# Patient Record
Sex: Male | Born: 1935 | Race: White | Hispanic: No | Marital: Married | Smoking: Former smoker
Health system: Southern US, Community
[De-identification: ages and names within clinical notes are randomized; demographics above are authoritative.]

## PROBLEM LIST (undated history)

## (undated) DIAGNOSIS — Z5320 Procedure and treatment not carried out because of patient's decision for unspecified reasons: Secondary | ICD-10-CM

## (undated) DIAGNOSIS — E119 Type 2 diabetes mellitus without complications: Secondary | ICD-10-CM

## (undated) DIAGNOSIS — E1169 Type 2 diabetes mellitus with other specified complication: Secondary | ICD-10-CM

## (undated) DIAGNOSIS — K5903 Drug induced constipation: Secondary | ICD-10-CM

## (undated) DIAGNOSIS — K219 Gastro-esophageal reflux disease without esophagitis: Secondary | ICD-10-CM

## (undated) DIAGNOSIS — R0683 Snoring: Secondary | ICD-10-CM

## (undated) DIAGNOSIS — N400 Enlarged prostate without lower urinary tract symptoms: Secondary | ICD-10-CM

## (undated) DIAGNOSIS — A809 Acute poliomyelitis, unspecified: Secondary | ICD-10-CM

## (undated) DIAGNOSIS — Z9119 Patient's noncompliance with other medical treatment and regimen: Secondary | ICD-10-CM

## (undated) DIAGNOSIS — M1990 Unspecified osteoarthritis, unspecified site: Secondary | ICD-10-CM

## (undated) DIAGNOSIS — S83209A Unspecified tear of unspecified meniscus, current injury, unspecified knee, initial encounter: Secondary | ICD-10-CM

## (undated) DIAGNOSIS — Z9289 Personal history of other medical treatment: Secondary | ICD-10-CM

## (undated) DIAGNOSIS — H269 Unspecified cataract: Secondary | ICD-10-CM

## (undated) DIAGNOSIS — E039 Hypothyroidism, unspecified: Secondary | ICD-10-CM

## (undated) DIAGNOSIS — I10 Essential (primary) hypertension: Secondary | ICD-10-CM

## (undated) DIAGNOSIS — R351 Nocturia: Secondary | ICD-10-CM

## (undated) DIAGNOSIS — E785 Hyperlipidemia, unspecified: Secondary | ICD-10-CM

## (undated) HISTORY — PX: TONSILLECTOMY: SUR1361

## (undated) HISTORY — PX: MENISCECTOMY: SHX123

## (undated) HISTORY — DX: Hypothyroidism, unspecified: E03.9

## (undated) HISTORY — DX: Unspecified tear of unspecified meniscus, current injury, unspecified knee, initial encounter: S83.209A

## (undated) HISTORY — DX: Personal history of other medical treatment: Z92.89

## (undated) HISTORY — DX: Acute poliomyelitis, unspecified: A80.9

## (undated) HISTORY — DX: Unspecified cataract: H26.9

## (undated) HISTORY — DX: Hyperlipidemia, unspecified: E78.5

## (undated) HISTORY — DX: Snoring: R06.83

## (undated) HISTORY — PX: VASECTOMY: SHX75

## (undated) HISTORY — PX: LIPOMA EXCISION: SHX5283

## (undated) HISTORY — PX: COLONOSCOPY: SHX174

---
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
## 1957-03-01 HISTORY — PX: WRIST SURGERY: SHX841

## 1993-03-01 HISTORY — PX: OTHER SURGICAL HISTORY: SHX169

## 1994-03-01 HISTORY — PX: UVULECTOMY: SHX2631

## 1998-06-10 ENCOUNTER — Ambulatory Visit (HOSPITAL_BASED_OUTPATIENT_CLINIC_OR_DEPARTMENT_OTHER): Admission: RE | Admit: 1998-06-10 | Discharge: 1998-06-10 | Payer: Self-pay | Admitting: Orthopedic Surgery

## 2000-03-01 HISTORY — PX: INGUINAL HERNIA REPAIR: SUR1180

## 2001-03-01 HISTORY — PX: EYE SURGERY: SHX253

## 2001-09-29 HISTORY — PX: CARDIAC CATHETERIZATION: SHX172

## 2001-11-10 ENCOUNTER — Ambulatory Visit (HOSPITAL_COMMUNITY): Admission: RE | Admit: 2001-11-10 | Discharge: 2001-11-10 | Payer: Self-pay | Admitting: Cardiovascular Disease

## 2002-04-03 LAB — HM COLONOSCOPY: HM Colonoscopy: NORMAL

## 2002-05-28 ENCOUNTER — Ambulatory Visit (HOSPITAL_COMMUNITY): Admission: RE | Admit: 2002-05-28 | Discharge: 2002-05-28 | Payer: Self-pay | Admitting: *Deleted

## 2002-05-28 ENCOUNTER — Encounter (INDEPENDENT_AMBULATORY_CARE_PROVIDER_SITE_OTHER): Payer: Self-pay | Admitting: Specialist

## 2003-11-11 ENCOUNTER — Ambulatory Visit (HOSPITAL_BASED_OUTPATIENT_CLINIC_OR_DEPARTMENT_OTHER): Admission: RE | Admit: 2003-11-11 | Discharge: 2003-11-11 | Payer: Self-pay | Admitting: *Deleted

## 2004-01-07 ENCOUNTER — Ambulatory Visit: Payer: Self-pay | Admitting: Internal Medicine

## 2004-01-20 ENCOUNTER — Ambulatory Visit: Payer: Self-pay | Admitting: Internal Medicine

## 2004-03-12 ENCOUNTER — Ambulatory Visit: Payer: Self-pay | Admitting: Internal Medicine

## 2004-03-27 ENCOUNTER — Ambulatory Visit (HOSPITAL_BASED_OUTPATIENT_CLINIC_OR_DEPARTMENT_OTHER): Admission: RE | Admit: 2004-03-27 | Discharge: 2004-03-27 | Payer: Self-pay | Admitting: *Deleted

## 2004-04-23 ENCOUNTER — Ambulatory Visit (HOSPITAL_COMMUNITY): Admission: RE | Admit: 2004-04-23 | Discharge: 2004-04-23 | Payer: Self-pay | Admitting: Orthopedic Surgery

## 2004-07-13 ENCOUNTER — Ambulatory Visit: Payer: Self-pay | Admitting: Internal Medicine

## 2004-07-16 ENCOUNTER — Ambulatory Visit: Payer: Self-pay | Admitting: Internal Medicine

## 2004-10-21 ENCOUNTER — Ambulatory Visit: Payer: Self-pay | Admitting: Internal Medicine

## 2004-11-23 ENCOUNTER — Encounter: Admission: RE | Admit: 2004-11-23 | Discharge: 2004-11-23 | Payer: Self-pay | Admitting: Rheumatology

## 2004-12-07 ENCOUNTER — Ambulatory Visit: Payer: Self-pay | Admitting: Internal Medicine

## 2005-02-19 ENCOUNTER — Ambulatory Visit: Payer: Self-pay | Admitting: Internal Medicine

## 2005-04-13 ENCOUNTER — Ambulatory Visit: Payer: Self-pay | Admitting: Internal Medicine

## 2005-04-29 ENCOUNTER — Ambulatory Visit: Payer: Self-pay | Admitting: Internal Medicine

## 2005-05-10 ENCOUNTER — Ambulatory Visit: Payer: Self-pay | Admitting: Internal Medicine

## 2005-05-25 ENCOUNTER — Ambulatory Visit: Payer: Self-pay | Admitting: Internal Medicine

## 2005-07-30 ENCOUNTER — Ambulatory Visit: Payer: Self-pay | Admitting: Internal Medicine

## 2005-09-08 ENCOUNTER — Ambulatory Visit: Payer: Self-pay | Admitting: Internal Medicine

## 2006-03-17 ENCOUNTER — Ambulatory Visit: Payer: Self-pay | Admitting: Internal Medicine

## 2006-03-17 LAB — CONVERTED CEMR LAB
ALT: 31 units/L (ref 0–40)
AST: 26 units/L (ref 0–37)
Cholesterol: 128 mg/dL (ref 0–200)
HDL: 40.1 mg/dL (ref >39.0)
Hgb A1c MFr Bld: 6.4 % — ABNORMAL HIGH (ref 4.6–6.0)
LDL Cholesterol: 67 mg/dL (ref 0–99)
Total CHOL/HDL Ratio: 3.2
Triglycerides: 103 mg/dL (ref 0–149)
VLDL: 21 mg/dL (ref 0–40)

## 2006-03-21 ENCOUNTER — Ambulatory Visit: Payer: Self-pay | Admitting: Internal Medicine

## 2006-03-29 ENCOUNTER — Ambulatory Visit: Payer: Self-pay | Admitting: Internal Medicine

## 2006-04-20 DIAGNOSIS — E039 Hypothyroidism, unspecified: Secondary | ICD-10-CM | POA: Insufficient documentation

## 2006-07-22 ENCOUNTER — Ambulatory Visit: Payer: Self-pay | Admitting: Internal Medicine

## 2006-07-22 LAB — CONVERTED CEMR LAB
BUN: 8 mg/dL (ref 6–23)
Cholesterol: 148 mg/dL (ref 0–200)
Creatinine, Ser: 1 mg/dL (ref 0.4–1.5)
Creatinine,U: 44.3 mg/dL
HDL: 40.5 mg/dL (ref >39.0)
Hgb A1c MFr Bld: 6.6 % — ABNORMAL HIGH (ref 4.6–6.0)
LDL Cholesterol: 82 mg/dL (ref 0–99)
Microalb Creat Ratio: 4.5 mg/g (ref 0.0–30.0)
Microalb, Ur: 0.2 mg/dL (ref 0.0–1.9)
TSH: 1.65 microintl units/mL (ref 0.35–5.50)
Total CHOL/HDL Ratio: 3.7
Triglycerides: 128 mg/dL (ref 0–149)
VLDL: 26 mg/dL (ref 0–40)

## 2006-07-27 ENCOUNTER — Ambulatory Visit (HOSPITAL_BASED_OUTPATIENT_CLINIC_OR_DEPARTMENT_OTHER): Admission: RE | Admit: 2006-07-27 | Discharge: 2006-07-27 | Payer: Self-pay | Admitting: Urology

## 2006-07-31 HISTORY — PX: PROSTATE SURGERY: SHX751

## 2006-08-23 ENCOUNTER — Ambulatory Visit: Payer: Self-pay | Admitting: Internal Medicine

## 2006-09-27 ENCOUNTER — Encounter: Payer: Self-pay | Admitting: Internal Medicine

## 2006-10-18 ENCOUNTER — Ambulatory Visit: Payer: Self-pay | Admitting: Internal Medicine

## 2006-10-18 LAB — CONVERTED CEMR LAB: Hgb A1c MFr Bld: 6.2 % — ABNORMAL HIGH (ref 4.6–6.0)

## 2006-10-19 ENCOUNTER — Encounter (INDEPENDENT_AMBULATORY_CARE_PROVIDER_SITE_OTHER): Payer: Self-pay | Admitting: *Deleted

## 2006-10-20 ENCOUNTER — Encounter: Payer: Self-pay | Admitting: Internal Medicine

## 2006-10-25 ENCOUNTER — Ambulatory Visit: Payer: Self-pay | Admitting: Internal Medicine

## 2006-10-25 DIAGNOSIS — T887XXA Unspecified adverse effect of drug or medicament, initial encounter: Secondary | ICD-10-CM | POA: Insufficient documentation

## 2006-10-31 LAB — CONVERTED CEMR LAB
ALT: 22 units/L (ref 0–53)
AST: 22 units/L (ref 0–37)
Albumin: 4.5 g/dL (ref 3.5–5.2)
Alkaline Phosphatase: 90 units/L (ref 39–117)
Bilirubin, Direct: 0.1 mg/dL (ref 0.0–0.3)
Total Bilirubin: 0.8 mg/dL (ref 0.3–1.2)
Total Protein: 8.2 g/dL (ref 6.0–8.3)

## 2006-11-01 ENCOUNTER — Encounter (INDEPENDENT_AMBULATORY_CARE_PROVIDER_SITE_OTHER): Payer: Self-pay | Admitting: *Deleted

## 2006-11-29 ENCOUNTER — Encounter: Payer: Self-pay | Admitting: Internal Medicine

## 2006-11-30 ENCOUNTER — Encounter: Payer: Self-pay | Admitting: Internal Medicine

## 2006-12-14 ENCOUNTER — Telehealth (INDEPENDENT_AMBULATORY_CARE_PROVIDER_SITE_OTHER): Payer: Self-pay | Admitting: *Deleted

## 2007-03-23 ENCOUNTER — Telehealth (INDEPENDENT_AMBULATORY_CARE_PROVIDER_SITE_OTHER): Payer: Self-pay | Admitting: *Deleted

## 2007-04-05 ENCOUNTER — Ambulatory Visit: Payer: Self-pay | Admitting: Internal Medicine

## 2007-04-06 ENCOUNTER — Ambulatory Visit: Payer: Self-pay | Admitting: Internal Medicine

## 2007-04-06 DIAGNOSIS — T7841XA Arthus phenomenon, initial encounter: Secondary | ICD-10-CM | POA: Insufficient documentation

## 2007-04-14 ENCOUNTER — Encounter (INDEPENDENT_AMBULATORY_CARE_PROVIDER_SITE_OTHER): Payer: Self-pay | Admitting: *Deleted

## 2007-04-14 LAB — CONVERTED CEMR LAB
ALT: 21 units/L (ref 0–53)
AST: 23 units/L (ref 0–37)
Albumin: 4 g/dL (ref 3.5–5.2)
Alkaline Phosphatase: 75 units/L (ref 39–117)
BUN: 10 mg/dL (ref 6–23)
Bilirubin, Direct: 0.1 mg/dL (ref 0.0–0.3)
Cholesterol: 127 mg/dL (ref 0–200)
Creatinine, Ser: 1.1 mg/dL (ref 0.4–1.5)
Creatinine,U: 97.6 mg/dL
HDL: 35.8 mg/dL — ABNORMAL LOW (ref >39.0)
Hgb A1c MFr Bld: 6.3 % — ABNORMAL HIGH (ref 4.6–6.0)
LDL Cholesterol: 65 mg/dL (ref 0–99)
Microalb Creat Ratio: 12.3 mg/g (ref 0.0–30.0)
Microalb, Ur: 1.2 mg/dL (ref 0.0–1.9)
Potassium: 5.6 meq/L — ABNORMAL HIGH (ref 3.5–5.1)
TSH: 1.74 microintl units/mL (ref 0.35–5.50)
Total Bilirubin: 1.1 mg/dL (ref 0.3–1.2)
Total CHOL/HDL Ratio: 3.5
Total Protein: 7.2 g/dL (ref 6.0–8.3)
Triglycerides: 129 mg/dL (ref 0–149)
VLDL: 26 mg/dL (ref 0–40)

## 2007-04-18 DIAGNOSIS — N400 Enlarged prostate without lower urinary tract symptoms: Secondary | ICD-10-CM | POA: Insufficient documentation

## 2007-04-19 ENCOUNTER — Ambulatory Visit: Payer: Self-pay | Admitting: Internal Medicine

## 2007-04-19 DIAGNOSIS — Z9189 Other specified personal risk factors, not elsewhere classified: Secondary | ICD-10-CM | POA: Insufficient documentation

## 2007-04-19 DIAGNOSIS — E1169 Type 2 diabetes mellitus with other specified complication: Secondary | ICD-10-CM

## 2007-04-19 DIAGNOSIS — E119 Type 2 diabetes mellitus without complications: Secondary | ICD-10-CM | POA: Insufficient documentation

## 2007-04-19 DIAGNOSIS — E785 Hyperlipidemia, unspecified: Secondary | ICD-10-CM

## 2007-04-19 HISTORY — DX: Hyperlipidemia, unspecified: E11.69

## 2007-07-11 ENCOUNTER — Telehealth (INDEPENDENT_AMBULATORY_CARE_PROVIDER_SITE_OTHER): Payer: Self-pay | Admitting: *Deleted

## 2007-07-18 ENCOUNTER — Ambulatory Visit: Payer: Self-pay | Admitting: Internal Medicine

## 2007-07-19 ENCOUNTER — Ambulatory Visit: Payer: Self-pay | Admitting: Internal Medicine

## 2007-07-19 LAB — CONVERTED CEMR LAB
ALT: 20 units/L (ref 0–53)
AST: 24 units/L (ref 0–37)
Albumin: 3.9 g/dL (ref 3.5–5.2)
Alkaline Phosphatase: 90 units/L (ref 39–117)
BUN: 10 mg/dL (ref 6–23)
Bilirubin, Direct: 0.1 mg/dL (ref 0.0–0.3)
Creatinine, Ser: 1 mg/dL (ref 0.4–1.5)
Hgb A1c MFr Bld: 6.2 % — ABNORMAL HIGH (ref 4.6–6.0)
Potassium: 5.1 meq/L (ref 3.5–5.1)
Total Bilirubin: 0.6 mg/dL (ref 0.3–1.2)
Total Protein: 7.1 g/dL (ref 6.0–8.3)

## 2007-07-20 ENCOUNTER — Encounter: Payer: Self-pay | Admitting: Internal Medicine

## 2007-08-18 ENCOUNTER — Encounter: Payer: Self-pay | Admitting: Internal Medicine

## 2007-08-22 ENCOUNTER — Telehealth (INDEPENDENT_AMBULATORY_CARE_PROVIDER_SITE_OTHER): Payer: Self-pay | Admitting: *Deleted

## 2007-08-31 ENCOUNTER — Telehealth: Payer: Self-pay | Admitting: Internal Medicine

## 2007-09-08 ENCOUNTER — Encounter: Payer: Self-pay | Admitting: Internal Medicine

## 2007-10-09 ENCOUNTER — Ambulatory Visit: Payer: Self-pay | Admitting: Internal Medicine

## 2007-10-14 LAB — CONVERTED CEMR LAB: Hgb A1c MFr Bld: 6.6 % — ABNORMAL HIGH (ref 4.6–6.0)

## 2007-10-16 ENCOUNTER — Encounter (INDEPENDENT_AMBULATORY_CARE_PROVIDER_SITE_OTHER): Payer: Self-pay | Admitting: *Deleted

## 2007-11-02 ENCOUNTER — Ambulatory Visit: Payer: Self-pay | Admitting: Internal Medicine

## 2007-11-23 ENCOUNTER — Encounter: Payer: Self-pay | Admitting: Internal Medicine

## 2008-01-18 ENCOUNTER — Encounter: Payer: Self-pay | Admitting: Internal Medicine

## 2008-02-08 ENCOUNTER — Ambulatory Visit: Payer: Self-pay | Admitting: Internal Medicine

## 2008-02-11 LAB — CONVERTED CEMR LAB
Creatinine,U: 85.4 mg/dL
Hgb A1c MFr Bld: 6.4 % — ABNORMAL HIGH (ref 4.6–6.0)
Microalb Creat Ratio: 2.3 mg/g (ref 0.0–30.0)
Microalb, Ur: 0.2 mg/dL (ref 0.0–1.9)

## 2008-02-12 ENCOUNTER — Encounter (INDEPENDENT_AMBULATORY_CARE_PROVIDER_SITE_OTHER): Payer: Self-pay | Admitting: *Deleted

## 2008-02-19 ENCOUNTER — Telehealth: Payer: Self-pay | Admitting: Internal Medicine

## 2008-05-16 ENCOUNTER — Encounter: Payer: Self-pay | Admitting: Internal Medicine

## 2008-06-06 ENCOUNTER — Encounter: Payer: Self-pay | Admitting: Internal Medicine

## 2008-06-12 ENCOUNTER — Ambulatory Visit: Payer: Self-pay | Admitting: Internal Medicine

## 2008-06-12 DIAGNOSIS — L570 Actinic keratosis: Secondary | ICD-10-CM | POA: Insufficient documentation

## 2008-06-18 ENCOUNTER — Telehealth (INDEPENDENT_AMBULATORY_CARE_PROVIDER_SITE_OTHER): Payer: Self-pay | Admitting: *Deleted

## 2008-06-18 LAB — CONVERTED CEMR LAB
BUN: 13 mg/dL (ref 6–23)
Creatinine, Ser: 1 mg/dL (ref 0.4–1.5)
Hgb A1c MFr Bld: 6.4 % (ref 4.6–6.5)
Potassium: 5.3 meq/L — ABNORMAL HIGH (ref 3.5–5.1)
TSH: 2.15 microintl units/mL (ref 0.35–5.50)

## 2008-07-12 ENCOUNTER — Encounter: Payer: Self-pay | Admitting: Internal Medicine

## 2008-10-31 ENCOUNTER — Encounter: Payer: Self-pay | Admitting: Internal Medicine

## 2008-11-05 ENCOUNTER — Telehealth: Payer: Self-pay | Admitting: Internal Medicine

## 2008-11-08 ENCOUNTER — Ambulatory Visit: Payer: Self-pay | Admitting: Internal Medicine

## 2008-11-10 LAB — CONVERTED CEMR LAB
BUN: 13 mg/dL (ref 6–23)
Creatinine, Ser: 1.1 mg/dL (ref 0.4–1.5)
Creatinine,U: 260.2 mg/dL
Hgb A1c MFr Bld: 6.3 % (ref 4.6–6.5)
Microalb Creat Ratio: 2.7 mg/g (ref 0.0–30.0)
Microalb, Ur: 0.7 mg/dL (ref 0.0–1.9)
Potassium: 4.6 meq/L (ref 3.5–5.1)
TSH: 2.18 microintl units/mL (ref 0.35–5.50)

## 2008-11-11 ENCOUNTER — Encounter (INDEPENDENT_AMBULATORY_CARE_PROVIDER_SITE_OTHER): Payer: Self-pay | Admitting: *Deleted

## 2008-11-19 ENCOUNTER — Ambulatory Visit: Payer: Self-pay | Admitting: Internal Medicine

## 2008-12-06 ENCOUNTER — Encounter: Payer: Self-pay | Admitting: Internal Medicine

## 2009-01-03 ENCOUNTER — Encounter: Payer: Self-pay | Admitting: Internal Medicine

## 2009-02-07 ENCOUNTER — Telehealth (INDEPENDENT_AMBULATORY_CARE_PROVIDER_SITE_OTHER): Payer: Self-pay | Admitting: *Deleted

## 2009-02-11 ENCOUNTER — Ambulatory Visit: Payer: Self-pay | Admitting: Internal Medicine

## 2009-02-16 LAB — CONVERTED CEMR LAB: Hgb A1c MFr Bld: 6.3 % (ref 4.6–6.5)

## 2009-02-17 ENCOUNTER — Encounter (INDEPENDENT_AMBULATORY_CARE_PROVIDER_SITE_OTHER): Payer: Self-pay | Admitting: *Deleted

## 2009-03-01 HISTORY — PX: BACK SURGERY: SHX140

## 2009-04-03 ENCOUNTER — Encounter: Payer: Self-pay | Admitting: Internal Medicine

## 2009-04-07 ENCOUNTER — Telehealth (INDEPENDENT_AMBULATORY_CARE_PROVIDER_SITE_OTHER): Payer: Self-pay | Admitting: *Deleted

## 2009-04-14 ENCOUNTER — Encounter: Payer: Self-pay | Admitting: Internal Medicine

## 2009-04-15 ENCOUNTER — Telehealth (INDEPENDENT_AMBULATORY_CARE_PROVIDER_SITE_OTHER): Payer: Self-pay | Admitting: *Deleted

## 2009-04-16 ENCOUNTER — Ambulatory Visit: Payer: Self-pay | Admitting: Internal Medicine

## 2009-04-17 ENCOUNTER — Telehealth (INDEPENDENT_AMBULATORY_CARE_PROVIDER_SITE_OTHER): Payer: Self-pay | Admitting: *Deleted

## 2009-04-21 ENCOUNTER — Ambulatory Visit: Payer: Self-pay | Admitting: Internal Medicine

## 2009-04-22 ENCOUNTER — Ambulatory Visit: Payer: Self-pay | Admitting: Internal Medicine

## 2009-04-22 LAB — CONVERTED CEMR LAB
ALT: 19 units/L (ref 0–53)
AST: 21 units/L (ref 0–37)
Albumin: 4 g/dL (ref 3.5–5.2)
Alkaline Phosphatase: 56 units/L (ref 39–117)
Basophils Absolute: 0 10*3/uL (ref 0.0–0.1)
Basophils Relative: 0.5 % (ref 0.0–3.0)
Bilirubin Urine: NEGATIVE
Bilirubin, Direct: 0.2 mg/dL (ref 0.0–0.3)
Blood in Urine, dipstick: NEGATIVE
Cholesterol: 138 mg/dL (ref 0–200)
Creatinine, Ser: 0.9 mg/dL (ref 0.4–1.5)
Eosinophils Absolute: 0.2 10*3/uL (ref 0.0–0.7)
Eosinophils Relative: 3.3 % (ref 0.0–5.0)
Glucose, Bld: 112 mg/dL — ABNORMAL HIGH (ref 70–99)
Glucose, Urine, Semiquant: NEGATIVE
HCT: 39.2 % (ref 39.0–52.0)
HDL: 48.7 mg/dL (ref >39.00)
Hemoglobin: 13.1 g/dL (ref 13.0–17.0)
Ketones, urine, test strip: NEGATIVE
LDL Cholesterol: 67 mg/dL (ref 0–99)
Lymphocytes Relative: 24.7 % (ref 12.0–46.0)
Lymphs Abs: 1.2 10*3/uL (ref 0.7–4.0)
MCHC: 33.3 g/dL (ref 30.0–36.0)
MCV: 91 fL (ref 78.0–100.0)
Monocytes Absolute: 0.4 10*3/uL (ref 0.1–1.0)
Monocytes Relative: 8 % (ref 3.0–12.0)
Neutro Abs: 3.2 10*3/uL (ref 1.4–7.7)
Neutrophils Relative %: 63.5 % (ref 43.0–77.0)
Nitrite: NEGATIVE
Platelets: 223 10*3/uL (ref 150.0–400.0)
Protein, U semiquant: NEGATIVE
RBC: 4.3 M/uL (ref 4.22–5.81)
RDW: 11.9 % (ref 11.5–14.6)
Specific Gravity, Urine: 1.01
Total Bilirubin: 0.5 mg/dL (ref 0.3–1.2)
Total CHOL/HDL Ratio: 3
Total Protein: 7.2 g/dL (ref 6.0–8.3)
Triglycerides: 111 mg/dL (ref 0.0–149.0)
Urobilinogen, UA: 0.2
VLDL: 22.2 mg/dL (ref 0.0–40.0)
WBC Urine, dipstick: NEGATIVE
WBC: 5 10*3/uL (ref 4.5–10.5)
pH: 6

## 2009-04-29 ENCOUNTER — Ambulatory Visit (HOSPITAL_COMMUNITY): Admission: RE | Admit: 2009-04-29 | Discharge: 2009-04-30 | Payer: Self-pay | Admitting: Neurosurgery

## 2009-04-29 ENCOUNTER — Encounter: Payer: Self-pay | Admitting: Internal Medicine

## 2009-05-14 ENCOUNTER — Encounter: Payer: Self-pay | Admitting: Internal Medicine

## 2009-05-29 ENCOUNTER — Telehealth (INDEPENDENT_AMBULATORY_CARE_PROVIDER_SITE_OTHER): Payer: Self-pay | Admitting: *Deleted

## 2009-06-02 ENCOUNTER — Encounter: Payer: Self-pay | Admitting: Internal Medicine

## 2009-06-25 ENCOUNTER — Encounter: Payer: Self-pay | Admitting: Internal Medicine

## 2009-08-28 ENCOUNTER — Ambulatory Visit: Payer: Self-pay | Admitting: Internal Medicine

## 2009-08-29 LAB — CONVERTED CEMR LAB
Creatinine,U: 80.5 mg/dL
Hgb A1c MFr Bld: 6.4 % (ref 4.6–6.5)
Microalb Creat Ratio: 1.5 mg/g (ref 0.0–30.0)
Microalb, Ur: 1.2 mg/dL (ref 0.0–1.9)

## 2009-10-21 ENCOUNTER — Telehealth (INDEPENDENT_AMBULATORY_CARE_PROVIDER_SITE_OTHER): Payer: Self-pay | Admitting: *Deleted

## 2009-10-27 ENCOUNTER — Encounter: Payer: Self-pay | Admitting: Internal Medicine

## 2009-12-12 ENCOUNTER — Encounter: Payer: Self-pay | Admitting: Internal Medicine

## 2009-12-14 ENCOUNTER — Emergency Department (HOSPITAL_COMMUNITY): Admission: EM | Admit: 2009-12-14 | Discharge: 2009-12-14 | Payer: Self-pay | Admitting: Family Medicine

## 2009-12-15 ENCOUNTER — Encounter: Payer: Self-pay | Admitting: Internal Medicine

## 2010-01-12 ENCOUNTER — Telehealth (INDEPENDENT_AMBULATORY_CARE_PROVIDER_SITE_OTHER): Payer: Self-pay | Admitting: *Deleted

## 2010-01-29 ENCOUNTER — Telehealth (INDEPENDENT_AMBULATORY_CARE_PROVIDER_SITE_OTHER): Payer: Self-pay | Admitting: *Deleted

## 2010-02-02 ENCOUNTER — Ambulatory Visit: Payer: Self-pay | Admitting: Internal Medicine

## 2010-02-03 LAB — CONVERTED CEMR LAB
Hgb A1c MFr Bld: 6.6 % — ABNORMAL HIGH (ref 4.6–6.5)
Potassium: 5.5 meq/L — ABNORMAL HIGH (ref 3.5–5.1)
Sodium: 136 meq/L (ref 135–145)
TSH: 2.09 microintl units/mL (ref 0.35–5.50)

## 2010-02-12 ENCOUNTER — Ambulatory Visit: Payer: Self-pay | Admitting: Internal Medicine

## 2010-02-13 DIAGNOSIS — E876 Hypokalemia: Secondary | ICD-10-CM | POA: Insufficient documentation

## 2010-02-24 ENCOUNTER — Encounter: Payer: Self-pay | Admitting: Internal Medicine

## 2010-03-01 DIAGNOSIS — Z9289 Personal history of other medical treatment: Secondary | ICD-10-CM

## 2010-03-01 HISTORY — DX: Personal history of other medical treatment: Z92.89

## 2010-03-01 HISTORY — PX: JOINT REPLACEMENT: SHX530

## 2010-03-11 ENCOUNTER — Telehealth (INDEPENDENT_AMBULATORY_CARE_PROVIDER_SITE_OTHER): Payer: Self-pay | Admitting: *Deleted

## 2010-03-26 ENCOUNTER — Encounter: Payer: Self-pay | Admitting: Internal Medicine

## 2010-04-02 NOTE — Letter (Signed)
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

## 2010-04-02 NOTE — Letter (Signed)
Summary: [facility]  [facility]   Imported By: Lanelle Bal 02/02/2008 09:28:30  _____________________________________________________________________  External Attachment:    Type:   Image     Comment:   External Document

## 2010-04-02 NOTE — Letter (Signed)
Summary: [facility]  [facility]   Imported By: Lanelle Bal 05/31/2009 10:42:31  _____________________________________________________________________  External Attachment:    Type:   Image     Comment:   External Document

## 2010-04-02 NOTE — Consult Note (Signed)
Summary: [facility]  [facility]   Imported By: Lanelle Bal 09/15/2007 11:33:26  _____________________________________________________________________  External Attachment:    Type:   Image     Comment:   External Document  Appended Document: [facility] Dr Corliss Skains : Hyalgan injections; osteoporosis

## 2010-04-02 NOTE — Assessment & Plan Note (Signed)
Summary: DISCUSS LABS/CDJ   Vital Signs:  Patient Profile:   77 Years Old Male Height:     71 inches Weight:      198.2 pounds Pulse rate:   57 / minute Resp:     12 per minute BP sitting:   118 / 64  (left arm) Cuff size:   large  Vitals Entered By: Shonna Chock (November 02, 2007 2:45 PM)                 Chief Complaint:  1.) FOLLOW-UP ON LABS 2.) LOOK @ SPOT ON RIGHT ARM X 5 DAYS 3.) REFILL SYNTHROID AND METFORMIN 4.) ? WHEN SHOULD LIPIDS BE RECHECKED and Type 2 diabetes mellitus follow-up.  History of Present Illness: The A1c was 6.6% on 10/09/07; FBS average 110 & 2 hr pc < 140.No steroid injections ,but he received Synvas.He has name of Podiatrist, but has not been. Now exercising 5X /week w/o symptoms  Type 2 Diabetes Mellitus Follow-Up      This is a 76 year old man who presents for Type 2 diabetes mellitus follow-up.  The patient denies polyuria, polydipsia, blurred vision, self managed hypoglycemia, hypoglycemia requiring help, weight loss, weight gain, and numbness of extremities.  The patient denies the following symptoms: neuropathic pain, chest pain, vomiting, orthostatic symptoms, poor wound healing, intermittent claudication, vision loss, and foot ulcer.  Since the last visit the patient reports good dietary compliance, compliance with medications, exercising regularly, and monitoring blood glucose.  The patient has been measuring capillary blood glucose before breakfast, before lunch, and after dinner.  Since the last visit, the patient reports having had eye care by an ophthalmologist and no foot care.  Off [facility] as of several months ago.    Current Allergies (reviewed today): ! * ANTIHISTAMINES ! ZOCOR      Physical Exam  General:     well-nourished,in no acute distress; alert,appropriate and cooperative throughout examination Heart:     Normal rate and regular rhythm. S1 and S2 normal without gallop, murmur, click, rub .S4 with slurring Pulses:    R and L carotid,radial,dorsalis pedis and posterior tibial pulses are full and equal bilaterally Skin:     Bruise Rforearm; pigmented flat lesions bilat on arms ("unchanged for yrs, but wife concerned")    Impression & Recommendations:  Problem # 1:  DIABETES MELLITUS, TYPE II (ICD-250.00)  His updated medication list for this problem includes:    Adult Aspirin Low Strength 81 Mg Tbdp (Aspirin) .Marland Kitchen... 1 by mouth qd    Metformin Hcl 500 Mg Tb24 (Metformin hcl) .Marland Kitchen... 1 by mouth with the largest meal   Problem # 2:  HYPOTHYROIDISM (ICD-244.9)  His updated medication list for this problem includes:    Synthroid 150 Mcg Tabs (Levothyroxine sodium) .Marland Kitchen... 1 by mouth qd   Complete Medication List: 1)  Synthroid 150 Mcg Tabs (Levothyroxine sodium) .Marland Kitchen.. 1 by mouth qd 2)  Flomax 0.4 Mg Cp24 (Tamsulosin hcl) .Marland Kitchen.. 1 by mouth qd 3)  Adult Aspirin Low Strength 81 Mg Tbdp (Aspirin) .Marland Kitchen.. 1 by mouth qd 4)  Crestor 5 Mg Tabs (Rosuvastatin calcium) .Marland Kitchen.. 1 by mouth qd 5)  Celebrex 200 Mg Caps (Celecoxib) .Marland Kitchen.. 1 by mouth once daily prn 6)  Metformin Hcl 500 Mg Tb24 (Metformin hcl) .Marland Kitchen.. 1 by mouth with the largest meal 7)  Precision Xtra Blood Glucose Strp (Glucose blood) .... Check one time daily as directed 8)  Centrum Silver Tabs (Multiple vitamins-minerals) .... Take 1 tablet  by mouth once a day 9)  Cvs Vitamin E 200 Unit Caps (Vitamin e) .... Take 1 capsule by mouth once a day 10)  Vitamin B-12 1000 Mcg Tabs (Cyanocobalamin) .... Take 1 tablet by mouth once a day 11)  Calcium-magnesium-zinc 1000-400-15 Mg Tabs (Calcium-magnesium-zinc) .... Take 1 tablet by mouth once a day 12)  Glucosamine Sulfate 1000 Mg Tabs (Glucosamine sulfate) .... Take 1 tablet by mouth once a day 13)  Eql Fish Oil 1000 Mg Caps (Omega-3 fatty acids) .... Take 1 tablet two times a day by mouth 14)  Potassium 90 Mg  .Marland KitchenMarland Kitchen. 1 by mouth once daily   Patient Instructions: 1)  Please schedule a follow-up appointment in 3  months. 2)  HbgA1C prior to visit, ICD-9:250.00 3)  Urine Microalbumin prior to visit, ICD-9:250.00   Prescriptions: CRESTOR 5 MG  TABS (ROSUVASTATIN CALCIUM) 1 by mouth qd  #90 x 1   Entered and Authorized by:   Marga Melnick MD   Signed by:   Marga Melnick MD on 11/02/2007   Method used:   Print then Give to Patient   RxID:   [id number] METFORMIN HCL 500 MG  TB24 (METFORMIN HCL) 1 by mouth with the largest meal  #90 x 1   Entered and Authorized by:   Marga Melnick MD   Signed by:   Marga Melnick MD on 11/02/2007   Method used:   Print then Give to Patient   RxID:   [id number] SYNTHROID 150 MCG  TABS (LEVOTHYROXINE SODIUM) 1 by mouth qd  #90 x 1   Entered and Authorized by:   Marga Melnick MD   Signed by:   Marga Melnick MD on 11/02/2007   Method used:   Print then Give to Patient   RxID:   [id number]  ]

## 2010-04-02 NOTE — Letter (Signed)
Summary: [facility]  [facility]   Imported By: Lanelle Bal 05/08/2009 13:57:03  _____________________________________________________________________  External Attachment:    Type:   Image     Comment:   External Document

## 2010-04-02 NOTE — Assessment & Plan Note (Signed)
Summary: 8 WEEK F/U//CA   Vital Signs:  Patient Profile:   77 Years Old Male Weight:      191.25 pounds Pulse rate:   52 / minute Pulse rhythm:   regular BP sitting:   120 / 88  (left arm) Cuff size:   large  Pt. in pain?   no  Vitals Entered By: Wendall Stade (October 25, 2006 10:36 AM)                Chief Complaint:  follow up labs and Type 2 diabetes mellitus follow-up.  History of Present Illness:  Type 2 Diabetes Mellitus Follow-Up      This is a 77 year old man who presents for Type 2 diabetes mellitus follow-up.  FBS 99- 140;pc 160-180; 13 # wt loss since 01/08. Knee issues preclude walking.  The patient reports weight gain, but denies polyuria, polydipsia, blurred vision, self managed hypoglycemia, hypoglycemia requiring help, weight loss, and numbness of extremities.  The patient denies the following symptoms: neuropathic pain, chest pain, vomiting, orthostatic symptoms, poor wound healing, intermittent claudication, vision loss, and foot ulcer.  Since the last visit the patient reports good dietary compliance, compliance with medications, not exercising regularly, and monitoring blood glucose.  The patient has been measuring capillary blood glucose before breakfast and before dinner.  Since the last visit, the patient reports having had eye care by an ophthalmologist and no foot care.   Monitor malfunctioning @ times.  Current Allergies (reviewed today): ! * ANTIHISTAMINES ! ZOCOR Updated/Current Medications (including changes made in today's visit):  SYNTHROID 150 MCG  TABS (LEVOTHYROXINE SODIUM) 1 by mouth qd FLOMAX 0.4 MG  CP24 (TAMSULOSIN HCL) 1 by mouth qd ADULT ASPIRIN LOW STRENGTH 81 MG  TBDP (ASPIRIN) 1 by mouth qd CRESTOR 5 MG  TABS (ROSUVASTATIN CALCIUM)  CELEBREX 200 MG  CAPS (CELECOXIB) 1 by mouth once daily prn METFORMIN HCL 500 MG  TABS (METFORMIN HCL) 1 once daily with largest meal       Physical Exam  General:  Well-developed,well-nourished,in no acute distress; alert,appropriate and cooperative throughout examination Heart:     Normal rate and regular rhythm. S1 and S2 normal without gallop, murmur, click, rub. S4 with slurring Pulses:     dorsalis pedis and posterior tibial pulses are full and equal bilaterally Extremities:     deformed toenails Neurologic:     decreased sensation soles; sensation intact to light touch and sensation intact to pinprick over dorsum feet.   Skin:     rubror of feet    Impression & Recommendations:  Problem # 1:  DIABETES-TYPE 2 (ICD-250.00)  His updated medication list for this problem includes:    Adult Aspirin Low Strength 81 Mg Tbdp (Aspirin) .Marland Kitchen... 1 by mouth qd    Metformin Hcl 500 Mg Tabs (Metformin hcl) .Marland Kitchen... 1 once daily with largest meal   Complete Medication List: 1)  Synthroid 150 Mcg Tabs (Levothyroxine sodium) .Marland Kitchen.. 1 by mouth qd 2)  Flomax 0.4 Mg Cp24 (Tamsulosin hcl) .Marland Kitchen.. 1 by mouth qd 3)  Adult Aspirin Low Strength 81 Mg Tbdp (Aspirin) .Marland Kitchen.. 1 by mouth qd 4)  Crestor 5 Mg Tabs (Rosuvastatin calcium) 5)  Celebrex 200 Mg Caps (Celecoxib) .Marland Kitchen.. 1 by mouth once daily prn 6)  Metformin Hcl 500 Mg Tabs (Metformin hcl) .Marland Kitchen.. 1 once daily with largest meal   Patient Instructions: 1)  Report any new symptoms in feet ; Podiatry consult would be scheduled.    Prescriptions: METFORMIN HCL 500  MG  TABS (METFORMIN HCL) 1 once daily with largest meal  #90 x 1   Entered and Authorized by:   Marga Melnick MD   Signed by:   Marga Melnick MD on 10/25/2006   Method used:   Print then Give to Patient   RxID:   [id number] CRESTOR 5 MG  TABS (ROSUVASTATIN CALCIUM)   #90 x 1   Entered and Authorized by:   Marga Melnick MD   Signed by:   Marga Melnick MD on 10/25/2006   Method used:   Print then Give to Patient   RxID:   [id number] SYNTHROID 150 MCG  TABS (LEVOTHYROXINE SODIUM) 1 by mouth qd  #90 x 1   Entered and Authorized by:   Marga Melnick MD   Signed by:   Marga Melnick MD on 10/25/2006   Method used:   Print then Give to Patient   RxID:   [id number]   Appended Document: Orders Update    Clinical Lists Changes  Orders: Added new Test order of TLB-Hepatic/Liver Function Pnl ([id number]) - Signed

## 2010-04-02 NOTE — Assessment & Plan Note (Signed)
Summary: FLIGHT CPX   Vital Signs:  Patient Profile:   77 Years Old Male Height:     71 inches Weight:      194.38 pounds Pulse rate:   60 / minute Pulse rhythm:   regular Resp:     17 per minute BP sitting:   120 / 80  (left arm) Cuff size:   large  Pt. in pain?   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  History of Present Illness: A1c was 6.3% on Metformin 500 mg once daily; A1c was 6.6% in 5/08 not on Metformin. FBS averaging 100. Two hour pc glucoses average 125. NO  hypoglycemia. Goal  discussed ( A1c < 6.5% w/o aany hypoglycemia)    Current Allergies: ! * ANTIHISTAMINES ! ZOCOR  Past Medical History:    Hypothyroidism    Diabetes mellitus, type II    Hyperlipidemia  Past Surgical History:    Inguinal herniorrhaphy; prostate incision for urinary urgency  by Dr Isabel Caprice 6/08    Tonsillectomy    Vasectomy    Polio as a child (age 54)    Sleep apnea surgery (1996)    Fat nodule removed from back    Cath. - neg. (09/2001)    Right arm & wrist repair- accident    Arthroscopy (05/1998)    Colonoscopy- neg. (04/2002)    Meniscusectomy (12/2005) L knee     Review of Systems  General      Denies chills, fatigue, fever, loss of appetite, malaise, sleep disorder, sweats, weakness, and weight loss.  Eyes      Denies blurring, discharge, double vision, eye irritation, eye pain, halos, itching, light sensitivity, red eye, vision loss-1 eye, and vision loss-both eyes.      Small cataract OD  ENT      Complains of ringing in ears.      Denies decreased hearing, difficulty swallowing, ear discharge, earache, hoarseness, nasal congestion, nosebleeds, postnasal drainage, sinus pressure, and sore throat.  CV      Denies bluish discoloration of lips or nails, chest pain or discomfort, difficulty breathing at night, difficulty breathing while lying down, leg cramps with exertion, palpitations,  shortness of breath with exertion, swelling of feet, and swelling of hands.  Resp      Denies cough, excessive snoring, hypersomnolence, morning headaches, shortness of breath, and sputum productive.  GI      Denies abdominal pain, bloody stools, change in bowel habits, constipation, dark tarry stools, diarrhea, indigestion, nausea, and vomiting.  GU      Denies decreased libido, dysuria, erectile dysfunction, hematuria, and urinary frequency.      Nocturia X 1. He saw Dr Isabel Caprice 9/08; PSA 2.1  MS      Complains of joint pain and stiffness.      Denies joint redness, joint swelling, loss of strength, low back pain, mid back pain, muscle aches, muscle , cramps, muscle weakness, and thoracic pain.      L knee stiff since surgery  Derm      Denies changes in color of skin, changes in nail beds, dryness, excessive perspiration, flushing, hair loss, insect bite(s), lesion(s), poor wound healing, and rash.  Neuro      Denies difficulty with concentration, disturbances in coordination, headaches, memory loss, numbness, poor balance, sensation of room spinning, and tingling.  Psych  Denies anxiety, depression, easily angered, easily tearful, and irritability.  Endo      Denies cold intolerance, excessive hunger, excessive thirst, excessive urination, heat intolerance, polyuria, and weight change.  Heme      Denies abnormal bruising and bleeding.  Allergy      Denies hives or rash, itching eyes, seasonal allergies, and sneezing.   Physical Exam  General:     Well-developed,well-nourished,in no acute distress; alert,appropriate and cooperative throughout examination. Appears younger than age Head:     Normocephalic and atraumatic without obvious abnormalities.Pattern alopecia or balding. Eyes:     No corneal or conjunctival inflammation noted. EOMI. Perrla. Funduscopic exam benign, without hemorrhages, exudates or papilledema. Vision grossly normal. Art narrowing Ears:      External ear exam shows no significant lesions or deformities.  Otoscopic examination reveals clear canals, tympanic membranes are intact bilaterally without bulging, retraction, inflammation or discharge. Hearing is grossly normal bilaterally. Nose:     External nasal examination shows no deformity or inflammation. Nasal mucosa are pink and moist without lesions or exudates. Mouth:     Oral mucosa and oropharynx without lesions or exudates.  Teeth in good repair. Radiation tatoo post palate Neck:     No deformities, masses, or tenderness noted. Lungs:     Normal respiratory effort, chest expands symmetrically. Lungs are clear to auscultation, no crackles or wheezes. Heart:     normal rate, regular rhythm, no gallop, no rub, no JVD, no HJR, and grade 1 /6 systolic murmur.   Abdomen:     Bowel sounds positive,abdomen soft and non-tender without masses, organomegaly or hernias noted. Rectal:     deferred to Dr Isabel Caprice Prostate:     deferred to Dr Isabel Caprice Msk:     No deformity or scoliosis noted of thoracic or lumbar spine.   Pulses:     R and L carotid,radial,dorsalis pedis and posterior tibial pulses are full and equal bilaterally Extremities:     Crepitus knees . Minor DJD hands Neurologic:     alert & oriented X3, strength normal in all extremities, gait normal, and DTRs symmetrical and normal.   Skin:     Intact without suspicious lesions or rashes. Op scars in inguinal area not visable Cervical Nodes:     No lymphadenopathy noted Axillary Nodes:     No palpable lymphadenopathy Psych:     Oriented X3, memory intact for recent and remote, normally interactive, good eye contact, not anxious appearing, and not depressed appearing.      Impression & Recommendations:  Problem # 1:  ROUTINE GENERAL MEDICAL EXAM@HEALTH  CARE FACL (ICD-V70.0) FAA exam Orders: UA Dipstick w/o Micro (manual) (16109)   Problem # 2:  DIABETES MELLITUS, TYPE II (ICD-250.00) W/o complications His  updated medication list for this problem includes:    Adult Aspirin Low Strength 81 Mg Tbdp (Aspirin) .Marland Kitchen... 1 by mouth qd    Metformin Hcl 500 Mg Tb24 (Metformin hcl) .Marland Kitchen... 1 by mouth with the largest meal   Problem # 3:  HYPERLIPIDEMIA (ICD-272.4)  His updated medication list for this problem includes:    Crestor 5 Mg Tabs (Rosuvastatin calcium) .Marland Kitchen... 1 by mouth qd  Orders: UA Dipstick w/o Micro (manual) (60454)   Problem # 4:  HYPOTHYROIDISM (ICD-244.9)  His updated medication list for this problem includes:    Synthroid 150 Mcg Tabs (Levothyroxine sodium) .Marland Kitchen... 1 by mouth qd  Orders: UA Dipstick w/o Micro (manual) (09811)   Problem # 5:  SNORING, HX OF (ICD-V15.89)  resolved  post op  Complete Medication List: 1)  Synthroid 150 Mcg Tabs (Levothyroxine sodium) .Marland Kitchen.. 1 by mouth qd 2)  Flomax 0.4 Mg Cp24 (Tamsulosin hcl) .Marland Kitchen.. 1 by mouth qd 3)  Adult Aspirin Low Strength 81 Mg Tbdp (Aspirin) .Marland Kitchen.. 1 by mouth qd 4)  Crestor 5 Mg Tabs (Rosuvastatin calcium) .Marland Kitchen.. 1 by mouth qd 5)  Celebrex 200 Mg Caps (Celecoxib) .Marland Kitchen.. 1 by mouth once daily prn 6)  Metformin Hcl 500 Mg Tb24 (Metformin hcl) .Marland Kitchen.. 1 by mouth with the largest meal 7)  Precision Xtra Blood Glucose Strp (Glucose blood) .... Check one time daily as directed 8)  Amoxicillin-pot Clavulanate 875-125 Mg Tabs (Amoxicillin-pot clavulanate) .Marland Kitchen.. 1 q 12 with meals 9)  Centrum Silver Tabs (Multiple vitamins-minerals) .... Take 1 tablet by mouth once a day 10)  Cvs Vitamin E 200 Unit Caps (Vitamin e) .... Take 1 capsule by mouth once a day 11)  Vitamin B-12 1000 Mcg Tabs (Cyanocobalamin) .... Take 1 tablet by mouth once a day 12)  Calcium-magnesium-zinc 1000-400-15 Mg Tabs (Calcium-magnesium-zinc) .... Take 1 tablet by mouth once a day 13)  Glucosamine Sulfate 1000 Mg Tabs (Glucosamine sulfate) .... Take 1 tablet by mouth once a day 14)  Eql Fish Oil 1000 Mg Caps (Omega-3 fatty acids) .... Take 1 tablet two times a day by mouth 15)   Potassium 90 Mg  .... Take 1 tablet by mouth two times a day   Patient Instructions: 1)  Please schedule a follow-up appointment in 6 months. 2)  BUN,creat, K+ prior to visit, ICD-9: 995.20 3)  HbgA1C prior to visit, ICD-9:250.00 4)  TSH prior to visit, ICD-9: 244.9. Complete stool cards    Prescriptions: METFORMIN HCL 500 MG  TB24 (METFORMIN HCL) 1 by mouth with the largest meal  #90 x 1   Entered and Authorized by:   Marga Melnick MD   Signed by:   Marga Melnick MD on 04/19/2007   Method used:   Print then Give to Patient   RxID:   [id number]  ]

## 2010-04-02 NOTE — Consult Note (Signed)
Summary: [facility]   Imported By: Lanelle Bal 04/23/2009 08:45:57  _____________________________________________________________________  External Attachment:    Type:   Image     Comment:   External Document

## 2010-04-02 NOTE — Assessment & Plan Note (Signed)
Summary: follow-up on labs per Dr. Sheffield Slider   Vital Signs:  Patient profile:   77 year old male Height:      70 inches Weight:      203.6 pounds BMI:     29.32 Temp:     98.3 degrees F oral Pulse rate:   54 / minute Resp:     16 per minute BP sitting:   164 / 88  (right arm) Cuff size:   large  Vitals Entered By: Shonna Chock (April 22, 2009 4:33 PM) CC: 3rd class flight CPX, Full eye exam completed by Eye Dr. Cooper Render REVIEWED MED LIST, PATIENT AGREED DOSE AND INSTRUCTION CORRECT    CC:  3rd class flight CPX and Full eye exam completed by Eye Dr..  History of Present Illness: Labs reviewed & risks discussed. All are @ goal . Old chart reviewed & FAA requirements defined.   Preventive Screening-Counseling & Management  Caffeine-Diet-Exercise     Does Patient Exercise: yes  Allergies: 1)  ! * Antihistamines 2)  ! 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 - negative 09/2001 Fractured wrist  1959 Arthroscopy  2000 Colonoscopy- negative (04/2002) Meniscusectomy   2004, Dr Thurston Hole  Family History: Father: deceased in  MVA Mother: deceased,CAD, emphysema,diverticulosis Siblings: brother- asthma, bone CA PGM:  DM son:  Sarcoid  Social History: Former Smoker:quit 1968 Alcohol use-yes: socially Retired (23 years in [employer]) Married Regular exercise-yes:walks 18 holes 3X/week  Review of Systems  The patient denies anorexia, fever, weight loss, weight gain, vision loss, decreased hearing, hoarseness, chest pain,  syncope, dyspnea on exertion, peripheral edema, prolonged cough, headaches, hemoptysis, abdominal pain, melena, hematochezia, severe indigestion/heartburn, hematuria, incontinence, suspicious skin lesions, depression, unusual weight change, abnormal bleeding, enlarged lymph nodes, and angioedema.    Physical Exam  General:  Appears younger than age,well-nourished; alert,appropriate and cooperative throughout examination Head:  Normocephalic and atraumatic without obvious abnormalities.  Eyes:  No corneal or conjunctival inflammation noted. EOMI. Perrla. Funduscopic exam  limited by small pupils.  Field of Vision grossly normal. Ears:  External ear exam shows no significant lesions or deformities.  Otoscopic examination reveals clear canals, tympanic membranes are intact bilaterally without bulging, retraction, inflammation or discharge. Hearing is grossly normal bilaterally. Nose:  External nasal examination shows no deformity or inflammation. Nasal mucosa are pink and moist without lesions or exudates. Mouth:  Oral mucosa and oropharynx without lesions or exudates.  Teeth in good repair. Neck:  No deformities, masses, or tenderness noted. Lungs:  Normal respiratory effort, chest expands symmetrically. Lungs are clear to auscultation, no crackles or wheezes. Heart:  Normal rate and regular rhythm. S1 and S2 normal without gallop, murmur, click, rub . S4 Abdomen:  Bowel sounds positive,abdomen soft and non-tender without masses, organomegaly or hernias noted. Genitalia:  Dr Isabel Caprice  seen annually Msk:  No deformity or scoliosis noted of thoracic or lumbar spine.   Pulses:  R and L carotid,radial,dorsalis pedis and posterior tibial pulses are full and equal bilaterally Extremities:  No clubbing, cyanosis, edema, or deformity noted with normal full range of  motion of all joints.  Crepitus of knees  Neurologic:  alert & oriented X3, DTRs symmetrical and normal, finger-to-nose normal, and Romberg  negative.   Skin:  Intact without suspicious lesions or rashes. Benign nevus L chest Cervical Nodes:  No lymphadenopathy noted Axillary Nodes:  No palpable lymphadenopathy Psych:  memory intact for recent and remote, normally interactive, and good eye contact.     Impression & Recommendations:  Problem # 1:  HYPERLIPIDEMIA (ICD-272.4) Lipids @ goal His updated medication list for this problem includes:    Crestor 5 Mg Tabs (Rosuvastatin calcium) .Marland Kitchen... 1 by mouth qd  Problem # 2:  DIABETES MELLITUS, TYPE II (ICD-250.00) Controlled w/o hypoglycemia His updated medication list for this problem includes:    Adult Aspirin Low Strength 81 Mg Tbdp (Aspirin) .Marland Kitchen... 1 by mouth qd    Metformin Hcl 500 Mg Tb24 (Metformin hcl) .Marland Kitchen... 1 by mouth with the largest meal  Problem # 3:  BENIGN PROSTATIC HYPERTROPHY, HX OF (ICD-V13.8) as per Dr Isabel Caprice  Problem # 4:  HYPOTHYROIDISM (ICD-244.9) TSH therapeutic His updated medication list for this problem includes:    Synthroid 150 Mcg Tabs (Levothyroxine sodium) .Marland Kitchen... 1 by mouth qd  Complete Medication List: 1)  Synthroid 150 Mcg Tabs (Levothyroxine sodium) .Marland Kitchen.. 1 by mouth qd 2)  Flomax 0.4 Mg Cp24 (Tamsulosin hcl) .Marland Kitchen.. 1 by mouth qd 3)  Adult Aspirin Low Strength 81 Mg Tbdp (Aspirin) .Marland Kitchen.. 1 by mouth qd 4)  Crestor 5 Mg Tabs (Rosuvastatin calcium) .Marland Kitchen.. 1 by mouth qd 5)  Celebrex 200 Mg Caps (Celecoxib) .Marland Kitchen.. 1 by mouth once daily prn 6)  Metformin Hcl 500 Mg Tb24 (Metformin hcl) .Marland Kitchen.. 1 by mouth with the largest meal 7)  Precision Thin Lancets Misc (Lancets) .... One time daily as directed 8)  Centrum Silver Tabs (Multiple vitamins-minerals) .... Take 1 tablet by mouth once a day 9)  Cvs Vitamin E 200 Unit Caps (Vitamin e) .... Take 1 capsule by mouth once a day 10)  Vitamin B-12 1000 Mcg Tabs (Cyanocobalamin) .... Take 1 tablet by mouth once a day 11)  Glucosamine Sulfate 1000 Mg Tabs (Glucosamine sulfate) .... Take 1 tablet by mouth once a day 12)   Eql Fish Oil 1000 Mg Caps (Omega-3 fatty acids) .... Take 1 tablet two times a day by mouth 13)  Potassium 90 Mg  .Marland KitchenMarland Kitchen. 1 by mouth once daily 14)  Actonel 30 Mg Tabs (Risedronate sodium) .Marland Kitchen.. 1 by mouth monthly 15)  Precision Xtra Blood Glucose Strp (Glucose blood) .... Check one time daily as directed  Other Orders: UA Dipstick w/o Micro (manual) (41660)  Patient Instructions: 1)  HbgA1C & Urine Microalbumin in June 2011. In Dec 2011: BUN,creat, K+;Hepatic Panel ;Lipid Panel ;TSH ;HbgA1C ; 2)  Urine Microalbumin . Prescriptions: CRESTOR 5 MG  TABS (ROSUVASTATIN CALCIUM) 1 by mouth qd  #90 x 3   Entered and Authorized by:   Marga Melnick MD   Signed by:   Marga Melnick MD on 04/22/2009   Method used:   Print then Give to Patient   RxID:   [id number]   Laboratory Results   Urine Tests    Routine Urinalysis   Color: yellow Appearance: Clear Glucose: negative   (Normal Range: Negative) Bilirubin: negative   (Normal Range: Negative) Ketone: negative   (Normal Range: Negative) Spec. Gravity: 1.010   (Normal Range: 1.003-1.035) Blood: negative   (Normal Range: Negative) pH: 6.0   (Normal Range: 5.0-8.0) Protein: negative   (Normal Range:  Negative) Urobilinogen: 0.2   (Normal Range: 0-1) Nitrite: negative   (Normal Range: Negative) Leukocyte Esterace: negative   (Normal Range: Negative)

## 2010-04-02 NOTE — Letter (Signed)
Summary: [facility]  [facility]   Imported By: Freddy Jaksch 07/21/2007 09:59:39  _____________________________________________________________________  External Attachment:    Type:   Image     Comment:   External Document  Appended Document: [facility] Dr Emmit Pomfret: no DM retinopathy

## 2010-04-02 NOTE — Letter (Signed)
Summary: Letter Regarding Approval for Airman Medical Certificate/FAA  Letter Regarding Approval for Airman Medical Certificate/FAA   Imported By: Lanelle Bal 06/11/2009 13:40:45  _____________________________________________________________________  External Attachment:    Type:   Image     Comment:   External Document

## 2010-04-02 NOTE — Letter (Signed)
Summary: External [facility]ence-[facility]  External Correspondence-[facility]   Imported By: Vanessa Swaziland 12/21/2006 09:50:09  _____________________________________________________________________  External Attachment:    Type:   Image     Comment:   External Document

## 2010-04-02 NOTE — Progress Notes (Signed)
Summary: needs office visit to complete faa request  Phone Note Call from Patient Call back at [phone]   Caller: Patient Summary of Call: Pt is calling about a letter that he receive from having his flight physical. He would like to talk to Hardy about this. Initial call taken by: Freddy Jaksch,  Jul 11, 2007 8:40 AM  Follow-up for Phone Call        called patient and he will fax me a copy of letter from faa.Wendall Stade  Jul 11, 2007 10:05 AM   Additional Follow-up for Phone Call Additional follow up Details #1::        THREE PAGE FAX HAS BEEN RECEIVED, WILL TAKE TO [facility] [place] Additional Follow-up by: Jerolyn Shin,  Jul 11, 2007 2:09 PM    Additional Follow-up for Phone Call Additional follow up Details #2::    fax given to Dr. Alwyn Ren.Wendall Stade  Jul 11, 2007 3:19 PM   Additional Follow-up for Phone Call Additional follow up Details #3:: Details for Additional Follow-up Action Taken: The FAA is being extremely cautious. Please make appt & bring in letter & all meds . We'll need to address each of their multiple concerns in detail & send written report Additional Follow-up by: Marga Melnick MD,  Jul 12, 2007 6:33 PM

## 2010-04-02 NOTE — Progress Notes (Signed)
Summary: [facility] PAPERWORK TO REVIEW  Phone Note Call from Patient   Caller: Patient Summary of Call: PATIENT BROUGHT IN PAPERWORK FROM [facility] FOR DR HOPPER TO REVIEW---  WILL TAKE TO [facility] [place] Initial call taken by: Jerolyn Shin,  April 17, 2009 3:02 PM  Follow-up for Phone Call        Placed on ledge for review Follow-up by: Shonna Chock,  April 17, 2009 4:22 PM  Additional Follow-up for Phone Call Additional follow up Details #1::        I reviewed [facility] report as well as old chart. What is needed: last labs from Podiatrist & Dr Corliss Skains, new fasting lipids & TSH if not done by them. Diabetes monitor every Dec & June with A1c, urine microalbumin Lipids &  TSH (thyroid ) every December . The FAA has authorized a "rider " for your Diabetes until 2015 . This allows me to do an exam & issue a certificate every 2 years (02/09; 04/2009; 04/2011; & finally 04/2013). At that time you'll have to get a certificate through [facility] for an additional rider . He'll schedule fasting labs 02/21; he'll bring list .Add lipids (272.4) Additional Follow-up by: Marga Melnick MD,  April 17, 2009 6:34 PM

## 2010-04-02 NOTE — Progress Notes (Signed)
Summary: Lab order request  Phone Note Call from Patient Call back at Home Phone [phone]   Caller: Patient Summary of Call: Patient received letter from the [facility] requesting that he have a bloodsugar test done in December, also needs test DM supplies sent in./[facility]  February 07, 2009 4:15 PM    Spoke with patient, scheuled appointment to check labs on Tuesday @ [facility] and filled meds./[facility]  February 07, 2009 4:25 PM     New/Updated Medications: PRECISION THIN LANCETS  MISC (LANCETS) one time daily as directed PRECISION XTRA BLOOD GLUCOSE  STRP (GLUCOSE BLOOD) check one time daily as directed Prescriptions: PRECISION THIN LANCETS  MISC (LANCETS) one time daily as directed  #100 x 3   Entered by:   Shonna Chock   Authorized by:   Marga Melnick MD   Signed by:   Shonna Chock on 02/07/2009   Method used:   Faxed to ...       [facility] Environmental education officer)       [address] [address]       [place], [place]  [address]       Ph: [phone]       Fax: [phone]   RxID:   [id number] PRECISION XTRA BLOOD GLUCOSE  STRP (GLUCOSE BLOOD) check one time daily as directed  #100 x 3   Entered by:   Shonna Chock   Authorized by:   Marga Melnick MD   Signed by:   Shonna Chock on 02/07/2009   Method used:   Faxed to ...       [facility] education [facility])       [address] [address]       [place], [place]  [address]       Ph: [phone]       Fax: [phone]   RxID:   [id number]

## 2010-04-02 NOTE — Letter (Signed)
Summary: Results Follow up Letter  [facility] [place]  [address] [place], [place] [address]   Phone: [phone]  Fax: [phone]    11/01/2006 MRN: [id number]  Kyle Simon [address] [place], [place]  [address]  Dear Kyle Simon,  The following are the results of your recent test(s):  Test         Result    Pap Smear:        Normal _____  Not Normal _____ Comments: ______________________________________________________ Cholesterol: LDL(Bad cholesterol):         Your goal is less than:         HDL (Good cholesterol):       Your goal is more than: Comments:  ______________________________________________________ Mammogram:        Normal _____  Not Normal _____ Comments:  ___________________________________________________________________ Hemoccult:        Normal _____  Not normal _______ Comments:    _____________________________________________________________________ Other Tests:  Please see attached results and comments   We routinely do not discuss normal results over the telephone.  If you desire a copy of the results, or you have any questions about this information we can discuss them at your next office visit.   Sincerely,

## 2010-04-02 NOTE — Letter (Signed)
Summary: Letter from [employer] from [employer]   Imported By: Lanelle Bal 09/12/2007 12:57:00  _____________________________________________________________________  External Attachment:    Type:   Image     Comment:   External Document

## 2010-04-02 NOTE — Progress Notes (Signed)
Summary: ***REQUEST FOR LABS RESULTS***  Phone Note Call from Patient Call back at Home Phone [phone] Call back at [phone]   Caller: Patient Summary of Call: PATIENT NEEDS LAB RESULTS TODAY TO GET OUT TO THE FAA-PATIENT IS UNDER A DEADLINE. PATIENT WOULD LIKE A CALL WHEN RESULTS READY FOR PICK-UP Initial call taken by: Shonna Chock,  June 18, 2008 8:52 AM  Follow-up for Phone Call        pick data ; A1c is stable Follow-up by: Marga Melnick MD,  June 18, 2008 1:34 PM  Additional Follow-up for Phone Call Additional follow up Details #1::        LEFT MESSAGE ON MACHINE INFORMING PATIENT COPY OF REPORT READY FOR PICK-UP Additional Follow-up by: Shonna Chock,  June 18, 2008 2:35 PM

## 2010-04-02 NOTE — Miscellaneous (Signed)
Summary: Orders Update  Clinical Lists Changes  Problems: Added new problem of ADVEF, DRUG/MEDICINAL/BIOLOGICAL SUBST NOS (ICD-995.20) Added new problem of HYPERLIPIDEMIA NEC/NOS (ICD-272.4) Orders: Added new Test order of TLB-Hepatic/Liver Function Pnl (80076-HEPATIC) - Signed   Appended Document: Orders Update copy of LFTs to Dr Link Snuffer

## 2010-04-02 NOTE — Letter (Signed)
Summary: [facility]  [facility]   Imported By: Lanelle Bal 04/14/2009 13:26:50  _____________________________________________________________________  External Attachment:    Type:   Image     Comment:   External Document

## 2010-04-02 NOTE — Letter (Signed)
Summary: [facility]  [facility]   Imported By: Lanelle Bal 01/08/2009 13:56:19  _____________________________________________________________________  External Attachment:    Type:   Image     Comment:   External Document

## 2010-04-02 NOTE — Letter (Signed)
Summary: External Correspondence-[facility]  [facility]   Imported By: Vanessa Swaziland 11/16/2006 10:47:12  _____________________________________________________________________  External Attachment:    Type:   Image     Comment:   External Document

## 2010-04-02 NOTE — Letter (Signed)
Summary: Results Follow up Letter  [facility] [place]  [address] [place], [place] [address]   Phone: [phone]  Fax: [phone]    02/12/2008 MRN: [id number]  Kyle Simon [address] [place], [place]  [address]  Dear [name],  The following are the results of your recent test(s):  Test         Result    Pap Smear:        Normal _____  Not Normal _____ Comments: ______________________________________________________ Cholesterol: LDL(Bad cholesterol):         Your goal is less than:         HDL (Good cholesterol):       Your goal is more than: Comments:  ______________________________________________________ Mammogram:        Normal _____  Not Normal _____ Comments:  ___________________________________________________________________ Hemoccult:        Normal _____  Not normal _______ Comments:    _____________________________________________________________________ Other Tests: PLEASE SEE ATTACHED LABS DONE ON 02/08/2008 AND APPOINTMENT CARD TO RECHECK 07/2008    We routinely do not discuss normal results over the telephone.  If you desire a copy of the results, or you have any questions about this information we can discuss them at your next office visit.   Sincerely,

## 2010-04-02 NOTE — Letter (Signed)
Summary: Medical Certificate & Student Pilot Certificate/FAA  Medical Certificate & Student Pilot Certificate/FAA   Imported By: Lanelle Bal 04/28/2009 09:36:32  _____________________________________________________________________  External Attachment:    Type:   Image     Comment:   External Document

## 2010-04-02 NOTE — Letter (Signed)
Summary: Results Follow up Letter  [facility] [place]  [address] [place], [place] [address]   Phone: [phone]  Fax: [phone]    10/19/2006 MRN: [id number]  Kyle Simon [address] [place], [place]  [address]  Dear [name],  The following are the results of your recent test(s):  Test         Result    Pap Smear:        Normal _____  Not Normal _____ Comments: ______________________________________________________ Cholesterol: LDL(Bad cholesterol):         Your goal is less than:         HDL (Good cholesterol):       Your goal is more than: Comments:  ______________________________________________________ Mammogram:        Normal _____  Not Normal _____ Comments:  ___________________________________________________________________ Hemoccult:        Normal _____  Not normal _______ Comments:    _____________________________________________________________________ Other Tests:  Please see attached results and comments   We routinely do not discuss normal results over the telephone.  If you desire a copy of the results, or you have any questions about this information we can discuss them at your next office visit.   Sincerely,

## 2010-04-02 NOTE — Letter (Signed)
Summary: Results Follow-up Letter  [place] [facility]  [address] [place], [place] [address]   Phone: [phone]  Fax: [phone]    10/16/2007       Kyle Simon [address] [place], [place]  [address]  Dear [name],   The following are the results of your recent test(s):  Test     Result     Pap Smear    Normal_______  Not Normal_____       Comments: _________________________________________________________ Cholesterol LDL(Bad cholesterol):          Your goal is less than:         HDL (Good cholesterol):        Your goal is more than: _________________________________________________________ Other Tests:   _________________________________________________________  Please call for an appointment Or ___Please see attached lab report.______________________________________________________ _________________________________________________________ _________________________________________________________  Sincerely,  Ardyth Man [place] [facility]

## 2010-04-02 NOTE — Assessment & Plan Note (Signed)
Summary: DISCUSS LABS/[facility]   Vital Signs:  Patient profile:   77 year old male Weight:      200.4 pounds Pulse rate:   56 / minute Resp:     16 per minute BP sitting:   140 / 80  (left arm) Cuff size:   large  Vitals Entered By: Shonna Chock (November 19, 2008 9:30 AM) CC: F/U ON LABS AND DISCUSS LETTER FROM FAA Comments REVIEWED MED LIST, PATIENT AGREED DOSE AND INSTRUCTION CORRECT    CC:  F/U ON LABS AND DISCUSS LETTER FROM FAA.  History of Present Illness: FAA letter reviewed; monitor of TFTs & DM required. FBS average 104; 2 hr post meal 118-164. Weight stable essentially; no hypoglycemia (low 97). Walking 18 holes 3X/week; low carb diet. He needs med refills.  Allergies: 1)  ! * Antihistamines 2)  ! Zocor  Review of Systems General:  Denies fatigue and weight loss. Eyes:  Denies blurring, double vision, and vision loss-both eyes; last exam 10/2007 , no retinopathy. ENT:  Denies difficulty swallowing and hoarseness. CV:  Denies chest pain or discomfort, leg cramps with exertion, lightheadness, near fainting, and shortness of breath with exertion. GI:  Denies constipation and diarrhea. Derm:  Denies changes in nail beds, dryness, hair loss, and poor wound healing. Neuro:  Denies numbness and tingling; No burning in extremities. Endo:  Denies cold intolerance, excessive hunger, excessive thirst, excessive urination, and heat intolerance.  Physical Exam  General:  Appears younger than age,well-nourished,in no acute distress; alert,appropriate and cooperative throughout examination Eyes:  No lid lag Neck:  No deformities, masses, or tenderness noted. Lungs:  Normal respiratory effort, chest expands symmetrically. Lungs are clear to auscultation, no crackles or wheezes. Heart:  normal rate, regular rhythm, no gallop, no rub, no JVD, and grade 1/2 /6 systolic murmur.   Pulses:  R and L carotid,radial,dorsalis pedis and posterior tibial pulses are full and equal  bilaterally Extremities:  No clubbing, cyanosis, edema. Chronic toenail deformities  Neurologic:  alert & oriented X3 and DTRs symmetrical and normal.  No tremor   Impression & Recommendations:  Problem # 1:  HYPOTHYROIDISM (ICD-244.9) TSH therapeutic His updated medication list for this problem includes:    Synthroid 150 Mcg Tabs (Levothyroxine sodium) .Marland Kitchen... 1 by mouth qd  Problem # 2:  DIABETES MELLITUS, TYPE II (ICD-250.00) A1c 6.3%; no hypoglycemia His updated medication list for this problem includes:    Adult Aspirin Low Strength 81 Mg Tbdp (Aspirin) .Marland Kitchen... 1 by mouth qd    Metformin Hcl 500 Mg Tb24 (Metformin hcl) .Marland Kitchen... 1 by mouth with the largest meal  Complete Medication List: 1)  Synthroid 150 Mcg Tabs (Levothyroxine sodium) .Marland Kitchen.. 1 by mouth qd 2)  Flomax 0.4 Mg Cp24 (Tamsulosin hcl) .Marland Kitchen.. 1 by mouth qd 3)  Adult Aspirin Low Strength 81 Mg Tbdp (Aspirin) .Marland Kitchen.. 1 by mouth qd 4)  Crestor 5 Mg Tabs (Rosuvastatin calcium) .Marland Kitchen.. 1 by mouth qd 5)  Celebrex 200 Mg Caps (Celecoxib) .Marland Kitchen.. 1 by mouth once daily prn 6)  Metformin Hcl 500 Mg Tb24 (Metformin hcl) .Marland Kitchen.. 1 by mouth with the largest meal 7)  Precision Xtra Blood Glucose Strp (Glucose blood) .... Check one time daily as directed 8)  Centrum Silver Tabs (Multiple vitamins-minerals) .... Take 1 tablet by mouth once a day 9)  Cvs Vitamin E 200 Unit Caps (Vitamin e) .... Take 1 capsule by mouth once a day 10)  Vitamin B-12 1000 Mcg Tabs (Cyanocobalamin) .... Take 1 tablet by  mouth once a day 11)  Glucosamine Sulfate 1000 Mg Tabs (Glucosamine sulfate) .... Take 1 tablet by mouth once a day 12)  Eql Fish Oil 1000 Mg Caps (Omega-3 fatty acids) .... Take 1 tablet two times a day by mouth 13)  Potassium 90 Mg  .Marland KitchenMarland Kitchen. 1 by mouth once daily 14)  Actonel 30 Mg Tabs (Risedronate sodium) .Marland Kitchen.. 1 by mouth monthly  Patient Instructions: 1)  Check your blood sugars regularly. If your readings are usually above :130 or below 70 you should contact  our office. 2)  Please schedule a follow-up appointment in 6 months. 3)  Lipid Panel prior to visit, ICD-9:272.4 4)  TSH prior to visit, ICD-9:244.9 5)  HbgA1C prior to visit, ICD-9:250.00 6)  Urine Microalbumin prior to visit, ICD-9:250.00 7)  Hepatic Panel prior to visit, ICD-9:995.20 Prescriptions: METFORMIN HCL 500 MG  TB24 (METFORMIN HCL) 1 by mouth with the largest meal  #90 x 1   Entered and Authorized by:   Marga Melnick MD   Signed by:   Marga Melnick MD on 11/19/2008   Method used:   Print then Give to Patient   RxID:   [id number] CRESTOR 5 MG  TABS (ROSUVASTATIN CALCIUM) 1 by mouth qd  #90 x 1   Entered and Authorized by:   Marga Melnick MD   Signed by:   Marga Melnick MD on 11/19/2008   Method used:   Print then Give to Patient   RxID:   [id number] SYNTHROID 150 MCG  TABS (LEVOTHYROXINE SODIUM) 1 by mouth qd  #90 x 1   Entered and Authorized by:   Marga Melnick MD   Signed by:   Marga Melnick MD on 11/19/2008   Method used:   Print then Give to Patient   RxID:   [id number]

## 2010-04-02 NOTE — Progress Notes (Signed)
Summary: Lab Order Request (Non-[address]  Phone Note Call from Patient Call back at [facility] Phone [phone]   Caller: Patient Summary of Call: PT CALLS STATING THAT DR. Lyn Hollingshead IS REQUESTING THAT HIS POTASSIEUM BE CHECKED AT HIS LAB VISIT ON 02/02/10 SINCE IT WAS LOW LAST TIME. PLEASE ADVISE. Initial call taken by: Lavell Islam,  January 29, 2010 1:48 PM  Follow-up for Phone Call        Dr.Hopper on 12/12/09 Potassium was 46(with in range), range 35-53. Please advise if ok to recheck Follow-up by: Shonna Chock CMA,  January 29, 2010 2:14 PM  Additional Follow-up for Phone Call Additional follow up Details #1::        OK to check Additional Follow-up by: Marga Melnick MD,  January 30, 2010 1:01 PM    Additional Follow-up for Phone Call Additional follow up Details #2::    per [facility], potassium was added to lab work today Follow-up by: Jerolyn Shin,  February 02, 2010 10:49 AM

## 2010-04-02 NOTE — Assessment & Plan Note (Signed)
Summary: discuss faa forms/alr   Vital Signs:  Patient profile:   77 year old male Weight:      202.4 pounds Pulse rate:   60 / minute Resp:     17 per minute BP sitting:   140 / 80  (left arm) Cuff size:   large  Vitals Entered By: Shonna Chock (April 16, 2009 3:13 PM) CC: Discuss FAA paperwork Comments REVIEWED MED LIST, PATIENT AGREED DOSE AND INSTRUCTION CORRECT    CC:  Discuss FAA paperwork.  History of Present Illness: His A1c was 6.3% in 01/2009. FBS 80-105; 2 hr post meal < 135. No hypoglycemia. Weight up 5#. Decreased exercise ; on modified [place] low carb diet.  Allergies: 1)  ! * Antihistamines 2)  ! Zocor  Review of Systems General:  Denies fatigue. Eyes:  Denies blurring, double vision, and vision loss-both eyes; Last Ophth exam in 10/2008 ; vision 20:20 w/o glasses . No retinopathy.. CV:  Denies chest pain or discomfort, leg cramps with exertion, lightheadness, and near fainting. Derm:  Denies poor wound healing; He has been on Lamisil X 2.5 months from Dr Theodosia Paling , Podiatrist . LFTs are monitored.. Neuro:  Denies numbness and tingling; No burning infeet. L5 radiculopathy from "cyst"  as per Dr Venetia Maxon; NS planned 04/29/2009. Endo:  Denies excessive hunger, excessive thirst, and excessive urination.  Physical Exam  General:  Appears younger ,well-nourished; alert,appropriate and cooperative throughout examination Lungs:  Normal respiratory effort, chest expands symmetrically. Lungs are clear to auscultation, no crackles or wheezes. Heart:  normal rate, regular rhythm, no gallop, no rub, no JVD, and grade 1 /6 systolic murmur.   Abdomen:  Bowel sounds positive,abdomen soft and non-tender without masses, organomegaly or hernias noted. Skin:  Rubror of feet Psych:  memory intact for recent and remote, normally interactive, and good eye contact.     Impression & Recommendations:  Problem # 1:  DIABETES MELLITUS, TYPE II (ICD-250.00) Excellent control  without complication; his  single  Diabetes medication is not associated with hypoglycemic risk His updated medication list for this problem includes:    Adult Aspirin Low Strength 81 Mg Tbdp (Aspirin) .Marland Kitchen... 1 by mouth qd    Metformin Hcl 500 Mg Tb24 (Metformin hcl) .Marland Kitchen... 1 by mouth with the largest meal  Complete Medication List: 1)  Synthroid 150 Mcg Tabs (Levothyroxine sodium) .Marland Kitchen.. 1 by mouth qd 2)  Flomax 0.4 Mg Cp24 (Tamsulosin hcl) .Marland Kitchen.. 1 by mouth qd 3)  Adult Aspirin Low Strength 81 Mg Tbdp (Aspirin) .Marland Kitchen.. 1 by mouth qd 4)  Crestor 5 Mg Tabs (Rosuvastatin calcium) .Marland Kitchen.. 1 by mouth qd 5)  Celebrex 200 Mg Caps (Celecoxib) .Marland Kitchen.. 1 by mouth once daily prn 6)  Metformin Hcl 500 Mg Tb24 (Metformin hcl) .Marland Kitchen.. 1 by mouth with the largest meal 7)  Precision Thin Lancets Misc (Lancets) .... One time daily as directed 8)  Centrum Silver Tabs (Multiple vitamins-minerals) .... Take 1 tablet by mouth once a day 9)  Cvs Vitamin E 200 Unit Caps (Vitamin e) .... Take 1 capsule by mouth once a day 10)  Vitamin B-12 1000 Mcg Tabs (Cyanocobalamin) .... Take 1 tablet by mouth once a day 11)  Glucosamine Sulfate 1000 Mg Tabs (Glucosamine sulfate) .... Take 1 tablet by mouth once a day 12)  Eql Fish Oil 1000 Mg Caps (Omega-3 fatty acids) .... Take 1 tablet two times a day by mouth 13)  Potassium 90 Mg  .Marland KitchenMarland Kitchen. 1 by mouth once daily 14)  Actonel 30 Mg Tabs (Risedronate sodium) .Marland Kitchen.. 1 by mouth monthly 15)  Precision Xtra Blood Glucose Strp (Glucose blood) .... Check one time daily as directed  Patient Instructions: 1)  Check your blood sugars regularly. If your readings are usually above :150 or below 90  OR  if 2 hrs after a meal > 180  you should contact our office.Report hypoglycemia. 2)  See your eye doctor yearly to check for diabetic eye damage. 3)  Check your feet each night for sore areas, calluses or signs of infection. 4)  Please schedule a follow-up appointment in 6 months. 5)  HbgA1C prior to visit,  ICD-9:250.00 6)  Urine Microalbumin prior to visit, ICD-9:250.00 7)  Lipid Panel prior to visit, ICD-9:272.4

## 2010-04-02 NOTE — Letter (Signed)
Summary: External Correspondence/[facility]  External Correspondence/[facility]   Imported By: Freddy Jaksch 11/21/2006 11:23:45  _____________________________________________________________________  External Attachment:    Type:   Image     Comment:   External Document

## 2010-04-02 NOTE — Letter (Signed)
Summary: [facility]  [facility]   Imported By: Lanelle Bal 03/06/2010 11:00:26  _____________________________________________________________________  External Attachment:    Type:   Image     Comment:   External Document

## 2010-04-02 NOTE — Progress Notes (Signed)
Summary: refill  Phone Note Refill Request Message from:  Fax from Pharmacy on October 21, 2009 11:22 AM  Refills Requested: Medication #1:  METFORMIN HCL 500 MG  TB24 1 by mouth with the largest meal [facility] - fax [phone]  Initial call taken by: Okey Regal [place],  October 21, 2009 11:24 AM  Follow-up for Phone Call        RX was faxed to : [phone] , (not the default number for [facility]) Follow-up by: Shonna Chock CMA,  October 21, 2009 11:35 AM    Prescriptions: METFORMIN HCL 500 MG  TB24 (METFORMIN HCL) 1 by mouth with the largest meal  #90 x 1   Entered by:   Shonna Chock CMA   Authorized by:   Marga Melnick MD   Signed by:   Shonna Chock CMA on 10/21/2009   Method used:   Printed then faxed to ...       [facility] [employer])       [address] [address]       [place], [place]  [address]       Ph: [phone]       Fax: [phone]   RxID:   [id number]

## 2010-04-02 NOTE — Progress Notes (Signed)
Summary: [name] TO FAA  Phone Note Call from Patient Call back at [facility] Phone [phone]   Summary of Call: SPOKE WITH PATIENT, PATIENT RECIEVED COPY OF LABS AND  DIDNT SEE AN  INDICATION IF HE IS TO CONTINUE MED (METFORMIN) PATIENT AWARE IF NO CHANGE INDICATED HE IS TO CONTINUE MED. PATIENT SAID HE WILL NEED MAIL ORDER RX-SENT TO HIM. MAILED .Kyle Simon  February 19, 2008 9:02 AM   PATIENT THEN SAID DR.Zayda Angell NEEDS TO RESUBMIT A LETTER TO THE FAA-STATING THAT HE IS OK, USUALLY THE LETTER IS SENT ONCE YEARLY BUT BECAUSE OF THE DM, PATIENT NEEDS LETTER YEARLY. Initial call taken by: Kyle Simon,  February 19, 2008 9:03 AM  Follow-up for Phone Call        I shall send copy to [facility] of labs & last pertinent OV Follow-up by: Marga Melnick MD,  February 19, 2008 5:38 PM      Prescriptions: METFORMIN HCL 500 MG  TB24 (METFORMIN HCL) 1 by mouth with the largest meal  #90 x 1   Entered by:   Kyle Simon   Authorized by:   Marga Melnick MD   Signed by:   Kyle Simon on 02/19/2008   Method used:   Print then Give to Patient   RxID:   [id number]

## 2010-04-02 NOTE — Letter (Signed)
Summary: Results Follow up Letter  [facility] [place]  [address] [place], [place] [address]   Phone: [phone]  Fax: [phone]    04/14/2007 MRN: [id number]  Kyle Simon [address] [place], [place]  [address]  Dear Kyle Simon,  The following are the results of your recent test(s):  Test         Result    Pap Smear:        Normal _____  Not Normal _____ Comments: ______________________________________________________ Cholesterol: LDL(Bad cholesterol):         Your goal is less than:         HDL (Good cholesterol):       Your goal is more than: Comments:  ______________________________________________________ Mammogram:        Normal _____  Not Normal _____ Comments:  ___________________________________________________________________ Hemoccult:        Normal _____  Not normal _______ Comments:    _____________________________________________________________________ Other Tests:  Please see attached results and comments   We routinely do not discuss normal results over the telephone.  If you desire a copy of the results, or you have any questions about this information we can discuss them at your next office visit.   Sincerely,

## 2010-04-02 NOTE — Progress Notes (Signed)
Summary: FAA PAPERWORK AND ORIGINAL LICENSE  Phone Note Call from Patient Call back at Home Phone [phone] Call back at [facility] = [phone]   Caller: Patient Summary of Call: PATIENT BROUGHT IN PAPERWORK REGARDING INFO FOR FAA, ALSO GAVE HIS ORIGINAL LICENSE FOR FAA THAT HE SAYS DR HOPPER WILL NEED TO MAIL IN  PATIENT IS NOT SURE WHAT NEEDS TO BE DONE--IS IT UP TO DOCTOR, UP TO PATIENT OR NOTHING????  PLEASE CALL PATIENT WITH RESPONSE  WILL PUT IN PLASTIC SLEEVE AND TAKE TO HOPP'S AREA Initial call taken by: Jerolyn Shin,  August 31, 2007 5:26 PM  Follow-up for Phone Call        please copy last 2 office notes with labs  & ask him to pick up to send to [facility] via certified [employer]. Let me see him when he comes in Follow-up by: Marga Melnick MD,  September 04, 2007 5:56 PM         Appended Document: FAA PAPERWORK AND ORIGINAL LICENSE called patient left message on machine paperwork ready for pick up

## 2010-04-02 NOTE — Progress Notes (Signed)
Summary: FAA QUESTIONS RE LAB//HOP  Phone Note Call from Patient Call back at Home Phone [phone] Call back at [facility] [phone]   Caller: Patient Summary of Call: PT CALLED AND LEFT MSG ON REFILL LINE SAYS FAA HAS TO SEND RESULTS OF BLOOD TEST WANT BY END OF THE YEAR, HAD LABS DONE APRIL 2010, WILL I NEED TO DO LAB WORK AGAIN OR CAN YOU USE  THE ONE FOR APRIL, THEY ONLY WANT 2 TEST THYROID AND A1C, ALSO 3 WEEKS LEFT OF CRESTOR, METFORMIN AND THYROID MED DO I NEED ALAB WORK AGAIN FOR FAA?  Initial call taken by: Kandice Hams,  November 05, 2008 1:18 PM  Follow-up for Phone Call        WILL SEND COPY OF LABS, DR.HOPPER WHEN SHOULD PATIENT F/U ON LABS? NO INDICATION PON LAST OV OR LAST LABWORK Follow-up by: Shonna Chock,  November 05, 2008 3:03 PM  Additional Follow-up for Phone Call Additional follow up Details #1::        FAA will want labs repeated : A1c , urine microalbumin, BUN,K+,creat, TSH ( 250.00, 995.20, 244.9) Additional Follow-up by: Marga Melnick MD,  November 06, 2008 2:22 PM    Additional Follow-up for Phone Call Additional follow up Details #2::    PATIENT HAS A LAB APPT ON SEPT 10,2010 Follow-up by: Barb Merino,  November 08, 2008 8:42 AM

## 2010-04-02 NOTE — Assessment & Plan Note (Signed)
Summary: ACUTE ONLY:COLD, CONGESTION,COLORED PHELM (GREEN)//ALJ   Vital Signs:  Patient Profile:   77 Years Old Male Weight:      198.6 pounds Temp:     97.5 degrees F oral Pulse rate:   58 / minute Resp:     17 per minute BP sitting:   128 / 76  (left arm) Cuff size:   large  Pt. in pain?   no  Vitals Entered By: Shonna Chock (April 05, 2007 8:44 AM)                  Chief Complaint:  1.) COLD: CONGESTION X 1 WEEK   2.) DISCUSS METFORMIN, Cough, and Type 2 diabetes mellitus follow-up.  History of Present Illness:  Cough; Rx: TYlenol Multisymptoms, Coricidin D      This is a 77 year old man who presents with Cough.  1 tsp  2-3X/day green.  The patient reports productive cough, exertional dyspnea, and malaise, but denies non-productive cough, pleuritic chest pain, shortness of breath, wheezing, fever, and hemoptysis.  Associated symtpoms include cold/URI symptoms, sore throat, and chronic rhinitis.  The patient denies the following symptoms: nasal congestion, weight loss, acid reflux symptoms, and peripheral edema.  The cough is worse with activity.  Ineffective prior treatments have included OTC cough medication.  He is concerned about PNA; PMH PNA X 3. PMH Agent Orange  exposure 1968.  Type 2 Diabetes Mellitus Follow-Up      The patient is also here for Type 2 diabetes mellitus follow-up.  FBS < 100; pc <120. Due for cataract surgery in 6 - 8 months.  The patient denies polyuria, polydipsia, blurred vision, self managed hypoglycemia, hypoglycemia requiring help, weight loss, weight gain, and numbness of extremities.  The patient denies the following symptoms: neuropathic pain, chest pain, vomiting, orthostatic symptoms, poor wound healing, intermittent claudication, vision loss, and foot ulcer.  Since the last visit the patient reports good dietary compliance, compliance with medications, exercising regularly, and monitoring blood glucose.  The patient has been measuring capillary  blood glucose before breakfast and after dinner.  Since the last visit, the patient reports having had eye care by an ophthalmologist and no foot care.    Current Allergies: ! * ANTIHISTAMINES ! ZOCOR    Risk Factors:  Tobacco use:  quit    Year quit:  1968 Alcohol use:  yes    Type:  SOCIAL: WINE Exercise:  yes    Times per week:  5    Type:  STRETCHING, BO-FLEX    Physical Exam  General:     Well-developed,well-nourished,in no acute distress; alert,appropriate and cooperative throughout examination. Appears younger than age. Eyes:     No corneal or conjunctival inflammation noted. EOMI. Perrla. Vision grossly normal. Ears:     TMs dull Nose:     Erythema of nares Mouth:     Slightly hoarse.Tatoo post pharynx Lungs:     Normal respiratory effort, chest expands symmetrically. Lungs: minor  crackles & rales. Pulses:     R and L carotid,radial,dorsalis pedis and posterior tibial pulses are full and equal bilaterally. Extremities:     Deformities of toes; erythematous changes of feet Cervical Nodes:     No lymphadenopathy noted Axillary Nodes:     No palpable lymphadenopathy    Impression & Recommendations:  Problem # 1:  BRONCHITIS-ACUTE (ICD-466.0)  His updated medication list for this problem includes:    Amoxicillin-pot Clavulanate 875-125 Mg Tabs (Amoxicillin-pot clavulanate) .Marland Kitchen... 1 q 12  with meals   Problem # 2:  DIABETES MELLITUS, TYPE II, CONTROLLED (ICD-250.00)  His updated medication list for this problem includes:    Adult Aspirin Low Strength 81 Mg Tbdp (Aspirin) .Marland Kitchen... 1 by mouth qd    Metformin Hcl 500 Mg Tb24 (Metformin hcl) .Marland Kitchen... 1 by mouth with the largest meal   Complete Medication List: 1)  Synthroid 150 Mcg Tabs (Levothyroxine sodium) .Marland Kitchen.. 1 by mouth qd 2)  Flomax 0.4 Mg Cp24 (Tamsulosin hcl) .Marland Kitchen.. 1 by mouth qd 3)  Adult Aspirin Low Strength 81 Mg Tbdp (Aspirin) .Marland Kitchen.. 1 by mouth qd 4)  Crestor 5 Mg Tabs (Rosuvastatin calcium) .Marland Kitchen.. 1 by  mouth qd 5)  Celebrex 200 Mg Caps (Celecoxib) .Marland Kitchen.. 1 by mouth once daily prn 6)  Metformin Hcl 500 Mg Tb24 (Metformin hcl) .Marland Kitchen.. 1 by mouth with the largest meal 7)  Precision Xtra Blood Glucose Strp (Glucose blood) .... Check one time daily as directed 8)  Amoxicillin-pot Clavulanate 875-125 Mg Tabs (Amoxicillin-pot clavulanate) .Marland Kitchen.. 1 q 12 with meals   Patient Instructions: 1)  BUN,creat, K+ prior to visit, ICD-9: 995.20 2)  Hepatic Panel prior to visit, ICD-9:995.20 3)  Lipid Panel prior to visit, ICD-9:272.4 4)  TSH prior to visit, ICD-9:244.9 5)  HbgA1C prior to visit, ICD-9:250.00 6)  Urine Microalbumin prior to visit, ICD-9:250.00; @ [facility]    Prescriptions: AMOXICILLIN-POT CLAVULANATE 875-125 MG  TABS (AMOXICILLIN-POT CLAVULANATE) 1 q 12 with meals  #20 x 0   Entered and Authorized by:   Marga Melnick MD   Signed by:   Marga Melnick MD on 04/05/2007   Method used:   Print then Give to Patient   RxID:   [id number]  ]

## 2010-04-02 NOTE — Consult Note (Signed)
Summary: [facility]  [facility]   Imported By: Lanelle Bal 12/14/2007 13:03:45  _____________________________________________________________________  External Attachment:    Type:   Image     Comment:   External Document

## 2010-04-02 NOTE — Progress Notes (Signed)
Summary: FAA PAPER/RECENT LABS  Phone Note Call from Patient Call back at Home Phone [phone] Call back at [phone]   Caller: Patient Reason for Call: Lab or Test Results Summary of Call: dr. hopper patient would like the results of his last lab work  Initial call taken by: Charolette Child,  August 22, 2007 10:16 AM  Follow-up for Phone Call        SPOKE WITH PATIENT HE NEVER HEARD ABOUT LAB RESULTS DONE IN MAY, THEY LABS ARE SIGNED OFF ON, NO APPEND OR DOCUMENTATION OF SOMEONE DISCUSSING WITH PATIENT. PATIENT IS VERY CONCERNED BECAUSE LABS WERE TO BE FORWARDED TO FAA. WAS THIS DONE?? WILL FORWARD TO DR.HOPPER FOR FUTHER UNDERSTANDING OF THIS SITUATION, PLEASE ADVISE? Follow-up by: Shonna Chock,  August 22, 2007 10:50 AM  Additional Follow-up for Phone Call Additional follow up Details #1::        Left message on machine for patient to return call. Per Dr.Hopper he personally mailed all info FAA, in Re-to labs Dr.Hopper said all normal, A1c stable (recheck in 6 mo) copy of labs mailed to patient. Additional Follow-up by: Shonna Chock,  August 22, 2007 1:29 PM    Additional Follow-up for Phone Call Additional follow up Details #2::    LEFT MESSAGE ON MACHINE. Follow-up by: Shonna Chock,  August 22, 2007 4:28 PM  Additional Follow-up for Phone Call Additional follow up Details #3:: Details for Additional Follow-up Action Taken: D/W PATIENT. Additional Follow-up by: Shonna Chock,  August 22, 2007 4:47 PM

## 2010-04-02 NOTE — Letter (Signed)
Summary: [facility]  [facility]   Imported By: Lester Villalba 11/08/2008 10:32:31  _____________________________________________________________________  External Attachment:    Type:   Image     Comment:   External Document

## 2010-04-02 NOTE — Progress Notes (Signed)
Summary: Refill   Phone Note Refill Request Call back at Home Phone [phone] Call back at [phone]--CELL Message from:  Patient on March 11, 2010 10:07 AM  Refills Requested: Medication #1:  CRESTOR 5 MG  TABS 1 by mouth qd   Dosage confirmed as above?Dosage Confirmed   Supply Requested: 3 months [facility]--MAIL OR HE WILL COME AND GET IT. PT WANT TO KNOW IF HE SHOULD CONTINUE OR STOP BECAUSE ON THE LAST VISIT HE WASN'T GIVEN A SCRIPT FOR THIS.  Initial call taken by: Freddy Jaksch,  March 11, 2010 10:07 AM  Follow-up for Phone Call        Left message on voicemail informing patient that he did not get a script for this at his last visit cause he was following up on TSH and A1c check and NOT chlosterol.   Last chlosterol check was 04/22/2009, patient informed to call to schedule fasting lab appointment Lipid/Hep 272.5/995.20 for 04/2010. Follow-up by: Shonna Chock CMA,  March 11, 2010 10:22 AM    Prescriptions: CRESTOR 5 MG  TABS (ROSUVASTATIN CALCIUM) 1 by mouth qd  #90 x 0   Entered by:   Shonna Chock CMA   Authorized by:   Marga Melnick MD   Signed by:   Shonna Chock CMA on 03/11/2010   Method used:   Faxed to ...       [facility])       [address] [address]       [place], [place]  [address]       Ph: [phone]       Fax: [phone]   RxID:   [id number]

## 2010-04-02 NOTE — Letter (Signed)
Summary: External Correspondence-UROLOGY FOLLOW UP  External Correspondence-UROLOGY FOLLOW UP   Imported By: Vanessa Swaziland 12/06/2006 10:20:25  _____________________________________________________________________  External Attachment:    Type:   Image     Comment:   External Document

## 2010-04-02 NOTE — Op Note (Signed)
Summary: Laminectomy/[facility]  Laminectomy/[facility]   Imported By: Lanelle Bal 05/31/2009 10:41:40  _____________________________________________________________________  External Attachment:    Type:   Image     Comment:   External Document

## 2010-04-02 NOTE — Letter (Signed)
Summary: [facility]  [facility]   Imported By: Lanelle Bal 11/27/2009 09:56:31  _____________________________________________________________________  External Attachment:    Type:   Image     Comment:   External Document

## 2010-04-02 NOTE — Progress Notes (Signed)
Summary: refill  Phone Note Refill Request Message from:  [facility] [phone]  Refills Requested: Medication #1:  CRESTOR 5 MG  TABS 1 by mouth qd  Medication #2:  METFORMIN HCL 500 MG  TB24 1 by mouth with the largest meal levothyroxine 0.15mg   Initial call taken by: Barb Merino,  April 07, 2009 12:29 PM  Follow-up for Phone Call        Faxed to provided fax number Follow-up by: Shonna Chock,  April 08, 2009 2:12 PM    Prescriptions: METFORMIN HCL 500 MG  TB24 (METFORMIN HCL) 1 by mouth with the largest meal  #90 x 2   Entered by:   Shonna Chock   Authorized by:   Marga Melnick MD   Signed by:   Shonna Chock on 04/08/2009   Method used:   Print then Give to Patient   RxID:   [id number] CRESTOR 5 MG  TABS (ROSUVASTATIN CALCIUM) 1 by mouth qd  #90 x 0   Entered by:   Shonna Chock   Authorized by:   Marga Melnick MD   Signed by:   Shonna Chock on 04/08/2009   Method used:   Print then Give to Patient   RxID:   [id number] SYNTHROID 150 MCG  TABS (LEVOTHYROXINE SODIUM) 1 by mouth qd  #90 x 2   Entered by:   Shonna Chock   Authorized by:   Marga Melnick MD   Signed by:   Shonna Chock on 04/08/2009   Method used:   Print then Give to Patient   RxID:   [id number]

## 2010-04-02 NOTE — Letter (Signed)
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

## 2010-04-02 NOTE — Progress Notes (Signed)
Summary: need codes for 02/24/23 lab; any other orders??  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 Initial call taken by: Jerolyn Shin,  January 12, 2010 9:09 AM  Follow-up for Phone Call        244.9-TSH, A1c 250.00 Follow-up by: Shonna Chock CMA,  January 12, 2010 3:35 PM  Additional Follow-up for Phone Call Additional follow up Details #1::        added codes to lab visit Additional Follow-up by: Jerolyn Shin,  January 13, 2010 2:10 PM

## 2010-04-02 NOTE — Letter (Signed)
Summary: [facility]  [facility]   Imported By: Lanelle Bal 03/05/2009 11:45:45  _____________________________________________________________________  External Attachment:    Type:   Image     Comment:   External Document

## 2010-04-02 NOTE — Progress Notes (Signed)
Summary: hop--flight physical  Phone Note Call from Patient Call back at Home Phone [phone] Call back at [phone]   Summary of Call: pt would like to come in the first 2 wks of feb for a flight physical. but dr. hop has nothing. Initial call taken by: Freddy Jaksch,  March 23, 2007 12:56 PM  Follow-up for Phone Call        Is there an 8 oclock ? Follow-up by: Marga Melnick MD,  March 23, 2007 5:51 PM  Additional Follow-up for Phone Call Additional follow up Details #1::        fligh cpx scheduled [id number] --- patient aware this is not yearly cpx that we bill ins Additional Follow-up by: Okey Regal Spring,  March 27, 2007 1:24 PM

## 2010-04-02 NOTE — Progress Notes (Signed)
Summary: rx test strips - dr hopper  Phone Note Call from Patient Call back at Home Phone [phone]   Caller: Patient Summary of Call: patient needs test strips for precision xtra - 90 day upply for mail order ---- mail rx to patient Initial call taken by: Okey Regal Spring,  December 14, 2006 1:45 PM    New/Updated Medications: PRECISION XTRA BLOOD GLUCOSE   STRP (GLUCOSE BLOOD) check one time daily as directed   Prescriptions: PRECISION XTRA BLOOD GLUCOSE   STRP (GLUCOSE BLOOD) check one time daily as directed  #100 x 3   Entered by:   Wendall Stade   Authorized by:   Marga Melnick MD   Signed by:   Wendall Stade on 12/15/2006   Method used:   Print then Give to Patient   RxID:   [id number]  called and left message on home phone

## 2010-04-02 NOTE — Assessment & Plan Note (Signed)
Summary: discuss flight cpx results//fd   Vital Signs:  Patient profile:   77 year old male Weight:      201.4 pounds BMI:     28.19 Temp:     97.5 degrees F oral Pulse rate:   54 / minute Resp:     17 per minute BP sitting:   128 / 84  (left arm) Cuff size:   large  Vitals Entered By: Shonna Chock (June 12, 2008 11:37 AM) Comments 1.) Discuss letter from [facility]  2.) Cold: productive cough   History of Present Illness: FBS average 105-107; 2 hrs post meal < 127 on average. No hypoglycemia.  Allergies: 1)  ! * Antihistamines 2)  ! Zocor  Past History:  Past Medical History:    Hypothyroidism    Diabetes mellitus, type II    Hyperlipidemia    Snoring , PMH of ,S/P uvulectomy   Past Surgical History:    Inguinal herniorrhaphy; prostate incision for urinary urgency  by Dr Isabel Caprice 6/08    Tonsillectomy    Vasectomy    Polio as a child (age 50)     Pharyngeal surgery  for snoring (1996), Dr Wolicki(NO PMH of Sleep Apnea)    Fat nodule removed from back    Cath. - neg. (09/2001)    Right arm & wrist repair- accident    Arthroscopy (05/1998)    Colonoscopy- neg. (04/2002)    Meniscusectomy (12/2005) L knee  Review of Systems General:  Denies fatigue, weakness, and weight loss. Eyes:  Denies blurring, double vision, and vision loss-both eyes; No retinopathy 10/2007 as per Dr Lin Givens @ [facility] ENT:  Denies difficulty swallowing and hoarseness. CV:  Denies chest pain or discomfort, leg cramps with exertion, and palpitations. Resp:  Denies excessive snoring, hypersomnolence, and morning headaches. GI:  Denies constipation and diarrhea. GU:  Denies urinary frequency and urinary hesitancy; No symptoms on Flomax. MS:  Complains of joint pain; denies joint redness and joint swelling; Synvasc injections in knees from Dr Corliss Skains 09/2007, none since. On Celebrex once daily for OA. Derm:  Denies changes in color of skin, lesion(s), and rash; Chronic stable lesions w/o change in color or  size. Lesion RUE proved to be a bruise which has resolved.. Neuro:  Denies numbness and tingling; No burning in hands or feet. Endo:  Denies cold intolerance, excessive hunger, excessive thirst, excessive urination, heat intolerance, and polyuria.  Physical Exam  General:  Appears younger than age,in no acute distress; alert,appropriate and cooperative throughout examination Neck:  No deformities, masses, or tenderness noted. No nodules Lungs:  Normal respiratory effort, chest expands symmetrically. Lungs are clear to auscultation, no crackles or wheezes. Heart:  Normal rate and regular rhythm. S1 and S2 normal without gallop, murmur, click, rub .S4 with slurring Pulses:  R and L carotid,radial,dorsalis pedis and posterior tibial pulses are full and equal bilaterally Extremities:  No clubbing, cyanosis, edema, minimal OA  finger deformities noted with normal full range of motion of all joints.   Neurologic:  alert & oriented X3 and DTRs symmetrical and normal.   Skin:  Solar changes & keratoses; no atypical lesions Cervical Nodes:  No lymphadenopathy noted Axillary Nodes:  No palpable lymphadenopathy Psych:  memory intact for recent and remote, normally interactive, and good eye contact.     Impression & Recommendations:  Problem # 1:  HYPOTHYROIDISM (ICD-244.9)  His updated medication list for this problem includes:    Synthroid 150 Mcg Tabs (Levothyroxine sodium) .Marland Kitchen... 1 by  mouth qd  Orders: Venipuncture (88416) TLB-TSH (Thyroid Stimulating Hormone) (84443-TSH)  Problem # 2:  DIABETES MELLITUS, TYPE II (ICD-250.00)  His updated medication list for this problem includes:    Adult Aspirin Low Strength 81 Mg Tbdp (Aspirin) .Marland Kitchen... 1 by mouth qd    Metformin Hcl 500 Mg Tb24 (Metformin hcl) .Marland Kitchen... 1 by mouth with the largest meal  Orders: Venipuncture (60630) TLB-Creatinine, Blood (82565-CREA) TLB-Potassium (K+) (84132-K) TLB-BUN (Urea Nitrogen) (84520-BUN) TLB-A1C / Hgb A1C  (Glycohemoglobin) (83036-A1C)  Problem # 3:  SNORING, HX OF (ICD-V15.89)  No PMH of Sleep Apnea  Orders: Venipuncture (16010)  Problem # 4:  BENIGN PROSTATIC HYPERTROPHY, HX OF (ICD-V13.8) Assessment: Unchanged  PSA 1.4 by Dr Isabel Caprice  10/2007, DRE done then. He Rxed Flomax  Orders: Venipuncture (93235) TLB-Creatinine, Blood (82565-CREA) TLB-Potassium (K+) (84132-K) TLB-BUN (Urea Nitrogen) (84520-BUN)  Complete Medication List: 1)  Synthroid 150 Mcg Tabs (Levothyroxine sodium) .Marland Kitchen.. 1 by mouth qd 2)  Flomax 0.4 Mg Cp24 (Tamsulosin hcl) .Marland Kitchen.. 1 by mouth qd 3)  Adult Aspirin Low Strength 81 Mg Tbdp (Aspirin) .Marland Kitchen.. 1 by mouth qd 4)  Crestor 5 Mg Tabs (Rosuvastatin calcium) .Marland Kitchen.. 1 by mouth qd 5)  Celebrex 200 Mg Caps (Celecoxib) .Marland Kitchen.. 1 by mouth once daily prn 6)  Metformin Hcl 500 Mg Tb24 (Metformin hcl) .Marland Kitchen.. 1 by mouth with the largest meal 7)  Precision Xtra Blood Glucose Strp (Glucose blood) .... Check one time daily as directed 8)  Centrum Silver Tabs (Multiple vitamins-minerals) .... Take 1 tablet by mouth once a day 9)  Cvs Vitamin E 200 Unit Caps (Vitamin e) .... Take 1 capsule by mouth once a day 10)  Vitamin B-12 1000 Mcg Tabs (Cyanocobalamin) .... Take 1 tablet by mouth once a day 11)  Calcium-magnesium-zinc 1000-400-15 Mg Tabs (Calcium-magnesium-zinc) .... Take 1 tablet by mouth once a day 12)  Glucosamine Sulfate 1000 Mg Tabs (Glucosamine sulfate) .... Take 1 tablet by mouth once a day 13)  Eql Fish Oil 1000 Mg Caps (Omega-3 fatty acids) .... Take 1 tablet two times a day by mouth 14)  Potassium 90 Mg  .Marland KitchenMarland Kitchen. 1 by mouth once daily 15)  Actonel 30 Mg Tabs (Risedronate sodium) .Marland Kitchen.. 1 by mouth monthly  Patient Instructions: 1)  Obtain requested data from treating physicians.  Appended Document: discuss flight cpx results//fd  Laboratory Results   Urine Tests   Date/Time Reported: June 12, 2008 1:12 PM   Routine Urinalysis   Color: yellow Appearance: Clear Glucose:  negative   (Normal Range: Negative) Bilirubin: negative   (Normal Range: Negative) Ketone: negative   (Normal Range: Negative) Spec. Gravity: <1.005   (Normal Range: 1.003-1.035) Blood: negative   (Normal Range: Negative) pH: 7.5   (Normal Range: 5.0-8.0) Protein: negative   (Normal Range: Negative) Urobilinogen: negative   (Normal Range: 0-1) Nitrite: negative   (Normal Range: Negative) Leukocyte Esterace: negative   (Normal Range: Negative)    Comments: Floydene Flock CMA  June 12, 2008 1:12 PM

## 2010-04-02 NOTE — Letter (Signed)
Summary: Results Follow up Letter  [facility] [place]  [address] [place], [place] [address]   Phone: [phone]  Fax: [phone]    11/11/2008 MRN: [id number]  Kyle Simon [address] [place], [place]  [address]  Dear [name],  The following are the results of your recent test(s):  Test         Result    Pap Smear:        Normal _____  Not Normal _____ Comments: ______________________________________________________ Cholesterol: LDL(Bad cholesterol):         Your goal is less than:         HDL (Good cholesterol):       Your goal is more than: Comments:  ______________________________________________________ Mammogram:        Normal _____  Not Normal _____ Comments:  ___________________________________________________________________ Hemoccult:        Normal _____  Not normal _______ Comments:    _____________________________________________________________________ Other Tests: Please see attached labs done on 11/08/2008 . Please call to schedule appointment to discuss if not already scheduled    We routinely do not discuss normal results over the telephone.  If you desire a copy of the results, or you have any questions about this information we can discuss them at your next office visit.   Sincerely,

## 2010-04-02 NOTE — Progress Notes (Signed)
Summary: refill  Phone Note Refill Request Message from:  Fax from Pharmacy on May 29, 2009 12:26 PM  Refills Requested: Medication #1:  CRESTOR 5 MG  TABS 1 by mouth qd  Medication #2:  METFORMIN HCL 500 MG  TB24 1 by mouth with the largest meal [facility] [phone]   Method Requested: Fax to Local Pharmacy Next Appointment Scheduled: no appt Initial call taken by: Barb Merino,  May 29, 2009 12:27 PM  Follow-up for Phone Call        Faxed to [phone]   Follow-up by: Shonna Chock,  May 29, 2009 1:04 PM    Prescriptions: METFORMIN HCL 500 MG  TB24 (METFORMIN HCL) 1 by mouth with the largest meal  #90 x 3   Entered by:   Shonna Chock   Authorized by:   Marga Melnick MD   Signed by:   Shonna Chock on 05/29/2009   Method used:   Print then Give to Patient   RxID:   [id number] CRESTOR 5 MG  TABS (ROSUVASTATIN CALCIUM) 1 by mouth qd  #90 x 3   Entered by:   Shonna Chock   Authorized by:   Marga Melnick MD   Signed by:   Shonna Chock on 05/29/2009   Method used:   Print then Give to Patient   RxID:   [id number]

## 2010-04-02 NOTE — Assessment & Plan Note (Signed)
Summary: review lab/cbs   Vital Signs:  Patient profile:   77 year old male Weight:      200 pounds BMI:     28.80 Pulse rate:   54 / minute Resp:     15 per minute BP sitting:   114 / 68  (left arm) Cuff size:   large  Vitals Entered By: Shonna Chock CMA (February 13, 2010 10:35 AM) CC: 1.) Follow-up visit: discuss labs ( patient with mailed copy)  2.)Discuss letter to [facility] discussing TSH/A1c results ONLY, Type 2 diabetes mellitus follow-up   CC:  1.) Follow-up visit: discuss labs ( patient with mailed copy)  2.)Discuss letter to [facility] discussing TSH/A1c results ONLY and Type 2 diabetes mellitus follow-up.  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  The patient denies polyuria, polydipsia, blurred vision, self managed hypoglycemia, weight loss, weight gain, and numbness of extremities.  The patient denies the following symptoms: neuropathic pain, chest pain, vomiting, orthostatic symptoms, poor wound healing, intermittent claudication, vision loss, and foot ulcer.  Since the last visit the patient reports good dietary compliance and exercising regularly.  The patient has been measuring capillary blood glucose before breakfast ( 97-115, average 107)  and  2 hrs after   meals (< 130).  Since the last visit, the patient reports having had foot care by a podiatrist , every 6 months.  A1c 6.6% ( average sugar 143, risk 32%). He took Prednisone for 10 days in Nov from Dentist  for post op inflammation.                                                                                                       TSH is therapeutic; see ROS for  Endocrine assessment. K+ 5.5 on  OTC supplement once daily ; Na+ 136.  Current Medications (verified): 1)  Synthroid 150 Mcg  Tabs (Levothyroxine Sodium) .Marland Kitchen.. 1 By Mouth Qd 2)  Flomax 0.4 Mg  Cp24 (Tamsulosin Hcl) .Marland Kitchen.. 1 By Mouth Qd 3)  Adult Aspirin Low Strength  81 Mg  Tbdp (Aspirin) .Marland Kitchen.. 1 By Mouth Qd 4)  Crestor 5 Mg  Tabs (Rosuvastatin Calcium) .Marland Kitchen.. 1 By Mouth Qd 5)  Celebrex 200 Mg  Caps (Celecoxib) .Marland Kitchen.. 1 By Mouth Once Daily Prn 6)  Metformin Hcl 500 Mg  Tb24 (Metformin Hcl) .Marland Kitchen.. 1 By Mouth With The Largest Meal 7)  Precision Thin Lancets  Misc (Lancets) .... One Time Daily As Directed 8)  Centrum Silver   Tabs (Multiple Vitamins-Minerals) .... Take 1 Tablet By Mouth Once A Day 9)  Cvs Vitamin E 200 Unit  Caps (Vitamin E) .... Take 1 Capsule By Mouth Once A Day 10)  B-Complex High Potency  Cr-Tabs (B Complex Vitamins) .Marland Kitchen.. 1 By Mouth Once Daily 11)  Glucosamine Sulfate 1000 Mg  Tabs (Glucosamine Sulfate) .... Take 1 Tablet By Mouth Once A Day 12)  Eql Fish Oil 1000 Mg  Caps (Omega-3 Fatty  Acids) .... Take 1 Tablet Two Times A Day By Mouth 13)  Actonel 30 Mg Tabs (Risedronate Sodium) .Marland Kitchen.. 1 By Mouth Monthly 14)  Precision Xtra Blood Glucose  Strp (Glucose Blood) .... Check One Time Daily As Directed 15)  Zinc 60mg  .... 1 By Mouth Once Daily 16)  Vitamin D3 5000 Unit Caps (Cholecalciferol) .Marland Kitchen.. 1 By Mouth Once Daily  Allergies: 1)  ! * Antihistamines 2)  ! Zocor  Review of Systems General:  Denies fatigue. Eyes:  Denies double vision and vision loss-both eyes. CV:  Denies palpitations. Derm:  Denies changes in nail beds, dryness, and hair loss. Neuro:  Denies tingling. Endo:  Denies cold intolerance and heat intolerance.  Physical Exam  General:  well-nourished;alert,appropriate and cooperative throughout examination Eyes:  No corneal or conjunctival inflammation noted.No lid lag Neck:  No deformities, masses, or tenderness noted. Lungs:  Normal respiratory effort, chest expands symmetrically. Lungs are clear to auscultation, no crackles or wheezes. Heart:  regular rhythm, no murmur, no gallop, no rub, no JVD, and bradycardia.   Pulses:  R and L carotid,radial,dorsalis pedis and posterior tibial pulses are full and equal  bilaterally Extremities:  No clubbing, cyanosis, edema.Podiatrist seen every 6 months. Neurologic:  alert & oriented X3 and DTRs symmetrical and normal.   Skin:  Intact without suspicious lesions or rashes Psych:  memory intact for recent and remote, normally interactive, and good eye contact.     Impression & Recommendations:  Problem # 1:  DIABETES MELLITUS, TYPE II (ICD-250.00) A1c higher, probably  due to recent steroids from Dentist .Medications will not be changed, but  A1c will be rechecked in 4 months to rule out progression of Diabetes His updated medication list for this problem includes:    Adult Aspirin Low Strength 81 Mg Tbdp (Aspirin) .Marland Kitchen... 1 by mouth qd    Metformin Hcl 500 Mg Tb24 (Metformin hcl) .Marland Kitchen... 1 by mouth with the largest meal  Problem # 2:  HYPOTHYROIDISM (ICD-244.9) TSH therapeutic His updated medication list for this problem includes:    Synthroid 150 Mcg Tabs (Levothyroxine sodium) .Marland Kitchen... 1 by mouth qd  Problem # 3:  HYPOKALEMIA, MILD (ICD-276.8) resolved; Na+ 136  Complete Medication List: 1)  Synthroid 150 Mcg Tabs (Levothyroxine sodium) .Marland Kitchen.. 1 by mouth qd 2)  Flomax 0.4 Mg Cp24 (Tamsulosin hcl) .Marland Kitchen.. 1 by mouth qd 3)  Adult Aspirin Low Strength 81 Mg Tbdp (Aspirin) .Marland Kitchen.. 1 by mouth qd 4)  Crestor 5 Mg Tabs (Rosuvastatin calcium) .Marland Kitchen.. 1 by mouth qd 5)  Celebrex 200 Mg Caps (Celecoxib) .Marland Kitchen.. 1 by mouth once daily prn 6)  Metformin Hcl 500 Mg Tb24 (Metformin hcl) .Marland Kitchen.. 1 by mouth with the largest meal 7)  Precision Thin Lancets Misc (Lancets) .... One time daily as directed 8)  Centrum Silver Tabs (Multiple vitamins-minerals) .... Take 1 tablet by mouth once a day 9)  Cvs Vitamin E 200 Unit Caps (Vitamin e) .... Take 1 capsule by mouth once a day 10)  B-complex High Potency Cr-tabs (B complex vitamins) .Marland Kitchen.. 1 by mouth once daily 11)  Glucosamine Sulfate 1000 Mg Tabs (Glucosamine sulfate) .... Take 1 tablet by mouth once a day 12)  Eql Fish Oil 1000 Mg Caps  (Omega-3 fatty acids) .... Take 1 tablet two times a day by mouth 13)  Actonel 30 Mg Tabs (Risedronate sodium) .Marland Kitchen.. 1 by mouth monthly 14)  Precision Xtra Blood Glucose Strp (Glucose blood) .... Check one time daily as directed 15)  Zinc 60mg   .Marland KitchenMarland KitchenMarland Kitchen  1 by mouth once daily 16)  Vitamin D3 5000 Unit Caps (Cholecalciferol) .Marland Kitchen.. 1 by mouth once daily  Other Orders: Pneumococcal Vaccine ([address]) Admin 1st Vaccine (60454)  Patient Instructions: 1)  Stop OTC potassium ( K+ ) supplement. 2)  Please schedule a follow-up appointment in 4months. 3)  BUN,creat, K+ ; 4)  HbgA1C . Prescriptions: METFORMIN HCL 500 MG  TB24 (METFORMIN HCL) 1 by mouth with the largest meal  #90 x 1   Entered by:   Shonna Chock CMA   Authorized by:   Marga Melnick MD   Signed by:   Shonna Chock CMA on 02/13/2010   Method used:   Print then Give to Patient   RxID:   [id number] SYNTHROID 150 MCG  TABS (LEVOTHYROXINE SODIUM) 1 by mouth qd  #90 x 3   Entered and Authorized by:   Marga Melnick MD   Signed by:   Marga Melnick MD on 02/13/2010   Method used:   Print then Give to Patient   RxID:   [id number]    Orders Added: 1)  Pneumococcal Vaccine [90732] 2)  Admin 1st Vaccine [90471] 3)  Est. Patient Level III [41324]   Immunizations Administered:  Pneumonia Vaccine:    Vaccine Type: Pneumovax (Medicare)    Site: left deltoid    Mfr: Merck    Dose: 0.5 ml    Route: IM    Given by: Shonna Chock CMA    Exp. Date: 07/01/2011    Lot #: [id number]   Immunizations Administered:  Pneumonia Vaccine:    Vaccine Type: Pneumovax (Medicare)    Site: left deltoid    Mfr: Merck    Dose: 0.5 ml    Route: IM    Given by: Shonna Chock CMA    Exp. Date: 07/01/2011    Lot #: [id number]

## 2010-04-02 NOTE — Assessment & Plan Note (Signed)
Summary: discuss flight physical/cbs   Vital Signs:  Patient Profile:   77 Years Old Male Height:     71 inches Weight:      197 pounds Temp:     98.1 degrees F oral Pulse rate:   54 / minute Resp:     16 per minute BP sitting:   120 / 80  (left arm)  Pt. in pain?   no  Vitals Entered By: Ardyth Man (Jul 18, 2007 3:32 PM)                  Chief Complaint:  DISCUSS FLIGHT PHYSICAL and Type 2 diabetes mellitus follow-up.  History of Present Illness: On Metformin since 7/08.  Type 2 Diabetes Mellitus Follow-Up      This is a 77 year old man who presents for Type 2 diabetes mellitus follow-up.  FBS 95-100; pc  glucoses 100-125.  The patient denies polyuria, polydipsia, blurred vision, self managed hypoglycemia, hypoglycemia requiring help, weight loss, weight gain, and numbness of extremities.  The patient denies the following symptoms: neuropathic pain, chest pain, vomiting, orthostatic symptoms, poor wound healing, intermittent claudication, vision loss, and foot ulcer.  Since the last visit the patient reports good dietary compliance, compliance with medications, exercising regularly, and monitoring blood glucose.  The patient has been measuring capillary blood glucose before breakfast and after dinner.  Since the last visit, the patient reports having had eye care by an ophthalmologist and no foot care.      Current Allergies: ! * ANTIHISTAMINES ! ZOCOR     Review of Systems  General      Denies fatigue and weight loss.  Eyes      Denies blurring, double vision, and vision loss-both eyes.      To see Dr Emmit Pomfret for cataract  CV      Denies chest pain or discomfort, difficulty breathing at night, difficulty breathing while lying down, leg cramps with exertion, palpitations, shortness of breath with exertion, swelling of feet, and swelling of hands.  Resp      Denies cough, shortness of breath, and sputum productive.  GI      Denies constipation and  diarrhea.  GU      Denies dysuria, hematuria, incontinence, and urinary frequency.      Nocturia X 1  Derm      Complains of changes in nail beds.      Denies dryness and hair loss.  Neuro      Denies brief paralysis, difficulty with concentration, disturbances in coordination, falling down, memory loss, poor balance, sensation of room spinning, and weakness.  Endo      Denies cold intolerance, excessive hunger, excessive thirst, excessive urination, heat intolerance, polyuria, and weight change.   Physical Exam  General:     Well-developed,well-nourished,in no acute distress; alert,appropriate and cooperative throughout examination; appears younger Neck:     No deformities, masses, or tenderness noted. Lungs:     Normal respiratory effort, chest expands symmetrically. Lungs are clear to auscultation, no crackles or wheezes. Heart:     normal rate, regular rhythm, no gallop, no rub, no JVD, no HJR, and grade 1 /6 systolic murmur.   Abdomen:     Bowel sounds positive,abdomen soft and non-tender without masses, organomegaly or hernias noted.No AAA or bruits Pulses:     R and L carotid,radial,dorsalis pedis and posterior tibial pulses are full and equal bilaterally Extremities:     No clubbing, cyanosis, edema. Deformed nails Neurologic:  alert & oriented X3 and DTRs symmetrical and normal.   Skin:     Intact without suspicious lesions or rashes Psych:     memory intact for recent and remote, normally interactive, good eye contact, and not anxious appearing.      Impression & Recommendations:  Problem # 1:  DIABETES MELLITUS, TYPE II (ICD-250.00)  His updated medication list for this problem includes:    Adult Aspirin Low Strength 81 Mg Tbdp (Aspirin) .Marland Kitchen... 1 by mouth qd    Metformin Hcl 500 Mg Tb24 (Metformin hcl) .Marland Kitchen... 1 by mouth with the largest meal   Problem # 2:  HYPERLIPIDEMIA (ICD-272.4)  His updated medication list for this problem includes:    Crestor 5  Mg Tabs (Rosuvastatin calcium) .Marland Kitchen... 1 by mouth qd   Problem # 3:  HYPOTHYROIDISM (ICD-244.9)  His updated medication list for this problem includes:    Synthroid 150 Mcg Tabs (Levothyroxine sodium) .Marland Kitchen... 1 by mouth qd   Complete Medication List: 1)  Synthroid 150 Mcg Tabs (Levothyroxine sodium) .Marland Kitchen.. 1 by mouth qd 2)  Flomax 0.4 Mg Cp24 (Tamsulosin hcl) .Marland Kitchen.. 1 by mouth qd 3)  Adult Aspirin Low Strength 81 Mg Tbdp (Aspirin) .Marland Kitchen.. 1 by mouth qd 4)  Crestor 5 Mg Tabs (Rosuvastatin calcium) .Marland Kitchen.. 1 by mouth qd 5)  Celebrex 200 Mg Caps (Celecoxib) .Marland Kitchen.. 1 by mouth once daily prn 6)  Metformin Hcl 500 Mg Tb24 (Metformin hcl) .Marland Kitchen.. 1 by mouth with the largest meal 7)  Precision Xtra Blood Glucose Strp (Glucose blood) .... Check one time daily as directed 8)  Amoxicillin-pot Clavulanate 875-125 Mg Tabs (Amoxicillin-pot clavulanate) .Marland Kitchen.. 1 q 12 with meals 9)  Centrum Silver Tabs (Multiple vitamins-minerals) .... Take 1 tablet by mouth once a day 10)  Cvs Vitamin E 200 Unit Caps (Vitamin e) .... Take 1 capsule by mouth once a day 11)  Vitamin B-12 1000 Mcg Tabs (Cyanocobalamin) .... Take 1 tablet by mouth once a day 12)  Calcium-magnesium-zinc 1000-400-15 Mg Tabs (Calcium-magnesium-zinc) .... Take 1 tablet by mouth once a day 13)  Glucosamine Sulfate 1000 Mg Tabs (Glucosamine sulfate) .... Take 1 tablet by mouth once a day 14)  Eql Fish Oil 1000 Mg Caps (Omega-3 fatty acids) .... Take 1 tablet two times a day by mouth 15)  Potassium 90 Mg  .... Take 1 tablet by mouth two times a day   Patient Instructions: 1)  BUN,creat,K+ prior to visit, ICD-9: 250.00 2)  HbgA1C prior to visit, ICD-9:250.00 3)  Hepatic Panel prior to visit, ICD-9:995.20 . Labs @ [email or web address]   ]

## 2010-04-02 NOTE — Letter (Signed)
Summary: Results Follow up Letter  [facility] [place]  [address] [place], [place] [address]   Phone: [phone]  Fax: [phone]    02/17/2009 MRN: [id number]  Kyle Simon [address] [place], [place]  [address]  Dear [name],  The following are the results of your recent test(s):  Test         Result    Pap Smear:        Normal _____  Not Normal _____ Comments: ______________________________________________________ Cholesterol: LDL(Bad cholesterol):         Your goal is less than:         HDL (Good cholesterol):       Your goal is more than: Comments:  ______________________________________________________ Mammogram:        Normal _____  Not Normal _____ Comments:  ___________________________________________________________________ Hemoccult:        Normal _____  Not normal _______ Comments:    _____________________________________________________________________ Other Tests: Please see attached labs done on 02/11/2009, call to schedule recheck in 6 months. Happy Holidays./Chrae Malloy  February 17, 2009 8:17 AM    We routinely do not discuss normal results over the telephone.  If you desire a copy of the results, or you have any questions about this information we can discuss them at your next office visit.   Sincerely,

## 2010-04-02 NOTE — Letter (Signed)
Summary: [facility]  [facility]   Imported By: Lanelle Bal 05/28/2008 13:13:04  _____________________________________________________________________  External Attachment:    Type:   Image     Comment:   External Document

## 2010-04-02 NOTE — Letter (Signed)
Summary: Letter Concerning Special Issuance of Third Class [employer]man Certifi  Letter Concerning Special Issuance of Third Class [employer]man Certificate/FAA   Imported By: Lanelle Bal 07/26/2008 13:40:31  _____________________________________________________________________  External Attachment:    Type:   Image     Comment:   External Document

## 2010-04-02 NOTE — Progress Notes (Signed)
Summary: FAA Concerns  Phone Note Call from Patient Call back at Home Phone [phone] Call back at [phone]   Caller: Patient Summary of Call: Message left on VM: Patient contacted the FAA and was told Dr.Hopper should be the one to issue New Medical Certificate.   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

## 2010-04-02 NOTE — Letter (Signed)
Summary: [facility]  [facility]   Imported By: Lanelle Bal 07/30/2009 08:29:31  _____________________________________________________________________  External Attachment:    Type:   Image     Comment:   External Document

## 2010-04-02 NOTE — Letter (Signed)
Summary: [facility]  [facility]   Imported By: Freddy Jaksch 07/28/2007 10:47:22  _____________________________________________________________________  External Attachment:    Type:   Image     Comment:   External Document  Appended Document: [facility] Dr Corliss Skains : Kyle Simon injections for DJD of knees

## 2010-04-16 NOTE — Letter (Signed)
Summary: [facility]  [facility]   Imported By: Maryln Gottron 04/07/2010 11:14:15  _____________________________________________________________________  External Attachment:    Type:   Image     Comment:   External Document

## 2010-04-30 ENCOUNTER — Encounter: Payer: Self-pay | Admitting: Internal Medicine

## 2010-05-21 LAB — BASIC METABOLIC PANEL
BUN: 14 mg/dL (ref 6–23)
CO2: 26 mEq/L (ref 19–32)
Calcium: 8.4 mg/dL (ref 8.4–10.5)
Chloride: 100 mEq/L (ref 96–112)
Creatinine, Ser: 0.8 mg/dL (ref 0.4–1.5)
GFR calc Af Amer: >60 mL/min (ref >60)
GFR calc non Af Amer: >60 mL/min (ref >60)
Glucose, Bld: 162 mg/dL — ABNORMAL HIGH (ref 70–99)
Potassium: 4.2 mEq/L (ref 3.5–5.1)
Sodium: 132 mEq/L — ABNORMAL LOW (ref 135–145)

## 2010-05-21 LAB — SURGICAL PCR SCREEN
MRSA, PCR: NEGATIVE
Staphylococcus aureus: POSITIVE — AB

## 2010-05-25 LAB — GLUCOSE, CAPILLARY
Glucose-Capillary: 129 mg/dL — ABNORMAL HIGH (ref 70–99)
Glucose-Capillary: 146 mg/dL — ABNORMAL HIGH (ref 70–99)
Glucose-Capillary: 90 mg/dL (ref 70–99)
Glucose-Capillary: 98 mg/dL (ref 70–99)

## 2010-06-04 ENCOUNTER — Other Ambulatory Visit (INDEPENDENT_AMBULATORY_CARE_PROVIDER_SITE_OTHER): Payer: Medicare Other

## 2010-06-04 DIAGNOSIS — E876 Hypokalemia: Secondary | ICD-10-CM

## 2010-06-04 DIAGNOSIS — E039 Hypothyroidism, unspecified: Secondary | ICD-10-CM

## 2010-06-04 DIAGNOSIS — E119 Type 2 diabetes mellitus without complications: Secondary | ICD-10-CM

## 2010-06-04 LAB — POTASSIUM: Potassium: 5.1 mEq/L (ref 3.5–5.1)

## 2010-06-04 LAB — HEMOGLOBIN A1C: Hgb A1c MFr Bld: 6.8 % — ABNORMAL HIGH (ref 4.6–6.5)

## 2010-06-04 LAB — BUN: BUN: 17 mg/dL (ref 6–23)

## 2010-06-04 LAB — CREATININE, SERUM: Creatinine, Ser: 0.9 mg/dL (ref 0.4–1.5)

## 2010-06-16 ENCOUNTER — Encounter: Payer: Self-pay | Admitting: Internal Medicine

## 2010-06-16 ENCOUNTER — Ambulatory Visit (INDEPENDENT_AMBULATORY_CARE_PROVIDER_SITE_OTHER): Payer: Medicare Other | Admitting: Internal Medicine

## 2010-06-16 DIAGNOSIS — E119 Type 2 diabetes mellitus without complications: Secondary | ICD-10-CM

## 2010-06-16 NOTE — Progress Notes (Signed)
Subjective:    Patient ID: Kyle Simon, male    DOB: 1932-09-07, 77 y.o.   MRN: [id number]  HPI  Diabetes status assessment :Fasting or morning glucose range:  97-118 or average :  112    ;   Highest glucose 2 hours after any meal:   < 140 ;  Hypoglycemia :  no  .                                                           Excess thirst :no;  Excess hunger: no ;  Excess urination:  no .                                            Lightheadedness with standing:  no ; Chest pain:  no ; Palpitations :no ;  Pain in  calves with walking:  no .                                                                                                                                                              Non healing skin  ulcers or sores,especially over the feet:  no ; Numbness or tingling or burning in feet : no.                                                                                                                                                 Significant change in  Weight :  stable  ;Vision changes : no  Exercise : until L knee strained 04/08 ;  [facility]ut[facility]diet:  [facility]   Medication compliance : yes ; Medication adverse  Effects:  no  ;  Eye exam : current ; Foot care : yes ;  A1c/ urine microalbumin monitor:   6.8%( average sugar 13244 % increased risk)      Review of Systems     Objective:   Physical Exam on exam he is in no acute distress; he appears younger than  stated age  Chest is clear to auscultation without rales, rhonchi, or wheezing.  He has a slow S4 without significant murmurs or gallops.  All pulses are intact; no bruits are present.  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 He has chronic nail changes        Assessment & Plan:  #1 diabetes mellitus; control is good. There's been slight deterioration in his A1c were related to dietary  changes.  Most importantly there are  no complications of hypoglycemic or cardiovascular nature.   plan: #1 no change we made in medications. The A1c will be repeated in 4 months along with a urine microalbumin. Compliance with low carb diet would be recommended. This will be especially important if he is unable to exercise because of the acute  knee issue.

## 2010-06-16 NOTE — Patient Instructions (Signed)
Recheck A1c with urine microalbumin in 4 months.( 250.00)

## 2010-07-17 NOTE — Op Note (Signed)
NAMETAARIQ, LEITZ                ACCOUNT NO.:  [id number]   MEDICAL RECORD NO.:  [id number]          PATIENT TYPE:  AMB   LOCATION:  [facility]                         FACILITY:  [facility]   PHYSICIAN:  Valetta Fuller, M.D.  DATE OF BIRTH:  26-Jun-1932   DATE OF PROCEDURE:  07/28/2006  DATE OF DISCHARGE:                               OPERATIVE REPORT   PREOPERATIVE DIAGNOSIS:  Bladder neck obstruction/benign prostatic  hypertrophy.   POSTOPERATIVE DIAGNOSIS:  Bladder neck obstruction/benign prostatic  hypertrophy.   PROCEDURE PERFORMED:  Transurethral incision of the prostate.   SURGEON:  Valetta Fuller, M.D.   ANESTHESIA:  General.   INDICATIONS:  This is a 77 year old male who has had longstanding  progressive bladder neck obstruction and BPH symptoms.  The patient has  been on alpha blocker therapy which initially helped quite a bit.  He  subsequently had significant decline in his voiding symptoms.  His AUA  symptom score was 22 with a high bothersome index.  Further evaluation  included cystoscopy which revealed a very prominent high-riding median  bar.  The patient had mild trabecular change.  Maximum flow was 7 mL/sec  with a mean flow of only 5 mL/second.  The patient underwent extensive  counseling with regard to treatment options.  We felt he would be an  excellent candidate for a transurethral incision of the prostate.  He  accepted that recommendation.  He presents now for the procedure.   TECHNIQUE AND FINDINGS:  The patient was brought to the operating room  where he had successful induction of general anesthesia.  He was placed  in lithotomy position and prepped and draped in the usual manner.  A 28-  French continuous flow resectoscope sheath was then placed without  difficulty.  A Collins knife was utilized.  Again the prostatic urethra  was about 3.5 cm with moderate lateral lobe hyperplasia but a  significant and high-riding prominent median bar.  Utilizing the  [facility] we made a single incision from between the ureteral orifices  completely through the bladder neck down to capsular fibers and through  the posterior aspect of the prostate to the verumontanum.  This resulted  in marked improvement of visual obstruction.  Hemostasis was  accomplished utilizing the cautery and the [employer].  At the  completion of the procedure it was very easy to place a 22-French Foley  catheter.  Urine was a light pink color.  The patient appeared to  tolerate the procedure well.  There were no obvious complications.  He  was brought to recovery room in stable condition.           ______________________________  Valetta Fuller, M.D.  Electronically Signed     DSG/MEDQ  D:  07/28/2006  T:  07/28/2006  Job:  [id number]

## 2010-07-17 NOTE — Cardiovascular Report (Signed)
   NAMEBRAYDON, Kyle Simon                          ACCOUNT NO.:  [id number]   MEDICAL RECORD NO.:  [id number]                   PATIENT TYPE:  OIB   LOCATION:  2861                                 FACILITY:  [facility]   PHYSICIAN:  Noralyn Pick. Eden Emms, M.D. [facility]           DATE OF BIRTH:  Dec 17, 1932   DATE OF PROCEDURE:  DATE OF DISCHARGE:                              CARDIAC CATHETERIZATION   PROCEDURE:  Heart catheterization.   INDICATION:  Abnormal Cardiolite study with chest pain.   DESCRIPTION OF PROCEDURE:  Standard catheterization was done from the right  femoral artery.   RESULTS:  Left main coronary artery was normal.   Left anterior descending artery had 20% multiple discrete lesions in the mid  and distal vessel.   The first and second diagonal branch is normal.   The left circumflex coronary artery was codominant and large. There were 20-  30% multiple discrete lesions in the mid and distal portion.  The left-sided  PDA had 20% multiple discrete lesions.   The right coronary artery was a medium sized vessel. However, it also  supplied the PDA. There was 20-30% multiple discrete lesions in the distal  PDA and right coronary artery.   RIGHT ANTERIOR OBLIQUE VENTRICULOGRAPHY:  RAO ventriculography was reveals  normal ejection fraction of 60%.  There is no gradient across the aortic  valve and no MR.  LV pressure is in the 130/12 range and aortic pressure is  in the 130/70 range.   The patient would appear to have a false-positive Cardiolite study.  He does  not have critical coronary disease. At the end of the case, the right  coronary artery was closed using the Perclose device with reasonable  hemostasis. The patient was given a gram of Ancef and will be followed up in  the office in a week to check his wound.                                                        Noralyn Pick. Eden Emms, M.D. [facility]    PCN/MEDQ  D:  11/10/2001  T:  11/12/2001  Job:  [id number]

## 2010-07-17 NOTE — Op Note (Signed)
   NAME:  Kyle Simon, Kyle Simon                          ACCOUNT NO.:  [id number]   MEDICAL RECORD NO.:  [id number]                   PATIENT TYPE:  AMB   LOCATION:  DAY                                  [facility]ACILITY[facility]   PHYSICIAN:  Vikki Ports, M.D.         DATE OF BIRTH:  22-Sep-1932   DATE OF PROCEDURE:  05/28/2002  DATE OF DISCHARGE:                                 OPERATIVE REPORT   PREOPERATIVE DIAGNOSIS:  Right back mass.   POSTOPERATIVE DIAGNOSIS:  Right back mass, lipoma.   PROCEDURE:  Excision of right back mass, 3 cm.   ANESTHESIA:  Local MAC.   SURGEON:  Vikki Ports, M.D.   DESCRIPTION OF PROCEDURE:  The patient was taken to the operating room and  placed in the supine position.  After adequate anesthesia was induced using  MAC technique, the patient was placed in the left lateral decubitus  position.  Right back was prepped and draped in the normal sterile fashion,  and an area previously marked where the palpable mass was anesthetized.  The  skin and subcutaneous tissue was anesthetized with 1% lidocaine.  An  incision was made over the mass and dissected down through subcutaneous  tissue onto a well-encapsulated fatty, lobulated mass.  This was excised in  its entirety.  Adequate hemostasis of the cavity was ensured and the skin  was closed with subcuticular 4-0 Monocryl.  Steri-Strips and sterile dressings were applied.  The patient tolerated the  procedure well and went to PACU in good condition.                                               Vikki Ports, M.D.    KRH/MEDQ  D:  05/28/2002  T:  05/28/2002  Job:  [id number]

## 2010-07-17 NOTE — Op Note (Signed)
NAME:  Kyle Simon, Kyle Simon                          ACCOUNT NO.:  [id number]   MEDICAL RECORD NO.:  [id number]                   PATIENT TYPE:  AMB   LOCATION:  [facility]                                 FACILITY:  [facility]   PHYSICIAN:  Vikki Ports, M.D.         DATE OF BIRTH:  03/26/1932   DATE OF PROCEDURE:  11/11/2003  DATE OF DISCHARGE:                                 OPERATIVE REPORT   PREOPERATIVE DIAGNOSIS:  Right inguinal hernia.   POSTOPERATIVE DIAGNOSIS:  Right inguinal hernia.   PROCEDURE:  Laparoscopic right inguinal hernia repair with mesh.   ANESTHESIA:  General.   SURGEON:  Vikki Ports, M.D.   DESCRIPTION OF PROCEDURE:  The patient was taken to the operating room and  placed in a supine position.  After adequate general anesthesia was induced  using endotracheal tube, a Foley catheter was placed.  The abdomen was  prepped and draped in the normal sterile fashion.  Using a transverse  infraumbilical incision, I dissected down onto the right rectus muscle  fascia.  The anterior fascia was opened and rectus muscles were retracted  laterally.  A dissecting balloon was placed in the preperitoneal space and  under direct visualization, was insufflated.  The operating balloon was then  placed and pneumopreperitoneum was obtained.  Coopers ligament was cleaned  off as was the pubic tubercle.  The spermatic cord was identified and  inspected.  There was no evidence of indirect sac.  The direct sac had  previously been reduced from the hernia defect.  After adequate blunt  dissection, a piece of 4 by 6 Pyratec mesh was placed into the preperitoneal  space and tacked to the anterior abdominal wall both medial and lateral to  the epigastric vessels and to Coopers ligament.  It laid out laterally  towards the anterior superior iliac spine covering the hernia defect  completely.  The pneumopreperitoneum was released, the 5 mm trocars were  removed, the operating trocar  was removed.  The fascia was closed with an 0  Vicryl figure-of-eight suture, the skin was closed with staples.  The  patient tolerated the procedure well and went to the PACU in good condition.                                               Vikki Ports, M.D.    KRH/MEDQ  D:  11/11/2003  T:  11/11/2003  Job:  [id number]

## 2010-07-17 NOTE — Op Note (Signed)
Kyle Simon, Kyle Simon                ACCOUNT NO.:  [id number]   MEDICAL RECORD NO.:  [id number]          PATIENT TYPE:  AMB   LOCATION:  [facility]                         FACILITY:  [facility]   PHYSICIAN:  Vikki Ports, MDDATE OF BIRTH:  25-Oct-1932   DATE OF PROCEDURE:  03/27/2004  DATE OF DISCHARGE:                                 OPERATIVE REPORT   PREOPERATIVE DIAGNOSIS:  Recurrent right inguinal hernia.   POSTOPERATIVE DIAGNOSIS:  Recurrent right inguinal hernia.   PROCEDURE:  Recurrent right inguinal hernia with mesh.   SURGEON:  Danna Hefty, M.D.   ANESTHESIA:  General.   DESCRIPTION OF PROCEDURE:  The patient was taken to the operating room and  placed in a supine position.  After adequate general anesthesia was induced  using endotracheal tube, the right groin and perineum were prepped and  draped in the normal sterile fashion.  Using an oblique incision over the  inguinal canal, I dissected down to the external oblique fascia.  This was  opened along its fibers down to the external ring.  The spermatic cord was  identified and Penrose drain was passed around it.  Indirect hernia sac  which was quite small was identified, peeled off the spermatic cord, and  ligated at the internal ring.  A separate direct defect quite lateral to its  normal position was identified, and this sac was also dissected off the  lateral portion of the cord.  On opening the sac, there was obvious colon  contents within the sac.  Great care was taken not to injure this.  The sac  was then ligated with a running pursestring 0 Surgilon suture.  I then  approximated the shelving edge of the inguinal ligament to the transversalis  fascia using interrupted #1 Surgilons.  I did do a fascial split on the  transversalis fascia.  This closed the entire floor of Hasselbalch's  triangle as well as lateral to the internal ring nicely.  Parietex mesh was  then placed over the repair and tacked the pubic  tubercle to transversalis  fascia into the inguinal ligament with a running #1 Prolene suture.  It was  split and brought out lateral to the cord.  I was satisfied with the repair.  The external oblique fascia was closed with a running 3-0 Vicryl.  Skin was  closed with staples, and tissues were injected with Marcaine 0.5%.  The  patient tolerated the procedure well and went to PACU in good condition.      KRH/MEDQ  D:  03/27/2004  T:  03/27/2004  Job:  [id number]

## 2010-07-17 NOTE — H&P (Signed)
Kyle Simon, Kyle Simon                          ACCOUNT NO.:  [id number]   MEDICAL RECORD NO.:  [id number]                   PATIENT TYPE:  OIB   LOCATION:  2861                                 FACILITY:  [facility]   PHYSICIAN:  Noralyn Pick. Eden Emms, M.D. [facility]           DATE OF BIRTH:  1933/02/02   DATE OF ADMISSION:  11/10/2001  DATE OF DISCHARGE:  11/10/2001                                HISTORY & PHYSICAL   CHIEF COMPLAINT:  Abnormal Cardiolite.   HISTORY OF PRESENT ILLNESS:  The patient is a 77 year old male patient of  Dr. Alwyn Ren who was evaluated by Dr. Eden Emms for an abnormal Cardiolite.  He  was evaluated by Dr. Alwyn Ren in regards to increasing his insurance and found  to have bradycardia.  Because of this and some cardiac risk factors, he had  a stress Cardiolite.  The Cardiolite showed some suggestion of inferior wall  ischemia, and he is here today for cardiac catheterization.  The patient  never gets chest pain and has no significant dyspnea on exertion or  orthopnea or PND.  He is very active and eats a healthy diet.  He has been  taking an aspirin a day.   PAST MEDICAL HISTORY:  1. Hyperlipidemia.  2. Remote history of tobacco use.  3. Hypothyroidism.  There is no history of diabetes, hypertension, or family history of  premature coronary artery disease.   PAST SURGICAL HISTORY:  1. Hernia repair.  2. Knee surgery.  3. Nasal surgery.   SOCIAL HISTORY:  He is married and lives in [place].  He has a remote  history of tobacco use, does not abuse alcohol, and eats a healthy diet.  He  gets regular exercise.   FAMILY HISTORY:  His mother died in her 28's, did not have heart disease,  his father died in his 87's in some sort of accident.  He has no siblings  with coronary artery disease.   ALLERGIES:  No known drug allergies.   MEDICATIONS:  1. Simvastatin 40 mg q.d.  2. Levothyroxine 0.15 mg q.d.  3. Doxazosin 2 mg q.d.  4. Bextra 10 mg q.d.  5. Aspirin 81 mg  q.d.  6. Vitamin E 400 IU q.d.  7. Vitamin B 1000 mg q.d.  8. Calcium 1000 mg q.d.  9. Multivitamins q.d.  10.      Glucosamine chondroitin 750/600 mg q.d.  11.      Zinc 60 mg q.d.  12.      Potassium 90 mg b.i.d.   REVIEW OF SYMPTOMS:  Significant for remote history of benign prostatic  hypertrophy symptoms which resolved once he was started on Ditropan.  He  denies any GI symptoms.  He has no recent febrile illness.  He denies any  history of chills.  He has no recent history of shortness of breath or  dyspnea on exertion.  He denies edema or palpitations or presyncope.  He has  occasional arthralgias, but these are well controlled by the Bextra.  Review  of systems is otherwise negative.   PHYSICAL EXAMINATION:  VITAL SIGNS:  Temperature 97.5, blood pressure  133/78, heart rate 48 and regular, respiratory rate are 16 and not labored.  GENERAL:  A well-developed, well-nourished middle-aged male in no acute  distress.  HEENT:  Pupils equal, round, reactive to light and accommodation.  Extraocular movements were intact.  Head is normocephalic, atraumatic.  NECK:  Without JVD, lymphadenopathy, or thyromegaly.  No bruits are  appreciated.  CHEST:  Clear to auscultation bilaterally.  CARDIOVASCULAR:  Heart is regular rate and rhythm with no significant  murmurs, rubs, or gallops noted.  ABDOMEN:  Soft and nontender with active bowel sounds.  EXTREMITIES:  Without edema with distal pulses intact in all four  extremities.  NEUROLOGIC:  He is alert and oriented with no focal deficits noted.   LABORATORY DATA:  EKG showed sinus bradycardia rate of 52 beats per minute,  no acute changes.  Chest x-ray showed no active disease.  Laboratory values:  Hemoglobin 14.4, hematocrit 45, platelets 216, white blood cell count 4.8.  Sodium is 139, potassium 4.9, chloride 107, CO2 29, BUN 23, creatinine 1.2,  glucose 120.  INR 1.0.  PTT 25.5.  Total cholesterol 147, triglycerides 134,  HDL 41.1,  LDL 79.  TSH 1.9.   ASSESSMENT AND PLAN:  Abnormal Cardiolite.  The patient is here for cardiac  catheterization.  Further evaluation and medications changes will depend on  those results.  The patient is otherwise stable, and will follow up with Dr.  Debby Bud as indicated.      Lavella Hammock, P.A. [facility]                  Peter C. Eden Emms, M.D. [facility]    RG/MEDQ  D:  11/10/2001  T:  11/12/2001  Job:  [id number]

## 2010-08-03 ENCOUNTER — Ambulatory Visit (INDEPENDENT_AMBULATORY_CARE_PROVIDER_SITE_OTHER): Payer: Medicare Other | Admitting: Internal Medicine

## 2010-08-03 ENCOUNTER — Encounter: Payer: Self-pay | Admitting: Internal Medicine

## 2010-08-03 DIAGNOSIS — E785 Hyperlipidemia, unspecified: Secondary | ICD-10-CM

## 2010-08-03 DIAGNOSIS — R079 Chest pain, unspecified: Secondary | ICD-10-CM

## 2010-08-03 DIAGNOSIS — E119 Type 2 diabetes mellitus without complications: Secondary | ICD-10-CM

## 2010-08-03 LAB — D-DIMER, QUANTITATIVE (NOT AT ARMC): D-Dimer, Quant: 0.42 ug/mL-FEU (ref 0.00–0.48)

## 2010-08-03 LAB — TROPONIN I: Troponin I: <0.01 ng/mL (ref <0.06)

## 2010-08-03 LAB — CK TOTAL AND CKMB (NOT AT ARMC)
CK, MB: 5.5 ng/mL — ABNORMAL HIGH (ref 0.3–4.0)
Relative Index: 1.9 (ref 0.0–2.5)
Total CK: 293 U/L — ABNORMAL HIGH (ref 7–232)

## 2010-08-03 MED ORDER — RANITIDINE HCL 150 MG PO TABS: 150.0000 mg | ORAL_TABLET | Freq: Two times a day (BID) | ORAL | Status: DC

## 2010-08-03 NOTE — Patient Instructions (Addendum)
The triggers for dyspepsia or "heart burn"  include stress; the "aspirin family" ; alcohol; peppermint; and caffeine (coffee, tea, cola, and chocolate). The aspirin family would include aspirin and the nonsteroidal agents such as ibuprofen &  Naproxen. Tylenol would not cause reflux. If having dyspepsia ; food & dink should be avoided for @ least 2 hors before going to bed.  Note: Cardiology referral in place; discussed by phone with him

## 2010-08-03 NOTE — Progress Notes (Signed)
  Subjective:    Patient ID: Kyle Simon, male    DOB: 01/14/1933, 77 y.o.   MRN: [id number]  HPI CHEST PAIN; last episode for few seconds this am Location:LSB   Quality:dull, aching Duration: seconds Onset (rest, exertion): w/o trigger, not worse with exertion Radiation: no Better with: burping & Pepcid AC help Worse with: no definite factor Symptoms History of Trauma/lifting: no  Nausea/vomiting: no  Diaphoresis: no  Shortness of breath: no  Pleuritic: no  Cough: no  Edema: no  Orthopnea: no  PND: no  Dizziness: no  Palpitations: no  Syncope: no  Indigestion: yes [place] Worse with exertion: no            Recent Immobility: no  Cancer history: no  Tearing/radiation to back: no  Fh : mother ? CAD     Review of Systems FBS 104-118. No BRRB or melena. No dysphagia.     Objective:   Physical Exam General appearance is of good health and nourishment; no acute distress or increased work of breathing is present.  No  lymphadenopathy about the head, neck, or axilla noted.   Eyes: No conjunctival inflammation or lid edema is present. There is no scleral icterus.  Oral exam: Dental hygiene is good; lips and gums are healthy appearing.There is no oropharyngeal erythema or exudate noted. Radiation tatoo of uvula  Neck:  No deformities, thyromegaly, masses, or tenderness noted.   Supple with full range of motion without pain.   Heart:  Normal rate and regular rhythm. S1 and S2 normal without gallop, click, rub or other extra sounds.  Grade 1/6 murmur  Lungs:Chest clear to auscultation; no wheezes, rhonchi,rales ,or rubs present.No increased work of breathing.    Extremities:  No cyanosis, edema, or clubbing  noted . Homan's negative   Skin: Warm & dry w/o jaundice or tenting.          Assessment & Plan:  #1 chest pain , atypical Plan: ranitidine 150 mg

## 2010-08-04 ENCOUNTER — Telehealth: Payer: Self-pay

## 2010-08-04 DIAGNOSIS — R748 Abnormal levels of other serum enzymes: Secondary | ICD-10-CM

## 2010-08-04 DIAGNOSIS — R1013 Epigastric pain: Secondary | ICD-10-CM

## 2010-08-04 LAB — CBC WITH DIFFERENTIAL/PLATELET
Basophils Absolute: 0 10*3/uL (ref 0.0–0.1)
Basophils Relative: 0.7 % (ref 0.0–3.0)
Eosinophils Absolute: 0.4 10*3/uL (ref 0.0–0.7)
Eosinophils Relative: 5.5 % — ABNORMAL HIGH (ref 0.0–5.0)
HCT: 36.3 % — ABNORMAL LOW (ref 39.0–52.0)
Hemoglobin: 12.7 g/dL — ABNORMAL LOW (ref 13.0–17.0)
Lymphocytes Relative: 45.9 % (ref 12.0–46.0)
Lymphs Abs: 3.1 10*3/uL (ref 0.7–4.0)
MCHC: 34.9 g/dL (ref 30.0–36.0)
MCV: 88.6 fl (ref 78.0–100.0)
Monocytes Absolute: 1.3 10*3/uL — ABNORMAL HIGH (ref 0.1–1.0)
Monocytes Relative: 19.2 % — ABNORMAL HIGH (ref 3.0–12.0)
Neutro Abs: 2 10*3/uL (ref 1.4–7.7)
Neutrophils Relative %: 28.7 % — ABNORMAL LOW (ref 43.0–77.0)
Platelets: 201 10*3/uL (ref 150.0–400.0)
RBC: 4.1 Mil/uL — ABNORMAL LOW (ref 4.22–5.81)
RDW: 14.8 % — ABNORMAL HIGH (ref 11.5–14.6)
WBC: 6.8 10*3/uL (ref 4.5–10.5)

## 2010-08-04 NOTE — Telephone Encounter (Signed)
Message copied by Edgardo Roys on Tue Aug 04, 2010  5:41 PM ------      Message from: Pecola Lawless      Created: Tue Aug 04, 2010  2:29 PM       CK is an enzyme released with any  muscle injury or overuse; CK-MB is the  Specific component from the heart muscle. Troponin 1 is a specific marker for cardiac injury also. CK elevation alone, without rise in MB fraction or Troponin 1 suggests no cardiac process is present.      D-dimer is a screening test for blood clots in the lung (& also in the leg). These are NOT diagnostic , but  worrisome . I recommend Cardilogy evaluation ASAP.

## 2010-08-04 NOTE — Telephone Encounter (Signed)
I left details given on lab results and informed patient to still call back to further discuss with Dr.Hopper

## 2010-08-05 NOTE — Telephone Encounter (Signed)
He will try to give [place] a FAX for labs;he is to avoid strenuous activities until return

## 2010-08-05 NOTE — Telephone Encounter (Signed)
Patient called earlier today and spoke with Dr.Hopper

## 2010-08-06 ENCOUNTER — Encounter: Payer: Self-pay | Admitting: Internal Medicine

## 2010-08-06 NOTE — Telephone Encounter (Signed)
Addended by: Edgardo Roys on: 08/06/2010 09:42 AM   Modules accepted: Orders

## 2010-08-07 ENCOUNTER — Encounter: Payer: Self-pay | Admitting: Internal Medicine

## 2010-08-10 ENCOUNTER — Ambulatory Visit (INDEPENDENT_AMBULATORY_CARE_PROVIDER_SITE_OTHER): Payer: Medicare Other | Admitting: Internal Medicine

## 2010-08-10 ENCOUNTER — Encounter: Payer: Self-pay | Admitting: Internal Medicine

## 2010-08-10 DIAGNOSIS — R079 Chest pain, unspecified: Secondary | ICD-10-CM | POA: Insufficient documentation

## 2010-08-10 DIAGNOSIS — R9430 Abnormal result of cardiovascular function study, unspecified: Secondary | ICD-10-CM

## 2010-08-10 NOTE — Patient Instructions (Addendum)
Your physician has requested that you have a stress echocardiogram. For further information please visit [email or web address]. Please follow instruction sheet as given.   

## 2010-08-10 NOTE — Assessment & Plan Note (Signed)
As above.

## 2010-08-10 NOTE — Assessment & Plan Note (Addendum)
Patient has an abnormal CK-MB in the setting of a normal troponin and atypical chest pain.  With his prior history I suspect that this is noncardiac; however, I think it is incumbent especially given his desire to fly to exclude coronary disease had progressed since his catheterization in 2003. We will plan to undertake a stress echo as it previously false positive Myoview precludes nuclear scanning. Given his age the specificity of CTA is also diminished. In the event that a stress echo is abnormal catheterization  will be indicated.

## 2010-08-10 NOTE — Progress Notes (Signed)
Addended by: Dennis Bast F on: 08/10/2010 05:08 PM   Modules accepted: Orders

## 2010-08-10 NOTE — Progress Notes (Signed)
HPI: Kyle Simon is a 77 y.o. male See at the request of Dr. Alwyn Ren because of atypical chest pain and abnormal CK-MB in the setting of a normal troponin.  About 3 weeks ago 2 events occurred. The first is that his Flomax was increased. 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 Around that same time he began to develop a left parasternal chest discomfort that was rather fleeting. It would last seconds. It would recur repeatedly though over hours. It was unassociated with shortness of breath nausea or diaphoresis. He noted no change in his exercise tolerance.  He went to go see Dr. Alwyn Ren; elecrocardiogram was done and was normal. I reviewed this also. His troponin was negative his CK-MB was abnormal at 5.5/293   Cardiac risk factors are notable for age dyslipidemia and diabetes. He does not have hypertension.  He underwent catheterization 2003 for abnormal Myoview. The catheterization demonstrated 20-30% lesions but no significant obstructive disease it was interpreted that the Myoview is falsely positive. Current Outpatient Prescriptions  Medication Sig Dispense Refill  . aspirin 81 MG tablet Take 81 mg by mouth daily.        Marland Kitchen b complex vitamins tablet Take 1 tablet by mouth daily.        . Cholecalciferol (VITAMIN D3) 5000 UNITS CAPS Take by mouth daily.        . fish oil-omega-3 fatty acids 1000 MG capsule Take 2 g by mouth 2 (two) times daily.        . Glucosamine HCl 1000 MG TABS Take by mouth daily.        Marland Kitchen levothyroxine (SYNTHROID, LEVOTHROID) 150 MCG tablet Take 150 mcg by mouth daily.        . metFORMIN (GLUMETZA) 500 MG (MOD) 24 hr tablet Take 500 mg by mouth as directed. 1 po once daily with largest meal       . Multiple Vitamin (MULTIVITAMIN) tablet Take 1 tablet by mouth daily.        . risedronate (ACTONEL) 30 MG tablet Take 30 mg by mouth every 30 (thirty) days. with water on empty stomach, nothing by mouth or lie down for next 30  minutes.       . rosuvastatin (CRESTOR) 5 MG tablet Take 5 mg by mouth daily.        . Tamsulosin HCl (FLOMAX) 0.4 MG CAPS Take by mouth 2 (two) times daily.       . Zinc Gluconate 10 MG TABS Take 10 mg by mouth daily.        . celecoxib (CELEBREX) 200 MG capsule Take 200 mg by mouth daily as needed.        . ranitidine (ZANTAC) 150 MG tablet Take 1 tablet (150 mg total) by mouth 2 (two) times daily.  60 tablet  2  . vitamin E 200 UNIT capsule Take 200 Units by mouth daily.          Allergies  Allergen Reactions  . Simvastatin     REACTION: increased CK    Past Medical History  Diagnosis Date  . Hypothyroidism   . Diabetes mellitus     adult onset  . Hyperlipidemia   . Snoring     no sleep apnea  . Polio     as a child w/o complications    Past Surgical History  Procedure Date  . Uvulectomy 1996    Dr. Lazarus Salines  . Inguinal hernia repair 2002  right, Dr. Luan Pulling  . Tonsillectomy   . Prostate surgery 07/2006    for urinary urgency, Dr. Isabel Caprice  . Vasectomy   . Lipoma excision     back  . Cardiac catheterization 09/2001  . Wrist surgery 1959    fracture  . Meniscectomy 2004    Dr. Thurston Hole    Family History  Problem Relation Age of Onset  . Sudden death Father     MVA  . Coronary artery disease Mother   . Emphysema Mother   . Diverticulosis Mother   . Asthma Brother   . Bone cancer Brother   . Diabetes Paternal Grandmother   . Sarcoidosis Son     History   Social History  . Marital Status: Married    Spouse Name: N/A    Number of Children: N/A  . Years of Education: N/A   Occupational History  . Not on file.   Social History Main Topics  . Smoking status: Former Smoker    Quit date: 03/01/1966  . Smokeless tobacco: Not on file  . Alcohol Use: Yes     socially  . Drug Use: No  . Sexually Active: Not on file   Other Topics Concern  . Not on file   Social History Narrative  . No narrative on file    Fourteen point review of systems was  negative except as noted in HPI and PMH   PHYSICAL EXAMINATION  Blood pressure 130/70, pulse 60, height 5\' 10"  (1.778 m), weight 200 lb (90.719 kg).   Well developed and nourished older Caucasian male appearing his stated age in no acute distress HENT normal Neck supple with JVP-flat Carotids brisk and full without bruits Back without scoliosis or kyphosis Clear with increased AP diameter his chest Regular rate and rhythm, no murmurs or gallops Abd-soft with active BS without hepatomegaly or midline pulsation Femoral pulses 2+ distal pulses intact No Clubbing cyanosis edema; Skin-warm and dry; venous insufficiency changes notable on his lower extremity LN-neg submandibular and supraclavicular A & Oriented CN 3-12 normal  Grossly normal sensory and motor function Affect engaging . ECG was reviewed and was normal

## 2010-08-18 ENCOUNTER — Other Ambulatory Visit (HOSPITAL_COMMUNITY): Payer: Medicare Other | Admitting: Radiology

## 2010-08-19 ENCOUNTER — Other Ambulatory Visit: Payer: Self-pay | Admitting: Internal Medicine

## 2010-08-19 NOTE — Telephone Encounter (Signed)
LIPID/HEP 272.4/995.20  

## 2010-08-19 NOTE — Telephone Encounter (Signed)
Recheck A1c with urine microalbumin in 4 months.( 250.00) 

## 2010-08-20 ENCOUNTER — Ambulatory Visit (HOSPITAL_BASED_OUTPATIENT_CLINIC_OR_DEPARTMENT_OTHER): Payer: Medicare Other

## 2010-08-20 ENCOUNTER — Ambulatory Visit (HOSPITAL_COMMUNITY): Payer: Medicare Other | Attending: Internal Medicine | Admitting: Radiology

## 2010-08-20 DIAGNOSIS — R072 Precordial pain: Secondary | ICD-10-CM

## 2010-08-20 DIAGNOSIS — R079 Chest pain, unspecified: Secondary | ICD-10-CM | POA: Insufficient documentation

## 2010-08-20 DIAGNOSIS — R0989 Other specified symptoms and signs involving the circulatory and respiratory systems: Secondary | ICD-10-CM

## 2010-08-21 ENCOUNTER — Telehealth: Payer: Self-pay | Admitting: Internal Medicine

## 2010-08-21 NOTE — Telephone Encounter (Signed)
I left a message I will call with results once Dr. Graciela Husbands reviews them.

## 2010-08-21 NOTE — Telephone Encounter (Signed)
Pt calling for test results.

## 2010-08-25 NOTE — Telephone Encounter (Signed)
The patient is aware of his results. 

## 2010-08-26 ENCOUNTER — Ambulatory Visit (INDEPENDENT_AMBULATORY_CARE_PROVIDER_SITE_OTHER): Payer: Medicare Other | Admitting: Internal Medicine

## 2010-08-26 ENCOUNTER — Encounter: Payer: Self-pay | Admitting: Internal Medicine

## 2010-08-26 VITALS — BP 130/80 | HR 80 | Wt 199.0 lb

## 2010-08-26 DIAGNOSIS — K219 Gastro-esophageal reflux disease without esophagitis: Secondary | ICD-10-CM

## 2010-08-26 MED ORDER — OMEPRAZOLE 20 MG PO CPDR: 20.0000 mg | DELAYED_RELEASE_CAPSULE | Freq: Two times a day (BID) | ORAL | Status: DC

## 2010-08-26 NOTE — Patient Instructions (Signed)
The triggers for dyspepsia or "heart burn"  include stress; the "aspirin family" ; alcohol; peppermint; and caffeine (coffee, tea, cola, and chocolate). The aspirin family would include aspirin and the nonsteroidal agents such as ibuprofen &  Naproxen. Tylenol would not cause reflux. If having dyspepsia ; food & dink should be avoided for @ least 2 hors before going to bed. The Actonel most likely induced reflux.

## 2010-08-26 NOTE — Progress Notes (Signed)
  Subjective:    Patient ID: Kyle Simon, male    DOB: 09-Sep-1932, 77 y.o.   MRN: [id number]  HPI Atypical chest discomfort has improved 75% with Ranitidine 150 mg bid. Dr Odessa Fleming Cardiology evaluation & Stress ECHO reviewed.  Residual symptoms include epigastric pressure & burping intermittently. No dysphagia, melena or BRRB.     Review of Systems Actonel was stopped 30 days ago after 3 years     Objective:   Physical Exam General appearance is one of good health and nourishment w/o distress.  Eyes: No conjunctival inflammation or scleral icterus is present.  Oral exam: Dental hygiene is good; lips and gums are healthy appearing.There is no oropharyngeal erythema or exudate noted.Punctate palatine  tatoo   Heart:  Normal rate and regular rhythm. S1 and S2 normal without gallop,  click, rub or other extra sounds  . Grade 1/6 systolic murmur   Lungs:Chest clear to auscultation; no wheezes, rhonchi,rales ,or rubs present.No increased work of breathing.   Abdomen: bowel sounds normal, soft and non-tender without masses, organomegaly or hernias noted.  No guarding or rebound   Skin:Warm & dry.  Intact without suspicious lesions or rashes ; no jaundice or tenting  Lymphatic: No lymphadenopathy is noted about the head, neck, axilla.               Assessment & Plan:  #1 GERD, probable exacerbation by Bisphosphonate therapy Plan: Omeprazole X 8 weeks; stop H2 Blocker

## 2010-10-16 ENCOUNTER — Other Ambulatory Visit: Payer: Self-pay | Admitting: Internal Medicine

## 2010-10-23 ENCOUNTER — Other Ambulatory Visit: Payer: Self-pay | Admitting: Internal Medicine

## 2010-10-28 ENCOUNTER — Other Ambulatory Visit: Payer: Self-pay | Admitting: Internal Medicine

## 2010-10-28 DIAGNOSIS — E119 Type 2 diabetes mellitus without complications: Secondary | ICD-10-CM

## 2010-10-29 ENCOUNTER — Other Ambulatory Visit (INDEPENDENT_AMBULATORY_CARE_PROVIDER_SITE_OTHER): Payer: Medicare Other

## 2010-10-29 DIAGNOSIS — E119 Type 2 diabetes mellitus without complications: Secondary | ICD-10-CM

## 2010-10-29 LAB — MICROALBUMIN / CREATININE URINE RATIO
Creatinine,U: 79.2 mg/dL
Microalb Creat Ratio: 0.4 mg/g (ref 0.0–30.0)
Microalb, Ur: 0.3 mg/dL (ref 0.0–1.9)

## 2010-10-29 LAB — HEMOGLOBIN A1C: Hgb A1c MFr Bld: 6.7 % — ABNORMAL HIGH (ref 4.6–6.5)

## 2010-10-29 NOTE — Progress Notes (Signed)
Labs only

## 2010-11-05 ENCOUNTER — Ambulatory Visit: Payer: Medicare Other | Admitting: Internal Medicine

## 2010-11-12 ENCOUNTER — Encounter: Payer: Self-pay | Admitting: Internal Medicine

## 2010-11-12 ENCOUNTER — Ambulatory Visit (INDEPENDENT_AMBULATORY_CARE_PROVIDER_SITE_OTHER): Payer: Medicare Other | Admitting: Internal Medicine

## 2010-11-12 DIAGNOSIS — IMO0002 Reserved for concepts with insufficient information to code with codable children: Secondary | ICD-10-CM

## 2010-11-12 DIAGNOSIS — K219 Gastro-esophageal reflux disease without esophagitis: Secondary | ICD-10-CM

## 2010-11-12 DIAGNOSIS — E119 Type 2 diabetes mellitus without complications: Secondary | ICD-10-CM

## 2010-11-12 DIAGNOSIS — E785 Hyperlipidemia, unspecified: Secondary | ICD-10-CM

## 2010-11-12 DIAGNOSIS — M1712 Unilateral primary osteoarthritis, left knee: Secondary | ICD-10-CM

## 2010-11-12 DIAGNOSIS — R030 Elevated blood-pressure reading, without diagnosis of hypertension: Secondary | ICD-10-CM

## 2010-11-12 DIAGNOSIS — M1710 Unilateral primary osteoarthritis, unspecified knee: Secondary | ICD-10-CM

## 2010-11-12 MED ORDER — RANITIDINE HCL 150 MG PO CAPS: 150.0000 mg | ORAL_CAPSULE | Freq: Two times a day (BID) | ORAL | Status: DC

## 2010-11-12 MED ORDER — LOSARTAN POTASSIUM 50 MG PO TABS: 50.0000 mg | ORAL_TABLET | Freq: Every day | ORAL | Status: DC

## 2010-11-12 NOTE — Patient Instructions (Addendum)
Your BP goal = AVERAGE < 135/85. Avoid ingestion of  excess salt/sodium.Cook with pepper & other spices . Use the salt substitute "No Salt"(unless your potassium has been elevated) OR the Mrs Sharilyn Sites products to season food @ the table. Avoid foods which taste salty or "vinegary" as their sodium contentet will be high.   You are medically cleared for the surgery.

## 2010-11-12 NOTE — Progress Notes (Signed)
  Subjective:    Patient ID: Kyle Simon, male    DOB: [birth year], 77 y.o.   MRN: [id number]  HPI HYPERTENSION: no PMH of but see VS Disease Monitoring: Blood pressure range-not monitored  Chest pain, palpitations- no        Dyspnea- no Medications: Compliance- no meds  Lightheadedness,Syncope- no    Edema- no  DIABETES: A1c 6.7%  On 10/29/2010 Disease Monitoring: Blood Sugar ranges-FBS  110-145 ; 2 hrs post meal < 160 Polyuria/phagia/dipsia- no       Visual problems- no Medications: Compliance- yes  Hypoglycemic symptoms- no  HYPERLIPIDEMIA:no recent lipids Disease Monitoring: See symptoms for Hypertension Medications: Compliance- yes  Abd pain, bowel changes- no   Muscle aches- not other than back  Pre op evaluation: Surgical diagnosis:DJD L knee Tentative surgical date/Surgeon:Dr Lucey, TBA Severity:up to 5 Pain: aching Activity of daily living limitation/impairment of function:worse walking or playing golf; difficulty arising from chair Treatment to date, efficacy:ice , Celebrex, Tylenol with some  benefit  Significant past medical history: no PMH  Hypertension (but see BP); coronary disease; cancer    Smoking Status :quit 1968      Review of Systems   His GI symptoms are resolved with ranitidine. He denies severe dyspepsia, dysphagia, abdominal pain, rectal bleeding or melena.     Objective:   Physical Exam Gen.: Healthy  & well-nourished, appropriate and alert, weight Eyes: No lid/conjunctival changes, extraocular motion intact Neck: Normal range of motion, thyroid normal Respiratory: No increased work of breathing or abnormal breath sounds Cardiac : regular rhythm, no extra heart sounds, gallop. Grade 1-1.5 systolic murmur Abdomen: No organomegaly ,masses, bruits or aortic enlargement Lymph: No lymphadenopathy of the neck or axilla Skin: No rashes, lesions, ulcers or ischemic changes Muscle skeletal: R great toe nail fungal  changes; marked crepitus  left knee with decreased range of motion Vasc:All pulses intact, no bruits present Neuro: Normal deep tendon reflexes, alert & oriented, sensation over feet normal Psych: judgment and insight, mood and affect normal         Assessment & Plan:  #1 diabetes, good control. Most important goal is to avoid hypoglycemia  #2 degenerative joint disease left knee, end stage. This is symptomatic and impacting quality of life and activities of daily living as noted  #3 elevated blood pressure without diagnosis of hypertension. This may be related to pain from the knee. Prophylactically he would be a candidate for an ACE inhibitor or an  ARB for renal  protection.

## 2010-11-17 ENCOUNTER — Other Ambulatory Visit: Payer: Self-pay | Admitting: Internal Medicine

## 2010-11-18 NOTE — Telephone Encounter (Signed)
Spoke with patient, patient states he has told [facility] he is no longer taking this medication. Patient states he is taking Zantac instead.  I called the pharmacy and informed them to remove, ok'd

## 2010-12-28 ENCOUNTER — Other Ambulatory Visit: Payer: Self-pay | Admitting: Internal Medicine

## 2010-12-28 NOTE — Telephone Encounter (Signed)
Patient needs to schedule labs for 12/2010 Lipid/Hep/TSH/A1c 250.00/244.9/272.4/995.20

## 2010-12-30 ENCOUNTER — Encounter (HOSPITAL_COMMUNITY): Payer: Self-pay

## 2010-12-31 ENCOUNTER — Encounter (HOSPITAL_COMMUNITY): Payer: Self-pay

## 2010-12-31 ENCOUNTER — Encounter (HOSPITAL_COMMUNITY)
Admission: RE | Admit: 2010-12-31 | Discharge: 2010-12-31 | Disposition: A | Discharge status: Home / Self Care | Payer: Medicare Other | Source: Ambulatory Visit | Attending: Orthopedic Surgery | Admitting: Orthopedic Surgery

## 2010-12-31 HISTORY — DX: Unspecified osteoarthritis, unspecified site: M19.90

## 2010-12-31 LAB — PROTIME-INR
INR: 1.02 (ref 0.00–1.49)
Prothrombin Time: 13.6 seconds (ref 11.6–15.2)

## 2010-12-31 LAB — COMPREHENSIVE METABOLIC PANEL
ALT: 20 U/L (ref 0–53)
AST: 23 U/L (ref 0–37)
Albumin: 4.1 g/dL (ref 3.5–5.2)
Alkaline Phosphatase: 69 U/L (ref 39–117)
BUN: 15 mg/dL (ref 6–23)
CO2: 25 mEq/L (ref 19–32)
Calcium: 9.5 mg/dL (ref 8.4–10.5)
Chloride: 97 mEq/L (ref 96–112)
Creatinine, Ser: 1.01 mg/dL (ref 0.50–1.35)
GFR calc Af Amer: 82 mL/min — ABNORMAL LOW (ref >90)
GFR calc non Af Amer: 71 mL/min — ABNORMAL LOW (ref >90)
Glucose, Bld: 102 mg/dL — ABNORMAL HIGH (ref 70–99)
Potassium: 4.6 mEq/L (ref 3.5–5.1)
Sodium: 133 mEq/L — ABNORMAL LOW (ref 135–145)
Total Bilirubin: 0.5 mg/dL (ref 0.3–1.2)
Total Protein: 7.2 g/dL (ref 6.0–8.3)

## 2010-12-31 LAB — CBC
HCT: 40.1 % (ref 39.0–52.0)
Hemoglobin: 13.8 g/dL (ref 13.0–17.0)
MCH: 29.4 pg (ref 26.0–34.0)
MCHC: 34.4 g/dL (ref 30.0–36.0)
MCV: 85.3 fL (ref 78.0–100.0)
Platelets: 225 10*3/uL (ref 150–400)
RBC: 4.7 MIL/uL (ref 4.22–5.81)
RDW: 13 % (ref 11.5–15.5)
WBC: 6.6 10*3/uL (ref 4.0–10.5)

## 2010-12-31 LAB — URINALYSIS, ROUTINE W REFLEX MICROSCOPIC
Bilirubin Urine: NEGATIVE
Glucose, UA: NEGATIVE mg/dL
Hgb urine dipstick: NEGATIVE
Ketones, ur: NEGATIVE mg/dL
Leukocytes, UA: NEGATIVE
Nitrite: NEGATIVE
Protein, ur: NEGATIVE mg/dL
Specific Gravity, Urine: 1.011 (ref 1.005–1.030)
Urobilinogen, UA: 1 mg/dL (ref 0.0–1.0)
pH: 6 (ref 5.0–8.0)

## 2010-12-31 LAB — DIFFERENTIAL
Basophils Absolute: 0 10*3/uL (ref 0.0–0.1)
Basophils Relative: 1 % (ref 0–1)
Eosinophils Absolute: 0.2 10*3/uL (ref 0.0–0.7)
Eosinophils Relative: 3 % (ref 0–5)
Lymphocytes Relative: 31 % (ref 12–46)
Lymphs Abs: 2 10*3/uL (ref 0.7–4.0)
Monocytes Absolute: 0.6 10*3/uL (ref 0.1–1.0)
Monocytes Relative: 9 % (ref 3–12)
Neutro Abs: 3.7 10*3/uL (ref 1.7–7.7)
Neutrophils Relative %: 56 % (ref 43–77)

## 2010-12-31 LAB — SURGICAL PCR SCREEN
MRSA, PCR: NEGATIVE
Staphylococcus aureus: POSITIVE — AB

## 2010-12-31 LAB — ABO/RH: ABO/RH(D): O POS

## 2010-12-31 LAB — APTT: aPTT: 35 seconds (ref 24–37)

## 2010-12-31 NOTE — Pre-Procedure Instructions (Signed)
20 Kyle Simon  12/31/2010   Your procedure is scheduled on:  01/11/2011  Report to [facility] at 0530 AM.  Call this number if you have problems the morning of surgery: [phone]   Remember:   Do not eat food:After Midnight.  Do not drink clear liquids: 4 Hours before arrival.  Take these medicines the morning of surgery with A SIP OF WATER:Zantac, flomax    Do not wear jewelry, make-up or nail polish.  Do not wear lotions, powders, or perfumes. You may wear deodorant.  Do not shave 48 hours prior to surgery.  Do not bring valuables to the hospital.  Contacts, dentures or bridgework may not be worn into surgery.  Leave suitcase in the car. After surgery it may be brought to your room.  For patients admitted to the hospital, checkout time is 11:00 AM the day of discharge.   Patients discharged the day of surgery will not be allowed to drive home.  Name and phone number of your driver:   Special Instructions: Incentive Spirometry - Practice and bring it with you on the day of surgery. and CHG Shower Use Special Wash: 1/2 bottle night before surgery and 1/2 bottle morning of surgery.   Please read over the following fact sheets that you were given: Pain Booklet, Coughing and Deep Breathing, Blood Transfusion Information, MRSA Information and Surgical Site Infection Prevention

## 2011-01-01 LAB — URINE CULTURE
Colony Count: NO GROWTH
Culture  Setup Time: 201211011903
Culture: NO GROWTH

## 2011-01-10 MED ORDER — CEFAZOLIN SODIUM-DEXTROSE 2-3 GM-% IV SOLR
2.0000 g | INTRAVENOUS | Status: DC
  Filled 2011-01-10: qty 50

## 2011-01-11 ENCOUNTER — Encounter (HOSPITAL_COMMUNITY): Payer: Self-pay | Admitting: *Deleted

## 2011-01-11 ENCOUNTER — Inpatient Hospital Stay (HOSPITAL_COMMUNITY): Payer: Medicare Other | Admitting: Anesthesiology

## 2011-01-11 ENCOUNTER — Inpatient Hospital Stay (HOSPITAL_COMMUNITY)
Admission: RE | Admit: 2011-01-11 | Discharge: 2011-01-14 | DRG: 470 | Disposition: A | Discharge status: Home Health | Payer: Medicare Other | Source: Ambulatory Visit | Attending: Orthopedic Surgery | Admitting: Orthopedic Surgery

## 2011-01-11 ENCOUNTER — Encounter (HOSPITAL_COMMUNITY)
Admission: RE | Disposition: A | Discharge status: Home Health | Payer: Self-pay | Source: Ambulatory Visit | Attending: Orthopedic Surgery

## 2011-01-11 ENCOUNTER — Encounter (HOSPITAL_COMMUNITY): Payer: Self-pay | Admitting: Anesthesiology

## 2011-01-11 DIAGNOSIS — M1712 Unilateral primary osteoarthritis, left knee: Secondary | ICD-10-CM

## 2011-01-11 DIAGNOSIS — I10 Essential (primary) hypertension: Secondary | ICD-10-CM | POA: Diagnosis present

## 2011-01-11 DIAGNOSIS — E039 Hypothyroidism, unspecified: Secondary | ICD-10-CM | POA: Diagnosis present

## 2011-01-11 DIAGNOSIS — N400 Enlarged prostate without lower urinary tract symptoms: Secondary | ICD-10-CM | POA: Diagnosis present

## 2011-01-11 DIAGNOSIS — E785 Hyperlipidemia, unspecified: Secondary | ICD-10-CM | POA: Diagnosis present

## 2011-01-11 DIAGNOSIS — Z23 Encounter for immunization: Secondary | ICD-10-CM

## 2011-01-11 DIAGNOSIS — E119 Type 2 diabetes mellitus without complications: Secondary | ICD-10-CM | POA: Diagnosis present

## 2011-01-11 DIAGNOSIS — D62 Acute posthemorrhagic anemia: Secondary | ICD-10-CM | POA: Diagnosis not present

## 2011-01-11 DIAGNOSIS — Z01818 Encounter for other preprocedural examination: Secondary | ICD-10-CM

## 2011-01-11 DIAGNOSIS — E876 Hypokalemia: Secondary | ICD-10-CM | POA: Diagnosis not present

## 2011-01-11 DIAGNOSIS — Z01812 Encounter for preprocedural laboratory examination: Secondary | ICD-10-CM

## 2011-01-11 DIAGNOSIS — M1710 Unilateral primary osteoarthritis, unspecified knee: Principal | ICD-10-CM | POA: Diagnosis present

## 2011-01-11 HISTORY — PX: TOTAL KNEE ARTHROPLASTY: SHX125

## 2011-01-11 LAB — GLUCOSE, CAPILLARY
Glucose-Capillary: 115 mg/dL — ABNORMAL HIGH (ref 70–99)
Glucose-Capillary: 125 mg/dL — ABNORMAL HIGH (ref 70–99)
Glucose-Capillary: 140 mg/dL — ABNORMAL HIGH (ref 70–99)
Glucose-Capillary: 180 mg/dL — ABNORMAL HIGH (ref 70–99)

## 2011-01-11 LAB — TYPE AND SCREEN
ABO/RH(D): O POS
Antibody Screen: NEGATIVE

## 2011-01-11 LAB — HEMOGLOBIN A1C
Hgb A1c MFr Bld: 6.6 % — ABNORMAL HIGH (ref <5.7)
Mean Plasma Glucose: 143 mg/dL — ABNORMAL HIGH (ref <117)

## 2011-01-11 SURGERY — ARTHROPLASTY, KNEE, TOTAL
Anesthesia: General | Site: Knee | Laterality: Left | Wound class: Clean

## 2011-01-11 MED ORDER — PROPOFOL 10 MG/ML IV EMUL
INTRAVENOUS | Status: DC | PRN
  Administered 2011-01-11: 30 mg via INTRAVENOUS
  Administered 2011-01-11: 170 mg via INTRAVENOUS

## 2011-01-11 MED ORDER — OXYCODONE HCL 10 MG PO TB12
10.0000 mg | ORAL_TABLET | Freq: Two times a day (BID) | ORAL | Status: DC
  Administered 2011-01-11 – 2011-01-12 (×3): 10 mg via ORAL
  Filled 2011-01-11 (×3): qty 1

## 2011-01-11 MED ORDER — ACETAMINOPHEN 10 MG/ML IV SOLN
INTRAVENOUS | Status: DC | PRN
  Administered 2011-01-11: 1000 mg via INTRAVENOUS

## 2011-01-11 MED ORDER — PHENYLEPHRINE HCL 10 MG/ML IJ SOLN
INTRAMUSCULAR | Status: DC | PRN
  Administered 2011-01-11: 40 ug via INTRAVENOUS
  Administered 2011-01-11: 80 ug via INTRAVENOUS

## 2011-01-11 MED ORDER — DOCUSATE SODIUM 100 MG PO CAPS
100.0000 mg | ORAL_CAPSULE | Freq: Two times a day (BID) | ORAL | Status: DC
  Administered 2011-01-11 – 2011-01-14 (×6): 100 mg via ORAL
  Filled 2011-01-11 (×7): qty 1

## 2011-01-11 MED ORDER — ACETAMINOPHEN 10 MG/ML IV SOLN
1000.0000 mg | Freq: Four times a day (QID) | INTRAVENOUS | Status: AC
  Administered 2011-01-11 – 2011-01-12 (×4): 1000 mg via INTRAVENOUS
  Filled 2011-01-11 (×4): qty 100

## 2011-01-11 MED ORDER — ONDANSETRON HCL 4 MG PO TABS: 4.0000 mg | ORAL_TABLET | Freq: Four times a day (QID) | ORAL | Status: DC | PRN

## 2011-01-11 MED ORDER — FLEET ENEMA 7-19 GM/118ML RE ENEM: 1.0000 | ENEMA | Freq: Every day | RECTAL | Status: DC | PRN

## 2011-01-11 MED ORDER — METHOCARBAMOL 500 MG PO TABS
500.0000 mg | ORAL_TABLET | Freq: Four times a day (QID) | ORAL | Status: DC | PRN
  Administered 2011-01-11 – 2011-01-14 (×8): 500 mg via ORAL
  Filled 2011-01-11 (×7): qty 1

## 2011-01-11 MED ORDER — SODIUM CHLORIDE 0.9 % IR SOLN
Status: DC | PRN
  Administered 2011-01-11: 1000 mL

## 2011-01-11 MED ORDER — ENOXAPARIN SODIUM 30 MG/0.3ML ~~LOC~~ SOLN
30.0000 mg | Freq: Two times a day (BID) | SUBCUTANEOUS | Status: DC
  Administered 2011-01-11 – 2011-01-14 (×6): 30 mg via SUBCUTANEOUS
  Filled 2011-01-11 (×8): qty 0.3

## 2011-01-11 MED ORDER — BUPIVACAINE ON-Q PAIN PUMP (FOR ORDER SET NO CHG)
INJECTION | Status: DC
  Filled 2011-01-11: qty 1

## 2011-01-11 MED ORDER — CEFAZOLIN SODIUM-DEXTROSE 2-3 GM-% IV SOLR
2.0000 g | Freq: Four times a day (QID) | INTRAVENOUS | Status: AC
  Administered 2011-01-11 – 2011-01-12 (×3): 2 g via INTRAVENOUS
  Filled 2011-01-11 (×3): qty 50

## 2011-01-11 MED ORDER — ROSUVASTATIN CALCIUM 5 MG PO TABS
5.0000 mg | ORAL_TABLET | Freq: Every day | ORAL | Status: DC
  Administered 2011-01-11 – 2011-01-13 (×3): 5 mg via ORAL
  Filled 2011-01-11 (×5): qty 1

## 2011-01-11 MED ORDER — OXYCODONE HCL 5 MG PO TABS
5.0000 mg | ORAL_TABLET | ORAL | Status: DC | PRN
  Administered 2011-01-11 – 2011-01-14 (×13): 10 mg via ORAL
  Filled 2011-01-11 (×13): qty 2

## 2011-01-11 MED ORDER — METOCLOPRAMIDE HCL 5 MG/ML IJ SOLN
5.0000 mg | Freq: Three times a day (TID) | INTRAMUSCULAR | Status: DC | PRN
  Filled 2011-01-11: qty 2

## 2011-01-11 MED ORDER — METHOCARBAMOL 100 MG/ML IJ SOLN
500.0000 mg | Freq: Four times a day (QID) | INTRAVENOUS | Status: DC | PRN
  Filled 2011-01-11: qty 5

## 2011-01-11 MED ORDER — BUPIVACAINE-EPINEPHRINE PF 0.5-1:200000 % IJ SOLN
INTRAMUSCULAR | Status: DC | PRN
  Administered 2011-01-11: 125 mg

## 2011-01-11 MED ORDER — METHOCARBAMOL 100 MG/ML IJ SOLN
500.0000 mg | INTRAVENOUS | Status: AC
  Administered 2011-01-11: 500 mg via INTRAVENOUS
  Filled 2011-01-11: qty 5

## 2011-01-11 MED ORDER — SODIUM CHLORIDE 0.9 % IV SOLN
INTRAVENOUS | Status: DC
  Administered 2011-01-11 – 2011-01-12 (×2): via INTRAVENOUS

## 2011-01-11 MED ORDER — CEFAZOLIN SODIUM 1-5 GM-% IV SOLN
INTRAVENOUS | Status: DC | PRN
  Administered 2011-01-11: 2 g via INTRAVENOUS

## 2011-01-11 MED ORDER — PROMETHAZINE HCL 25 MG/ML IJ SOLN: 6.2500 mg | INTRAMUSCULAR | Status: DC | PRN

## 2011-01-11 MED ORDER — DIPHENHYDRAMINE HCL 12.5 MG/5ML PO ELIX
12.5000 mg | ORAL_SOLUTION | ORAL | Status: DC | PRN
  Filled 2011-01-11: qty 10

## 2011-01-11 MED ORDER — LEVOTHYROXINE SODIUM 150 MCG PO TABS
150.0000 ug | ORAL_TABLET | Freq: Every day | ORAL | Status: DC
  Administered 2011-01-11 – 2011-01-14 (×4): 150 ug via ORAL
  Filled 2011-01-11 (×4): qty 1

## 2011-01-11 MED ORDER — LACTATED RINGERS IV SOLN
INTRAVENOUS | Status: DC | PRN
  Administered 2011-01-11 (×2): via INTRAVENOUS

## 2011-01-11 MED ORDER — HYDROMORPHONE HCL PF 1 MG/ML IJ SOLN
0.5000 mg | INTRAMUSCULAR | Status: DC | PRN
  Administered 2011-01-11 – 2011-01-12 (×4): 1 mg via INTRAVENOUS
  Filled 2011-01-11 (×5): qty 1

## 2011-01-11 MED ORDER — ONDANSETRON HCL 4 MG/2ML IJ SOLN: 4.0000 mg | Freq: Four times a day (QID) | INTRAMUSCULAR | Status: DC | PRN

## 2011-01-11 MED ORDER — FAMOTIDINE 10 MG PO TABS
10.0000 mg | ORAL_TABLET | Freq: Every day | ORAL | Status: DC
  Administered 2011-01-11 – 2011-01-14 (×4): 10 mg via ORAL
  Filled 2011-01-11 (×4): qty 1

## 2011-01-11 MED ORDER — TAMSULOSIN HCL 0.4 MG PO CAPS
0.4000 mg | ORAL_CAPSULE | Freq: Two times a day (BID) | ORAL | Status: DC
  Administered 2011-01-11 – 2011-01-14 (×7): 0.4 mg via ORAL
  Filled 2011-01-11 (×8): qty 1

## 2011-01-11 MED ORDER — METOCLOPRAMIDE HCL 10 MG PO TABS: 5.0000 mg | ORAL_TABLET | Freq: Three times a day (TID) | ORAL | Status: DC | PRN

## 2011-01-11 MED ORDER — FENTANYL CITRATE 0.05 MG/ML IJ SOLN
INTRAMUSCULAR | Status: DC | PRN
  Administered 2011-01-11: 100 ug via INTRAVENOUS
  Administered 2011-01-11: 25 ug via INTRAVENOUS
  Administered 2011-01-11: 50 ug via INTRAVENOUS
  Administered 2011-01-11: 25 ug via INTRAVENOUS

## 2011-01-11 MED ORDER — ZOLPIDEM TARTRATE 5 MG PO TABS
5.0000 mg | ORAL_TABLET | Freq: Every evening | ORAL | Status: DC | PRN
  Filled 2011-01-11: qty 1

## 2011-01-11 MED ORDER — BUPIVACAINE-EPINEPHRINE 0.25% -1:200000 IJ SOLN
INTRAMUSCULAR | Status: DC | PRN
  Administered 2011-01-11: 25 mL

## 2011-01-11 MED ORDER — MIDAZOLAM HCL 5 MG/5ML IJ SOLN
INTRAMUSCULAR | Status: DC | PRN
  Administered 2011-01-11: 2 mg via INTRAVENOUS

## 2011-01-11 MED ORDER — HYDROMORPHONE HCL PF 1 MG/ML IJ SOLN
0.2500 mg | INTRAMUSCULAR | Status: DC | PRN
  Administered 2011-01-11 (×4): 0.5 mg via INTRAVENOUS

## 2011-01-11 MED ORDER — INSULIN ASPART 100 UNIT/ML ~~LOC~~ SOLN
0.0000 [IU] | Freq: Three times a day (TID) | SUBCUTANEOUS | Status: DC
  Administered 2011-01-12: 3 [IU] via SUBCUTANEOUS
  Administered 2011-01-12 – 2011-01-14 (×7): 2 [IU] via SUBCUTANEOUS
  Filled 2011-01-11: qty 3

## 2011-01-11 MED ORDER — SENNOSIDES-DOCUSATE SODIUM 8.6-50 MG PO TABS
1.0000 | ORAL_TABLET | Freq: Every evening | ORAL | Status: DC | PRN
  Administered 2011-01-14: 1 via ORAL
  Filled 2011-01-11: qty 1

## 2011-01-11 SURGICAL SUPPLY — 56 items
BANDAGE ESMARK 6X9 LF (GAUZE/BANDAGES/DRESSINGS) ×1 IMPLANT
BLADE SAGITTAL 13X1.27X60 (BLADE) ×2 IMPLANT
BLADE SAW SGTL 83.5X18.5 (BLADE) ×2 IMPLANT
BNDG ESMARK 6X9 LF (GAUZE/BANDAGES/DRESSINGS) ×2
BOWL SMART MIX CTS (DISPOSABLE) ×2 IMPLANT
CATH KIT ON Q 10IN SLV (PAIN MANAGEMENT) ×2 IMPLANT
CEMENT BONE SIMPLEX SPEEDSET (Cement) ×4 IMPLANT
CLOTH BEACON ORANGE TIMEOUT ST (SAFETY) ×2 IMPLANT
COVER BACK TABLE 24X17X13 BIG (DRAPES) ×2 IMPLANT
COVER SURGICAL LIGHT HANDLE (MISCELLANEOUS) ×2 IMPLANT
CUFF TOURNIQUET SINGLE 34IN LL (TOURNIQUET CUFF) ×2 IMPLANT
DRAPE EXTREMITY T 121X128X90 (DRAPE) ×2 IMPLANT
DRAPE INCISE IOBAN 66X45 STRL (DRAPES) ×4 IMPLANT
DRAPE PROXIMA HALF (DRAPES) ×2 IMPLANT
DRAPE U-SHAPE 47X51 STRL (DRAPES) ×2 IMPLANT
DRSG ADAPTIC 3X8 NADH LF (GAUZE/BANDAGES/DRESSINGS) ×2 IMPLANT
DRSG PAD ABDOMINAL 8X10 ST (GAUZE/BANDAGES/DRESSINGS) ×2 IMPLANT
DURAPREP 26ML APPLICATOR (WOUND CARE) ×2 IMPLANT
ELECT REM PT RETURN 9FT ADLT (ELECTROSURGICAL) ×2
ELECTRODE REM PT RTRN 9FT ADLT (ELECTROSURGICAL) ×1 IMPLANT
EVACUATOR 1/8 PVC DRAIN (DRAIN) ×2 IMPLANT
GLOVE BIOGEL M 7.0 STRL (GLOVE) IMPLANT
GLOVE BIOGEL PI IND STRL 7.5 (GLOVE) IMPLANT
GLOVE BIOGEL PI IND STRL 8.5 (GLOVE) ×2 IMPLANT
GLOVE BIOGEL PI INDICATOR 7.5 (GLOVE)
GLOVE BIOGEL PI INDICATOR 8.5 (GLOVE) ×2
GLOVE SURG ORTHO 8.0 STRL STRW (GLOVE) ×4 IMPLANT
GOWN PREVENTION PLUS XLARGE (GOWN DISPOSABLE) ×4 IMPLANT
GOWN STRL NON-REIN LRG LVL3 (GOWN DISPOSABLE) ×4 IMPLANT
HANDPIECE INTERPULSE COAX TIP (DISPOSABLE) ×2
HOOD PEEL AWAY FACE SHEILD DIS (HOOD) ×8 IMPLANT
KIT BASIN OR (CUSTOM PROCEDURE TRAY) ×2 IMPLANT
KIT ROOM TURNOVER OR (KITS) ×2 IMPLANT
MANIFOLD NEPTUNE II (INSTRUMENTS) ×2 IMPLANT
NEEDLE 22X1 1/2 (OR ONLY) (NEEDLE) IMPLANT
NS IRRIG 1000ML POUR BTL (IV SOLUTION) ×2 IMPLANT
PACK TOTAL JOINT (CUSTOM PROCEDURE TRAY) ×2 IMPLANT
PAD ARMBOARD 7.5X6 YLW CONV (MISCELLANEOUS) ×2 IMPLANT
PADDING CAST COTTON 6X4 STRL (CAST SUPPLIES) ×2 IMPLANT
PADDING WEBRIL 4 STERILE (GAUZE/BANDAGES/DRESSINGS) ×2 IMPLANT
POSITIONER HEAD PRONE TRACH (MISCELLANEOUS) ×2 IMPLANT
SET HNDPC FAN SPRY TIP SCT (DISPOSABLE) ×2 IMPLANT
SPONGE GAUZE 4X4 12PLY (GAUZE/BANDAGES/DRESSINGS) ×2 IMPLANT
STAPLER VISISTAT 35W (STAPLE) ×2 IMPLANT
SUCTION FRAZIER TIP 10 FR DISP (SUCTIONS) ×2 IMPLANT
SUT BONE WAX W31G (SUTURE) ×2 IMPLANT
SUT VIC AB 0 CTB1 27 (SUTURE) ×4 IMPLANT
SUT VIC AB 1 CT1 27 (SUTURE) ×3
SUT VIC AB 1 CT1 27XBRD ANBCTR (SUTURE) ×3 IMPLANT
SUT VIC AB 2-0 CT1 27 (SUTURE) ×2
SUT VIC AB 2-0 CT1 TAPERPNT 27 (SUTURE) ×2 IMPLANT
SYR CONTROL 10ML LL (SYRINGE) IMPLANT
TOWEL OR 17X24 6PK STRL BLUE (TOWEL DISPOSABLE) ×2 IMPLANT
TOWEL OR 17X26 10 PK STRL BLUE (TOWEL DISPOSABLE) ×2 IMPLANT
TRAY FOLEY CATH 14FR (SET/KITS/TRAYS/PACK) IMPLANT
WATER STERILE IRR 1000ML POUR (IV SOLUTION) ×6 IMPLANT

## 2011-01-11 NOTE — Progress Notes (Signed)
Pt moving left toes; no numbness or tingling; sensation present; strong pulses; brisk cap. refill

## 2011-01-11 NOTE — Progress Notes (Signed)
Pt continues with brisk cap. Refill in left toes; strong palpable pulses; positive movement and sensation

## 2011-01-11 NOTE — Progress Notes (Signed)
CSW received consult today. CSW will assess pt tomorrow, as pt has surgery today. CSW will continue to assess clinical social work and discharge needs.   Dede Query, MSW, Theresia Majors [phone]

## 2011-01-11 NOTE — Progress Notes (Signed)
Data from admission could not be validated due to device select error.  Data had to be manually entered.

## 2011-01-11 NOTE — Progress Notes (Signed)
Continues to be able to move left toes; brisk cap refill; strong palpable pulses; no numbness or tingling in toes

## 2011-01-11 NOTE — Anesthesia Postprocedure Evaluation (Signed)
  Anesthesia Post-op Note  Patient: Kyle Simon  Procedure(s) Performed:  TOTAL KNEE ARTHROPLASTY  Patient Location: PACU  Anesthesia Type: GA combined with regional for post-op pain  Level of Consciousness: awake, alert  and oriented  Airway and Oxygen Therapy: Patient Spontanous Breathing  Post-op Pain: mild  Post-op Assessment: Post-op Vital signs reviewed, Patient's Cardiovascular Status Stable, Respiratory Function Stable, Patent Airway, No signs of Nausea or vomiting and Pain level controlled  Post-op Vital Signs: stable  Complications: No apparent anesthesia complications

## 2011-01-11 NOTE — Anesthesia Preprocedure Evaluation (Signed)
Anesthesia Evaluation  Patient identified by MRN, date of birth, ID band Patient awake    Reviewed: Allergy & Precautions, H&P , NPO status , Patient's Chart, lab work & pertinent test results  Airway Mallampati: II      Dental   Pulmonary    Pulmonary exam normal       Cardiovascular     Neuro/Psych    GI/Hepatic   Endo/Other  Diabetes mellitus-, Type 2Hypothyroidism   Renal/GU      Musculoskeletal   Abdominal Normal abdominal exam  (+)   Peds  Hematology   Anesthesia Other Findings   Reproductive/Obstetrics                           Anesthesia Physical Anesthesia Plan  ASA: III  Anesthesia Plan: General   Post-op Pain Management:    Induction: Intravenous  Airway Management Planned: LMA  Additional Equipment:   Intra-op Plan:   Post-operative Plan: Extubation in OR  Informed Consent: I have reviewed the patients History and Physical, chart, labs and discussed the procedure including the risks, benefits and alternatives for the proposed anesthesia with the patient or authorized representative who has indicated his/her understanding and acceptance.     Plan Discussed with: CRNA and Surgeon  Anesthesia Plan Comments:         Anesthesia Quick Evaluation

## 2011-01-11 NOTE — Progress Notes (Signed)
CPM 0-90 degrees begun on left knee

## 2011-01-11 NOTE — Anesthesia Procedure Notes (Signed)
Anesthesia Regional Block:  Femoral nerve block  Pre-Anesthetic Checklist: ,, timeout performed, Correct Patient, Correct Site, Correct Laterality, Correct Procedure, Correct Position, site marked, Risks and benefits discussed,  Surgical consent,  Pre-op evaluation,  At surgeon's request and post-op pain management  Laterality: Left  Prep: chloraprep       Needles:  Injection technique: Single-shot  Needle Type: Stimulator Needle - 80     Needle Length:cm 9 cm Needle Gauge: 22 and 22 G    Additional Needles:  Procedures: nerve stimulator Femoral nerve block  Nerve Stimulator or Paresthesia:  Response: 0.48 mA,   Additional Responses:   Narrative:  Start time: 01/11/2011 7:10 AM End time: 01/11/2011 7:22 AM  Performed by: Personally

## 2011-01-11 NOTE — Transfer of Care (Signed)
Immediate Anesthesia Transfer of Care Note  Patient: Kyle Simon  Procedure(s) Performed:  TOTAL KNEE ARTHROPLASTY  Patient Location: PACU  Anesthesia Type: General  Level of Consciousness: awake  Airway & Oxygen Therapy: Patient Spontanous Breathing and Patient connected to nasal cannula oxygen  Post-op Assessment: Report given to PACU RN, Post -op Vital signs reviewed and stable and Patient moving all extremities  Post vital signs: Reviewed and stable  Complications: No apparent anesthesia complications

## 2011-01-11 NOTE — Op Note (Signed)
TOTAL KNEE REPLACEMENT OPERATIVE NOTE:  01/11/2011  10:40 AM  PATIENT:  Kyle Simon  77 y.o. male  PRE-OPERATIVE DIAGNOSIS:  osteoarthritis  POST-OPERATIVE DIAGNOSIS:  osteoarthritis  PROCEDURE:  Procedure(s): TOTAL KNEE ARTHROPLASTY  SURGEON:  Surgeon(s): Raymon Mutton, MD  PHYSICIAN ASSISTANT: Altamese Cabal, [facility]  ANESTHESIA:   general   DRAINS: Hemovac and On-Q Marcaine Pain Pump  SPECIMEN: None  COUNTS:  Correct  TOURNIQUET:   Total Tourniquet Time Documented: Thigh (Left) - 59 minutes  DICTATION:  Indication for procedure:    The patient is a 77 y.o. male who has failed conservative treatment for osteoarthritis.  Informed consent was obtained prior to anesthesia. The risks versus benefits of the operation were explain and in a way the patient can, and did, understand.   Description of procedure:     The patient was taken to the operating room and placed under anesthesia.  The patient was positioned in the usual fashion taking care that all body parts were adequately padded and/or protected.  I foley catheter was not placed.  A tourniquet was applied and the leg prepped and draped in the usual sterile fashion.  The extremity was exsanguinated with the esmarch and tourniquet inflated to 350 mmHg.  Pre-operative range of motion was 130 degrees flexion.  The knee was in 5 degree of mild varus.  A midline incision approximately 6-7 inches long was made with a #10 blade.  A new blade was used to make a parapatellar arthrotomy going 2-3 cm into the quadriceps tendon, over the patella, and alongside the medial aspect of the patellar tendon.  A synovectomy was then performed with the #10 blade and forceps. I then elevated the deep MCL off the medial tibial metaphysis subperiosteally around to the semimembranosus attachment.    I everted the patella and used calipers to measure patellar thickness, which was ###.  I used the reamer to ream down to appropriate thickness to  recreated the native thickness.  I then removed excess bone with the rongeur and sagittal saw.  I used the ## mm template and drilled the three lug holes.  I then put the trial in place and measured the thickness with the calipers to ensure recreation of the native thickness.  The trial was then removed and the patella subluxed and the knee brought into flexion.  A homan retractor was place to retract and protect the patella and lateral structures.  A Z-retractor was place medially to protect the medial structures.  The extra-medullary alignment system was used to make cut the tibial articular surface perpendicular to the anamotic axis of the tibia and in 3 degrees of posterior slope.  The cut surface and alignment jig was removed.  I then used the intramedullary alignment guide to make a 6 valgus cut on the distal femur.  I then marked out the epicondylar axis on the distal femur.  The posterior condylar axis measured 3 degrees.  I then used the anterior referencing sizer and measured the femur to be a size E.  The 4-In-1 cutting block was screwed into place in external rotation matching the posterior condylar angle, making our cuts perpendicular to the epicondylar axis.  Anterior, posterior and chamfer cuts were made with the sagittal saw.  The cutting block and cut pieces were removed.  A lamina spreader was placed in 90 degrees of flexion.  The ACL, PCL, menisci, and posterior condylar osteophytes were removed.  A 10 mm spacer blocked was found to offer good  flexion and extension gap balance after moderate in degree releasing.   The scoop retractor was then placed and the femoral finishing block was pinned in place.  The small sagittal saw was used as well as the lug drill to finish the femur.  The block and cut surfaces were removed and the medullary canal hole filled with autograft bone from the cut pieces.  The tibia was delivered forward in deep flexion and external rotation.  A size 6 tray was  selected and pinned into place centered on the medial 1/3 of the tibial tubercle.  The reamer and keel was used to prepare the tibia through the tray.    I then trialed with the size E femur, size 6 tibia, a 10 mm insert and the 35 patella.  I had excellent flexion/extension gap balance, excellent patella tracking.  Flexion was full and beyond 120 degrees; extension was zero.  These components were chosen and the staff opened them to me on the back table while the knee was lavaged copiously and the cement mixed.  I cemented in the components and removed all excess cement.  The polyethylene tibial component was snapped into place and the knee placed in extension while cement was hardening.  The capsule was infilltrated with 20cc of .25% Marcaine with epinephrine.  A hemovac was place in the joint exiting superolaterally.  A pain pump was place superomedially superficial to the arthrotomy.  Once the cement was hard, the tourniquet was let down.  Hemostasis was obtained.  The arthrotomy was closed with figure-8 #1 vicryl sutures.  The deep soft tissues were closed with #0 vicryls and the subcuticular layer closed with a running #2-0 vicryl.  The skin was reapproximated and closed with skin staples.  The wound was dressed with xeroform, 4 x4's, 2 ABD sponges, a single layer of webril and a TED stocking.   The patient was then awakened, extubated, and taken to the recovery room in stable condition.  BLOOD LOSS:  300cc DRAINS: 1 hemovac, 1 pain catheter COMPLICATIONS:  None.  PLAN OF CARE: Admit to inpatient   PATIENT DISPOSITION:  PACU - hemodynamically stable.   Delay start of Pharmacological VTE agent (>24hrs) due to surgical blood loss or risk of bleeding:  no

## 2011-01-12 LAB — CBC
HCT: 27.1 % — ABNORMAL LOW (ref 39.0–52.0)
HCT: 27.1 % — ABNORMAL LOW (ref 39.0–52.0)
Hemoglobin: 9 g/dL — ABNORMAL LOW (ref 13.0–17.0)
Hemoglobin: 9 g/dL — ABNORMAL LOW (ref 13.0–17.0)
MCH: 28.5 pg (ref 26.0–34.0)
MCH: 28.2 pg (ref 26.0–34.0)
MCHC: 33.2 g/dL (ref 30.0–36.0)
MCHC: 33.2 g/dL (ref 30.0–36.0)
MCV: 85.8 fL (ref 78.0–100.0)
MCV: 85 fL (ref 78.0–100.0)
Platelets: 170 10*3/uL (ref 150–400)
Platelets: 176 10*3/uL (ref 150–400)
RBC: 3.16 MIL/uL — ABNORMAL LOW (ref 4.22–5.81)
RBC: 3.19 MIL/uL — ABNORMAL LOW (ref 4.22–5.81)
RDW: 12.8 % (ref 11.5–15.5)
RDW: 12.7 % (ref 11.5–15.5)
WBC: 7.6 10*3/uL (ref 4.0–10.5)
WBC: 7.8 10*3/uL (ref 4.0–10.5)

## 2011-01-12 LAB — BASIC METABOLIC PANEL
BUN: 8 mg/dL (ref 6–23)
CO2: 27 mEq/L (ref 19–32)
Calcium: 8.2 mg/dL — ABNORMAL LOW (ref 8.4–10.5)
Chloride: 95 mEq/L — ABNORMAL LOW (ref 96–112)
Creatinine, Ser: 0.81 mg/dL (ref 0.50–1.35)
GFR calc Af Amer: >90 mL/min (ref >90)
GFR calc non Af Amer: 85 mL/min — ABNORMAL LOW (ref >90)
Glucose, Bld: 123 mg/dL — ABNORMAL HIGH (ref 70–99)
Potassium: 4 mEq/L (ref 3.5–5.1)
Sodium: 129 mEq/L — ABNORMAL LOW (ref 135–145)

## 2011-01-12 LAB — GLUCOSE, CAPILLARY
Glucose-Capillary: 128 mg/dL — ABNORMAL HIGH (ref 70–99)
Glucose-Capillary: 157 mg/dL — ABNORMAL HIGH (ref 70–99)
Glucose-Capillary: 121 mg/dL — ABNORMAL HIGH (ref 70–99)
Glucose-Capillary: 132 mg/dL — ABNORMAL HIGH (ref 70–99)

## 2011-01-12 MED ORDER — OXYCODONE HCL 20 MG PO TB12
20.0000 mg | ORAL_TABLET | Freq: Two times a day (BID) | ORAL | Status: DC
  Administered 2011-01-13 – 2011-01-14 (×3): 20 mg via ORAL
  Filled 2011-01-12 (×3): qty 1

## 2011-01-12 NOTE — Progress Notes (Signed)
Occupational Therapy Evaluation Patient Details Name: ACY ORSAK MRN: [id number] DOB: 05-09-1932 Today's Date: 01/12/2011  Problem List:  Patient Active Problem List  Diagnoses  . HYPOTHYROIDISM  . DIABETES MELLITUS, TYPE II  . HYPERLIPIDEMIA  . SENILE KERATOSIS  . ADVEF, DRUG/MEDICINAL/BIOLOGICAL SUBST NOS  . ARTHUS PHENOMENON  . BENIGN PROSTATIC HYPERTROPHY, HX OF  . SNORING, HX OF  . HYPOKALEMIA, MILD  . Abnormal CK-MB  . Chest pain    Past Medical History:  Past Medical History  Diagnosis Date  . Hyperlipidemia   . Snoring     no sleep apnea  . Polio     as a child w/o complications  . Pneumonia 1980's    past hx., has had exposure to agent orange   . Diabetes mellitus     adult onset  . Hypothyroidism   . Arthritis     BOTH KNEES   Past Surgical History:  Past Surgical History  Procedure Date  . Uvulectomy 1996    Dr. Lazarus Salines  . Inguinal hernia repair 2002    right, Dr. Luan Pulling  . Tonsillectomy   . Prostate surgery 07/2006    for urinary urgency, Dr. Isabel Caprice  . Vasectomy   . Lipoma excision     back  . Wrist surgery 1959    fracture  . Sinus & throat surgery-1995   . Meniscectomy     Dr. Thurston Hole  . Cardiac catheterization 09/2001  . Eye surgery 2003    R&L cataract removed & IOL- 2003 & 2007  . Back surgery 2011    cyst removed -lumbar/sciatic    OT Assessment/Plan/Recommendation OT Assessment Clinical Impression Statement: Pt will benefit from OT services in the acute care setting to increase I with ADLs and to increase I with functional transfers in prep for d/c home with wife and daughter. OT Recommendation/Assessment: Patient will need skilled OT in the acute care venue OT Problem List: Decreased activity tolerance;Decreased knowledge of use of DME or AE;Pain OT Therapy Diagnosis : Acute pain OT Plan OT Frequency: Min 2X/week OT Treatment/Interventions: Self-care/ADL training;DME and/or AE instruction;Patient/family  education;Therapeutic activities OT Recommendation Follow Up Recommendations: None Equipment Recommended: None recommended by OT Individuals Consulted Consulted and Agree with Results and Recommendations: Patient OT Goals Acute Rehab OT Goals OT Goal Formulation: With patient Time For Goal Achievement: 7 days ADL Goals Pt Will Perform Grooming: with modified independence;Standing at sink ADL Goal: Grooming - Progress: Other (comment) Pt Will Perform Lower Body Bathing: with modified independence;Sit to stand from bed ADL Goal: Lower Body Bathing - Progress: Other (comment) Pt Will Perform Lower Body Dressing: with modified independence;Sit to stand from bed ADL Goal: Lower Body Dressing - Progress: Other (comment) Pt Will Transfer to Toilet: with modified independence;Ambulation;with DME;3-in-1 ADL Goal: Toilet Transfer - Progress: Other (comment) Pt Will Perform Toileting - Clothing Manipulation: with modified independence;Standing ADL Goal: Toileting - Clothing Manipulation - Progress: Other (comment)  OT Evaluation Precautions/Restrictions  Restrictions Weight Bearing Restrictions: Yes LLE Weight Bearing: Weight bearing as tolerated Prior Functioning Home Living Lives With: Spouse;Other (Comment) (grandaughter) Receives Help From: Family Type of Home: House Home Layout: Two level;Full bath on main level (Pt reports that he uses ramp outside to access second level.) Home Access: Ramped entrance Bathroom Shower/Tub: Walk-in shower Bathroom Toilet: Handicapped height Home Adaptive Equipment: Walker - rolling;Bedside commode/3-in-1 (cpm already delivered to pt's home) Prior Function Level of Independence: Independent with basic ADLs;Independent with homemaking with ambulation;Independent with gait (golfs 3x/week, volunteers here at [facility])  Able to Take Stairs?: Yes Driving: Yes 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 Leisure:  Hobbies-yes (Comment) Comments: Plays golf 3x a week. ADL ADL Eating/Feeding: Simulated;Independent Where Assessed - Eating/Feeding: Chair Grooming: Simulated;Independent Where Assessed - Grooming: Sitting, chair Upper Body Bathing: Simulated;Set up Upper Body Bathing Details (indicate cue type and reason): setup assist to gather bathing materials. Where Assessed - Upper Body Bathing: Sitting, bed Lower Body Bathing: Simulated;Minimal assistance Where Assessed - Lower Body Bathing: Sit to stand from chair Upper Body Dressing: Simulated;Independent Where Assessed - Upper Body Dressing: Sitting, bed Lower Body Dressing: Performed;Minimal assistance Lower Body Dressing Details (indicate cue type and reason): Pt required min assist to pull undergarment up over hips. Where Assessed - Lower Body Dressing: Sit to stand from bed Toilet Transfer: Simulated;Minimal assistance;Other (comment) (VC for sequencing) Toilet Transfer Details (indicate cue type and reason): Pt transferred from bed to chair. Toilet Transfer Method: Ambulating Toileting - Clothing Manipulation: Simulated;Minimal assistance Where Assessed - Glass blower/designer Manipulation: Standing Toileting - Hygiene: Not assessed Tub/Shower Transfer: Not assessed Equipment Used: Rolling walker ADL Comments: Pt with decreased I with ADLs due to increase pain in L LE and decreased activity tolerance. Vision/Perception    Cognition Cognition Arousal/Alertness: Awake/alert Overall Cognitive Status: Appears within functional limits for tasks assessed Orientation Level: Oriented X4 Sensation/Coordination Sensation Light Touch: Appears Intact (left LE) Extremity Assessment RUE Assessment RUE Assessment: Within Functional Limits LUE Assessment LUE Assessment: Within Functional Limits Mobility  Bed Mobility Bed Mobility: Yes Supine to Sit: 4: Min assist Supine to Sit Details (indicate cue type and reason): for technique, and min  for left LE Sit to Supine - Right: Not Tested (comment) Sit to Supine - Left: Not tested (comment) Transfers Transfers: Yes Sit to Stand: 4: Min assist Sit to Stand Details (indicate cue type and reason): vc's for hand placement , manual cues to straighten left LE Stand to Sit: 4: Min assist Stand to Sit Details: cues for hand placement, Left LE placement Exercises Total Joint Exercises Ankle Circles/Pumps: AROM;Both;10 reps Quad Sets: AROM;Left;5 reps End of Session OT - End of Session Equipment Utilized During Treatment: Gait belt Activity Tolerance: Patient limited by pain Patient left: in chair;with call bell in reach;with family/visitor present General Behavior During Session: [facility] for tasks performed Cognition: [facility] for tasks performed   Cipriano Mile 01/12/2011, 11:56 AM  01/12/2011 Cipriano Mile OTR/L Pager [phone] Office [phone]

## 2011-01-12 NOTE — Progress Notes (Signed)
PATIENT ID:      Kyle Simon  MRN:     [id number] DOB/AGE:    [birth year] / 77 y.o.    PROGRESS NOTE Subjective:  negative for Chest Pain  negative for Shortness of Breath  negative for Nausea/Vomiting   negative for Calf Pain  negative for Bowel Movement   Tolerating Diet: yes         Patient reports pain as 5 on 0-10 scale.    Objective: Vital signs in last 24 hours:  Patient Vitals for the past 24 hrs:  BP Temp Temp src Pulse Resp SpO2  01/12/11 0607 138/69 mmHg 98.6 F (37 C) - 60  18  98 %  01/11/11 2208 135/75 mmHg 98.5 F (36.9 C) - 68  18  97 %  01/11/11 1400 147/87 mmHg 97.3 F (36.3 C) Oral 56  18  -  01/11/11 1351 - - - - - 0 %  01/11/11 1203 158/72 mmHg - - 50  29  100 %  01/11/11 1200 - 97.2 F (36.2 C) - - - -  01/11/11 1148 142/76 mmHg - - 47  22  100 %  01/11/11 1145 153/69 mmHg - - - - -  01/11/11 1130 138/83 mmHg - - - - -  01/11/11 1115 147/74 mmHg - - - - -  01/11/11 1100 143/82 mmHg - - - - -  01/11/11 1045 140/80 mmHg - - - - -  01/11/11 1030 141/81 mmHg - - - - -  01/11/11 1015 133/71 mmHg - - - - -  01/11/11 1000 - 97.6 F (36.4 C) - 53  20  -      Intake/Output from previous day:   11/12 0701 - 11/13 0700 In: 2800 [I.V.:2800] Out: 1300 [Drains:1300]   Intake/Output this shift:       Intake/Output      11/12 0701 - 11/13 0700 11/13 0701 - 11/14 0700   I.V. 2800    Total Intake 2800    Drains 1300    Total Output 1300    Net +1500            LABORATORY DATA:  Basename 01/12/11 0715  WBC 7.6  HGB 9.0*  HCT 27.1*  PLT 170    Basename 01/12/11 0715  NA 129*  K 4.0  CL 95*  CO2 27  BUN 8  CREATININE 0.81  GLUCOSE 123*  CALCIUM 8.2*   Lab Results  Component Value Date   INR 1.02 12/31/2010    Examination:  General appearance: alert and cooperative Extremities: extremities normal, atraumatic, no cyanosis or edema and Homans sign is negative, no sign of DVT  Wound Exam: clean, dry, intact   Drainage:  None: wound  tissue dry  Motor Exam EHL and FHL Intact  Sensory Exam Superficial Peroneal, Deep Peroneal and Tibial normal  Assessment:    1 Day Post-Op  Procedure(s) (LRB): TOTAL KNEE ARTHROPLASTY (Left)  ADDITIONAL DIAGNOSIS:  Active Problems:  * No active hospital problems. *   Acute Blood Loss Anemia   Plan: Physical Therapy as ordered Weight Bearing as Tolerated (WBAT)  DVT Prophylaxis:  Lovenox  DISCHARGE PLAN: Home  DISCHARGE NEEDS: HHPT, CPM, Walker and 3-in-1 comode seat         Kyle Simon 01/12/2011, 8:47 AM

## 2011-01-12 NOTE — Progress Notes (Signed)
CSW received consult. For full assessment, please see Clinical Social Work Assessment in pt's chart. CSW is signing off, as no social work needs identified. Pt plans to discharge home with home health services. CSW handed off to [facility]. Please reconsult if a clinical social work need arises prior to discharge.   Dede Query, MSW, Theresia Majors [phone]

## 2011-01-12 NOTE — Progress Notes (Signed)
Pt. recently back to bed with nursing and did not want to get back OOB with PT.  Pt. C/o pain in left knee while in CPM currently and reported that he had been comfortable in it tearlier.  Checked  alignment and made  appropriate adjustments for pt. t o be more comfortable.  Pt was slightly externally rotated , causing the discomfort.  Wife present, educated both of them on proper alignment strategies for use of CPM at home. Advised pt. To request to get OOB with nursing for dinner meal.  Will see in am and progress as pt.able. Weldon Picking Acute Rehabilitation Services [phone] (pager)

## 2011-01-12 NOTE — Progress Notes (Signed)
Physical Therapy Evaluation Patient Details Name: Kyle Simon MRN: [id number] DOB: Sep 26, 1932 Today's Date: 01/12/2011  Problem List:  Patient Active Problem List  Diagnoses  . HYPOTHYROIDISM  . DIABETES MELLITUS, TYPE II  . HYPERLIPIDEMIA  . SENILE KERATOSIS  . ADVEF, DRUG/MEDICINAL/BIOLOGICAL SUBST NOS  . ARTHUS PHENOMENON  . BENIGN PROSTATIC HYPERTROPHY, HX OF  . SNORING, HX OF  . HYPOKALEMIA, MILD  . Abnormal CK-MB  . Chest pain    Past Medical History:  Past Medical History  Diagnosis Date  . Hyperlipidemia   . Snoring     no sleep apnea  . Polio     as a child w/o complications  . Pneumonia 1980's    past hx., has had exposure to agent orange   . Diabetes mellitus     adult onset  . Hypothyroidism   . Arthritis     BOTH KNEES   Past Surgical History:  Past Surgical History  Procedure Date  . Uvulectomy 1996    Dr. Lazarus Salines  . Inguinal hernia repair 2002    right, Dr. Luan Pulling  . Tonsillectomy   . Prostate surgery 07/2006    for urinary urgency, Dr. Isabel Caprice  . Vasectomy   . Lipoma excision     back  . Wrist surgery 1959    fracture  . Sinus & throat surgery-1995   . Meniscectomy     Dr. Thurston Hole  . Cardiac catheterization 09/2001  . Eye surgery 2003    R&L cataract removed & IOL- 2003 & 2007  . Back surgery 2011    cyst removed -lumbar/sciatic    PT Assessment/Plan/Recommendation PT Assessment Clinical Impression Statement: Pt. is s/p TKR and will need acute PT  to maximize functional mobility and gait, strength and ROM for DC home with family. PT Recommendation/Assessment: Patient will need skilled PT in the acute care venue PT Problem List: Decreased strength;Decreased range of motion;Decreased activity tolerance;Decreased mobility;Pain Barriers to Discharge: None PT Therapy Diagnosis : Difficulty walking;Acute pain PT Plan PT Frequency: 7X/week PT Treatment/Interventions: DME instruction;Gait training;Functional mobility  training;Therapeutic exercise;Patient/family education PT Recommendation Follow Up Recommendations: Home health PT Equipment Recommended: None recommended by PT (equipment delivered to home already) PT Goals  Acute Rehab PT Goals PT Goal Formulation: With patient Time For Goal Achievement: 7 days Pt will go Supine/Side to Sit: with modified independence PT Goal: Supine/Side to Sit - Progress: Progressing toward goal Pt will go Sit to Supine/Side: with modified independence PT Goal: Sit to Supine/Side - Progress: Not met Pt will Transfer Sit to Stand/Stand to Sit: with modified independence PT Transfer Goal: Sit to Stand/Stand to Sit - Progress: Progressing toward goal Pt will Ambulate: 51 - 150 feet;with modified independence;with rolling walker PT Goal: Ambulate - Progress: Progressing toward goal Pt will Perform Home Exercise Program: with min assist PT Goal: Perform Home Exercise Program - Progress: Progressing toward goal  PT Evaluation Precautions/Restrictions  Restrictions Weight Bearing Restrictions: Yes LLE Weight Bearing: Weight bearing as tolerated Prior Functioning  Home Living Lives With: Spouse;Other (Comment) (grandaughter) Receives Help From: Family Type of Home: House Home Layout: Two level;Full bath on main level (Pt reports that he uses ramp outside to access second level.) Home Access: Ramped entrance Bathroom Shower/Tub: Walk-in shower Bathroom Toilet: Handicapped height Home Adaptive Equipment: Walker - rolling;Bedside commode/3-in-1 (cpm already delivered to pt's home) Prior Function Level of Independence: Independent with basic ADLs;Independent with homemaking with ambulation;Independent with gait (golfs 3x/week, volunteers here at [facility]) Able to Take Stairs?: Yes Driving:  Yes 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 Cognition Cognition Arousal/Alertness: Awake/alert Overall Cognitive Status: Appears within functional limits for tasks assessed Orientation Level: Oriented X4 Sensation/Coordination Sensation Light Touch: Appears Intact (left LE) Extremity Assessment RUE Assessment RUE Assessment: Within Functional Limits LUE Assessment LUE Assessment: Within Functional Limits RLE Assessment RLE Assessment: Within Functional Limits LLE Assessment LLE Assessment: Exceptions to WFL LLE AROM (degrees) Left Knee Extension 0-130: -20  Left Knee Flexion 0-140: 85  LLE Strength Left Knee Extension: 2-/5 Left Ankle Dorsiflexion: 4/5 Mobility (including Balance) Bed Mobility Bed Mobility: Yes Supine to Sit: 4: Min assist Supine to Sit Details (indicate cue type and reason): for technique, and min for left LE Sit to Supine - Right: Not Tested (comment) Sit to Supine - Left: Not tested (comment) Transfers Transfers: Yes Sit to Stand: 4: Min assist Sit to Stand Details (indicate cue type and reason): vc's for hand placement , manual cues to straighten left LE Stand to Sit: 4: Min assist Stand to Sit Details: cues for hand placement, Left LE placement Ambulation/Gait Ambulation/Gait: Yes Ambulation/Gait Assistance: 4: Min assist Ambulation/Gait Assistance Details (indicate cue type and reason): multimodal cues for step length, sequence and to contract left LE during stance phase Ambulation Distance (Feet): 8 Feet Assistive device: Rolling walker    Exercise  Total Joint Exercises Ankle Circles/Pumps: AROM;Both;10 reps Quad Sets: AROM;Left;5 reps End of Session PT - End of Session Equipment Utilized During Treatment: Gait belt (RW) Activity Tolerance: Patient limited by fatigue;Patient limited by pain Patient left: in chair;with family/visitor present (granddaughter) Nurse Communication: Weight bearing status (WBAT) General Behavior During Session: [facility] for tasks performed Cognition:  [facility] for tasks performed  Ferman Hamming 01/12/2011, 12:00 PM Acute Rehabilitation Services [phone] (pager) Acute Rehabilitation Services

## 2011-01-13 ENCOUNTER — Encounter (HOSPITAL_COMMUNITY): Payer: Self-pay | Admitting: Orthopedic Surgery

## 2011-01-13 LAB — GLUCOSE, CAPILLARY
Glucose-Capillary: 124 mg/dL — ABNORMAL HIGH (ref 70–99)
Glucose-Capillary: 134 mg/dL — ABNORMAL HIGH (ref 70–99)
Glucose-Capillary: 124 mg/dL — ABNORMAL HIGH (ref 70–99)
Glucose-Capillary: 169 mg/dL — ABNORMAL HIGH (ref 70–99)

## 2011-01-13 LAB — CBC
HCT: 24.2 % — ABNORMAL LOW (ref 39.0–52.0)
HCT: 27.1 % — ABNORMAL LOW (ref 39.0–52.0)
Hemoglobin: 8.1 g/dL — ABNORMAL LOW (ref 13.0–17.0)
Hemoglobin: 9.1 g/dL — ABNORMAL LOW (ref 13.0–17.0)
MCH: 28.6 pg (ref 26.0–34.0)
MCH: 28.2 pg (ref 26.0–34.0)
MCHC: 33.5 g/dL (ref 30.0–36.0)
MCHC: 33.6 g/dL (ref 30.0–36.0)
MCV: 85.5 fL (ref 78.0–100.0)
MCV: 83.9 fL (ref 78.0–100.0)
Platelets: 165 10*3/uL (ref 150–400)
Platelets: 170 10*3/uL (ref 150–400)
RBC: 2.83 MIL/uL — ABNORMAL LOW (ref 4.22–5.81)
RBC: 3.23 MIL/uL — ABNORMAL LOW (ref 4.22–5.81)
RDW: 12.8 % (ref 11.5–15.5)
RDW: 13 % (ref 11.5–15.5)
WBC: 7.8 10*3/uL (ref 4.0–10.5)
WBC: 8.4 10*3/uL (ref 4.0–10.5)

## 2011-01-13 LAB — BASIC METABOLIC PANEL
BUN: 8 mg/dL (ref 6–23)
CO2: 26 mEq/L (ref 19–32)
Calcium: 8.3 mg/dL — ABNORMAL LOW (ref 8.4–10.5)
Chloride: 92 mEq/L — ABNORMAL LOW (ref 96–112)
Creatinine, Ser: 0.83 mg/dL (ref 0.50–1.35)
GFR calc Af Amer: >90 mL/min (ref >90)
GFR calc non Af Amer: 84 mL/min — ABNORMAL LOW (ref >90)
Glucose, Bld: 124 mg/dL — ABNORMAL HIGH (ref 70–99)
Potassium: 4.2 mEq/L (ref 3.5–5.1)
Sodium: 125 mEq/L — ABNORMAL LOW (ref 135–145)

## 2011-01-13 LAB — PREPARE RBC (CROSSMATCH)

## 2011-01-13 NOTE — Progress Notes (Signed)
Physical Therapy Treatment Patient Details Name: Kyle Simon MRN: [id number] DOB: [birth year] Today's Date: 01/13/2011  PT Assessment/Plan  PT - Assessment/Plan Comments on Treatment Session: Pt did well with increasing ambulation. PT Plan: Discharge plan remains appropriate PT Frequency: 7X/week Follow Up Recommendations: Home health PT PT Goals  Acute Rehab PT Goals PT Goal: Supine/Side to Sit - Progress: Met PT Transfer Goal: Sit to Stand/Stand to Sit - Progress: Progressing toward goal PT Goal: Ambulate - Progress: Progressing toward goal PT Goal: Perform Home Exercise Program - Progress: Progressing toward goal  PT Treatment Precautions/Restrictions  Precautions Precautions: Knee Required Braces or Orthoses: No Restrictions Weight Bearing Restrictions: Yes LLE Weight Bearing: Weight bearing as tolerated Mobility (including Balance) Bed Mobility Supine to Sit: 6: Modified independent (Device/Increase time) Transfers Transfers: Yes Sit to Stand: 4: Min assist Sit to Stand Details (indicate cue type and reason): Cues for safe hand placement Stand to Sit: 5: Supervision Stand to Sit Details: cues to slide out LLE Ambulation/Gait Ambulation/Gait: Yes Ambulation/Gait Assistance: Other (comment) (MinGuard) Ambulation/Gait Assistance Details (indicate cue type and reason): Cues for posture and gait sequence Ambulation Distance (Feet): 70 Feet Assistive device: Rolling walker Gait Pattern: Decreased step length - right;Decreased step length - left;Trunk flexed Stairs: No Wheelchair Mobility Wheelchair Mobility: No    Exercise  Total Joint Exercises Heel Slides: AAROM;Strengthening;Left;10 reps;Seated Hip ABduction/ADduction: Seated;10 reps;Left;Strengthening;AAROM Straight Leg Raises: AAROM;Strengthening;Left;10 reps;Seated End of Session PT - End of Session Equipment Utilized During Treatment: Gait belt Activity Tolerance: Patient tolerated treatment  well Patient left: in chair;with call bell in reach Nurse Communication: Mobility status for transfers;Mobility status for ambulation General Behavior During Session: [facility] for tasks performed Cognition: [facility] for tasks performed  Yittel Emrich, Adline Potter 01/13/2011, 9:40 AM 01/13/2011 Fredrich Birks PTA [phone] pager [phone] office

## 2011-01-13 NOTE — Clinical Documentation Improvement (Signed)
Please update your documentation within the medical record to reflect your response to this query.                                                                                   01/13/11  Dear Dr. Tobin Chad Marton Redwood  In a better effort to capture your patient's severity of illness, reflect appropriate length of stay and utilization of resources, a review of the medical record has revealed the following indicators.    Based on your clinical judgment, please clarify and document in a progress note and/or discharge summary the clinical condition associated with the following supporting information:  In responding to this query please exercise your independent judgment.  The fact that a query is asked, does not imply that any particular answer is desired or expected.  Abnormal findings (laboratory, x-ray, pathologic, and other diagnostic results) are not coded and reported unless the physician indicates their clinical significance.   The medical record reflects the following clinical findings, please clarify the diagnostic and/or clinical significance:      Noted admit NA 133 11/1...then NA 129 11/13 ...Marland KitchenMarland KitchenNA 125 11/14 post op.  Please clarify secondary dx if appropriate . Thank you  Possible Clinical Conditions?                           Supporting Information:  Hyponatremia                                               Lab Test: BMET  Hyposmolality                                                        Risk Factors: s/p lt knee replacement  SIADH                                                                    Patient Values :NA 133 11/1.Marland KitchenMarland KitchenNA 129 11/13 ...Marland KitchenMarland KitchenNA 125 11/14                      Other Condition__________________                  Treatment: NS @ 42ml/hr  Cannot Clinically Determine_________      Reviewed:  Reviewed d/c summary/ not responded to in progress notes either    Thank You,  Mayetta Castleman, Leonette Most  Clinical Documentation Specialist Office, RN, BSN Pager; [phone]

## 2011-01-13 NOTE — Progress Notes (Signed)
Physical Therapy Treatment Patient Details Name: Kyle Simon MRN: [id number] DOB: 1932/06/28 Today's Date: 01/13/2011  PT Assessment/Plan  PT - Assessment/Plan Comments on Treatment Session: Pt declined getting back into bed. Pt receiving blood at this time.  PT Plan: Discharge plan remains appropriate PT Frequency: 7X/week Follow Up Recommendations: Home health PT Equipment Recommended: None recommended by PT PT Goals  Acute Rehab PT Goals PT Goal: Perform Home Exercise Program - Progress: Progressing toward goal  PT Treatment Precautions/Restrictions  Precautions Precautions: Knee Required Braces or Orthoses: No Restrictions Weight Bearing Restrictions: Yes LLE Weight Bearing: Weight bearing as tolerated Mobility (including Balance)      Exercise  Total Joint Exercises Ankle Circles/Pumps: AROM;Left;10 reps;Supine Quad Sets: AROM;Strengthening;Left;10 reps;Supine Short Arc Quad: AAROM;Strengthening;Left;10 reps;Supine Heel Slides: Supine;Left;10 reps;Strengthening;AAROM Hip ABduction/ADduction: AAROM;Strengthening;Left;10 reps;Supine Straight Leg Raises: AAROM;Strengthening;Left;10 reps;Supine End of Session PT - End of Session Activity Tolerance: Patient tolerated treatment well Patient left: in bed;with call bell in reach  Fredrich Birks 01/13/2011, 3:07 PM 01/13/2011 Fredrich Birks PTA [phone] pager [phone] office

## 2011-01-13 NOTE — Progress Notes (Signed)
PATIENT ID:      Kyle Simon  MRN:     [id number] DOB/AGE:    [birth year] / 77 y.o.    PROGRESS NOTE Subjective:  negative for Chest Pain  negative for Shortness of Breath  negative for Nausea/Vomiting   negative for Calf Pain  negative for Bowel Movement   Tolerating Diet: yes         Patient reports pain as 3 on 0-10 scale.    Objective: Vital signs in last 24 hours:  Patient Vitals for the past 24 hrs:  BP Temp Temp src Pulse Resp SpO2  01/13/11 1100 139/70 mmHg 98.7 F (37.1 C) Oral 68  12  -  01/13/11 0611 126/68 mmHg 99.8 F (37.7 C) - 90  19  100 %  01/12/11 2234 142/76 mmHg 100.2 F (37.9 C) - 82  19  95 %  01/12/11 1353 151/83 mmHg 97 F (36.1 C) Oral 60  18  98 %      Intake/Output from previous day:   11/13 0701 - 11/14 0700 In: 480 [P.O.:480] Out: 1950 [Urine:1950]   Intake/Output this shift:       Intake/Output      11/13 0701 - 11/14 0700 11/14 0701 - 11/15 0700   P.O. 480    I.V.     Total Intake 480    Urine 1950    Drains     Total Output 1950    Net -1470            LABORATORY DATA:  Basename 01/13/11 0612 01/12/11 1716 01/12/11 0715  WBC 7.8 7.8 7.6  HGB 8.1* 9.0* 9.0*  HCT 24.2* 27.1* 27.1*  PLT 165 176 170    Basename 01/13/11 0612 01/12/11 0715  NA 125* 129*  K 4.2 4.0  CL 92* 95*  CO2 26 27  BUN 8 8  CREATININE 0.83 0.81  GLUCOSE 124* 123*  CALCIUM 8.3* 8.2*   Lab Results  Component Value Date   INR 1.02 12/31/2010    Examination:  General appearance: alert and cooperative Extremities: extremities normal, atraumatic, no cyanosis or edema and Homans sign is negative, no sign of DVT  Wound Exam: clean, dry, intact   Drainage:  None: wound tissue dry  Motor Exam EHL and FHL Intact  Sensory Exam Tibial normal  Assessment:    2 Days Post-Op  Procedure(s) (LRB): TOTAL KNEE ARTHROPLASTY (Left)  ADDITIONAL DIAGNOSIS:  Active Problems:  * No active hospital problems. *   Acute Blood Loss  Anemia   Plan: Physical Therapy as ordered Weight Bearing as Tolerated (WBAT)  DVT Prophylaxis:  Lovenox  DISCHARGE PLAN: Home  DISCHARGE NEEDS: HHPT, CPM, Walker and 3-in-1 comode seat         Ramin Zoll 01/13/2011, 11:55 AM

## 2011-01-14 LAB — CBC
HCT: 25.9 % — ABNORMAL LOW (ref 39.0–52.0)
Hemoglobin: 8.8 g/dL — ABNORMAL LOW (ref 13.0–17.0)
MCH: 28.8 pg (ref 26.0–34.0)
MCHC: 34 g/dL (ref 30.0–36.0)
MCV: 84.6 fL (ref 78.0–100.0)
Platelets: 184 10*3/uL (ref 150–400)
RBC: 3.06 MIL/uL — ABNORMAL LOW (ref 4.22–5.81)
RDW: 13.4 % (ref 11.5–15.5)
WBC: 8.6 10*3/uL (ref 4.0–10.5)

## 2011-01-14 LAB — BASIC METABOLIC PANEL
BUN: 9 mg/dL (ref 6–23)
CO2: 28 mEq/L (ref 19–32)
Calcium: 8.6 mg/dL (ref 8.4–10.5)
Chloride: 93 mEq/L — ABNORMAL LOW (ref 96–112)
Creatinine, Ser: 0.89 mg/dL (ref 0.50–1.35)
GFR calc Af Amer: >90 mL/min (ref >90)
GFR calc non Af Amer: 82 mL/min — ABNORMAL LOW (ref >90)
Glucose, Bld: 130 mg/dL — ABNORMAL HIGH (ref 70–99)
Potassium: 4.3 mEq/L (ref 3.5–5.1)
Sodium: 129 mEq/L — ABNORMAL LOW (ref 135–145)

## 2011-01-14 LAB — TYPE AND SCREEN
ABO/RH(D): O POS
Antibody Screen: NEGATIVE
Unit division: 0

## 2011-01-14 LAB — GLUCOSE, CAPILLARY
Glucose-Capillary: 128 mg/dL — ABNORMAL HIGH (ref 70–99)
Glucose-Capillary: 138 mg/dL — ABNORMAL HIGH (ref 70–99)

## 2011-01-14 MED ORDER — CELECOXIB 200 MG PO CAPS: 200.0000 mg | ORAL_CAPSULE | Freq: Two times a day (BID) | ORAL | Status: AC

## 2011-01-14 MED ORDER — OXYCODONE HCL 5 MG PO TABS: 5.0000 mg | ORAL_TABLET | ORAL | Status: AC | PRN

## 2011-01-14 MED ORDER — OXYCODONE HCL 20 MG PO TB12: 20.0000 mg | ORAL_TABLET | Freq: Two times a day (BID) | ORAL | Status: AC

## 2011-01-14 MED ORDER — METHOCARBAMOL 500 MG PO TABS: 500.0000 mg | ORAL_TABLET | Freq: Four times a day (QID) | ORAL | Status: AC | PRN

## 2011-01-14 MED ORDER — ENOXAPARIN SODIUM 30 MG/0.3ML ~~LOC~~ SOLN: 40.0000 mg | SUBCUTANEOUS | Status: DC

## 2011-01-14 NOTE — Progress Notes (Signed)
Physical Therapy Treatment Patient Details Name: JOFFRE LUCKS MRN: [id number] DOB: 1932/06/20 Today's Date: 01/14/2011  PT Assessment/Plan  PT - Assessment/Plan Comments on Treatment Session: Pt feeling much better this session. Able to progress with ambulation and stairs this session.  PT Plan: Discharge plan remains appropriate PT Frequency: 7X/week Follow Up Recommendations: Home health PT Equipment Recommended: None recommended by PT PT Goals  Acute Rehab PT Goals PT Goal: Supine/Side to Sit - Progress: Met PT Transfer Goal: Sit to Stand/Stand to Sit - Progress: Progressing toward goal PT Goal: Ambulate - Progress: Progressing toward goal PT Goal: Perform Home Exercise Program - Progress: Progressing toward goal  PT Treatment Precautions/Restrictions  Precautions Precautions: Knee Required Braces or Orthoses: No Restrictions Weight Bearing Restrictions: Yes LLE Weight Bearing: Weight bearing as tolerated Mobility (including Balance) Bed Mobility Bed Mobility: Yes Supine to Sit: 6: Modified independent (Device/Increase time) Transfers Transfers: Yes Sit to Stand: 5: Supervision;From chair/3-in-1 Sit to Stand Details (indicate cue type and reason): Cues for safe hand placement. x3 Stand to Sit: 5: Supervision;To chair/3-in-1 Stand to Sit Details: Cues to slide out LLE. x3 Ambulation/Gait Ambulation/Gait: Yes Ambulation/Gait Assistance: 5: Supervision Ambulation/Gait Assistance Details (indicate cue type and reason): Cues for gait sequence Ambulation Distance (Feet): 180 Feet Assistive device: Rolling walker Gait Pattern: Step-to pattern Stairs: Yes Stairs Assistance: 4: Min assist Stairs Assistance Details (indicate cue type and reason): A for balance. Cues for sequency and technique Stair Management Technique: No rails;Backwards;With walker Number of Stairs: 1  (practiced twice) Wheelchair Mobility Wheelchair Mobility: No    Exercise  Total Joint  Exercises Short Arc Quad: AAROM;Strengthening;Left;Other (comment);Seated (12) Heel Slides: AAROM;Strengthening;Left;Other (comment);Seated (12) Hip ABduction/ADduction: Seated;Other (comment) (12) Straight Leg Raises: AAROM;Strengthening;Left End of Session PT - End of Session Equipment Utilized During Treatment: Gait belt Activity Tolerance: Patient tolerated treatment well Patient left: in chair;with call bell in reach General Behavior During Session: [facility] for tasks performed Cognition: [facility] for tasks performed  Fredrich Birks 01/14/2011, 8:45 AM 01/14/2011 Fredrich Birks PTA [phone] pager [phone] office

## 2011-01-14 NOTE — Progress Notes (Signed)
Physical Therapy Treatment Patient Details Name: LUISFELIPE ENGELSTAD MRN: [id number] DOB: 08/03/1932 Today's Date: 01/14/2011  PT Assessment/Plan  PT - Assessment/Plan Comments on Treatment Session: Pt did well. Anticipate DC home today.  PT Plan: Discharge plan remains appropriate PT Frequency: 7X/week Follow Up Recommendations: Home health PT Equipment Recommended: None recommended by PT PT Goals  Acute Rehab PT Goals PT Goal: Supine/Side to Sit - Progress: Met PT Transfer Goal: Sit to Stand/Stand to Sit - Progress: Met PT Goal: Ambulate - Progress: Met Pt will Go Up / Down Stairs: 1-2 stairs;with min assist;with rolling walker PT Goal: Up/Down Stairs - Progress: Met PT Goal: Perform Home Exercise Program - Progress: Progressing toward goal  PT Treatment Precautions/Restrictions  Precautions Precautions: Knee Required Braces or Orthoses: No Restrictions Weight Bearing Restrictions: Yes LLE Weight Bearing: Weight bearing as tolerated Mobility (including Balance) Bed Mobility Bed Mobility: Yes Supine to Sit: 6: Modified independent (Device/Increase time) Transfers Transfers: Yes Sit to Stand: 6: Modified independent (Device/Increase time) Sit to Stand Details (indicate cue type and reason): Cues for safe hand placement. x3 Stand to Sit: 6: Modified independent (Device/Increase time) Stand to Sit Details: Cues to slide out LLE. x3 Ambulation/Gait Ambulation/Gait: Yes Ambulation/Gait Assistance: 6: Modified independent (Device/Increase time) Ambulation/Gait Assistance Details (indicate cue type and reason): Cues for gait sequence Ambulation Distance (Feet): 130 Feet Assistive device: Rolling walker Gait Pattern: Step-to pattern;Trunk flexed Stairs: Yes Stairs Assistance: Other (comment) (MinGuard A) Stairs Assistance Details (indicate cue type and reason): A for balance. Cues for sequency and technique Stair Management Technique: No rails;Backwards;With walker Number of  Stairs: 1  (practiced twice) Wheelchair Mobility Wheelchair Mobility: No    Exercise  End of Session PT - End of Session Equipment Utilized During Treatment: Gait belt Activity Tolerance: Patient tolerated treatment well Patient left: Other (comment) (in restroom with wife) Nurse Communication: Mobility status for transfers General Behavior During Session: [facility] for tasks performed Cognition: [facility] for tasks performed  Robinette, Adline Potter 01/14/2011, 11:58 AM

## 2011-01-14 NOTE — Progress Notes (Signed)
PATIENT ID:      Kyle Simon  MRN:     [id number] DOB/AGE:    75-03-1932 / 77 y.o.    PROGRESS NOTE Subjective:  negative for Chest Pain  negative for Shortness of Breath  negative for Nausea/Vomiting   negative for Calf Pain  negative for Bowel Movement   Tolerating Diet: yes         Patient reports pain as 0 on 0-10 scale and 2 on 0-10 scale.    Objective: Vital signs in last 24 hours:  Patient Vitals for the past 24 hrs:  BP Temp Temp src Pulse Resp SpO2  01/14/11 0553 138/62 mmHg 98.9 F (37.2 C) - 72  18  95 %  01/13/11 2300 144/65 mmHg 101.3 F (38.5 C) - 78  18  96 %  01/13/11 1500 125/77 mmHg 100.4 F (38 C) Oral 73  16  -  01/13/11 1349 136/72 mmHg 100.6 F (38.1 C) Oral 73  16  -  01/13/11 1240 156/70 mmHg 97.9 F (36.6 C) Oral 76  20  -      Intake/Output from previous day:   11/14 0701 - 11/15 0700 In: 540 [P.O.:240] Out: -    Intake/Output this shift:       Intake/Output      11/14 0701 - 11/15 0700 11/15 0701 - 11/16 0700   P.O. 240    Blood 300    Total Intake 540    Urine     Total Output     Net +540            LABORATORY DATA:  Basename 01/14/11 0535 01/13/11 1539 01/13/11 0612 01/12/11 1716 01/12/11 0715  WBC 8.6 8.4 7.8 7.8 7.6  HGB 8.8* 9.1* 8.1* 9.0* 9.0*  HCT 25.9* 27.1* 24.2* 27.1* 27.1*  PLT 184 170 165 176 170    Basename 01/14/11 0535 01/13/11 0612 01/12/11 0715  NA 129* 125* 129*  K 4.3 4.2 4.0  CL 93* 92* 95*  CO2 28 26 27   BUN 9 8 8   CREATININE 0.89 0.83 0.81  GLUCOSE 130* 124* 123*  CALCIUM 8.6 8.3* 8.2*   Lab Results  Component Value Date   INR 1.02 12/31/2010    Examination:  General appearance: alert and cooperative Extremities: extremities normal, atraumatic, no cyanosis or edema and Homans sign is negative, no sign of DVT  Wound Exam: clean, dry, intact   Drainage:  None: wound tissue dry  Motor Exam EHL and FHL Intact  Sensory Exam Tibial normal  Assessment:    3 Days Post-Op  Procedure(s)  (LRB): TOTAL KNEE ARTHROPLASTY (Left)  ADDITIONAL DIAGNOSIS:  Active Problems:  * No active hospital problems. *   Acute Blood Loss Anemia   Plan: Physical Therapy as ordered Weight Bearing as Tolerated (WBAT)  DVT Prophylaxis:  Lovenox  DISCHARGE PLAN: Home  DISCHARGE NEEDS: HHPT, CPM, Walker and 3-in-1 comode seat         Yanel Dombrosky 01/14/2011, 12:35 PM

## 2011-01-14 NOTE — Discharge Summary (Signed)
PATIENT ID:      Kyle Simon  MRN:     [id number] DOB/AGE:    Feb 18, 1933 / 77 y.o.     DISCHARGE SUMMARY  ADMISSION DATE:    01/11/2011 DISCHARGE DATE:   01/14/2011   ADMISSION DIAGNOSIS: osteoarthritis  DISCHARGE DIAGNOSIS:  osteoarthritis    ADDITIONAL DIAGNOSIS: Active Problems:  * No active hospital problems. *   Past Medical History  Diagnosis Date  . Hyperlipidemia   . Snoring     no sleep apnea  . Polio     as a child w/o complications  . Pneumonia 1980's    past hx., has had exposure to agent orange   . Diabetes mellitus     adult onset  . Hypothyroidism   . Arthritis     BOTH KNEES    PROCEDURE: Procedure(s): TOTAL KNEE ARTHROPLASTY on 01/11/2011  CONSULTS:     HISTORY:  See H&P in chart  HOSPITAL COURSE:  Kyle Simon is a 77 y.o. admitted on 01/11/2011 and found to have a diagnosis of osteoarthritis.  After appropriate laboratory studies were obtained  they were taken to the operating room on 01/11/2011 and underwent Procedure(s): TOTAL KNEE ARTHROPLASTY.   They were given perioperative antibiotics:  Anti-infectives     Start     Dose/Rate Route Frequency Ordered Stop   01/11/11 1400   ceFAZolin (ANCEF) IVPB 2 g/50 mL premix        2 g 100 mL/hr over 30 Minutes Intravenous Every 6 hours 01/11/11 1350 01/12/11 0131   01/10/11 1330   ceFAZolin (ANCEF) IVPB 2 g/50 mL premix  Status:  Discontinued        2 g 100 mL/hr over 30 Minutes Intravenous 60 min pre-op 01/10/11 1316 01/11/11 1248        .   The remainder of the hospital course was dedicated to ambulation and strengthening.   The patient was discharged on 3 Days Post-Op in stable condition.   DIAGNOSTIC STUDIES: Recent vital signs: Temp:  [98.9 F (37.2 C)-101.3 F (38.5 C)] 98.9 F (37.2 C) (11/15 0553) Pulse Rate:  [72-78] 72  (11/15 0553) Resp:  [16-18] 18  (11/15 0553) BP: (125-144)/(62-77) 138/62 mmHg (11/15 0553) SpO2:  [95 %-96 %] 95 % (11/15 0553)    Recent laboratory  studies:  [facility] 01/14/11 0535 02/03/11 1539 2011-02-03 0612 01/12/11 1716 01/12/11 0715  WBC 8.6 8.4 7.8 7.8 7.6  HGB 8.8* 9.1* 8.1* 9.0* 9.0*  HCT 25.9* 27.1* 24.2* 27.1* 27.1*  PLT 184 170 165 176 170    Basename 01/14/11 0535 2011/02/03 0612 01/12/11 0715  NA 129* 125* 129*  K 4.3 4.2 4.0  CL 93* 92* 95*  CO2 28 26 27   BUN 9 8 8   CREATININE 0.89 0.83 0.81  GLUCOSE 130* 124* 123*  CALCIUM 8.6 8.3* 8.2*   Lab Results  Component Value Date   INR 1.02 12/31/2010     Recent Radiographic Studies :  Dg Chest 2 View  12/31/2010  *RADIOLOGY REPORT*  Clinical Data: Preop left knee replacement  CHEST - 2 VIEW  Comparison: Chest radiograph 04/23/2009  Findings: Normal mediastinum and cardiac silhouette.  Costophrenic angles are clear.  No effusion, infiltrate, or pneumothorax.  IMPRESSION: No acute cardiopulmonary process.  Original Report Authenticated By: Kyle Simon, M.D.    DISCHARGE INSTRUCTIONS:   DISCHARGE MEDICATIONS:   Current Discharge Medication List    START taking these medications   Details  enoxaparin (LOVENOX) 30 MG/0.3ML SOLN Inject  0.4 mLs (40 mg total) into the skin daily. Qty: 9 Syringe, Refills: 0    methocarbamol (ROBAXIN) 500 MG tablet Take 1 tablet (500 mg total) by mouth every 6 (six) hours as needed. Qty: 60 tablet, Refills: 0    oxyCODONE (OXY IR/ROXICODONE) 5 MG immediate release tablet Take 1-2 tablets (5-10 mg total) by mouth every 4 (four) hours as needed. Qty: 60 tablet, Refills: 0    oxyCODONE (OXYCONTIN) 20 MG 12 hr tablet Take 1 tablet (20 mg total) by mouth every 12 (twelve) hours. Qty: 30 tablet, Refills: 0      CONTINUE these medications which have CHANGED   Details  celecoxib (CELEBREX) 200 MG capsule Take 1 capsule (200 mg total) by mouth 2 (two) times daily. Qty: 30 capsule, Refills: 1      CONTINUE these medications which have NOT CHANGED   Details  acetaminophen (TYLENOL) 500 MG tablet Take 1,000 mg by mouth at bedtime.       Glucosamine HCl 1000 MG TABS Take by mouth daily.      levothyroxine (SYNTHROID, LEVOTHROID) 150 MCG tablet Take 150 mcg by mouth daily.      metFORMIN (GLUCOPHAGE) 500 MG tablet Take 500 mg by mouth at bedtime.      Multiple Vitamin (MULTIVITAMIN) tablet Take 1 tablet by mouth daily.      ranitidine (ZANTAC) 150 MG tablet Take 150 mg by mouth 2 (two) times daily.      rosuvastatin (CRESTOR) 5 MG tablet Take 5 mg by mouth at bedtime.      Tamsulosin HCl (FLOMAX) 0.4 MG CAPS Take by mouth 2 (two) times daily.       STOP taking these medications     aspirin 81 MG tablet      fish oil-omega-3 fatty acids 1000 MG capsule         FOLLOW UP VISIT:   Follow-up Information    Follow up with Raymon Mutton, MD. Call on 01/26/2011.   Contact information:   [address] [place] [address] [phone]          DISPOSITION:   discharged in improved condition    Kyle Simon 01/14/2011, 12:43 PM

## 2011-03-02 DIAGNOSIS — Z96659 Presence of unspecified artificial knee joint: Secondary | ICD-10-CM | POA: Diagnosis not present

## 2011-03-02 DIAGNOSIS — M25569 Pain in unspecified knee: Secondary | ICD-10-CM | POA: Diagnosis not present

## 2011-03-03 DIAGNOSIS — M25569 Pain in unspecified knee: Secondary | ICD-10-CM | POA: Diagnosis not present

## 2011-03-03 DIAGNOSIS — Z96659 Presence of unspecified artificial knee joint: Secondary | ICD-10-CM | POA: Diagnosis not present

## 2011-03-05 DIAGNOSIS — M25569 Pain in unspecified knee: Secondary | ICD-10-CM | POA: Diagnosis not present

## 2011-03-05 DIAGNOSIS — Z96659 Presence of unspecified artificial knee joint: Secondary | ICD-10-CM | POA: Diagnosis not present

## 2011-03-10 DIAGNOSIS — M25569 Pain in unspecified knee: Secondary | ICD-10-CM | POA: Diagnosis not present

## 2011-03-10 DIAGNOSIS — Z96659 Presence of unspecified artificial knee joint: Secondary | ICD-10-CM | POA: Diagnosis not present

## 2011-03-12 DIAGNOSIS — Z96659 Presence of unspecified artificial knee joint: Secondary | ICD-10-CM | POA: Diagnosis not present

## 2011-03-12 DIAGNOSIS — M25569 Pain in unspecified knee: Secondary | ICD-10-CM | POA: Diagnosis not present

## 2011-03-15 DIAGNOSIS — M25569 Pain in unspecified knee: Secondary | ICD-10-CM | POA: Diagnosis not present

## 2011-03-15 DIAGNOSIS — Z96659 Presence of unspecified artificial knee joint: Secondary | ICD-10-CM | POA: Diagnosis not present

## 2011-03-17 ENCOUNTER — Telehealth: Payer: Self-pay | Admitting: Internal Medicine

## 2011-03-17 NOTE — Telephone Encounter (Signed)
Patient calling, asking prior to him making an office visit with Dr. Alwyn Ren to discuss his diabetes, and potassium, should he come in for lab work prior.  Please advise if you would like patient to come in for labs.

## 2011-03-18 DIAGNOSIS — Z96659 Presence of unspecified artificial knee joint: Secondary | ICD-10-CM | POA: Diagnosis not present

## 2011-03-18 DIAGNOSIS — M25569 Pain in unspecified knee: Secondary | ICD-10-CM | POA: Diagnosis not present

## 2011-03-22 ENCOUNTER — Other Ambulatory Visit: Payer: Self-pay | Admitting: Internal Medicine

## 2011-03-22 DIAGNOSIS — D649 Anemia, unspecified: Secondary | ICD-10-CM

## 2011-03-22 DIAGNOSIS — E119 Type 2 diabetes mellitus without complications: Secondary | ICD-10-CM

## 2011-03-22 DIAGNOSIS — T887XXA Unspecified adverse effect of drug or medicament, initial encounter: Secondary | ICD-10-CM

## 2011-03-22 NOTE — Telephone Encounter (Signed)
Please  schedule fasting Labs : BMET,Lipids, AST,ALT, CBC & dif, TSH, A1c & urine microalbumin with OV 2-3 days later. PLEASE BRING THESE INSTRUCTIONS TO FOLLOW UP  LAB APPOINTMENT.This will guarantee correct labs are drawn, eliminating need for repeat blood sampling ( needle sticks ! ). Diagnoses /Codes: 250.00, 995.20, 285.9

## 2011-03-23 ENCOUNTER — Other Ambulatory Visit (INDEPENDENT_AMBULATORY_CARE_PROVIDER_SITE_OTHER): Payer: Medicare Other

## 2011-03-23 DIAGNOSIS — D649 Anemia, unspecified: Secondary | ICD-10-CM

## 2011-03-23 DIAGNOSIS — M25569 Pain in unspecified knee: Secondary | ICD-10-CM | POA: Diagnosis not present

## 2011-03-23 DIAGNOSIS — E119 Type 2 diabetes mellitus without complications: Secondary | ICD-10-CM

## 2011-03-23 DIAGNOSIS — Z96659 Presence of unspecified artificial knee joint: Secondary | ICD-10-CM | POA: Diagnosis not present

## 2011-03-23 DIAGNOSIS — T887XXA Unspecified adverse effect of drug or medicament, initial encounter: Secondary | ICD-10-CM

## 2011-03-23 LAB — CBC WITH DIFFERENTIAL/PLATELET
Basophils Absolute: 0 10*3/uL (ref 0.0–0.1)
Basophils Relative: 0.4 % (ref 0.0–3.0)
Eosinophils Absolute: 0.2 10*3/uL (ref 0.0–0.7)
Eosinophils Relative: 4.7 % (ref 0.0–5.0)
HCT: 35.1 % — ABNORMAL LOW (ref 39.0–52.0)
Hemoglobin: 11.9 g/dL — ABNORMAL LOW (ref 13.0–17.0)
Lymphocytes Relative: 31.2 % (ref 12.0–46.0)
Lymphs Abs: 1.5 10*3/uL (ref 0.7–4.0)
MCHC: 34 g/dL (ref 30.0–36.0)
MCV: 86.7 fl (ref 78.0–100.0)
Monocytes Absolute: 0.5 10*3/uL (ref 0.1–1.0)
Monocytes Relative: 11.3 % (ref 3.0–12.0)
Neutro Abs: 2.5 10*3/uL (ref 1.4–7.7)
Neutrophils Relative %: 52.4 % (ref 43.0–77.0)
Platelets: 247 10*3/uL (ref 150.0–400.0)
RBC: 4.05 Mil/uL — ABNORMAL LOW (ref 4.22–5.81)
RDW: 14.1 % (ref 11.5–14.6)
WBC: 4.7 10*3/uL (ref 4.5–10.5)

## 2011-03-23 LAB — LIPID PANEL
Cholesterol: 121 mg/dL (ref 0–200)
HDL: 40.7 mg/dL (ref >39.00)
LDL Cholesterol: 53 mg/dL (ref 0–99)
Total CHOL/HDL Ratio: 3
Triglycerides: 135 mg/dL (ref 0.0–149.0)
VLDL: 27 mg/dL (ref 0.0–40.0)

## 2011-03-23 LAB — BASIC METABOLIC PANEL
BUN: 13 mg/dL (ref 6–23)
CO2: 26 mEq/L (ref 19–32)
Calcium: 9.3 mg/dL (ref 8.4–10.5)
Chloride: 103 mEq/L (ref 96–112)
Creatinine, Ser: 0.9 mg/dL (ref 0.4–1.5)
GFR: 85 mL/min (ref >60.00)
Glucose, Bld: 103 mg/dL — ABNORMAL HIGH (ref 70–99)
Potassium: 5 mEq/L (ref 3.5–5.1)
Sodium: 136 mEq/L (ref 135–145)

## 2011-03-23 LAB — ALT: ALT: 18 U/L (ref 0–53)

## 2011-03-23 LAB — MICROALBUMIN / CREATININE URINE RATIO
Creatinine,U: 117.6 mg/dL
Microalb Creat Ratio: 0.3 mg/g (ref 0.0–30.0)
Microalb, Ur: 0.4 mg/dL (ref 0.0–1.9)

## 2011-03-23 LAB — TSH: TSH: 2.88 u[IU]/mL (ref 0.35–5.50)

## 2011-03-23 LAB — AST: AST: 22 U/L (ref 0–37)

## 2011-03-23 LAB — HEMOGLOBIN A1C: Hgb A1c MFr Bld: 6.3 % (ref 4.6–6.5)

## 2011-03-25 DIAGNOSIS — Z96659 Presence of unspecified artificial knee joint: Secondary | ICD-10-CM | POA: Diagnosis not present

## 2011-03-25 DIAGNOSIS — M25569 Pain in unspecified knee: Secondary | ICD-10-CM | POA: Diagnosis not present

## 2011-03-25 DIAGNOSIS — M1710 Unilateral primary osteoarthritis, unspecified knee: Secondary | ICD-10-CM | POA: Diagnosis not present

## 2011-03-26 ENCOUNTER — Ambulatory Visit (INDEPENDENT_AMBULATORY_CARE_PROVIDER_SITE_OTHER): Payer: Medicare Other | Admitting: Internal Medicine

## 2011-03-26 ENCOUNTER — Encounter: Payer: Self-pay | Admitting: Internal Medicine

## 2011-03-26 DIAGNOSIS — E039 Hypothyroidism, unspecified: Secondary | ICD-10-CM | POA: Diagnosis not present

## 2011-03-26 DIAGNOSIS — E785 Hyperlipidemia, unspecified: Secondary | ICD-10-CM

## 2011-03-26 DIAGNOSIS — E119 Type 2 diabetes mellitus without complications: Secondary | ICD-10-CM

## 2011-03-26 DIAGNOSIS — K219 Gastro-esophageal reflux disease without esophagitis: Secondary | ICD-10-CM | POA: Diagnosis not present

## 2011-03-26 MED ORDER — LOSARTAN POTASSIUM 50 MG PO TABS: ORAL_TABLET | ORAL | Status: DC

## 2011-03-26 NOTE — Assessment & Plan Note (Signed)
Lipids are at goal on low-dose Crestor. No change indicated

## 2011-03-26 NOTE — Progress Notes (Signed)
Subjective:    Patient ID: Kyle Simon, male    DOB: 01/17/1933, 78 y.o.   MRN: [id number]  HPI Diabetes status assessment: Fasting or morning glucose range:  100-112  Highest glucose 2 hours after any meal:  < 140 Hypoglycemia :  no .                                                     Excess thirst ; hunger; urination: no.                                  Lightheadedness with standing: no. Chest pain ; Palpitations ; Pain in  calves with walking:  no . The chest pain he experienced in 2012 resolve with treatment of reflux. He has no reflux symptoms at present.                                                                                                                                  Non healing skin  ulcers or sores,especially over the feet:  no. Numbness or tingling or burning in feet : no .                                                                                                                                              Significant change in  Weight : His weight is down 15 pounds since his total knee replacement 01/11/2011 Vision changes : no  .                                                                    Exercise : no. Nutrition/diet:  Modified [facility]. Medication compliance : Losartan was stopped perioperatively as blood pressure were extremely low before his total knee replacement 11/12. This ARB have been initiated as prophylaxis for renal disease with his diabetes.  Off this agent, blood pressures range from 138/75- 140/80. Medication adverse  Effects:  no Eye exam : due ,appt next month; no retinopathy Foot care : 06/2010; no active process present.  A1c/ urine microalbumin monitor: A1c 6.3% one 03/23/11. Urine microalbumin is low normal at 0.4.      Review of Systems   He denies dysphagia, significant heartburn, abdominal pain, melena, or rectal bleeding. He did have constipation postoperatively with the pain medications. Now stools are  loose.  Hematocrit is 35.1; it was 25.9 on 01/14/11 3 days postop. He received one unit of blood (autologous)     Objective:   Physical Exam He appears healthy and well-nourished; he is in no acute distress.Appears younger than stated age   No carotid bruits are present.  Heart rhythm and rate are normal with no rubs  or gallops.Grade 1/6 systolic murmur   Chest is clear with no increased work of breathing  There is no evidence of aortic aneurysm or renal artery bruits  He has no clubbing or edema.   Pedal pulses are intact   Sensation to light touch normal over feet.   No ischemic skin changes are present . Chronic toenail changes       Assessment & Plan:   Anemia relates to his total knee replacement 11/12. It is improving. I would recommend taking a multiple vitamin with iron daily until the hematocrit returns to normal.

## 2011-03-26 NOTE — Patient Instructions (Addendum)
The triggers for dyspepsia or "heart burn"  include stress; the "aspirin family" ; alcohol; peppermint; and caffeine (coffee, tea, cola, and chocolate). The aspirin family would include aspirin and the nonsteroidal agents such as ibuprofen &  Naproxen. Tylenol would not cause reflux. If having dyspepsia ; food & drink should be avoided for @ least 2 hours before going to bed. Blood Pressure Goal  Ideally is an AVERAGE < 135/85. This AVERAGE should be calculated from @ least 5-7 BP readings taken @ different times of day on different days of week. You should not respond to isolated BP readings , but rather the AVERAGE for that week PLEASE BRING THESE INSTRUCTIONS TO FOLLOW UP  LAB APPOINTMENT in 3-4 months for A1c .This will guarantee correct labs are drawn, eliminating need for repeat blood sampling ( needle sticks ! ). Diagnoses /Codes:250.00. The most common cause of elevated triglycerides is the ingestion of sugar from high fructose corn syrup sources added to processed foods & drinks.  Eat a low-fat diet with lots of fruits and vegetables, up to 7-9 servings per day. Consume less than 40 (preferably ZERO) grams of sugar per day from foods & drinks with High Fructose Corn Syrup (HFCS) sugar as #1,2,3 or # 4 on label.Whole Foods, [employer] & [employer] do not carry products with HFCS. Follow a  low carb nutrition program such as [place] or The [employer] Sugar [employer]  to prevent Diabetes progression . White carbohydrates (potatoes, rice, bread, and pasta) have a high spike of sugar and a high load of sugar. For example a  baked potato has a cup of sugar and a  french fry  2 teaspoons of sugar. Yams, wild  rice, whole grained bread &  wheat pasta have been much lower spike and load of  sugar. Portions should be the size of a deck of cards or your palm.

## 2011-03-26 NOTE — Assessment & Plan Note (Signed)
TSH is therapeutic; no change indicated. 

## 2011-03-26 NOTE — Assessment & Plan Note (Signed)
A1c is 6.3 on low-dose metformin. This is improved from a value of 6.6 on 01/11/11. I would recommend repeating the A1c in 4 months, off metformin. He should continue his low-carb diet and weight maintenance. Although he does not have hypertension of a significant degree; low-dose ARB may be renal protective. I would ask him to consider taking the losartan 50 mg one half pill daily.

## 2011-04-01 ENCOUNTER — Other Ambulatory Visit: Payer: Self-pay | Admitting: Internal Medicine

## 2011-04-02 ENCOUNTER — Other Ambulatory Visit: Payer: Self-pay

## 2011-04-02 MED ORDER — LEVOTHYROXINE SODIUM 150 MCG PO TABS: 150.0000 ug | ORAL_TABLET | Freq: Every day | ORAL | Status: DC

## 2011-04-13 DIAGNOSIS — M1710 Unilateral primary osteoarthritis, unspecified knee: Secondary | ICD-10-CM | POA: Diagnosis not present

## 2011-04-13 DIAGNOSIS — M76899 Other specified enthesopathies of unspecified lower limb, excluding foot: Secondary | ICD-10-CM | POA: Diagnosis not present

## 2011-04-13 DIAGNOSIS — M545 Low back pain, unspecified: Secondary | ICD-10-CM | POA: Diagnosis not present

## 2011-04-20 ENCOUNTER — Ambulatory Visit (HOSPITAL_BASED_OUTPATIENT_CLINIC_OR_DEPARTMENT_OTHER)
Admission: RE | Admit: 2011-04-20 | Discharge: 2011-04-20 | Disposition: A | Discharge status: Home / Self Care | Payer: Medicare Other | Source: Ambulatory Visit | Attending: Rheumatology | Admitting: Rheumatology

## 2011-04-20 ENCOUNTER — Other Ambulatory Visit (HOSPITAL_BASED_OUTPATIENT_CLINIC_OR_DEPARTMENT_OTHER): Payer: Medicare Other

## 2011-04-20 ENCOUNTER — Other Ambulatory Visit (HOSPITAL_BASED_OUTPATIENT_CLINIC_OR_DEPARTMENT_OTHER): Payer: Self-pay | Admitting: Rheumatology

## 2011-04-20 DIAGNOSIS — M79609 Pain in unspecified limb: Secondary | ICD-10-CM | POA: Insufficient documentation

## 2011-04-20 DIAGNOSIS — IMO0002 Reserved for concepts with insufficient information to code with codable children: Secondary | ICD-10-CM | POA: Diagnosis not present

## 2011-04-20 DIAGNOSIS — M7130 Other bursal cyst, unspecified site: Secondary | ICD-10-CM

## 2011-04-20 DIAGNOSIS — M5137 Other intervertebral disc degeneration, lumbosacral region: Secondary | ICD-10-CM | POA: Diagnosis not present

## 2011-04-20 DIAGNOSIS — M51379 Other intervertebral disc degeneration, lumbosacral region without mention of lumbar back pain or lower extremity pain: Secondary | ICD-10-CM | POA: Insufficient documentation

## 2011-04-20 DIAGNOSIS — M545 Low back pain, unspecified: Secondary | ICD-10-CM

## 2011-04-20 DIAGNOSIS — M48061 Spinal stenosis, lumbar region without neurogenic claudication: Secondary | ICD-10-CM | POA: Diagnosis not present

## 2011-04-21 DIAGNOSIS — E11319 Type 2 diabetes mellitus with unspecified diabetic retinopathy without macular edema: Secondary | ICD-10-CM | POA: Diagnosis not present

## 2011-04-21 DIAGNOSIS — H26499 Other secondary cataract, unspecified eye: Secondary | ICD-10-CM | POA: Diagnosis not present

## 2011-04-27 ENCOUNTER — Ambulatory Visit (INDEPENDENT_AMBULATORY_CARE_PROVIDER_SITE_OTHER): Payer: Medicare Other | Admitting: Internal Medicine

## 2011-04-27 ENCOUNTER — Encounter: Payer: Self-pay | Admitting: Internal Medicine

## 2011-04-27 VITALS — BP 148/80 | HR 51 | Temp 98.4°F | Resp 12 | Ht 70.5 in | Wt 190.6 lb

## 2011-04-27 DIAGNOSIS — E119 Type 2 diabetes mellitus without complications: Secondary | ICD-10-CM

## 2011-04-27 DIAGNOSIS — D649 Anemia, unspecified: Secondary | ICD-10-CM

## 2011-04-27 DIAGNOSIS — Z1211 Encounter for screening for malignant neoplasm of colon: Secondary | ICD-10-CM

## 2011-04-27 DIAGNOSIS — E039 Hypothyroidism, unspecified: Secondary | ICD-10-CM | POA: Diagnosis not present

## 2011-04-27 DIAGNOSIS — E785 Hyperlipidemia, unspecified: Secondary | ICD-10-CM

## 2011-04-27 DIAGNOSIS — Z0000 Encounter for general adult medical examination without abnormal findings: Secondary | ICD-10-CM

## 2011-04-27 DIAGNOSIS — K219 Gastro-esophageal reflux disease without esophagitis: Secondary | ICD-10-CM

## 2011-04-27 LAB — POCT URINALYSIS DIPSTICK
Bilirubin, UA: NEGATIVE
Blood, UA: NEGATIVE
Glucose, UA: NEGATIVE
Ketones, UA: NEGATIVE
Leukocytes, UA: NEGATIVE
Nitrite, UA: NEGATIVE
Protein, UA: NEGATIVE
Spec Grav, UA: 1.01
Urobilinogen, UA: 0.2
pH, UA: 6

## 2011-04-27 LAB — CBC WITH DIFFERENTIAL/PLATELET
Basophils Absolute: 0 10*3/uL (ref 0.0–0.1)
Basophils Relative: 0.3 % (ref 0.0–3.0)
Eosinophils Absolute: 0.1 10*3/uL (ref 0.0–0.7)
Eosinophils Relative: 0.8 % (ref 0.0–5.0)
HCT: 34.9 % — ABNORMAL LOW (ref 39.0–52.0)
Hemoglobin: 12.3 g/dL — ABNORMAL LOW (ref 13.0–17.0)
Lymphocytes Relative: 23 % (ref 12.0–46.0)
Lymphs Abs: 1.7 10*3/uL (ref 0.7–4.0)
MCHC: 35.1 g/dL (ref 30.0–36.0)
MCV: 94.5 fl (ref 78.0–100.0)
Monocytes Absolute: 0.6 10*3/uL (ref 0.1–1.0)
Monocytes Relative: 8.2 % (ref 3.0–12.0)
Neutro Abs: 4.9 10*3/uL (ref 1.4–7.7)
Neutrophils Relative %: 67.7 % (ref 43.0–77.0)
Platelets: 269 10*3/uL (ref 150.0–400.0)
RBC: 3.69 Mil/uL — ABNORMAL LOW (ref 4.22–5.81)
RDW: 14.9 % — ABNORMAL HIGH (ref 11.5–14.6)
WBC: 7.2 10*3/uL (ref 4.5–10.5)

## 2011-04-27 NOTE — Patient Instructions (Addendum)
Please complete  stool cards. As per the Standard of Care , screening Colonoscopy recommended @ 50 & every 5-10 years thereafter . More frequent monitor would be dictated by family history or findings @ Colonoscopy. Your last colonoscopy apparently was 1998.  See Appendum concerning the neurosurgical and rheumatologic diagnoses. 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

## 2011-04-27 NOTE — Assessment & Plan Note (Signed)
Fasting blood sugars are at goal off metformin. A1c was 6.3% on 03/23/11 on metformin.

## 2011-04-27 NOTE — Progress Notes (Signed)
Subjective:    Patient ID: Kyle Simon, male    DOB: 26-Dec-1932, 78 y.o.   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 and reducing his ranitidine to 1 daily.Issuesreviewed:  HYPERTENSION: Disease Monitoring: Blood pressure range-135-140/75-82 Chest pain, palpitations- no   Dyspnea- no Medications: Compliance- yes Lightheadedness,Syncope-no Edema- no  DIABETES: Disease Monitoring: Blood Sugar ranges-FBS 86-110 @ home Polyuria/phagia/dipsia- no       Visual problems- no;see Ophthalmology evaluation 04/21/2011 Medications: Compliance- OFF Metformin X 1 month Hypoglycemic symptoms- no Diet:modified [place]  HYPERLIPIDEMIA: Disease Monitoring: See symptoms for Hypertension Medications: Compliance-yes Abd pain, bowel changes-no   Muscle aches- no          Review of Systems He denies significant dyspepsia, dysphagia, abdominal pain, unexplained weight loss, rectal bleeding, or melena.     Objective:   Physical Exam Gen.: Healthy and well-nourished in appearance. Alert, appropriate and cooperative throughout exam. Head: Normocephalic without obvious abnormalities; pattern alopecia  Eyes: No corneal or conjunctival inflammation noted. Pupils equal round reactive to light and accommodation. Fundal exam is benign without hemorrhages, exudate, papilledema. Extraocular motion intact. Vision grossly normal.FOV normal Ears: External  ear exam reveals no significant lesions or deformities. Canals clear .TMs normal. Hearing is grossly normal bilaterally. Nose: External nasal exam reveals no deformity or inflammation. Nasal mucosa are pink and moist. No lesions or exudates noted.  Mouth: Oral mucosa and oropharynx reveal no lesions or exudates. Teeth in good repair.Tatoo(punctate) of posterior soft palate Neck: No deformities, masses, or tenderness noted. Range of motion &Thyroid normal Lungs: Normal respiratory effort; chest  expands symmetrically. Lungs are clear to auscultation without rales, wheezes, or increased work of breathing. Heart: Normal rate and rhythm. Normal S1 and S2. No gallop, click, or rub. Grade 1/2-1 over 6 systolic murmur . Abdomen: Bowel sounds normal; abdomen soft and nontender. No masses, organomegaly or hernias noted. Genitalia:normal. DRE by Dr Isabel Caprice   .                                                                                   Musculoskeletal/extremities: No deformity or scoliosis noted of  the thoracic or lumbar spine but R > L paraspinus muscle mass. No clubbing, cyanosis, edema, or deformity noted. Range of motion decreased L knee. .Tone & strength  normal.Joints normal. Mild toenail changes. Vascular: Carotid, radial artery, dorsalis pedis and  posterior tibial pulses are full and equal. No bruits present. Neurologic: Alert and oriented x3. Deep tendon reflexes symmetrical and normal.          Skin: Intact without suspicious lesions or rashes. Op scar L knee; trauma scar forehead Lymph: No cervical, axillary, or inguinal lymphadenopathy present. Psych: Mood and affect are normal. Normally interactive  Assessment & Plan:

## 2011-04-27 NOTE — Assessment & Plan Note (Addendum)
He has no active reflux symptoms. Hemoglobin 11.9/hematocrit 35.1 on 03/23/11. This was following the left total knee replacement. CBC will be rechecked.  Repeat H/H 04/27/11  stable to improved: 12.3/34.9

## 2011-04-27 NOTE — Assessment & Plan Note (Signed)
Lipids were excellent 03/23/11. Specifically LDL was 53 and HDL 40.7. Liver function tests were normal with an ALT of 18 and AST of 22.

## 2011-04-27 NOTE — Assessment & Plan Note (Signed)
TSH therapeutic at 2.88 on 03/23/11

## 2011-04-28 DIAGNOSIS — M545 Low back pain, unspecified: Secondary | ICD-10-CM | POA: Diagnosis not present

## 2011-04-30 DIAGNOSIS — M6740 Ganglion, unspecified site: Secondary | ICD-10-CM | POA: Diagnosis not present

## 2011-04-30 DIAGNOSIS — M545 Low back pain, unspecified: Secondary | ICD-10-CM | POA: Diagnosis not present

## 2011-04-30 DIAGNOSIS — IMO0002 Reserved for concepts with insufficient information to code with codable children: Secondary | ICD-10-CM | POA: Diagnosis not present

## 2011-05-03 ENCOUNTER — Telehealth: Payer: Self-pay | Admitting: Internal Medicine

## 2011-05-03 MED ORDER — LEVOTHYROXINE SODIUM 150 MCG PO TABS: 150.0000 ug | ORAL_TABLET | Freq: Every day | ORAL | Status: DC

## 2011-05-03 NOTE — Telephone Encounter (Signed)
Prescription sent to pharmacy.

## 2011-05-03 NOTE — Telephone Encounter (Signed)
Refill: Levothyroxine sodium . Take 1 tablet by mouth every day. Qty90.

## 2011-05-18 ENCOUNTER — Other Ambulatory Visit: Payer: Self-pay | Admitting: Internal Medicine

## 2011-05-26 DIAGNOSIS — M545 Low back pain, unspecified: Secondary | ICD-10-CM | POA: Diagnosis not present

## 2011-06-30 DIAGNOSIS — M25549 Pain in joints of unspecified hand: Secondary | ICD-10-CM | POA: Diagnosis not present

## 2011-06-30 DIAGNOSIS — M2550 Pain in unspecified joint: Secondary | ICD-10-CM | POA: Diagnosis not present

## 2011-06-30 DIAGNOSIS — R5383 Other fatigue: Secondary | ICD-10-CM | POA: Diagnosis not present

## 2011-06-30 DIAGNOSIS — Z79899 Other long term (current) drug therapy: Secondary | ICD-10-CM | POA: Diagnosis not present

## 2011-06-30 DIAGNOSIS — M545 Low back pain, unspecified: Secondary | ICD-10-CM | POA: Diagnosis not present

## 2011-06-30 DIAGNOSIS — IMO0001 Reserved for inherently not codable concepts without codable children: Secondary | ICD-10-CM | POA: Diagnosis not present

## 2011-06-30 DIAGNOSIS — R5381 Other malaise: Secondary | ICD-10-CM | POA: Diagnosis not present

## 2011-06-30 DIAGNOSIS — M1710 Unilateral primary osteoarthritis, unspecified knee: Secondary | ICD-10-CM | POA: Diagnosis not present

## 2011-07-01 DIAGNOSIS — M25569 Pain in unspecified knee: Secondary | ICD-10-CM | POA: Diagnosis not present

## 2011-07-05 DIAGNOSIS — M545 Low back pain, unspecified: Secondary | ICD-10-CM | POA: Diagnosis not present

## 2011-07-20 ENCOUNTER — Telehealth: Payer: Self-pay | Admitting: Internal Medicine

## 2011-07-20 DIAGNOSIS — E119 Type 2 diabetes mellitus without complications: Secondary | ICD-10-CM

## 2011-07-20 NOTE — Telephone Encounter (Signed)
He was to mail the copy of the physical with a copy of the FAA  letter to [facility]. I'll send him another copy of the physical exam 04/24/11 for him to mail to [facility]. FAA may or may not want a followup A1c off metformin. After review of data in that note  Certificate will have to be issued by FAA from [facility].

## 2011-07-20 NOTE — Telephone Encounter (Signed)
Spoke with patient, patient states effective last month the MD can issue the certificate , good for 2 years. If someone is considered Pre-Diabetic the MD can issue certificate where as in the past the FAA had to issue. If labeled as Diabetic then the FAA will have issue

## 2011-07-20 NOTE — Telephone Encounter (Signed)
Dr.Hopper please advise 

## 2011-07-20 NOTE — Telephone Encounter (Signed)
Pt had FAA physical in Feb. Due to him having a waiver condition, Dr. Alwyn Ren had to send in recommendation. Pt states the FAA have not received the proper paperwork. Pt would like to know what is going on. He states the FAA no longer requires waiver for pre-diabetic condition. Pt states he needs his needs his FAA clearance. Please advise.

## 2011-07-21 NOTE — Telephone Encounter (Signed)
A1c was 6.3% in January. This still is in the diabetic range. This was on metformin. The A1c was to be repeated this month after being off metformin for 4 months.Code : 250.00

## 2011-07-21 NOTE — Telephone Encounter (Signed)
Left message on voicemail with Dr.Hopper's response, patient to call and schedule appointment for A1c 250.00. (Future Order placed)

## 2011-07-22 ENCOUNTER — Other Ambulatory Visit (INDEPENDENT_AMBULATORY_CARE_PROVIDER_SITE_OTHER): Payer: Medicare Other

## 2011-07-22 DIAGNOSIS — IMO0001 Reserved for inherently not codable concepts without codable children: Secondary | ICD-10-CM

## 2011-07-22 DIAGNOSIS — N401 Enlarged prostate with lower urinary tract symptoms: Secondary | ICD-10-CM | POA: Diagnosis not present

## 2011-07-22 DIAGNOSIS — E119 Type 2 diabetes mellitus without complications: Secondary | ICD-10-CM

## 2011-07-22 DIAGNOSIS — N138 Other obstructive and reflux uropathy: Secondary | ICD-10-CM | POA: Diagnosis not present

## 2011-07-22 LAB — HEMOGLOBIN A1C: Hgb A1c MFr Bld: 6.5 % (ref 4.6–6.5)

## 2011-07-22 NOTE — Telephone Encounter (Signed)
Spoke with patient, patient aware he needs A1c first before Dr.Hopper can consider re-certifying  Him. Patient stated he would like to go to [facility]

## 2011-07-23 DIAGNOSIS — C44519 Basal cell carcinoma of skin of other part of trunk: Secondary | ICD-10-CM | POA: Diagnosis not present

## 2011-07-23 DIAGNOSIS — L570 Actinic keratosis: Secondary | ICD-10-CM | POA: Diagnosis not present

## 2011-07-23 DIAGNOSIS — L578 Other skin changes due to chronic exposure to nonionizing radiation: Secondary | ICD-10-CM | POA: Diagnosis not present

## 2011-07-26 ENCOUNTER — Encounter: Payer: Self-pay | Admitting: Internal Medicine

## 2011-07-26 DIAGNOSIS — R252 Cramp and spasm: Secondary | ICD-10-CM | POA: Insufficient documentation

## 2011-07-26 DIAGNOSIS — M7138 Other bursal cyst, other site: Secondary | ICD-10-CM | POA: Insufficient documentation

## 2011-07-26 NOTE — Progress Notes (Signed)
  Subjective:    Patient ID: Kyle Simon, male    DOB: 15-Aug-1932, 78 y.o.   MRN: [id number]  HPI    Review of Systems     Objective:   Physical Exam        Assessment & Plan:  With  Nutritional interventions alone & off all diabetic medications; his A1c exhibits excellent control at 6.5%. He's had no adverse complications related to diabetes and no hypoglycemia. I have reviewed the FAA guidelines; his diabetes would not preclude issuance of a 3rd Tourist information centre manager . 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 Additionally he was seen by a Rheumatologist for spasms of the hand. He questions whether this might be related to his statin. Final assessment of this problem has not been received. Statements from both specialist should accompany the followup of his diabetes.

## 2011-07-30 ENCOUNTER — Telehealth: Payer: Self-pay | Admitting: Internal Medicine

## 2011-07-30 NOTE — Telephone Encounter (Signed)
Left message to call office

## 2011-07-30 NOTE — Telephone Encounter (Signed)
Pt states that he does not have to go through the FAA for his certification because he is not diabetic. He states that Dr. Alwyn Ren should be able to issue this and he needs it by next week. Please advise.

## 2011-08-02 DIAGNOSIS — M545 Low back pain, unspecified: Secondary | ICD-10-CM | POA: Diagnosis not present

## 2011-08-02 NOTE — Telephone Encounter (Signed)
Patient called in this morning & stated he will be available up til 10:30am today at [phone] to discuss this issue, please call as soon as you can

## 2011-08-02 NOTE — Telephone Encounter (Signed)
I called patient, Dr.Hopper spoke directly with patient

## 2011-08-03 NOTE — Telephone Encounter (Signed)
Per a call from patient this morning, per Dr.stern wrote a letter stating no problems with patients current condition for FAA Physical  Can call patient at [phone]

## 2011-08-13 NOTE — Telephone Encounter (Signed)
Mr. Casanas has type II adult onset diabetes mellitus which is well controlled with diet and exercise alone. A1c was 6.5 % off medications for more than three months.  He has no associated complications related to the diabetes. 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  Additionally I contacted his Rheumatologist, Dr Pollyann Savoy by phone  requesting  a statement concerning an isolated episode of hand spasm for which he was evaluated & labs performed.  When received ,those data   will be sent with the statement concerning his diabetes to the FAA in support of his application.                                                                                                                                                                                                                  Titus Dubin. Alwyn Ren MD, Jerrel Ivory

## 2011-08-17 DIAGNOSIS — B029 Zoster without complications: Secondary | ICD-10-CM | POA: Diagnosis not present

## 2011-08-18 DIAGNOSIS — B029 Zoster without complications: Secondary | ICD-10-CM | POA: Diagnosis not present

## 2011-08-19 DIAGNOSIS — B0230 Zoster ocular disease, unspecified: Secondary | ICD-10-CM | POA: Diagnosis not present

## 2011-08-19 DIAGNOSIS — H1030 Unspecified acute conjunctivitis, unspecified eye: Secondary | ICD-10-CM | POA: Diagnosis not present

## 2011-08-30 ENCOUNTER — Telehealth: Payer: Self-pay | Admitting: Internal Medicine

## 2011-08-30 ENCOUNTER — Encounter: Payer: Self-pay | Admitting: Internal Medicine

## 2011-08-30 NOTE — Telephone Encounter (Signed)
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 Pt states it has been too long and this needs to be resolved.

## 2011-08-31 NOTE — Telephone Encounter (Signed)
I called patient and Dr.Hopper then picked up on the call. A letter was dictated yesterday and is in process to be submitted to the [facility]

## 2011-09-06 DIAGNOSIS — M545 Low back pain, unspecified: Secondary | ICD-10-CM | POA: Diagnosis not present

## 2011-09-07 ENCOUNTER — Other Ambulatory Visit: Payer: Self-pay | Admitting: Neurosurgery

## 2011-09-24 ENCOUNTER — Encounter (HOSPITAL_COMMUNITY): Payer: Self-pay | Admitting: Pharmacy Technician

## 2011-09-30 ENCOUNTER — Other Ambulatory Visit: Payer: Self-pay

## 2011-09-30 ENCOUNTER — Encounter (HOSPITAL_COMMUNITY): Payer: Self-pay

## 2011-09-30 ENCOUNTER — Encounter (HOSPITAL_COMMUNITY)
Admission: RE | Admit: 2011-09-30 | Discharge: 2011-09-30 | Disposition: A | Discharge status: Home / Self Care | Payer: Medicare Other | Source: Ambulatory Visit | Attending: Neurosurgery | Admitting: Neurosurgery

## 2011-09-30 DIAGNOSIS — E039 Hypothyroidism, unspecified: Secondary | ICD-10-CM | POA: Diagnosis not present

## 2011-09-30 DIAGNOSIS — IMO0002 Reserved for concepts with insufficient information to code with codable children: Secondary | ICD-10-CM | POA: Diagnosis not present

## 2011-09-30 DIAGNOSIS — M48061 Spinal stenosis, lumbar region without neurogenic claudication: Secondary | ICD-10-CM | POA: Diagnosis not present

## 2011-09-30 DIAGNOSIS — M7130 Other bursal cyst, unspecified site: Secondary | ICD-10-CM | POA: Diagnosis not present

## 2011-09-30 HISTORY — DX: Gastro-esophageal reflux disease without esophagitis: K21.9

## 2011-09-30 HISTORY — DX: Essential (primary) hypertension: I10

## 2011-09-30 LAB — BASIC METABOLIC PANEL
BUN: 15 mg/dL (ref 6–23)
CO2: 28 mEq/L (ref 19–32)
Calcium: 9.3 mg/dL (ref 8.4–10.5)
Chloride: 98 mEq/L (ref 96–112)
Creatinine, Ser: 0.94 mg/dL (ref 0.50–1.35)
GFR calc Af Amer: >90 mL/min (ref >90)
GFR calc non Af Amer: 79 mL/min — ABNORMAL LOW (ref >90)
Glucose, Bld: 107 mg/dL — ABNORMAL HIGH (ref 70–99)
Potassium: 5 mEq/L (ref 3.5–5.1)
Sodium: 134 mEq/L — ABNORMAL LOW (ref 135–145)

## 2011-09-30 LAB — CBC
HCT: 37.2 % — ABNORMAL LOW (ref 39.0–52.0)
Hemoglobin: 12.6 g/dL — ABNORMAL LOW (ref 13.0–17.0)
MCH: 31 pg (ref 26.0–34.0)
MCHC: 33.9 g/dL (ref 30.0–36.0)
MCV: 91.6 fL (ref 78.0–100.0)
Platelets: 193 10*3/uL (ref 150–400)
RBC: 4.06 MIL/uL — ABNORMAL LOW (ref 4.22–5.81)
RDW: 14.3 % (ref 11.5–15.5)
WBC: 4.8 10*3/uL (ref 4.0–10.5)

## 2011-09-30 LAB — SURGICAL PCR SCREEN
MRSA, PCR: NEGATIVE
Staphylococcus aureus: POSITIVE — AB

## 2011-09-30 NOTE — Pre-Procedure Instructions (Signed)
20 Kyle Simon  09/30/2011   Your procedure is scheduled on:  August 8th  Report to [facility] at 0645 AM.  Call this number if you have problems the morning of surgery: [phone]   Remember:   Do not eat food or drink:After Midnight.  Take these medicines the morning of surgery with A SIP OF WATER: Synthroid, Zantac, Flomax  Do not wear jewelry, make-up or nail polish.  Do not wear lotions, powders, or perfumes.   Do not shave 48 hours prior to surgery. Men may shave face and neck.  Do not bring valuables to the hospital.  Contacts, dentures or bridgework may not be worn into surgery.  Leave suitcase in the car. After surgery it may be brought to your room.  For patients admitted to the hospital, checkout time is 11:00 AM the day of discharge.   Special Instructions: CHG Shower Use Special Wash: 1/2 bottle night before surgery and 1/2 bottle morning of surgery.   Please read over the following fact sheets that you were given: Pain Booklet, Coughing and Deep Breathing, MRSA Information and Surgical Site Infection Prevention

## 2011-09-30 NOTE — Progress Notes (Signed)
Dr. Alwyn Ren is his primary Has seen Dr. Graciela Husbands in 2012 - had stress test that was normal in 2012; echo in 2012; did have a ekg in February but is not a 12 lead will get an updated EKG.

## 2011-10-06 MED ORDER — CEFAZOLIN SODIUM-DEXTROSE 2-3 GM-% IV SOLR
2.0000 g | INTRAVENOUS | Status: AC
  Administered 2011-10-07: 2 g via INTRAVENOUS
  Filled 2011-10-06: qty 50

## 2011-10-06 NOTE — H&P (Signed)
Marveen Reeks. Damore  #[id number] DOB: 05/12/1932 09/06/2011:  Remigio Eisenmenger returns today.  He says he wants to have the cyst removed.  His signs and symptoms have returned. He is not getting a long time of relief. This last aspiration did not last him that long.  He wishes to proceed with surgery in early August and I have gone ahead and scheduled him for that.  Risks and benefits were discussed in detail with the patient and he wishes to proceed, and we are planning on going ahead in early August for a right L4-5 laminectomy and resection of synovial cyst.          Danae Orleans. Venetia Maxon, M.D./[facility]  Marveen Reeks. Malczewski  #[id number]  DOB:  Mar 25, 1932     April 14, 2009   HISTORY OF PRESENT ILLNESS:  Bertrand Vowels is a 78 year old, retired man with back and left hip pain who presents at the request of Dr. Pollyann Savoy for neurosurgical consultation in which he complains of left-sided low back and left lateral thigh and calf pain.  He has a long history of lumbar disc problems in 1988 affecting his left leg and his right leg in 1997.  He has a history of a dull headache in the back of his head recently and says he has never had problems with headaches beforehand.  He has been taking Celebrex 200 mg daily, Tylenol 1000 mg qhs.  He has been to a chiropractor in the past which helped him.  He currently complains of left low back pain and left lateral shin pain and says it has been debilitating a few times and he has been difficult for him to sleep.  He also describes that he had polio as a child.    REVIEW OF SYSTEMS:   Review of Systems was reviewed with the patient.  Pertinent positives include ringing in ears, last EKG 2010, back pain, leg pain, arthritis, diabetes, thyroid disease.    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 meds until it caused damage to prostate gland, 1981 pneumonia - hospital 4 days, 1983 pneumonia, 1986 hernia operation left side, 1988 back problems, herniated disc #4, problems with sciatic nerve in left leg, taking Advil every day, 1994 severe breaking out on face, biopsy proved nothing, took prednisone to cure, still have problems once/twice year, 1995 sinus and throat operation, 1996 thyroid deterioration, taking Synthroid for life, 1997 additional back problems, disc #5, tingling in right leg, 1997 dry eyes, must use artificial tears twice every day, 1997 good and bad cholesterol too close, taking Crestor, 1998 torn ligaments in right ankle, wore cast to heal, 1998 urinary blockage medically treated and cured, 2002 torn meniscus in right knee, surgery to repair worked okay, 2003 cataract in right eye, implant installed, vision excellent, 2005 hernia lower right side, surgery repair okay, 2006 hernia lower right side, surgery repair okay, 2007 torn meniscus in left knee, surgery to repair, still having problems, 2007 cataract surgery in left eye, implant installed, vision excellent, 2008 urinary problems, shaved  prostate.    Medications and Allergies:  Current medications include Synthroid .15 mg qpm, Flomax .4 mg qpm, Bayer Aspirin 81 mg qpm, Crestor 5 mg qpm, Celebrex 200 mg qam, Metformin HCL 500 mg qpm, Actonel 150 mg monthly, Lamisil 250 mg qpm, Vitamin E 200 IU qam, Vitamin B12 1000 mg qpm, Glucosamine 1000 mg qam, Silver Multivitamins qam, Fish Oil 1000 mg qam/qpm, Zinc 60 mg qpm, Vitamin D 5000 IU qam.  No known drug allergies.       Height and Weight:  He is currently  5', 10" tall and 195 lbs. with a BMI of 28.    FAMILY HISTORY:    Both parents deceased cause unspecified.    SOCIAL HISTORY:    He denies tobacco or drug use and social drinker of alcoholic beverages.    DIAGNOSTIC STUDIES:   Mr. Fayette had an MRI of his lumbar spine performed through [facility] which demonstrates retrolisthesis at L2 on L3 of 4 mm. and with a large synovial cyst on L4-5 causes marked compression of the thecal sac on the left and mass effect on the left L5, S1 and S2 nerve roots.  There is some interval development of moderate right facet joint arthritis and ligamentous hypertrophy at L4-5.    PHYSICAL EXAMINATION:      General Appearance:  Mr. Perine is a pleasant, cooperative man in no acute distress.      Blood Pressure and Pulse:  Blood pressure is 138/88.  Heart rate is 62 and regular.      HEENT - normocephalic, atraumatic.  The pupils are equal, round and reactive to light.  The extraocular muscles are intact.  Sclerae - white.  Conjunctiva - pink.  Oropharynx benign.  Uvula midline.     Neck - there are no masses, meningismus, deformities, tracheal deviation, jugular vein distention or carotid bruits.  There is normal cervical range of motion.  Spurlings' test is negative without reproducible radicular pain turning the patient's head to either side.  Lhermitte's sign is not present with axial compression.      Respiratory - there is normal respiratory effort with good intercostal function.  Lungs are clear to auscultation.  There are no rales, rhonchi or wheezes.      Cardiovascular - the heart has regular rate and rhythm to auscultation.  No murmurs are appreciated.  There is no extremity edema, cyanosis or clubbing.  There are palpable pedal pulses.      Abdomen - soft, nontender, no hepatosplenomegaly appreciated or masses.  There are active bowel sounds.  No guarding or rebound.    Musculoskeletal Examination - he does have some kyphosis centered at  L2-3 without a lot of back pain in this location.  He has left sciatic notch discomfort to palpation.  He is able to bend to within 6 inches of the floor with his upper extremities outstretched.  He walks with an antalgic gait favoring his left lower extremity.  He has a positive straight leg raise at 45 degrees on the left.    NEUROLOGICAL EXAMINATION: The patient is oriented to time, person and place and has good recall of both recent and remote memory with normal attention span and concentration.  The patient speaks with clear and fluent speech and exhibits normal language function and appropriate fund of knowledge.      Cranial Nerve Examination - pupils are equal, round and reactive to light.  Extraocular movements are full.  Visual fields are full to confrontational testing.  Facial sensation and facial movement are symmetric and intact.  Hearing is intact to finger rub.  Palate is upgoing.  Shoulder shrug is symmetric.  Tongue protrudes in the midline.      Motor Examination - motor strength is 5/5 in the bilateral deltoids, biceps, triceps, handgrips, wrist extensors, interosseous.  In the lower extremities motor strength is 5/5 with the exception of 4/5 left EHL strength, 4/5 left hip abductor strength.        Sensory Examination - he has decreased pin sensation in the left L5 distribution.      Deep Tendon Reflexes - 2 in the biceps, triceps and brachioradialis, 2 at the knees, 2 at the ankles and great toes are downgoing to plantar stimulation.      Cerebellar Examination - normal coordination in upper and lower extremities and normal rapid alternating movements.  Romberg test is negative.    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  I believe that he has a left L5 radiculopathy on examination.  I believe this is the basis for his severe pain complaints, as well as weakness and,  based on this evaluation, I have recommended that he undergo lumbar laminectomy and resection of the synovial cyst.  He wishes to proceed.  We discussed risks and benefits of surgery including potential for CSF leak.  The plan is to proceed with surgery on March 1st, 2011.  He knows he has the potential for recurrence of synovial cyst, as well as developing instability in his lumbar spine to the point he may require fusion.  He wishes to proceed.       [facility]    Danae Orleans. Venetia Maxon, M.D.

## 2011-10-07 ENCOUNTER — Encounter (HOSPITAL_COMMUNITY)
Admission: RE | Disposition: A | Discharge status: Home / Self Care | Payer: Self-pay | Source: Ambulatory Visit | Attending: Neurosurgery

## 2011-10-07 ENCOUNTER — Encounter (HOSPITAL_COMMUNITY): Payer: Self-pay | Admitting: Anesthesiology

## 2011-10-07 ENCOUNTER — Inpatient Hospital Stay (HOSPITAL_COMMUNITY): Payer: Medicare Other

## 2011-10-07 ENCOUNTER — Encounter (HOSPITAL_COMMUNITY): Payer: Self-pay | Admitting: Vascular Surgery

## 2011-10-07 ENCOUNTER — Encounter (HOSPITAL_COMMUNITY): Payer: Self-pay | Admitting: *Deleted

## 2011-10-07 ENCOUNTER — Inpatient Hospital Stay (HOSPITAL_COMMUNITY)
Admission: RE | Admit: 2011-10-07 | Discharge: 2011-10-07 | DRG: 491 | Disposition: A | Discharge status: Home / Self Care | Payer: Medicare Other | Source: Ambulatory Visit | Attending: Neurosurgery | Admitting: Neurosurgery

## 2011-10-07 ENCOUNTER — Inpatient Hospital Stay (HOSPITAL_COMMUNITY): Payer: Medicare Other | Admitting: Anesthesiology

## 2011-10-07 DIAGNOSIS — M545 Low back pain, unspecified: Secondary | ICD-10-CM | POA: Diagnosis not present

## 2011-10-07 DIAGNOSIS — Z87891 Personal history of nicotine dependence: Secondary | ICD-10-CM | POA: Diagnosis not present

## 2011-10-07 DIAGNOSIS — E119 Type 2 diabetes mellitus without complications: Secondary | ICD-10-CM | POA: Diagnosis present

## 2011-10-07 DIAGNOSIS — Z96659 Presence of unspecified artificial knee joint: Secondary | ICD-10-CM | POA: Diagnosis not present

## 2011-10-07 DIAGNOSIS — Z01810 Encounter for preprocedural cardiovascular examination: Secondary | ICD-10-CM

## 2011-10-07 DIAGNOSIS — M48061 Spinal stenosis, lumbar region without neurogenic claudication: Secondary | ICD-10-CM | POA: Diagnosis present

## 2011-10-07 DIAGNOSIS — E785 Hyperlipidemia, unspecified: Secondary | ICD-10-CM | POA: Diagnosis present

## 2011-10-07 DIAGNOSIS — M519 Unspecified thoracic, thoracolumbar and lumbosacral intervertebral disc disorder: Secondary | ICD-10-CM | POA: Diagnosis not present

## 2011-10-07 DIAGNOSIS — E039 Hypothyroidism, unspecified: Secondary | ICD-10-CM | POA: Diagnosis not present

## 2011-10-07 DIAGNOSIS — K219 Gastro-esophageal reflux disease without esophagitis: Secondary | ICD-10-CM | POA: Diagnosis present

## 2011-10-07 DIAGNOSIS — IMO0002 Reserved for concepts with insufficient information to code with codable children: Secondary | ICD-10-CM | POA: Diagnosis not present

## 2011-10-07 DIAGNOSIS — M7130 Other bursal cyst, unspecified site: Secondary | ICD-10-CM | POA: Diagnosis not present

## 2011-10-07 DIAGNOSIS — Z01812 Encounter for preprocedural laboratory examination: Secondary | ICD-10-CM | POA: Diagnosis not present

## 2011-10-07 DIAGNOSIS — M25559 Pain in unspecified hip: Secondary | ICD-10-CM | POA: Diagnosis not present

## 2011-10-07 LAB — GLUCOSE, CAPILLARY
Glucose-Capillary: 104 mg/dL — ABNORMAL HIGH (ref 70–99)
Glucose-Capillary: 126 mg/dL — ABNORMAL HIGH (ref 70–99)

## 2011-10-07 SURGERY — LUMBAR LAMINECTOMY FOR EPIDURAL ABSCESS
Anesthesia: General | Site: Back | Laterality: Right | Wound class: Clean

## 2011-10-07 MED ORDER — EPHEDRINE SULFATE 50 MG/ML IJ SOLN
INTRAMUSCULAR | Status: DC | PRN
  Administered 2011-10-07 (×3): 5 mg via INTRAVENOUS

## 2011-10-07 MED ORDER — METHYLPREDNISOLONE ACETATE 80 MG/ML IJ SUSP
INTRAMUSCULAR | Status: DC | PRN
  Administered 2011-10-07: 80 mg via INTRA_ARTICULAR

## 2011-10-07 MED ORDER — ACETAMINOPHEN 650 MG RE SUPP: 650.0000 mg | RECTAL | Status: DC | PRN

## 2011-10-07 MED ORDER — LIDOCAINE HCL (CARDIAC) 20 MG/ML IV SOLN
INTRAVENOUS | Status: DC | PRN
  Administered 2011-10-07: 60 mg via INTRAVENOUS

## 2011-10-07 MED ORDER — SODIUM CHLORIDE 0.9 % IJ SOLN: 3.0000 mL | INTRAMUSCULAR | Status: DC | PRN

## 2011-10-07 MED ORDER — DOCUSATE SODIUM 100 MG PO CAPS: 100.0000 mg | ORAL_CAPSULE | Freq: Two times a day (BID) | ORAL | Status: DC

## 2011-10-07 MED ORDER — PHENOL 1.4 % MT LIQD: 1.0000 | OROMUCOSAL | Status: DC | PRN

## 2011-10-07 MED ORDER — SODIUM CHLORIDE 0.9 % IJ SOLN: 3.0000 mL | Freq: Two times a day (BID) | INTRAMUSCULAR | Status: DC

## 2011-10-07 MED ORDER — PROMETHAZINE HCL 25 MG/ML IJ SOLN: 6.2500 mg | INTRAMUSCULAR | Status: DC | PRN

## 2011-10-07 MED ORDER — ZOLPIDEM TARTRATE 5 MG PO TABS: 5.0000 mg | ORAL_TABLET | Freq: Every evening | ORAL | Status: DC | PRN

## 2011-10-07 MED ORDER — ADULT MULTIVITAMIN W/MINERALS CH
1.0000 | ORAL_TABLET | Freq: Every day | ORAL | Status: DC
  Filled 2011-10-07: qty 1

## 2011-10-07 MED ORDER — FENTANYL CITRATE 0.05 MG/ML IJ SOLN
INTRAMUSCULAR | Status: DC | PRN
  Administered 2011-10-07: 100 ug via INTRAVENOUS

## 2011-10-07 MED ORDER — ASPIRIN EC 81 MG PO TBEC
81.0000 mg | DELAYED_RELEASE_TABLET | Freq: Every day | ORAL | Status: DC
  Filled 2011-10-07: qty 1

## 2011-10-07 MED ORDER — MENTHOL 3 MG MT LOZG: 1.0000 | LOZENGE | OROMUCOSAL | Status: DC | PRN

## 2011-10-07 MED ORDER — ROCURONIUM BROMIDE 100 MG/10ML IV SOLN
INTRAVENOUS | Status: DC | PRN
  Administered 2011-10-07: 50 mg via INTRAVENOUS

## 2011-10-07 MED ORDER — 0.9 % SODIUM CHLORIDE (POUR BTL) OPTIME
TOPICAL | Status: DC | PRN
  Administered 2011-10-07: 1000 mL

## 2011-10-07 MED ORDER — LEVOTHYROXINE SODIUM 150 MCG PO TABS
150.0000 ug | ORAL_TABLET | Freq: Every day | ORAL | Status: DC
  Filled 2011-10-07: qty 1

## 2011-10-07 MED ORDER — SODIUM CHLORIDE 0.9 % IV SOLN
INTRAVENOUS | Status: AC
  Filled 2011-10-07: qty 500

## 2011-10-07 MED ORDER — HYDROCODONE-ACETAMINOPHEN 5-325 MG PO TABS: 1.0000 | ORAL_TABLET | ORAL | Status: DC | PRN

## 2011-10-07 MED ORDER — TAMSULOSIN HCL 0.4 MG PO CAPS
0.4000 mg | ORAL_CAPSULE | Freq: Two times a day (BID) | ORAL | Status: DC
  Filled 2011-10-07: qty 1

## 2011-10-07 MED ORDER — LACTATED RINGERS IV SOLN
INTRAVENOUS | Status: DC | PRN
  Administered 2011-10-07 (×2): via INTRAVENOUS

## 2011-10-07 MED ORDER — MEPERIDINE HCL 25 MG/ML IJ SOLN: 6.2500 mg | INTRAMUSCULAR | Status: DC | PRN

## 2011-10-07 MED ORDER — ONDANSETRON HCL 4 MG/2ML IJ SOLN: 4.0000 mg | INTRAMUSCULAR | Status: DC | PRN

## 2011-10-07 MED ORDER — HYDROMORPHONE HCL PF 1 MG/ML IJ SOLN: 0.2500 mg | INTRAMUSCULAR | Status: DC | PRN

## 2011-10-07 MED ORDER — FENTANYL CITRATE 0.05 MG/ML IJ SOLN
INTRAMUSCULAR | Status: DC | PRN
  Administered 2011-10-07: 250 ug via INTRAVENOUS

## 2011-10-07 MED ORDER — ACETAMINOPHEN 325 MG PO TABS: 650.0000 mg | ORAL_TABLET | ORAL | Status: DC | PRN

## 2011-10-07 MED ORDER — ONDANSETRON HCL 4 MG/2ML IJ SOLN
INTRAMUSCULAR | Status: DC | PRN
  Administered 2011-10-07: 4 mg via INTRAVENOUS

## 2011-10-07 MED ORDER — FENTANYL CITRATE 0.05 MG/ML IJ SOLN
INTRAMUSCULAR | Status: AC
  Filled 2011-10-07: qty 2

## 2011-10-07 MED ORDER — THROMBIN 5000 UNITS EX SOLR
CUTANEOUS | Status: DC | PRN
  Administered 2011-10-07 (×2): 5000 [IU] via TOPICAL

## 2011-10-07 MED ORDER — OXYCODONE-ACETAMINOPHEN 5-325 MG PO TABS: 1.0000 | ORAL_TABLET | ORAL | Status: DC | PRN

## 2011-10-07 MED ORDER — BACITRACIN 50000 UNITS IM SOLR
INTRAMUSCULAR | Status: AC
  Filled 2011-10-07: qty 1

## 2011-10-07 MED ORDER — CEFAZOLIN SODIUM 1-5 GM-% IV SOLN
1.0000 g | Freq: Three times a day (TID) | INTRAVENOUS | Status: DC
  Filled 2011-10-07 (×2): qty 50

## 2011-10-07 MED ORDER — FAMOTIDINE 10 MG PO TABS
10.0000 mg | ORAL_TABLET | Freq: Two times a day (BID) | ORAL | Status: DC
  Filled 2011-10-07: qty 1

## 2011-10-07 MED ORDER — LOSARTAN POTASSIUM 25 MG PO TABS
25.0000 mg | ORAL_TABLET | Freq: Every day | ORAL | Status: DC
  Filled 2011-10-07: qty 1

## 2011-10-07 MED ORDER — LIDOCAINE-EPINEPHRINE 1 %-1:100000 IJ SOLN
INTRAMUSCULAR | Status: DC | PRN
  Administered 2011-10-07: 10 mL

## 2011-10-07 MED ORDER — ARTIFICIAL TEARS OP OINT
TOPICAL_OINTMENT | OPHTHALMIC | Status: DC | PRN
  Filled 2011-10-07: qty 3.5

## 2011-10-07 MED ORDER — NEOSTIGMINE METHYLSULFATE 1 MG/ML IJ SOLN
INTRAMUSCULAR | Status: DC | PRN
  Administered 2011-10-07: 4 mg via INTRAVENOUS

## 2011-10-07 MED ORDER — MIDAZOLAM HCL 2 MG/2ML IJ SOLN: 0.5000 mg | Freq: Once | INTRAMUSCULAR | Status: DC | PRN

## 2011-10-07 MED ORDER — SODIUM CHLORIDE 0.9 % IR SOLN
Status: DC | PRN
  Administered 2011-10-07: 10:00:00

## 2011-10-07 MED ORDER — HEMOSTATIC AGENTS (NO CHARGE) OPTIME
TOPICAL | Status: DC | PRN
  Administered 2011-10-07: 1 via TOPICAL

## 2011-10-07 MED ORDER — MORPHINE SULFATE 2 MG/ML IJ SOLN: 1.0000 mg | INTRAMUSCULAR | Status: DC | PRN

## 2011-10-07 MED ORDER — PROPOFOL 10 MG/ML IV EMUL
INTRAVENOUS | Status: DC | PRN
  Administered 2011-10-07: 130 mg via INTRAVENOUS

## 2011-10-07 MED ORDER — GLYCOPYRROLATE 0.2 MG/ML IJ SOLN
INTRAMUSCULAR | Status: DC | PRN
  Administered 2011-10-07: .6 mg via INTRAVENOUS

## 2011-10-07 MED ORDER — ATORVASTATIN CALCIUM 10 MG PO TABS
10.0000 mg | ORAL_TABLET | Freq: Every day | ORAL | Status: DC
  Filled 2011-10-07: qty 1

## 2011-10-07 MED ORDER — ALUM & MAG HYDROXIDE-SIMETH 200-200-20 MG/5ML PO SUSP: 30.0000 mL | Freq: Four times a day (QID) | ORAL | Status: DC | PRN

## 2011-10-07 MED ORDER — KCL IN DEXTROSE-NACL 20-5-0.45 MEQ/L-%-% IV SOLN
INTRAVENOUS | Status: DC
  Filled 2011-10-07 (×2): qty 1000

## 2011-10-07 MED ORDER — BUPIVACAINE HCL (PF) 0.5 % IJ SOLN
INTRAMUSCULAR | Status: DC | PRN
  Administered 2011-10-07: 10 mL

## 2011-10-07 MED ORDER — SODIUM CHLORIDE 0.9 % IV SOLN: 250.0000 mL | INTRAVENOUS | Status: DC

## 2011-10-07 MED ORDER — MIDAZOLAM HCL 5 MG/5ML IJ SOLN
INTRAMUSCULAR | Status: DC | PRN
  Administered 2011-10-07: 2 mg via INTRAVENOUS

## 2011-10-07 SURGICAL SUPPLY — 56 items
BAG DECANTER FOR FLEXI CONT (MISCELLANEOUS) ×2 IMPLANT
BENZOIN TINCTURE PRP APPL 2/3 (GAUZE/BANDAGES/DRESSINGS) IMPLANT
BIT DRILL NEURO 2X3.1 SFT TUCH (MISCELLANEOUS) ×1 IMPLANT
BLADE SURG ROTATE 9660 (MISCELLANEOUS) IMPLANT
BUR ROUND FLUTED 5 RND (BURR) ×2 IMPLANT
CANISTER SUCTION 2500CC (MISCELLANEOUS) ×2 IMPLANT
CLOTH BEACON ORANGE TIMEOUT ST (SAFETY) ×2 IMPLANT
CONT SPEC 4OZ CLIKSEAL STRL BL (MISCELLANEOUS) ×2 IMPLANT
DERMABOND ADVANCED (GAUZE/BANDAGES/DRESSINGS) ×1
DERMABOND ADVANCED .7 DNX12 (GAUZE/BANDAGES/DRESSINGS) ×1 IMPLANT
DRAPE LAPAROTOMY 100X72X124 (DRAPES) ×2 IMPLANT
DRAPE MICROSCOPE LEICA (MISCELLANEOUS) ×2 IMPLANT
DRAPE POUCH INSTRU U-SHP 10X18 (DRAPES) ×2 IMPLANT
DRAPE SURG 17X23 STRL (DRAPES) ×2 IMPLANT
DRESSING TELFA 8X3 (GAUZE/BANDAGES/DRESSINGS) IMPLANT
DRILL NEURO 2X3.1 SOFT TOUCH (MISCELLANEOUS) ×2
DURAPREP 26ML APPLICATOR (WOUND CARE) ×2 IMPLANT
ELECT REM PT RETURN 9FT ADLT (ELECTROSURGICAL) ×2
ELECTRODE REM PT RTRN 9FT ADLT (ELECTROSURGICAL) ×1 IMPLANT
GAUZE SPONGE 4X4 16PLY XRAY LF (GAUZE/BANDAGES/DRESSINGS) IMPLANT
GLOVE BIO SURGEON STRL SZ8 (GLOVE) ×6 IMPLANT
GLOVE BIOGEL PI IND STRL 7.0 (GLOVE) ×1 IMPLANT
GLOVE BIOGEL PI IND STRL 8 (GLOVE) ×1 IMPLANT
GLOVE BIOGEL PI IND STRL 8.5 (GLOVE) ×1 IMPLANT
GLOVE BIOGEL PI INDICATOR 7.0 (GLOVE) ×1
GLOVE BIOGEL PI INDICATOR 8 (GLOVE) ×1
GLOVE BIOGEL PI INDICATOR 8.5 (GLOVE) ×1
GLOVE ECLIPSE 8.0 STRL XLNG CF (GLOVE) ×2 IMPLANT
GLOVE EXAM NITRILE LRG STRL (GLOVE) IMPLANT
GLOVE EXAM NITRILE MD LF STRL (GLOVE) IMPLANT
GLOVE EXAM NITRILE XL STR (GLOVE) IMPLANT
GLOVE EXAM NITRILE XS STR PU (GLOVE) IMPLANT
GLOVE SURG SS PI 7.0 STRL IVOR (GLOVE) ×6 IMPLANT
GOWN BRE IMP SLV AUR LG STRL (GOWN DISPOSABLE) ×2 IMPLANT
GOWN BRE IMP SLV AUR XL STRL (GOWN DISPOSABLE) ×6 IMPLANT
GOWN STRL REIN 2XL LVL4 (GOWN DISPOSABLE) ×2 IMPLANT
KIT BASIN OR (CUSTOM PROCEDURE TRAY) ×2 IMPLANT
KIT ROOM TURNOVER OR (KITS) ×2 IMPLANT
NEEDLE HYPO 18GX1.5 BLUNT FILL (NEEDLE) ×2 IMPLANT
NEEDLE HYPO 25X1 1.5 SAFETY (NEEDLE) ×2 IMPLANT
NS IRRIG 1000ML POUR BTL (IV SOLUTION) ×2 IMPLANT
PACK LAMINECTOMY NEURO (CUSTOM PROCEDURE TRAY) ×2 IMPLANT
PAD ARMBOARD 7.5X6 YLW CONV (MISCELLANEOUS) ×8 IMPLANT
RUBBERBAND STERILE (MISCELLANEOUS) ×4 IMPLANT
SPONGE GAUZE 4X4 12PLY (GAUZE/BANDAGES/DRESSINGS) IMPLANT
SPONGE SURGIFOAM ABS GEL SZ50 (HEMOSTASIS) ×2 IMPLANT
STRIP CLOSURE SKIN 1/2X4 (GAUZE/BANDAGES/DRESSINGS) IMPLANT
SUT VIC AB 0 CT1 18XCR BRD8 (SUTURE) ×1 IMPLANT
SUT VIC AB 0 CT1 8-18 (SUTURE) ×1
SUT VIC AB 2-0 CT1 18 (SUTURE) ×2 IMPLANT
SUT VIC AB 3-0 SH 8-18 (SUTURE) ×2 IMPLANT
SYR 20ML ECCENTRIC (SYRINGE) ×2 IMPLANT
SYR 5ML LL (SYRINGE) ×2 IMPLANT
TOWEL OR 17X24 6PK STRL BLUE (TOWEL DISPOSABLE) ×2 IMPLANT
TOWEL OR 17X26 10 PK STRL BLUE (TOWEL DISPOSABLE) ×2 IMPLANT
WATER STERILE IRR 1000ML POUR (IV SOLUTION) ×2 IMPLANT

## 2011-10-07 NOTE — Discharge Summary (Signed)
Physician Discharge Summary  Patient ID: Kyle Simon MRN: [id number] DOB/AGE: [birth year] 78 y.o.  Admit date: 10/07/2011 Discharge date: 10/07/2011  Admission Diagnoses: Synovial Cyst, Lumbago, Lumbar stenosis L4/5 Right with lumbar radiculopathy   Discharge Diagnoses: Synovial Cyst, Lumbago, Lumbar stenosis L4/5 Right with lumbar radiculopathy s/p LUMBAR LAMINECTOMY FOR Synovial cyst Right with microdissection (Right)  Active Problems:  * No active hospital problems. *    Discharged Condition: good  Hospital Course: [name] was admitted for resection of synovial cyst/lumbar laminectomy on right at L4-5. Following uncomplicated surgery, pt recovered in Neuro PACU and transferred to 3500 for observation.   Consults: None  Significant Diagnostic Studies: radiology: X-Ray: intra-operative  Treatments: surgery: LUMBAR LAMINECTOMY FOR Synovial cyst Right with microdissection (Right)   Discharge Exam: Blood pressure 143/72, pulse 50, temperature 97.5 F (36.4 C), temperature source Oral, resp. rate 16, SpO2 93.00%. Alert, conversant, family present. Good strength BLE. Incision without erythema, swelling, or drainage.   Disposition: D/C to home. Rx given: Hydrocodone 5/325 i poq6hrs prn pain #60. Pt verbalizes understanding of d/c instructions. Pt will call office for 3week f/u appt with Dr. Venetia Maxon.   Medication List  As of 10/07/2011  1:50 PM   ASK your doctor about these medications         ARTIFICIAL TEARS OP   Place 1 drop into both eyes 2 (two) times daily.      aspirin EC 81 MG tablet   Take 81 mg by mouth daily.      FISH OIL PO   Take 1 capsule by mouth daily.      FLOMAX 0.4 MG Caps   Generic drug: Tamsulosin HCl   Take by mouth 2 (two) times daily.      Glucosamine HCl 1000 MG Tabs   Take 1 tablet by mouth daily.      levothyroxine 150 MCG tablet   Commonly known as: SYNTHROID, LEVOTHROID   Take 1 tablet (150 mcg total) by mouth daily.      losartan 50  MG tablet   Commonly known as: COZAAR   Take 25 mg by mouth daily.      multivitamin with minerals Tabs   Take 1 tablet by mouth daily.      ranitidine 150 MG tablet   Commonly known as: ZANTAC   Take 150 mg by mouth daily.      rosuvastatin 5 MG tablet   Commonly known as: CRESTOR   Take 5 mg by mouth daily.             Signed: Georgiann Cocker 10/07/2011, 1:50 PM

## 2011-10-07 NOTE — Progress Notes (Signed)
[facility] at [phone]

## 2011-10-07 NOTE — Transfer of Care (Signed)
Immediate Anesthesia Transfer of Care Note  Patient: Kyle Simon  Procedure(s) Performed: Procedure(s) (LRB): LUMBAR LAMINECTOMY FOR EPIDURAL ABSCESS (Right)  Patient Location: PACU  Anesthesia Type: General  Level of Consciousness: awake, alert , oriented and patient cooperative  Airway & Oxygen Therapy: Patient Spontanous Breathing and Patient connected to face mask oxygen  Post-op Assessment: Report given to PACU RN, Post -op Vital signs reviewed and stable and Patient moving all extremities X 4  Post vital signs: Reviewed and stable  Complications: No apparent anesthesia complications

## 2011-10-07 NOTE — Op Note (Signed)
10/07/2011  11:29 AM  PATIENT:  Kyle Simon  78 y.o. male  PRE-OPERATIVE DIAGNOSIS:  Synovial Cyst, Lumbago, Lumbar stenosis L4/5 Right with lumbar radiculopathy  POST-OPERATIVE DIAGNOSIS:  Synovial Cyst, Lumbago, Lumbar stenosis L4/5 Right with lumbar radiculopathy   PROCEDURE:  Procedure(s) (LRB): LUMBAR LAMINECTOMY FOR Synovial cyst Right with microdissection (Right)  SURGEON:  Surgeon(s) and Role:    * Maeola Harman, MD - Primary    * Tia Alert, MD - Assisting  PHYSICIAN ASSISTANT:   ASSISTANTS: Poteat, RN   ANESTHESIA:   general  EBL:  Total I/O In: 1000 [I.V.:1000] Out: 50 [Blood:50]  BLOOD ADMINISTERED:none  DRAINS: none   LOCAL MEDICATIONS USED:  LIDOCAINE   SPECIMEN:  No Specimen  DISPOSITION OF SPECIMEN:  N/A  COUNTS:  YES  TOURNIQUET:  * No tourniquets in log *  DICTATION:  DICTATION: Patient has a synovial cyst at L 4 - 5 on the right. It was elected to take him to surgery for right L4-5 resection of synovial cyst.  Patient has had multiple aspirations of the cyst without sustained relief and has had prior laminectomy for cyst at L4/5 on the left.  Procedure: Patient was brought to the operating room and following the smooth and uncomplicated induction of general endotracheal anesthesia he was placed in a prone position on the Wilson frame. Low back was prepped and draped in the usual sterile fashion with DuraPrep. Area of planned incision was infiltrated with local lidocaine. Incision was made in the midline and carried to the lumbodorsal fascia which was incised on the right side of midline. Subperiosteal dissection was performed exposing what was felt to be L45 level. Intraoperative x-ray demonstrated marker probes at L5-S1 and L4-5. A hemi-semi-laminectomy of L4 was performed a high-speed drill and completed with Kerrison rongeurs and a generous foraminotomy was performed overlying the superior aspect of the L5 lamina. Ligamentum flavum was detached and  removed in a piecemeal fashion and the L5 nerve root was decompressed laterally with removal of the superior aspect of the facet and ligamentum causing nerve root compression. The microscope was brought into the field and the L5 nerve root was mobilized medially and carefully dissected from the synovial cyst which was adherent to the dura.  Using microdissection, the cyst was removed and neural elements thoroughly decompressed. There was no violation of the dura.  The superior aspect of the cyst was adherent to the dura at the take off of the L4 nerve root.  We were able to remove the adherent cyst and decompress all neural elements. Hemostasis was assured with bipolar electrocautery and the interspace was irrigated with Depo-Medrol and fentanyl. The lumbodorsal fascia was closed with 0 Vicryl sutures the subcutaneous tissues reapproximated 2-0 Vicryl inverted sutures and the skin edges were reapproximated with 3-0 Vicryl subcuticular stitch. The wound is dressed with Dermabond. Patient was extubated in the operating room and taken to recovery in stable and satisfactory condition having tolerated his operation well counts were correct at the end of the case.  PLAN OF CARE: Admit for overnight observation  PATIENT DISPOSITION:  PACU - hemodynamically stable.   Delay start of Pharmacological VTE agent (>24hrs) due to surgical blood loss or risk of bleeding: yes

## 2011-10-07 NOTE — Anesthesia Postprocedure Evaluation (Signed)
  Anesthesia Post-op Note  Patient: Kyle Simon  Procedure(s) Performed: Procedure(s) (LRB): LUMBAR LAMINECTOMY FOR EPIDURAL ABSCESS (Right)  Patient Location: PACU  Anesthesia Type: General  Level of Consciousness: awake, alert  and oriented  Airway and Oxygen Therapy: Patient Spontanous Breathing  Post-op Pain: none  Post-op Assessment: Post-op Vital signs reviewed, Patient's Cardiovascular Status Stable, Respiratory Function Stable, Patent Airway, No signs of Nausea or vomiting and Pain level controlled  Post-op Vital Signs: Reviewed and stable  Complications: No apparent anesthesia complications

## 2011-10-07 NOTE — Anesthesia Procedure Notes (Signed)
Procedure Name: Intubation Date/Time: 10/07/2011 9:43 AM Performed by: Lanell Matar Pre-anesthesia Checklist: Emergency Drugs available, Suction available, Patient being monitored, Patient identified and Timeout performed Patient Re-evaluated:Patient Re-evaluated prior to inductionOxygen Delivery Method: Circle system utilized Preoxygenation: Pre-oxygenation with 100% oxygen Intubation Type: IV induction Ventilation: Mask ventilation without difficulty and Oral airway inserted - appropriate to patient size Laryngoscope Size: Mac and 3 Grade View: Grade II Tube type: Oral Tube size: 7.5 mm Number of attempts: 1 Airway Equipment and Method: Stylet and Oral airway Placement Confirmation: ETT inserted through vocal cords under direct vision,  positive ETCO2 and breath sounds checked- equal and bilateral Secured at: 22 cm Tube secured with: Tape Dental Injury: Teeth and Oropharynx as per pre-operative assessment

## 2011-10-07 NOTE — Anesthesia Preprocedure Evaluation (Signed)
Anesthesia Evaluation  Patient identified by MRN, date of birth, ID band Patient awake    Reviewed: Allergy & Precautions, H&P , NPO status , Patient's Chart, lab work & pertinent test results  History of Anesthesia Complications Negative for: history of anesthetic complications  Airway Mallampati: II TM Distance: >3 FB Neck ROM: Full    Dental No notable dental hx. (+) Teeth Intact, Caps and Dental Advisory Given   Pulmonary neg pneumonia -, former smoker (quit 1960's),  breath sounds clear to auscultation  Pulmonary exam normal       Cardiovascular hypertension, Pt. on medications Rhythm:Regular Rate:Normal  '12 Stress test: normal LVF, no ischemia   Neuro/Psych negative neurological ROS  negative psych ROS   GI/Hepatic Neg liver ROS, GERD-  Medicated and Controlled,  Endo/Other  Well ControlledHypothyroidism (on replacement)   Renal/GU negative Renal ROS     Musculoskeletal   Abdominal   Peds  Hematology   Anesthesia Other Findings   Reproductive/Obstetrics                           Anesthesia Physical Anesthesia Plan  ASA: II  Anesthesia Plan: General   Post-op Pain Management:    Induction: Intravenous  Airway Management Planned: Oral ETT  Additional Equipment:   Intra-op Plan:   Post-operative Plan: Extubation in OR  Informed Consent: I have reviewed the patients History and Physical, chart, labs and discussed the procedure including the risks, benefits and alternatives for the proposed anesthesia with the patient or authorized representative who has indicated his/her understanding and acceptance.   Dental advisory given  Plan Discussed with: CRNA and Surgeon  Anesthesia Plan Comments: (Plan routine monitors, GETA)        Anesthesia Quick Evaluation

## 2011-10-07 NOTE — Progress Notes (Signed)
Awake, alert, conversant.  Full strength both legs, no numbness.  Doing well.

## 2011-10-07 NOTE — Preoperative (Signed)
Beta Blockers   Reason not to administer Beta Blockers:Not Applicable 

## 2011-10-07 NOTE — Interval H&P Note (Signed)
History and Physical Interval Note:  10/07/2011 6:49 AM  Kyle Simon  has presented today for surgery, with the diagnosis of Synovial Cyst, Lumbago, Lumbar stenosis  The various methods of treatment have been discussed with the patient and family. After consideration of risks, benefits and other options for treatment, the patient has consented to  Procedure(s) (LRB): LUMBAR LAMINECTOMY FOR EPIDURAL ABSCESS (Right) as a surgical intervention .  The patient's history has been reviewed, patient examined, no change in status, stable for surgery.  I have reviewed the patient's chart and labs.  Questions were answered to the patient's satisfaction.     Austin Pongratz D  Date of Initial H&P: 10/06/2011  History reviewed, patient examined, no change in status, stable for surgery.

## 2011-10-07 NOTE — Progress Notes (Signed)
Pt's assessment unchanged. Pt given D/C instructions with Rx. Pt verbalized understanding of teaching. Pt D/C'd home via wheelchair @ 1444 per MD order. Rema Fendt, RN

## 2011-10-10 NOTE — Progress Notes (Signed)
Late Entry: Referral received for SNF. Chart reviewed and CSW has spoken with nursing  who indicates that patient is for DC to home with no home needs. CSW to sign off.  Lorri Frederick. [place]  [phone]

## 2011-10-11 NOTE — Discharge Summary (Signed)
As above.

## 2011-11-03 ENCOUNTER — Other Ambulatory Visit: Payer: Self-pay

## 2011-11-03 MED ORDER — GLUCOSE BLOOD VI STRP: ORAL_STRIP | Status: DC

## 2011-11-30 ENCOUNTER — Other Ambulatory Visit: Payer: Self-pay | Admitting: Internal Medicine

## 2011-12-01 ENCOUNTER — Telehealth: Payer: Self-pay

## 2011-12-01 NOTE — Telephone Encounter (Signed)
Samples of Crestor placed at the front for pick-up

## 2011-12-06 ENCOUNTER — Encounter: Payer: Self-pay | Admitting: Internal Medicine

## 2011-12-06 ENCOUNTER — Ambulatory Visit (INDEPENDENT_AMBULATORY_CARE_PROVIDER_SITE_OTHER): Payer: Medicare Other | Admitting: Internal Medicine

## 2011-12-06 VITALS — BP 140/80 | HR 53 | Temp 97.6°F | Wt 194.2 lb

## 2011-12-06 DIAGNOSIS — J209 Acute bronchitis, unspecified: Secondary | ICD-10-CM | POA: Diagnosis not present

## 2011-12-06 LAB — CBC WITH DIFFERENTIAL/PLATELET
Basophils Absolute: 0 10*3/uL (ref 0.0–0.1)
Basophils Relative: 0.2 % (ref 0.0–3.0)
Eosinophils Absolute: 0 10*3/uL (ref 0.0–0.7)
Eosinophils Relative: 0.6 % (ref 0.0–5.0)
HCT: 39.7 % (ref 39.0–52.0)
Hemoglobin: 12.9 g/dL — ABNORMAL LOW (ref 13.0–17.0)
Lymphocytes Relative: 17.3 % (ref 12.0–46.0)
Lymphs Abs: 1.4 10*3/uL (ref 0.7–4.0)
MCHC: 32.5 g/dL (ref 30.0–36.0)
MCV: 94.9 fl (ref 78.0–100.0)
Monocytes Absolute: 0.6 10*3/uL (ref 0.1–1.0)
Monocytes Relative: 7.5 % (ref 3.0–12.0)
Neutro Abs: 6.2 10*3/uL (ref 1.4–7.7)
Neutrophils Relative %: 74.4 % (ref 43.0–77.0)
Platelets: 244 10*3/uL (ref 150.0–400.0)
RBC: 4.18 Mil/uL — ABNORMAL LOW (ref 4.22–5.81)
RDW: 13.1 % (ref 11.5–14.6)
WBC: 8.4 10*3/uL (ref 4.5–10.5)

## 2011-12-06 MED ORDER — HYDROCODONE-HOMATROPINE 5-1.5 MG/5ML PO SYRP: 5.0000 mL | ORAL_SOLUTION | Freq: Four times a day (QID) | ORAL | Status: DC | PRN

## 2011-12-06 MED ORDER — AZITHROMYCIN 250 MG PO TABS: ORAL_TABLET | ORAL | Status: DC

## 2011-12-06 NOTE — Progress Notes (Signed)
  Subjective:    Patient ID: Kyle Simon, male    DOB: Oct 05, 1932, 78 y.o.   MRN: [id number]  HPI  He describes a cough which is intermittently productive of yellow-green sputum over the last 4-5 weeks. He describes a cough as dry "75% of the time". Over-the-counter cough preparations have been of benefit temporarily. There was no specific trigger or exacerbating factor for his cough. It tends to be worse at night when he is supine   Review of Systems He denies fever, chills, or sweats. The symptoms were not preceded by any extrinsic symptoms such as itchy, watery eyes, or sneezing. He denies associated frontal headache, facial pain, nasal purulence, dental pain, or sore throat. He's also had no pleuritic pain, shortness of breath, wheezing, otic pain or discharge.  He denies any significant reflux symptoms is playing a role.  He is not on ACE inhibitor     Objective:   Physical Exam General appearance:good health ;well nourished; no acute distress or increased work of breathing is present.  No  lymphadenopathy about the head, neck, or axilla noted. Appears younger than stated age   Eyes: No conjunctival inflammation or lid edema is present.   Ears:  External ear exam shows no significant lesions or deformities.  Otoscopic examination reveals clear canals, tympanic membranes are intact bilaterally without bulging, retraction, inflammation or discharge.  Nose:  External nasal examination shows no deformity or inflammation. Nasal mucosa are pink and moist without lesions or exudates. No septal dislocation or deviation.No obstruction to airflow.   Oral exam: Dental hygiene is good; lips and gums are healthy appearing.There is no oropharyngeal erythema or exudate noted. Tatoo of soft palate  Neck:  No deformities,  masses, or tenderness noted.     Heart:  Normal rate and regular rhythm. S1 and S2 normal without gallop, click, rub or other extra sounds. Grade 1/6 systolic murmur    Lungs:Chest clear to auscultation; no wheezes, rhonchi,rales ,or rubs present.No increased work of breathing.    Extremities:  No cyanosis, edema, or clubbing  noted    Skin: Warm & dry           Assessment & Plan:  #1 acute bronchitis w/o bronchospasm Plan: See orders and recommendations

## 2011-12-06 NOTE — Patient Instructions (Addendum)
Plain Mucinex for thick secretions ;force NON dairy fluids . Use a Neti pot daily as needed for sinus congestion; going from open side to congested side . Nasal cleansing in the shower as discussed. Make sure that all residual soap is removed to prevent irritation.  Plain Allegra 160 daily as needed for itchy eyes & sneezing.    

## 2011-12-07 ENCOUNTER — Ambulatory Visit (INDEPENDENT_AMBULATORY_CARE_PROVIDER_SITE_OTHER)
Admission: RE | Admit: 2011-12-07 | Discharge: 2011-12-07 | Disposition: A | Discharge status: Home / Self Care | Payer: Medicare Other | Source: Ambulatory Visit | Attending: Internal Medicine | Admitting: Internal Medicine

## 2011-12-07 ENCOUNTER — Other Ambulatory Visit: Payer: Self-pay | Admitting: Internal Medicine

## 2011-12-07 DIAGNOSIS — J209 Acute bronchitis, unspecified: Secondary | ICD-10-CM

## 2011-12-07 MED ORDER — AZITHROMYCIN 250 MG PO TABS: ORAL_TABLET | ORAL | Status: DC

## 2011-12-07 NOTE — Telephone Encounter (Signed)
RX sent

## 2011-12-07 NOTE — Telephone Encounter (Signed)
I called [facility] and Canceled rx. I was told that there is a 50/50 chance that it may not get canceled for it has been processed too far.  I called patient and left message informing him of my conversation with [facility]

## 2011-12-07 NOTE — Telephone Encounter (Signed)
Pt called and stated [facility]- [facility] [address]. He would like a call once it has been placed

## 2011-12-30 DIAGNOSIS — M545 Low back pain, unspecified: Secondary | ICD-10-CM | POA: Diagnosis not present

## 2011-12-30 DIAGNOSIS — M7120 Synovial cyst of popliteal space [Baker], unspecified knee: Secondary | ICD-10-CM | POA: Diagnosis not present

## 2011-12-30 DIAGNOSIS — M25569 Pain in unspecified knee: Secondary | ICD-10-CM | POA: Diagnosis not present

## 2012-01-03 DIAGNOSIS — Z79899 Other long term (current) drug therapy: Secondary | ICD-10-CM | POA: Diagnosis not present

## 2012-01-03 DIAGNOSIS — R5383 Other fatigue: Secondary | ICD-10-CM | POA: Diagnosis not present

## 2012-01-03 DIAGNOSIS — R5381 Other malaise: Secondary | ICD-10-CM | POA: Diagnosis not present

## 2012-01-13 DIAGNOSIS — M25569 Pain in unspecified knee: Secondary | ICD-10-CM | POA: Diagnosis not present

## 2012-01-29 ENCOUNTER — Other Ambulatory Visit: Payer: Self-pay | Admitting: Internal Medicine

## 2012-03-09 ENCOUNTER — Other Ambulatory Visit: Payer: Self-pay | Admitting: Internal Medicine

## 2012-03-13 ENCOUNTER — Ambulatory Visit (INDEPENDENT_AMBULATORY_CARE_PROVIDER_SITE_OTHER): Payer: Medicare Other | Admitting: Internal Medicine

## 2012-03-13 ENCOUNTER — Encounter: Payer: Self-pay | Admitting: Internal Medicine

## 2012-03-13 VITALS — BP 126/80 | HR 56 | Temp 97.6°F | Wt 198.0 lb

## 2012-03-13 DIAGNOSIS — J209 Acute bronchitis, unspecified: Secondary | ICD-10-CM

## 2012-03-13 DIAGNOSIS — E119 Type 2 diabetes mellitus without complications: Secondary | ICD-10-CM | POA: Diagnosis not present

## 2012-03-13 MED ORDER — HYDROCODONE-HOMATROPINE 5-1.5 MG/5ML PO SYRP: 5.0000 mL | ORAL_SOLUTION | Freq: Four times a day (QID) | ORAL | Status: DC | PRN

## 2012-03-13 MED ORDER — AZITHROMYCIN 250 MG PO TABS: ORAL_TABLET | ORAL | Status: DC

## 2012-03-13 NOTE — Progress Notes (Signed)
  Subjective:    Patient ID: Kyle Simon, male    DOB: Aug 26, 1932, 79 y.o.   MRN: [id number]  HPI The respiratory tract symptoms began  3 weeks as dry cough. On 1/10 cough was significant with SS burning . Sat 1/11 sore throat appeared  with green/ gray sputum .  Significant associated symptoms included minor dental pain.  Treatment with  Dayquil,  Nyquil was partially effective  There is no history of asthma. The patient had  quit smoking in 1968              Review of Systems Symptoms not present included frontal headache, facial pain,  nasal purulence, earache , and otic discharge  Fever , chills and sweats not present   . Cough was not associated with shortness of breath or wheezing    Itchy , watery eyes & sneezing were not noted.      Objective:   Physical Exam General appearance:good health ;well nourished; no acute distress or increased work of breathing is present.  No  lymphadenopathy about the head, neck, or axilla noted. Appears younger than stated age  Eyes: No conjunctival inflammation or lid edema is present. Ears:  External ear exam shows no significant lesions or deformities.  Otoscopic examination reveals clear canals, tympanic membranes are intact bilaterally without bulging, retraction, inflammation or discharge. Nose:  External nasal examination shows no deformity or inflammation. Nasal mucosa are pink and moist without lesions or exudates. No septal dislocation or deviation.No obstruction to airflow.  Oral exam: Dental hygiene is good; lips and gums are healthy appearing.There is no oropharyngeal erythema or exudate noted. Punctate tatoo soft palate Neck:  No deformities, thyromegaly, masses, or tenderness noted.   Heart:  Normal rate and regular rhythm. S1 and S2 normal without gallop,  click, rub or other extra sounds. Grade 1/6 systolic murmur  Lungs:Chest clear to auscultation; no wheezes, rhonchi,rales ,or rubs present.No increased work of  breathing.   Extremities:  No cyanosis, edema, or clubbing  noted  Skin: Warm & dry          Assessment & Plan:  #1 acute bronchitis w/o bronchospasm Plan: See orders and recommendations

## 2012-03-13 NOTE — Patient Instructions (Addendum)
Review and correct the record as indicated. Please share record with all medical staff seen.  

## 2012-03-14 NOTE — Addendum Note (Signed)
Addended by: Silvio Pate D on: 03/14/2012 12:02 PM   Modules accepted: Orders

## 2012-03-16 ENCOUNTER — Other Ambulatory Visit (INDEPENDENT_AMBULATORY_CARE_PROVIDER_SITE_OTHER): Payer: Medicare Other

## 2012-03-16 DIAGNOSIS — E119 Type 2 diabetes mellitus without complications: Secondary | ICD-10-CM | POA: Diagnosis not present

## 2012-03-16 DIAGNOSIS — J209 Acute bronchitis, unspecified: Secondary | ICD-10-CM | POA: Diagnosis not present

## 2012-03-16 LAB — MICROALBUMIN / CREATININE URINE RATIO
Creatinine,U: 106.5 mg/dL
Microalb Creat Ratio: 0.3 mg/g (ref 0.0–30.0)
Microalb, Ur: 0.3 mg/dL (ref 0.0–1.9)

## 2012-03-16 LAB — LIPID PANEL
Cholesterol: 118 mg/dL (ref 0–200)
HDL: 38.1 mg/dL — ABNORMAL LOW (ref >39.00)
LDL Cholesterol: 60 mg/dL (ref 0–99)
Total CHOL/HDL Ratio: 3
Triglycerides: 99 mg/dL (ref 0.0–149.0)
VLDL: 19.8 mg/dL (ref 0.0–40.0)

## 2012-03-16 LAB — HEMOGLOBIN A1C: Hgb A1c MFr Bld: 6.7 % — ABNORMAL HIGH (ref 4.6–6.5)

## 2012-03-17 ENCOUNTER — Encounter: Payer: Self-pay | Admitting: Gastroenterology

## 2012-04-07 ENCOUNTER — Encounter: Payer: Self-pay | Admitting: Gastroenterology

## 2012-04-15 ENCOUNTER — Other Ambulatory Visit: Payer: Self-pay

## 2012-04-21 DIAGNOSIS — M25569 Pain in unspecified knee: Secondary | ICD-10-CM | POA: Diagnosis not present

## 2012-04-24 ENCOUNTER — Encounter: Payer: Self-pay | Admitting: Internal Medicine

## 2012-05-02 DIAGNOSIS — D3130 Benign neoplasm of unspecified choroid: Secondary | ICD-10-CM | POA: Diagnosis not present

## 2012-05-02 DIAGNOSIS — H26499 Other secondary cataract, unspecified eye: Secondary | ICD-10-CM | POA: Diagnosis not present

## 2012-05-02 DIAGNOSIS — E119 Type 2 diabetes mellitus without complications: Secondary | ICD-10-CM | POA: Diagnosis not present

## 2012-05-03 ENCOUNTER — Ambulatory Visit (AMBULATORY_SURGERY_CENTER): Payer: Medicare Other | Admitting: *Deleted

## 2012-05-03 VITALS — Ht 71.0 in | Wt 190.0 lb

## 2012-05-03 DIAGNOSIS — Z1211 Encounter for screening for malignant neoplasm of colon: Secondary | ICD-10-CM

## 2012-05-03 MED ORDER — NA SULFATE-K SULFATE-MG SULF 17.5-3.13-1.6 GM/177ML PO SOLN: ORAL | Status: DC

## 2012-05-16 ENCOUNTER — Other Ambulatory Visit (INDEPENDENT_AMBULATORY_CARE_PROVIDER_SITE_OTHER): Payer: Medicare Other

## 2012-05-16 DIAGNOSIS — E039 Hypothyroidism, unspecified: Secondary | ICD-10-CM | POA: Diagnosis not present

## 2012-05-16 DIAGNOSIS — M25569 Pain in unspecified knee: Secondary | ICD-10-CM | POA: Diagnosis not present

## 2012-05-16 LAB — TSH: TSH: 1.46 u[IU]/mL (ref 0.35–5.50)

## 2012-05-17 ENCOUNTER — Ambulatory Visit (AMBULATORY_SURGERY_CENTER): Payer: Medicare Other | Admitting: Gastroenterology

## 2012-05-17 ENCOUNTER — Encounter: Payer: Self-pay | Admitting: Gastroenterology

## 2012-05-17 ENCOUNTER — Other Ambulatory Visit: Payer: Self-pay

## 2012-05-17 VITALS — BP 127/74 | HR 44 | Temp 96.4°F | Resp 18 | Ht 71.0 in | Wt 190.0 lb

## 2012-05-17 DIAGNOSIS — K5730 Diverticulosis of large intestine without perforation or abscess without bleeding: Secondary | ICD-10-CM

## 2012-05-17 DIAGNOSIS — Z1211 Encounter for screening for malignant neoplasm of colon: Secondary | ICD-10-CM | POA: Diagnosis not present

## 2012-05-17 LAB — HM COLONOSCOPY

## 2012-05-17 MED ORDER — LEVOTHYROXINE SODIUM 150 MCG PO TABS: ORAL_TABLET | ORAL | Status: DC

## 2012-05-17 MED ORDER — ROSUVASTATIN CALCIUM 5 MG PO TABS: ORAL_TABLET | ORAL | Status: DC

## 2012-05-17 MED ORDER — SODIUM CHLORIDE 0.9 % IV SOLN: 500.0000 mL | INTRAVENOUS | Status: DC

## 2012-05-17 NOTE — Patient Instructions (Addendum)
Impressions/recommendations:  Diverticulosis (handout given) High Fiber Diet (handout given)  No further colonoscopies are needed for screening purposes.  YOU HAD AN ENDOSCOPIC PROCEDURE TODAY AT THE [facility]: Refer to the procedure report that was given to you for any specific questions about what was found during the examination.  If the procedure report does not answer your questions, please call your gastroenterologist to clarify.  If you requested that your care partner not be given the details of your procedure findings, then the procedure report has been included in a sealed envelope for you to review at your convenience later.  YOU SHOULD EXPECT: Some feelings of bloating in the abdomen. Passage of more gas than usual.  Walking can help get rid of the air that was put into your GI tract during the procedure and reduce the bloating. If you had a lower endoscopy (such as a colonoscopy or flexible sigmoidoscopy) you may notice spotting of blood in your stool or on the toilet paper. If you underwent a bowel prep for your procedure, then you may not have a normal bowel movement for a few days.  DIET: Your first meal following the procedure should be a light meal and then it is ok to progress to your normal diet.  A half-sandwich or bowl of soup is an example of a good first meal.  Heavy or fried foods are harder to digest and may make you feel nauseous or bloated.  Likewise meals heavy in dairy and vegetables can cause extra gas to form and this can also increase the bloating.  Drink plenty of fluids but you should avoid alcoholic beverages for 24 hours.  ACTIVITY: Your care partner should take you home directly after the procedure.  You should plan to take it easy, moving slowly for the rest of the day.  You can resume normal activity the day after the procedure however you should NOT DRIVE or use heavy machinery for 24 hours (because of the sedation medicines used during the test).     SYMPTOMS TO REPORT IMMEDIATELY: A gastroenterologist can be reached at any hour.  During normal business hours, 8:30 AM to 5:00 PM Monday through Friday, call [phone].  After hours and on weekends, please call the GI answering service at [phone] who will take a message and have the physician on call contact you.   Following lower endoscopy (colonoscopy or flexible sigmoidoscopy):  Excessive amounts of blood in the stool  Significant tenderness or worsening of abdominal pains  Swelling of the abdomen that is new, acute  Fever of 100F or higher  Following upper endoscopy (EGD)  Vomiting of blood or coffee ground material  New chest pain or pain under the shoulder blades  Painful or persistently difficult swallowing  New shortness of breath  Fever of 100F or higher  Black, tarry-looking stools  FOLLOW UP: If any biopsies were taken you will be contacted by phone or by letter within the next 1-3 weeks.  Call your gastroenterologist if you have not heard about the biopsies in 3 weeks.  Our staff will call the home number listed on your records the next business day following your procedure to check on you and address any questions or concerns that you may have at that time regarding the information given to you following your procedure. This is a courtesy call and so if there is no answer at the home number and we have not heard from you through the emergency physician on call,  we will assume that you have returned to your regular daily activities without incident.  SIGNATURES/CONFIDENTIALITY: You and/or your care partner have signed paperwork which will be entered into your electronic medical record.  These signatures attest to the fact that that the information above on your After Visit Summary has been reviewed and is understood.  Full responsibility of the confidentiality of this discharge information lies with you and/or your care-partner.

## 2012-05-17 NOTE — Op Note (Signed)
[facility] [address] [place], [address]   COLONOSCOPY PROCEDURE REPORT  PATIENT: Kyle, Simon.  MR#: [id number] BIRTHDATE: 1933-01-15 , 79  yrs. old GENDER: Male ENDOSCOPIST: Rachael Fee, MD PROCEDURE DATE:  05/17/2012 PROCEDURE:   Colonoscopy, screening ASA CLASS:   Class III INDICATIONS:average risk screening. MEDICATIONS: Fentanyl 50 mcg IV, Versed 5 mg IV, and These medications were titrated to patient response per physician's verbal order  DESCRIPTION OF PROCEDURE:   After the risks benefits and alternatives of the procedure were thoroughly explained, informed consent was obtained.  A digital rectal exam revealed no abnormalities of the rectum.   The LB [id number]  endoscope was introduced through the anus and advanced to the cecum, which was identified by both the appendix and ileocecal valve. No adverse events experienced.   The quality of the prep was good, using MoviPrep  The instrument was then slowly withdrawn as the colon was fully examined.   COLON FINDINGS: There were multiple diverticulum in left colon.  The examination was otherwise normal.  Retroflexed views revealed no abnormalities. The time to cecum=5 minutes 04 seconds.  Withdrawal time=9 minutes 06 seconds.  The scope was withdrawn and the procedure completed. COMPLICATIONS: There were no complications.  ENDOSCOPIC IMPRESSION: There were multiple diverticulum in left colon. The examination was otherwise normal. No polyps or cancers  RECOMMENDATIONS: Given your age, you will not need another colonoscopy for colon cancer screening or polyp surveillance.  These types of tests usually stop around the age 80.   eSigned:  Rachael Fee, MD 05/17/2012 9:21 AM   cc: Pecola Lawless, MD

## 2012-05-17 NOTE — Progress Notes (Signed)
Patient did not experience any of the following events: a burn prior to discharge; a fall within the facility; wrong site/side/patient/procedure/implant event; or a hospital transfer or hospital admission upon discharge from the facility. (G8907) Patient did not have preoperative order for IV antibiotic SSI prophylaxis. (G8918)  

## 2012-05-17 NOTE — Telephone Encounter (Signed)
Rx sent electronically.  

## 2012-05-18 ENCOUNTER — Telehealth: Payer: Self-pay | Admitting: *Deleted

## 2012-05-18 NOTE — Telephone Encounter (Signed)
No answer, name identifier, message left to call if any questions or concerns.

## 2012-05-25 ENCOUNTER — Other Ambulatory Visit: Payer: Self-pay | Admitting: Orthopedic Surgery

## 2012-05-25 DIAGNOSIS — M25561 Pain in right knee: Secondary | ICD-10-CM

## 2012-05-30 ENCOUNTER — Ambulatory Visit
Admission: RE | Admit: 2012-05-30 | Discharge: 2012-05-30 | Disposition: A | Discharge status: Home / Self Care | Payer: Medicare Other | Source: Ambulatory Visit | Attending: Orthopedic Surgery | Admitting: Orthopedic Surgery

## 2012-05-30 DIAGNOSIS — M25561 Pain in right knee: Secondary | ICD-10-CM

## 2012-05-30 DIAGNOSIS — M25469 Effusion, unspecified knee: Secondary | ICD-10-CM | POA: Diagnosis not present

## 2012-05-30 DIAGNOSIS — IMO0002 Reserved for concepts with insufficient information to code with codable children: Secondary | ICD-10-CM | POA: Diagnosis not present

## 2012-05-30 DIAGNOSIS — M1710 Unilateral primary osteoarthritis, unspecified knee: Secondary | ICD-10-CM | POA: Diagnosis not present

## 2012-06-06 DIAGNOSIS — M7120 Synovial cyst of popliteal space [Baker], unspecified knee: Secondary | ICD-10-CM | POA: Diagnosis not present

## 2012-06-06 DIAGNOSIS — IMO0002 Reserved for concepts with insufficient information to code with codable children: Secondary | ICD-10-CM | POA: Diagnosis not present

## 2012-07-12 DIAGNOSIS — M9420 Chondromalacia, unspecified site: Secondary | ICD-10-CM | POA: Diagnosis not present

## 2012-07-12 DIAGNOSIS — Y999 Unspecified external cause status: Secondary | ICD-10-CM | POA: Diagnosis not present

## 2012-07-12 DIAGNOSIS — Y939 Activity, unspecified: Secondary | ICD-10-CM | POA: Diagnosis not present

## 2012-07-12 DIAGNOSIS — X58XXXA Exposure to other specified factors, initial encounter: Secondary | ICD-10-CM | POA: Diagnosis not present

## 2012-07-12 DIAGNOSIS — IMO0002 Reserved for concepts with insufficient information to code with codable children: Secondary | ICD-10-CM | POA: Diagnosis not present

## 2012-07-12 DIAGNOSIS — Y929 Unspecified place or not applicable: Secondary | ICD-10-CM | POA: Diagnosis not present

## 2012-07-21 ENCOUNTER — Other Ambulatory Visit: Payer: Self-pay | Admitting: Internal Medicine

## 2012-07-24 DIAGNOSIS — M25569 Pain in unspecified knee: Secondary | ICD-10-CM | POA: Diagnosis not present

## 2012-07-27 DIAGNOSIS — N138 Other obstructive and reflux uropathy: Secondary | ICD-10-CM | POA: Diagnosis not present

## 2012-07-27 DIAGNOSIS — N401 Enlarged prostate with lower urinary tract symptoms: Secondary | ICD-10-CM | POA: Diagnosis not present

## 2012-08-03 DIAGNOSIS — M25569 Pain in unspecified knee: Secondary | ICD-10-CM | POA: Diagnosis not present

## 2012-08-03 DIAGNOSIS — M1710 Unilateral primary osteoarthritis, unspecified knee: Secondary | ICD-10-CM | POA: Diagnosis not present

## 2012-08-03 DIAGNOSIS — M5137 Other intervertebral disc degeneration, lumbosacral region: Secondary | ICD-10-CM | POA: Diagnosis not present

## 2012-08-07 DIAGNOSIS — Z79899 Other long term (current) drug therapy: Secondary | ICD-10-CM | POA: Diagnosis not present

## 2012-08-13 ENCOUNTER — Other Ambulatory Visit: Payer: Self-pay | Admitting: Internal Medicine

## 2012-08-13 ENCOUNTER — Encounter: Payer: Self-pay | Admitting: Internal Medicine

## 2012-08-13 DIAGNOSIS — D649 Anemia, unspecified: Secondary | ICD-10-CM | POA: Insufficient documentation

## 2012-09-22 ENCOUNTER — Other Ambulatory Visit (INDEPENDENT_AMBULATORY_CARE_PROVIDER_SITE_OTHER): Payer: Medicare Other

## 2012-09-22 DIAGNOSIS — Z1289 Encounter for screening for malignant neoplasm of other sites: Secondary | ICD-10-CM | POA: Diagnosis not present

## 2012-09-22 DIAGNOSIS — Z0000 Encounter for general adult medical examination without abnormal findings: Secondary | ICD-10-CM | POA: Diagnosis not present

## 2012-09-22 LAB — HEMOCCULT GUIAC POC 1CARD (OFFICE)
Card #2 Fecal Occult Blod, POC: NEGATIVE
Card #3 Fecal Occult Blood, POC: NEGATIVE
Fecal Occult Blood, POC: NEGATIVE

## 2012-10-04 DIAGNOSIS — S335XXA Sprain of ligaments of lumbar spine, initial encounter: Secondary | ICD-10-CM | POA: Diagnosis not present

## 2012-10-04 DIAGNOSIS — M999 Biomechanical lesion, unspecified: Secondary | ICD-10-CM | POA: Diagnosis not present

## 2012-10-24 ENCOUNTER — Telehealth: Payer: Self-pay | Admitting: Internal Medicine

## 2012-10-24 NOTE — Telephone Encounter (Signed)
Patient is calling to request a refill on his test strips. He uses a [facility].

## 2012-10-25 MED ORDER — GLUCOSE BLOOD VI STRP: ORAL_STRIP | Status: DC

## 2012-10-25 NOTE — Telephone Encounter (Signed)
Rx sent to the pharmacy by e-script.//AB/CMA 

## 2012-12-13 ENCOUNTER — Other Ambulatory Visit: Payer: Self-pay | Admitting: Internal Medicine

## 2012-12-13 NOTE — Telephone Encounter (Signed)
Ranitidine refill sent to pharmacy 

## 2012-12-26 DIAGNOSIS — M999 Biomechanical lesion, unspecified: Secondary | ICD-10-CM | POA: Diagnosis not present

## 2012-12-26 DIAGNOSIS — S335XXA Sprain of ligaments of lumbar spine, initial encounter: Secondary | ICD-10-CM | POA: Diagnosis not present

## 2012-12-27 DIAGNOSIS — S335XXA Sprain of ligaments of lumbar spine, initial encounter: Secondary | ICD-10-CM | POA: Diagnosis not present

## 2012-12-27 DIAGNOSIS — M999 Biomechanical lesion, unspecified: Secondary | ICD-10-CM | POA: Diagnosis not present

## 2013-01-01 ENCOUNTER — Telehealth: Payer: Self-pay | Admitting: Internal Medicine

## 2013-01-01 NOTE — Telephone Encounter (Signed)
Patient is calling to inquire if he is due for a lab visit. Please advise.

## 2013-01-04 ENCOUNTER — Other Ambulatory Visit: Payer: Self-pay

## 2013-01-05 NOTE — Telephone Encounter (Signed)
Patient is calling back in regards to request below. Advised patient that there were lab orders that he was due to have in June still waiting to be collected. Patient scheduled an appointment to have these done next week but wants to know if a liver panel could be added to his labs. Please advise.

## 2013-01-05 NOTE — Telephone Encounter (Signed)
Please advise as to what labs to order.

## 2013-01-06 ENCOUNTER — Other Ambulatory Visit: Payer: Self-pay | Admitting: Internal Medicine

## 2013-01-06 DIAGNOSIS — D649 Anemia, unspecified: Secondary | ICD-10-CM

## 2013-01-06 DIAGNOSIS — E785 Hyperlipidemia, unspecified: Secondary | ICD-10-CM

## 2013-01-06 DIAGNOSIS — E119 Type 2 diabetes mellitus without complications: Secondary | ICD-10-CM

## 2013-01-06 DIAGNOSIS — E039 Hypothyroidism, unspecified: Secondary | ICD-10-CM

## 2013-01-09 ENCOUNTER — Other Ambulatory Visit (INDEPENDENT_AMBULATORY_CARE_PROVIDER_SITE_OTHER): Payer: Medicare Other

## 2013-01-09 DIAGNOSIS — E039 Hypothyroidism, unspecified: Secondary | ICD-10-CM

## 2013-01-09 DIAGNOSIS — E119 Type 2 diabetes mellitus without complications: Secondary | ICD-10-CM | POA: Diagnosis not present

## 2013-01-09 DIAGNOSIS — D649 Anemia, unspecified: Secondary | ICD-10-CM | POA: Diagnosis not present

## 2013-01-09 DIAGNOSIS — E785 Hyperlipidemia, unspecified: Secondary | ICD-10-CM | POA: Diagnosis not present

## 2013-01-09 LAB — CBC WITH DIFFERENTIAL/PLATELET
Basophils Absolute: 0 10*3/uL (ref 0.0–0.1)
Basophils Relative: 0.3 % (ref 0.0–3.0)
Eosinophils Absolute: 0.1 10*3/uL (ref 0.0–0.7)
Eosinophils Relative: 2.9 % (ref 0.0–5.0)
HCT: 37.3 % — ABNORMAL LOW (ref 39.0–52.0)
Hemoglobin: 12.4 g/dL — ABNORMAL LOW (ref 13.0–17.0)
Lymphocytes Relative: 31.6 % (ref 12.0–46.0)
Lymphs Abs: 1.3 10*3/uL (ref 0.7–4.0)
MCHC: 33.4 g/dL (ref 30.0–36.0)
MCV: 91.2 fl (ref 78.0–100.0)
Monocytes Absolute: 0.5 10*3/uL (ref 0.1–1.0)
Monocytes Relative: 11 % (ref 3.0–12.0)
Neutro Abs: 2.3 10*3/uL (ref 1.4–7.7)
Neutrophils Relative %: 54.2 % (ref 43.0–77.0)
Platelets: 227 10*3/uL (ref 150.0–400.0)
RBC: 4.09 Mil/uL — ABNORMAL LOW (ref 4.22–5.81)
RDW: 12.9 % (ref 11.5–14.6)
WBC: 4.2 10*3/uL — ABNORMAL LOW (ref 4.5–10.5)

## 2013-01-09 LAB — IBC PANEL
Iron: 77 ug/dL (ref 42–165)
Saturation Ratios: 18.4 % — ABNORMAL LOW (ref 20.0–50.0)
Transferrin: 298.4 mg/dL (ref 212.0–360.0)

## 2013-01-09 LAB — BASIC METABOLIC PANEL
BUN: 17 mg/dL (ref 6–23)
CO2: 27 mEq/L (ref 19–32)
Calcium: 9.1 mg/dL (ref 8.4–10.5)
Chloride: 99 mEq/L (ref 96–112)
Creatinine, Ser: 1 mg/dL (ref 0.4–1.5)
GFR: 79.58 mL/min (ref >60.00)
Glucose, Bld: 99 mg/dL (ref 70–99)
Potassium: 5.1 mEq/L (ref 3.5–5.1)
Sodium: 132 mEq/L — ABNORMAL LOW (ref 135–145)

## 2013-01-09 LAB — TSH: TSH: 0.59 u[IU]/mL (ref 0.35–5.50)

## 2013-01-09 LAB — LIPID PANEL
Cholesterol: 105 mg/dL (ref 0–200)
HDL: 39.2 mg/dL (ref >39.00)
LDL Cholesterol: 52 mg/dL (ref 0–99)
Total CHOL/HDL Ratio: 3
Triglycerides: 69 mg/dL (ref 0.0–149.0)
VLDL: 13.8 mg/dL (ref 0.0–40.0)

## 2013-01-09 LAB — HEPATIC FUNCTION PANEL
ALT: 18 U/L (ref 0–53)
AST: 22 U/L (ref 0–37)
Albumin: 3.9 g/dL (ref 3.5–5.2)
Alkaline Phosphatase: 63 U/L (ref 39–117)
Bilirubin, Direct: 0 mg/dL (ref 0.0–0.3)
Total Bilirubin: 0.8 mg/dL (ref 0.3–1.2)
Total Protein: 7 g/dL (ref 6.0–8.3)

## 2013-01-09 LAB — MICROALBUMIN / CREATININE URINE RATIO
Creatinine,U: 84.9 mg/dL
Microalb Creat Ratio: 0.2 mg/g (ref 0.0–30.0)
Microalb, Ur: 0.2 mg/dL (ref 0.0–1.9)

## 2013-01-09 LAB — HEMOGLOBIN A1C: Hgb A1c MFr Bld: 6.7 % — ABNORMAL HIGH (ref 4.6–6.5)

## 2013-01-09 LAB — VITAMIN B12: Vitamin B-12: 140 pg/mL — ABNORMAL LOW (ref 211–911)

## 2013-01-22 ENCOUNTER — Encounter: Payer: Self-pay | Admitting: Internal Medicine

## 2013-01-22 ENCOUNTER — Ambulatory Visit (INDEPENDENT_AMBULATORY_CARE_PROVIDER_SITE_OTHER): Payer: Medicare Other | Admitting: Internal Medicine

## 2013-01-22 VITALS — BP 133/85 | HR 54 | Temp 98.0°F | Resp 16 | Wt 195.0 lb

## 2013-01-22 DIAGNOSIS — E785 Hyperlipidemia, unspecified: Secondary | ICD-10-CM

## 2013-01-22 DIAGNOSIS — E119 Type 2 diabetes mellitus without complications: Secondary | ICD-10-CM

## 2013-01-22 DIAGNOSIS — E039 Hypothyroidism, unspecified: Secondary | ICD-10-CM | POA: Diagnosis not present

## 2013-01-22 DIAGNOSIS — D519 Vitamin B12 deficiency anemia, unspecified: Secondary | ICD-10-CM | POA: Insufficient documentation

## 2013-01-22 DIAGNOSIS — D518 Other vitamin B12 deficiency anemias: Secondary | ICD-10-CM

## 2013-01-22 DIAGNOSIS — K5730 Diverticulosis of large intestine without perforation or abscess without bleeding: Secondary | ICD-10-CM | POA: Insufficient documentation

## 2013-01-22 MED ORDER — LOSARTAN POTASSIUM 50 MG PO TABS: ORAL_TABLET | ORAL | Status: DC

## 2013-01-22 MED ORDER — GLUCOSE BLOOD VI STRP: ORAL_STRIP | Status: DC

## 2013-01-22 MED ORDER — ROSUVASTATIN CALCIUM 5 MG PO TABS: ORAL_TABLET | ORAL | Status: DC

## 2013-01-22 MED ORDER — CYANOCOBALAMIN 1000 MCG/ML IJ SOLN
1000.0000 ug | Freq: Once | INTRAMUSCULAR | Status: AC
  Administered 2013-01-22: 1000 ug via INTRAMUSCULAR

## 2013-01-22 NOTE — Patient Instructions (Signed)
Your next office appointment will be in 4 mos for labs

## 2013-01-22 NOTE — Assessment & Plan Note (Signed)
No change ; recheck 4 mos

## 2013-01-22 NOTE — Assessment & Plan Note (Addendum)
No change ; TSH 5 mos as it has decreased significantly

## 2013-01-22 NOTE — Assessment & Plan Note (Signed)
Decrease Crestor to 5 mg 1/2 qd Lipids, LFT in 4 mos

## 2013-01-22 NOTE — Progress Notes (Signed)
Subjective:    Patient ID: Kyle Simon, male    DOB: Jul 27, 1932, 79 y.o.   MRN: [id number]  HPI  Lab studies were reviewed.  He does exhibit anemia with hematocrit of 37.3. His B12 level is significantly low at 140. Serum iron level is normal. His A1c indicates good control at 6.7% which is stable. This will be compatible with an average glucose of 161 and 34% increase long term risk. Fasting glucose average approximately 110; 2 hours after any meal highest value is less than 120.  Lipids are at goal with an LDL of 52 and HDL of 39.2 on low-dose Crestor. Liver enzymes are also normal.  Colonoscopy  2014 : Tics. PMH of blood donations until TKR 2012. Transfusion @ that time. Review of Systems He denies epistaxis, hemoptysis, hematuria, melena, or rectal bleeding.He has no unexplained weight loss, dysphagia, or abdominal pain. He has no abnormal bruising or bleeding. He has no difficulty stopping bleeding with injury.  A low carb diet is followed; exercise encompasses 3  times per week as walking 18 holes without symptoms.  With DM cholesterol goal is less than 100 ; ideally < 70 . There is medication compliance with the low dose statin.  Low dose ASA taken Specifically denied are  chest pain, palpitations, dyspnea, or claudication.  Significant  memory deficit  or myalgias not present.     Objective:   Physical Exam Gen.: Healthy and well-nourished in appearance. Alert, appropriate and cooperative throughout exam.Appears younger than stated age  Head: Normocephalic without obvious abnormalities Eyes: No corneal or conjunctival inflammation noted. Pupils equal round reactive to light and accommodation. Extraocular motion intact.  Nose: External nasal exam reveals no deformity or inflammation. Nasal mucosa are pink and moist. No lesions or exudates noted.   Mouth: Oral mucosa and oropharynx reveal no lesions or exudates. Teeth in good repair. Neck: No deformities, masses, or tenderness  noted.  Thyroid normal. Lungs: Normal respiratory effort; chest expands symmetrically. Lungs are clear to auscultation without rales, wheezes, or increased work of breathing. Heart: Slow rate and regular rhythm. Normal S1 and S2. No gallop, click, or rub. No murmur. Abdomen: Bowel sounds normal; abdomen soft and nontender. No masses, organomegaly or hernias noted.                                   Musculoskeletal/extremities:  No clubbing, cyanosis, edema, or significant extremity  deformity noted. Range of motion normal .Tone & strength normal. 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 Fingernail  health good. Able to lie down & sit up w/o help.  Vascular: Carotid, radial artery, dorsalis pedis and  posterior tibial pulses are full and equal. No bruits present. Neurologic: Alert and oriented x3. Deep tendon reflexes symmetrical and normal.        Skin: Intact without suspicious lesions or rashes. Lymph: No cervical, axillary lymphadenopathy present. Psych: Mood and affect are normal. Normally interactive  Assessment & Plan:  See Current Assessment & Plan in Problem List under specific Diagnosis

## 2013-01-23 DIAGNOSIS — M545 Low back pain, unspecified: Secondary | ICD-10-CM | POA: Diagnosis not present

## 2013-01-23 DIAGNOSIS — M1710 Unilateral primary osteoarthritis, unspecified knee: Secondary | ICD-10-CM | POA: Diagnosis not present

## 2013-01-23 DIAGNOSIS — M5137 Other intervertebral disc degeneration, lumbosacral region: Secondary | ICD-10-CM | POA: Diagnosis not present

## 2013-01-23 DIAGNOSIS — M7120 Synovial cyst of popliteal space [Baker], unspecified knee: Secondary | ICD-10-CM | POA: Diagnosis not present

## 2013-01-23 MED ORDER — ROSUVASTATIN CALCIUM 5 MG PO TABS: ORAL_TABLET | ORAL | Status: DC

## 2013-01-23 NOTE — Assessment & Plan Note (Signed)
B12 weekly X 2 then monthly Recheck B12 in 6-8 mos

## 2013-01-29 ENCOUNTER — Ambulatory Visit: Payer: Medicare Other

## 2013-01-30 ENCOUNTER — Ambulatory Visit (INDEPENDENT_AMBULATORY_CARE_PROVIDER_SITE_OTHER): Payer: Medicare Other | Admitting: *Deleted

## 2013-01-30 DIAGNOSIS — D518 Other vitamin B12 deficiency anemias: Secondary | ICD-10-CM | POA: Diagnosis not present

## 2013-01-30 DIAGNOSIS — D519 Vitamin B12 deficiency anemia, unspecified: Secondary | ICD-10-CM

## 2013-01-30 MED ORDER — CYANOCOBALAMIN 1000 MCG/ML IJ SOLN
1000.0000 ug | Freq: Once | INTRAMUSCULAR | Status: AC
  Administered 2013-01-30: 1000 ug via INTRAMUSCULAR

## 2013-02-02 DIAGNOSIS — M810 Age-related osteoporosis without current pathological fracture: Secondary | ICD-10-CM | POA: Diagnosis not present

## 2013-02-05 ENCOUNTER — Telehealth: Payer: Self-pay | Admitting: Internal Medicine

## 2013-02-05 NOTE — Telephone Encounter (Signed)
Patient came in last week for his B-12 inj and states that he was supposed to receive a call last week as to when he could come in for his B12 inj this week. He is going to be out of town Wed-Fri so can only come today or tomorrow. No nurse schedule available. Please advise.

## 2013-02-09 NOTE — Telephone Encounter (Signed)
Left message on voice mail and sent pt a My Chart message regarding next B-12 being due on 03/02/13.

## 2013-02-25 ENCOUNTER — Other Ambulatory Visit: Payer: Self-pay | Admitting: Internal Medicine

## 2013-02-26 NOTE — Telephone Encounter (Signed)
Ranitidine refilled per protocol. JG//CMA 

## 2013-03-05 ENCOUNTER — Ambulatory Visit (INDEPENDENT_AMBULATORY_CARE_PROVIDER_SITE_OTHER): Payer: Medicare Other | Admitting: *Deleted

## 2013-03-05 DIAGNOSIS — E538 Deficiency of other specified B group vitamins: Secondary | ICD-10-CM | POA: Diagnosis not present

## 2013-03-05 DIAGNOSIS — M549 Dorsalgia, unspecified: Secondary | ICD-10-CM | POA: Diagnosis not present

## 2013-03-05 MED ORDER — CYANOCOBALAMIN 1000 MCG/ML IJ SOLN
1000.0000 ug | Freq: Once | INTRAMUSCULAR | Status: AC
  Administered 2013-03-05: 1000 ug via INTRAMUSCULAR

## 2013-03-06 DIAGNOSIS — M545 Low back pain, unspecified: Secondary | ICD-10-CM | POA: Diagnosis not present

## 2013-03-06 DIAGNOSIS — M19049 Primary osteoarthritis, unspecified hand: Secondary | ICD-10-CM | POA: Diagnosis not present

## 2013-03-06 DIAGNOSIS — M1710 Unilateral primary osteoarthritis, unspecified knee: Secondary | ICD-10-CM | POA: Diagnosis not present

## 2013-03-06 DIAGNOSIS — M546 Pain in thoracic spine: Secondary | ICD-10-CM | POA: Diagnosis not present

## 2013-03-07 DIAGNOSIS — M549 Dorsalgia, unspecified: Secondary | ICD-10-CM | POA: Diagnosis not present

## 2013-03-12 DIAGNOSIS — M549 Dorsalgia, unspecified: Secondary | ICD-10-CM | POA: Diagnosis not present

## 2013-03-15 DIAGNOSIS — M549 Dorsalgia, unspecified: Secondary | ICD-10-CM | POA: Diagnosis not present

## 2013-03-19 DIAGNOSIS — M549 Dorsalgia, unspecified: Secondary | ICD-10-CM | POA: Diagnosis not present

## 2013-03-22 DIAGNOSIS — M549 Dorsalgia, unspecified: Secondary | ICD-10-CM | POA: Diagnosis not present

## 2013-03-26 DIAGNOSIS — M549 Dorsalgia, unspecified: Secondary | ICD-10-CM | POA: Diagnosis not present

## 2013-03-29 DIAGNOSIS — M549 Dorsalgia, unspecified: Secondary | ICD-10-CM | POA: Diagnosis not present

## 2013-04-01 DIAGNOSIS — M549 Dorsalgia, unspecified: Secondary | ICD-10-CM | POA: Diagnosis not present

## 2013-04-03 DIAGNOSIS — H43399 Other vitreous opacities, unspecified eye: Secondary | ICD-10-CM | POA: Diagnosis not present

## 2013-04-03 DIAGNOSIS — H26499 Other secondary cataract, unspecified eye: Secondary | ICD-10-CM | POA: Diagnosis not present

## 2013-04-05 ENCOUNTER — Ambulatory Visit (INDEPENDENT_AMBULATORY_CARE_PROVIDER_SITE_OTHER): Payer: Medicare Other

## 2013-04-05 DIAGNOSIS — E538 Deficiency of other specified B group vitamins: Secondary | ICD-10-CM | POA: Diagnosis not present

## 2013-04-05 MED ORDER — CYANOCOBALAMIN 1000 MCG/ML IJ SOLN
1000.0000 ug | Freq: Once | INTRAMUSCULAR | Status: AC
  Administered 2013-04-05: 1000 ug via INTRAMUSCULAR

## 2013-04-12 ENCOUNTER — Ambulatory Visit (INDEPENDENT_AMBULATORY_CARE_PROVIDER_SITE_OTHER): Payer: Medicare Other | Admitting: Internal Medicine

## 2013-04-12 ENCOUNTER — Encounter: Payer: Self-pay | Admitting: Internal Medicine

## 2013-04-12 VITALS — BP 130/70 | HR 50 | Temp 97.5°F | Resp 13 | Ht 70.0 in | Wt 196.4 lb

## 2013-04-12 DIAGNOSIS — R197 Diarrhea, unspecified: Secondary | ICD-10-CM | POA: Diagnosis not present

## 2013-04-12 MED ORDER — DIPHENOXYLATE-ATROPINE 2.5-0.025 MG PO TABS: ORAL_TABLET | ORAL | Status: DC

## 2013-04-12 NOTE — Progress Notes (Signed)
   Subjective:    Patient ID: Kyle Simon, male    DOB: 10-Jan-1933, 80 y.o.   MRN: [id number]  HPI   His symptoms began 03/07/13 as loose stool followed by frankly watery stool with minimal debris. Over the next 48 hours he was having up to 2 watery bowel movements per day unresponsive to Imodium AD  He had knowledge of friends with diarrhea but did not have direct exposure to anyone who was ill in such a fashion  His dog does have heart worms.  He took penicillin for a week approximately 2 weeks ago following dental surgery  There's been no suspect food or liquid intake. He's had no travel recently.  He does have some cramping prior to the diarrhea.  He has no lightheadedness or presyncope. There's been no decreased urine output.     Review of Systems   He denies severe abdominal pain, unexplained weight loss, melena, or rectal bleeding.  He also denies fever, chills, or sweats.      Objective:   Physical Exam General appearance is one of good health and nourishment w/o distress.Appears younger than stated age  Eyes: No conjunctival inflammation or scleral icterus is present.Small pupils  Oral exam: Dental hygiene is good; lips and gums are healthy appearing.There is no oropharyngeal erythema or exudate noted.Punctate tatoo post pharynx  Heart:Slow rate and regular rhythm. S1 accentuated without gallop, murmur, click, rub or other extra sounds     Lungs:Chest clear to auscultation; no wheezes, rhonchi,rales ,or rubs present.No increased work of breathing.   Abdomen: bowel sounds normal, soft and non-tender without masses, organomegaly or hernias noted.  No guarding or rebound . No tenderness over the flanks to percussion  Musculoskeletal: Able to lie flat and sit up without help. Skin:Warm & dry.  Intact without suspicious lesions or rashes ; no jaundice or significant tenting  Lymphatic: No lymphadenopathy is noted about the head, neck, or  axilla               Assessment & Plan:  #1 diarrhea, he is clinically stable with no suggestion of an infectious etiology. Most likely cause would be the antibiotics taken for his dental work.  Plan: See orders.

## 2013-04-12 NOTE — Patient Instructions (Addendum)
Please take the probiotic , Align OR Florastor, every day until the bowels are normal. This will replace the normal bacteria which  are necessary for formation of normal stool and processing of food. Stay on clear liquids for 48-72 hours or until bowels are normal.This would include  jello, sherbert (NOT ice cream), Lipton's chicken noodle soup(NOT cream based soups),Gatorade Lite, flat Ginger ale (without High Fructose Corn Syrup),dry toast or crackers, baked potato.No milk , dairy or grease until bowels are formed.  Report increasing pain, fever or rectal bleeding Lomotil as needed for frank diarrhea   If diarrhea persists, stool studies would be indicated to check  for various bacterial infections  and ova & parasites.

## 2013-04-15 LAB — HM DIABETES EYE EXAM

## 2013-04-25 ENCOUNTER — Other Ambulatory Visit: Payer: Self-pay | Admitting: Internal Medicine

## 2013-04-25 ENCOUNTER — Encounter: Payer: Self-pay | Admitting: Family Medicine

## 2013-04-25 ENCOUNTER — Ambulatory Visit (INDEPENDENT_AMBULATORY_CARE_PROVIDER_SITE_OTHER): Payer: Medicare Other | Admitting: Family Medicine

## 2013-04-25 VITALS — BP 130/74 | HR 65 | Temp 97.6°F | Resp 16 | Wt 199.5 lb

## 2013-04-25 DIAGNOSIS — E785 Hyperlipidemia, unspecified: Secondary | ICD-10-CM | POA: Diagnosis not present

## 2013-04-25 DIAGNOSIS — E119 Type 2 diabetes mellitus without complications: Secondary | ICD-10-CM | POA: Diagnosis not present

## 2013-04-25 DIAGNOSIS — E039 Hypothyroidism, unspecified: Secondary | ICD-10-CM | POA: Diagnosis not present

## 2013-04-25 LAB — HEMOGLOBIN A1C: Hgb A1c MFr Bld: 6.6 % — ABNORMAL HIGH (ref 4.6–6.5)

## 2013-04-25 LAB — LIPID PANEL
Cholesterol: 123 mg/dL (ref 0–200)
HDL: 39.3 mg/dL (ref >39.00)
Total CHOL/HDL Ratio: 3
Triglycerides: 213 mg/dL — ABNORMAL HIGH (ref 0.0–149.0)
VLDL: 42.6 mg/dL — ABNORMAL HIGH (ref 0.0–40.0)

## 2013-04-25 LAB — HEPATIC FUNCTION PANEL
ALT: 22 U/L (ref 0–53)
AST: 21 U/L (ref 0–37)
Albumin: 3.9 g/dL (ref 3.5–5.2)
Alkaline Phosphatase: 70 U/L (ref 39–117)
Bilirubin, Direct: 0 mg/dL (ref 0.0–0.3)
Total Bilirubin: 0.6 mg/dL (ref 0.3–1.2)
Total Protein: 7.1 g/dL (ref 6.0–8.3)

## 2013-04-25 LAB — BASIC METABOLIC PANEL
BUN: 13 mg/dL (ref 6–23)
CO2: 28 mEq/L (ref 19–32)
Calcium: 9.2 mg/dL (ref 8.4–10.5)
Chloride: 97 mEq/L (ref 96–112)
Creatinine, Ser: 1 mg/dL (ref 0.4–1.5)
GFR: 80.48 mL/min (ref >60.00)
Glucose, Bld: 163 mg/dL — ABNORMAL HIGH (ref 70–99)
Potassium: 4.8 mEq/L (ref 3.5–5.1)
Sodium: 131 mEq/L — ABNORMAL LOW (ref 135–145)

## 2013-04-25 LAB — LDL CHOLESTEROL, DIRECT: Direct LDL: 61.6 mg/dL

## 2013-04-25 NOTE — Patient Instructions (Signed)
Follow up in 6 months to recheck sugar and cholesterol Keep up the good work!  You look great! We'll notify you of your lab results and make any changes if needed Focus on the low carb diet and regular exercise to control those sugars Call with any questions or concerns Welcome!!  We're glad to have you!!!

## 2013-04-25 NOTE — Progress Notes (Signed)
   Subjective:    Patient ID: Kyle Simon, male    DOB: Oct 14, 1932, 80 y.o.   MRN: [id number]  HPI New to establish w/ me.  Has being seeing [facility] for 25 yrs.  DM- ongoing problem for pt, has been able to control w/ healthy diet and regular exercise.  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  On ARB (1/2 tab every other day).  Fasting CBGs typically 98-110.  On statin.  UTD on eye exam (Feb 2015).  No CP, SOB, HAs, visual changes, edema.  Hyperlipidemia- chronic problem, on Crestor.  Denies abd pain, N/V, myalgias  Hypothyroid- chronic problem, on Levothyroxine daily 145mcg.  Last TSH in November, WNL.  No excessive fatigue, palpitations, N/V.   Review of Systems For ROS see HPI     Objective:   Physical Exam  Vitals reviewed. Constitutional: He is oriented to person, place, and time. He appears well-developed and well-nourished. No distress.  HENT:  Head: Normocephalic and atraumatic.  Eyes: Conjunctivae and EOM are normal. Pupils are equal, round, and reactive to light.  Neck: Normal range of motion. Neck supple. No thyromegaly present.  Cardiovascular: Normal rate, regular rhythm, normal heart sounds and intact distal pulses.   No murmur heard. Pulmonary/Chest: Effort normal and breath sounds normal. No respiratory distress.  Abdominal: Soft. Bowel sounds are normal. He exhibits no distension.  Musculoskeletal: He exhibits no edema.  Lymphadenopathy:    He has no cervical adenopathy.  Neurological: He is alert and oriented to person, place, and time. No cranial nerve deficit.  Skin: Skin is warm and dry.  Psychiatric: He has a normal mood and affect. His behavior is normal.          Assessment & Plan:

## 2013-04-25 NOTE — Assessment & Plan Note (Signed)
Chronic problem.  On ARB for renal protection.  UTD on eye exam.  On statin.  Asymptomatic.  CBGs well controlled by pt report.  Not currently on meds and doesn't want to be- attempting to control w/ healthy diet and regular exercise.  Check labs.  Start meds prn.

## 2013-04-25 NOTE — Assessment & Plan Note (Addendum)
Chronic problem.  Tolerating statin w/o difficulty.  Due for labs.  Adjust meds prn.

## 2013-04-25 NOTE — Assessment & Plan Note (Signed)
New to provider, ongoing for pt.  Recent TSH WNL.  Asymptomatic.  Will not repeat labs at this time.  Will follow at future visits.

## 2013-04-25 NOTE — Telephone Encounter (Signed)
LMOM (1:52pm) asking the pt to RTC regarding refill request for thyroid med.//AB/CMA

## 2013-05-01 NOTE — Telephone Encounter (Signed)
Rx sent to the pharmacy by e-script.//AB/CMA 

## 2013-05-09 ENCOUNTER — Ambulatory Visit (INDEPENDENT_AMBULATORY_CARE_PROVIDER_SITE_OTHER): Payer: Medicare Other

## 2013-05-09 DIAGNOSIS — E538 Deficiency of other specified B group vitamins: Secondary | ICD-10-CM

## 2013-05-09 MED ORDER — CYANOCOBALAMIN 1000 MCG/ML IJ SOLN
1000.0000 ug | Freq: Once | INTRAMUSCULAR | Status: AC
  Administered 2013-05-09: 1000 ug via INTRAMUSCULAR

## 2013-06-04 DIAGNOSIS — M545 Low back pain, unspecified: Secondary | ICD-10-CM | POA: Diagnosis not present

## 2013-06-04 DIAGNOSIS — M1710 Unilateral primary osteoarthritis, unspecified knee: Secondary | ICD-10-CM | POA: Diagnosis not present

## 2013-06-04 DIAGNOSIS — M949 Disorder of cartilage, unspecified: Secondary | ICD-10-CM | POA: Diagnosis not present

## 2013-06-04 DIAGNOSIS — M899 Disorder of bone, unspecified: Secondary | ICD-10-CM | POA: Diagnosis not present

## 2013-06-04 DIAGNOSIS — M546 Pain in thoracic spine: Secondary | ICD-10-CM | POA: Diagnosis not present

## 2013-06-08 ENCOUNTER — Ambulatory Visit (INDEPENDENT_AMBULATORY_CARE_PROVIDER_SITE_OTHER): Payer: Medicare Other | Admitting: *Deleted

## 2013-06-08 DIAGNOSIS — E538 Deficiency of other specified B group vitamins: Secondary | ICD-10-CM

## 2013-06-08 MED ORDER — CYANOCOBALAMIN 1000 MCG/ML IJ SOLN
1000.0000 ug | Freq: Once | INTRAMUSCULAR | Status: AC
  Administered 2013-06-08: 1000 ug via INTRAMUSCULAR

## 2013-06-11 DIAGNOSIS — M1710 Unilateral primary osteoarthritis, unspecified knee: Secondary | ICD-10-CM | POA: Diagnosis not present

## 2013-06-18 DIAGNOSIS — M546 Pain in thoracic spine: Secondary | ICD-10-CM | POA: Diagnosis not present

## 2013-06-19 ENCOUNTER — Other Ambulatory Visit: Payer: Medicare Other

## 2013-06-19 ENCOUNTER — Other Ambulatory Visit (INDEPENDENT_AMBULATORY_CARE_PROVIDER_SITE_OTHER): Payer: Medicare Other

## 2013-06-19 DIAGNOSIS — D518 Other vitamin B12 deficiency anemias: Secondary | ICD-10-CM

## 2013-06-19 DIAGNOSIS — M1710 Unilateral primary osteoarthritis, unspecified knee: Secondary | ICD-10-CM | POA: Diagnosis not present

## 2013-06-19 LAB — VITAMIN B12: Vitamin B-12: 349 pg/mL (ref 211–911)

## 2013-06-26 DIAGNOSIS — M1710 Unilateral primary osteoarthritis, unspecified knee: Secondary | ICD-10-CM | POA: Diagnosis not present

## 2013-07-03 DIAGNOSIS — M1710 Unilateral primary osteoarthritis, unspecified knee: Secondary | ICD-10-CM | POA: Diagnosis not present

## 2013-08-25 DIAGNOSIS — R6889 Other general symptoms and signs: Secondary | ICD-10-CM | POA: Diagnosis not present

## 2013-08-25 DIAGNOSIS — Z79899 Other long term (current) drug therapy: Secondary | ICD-10-CM | POA: Diagnosis not present

## 2013-08-25 DIAGNOSIS — R300 Dysuria: Secondary | ICD-10-CM | POA: Diagnosis not present

## 2013-08-25 LAB — BASIC METABOLIC PANEL
BUN: 14 mg/dL (ref 4–21)
Creatinine: 1 mg/dL (ref <=1.3)
Glucose: 120 mg/dL
Sodium: 134 mmol/L — AB (ref 137–147)

## 2013-08-25 LAB — HEMOGLOBIN A1C: Hgb A1c MFr Bld: 6.6 % — AB (ref 4.0–6.0)

## 2013-08-25 LAB — LIPID PANEL: Cholesterol: 125 mg/dL (ref 0–200)

## 2013-08-27 DIAGNOSIS — M545 Low back pain, unspecified: Secondary | ICD-10-CM | POA: Diagnosis not present

## 2013-08-28 ENCOUNTER — Encounter: Payer: Self-pay | Admitting: General Practice

## 2013-09-03 DIAGNOSIS — M76899 Other specified enthesopathies of unspecified lower limb, excluding foot: Secondary | ICD-10-CM | POA: Diagnosis not present

## 2013-09-17 DIAGNOSIS — Q762 Congenital spondylolisthesis: Secondary | ICD-10-CM | POA: Diagnosis not present

## 2013-09-17 DIAGNOSIS — M4310 Spondylolisthesis, site unspecified: Secondary | ICD-10-CM | POA: Diagnosis not present

## 2013-09-17 DIAGNOSIS — M545 Low back pain, unspecified: Secondary | ICD-10-CM | POA: Diagnosis not present

## 2013-09-17 DIAGNOSIS — Z6826 Body mass index (BMI) 26.0-26.9, adult: Secondary | ICD-10-CM | POA: Diagnosis not present

## 2013-09-20 DIAGNOSIS — M48061 Spinal stenosis, lumbar region without neurogenic claudication: Secondary | ICD-10-CM | POA: Diagnosis not present

## 2013-09-20 DIAGNOSIS — Q762 Congenital spondylolisthesis: Secondary | ICD-10-CM | POA: Diagnosis not present

## 2013-09-24 DIAGNOSIS — IMO0002 Reserved for concepts with insufficient information to code with codable children: Secondary | ICD-10-CM | POA: Diagnosis not present

## 2013-09-24 DIAGNOSIS — M7130 Other bursal cyst, unspecified site: Secondary | ICD-10-CM | POA: Diagnosis not present

## 2013-09-24 DIAGNOSIS — M5126 Other intervertebral disc displacement, lumbar region: Secondary | ICD-10-CM | POA: Diagnosis not present

## 2013-09-24 DIAGNOSIS — M4310 Spondylolisthesis, site unspecified: Secondary | ICD-10-CM | POA: Diagnosis not present

## 2013-09-25 ENCOUNTER — Other Ambulatory Visit: Payer: Self-pay | Admitting: Neurosurgery

## 2013-10-02 DIAGNOSIS — M5137 Other intervertebral disc degeneration, lumbosacral region: Secondary | ICD-10-CM | POA: Diagnosis not present

## 2013-10-09 ENCOUNTER — Encounter (HOSPITAL_COMMUNITY): Payer: Self-pay | Admitting: Pharmacy Technician

## 2013-10-11 ENCOUNTER — Encounter (HOSPITAL_COMMUNITY): Payer: Self-pay

## 2013-10-11 ENCOUNTER — Encounter (HOSPITAL_COMMUNITY)
Admission: RE | Admit: 2013-10-11 | Discharge: 2013-10-11 | Disposition: A | Discharge status: Home / Self Care | Payer: Medicare Other | Source: Ambulatory Visit | Attending: Neurosurgery | Admitting: Neurosurgery

## 2013-10-11 DIAGNOSIS — Z01810 Encounter for preprocedural cardiovascular examination: Secondary | ICD-10-CM | POA: Diagnosis not present

## 2013-10-11 DIAGNOSIS — Z01812 Encounter for preprocedural laboratory examination: Secondary | ICD-10-CM | POA: Insufficient documentation

## 2013-10-11 DIAGNOSIS — E119 Type 2 diabetes mellitus without complications: Secondary | ICD-10-CM | POA: Insufficient documentation

## 2013-10-11 DIAGNOSIS — M47817 Spondylosis without myelopathy or radiculopathy, lumbosacral region: Secondary | ICD-10-CM | POA: Diagnosis not present

## 2013-10-11 DIAGNOSIS — I498 Other specified cardiac arrhythmias: Secondary | ICD-10-CM | POA: Diagnosis not present

## 2013-10-11 DIAGNOSIS — Z01818 Encounter for other preprocedural examination: Secondary | ICD-10-CM | POA: Diagnosis not present

## 2013-10-11 HISTORY — DX: Nocturia: R35.1

## 2013-10-11 HISTORY — DX: Benign prostatic hyperplasia without lower urinary tract symptoms: N40.0

## 2013-10-11 HISTORY — DX: Drug induced constipation: K59.03

## 2013-10-11 LAB — BASIC METABOLIC PANEL
Anion gap: 13 (ref 5–15)
BUN: 17 mg/dL (ref 6–23)
CO2: 23 mEq/L (ref 19–32)
Calcium: 8.8 mg/dL (ref 8.4–10.5)
Chloride: 95 mEq/L — ABNORMAL LOW (ref 96–112)
Creatinine, Ser: 1.09 mg/dL (ref 0.50–1.35)
GFR calc Af Amer: 73 mL/min — ABNORMAL LOW (ref >90)
GFR calc non Af Amer: 63 mL/min — ABNORMAL LOW (ref >90)
Glucose, Bld: 149 mg/dL — ABNORMAL HIGH (ref 70–99)
Potassium: 5 mEq/L (ref 3.7–5.3)
Sodium: 131 mEq/L — ABNORMAL LOW (ref 137–147)

## 2013-10-11 LAB — CBC
HCT: 39.4 % (ref 39.0–52.0)
Hemoglobin: 13 g/dL (ref 13.0–17.0)
MCH: 30.4 pg (ref 26.0–34.0)
MCHC: 33 g/dL (ref 30.0–36.0)
MCV: 92.3 fL (ref 78.0–100.0)
Platelets: 216 10*3/uL (ref 150–400)
RBC: 4.27 MIL/uL (ref 4.22–5.81)
RDW: 13.2 % (ref 11.5–15.5)
WBC: 5.7 10*3/uL (ref 4.0–10.5)

## 2013-10-11 LAB — TYPE AND SCREEN
ABO/RH(D): O POS
Antibody Screen: NEGATIVE

## 2013-10-11 LAB — SURGICAL PCR SCREEN
MRSA, PCR: NEGATIVE
Staphylococcus aureus: POSITIVE — AB

## 2013-10-11 NOTE — Progress Notes (Signed)
Patient informed Nurse that he had a stress test and cardiac cath in 2003. No PCI. Patient denied having any current cardiac or pulmonary issues.

## 2013-10-11 NOTE — Pre-Procedure Instructions (Signed)
Kyle Simon  10/11/2013   Your procedure is scheduled on:  Tuesday October 23, 2013 at 12:09 PM.   Report to [facility] Admitting at 9:00 AM.  Call this number if you have problems the morning of surgery: [phone]   Remember:   Do not eat food or drink liquids after midnight.   Take these medicines the morning of surgery with A SIP OF WATER: Acetaminophen (Tylenol) if needed, Eye drops, Levothyroxine (Synthroid), and Tamsulosin (Flomax)  Discontinue aspirin and herbal medications 7 days prior to surgery (Ex. Fish oil and all vitamins)   Do not wear jewelry.  Do not wear lotions, powders, or cologne.   Men may shave face and neck.  Do not bring valuables to the hospital.  [facility] is not responsible for any belongings or valuables.               Contacts, dentures or bridgework may not be worn into surgery.  Leave suitcase in the car. After surgery it may be brought to your room.  For patients admitted to the hospital, discharge time is determined by your treatment team.               Patients discharged the day of surgery will not be allowed to drive home.  Name and phone number of your driver: Family/Friend  Special Instructions: Shower using CHG soap the night before and the morning of your surgery   Please read over the following fact sheets that you were given: Pain Booklet, Coughing and Deep Breathing, Blood Transfusion Information, MRSA Information and Surgical Site Infection Prevention

## 2013-10-11 NOTE — Progress Notes (Signed)
10/11/13 1312  OBSTRUCTIVE SLEEP APNEA  Have you ever been diagnosed with sleep apnea through a sleep study? No  Do you snore loudly (loud enough to be heard through closed doors)?  0  Do you often feel tired, fatigued, or sleepy during the daytime? 0  Has anyone observed you stop breathing during your sleep? 0  Do you have, or are you being treated for high blood pressure? 1  BMI more than 35 kg/m2? 0  Age over 78 years old? 1  Neck circumference greater than 40 cm/16 inches? 1  Gender: 1  Obstructive Sleep Apnea Score 4  Score 4 or greater  Results sent to PCP   This patient has screened at risk for sleep apnea using the STOP bang tool used during a pre-surgical visit. A score of 4 or greater is at risk for sleep apnea.

## 2013-10-11 NOTE — Progress Notes (Signed)
I called a prescription for Mupirocin ointment to [facility], [address]

## 2013-10-22 MED ORDER — CEFAZOLIN SODIUM-DEXTROSE 2-3 GM-% IV SOLR
2.0000 g | INTRAVENOUS | Status: AC
  Administered 2013-10-23: 2 g via INTRAVENOUS
  Filled 2013-10-22: qty 50

## 2013-10-23 ENCOUNTER — Inpatient Hospital Stay (HOSPITAL_COMMUNITY): Payer: Medicare Other

## 2013-10-23 ENCOUNTER — Encounter (HOSPITAL_COMMUNITY)
Admission: RE | Disposition: A | Discharge status: Home / Self Care | Payer: Self-pay | Source: Ambulatory Visit | Attending: Neurosurgery

## 2013-10-23 ENCOUNTER — Inpatient Hospital Stay (HOSPITAL_COMMUNITY): Payer: Medicare Other | Admitting: Anesthesiology

## 2013-10-23 ENCOUNTER — Encounter (HOSPITAL_COMMUNITY): Payer: Self-pay | Admitting: *Deleted

## 2013-10-23 ENCOUNTER — Encounter (HOSPITAL_COMMUNITY): Payer: Medicare Other | Admitting: Anesthesiology

## 2013-10-23 ENCOUNTER — Inpatient Hospital Stay (HOSPITAL_COMMUNITY)
Admission: RE | Admit: 2013-10-23 | Discharge: 2013-10-24 | DRG: 460 | Disposition: A | Discharge status: Home / Self Care | Payer: Medicare Other | Source: Ambulatory Visit | Attending: Neurosurgery | Admitting: Neurosurgery

## 2013-10-23 DIAGNOSIS — M4310 Spondylolisthesis, site unspecified: Secondary | ICD-10-CM | POA: Diagnosis not present

## 2013-10-23 DIAGNOSIS — Z981 Arthrodesis status: Secondary | ICD-10-CM

## 2013-10-23 DIAGNOSIS — M5126 Other intervertebral disc displacement, lumbar region: Secondary | ICD-10-CM | POA: Diagnosis present

## 2013-10-23 DIAGNOSIS — M545 Low back pain, unspecified: Secondary | ICD-10-CM | POA: Diagnosis not present

## 2013-10-23 DIAGNOSIS — M4316 Spondylolisthesis, lumbar region: Secondary | ICD-10-CM | POA: Diagnosis present

## 2013-10-23 DIAGNOSIS — Q762 Congenital spondylolisthesis: Secondary | ICD-10-CM | POA: Diagnosis not present

## 2013-10-23 DIAGNOSIS — M7130 Other bursal cyst, unspecified site: Secondary | ICD-10-CM | POA: Diagnosis present

## 2013-10-23 DIAGNOSIS — IMO0002 Reserved for concepts with insufficient information to code with codable children: Secondary | ICD-10-CM | POA: Diagnosis not present

## 2013-10-23 LAB — GLUCOSE, CAPILLARY
Glucose-Capillary: 98 mg/dL (ref 70–99)
Glucose-Capillary: 165 mg/dL — ABNORMAL HIGH (ref 70–99)
Glucose-Capillary: 145 mg/dL — ABNORMAL HIGH (ref 70–99)

## 2013-10-23 SURGERY — POSTERIOR LUMBAR FUSION 1 LEVEL
Anesthesia: General | Site: Back

## 2013-10-23 MED ORDER — PROMETHAZINE HCL 25 MG/ML IJ SOLN: 6.2500 mg | INTRAMUSCULAR | Status: DC | PRN

## 2013-10-23 MED ORDER — SUCCINYLCHOLINE CHLORIDE 20 MG/ML IJ SOLN
INTRAMUSCULAR | Status: AC
  Filled 2013-10-23: qty 1

## 2013-10-23 MED ORDER — DEXAMETHASONE SODIUM PHOSPHATE 4 MG/ML IJ SOLN
INTRAMUSCULAR | Status: AC
  Filled 2013-10-23: qty 1

## 2013-10-23 MED ORDER — STERILE WATER FOR INJECTION IJ SOLN
INTRAMUSCULAR | Status: AC
  Filled 2013-10-23: qty 10

## 2013-10-23 MED ORDER — DIAZEPAM 5 MG PO TABS
5.0000 mg | ORAL_TABLET | Freq: Four times a day (QID) | ORAL | Status: DC | PRN
Start: 2013-10-23 — End: 2013-10-24
  Administered 2013-10-23 – 2013-10-24 (×3): 5 mg via ORAL
  Filled 2013-10-23 (×2): qty 1

## 2013-10-23 MED ORDER — PANTOPRAZOLE SODIUM 40 MG IV SOLR
40.0000 mg | Freq: Every day | INTRAVENOUS | Status: DC
Start: 2013-10-23 — End: 2013-10-23
  Filled 2013-10-23: qty 40

## 2013-10-23 MED ORDER — FENTANYL CITRATE 0.05 MG/ML IJ SOLN
INTRAMUSCULAR | Status: DC | PRN
  Administered 2013-10-23 (×2): 50 ug via INTRAVENOUS
  Administered 2013-10-23: 100 ug via INTRAVENOUS
  Administered 2013-10-23: 50 ug via INTRAVENOUS

## 2013-10-23 MED ORDER — TRAMADOL HCL 50 MG PO TABS
50.0000 mg | ORAL_TABLET | Freq: Four times a day (QID) | ORAL | Status: DC | PRN
  Administered 2013-10-23: 50 mg via ORAL
  Filled 2013-10-23 (×2): qty 1

## 2013-10-23 MED ORDER — ACETAMINOPHEN 650 MG RE SUPP: 650.0000 mg | RECTAL | Status: DC | PRN

## 2013-10-23 MED ORDER — PROPOFOL 10 MG/ML IV BOLUS
INTRAVENOUS | Status: AC
  Filled 2013-10-23: qty 20

## 2013-10-23 MED ORDER — PHENOL 1.4 % MT LIQD: 1.0000 | OROMUCOSAL | Status: DC | PRN

## 2013-10-23 MED ORDER — CEFAZOLIN SODIUM 1-5 GM-% IV SOLN
1.0000 g | Freq: Three times a day (TID) | INTRAVENOUS | Status: AC
  Administered 2013-10-23 (×2): 1 g via INTRAVENOUS
  Filled 2013-10-23 (×3): qty 50

## 2013-10-23 MED ORDER — HYDROMORPHONE HCL PF 1 MG/ML IJ SOLN
INTRAMUSCULAR | Status: AC
  Filled 2013-10-23: qty 1

## 2013-10-23 MED ORDER — ROCURONIUM BROMIDE 50 MG/5ML IV SOLN
INTRAVENOUS | Status: AC
  Filled 2013-10-23: qty 1

## 2013-10-23 MED ORDER — BUPIVACAINE LIPOSOME 1.3 % IJ SUSP
20.0000 mL | INTRAMUSCULAR | Status: DC
  Filled 2013-10-23: qty 20

## 2013-10-23 MED ORDER — MENTHOL 3 MG MT LOZG
1.0000 | LOZENGE | OROMUCOSAL | Status: DC | PRN
  Filled 2013-10-23: qty 9

## 2013-10-23 MED ORDER — GLYCOPYRROLATE 0.2 MG/ML IJ SOLN
INTRAMUSCULAR | Status: AC
  Filled 2013-10-23: qty 1

## 2013-10-23 MED ORDER — HYDROMORPHONE HCL PF 1 MG/ML IJ SOLN
0.2500 mg | INTRAMUSCULAR | Status: DC | PRN
  Administered 2013-10-23 (×4): 0.5 mg via INTRAVENOUS

## 2013-10-23 MED ORDER — SODIUM CHLORIDE 0.9 % IJ SOLN
3.0000 mL | Freq: Two times a day (BID) | INTRAMUSCULAR | Status: DC
  Administered 2013-10-23: 3 mL via INTRAVENOUS

## 2013-10-23 MED ORDER — EPHEDRINE SULFATE 50 MG/ML IJ SOLN
INTRAMUSCULAR | Status: AC
  Filled 2013-10-23: qty 1

## 2013-10-23 MED ORDER — SCOPOLAMINE 1 MG/3DAYS TD PT72
MEDICATED_PATCH | TRANSDERMAL | Status: AC
  Administered 2013-10-23: 1 via TRANSDERMAL
  Filled 2013-10-23: qty 1

## 2013-10-23 MED ORDER — DEXAMETHASONE SODIUM PHOSPHATE 4 MG/ML IJ SOLN
INTRAMUSCULAR | Status: DC | PRN
  Administered 2013-10-23: 4 mg via INTRAVENOUS

## 2013-10-23 MED ORDER — ONDANSETRON HCL 4 MG/2ML IJ SOLN: 4.0000 mg | INTRAMUSCULAR | Status: DC | PRN

## 2013-10-23 MED ORDER — MORPHINE SULFATE 2 MG/ML IJ SOLN: 1.0000 mg | INTRAMUSCULAR | Status: DC | PRN

## 2013-10-23 MED ORDER — BUPIVACAINE HCL (PF) 0.5 % IJ SOLN
INTRAMUSCULAR | Status: DC | PRN
  Administered 2013-10-23: 5 mL

## 2013-10-23 MED ORDER — GLYCOPYRROLATE 0.2 MG/ML IJ SOLN
INTRAMUSCULAR | Status: DC | PRN
  Administered 2013-10-23 (×2): 0.1 mg via INTRAVENOUS

## 2013-10-23 MED ORDER — ALUM & MAG HYDROXIDE-SIMETH 200-200-20 MG/5ML PO SUSP: 30.0000 mL | Freq: Four times a day (QID) | ORAL | Status: DC | PRN

## 2013-10-23 MED ORDER — ATORVASTATIN CALCIUM 10 MG PO TABS
10.0000 mg | ORAL_TABLET | Freq: Every day | ORAL | Status: DC
  Administered 2013-10-23: 10 mg via ORAL
  Filled 2013-10-23 (×2): qty 1

## 2013-10-23 MED ORDER — PROPOFOL 10 MG/ML IV BOLUS
INTRAVENOUS | Status: DC | PRN
  Administered 2013-10-23: 140 mg via INTRAVENOUS
  Administered 2013-10-23: 40 mg via INTRAVENOUS

## 2013-10-23 MED ORDER — POLYETHYLENE GLYCOL 3350 17 G PO PACK
17.0000 g | PACK | Freq: Every day | ORAL | Status: DC | PRN
  Filled 2013-10-23: qty 1

## 2013-10-23 MED ORDER — LEVOTHYROXINE SODIUM 150 MCG PO TABS
150.0000 ug | ORAL_TABLET | Freq: Every day | ORAL | Status: DC
  Administered 2013-10-23 – 2013-10-24 (×2): 150 ug via ORAL
  Filled 2013-10-23 (×4): qty 1

## 2013-10-23 MED ORDER — ARTIFICIAL TEARS OP OINT
TOPICAL_OINTMENT | OPHTHALMIC | Status: DC | PRN
  Administered 2013-10-23: 1 via OPHTHALMIC

## 2013-10-23 MED ORDER — BISACODYL 10 MG RE SUPP: 10.0000 mg | Freq: Every day | RECTAL | Status: DC | PRN

## 2013-10-23 MED ORDER — ONDANSETRON HCL 4 MG/2ML IJ SOLN
INTRAMUSCULAR | Status: DC | PRN
  Administered 2013-10-23: 4 mg via INTRAVENOUS

## 2013-10-23 MED ORDER — ADULT MULTIVITAMIN W/MINERALS CH
1.0000 | ORAL_TABLET | Freq: Every day | ORAL | Status: DC
  Administered 2013-10-23: 1 via ORAL
  Filled 2013-10-23 (×2): qty 1

## 2013-10-23 MED ORDER — MIDAZOLAM HCL 5 MG/5ML IJ SOLN
INTRAMUSCULAR | Status: DC | PRN
  Administered 2013-10-23 (×2): 1 mg via INTRAVENOUS

## 2013-10-23 MED ORDER — ARTIFICIAL TEARS OP OINT
TOPICAL_OINTMENT | OPHTHALMIC | Status: AC
  Filled 2013-10-23: qty 3.5

## 2013-10-23 MED ORDER — EPHEDRINE SULFATE 50 MG/ML IJ SOLN
INTRAMUSCULAR | Status: AC
Start: 2013-10-23 — End: 2013-10-23
  Filled 2013-10-23: qty 1

## 2013-10-23 MED ORDER — FLEET ENEMA 7-19 GM/118ML RE ENEM
1.0000 | ENEMA | Freq: Once | RECTAL | Status: AC | PRN
  Filled 2013-10-23: qty 1

## 2013-10-23 MED ORDER — ACETAMINOPHEN 500 MG PO TABS: 500.0000 mg | ORAL_TABLET | Freq: Three times a day (TID) | ORAL | Status: DC | PRN

## 2013-10-23 MED ORDER — LACTATED RINGERS IV SOLN
INTRAVENOUS | Status: DC | PRN
  Administered 2013-10-23: 09:00:00 via INTRAVENOUS

## 2013-10-23 MED ORDER — ASPIRIN EC 81 MG PO TBEC
81.0000 mg | DELAYED_RELEASE_TABLET | Freq: Every day | ORAL | Status: DC
  Administered 2013-10-23: 81 mg via ORAL
  Filled 2013-10-23 (×2): qty 1

## 2013-10-23 MED ORDER — LOSARTAN POTASSIUM 25 MG PO TABS
25.0000 mg | ORAL_TABLET | ORAL | Status: DC
  Filled 2013-10-23: qty 1

## 2013-10-23 MED ORDER — PROPOFOL 10 MG/ML IV EMUL
INTRAVENOUS | Status: AC
  Filled 2013-10-23: qty 100

## 2013-10-23 MED ORDER — KCL IN DEXTROSE-NACL 20-5-0.45 MEQ/L-%-% IV SOLN
INTRAVENOUS | Status: DC
  Filled 2013-10-23 (×3): qty 1000

## 2013-10-23 MED ORDER — EPHEDRINE SULFATE 50 MG/ML IJ SOLN
INTRAMUSCULAR | Status: DC | PRN
  Administered 2013-10-23 (×3): 10 mg via INTRAVENOUS

## 2013-10-23 MED ORDER — THROMBIN 20000 UNITS EX SOLR
CUTANEOUS | Status: DC | PRN
  Administered 2013-10-23: 11:00:00 via TOPICAL

## 2013-10-23 MED ORDER — SUCCINYLCHOLINE CHLORIDE 20 MG/ML IJ SOLN
INTRAMUSCULAR | Status: DC | PRN
  Administered 2013-10-23: 120 mg via INTRAVENOUS

## 2013-10-23 MED ORDER — TRAMADOL HCL 50 MG PO TABS
50.0000 mg | ORAL_TABLET | Freq: Four times a day (QID) | ORAL | Status: DC | PRN
  Administered 2013-10-23 – 2013-10-24 (×3): 100 mg via ORAL
  Filled 2013-10-23 (×3): qty 2

## 2013-10-23 MED ORDER — PANTOPRAZOLE SODIUM 40 MG PO TBEC
40.0000 mg | DELAYED_RELEASE_TABLET | Freq: Every day | ORAL | Status: DC
  Administered 2013-10-23: 40 mg via ORAL

## 2013-10-23 MED ORDER — HYDROCODONE-ACETAMINOPHEN 5-325 MG PO TABS: 1.0000 | ORAL_TABLET | ORAL | Status: DC | PRN

## 2013-10-23 MED ORDER — DIAZEPAM 5 MG PO TABS
ORAL_TABLET | ORAL | Status: AC
  Filled 2013-10-23: qty 1

## 2013-10-23 MED ORDER — FENTANYL CITRATE 0.05 MG/ML IJ SOLN
INTRAMUSCULAR | Status: AC
  Filled 2013-10-23: qty 5

## 2013-10-23 MED ORDER — ARTIFICIAL TEARS OP OINT: TOPICAL_OINTMENT | OPHTHALMIC | Status: DC | PRN

## 2013-10-23 MED ORDER — LIDOCAINE-EPINEPHRINE 1 %-1:100000 IJ SOLN
INTRAMUSCULAR | Status: DC | PRN
  Administered 2013-10-23: 5 mL via INTRADERMAL

## 2013-10-23 MED ORDER — SENNA 8.6 MG PO TABS
1.0000 | ORAL_TABLET | Freq: Two times a day (BID) | ORAL | Status: DC
Start: 2013-10-23 — End: 2013-10-24
  Administered 2013-10-23: 8.6 mg via ORAL
  Filled 2013-10-23 (×4): qty 1

## 2013-10-23 MED ORDER — TAMSULOSIN HCL 0.4 MG PO CAPS
0.4000 mg | ORAL_CAPSULE | Freq: Two times a day (BID) | ORAL | Status: DC
  Administered 2013-10-23: 0.4 mg via ORAL
  Filled 2013-10-23 (×3): qty 1

## 2013-10-23 MED ORDER — LIDOCAINE HCL (CARDIAC) 20 MG/ML IV SOLN
INTRAVENOUS | Status: DC | PRN
  Administered 2013-10-23: 100 mg via INTRAVENOUS

## 2013-10-23 MED ORDER — ACETAMINOPHEN 325 MG PO TABS
650.0000 mg | ORAL_TABLET | ORAL | Status: DC | PRN
  Administered 2013-10-23 (×2): 650 mg via ORAL
  Filled 2013-10-23 (×2): qty 2

## 2013-10-23 MED ORDER — SODIUM CHLORIDE 0.9 % IV SOLN: 250.0000 mL | INTRAVENOUS | Status: DC

## 2013-10-23 MED ORDER — ZOLPIDEM TARTRATE 5 MG PO TABS
5.0000 mg | ORAL_TABLET | Freq: Every evening | ORAL | Status: DC | PRN
Start: 2013-10-23 — End: 2013-10-24

## 2013-10-23 MED ORDER — LIDOCAINE HCL (CARDIAC) 20 MG/ML IV SOLN
INTRAVENOUS | Status: AC
  Filled 2013-10-23: qty 5

## 2013-10-23 MED ORDER — PHENYLEPHRINE HCL 10 MG/ML IJ SOLN
10.0000 mg | INTRAVENOUS | Status: DC | PRN
  Administered 2013-10-23: 15 ug/min via INTRAVENOUS

## 2013-10-23 MED ORDER — PROPOFOL INFUSION 10 MG/ML OPTIME
INTRAVENOUS | Status: DC | PRN
  Administered 2013-10-23: 25 ug/kg/min via INTRAVENOUS

## 2013-10-23 MED ORDER — DOCUSATE SODIUM 100 MG PO CAPS
100.0000 mg | ORAL_CAPSULE | Freq: Two times a day (BID) | ORAL | Status: DC
  Administered 2013-10-23: 100 mg via ORAL
  Filled 2013-10-23 (×3): qty 1

## 2013-10-23 MED ORDER — MIDAZOLAM HCL 2 MG/2ML IJ SOLN
INTRAMUSCULAR | Status: AC
Start: 2013-10-23 — End: 2013-10-23
  Filled 2013-10-23: qty 2

## 2013-10-23 MED ORDER — VITAMIN B-12 100 MCG PO TABS
100.0000 ug | ORAL_TABLET | Freq: Every day | ORAL | Status: DC
  Administered 2013-10-23: 100 ug via ORAL
  Filled 2013-10-23 (×2): qty 1

## 2013-10-23 MED ORDER — 0.9 % SODIUM CHLORIDE (POUR BTL) OPTIME
TOPICAL | Status: DC | PRN
  Administered 2013-10-23: 1000 mL

## 2013-10-23 MED ORDER — LACTATED RINGERS IV SOLN
INTRAVENOUS | Status: DC | PRN
  Administered 2013-10-23 (×2): via INTRAVENOUS

## 2013-10-23 MED ORDER — BUPIVACAINE LIPOSOME 1.3 % IJ SUSP
INTRAMUSCULAR | Status: DC | PRN
  Administered 2013-10-23: 20 mL

## 2013-10-23 MED ORDER — SODIUM CHLORIDE 0.9 % IJ SOLN: 3.0000 mL | INTRAMUSCULAR | Status: DC | PRN

## 2013-10-23 SURGICAL SUPPLY — 86 items
BAG DECANTER FOR FLEXI CONT (MISCELLANEOUS) ×2 IMPLANT
BENZOIN TINCTURE PRP APPL 2/3 (GAUZE/BANDAGES/DRESSINGS) ×2 IMPLANT
BLADE SURG 10 STRL SS (BLADE) ×2 IMPLANT
BLADE SURG ROTATE 9660 (MISCELLANEOUS) IMPLANT
BONE MATRIX OSTEOCEL PRO MED (Bone Implant) ×2 IMPLANT
BUR MATCHSTICK NEURO 3.0 LAGG (BURR) ×2 IMPLANT
BUR PRECISION FLUTE 5.0 (BURR) ×2 IMPLANT
CAGE COROENT LG 10X9X23-12 (Cage) ×4 IMPLANT
CANISTER SUCT 3000ML (MISCELLANEOUS) ×2 IMPLANT
CLIP NEUROVISION LG (CLIP) ×2 IMPLANT
CONT SPEC 4OZ CLIKSEAL STRL BL (MISCELLANEOUS) ×4 IMPLANT
COVER BACK TABLE 24X17X13 BIG (DRAPES) IMPLANT
COVER TABLE BACK 60X90 (DRAPES) ×2 IMPLANT
DERMABOND ADVANCED (GAUZE/BANDAGES/DRESSINGS) ×1
DERMABOND ADVANCED .7 DNX12 (GAUZE/BANDAGES/DRESSINGS) ×1 IMPLANT
DRAPE C-ARM 42X72 X-RAY (DRAPES) ×2 IMPLANT
DRAPE C-ARMOR (DRAPES) ×2 IMPLANT
DRAPE LAPAROTOMY 100X72X124 (DRAPES) ×2 IMPLANT
DRAPE POUCH INSTRU U-SHP 10X18 (DRAPES) ×2 IMPLANT
DRAPE SURG 17X23 STRL (DRAPES) ×2 IMPLANT
DRSG OPSITE POSTOP 4X6 (GAUZE/BANDAGES/DRESSINGS) ×2 IMPLANT
DRSG TELFA 3X8 NADH (GAUZE/BANDAGES/DRESSINGS) ×2 IMPLANT
DURAPREP 26ML APPLICATOR (WOUND CARE) ×2 IMPLANT
ELECT BLADE 4.0 EZ CLEAN MEGAD (MISCELLANEOUS) ×2
ELECT REM PT RETURN 9FT ADLT (ELECTROSURGICAL) ×2
ELECTRODE BLDE 4.0 EZ CLN MEGD (MISCELLANEOUS) ×1 IMPLANT
ELECTRODE REM PT RTRN 9FT ADLT (ELECTROSURGICAL) ×1 IMPLANT
EVACUATOR 1/8 PVC DRAIN (DRAIN) ×2 IMPLANT
GAUZE SPONGE 4X4 12PLY STRL (GAUZE/BANDAGES/DRESSINGS) ×2 IMPLANT
GAUZE SPONGE 4X4 16PLY XRAY LF (GAUZE/BANDAGES/DRESSINGS) IMPLANT
GLOVE BIO SURGEON STRL SZ8 (GLOVE) ×4 IMPLANT
GLOVE BIOGEL PI IND STRL 8 (GLOVE) ×3 IMPLANT
GLOVE BIOGEL PI IND STRL 8.5 (GLOVE) ×2 IMPLANT
GLOVE BIOGEL PI INDICATOR 8 (GLOVE) ×3
GLOVE BIOGEL PI INDICATOR 8.5 (GLOVE) ×2
GLOVE ECLIPSE 7.5 STRL STRAW (GLOVE) ×4 IMPLANT
GLOVE ECLIPSE 8.0 STRL XLNG CF (GLOVE) ×4 IMPLANT
GLOVE EXAM NITRILE LRG STRL (GLOVE) IMPLANT
GLOVE EXAM NITRILE MD LF STRL (GLOVE) ×2 IMPLANT
GLOVE EXAM NITRILE XL STR (GLOVE) IMPLANT
GLOVE EXAM NITRILE XS STR PU (GLOVE) IMPLANT
GOWN STRL REUS W/ TWL LRG LVL3 (GOWN DISPOSABLE) ×1 IMPLANT
GOWN STRL REUS W/ TWL XL LVL3 (GOWN DISPOSABLE) ×1 IMPLANT
GOWN STRL REUS W/TWL 2XL LVL3 (GOWN DISPOSABLE) ×4 IMPLANT
GOWN STRL REUS W/TWL LRG LVL3 (GOWN DISPOSABLE) ×1
GOWN STRL REUS W/TWL XL LVL3 (GOWN DISPOSABLE) ×1
KIT BASIN OR (CUSTOM PROCEDURE TRAY) ×2 IMPLANT
KIT NEEDLE NVM5 EMG ELECT (KITS) ×1 IMPLANT
KIT NEEDLE NVM5 EMG ELECTRODE (KITS) ×1
KIT POSITION SURG JACKSON T1 (MISCELLANEOUS) ×2 IMPLANT
KIT ROOM TURNOVER OR (KITS) ×2 IMPLANT
MILL MEDIUM DISP (BLADE) ×2 IMPLANT
NEEDLE HYPO 21X1 ECLIPSE (NEEDLE) ×2 IMPLANT
NEEDLE HYPO 25X1 1.5 SAFETY (NEEDLE) ×2 IMPLANT
NEEDLE SPNL 18GX3.5 QUINCKE PK (NEEDLE) IMPLANT
NS IRRIG 1000ML POUR BTL (IV SOLUTION) ×2 IMPLANT
PACK LAMINECTOMY NEURO (CUSTOM PROCEDURE TRAY) ×2 IMPLANT
PAD ARMBOARD 7.5X6 YLW CONV (MISCELLANEOUS) ×6 IMPLANT
PATTIES SURGICAL .5 X.5 (GAUZE/BANDAGES/DRESSINGS) IMPLANT
PATTIES SURGICAL .5 X1 (DISPOSABLE) IMPLANT
PATTIES SURGICAL 1X1 (DISPOSABLE) IMPLANT
ROD 25MM SPINAL (Rod) ×2 IMPLANT
ROD 30MM (Rod) ×1 IMPLANT
ROD PLIF MAS PB SPHERX 30 (Rod) ×1 IMPLANT
SCREW LOCK (Screw) ×4 IMPLANT
SCREW LOCK FXNS SPNE MAS PL (Screw) ×4 IMPLANT
SCREW PLIF MAS 5.5X35 LUMBAR (Screw) ×4 IMPLANT
SCREW SHANKS 5.5X35 (Screw) ×4 IMPLANT
SCREW TULIP 5.5 (Screw) ×4 IMPLANT
SPONGE LAP 4X18 X RAY DECT (DISPOSABLE) IMPLANT
SPONGE SURGIFOAM ABS GEL 100 (HEMOSTASIS) ×2 IMPLANT
STAPLER SKIN PROX WIDE 3.9 (STAPLE) IMPLANT
STRIP CLOSURE SKIN 1/2X4 (GAUZE/BANDAGES/DRESSINGS) ×2 IMPLANT
SUT VIC AB 1 CT1 18XBRD ANBCTR (SUTURE) ×2 IMPLANT
SUT VIC AB 1 CT1 8-18 (SUTURE) ×2
SUT VIC AB 2-0 CT1 18 (SUTURE) ×4 IMPLANT
SUT VIC AB 3-0 SH 8-18 (SUTURE) ×4 IMPLANT
SYR 20CC LL (SYRINGE) ×2 IMPLANT
SYR 20ML ECCENTRIC (SYRINGE) ×2 IMPLANT
SYR 3ML LL SCALE MARK (SYRINGE) ×4 IMPLANT
SYR 5ML LL (SYRINGE) IMPLANT
TOWEL OR 17X24 6PK STRL BLUE (TOWEL DISPOSABLE) ×2 IMPLANT
TOWEL OR 17X26 10 PK STRL BLUE (TOWEL DISPOSABLE) ×2 IMPLANT
TRAP SPECIMEN MUCOUS 40CC (MISCELLANEOUS) ×2 IMPLANT
TRAY FOLEY CATH 14FRSI W/METER (CATHETERS) ×2 IMPLANT
WATER STERILE IRR 1000ML POUR (IV SOLUTION) ×2 IMPLANT

## 2013-10-23 NOTE — Brief Op Note (Signed)
10/23/2013  11:15 AM  PATIENT:  Kyle Simon  80 y.o. 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, Synovial Cyst, Radiculopathy L 45   POST-OPERATIVE DIAGNOSIS:  Spondylolisthesis, Lumbar herniated nucleus pulposus without myelopathy, Synovial Cyst, Radiculopathy L 45  PROCEDURE:  Procedure(s) with comments: Lumbar Four-Five Resection of synovial cyst with posterior lumbar interbody fusion (N/A) - Lumbar Four-Five Resection of synovial cyst with posterior lumbar interbody fusion with PEEK interbody cages, pedicle screw fixation, posterolateral arthrodesis with autograft, allograft  Decompression greater than standard PLIF procedure  SURGEON:  Surgeon(s) and Role:    * Erline Levine, MD - Primary    * Ashok Pall, MD - Assisting  PHYSICIAN ASSISTANT:   ASSISTANTS: Poteat, RN   ANESTHESIA:   general  EBL:  Total I/O In: 1600 [I.V.:1600] Out: 7322 [Urine:1200; Blood:150]  BLOOD ADMINISTERED:none  DRAINS: none   LOCAL MEDICATIONS USED:  LIDOCAINE   SPECIMEN:  No Specimen  DISPOSITION OF SPECIMEN:  N/A  COUNTS:  YES  TOURNIQUET:  * No tourniquets in log *  DICTATION: Patient is a 80 year old with recurrent synovial cyst, spondylolisthesis, disc herniation and severe back and left lower extremity pain at L4/5 levels of the lumbar spine. It was elected to take her to surgery for MASPLIF L 45 level with posterolateral arthrodesis.  Procedure:   Following uncomplicated induction of GETA, and placement of electrodes for neural monitoring, patient was turned into a prone position on the [place] table and using AP  fluoroscopy the area of planned incision was marked, prepped with betadine scrub and Duraprep, then draped. Exposure was performed of facet joint complex at L 45 level and the MAS retractor was placed.5.5 x 35 mm cortical Nuvasive screws were placed at L 4 bilaterally according to standard landmarks using  neural monitoring.  A total laminectomy of L 4 was then performed with disarticulation of facets.  This bone was saved for grafting, combined with Osteocel after being run through bone mill and was placed in bone packing device.  Thorough discectomy was performed bilaterally at L 45 and the endplates were prepared for grafting.  Redo laminectomy was performed with removal of synovial cyst with careful removal of compressive cyst and decompression of both L 4 and L 5 nerve roots. 23 x 10 x 12 degree cages were placed in the interspace and positioning was confirmed with AP and lateral fluoroscopy.  10 cc of autograft/Osteocel was packed in the interspace medial to the second cage.   Remaining screws were placed at L 5 and 30 and 25 mm rods were placed.   And the screws were locked and torqued.Final Xrays showed well positioned implants and screw fixation. The posterolateral region was packed with remaining 10 cc of autograft on the right of midline. The wounds were irrigated and then closed with 1, 2-0 and 3-0 Vicryl stitches. 20 cc long-acting Marcaine was infiltrated in the subcutaneous tissues. Sterile occlusive dressing was placed with Dermabond and honeycomb dressing. The patient was then extubated in the operating room and taken to recovery in stable and satisfactory condition having tolerated her operation well. Counts were correct at the end of the case.  PLAN OF CARE: Admit to inpatient   PATIENT DISPOSITION:  PACU - hemodynamically stable.   Delay start of Pharmacological VTE agent (>24hrs) due to surgical blood loss or risk of bleeding: yes

## 2013-10-23 NOTE — Anesthesia Preprocedure Evaluation (Signed)
Anesthesia Evaluation  Patient identified by MRN, date of birth, ID band Patient awake    Reviewed: Allergy & Precautions, H&P , NPO status , Patient's Chart, lab work & pertinent test results  History of Anesthesia Complications Negative for: history of anesthetic complications  Airway Mallampati: I TM Distance: >3 FB Neck ROM: Full    Dental  (+) Teeth Intact, Implants, Caps, Dental Advisory Given   Pulmonary former smoker,    Pulmonary exam normal       Cardiovascular hypertension, Pt. on medications     Neuro/Psych negative neurological ROS  negative psych ROS   GI/Hepatic Neg liver ROS, GERD-  ,  Endo/Other  diabetesHypothyroidism   Renal/GU negative Renal ROS  negative genitourinary   Musculoskeletal negative musculoskeletal ROS (+)   Abdominal   Peds negative pediatric ROS (+)  Hematology negative hematology ROS (+)   Anesthesia Other Findings   Reproductive/Obstetrics negative OB ROS                           Anesthesia Physical Anesthesia Plan  ASA: III  Anesthesia Plan: General   Post-op Pain Management:    Induction: Intravenous  Airway Management Planned: Oral ETT  Additional Equipment:   Intra-op Plan:   Post-operative Plan: Extubation in OR  Informed Consent: I have reviewed the patients History and Physical, chart, labs and discussed the procedure including the risks, benefits and alternatives for the proposed anesthesia with the patient or authorized representative who has indicated his/her understanding and acceptance.   Dental advisory given  Plan Discussed with: CRNA, Anesthesiologist and Surgeon  Anesthesia Plan Comments:         Anesthesia Quick Evaluation

## 2013-10-23 NOTE — Op Note (Signed)
10/23/2013  11:15 AM  PATIENT:  Kyle Simon  80 y.o. 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, Synovial Cyst, Radiculopathy L 45   POST-OPERATIVE DIAGNOSIS:  Spondylolisthesis, Lumbar herniated nucleus pulposus without myelopathy, Synovial Cyst, Radiculopathy L 45  PROCEDURE:  Procedure(s) with comments: Lumbar Four-Five Resection of synovial cyst with posterior lumbar interbody fusion (N/A) - Lumbar Four-Five Resection of synovial cyst with posterior lumbar interbody fusion with PEEK interbody cages, pedicle screw fixation, posterolateral arthrodesis with autograft, allograft  Decompression greater than standard PLIF procedure  SURGEON:  Surgeon(s) and Role:    * Erline Levine, MD - Primary    * Ashok Pall, MD - Assisting  PHYSICIAN ASSISTANT:   ASSISTANTS: Poteat, RN   ANESTHESIA:   general  EBL:  Total I/O In: 1600 [I.V.:1600] Out: 4259 [Urine:1200; Blood:150]  BLOOD ADMINISTERED:none  DRAINS: none   LOCAL MEDICATIONS USED:  LIDOCAINE   SPECIMEN:  No Specimen  DISPOSITION OF SPECIMEN:  N/A  COUNTS:  YES  TOURNIQUET:  * No tourniquets in log *  DICTATION: Patient is a 80 year old with recurrent synovial cyst, spondylolisthesis, disc herniation and severe back and left lower extremity pain at L4/5 levels of the lumbar spine. It was elected to take her to surgery for MASPLIF L 45 level with posterolateral arthrodesis.  Procedure:   Following uncomplicated induction of GETA, and placement of electrodes for neural monitoring, patient was turned into a prone position on the [place] table and using AP  fluoroscopy the area of planned incision was marked, prepped with betadine scrub and Duraprep, then draped. Exposure was performed of facet joint complex at L 45 level and the MAS retractor was placed.5.5 x 35 mm cortical Nuvasive screws were placed at L 4 bilaterally according to standard landmarks using  neural monitoring.  A total laminectomy of L 4 was then performed with disarticulation of facets.  This bone was saved for grafting, combined with Osteocel after being run through bone mill and was placed in bone packing device.  Thorough discectomy was performed bilaterally at L 45 and the endplates were prepared for grafting.  Redo laminectomy was performed with removal of synovial cyst with careful removal of compressive cyst and decompression of both L 4 and L 5 nerve roots. 23 x 10 x 12 degree cages were placed in the interspace and positioning was confirmed with AP and lateral fluoroscopy.  10 cc of autograft/Osteocel was packed in the interspace medial to the second cage.   Remaining screws were placed at L 5 and 30 and 25 mm rods were placed.   And the screws were locked and torqued.Final Xrays showed well positioned implants and screw fixation. The posterolateral region was packed with remaining 10 cc of autograft on the right of midline. The wounds were irrigated and then closed with 1, 2-0 and 3-0 Vicryl stitches. 20 cc long-acting Marcaine was infiltrated in the subcutaneous tissues. Sterile occlusive dressing was placed with Dermabond and honeycomb dressing. The patient was then extubated in the operating room and taken to recovery in stable and satisfactory condition having tolerated her operation well. Counts were correct at the end of the case.  PLAN OF CARE: Admit to inpatient   PATIENT DISPOSITION:  PACU - hemodynamically stable.   Delay start of Pharmacological VTE agent (>24hrs) due to surgical blood loss or risk of bleeding: yes

## 2013-10-23 NOTE — Evaluation (Signed)
Occupational Therapy Evaluation Patient Details Name: Kyle Simon MRN: [id number] DOB: 1932/12/31 Today's Date: 10/23/2013    History of Present Illness 80 y.o. s/p Lumbar Four-Five Resection of synovial cyst with posterior lumbar interbody fusion    Clinical Impression   Pt admitted with above. Pt independent with ADLs, PTA. Feel pt will benefit from acute OT to increase independence prior to d/c.     Follow Up Recommendations  No OT follow up;Supervision - Intermittent    Equipment Recommendations  Other (comment) (AE)    Recommendations for Other Services       Precautions / Restrictions Precautions Precautions: Back;Fall Precaution Booklet Issued: Yes (comment) Precaution Comments: Educated on back precautions Required Braces or Orthoses: Spinal Brace Spinal Brace: Lumbar corset;Applied in sitting position Restrictions Weight Bearing Restrictions: No      Mobility Bed Mobility Overal bed mobility: Needs Assistance Bed Mobility: Rolling;Sidelying to Sit;Sit to Sidelying Rolling: Mod assist;Min guard Sidelying to sit: Min assist     Sit to sidelying: Min assist General bed mobility comments: cues for log roll technique. Assistance with LE's when returning to bed.   Transfers Overall transfer level: Needs assistance   Transfers: Sit to/from Stand Sit to Stand: Min guard;Min assist         General transfer comment: educated on technique.    Balance                                            ADL Overall ADL's : Needs assistance/impaired                 Upper Body Dressing : Standing;Sitting;Moderate assistance   Lower Body Dressing: Moderate assistance;Sit to/from stand   Toilet Transfer: Min guard;Minimal assistance;Ambulation (bed)   Toileting- Clothing Manipulation and Hygiene: Min guard;Sit to/from stand       Functional mobility during ADLs: Min guard;Minimal assistance General ADL Comments: Educated on use of  cup for teeth care and placement of grooming items to avoid breaking precautions. Educated on dressing techniques and AE for LB ADLs as pt able to cross legs but difficult time to don socks and keep back straight. Educated on back brace. Educated on safe shoewear. Recommended spouse be with pt for shower transfer and bathing and recommended pt sit on shower chair for LB bathing. Discussed positioning of pillows.      Vision                     Perception     Praxis      Pertinent Vitals/Pain Pain Assessment: 0-10 Pain Score: 3  Pain Location: back Pain Descriptors / Indicators: Throbbing Pain Intervention(s): Repositioned     Hand Dominance Left   Extremity/Trunk Assessment Upper Extremity Assessment Upper Extremity Assessment: Overall WFL for tasks assessed   Lower Extremity Assessment Lower Extremity Assessment: Overall WFL for tasks assessed       Communication Communication Communication: No difficulties   Cognition Arousal/Alertness: Awake/alert Behavior During Therapy: WFL for tasks assessed/performed Overall Cognitive Status: Within Functional Limits for tasks assessed                     General Comments       Exercises       Shoulder Instructions      Home Living Family/patient expects to be discharged to:: Private residence Living Arrangements: Spouse/significant other;Children;Other relatives  Available Help at Discharge: Family;Available 24 hours/day Type of Home: House Home Access: Stairs to enter [employer] of Steps: 4         Bathroom Shower/Tub: Occupational psychologist: Handicapped height     Home Equipment: Environmental consultant - 2 wheels;Cane - single point;Shower seat;Bedside commode          Prior Functioning/Environment Level of Independence: Independent             OT Diagnosis: Acute pain   OT Problem List: Decreased strength;Impaired balance (sitting and/or standing);Decreased knowledge of use of  DME or AE;Decreased knowledge of precautions;Pain   OT Treatment/Interventions: Self-care/ADL training;DME and/or AE instruction;Therapeutic activities;Patient/family education;Balance training    OT Goals(Current goals can be found in the care plan section) Acute Rehab OT Goals Patient Stated Goal: not stated OT Goal Formulation: With patient Time For Goal Achievement: 10/30/13 Potential to Achieve Goals: Good ADL Goals Pt Will Perform Grooming: with modified independence;standing Pt Will Perform Lower Body Dressing: with adaptive equipment;sit to/from stand;with modified independence Pt Will Transfer to Toilet: with modified independence;ambulating (elevated toilet) Pt Will Perform Toileting - Clothing Manipulation and hygiene: with modified independence;sit to/from stand Pt Will Perform Tub/Shower Transfer: Shower transfer;with supervision;ambulating;shower seat Additional ADL Goal #1: Pt will independently perform bed mobility at supervision level as precursor for ADLs.   OT Frequency: Min 2X/week   Barriers to D/C:            Co-evaluation              End of Session Equipment Utilized During Treatment: Gait belt;Back brace  Activity Tolerance: Patient tolerated treatment well Patient left: in bed;with call bell/phone within reach;with family/visitor present   Time: 1829-9371 OT Time Calculation (min): 36 min Charges:  OT General Charges $OT Visit: 1 Procedure OT Evaluation $Initial OT Evaluation Tier I: 1 Procedure OT Treatments $Self Care/Home Management : 8-22 mins G-CodesBenito Mccreedy OTR/L [phone] 10/23/2013, 6:04 PM

## 2013-10-23 NOTE — Anesthesia Postprocedure Evaluation (Signed)
Anesthesia Post Note  Patient: Kyle Simon  Procedure(s) Performed: Procedure(s) (LRB): Lumbar Four-Five Resection of synovial cyst with posterior lumbar interbody fusion (N/A)  Anesthesia type: general  Patient location: PACU  Post pain: Pain level controlled  Post assessment: Patient's Cardiovascular Status Stable  Last Vitals:  Filed Vitals:   10/23/13 1200  BP:   Pulse: 58  Temp:   Resp: 18    Post vital signs: Reviewed and stable  Level of consciousness: sedated  Complications: No apparent anesthesia complications

## 2013-10-23 NOTE — Interval H&P Note (Signed)
History and Physical Interval Note:  10/23/2013 7:31 AM  Kyle Simon  has presented today for surgery, with the diagnosis of Spondylolisthesis, Lumbar hnp without myelopathy, Synovial Cyst, Radiculopathy  The various methods of treatment have been discussed with the patient and family. After consideration of risks, benefits and other options for treatment, the patient has consented to  Procedure(s) with comments: L4-5 Resection of synovial cyst with posterior lumbar interbody fusion (N/A) - L4-5 Resection of synovial cyst with posterior lumbar interbody fusion as a surgical intervention .  The patient's history has been reviewed, patient examined, no change in status, stable for surgery.  I have reviewed the patient's chart and labs.  Questions were answered to the patient's satisfaction.     Jams Trickett D

## 2013-10-23 NOTE — Anesthesia Procedure Notes (Signed)
Procedure Name: Intubation Date/Time: 10/23/2013 7:34 AM Performed by: Susa Loffler Pre-anesthesia Checklist: Patient identified, Timeout performed, Emergency Drugs available, Suction available and Patient being monitored Patient Re-evaluated:Patient Re-evaluated prior to inductionOxygen Delivery Method: Circle system utilized Preoxygenation: Pre-oxygenation with 100% oxygen Intubation Type: IV induction Laryngoscope Size: Mac and 4 Grade View: Grade II Tube type: Oral Tube size: 7.5 mm Number of attempts: 1 Airway Equipment and Method: Stylet Placement Confirmation: ETT inserted through vocal cords under direct vision,  positive ETCO2 and breath sounds checked- equal and bilateral Secured at: 22 cm Tube secured with: Tape Dental Injury: Teeth and Oropharynx as per pre-operative assessment

## 2013-10-23 NOTE — H&P (Signed)
[place], [place] [address] Phone: [phone]   Patient ID:   [id number] Patient: Kyle Simon  Date of Birth: 1932-04-01 Visit Type: Office Visit   Date: 09/24/2013 09:30 AM Provider: Marchia Meiers. Vertell Limber MD   This 80 year old male presents for Follow Up of Hip pain and Follow Up of Low back pain.  History of Present Illness: 1.  Follow Up of Hip pain  2.  Follow Up of Low back pain  The patient continues to have a left L4 radiculopathy.  His examination is unchanged.  He has an MRI which shows a large left-sided disc herniation at L4-L5 with cephalad migration of disc material as well as a recurrent synovial cyst on the left.  The discrimination appears to be compressing the left L4 nerve root and the cyst appears to be effacing the left L5 nerve root.  I believe the disc herniation is more problematic since this is most consistent with his L4 radiculopathy.  He has retrolisthesis and degenerative changes throughout his lumbar spine other levels.  None of these are currently symptomatic.  I have recommended to the patient that he undergo redo decompression along with fusion using mass plate technique at the L4 L5 level this will involve removing the disc herniation as well as the recurrent synovial cyst.  Risks and benefits were discussed in detail with the patient and he wishes to proceed.  He was fitted for an LSO brace today.  Nurse education was performed.      Medical/Surgical/Interim History Reviewed, no change.    Family History: Reviewed, no changes.   Patient reports there is no relevant family history.   Social History: Tobacco use reviewed. Reviewed, no changes.     MEDICATIONS(added, continued or stopped this visit):   Started Medication Directions Instruction Stopped   aspirin 81 mg tablet,delayed release take 1 tablet by oral route  every day     Celebrex 200 mg capsule take 1 capsule by oral route  every day as needed     Crestor 5 mg tablet take 1 tablet by oral route  every day     Flomax 0.4 mg capsule take 1 capsule by oral route 2 times every day 1/2 hour following the same meal each day     losartan 50 mg tablet take 0.5 tablet by oral route  every day     Synthroid 150 mcg tablet take 1 tablet by oral route  every day     Tylenol Extra Strength 500 mg tablet take 2 tablet by oral route  every day      ALLERGIES:  Ingredient Reaction Medication Name Comment  SIMVASTATIN  Zocor leg cramps  Reviewed, no changes.   Vitals Date Temp F BP Pulse Ht In Wt Lb BMI BSA Pain Score  09/24/2013  155/72 48 71 193 26.92  6/10      DIAGNOSTIC RESULTS Diagnostic report text  CLINICAL DATA: 80 year old male with history of prior spine surgery most recently in 2013. New onset left side low back pain radiating to the left hip and lower extremity with numbness and tingling. Initial encounter.  EXAM: MRI LUMBAR SPINE WITHOUT AND WITH CONTRAST  TECHNIQUE: Multiplanar and multiecho pulse sequences of the lumbar spine were obtained without and with intravenous contrast.  CONTRAST: 18 mL MultiHance.  COMPARISON: [facility] lumbar MRI 04/20/2011, and earlier.  FINDINGS: Same numbering system as on 04/20/2011. Progressed grade 1 anterolisthesis of L4 on L5. Stable  vertebral height and alignment elsewhere. Mild endplate marrow edema posteriorly on the left at L4-L5 appears degenerative in nature. No acute osseous abnormality identified.  Visualized lower thoracic spinal cord is normal with conus medularis at L1. Stable appearance of ventriculus terminalis normal anatomic variant.  Visualized abdominal viscera and paraspinal soft tissues are within normal limits.  T10-T11: Faintly visible, grossly negative.  T11-T12: Chronic disc bulge and mild endplate spurring. No definite stenosis.  T12-L1: Mild circumferential disc osteophyte complex. No stenosis.  L1-L2: Mild  circumferential disc osteophyte complex. No stenosis.  L2-L3: Chronic severe disc space loss. Stable left eccentric circumferential disc osteophyte complex. No significant stenosis.  L3-L4: Stable mild disc bulge and facet hypertrophy. Mild foraminal endplate spurring. No significant stenosis.  L4-L5: Increased anterolisthesis. New vacuum disc phenomena. Cephalad disc extrusion arising in the region of the left lateral recess (series 6, image 20). The component of migrated disc measures up to 13 mm in length (series 8, image 10). Some of this could be a separate sequestered fragment.  Additionally, there is been recurrence of multiple de degenerative left lateral recess synovial (the largest cysts 18 mm in length by 11 mm diameter on series 6, image 23 and series 8, image 11 ; while a smaller more posteriorly situated 6 x 12 mm lesion is depicted on series 8, image 10.  Collectively these lesions resulting in severe stenosis at the level of the descending and exiting left L4 nerve as well as the descending left L5 nerve roots.  Underlying severe bilateral facet hypertrophy status post previous laminar surgery. No overall spinal stenosis at this level. There is mild to moderate multifactorial right L4 foraminal stenosis as well (series 8, image 6).  L5-S1: Negative disc. Mild to moderate facet hypertrophy. No stenosis.  IMPRESSION: 1. Severe multifactorial left side stenosis at L4-L5 do a combination of cephalad disc herniation and recurrent Large synovial cysts. There is new mild grade 1 anterolisthesis at this level. 2. Otherwise stable lumbar spine.   Electronically Signed By: Lars Pinks M.D. On: 09/20/2013 14:38    IMPRESSION Recurrent synovial cyst with spondylolisthesis of L4 and L5 with a large disc herniation at L4 L5 on the left  Completed Orders (this encounter) Order Details Reason Side Interpretation Result Initial Treatment Date Region  Lifestyle education  regarding diet Encouraged to eat a well balanced diet and follow up with primary care physician.        Hypertension education Continue to monitor blood pressure. If remains elevated, contact primary care physician.         Assessment/Plan # Detail Type Description   1. Assessment BMI 26.0-26.9,ADULT (V85.22).   Plan Orders Today's instructions / counseling include(s) Lifestyle education regarding diet.       2. Assessment Abnormal findings, elevated BP w/o HTN (796.2).         Pain Assessment/Treatment Pain Scale: 6/10. Method: Numeric Pain Intensity Scale. Location: back/left hip. Onset: 08/11/2013. Duration: varies. Quality: aching. Pain Assessment/Treatment follow-up plan of care: Patient taking Tylenol and Tramadol for pain..  Fall Risk Plan The patient has not fallen in the last year.  Redo decompression with posterior fixation and fusion via MAS PLIF technique L4 L5 level  Orders: Instruction(s)/Education: Assessment Instruction  796.2 Hypertension education  V85.22 Lifestyle education regarding diet             Provider:  Marchia Meiers. Vertell Limber MD  09/24/2013 09:44 AM Dictation edited by: Marchia Meiers. Vertell Limber    CC Providers: Bo Merino [facility] [place], [place] [address]- ----------------------------------------------------------------------------------------------------------------------------------------------------------------------         Electronically signed by Marchia Meiers. Vertell Limber MD on 09/29/2013 02:01 PM

## 2013-10-23 NOTE — Progress Notes (Signed)
Utilization review completed.  

## 2013-10-23 NOTE — Progress Notes (Signed)
Lunch relief by R. Engineer, materials

## 2013-10-23 NOTE — Progress Notes (Signed)
Awake, alert, conversant.  Leg pain resolved.  Strength full.  Doing well.

## 2013-10-23 NOTE — Transfer of Care (Signed)
Immediate Anesthesia Transfer of Care Note  Patient: Kyle Simon  Procedure(s) Performed: Procedure(s) with comments: Lumbar Four-Five Resection of synovial cyst with posterior lumbar interbody fusion (N/A) - Lumbar Four-Five Resection of synovial cyst with posterior lumbar interbody fusion  Patient Location: PACU  Anesthesia Type:General  Level of Consciousness: awake, alert  and oriented  Airway & Oxygen Therapy: Patient Spontanous Breathing and Patient connected to nasal cannula oxygen  Post-op Assessment: Report given to PACU RN and Post -op Vital signs reviewed and stable  Post vital signs: Reviewed and stable  Complications: No apparent anesthesia complications

## 2013-10-24 NOTE — Discharge Summary (Signed)
Physician Discharge Summary  Patient ID: Kyle Simon MRN: [id number] DOB/AGE: [birth year] 80 y.o.  Admit date: 10/23/2013 Discharge date: 10/24/2013  Admission Diagnoses: Spondylolisthesis, Lumbar herniated nucleus pulposus without myelopathy, Synovial Cyst, Radiculopathy L 45   Discharge Diagnoses: Spondylolisthesis, Lumbar herniated nucleus pulposus without myelopathy, Synovial Cyst, Radiculopathy L 45 s/p Lumbar Four-Five Resection of synovial cyst with posterior lumbar interbody fusion (N/A) - Lumbar Four-Five Resection of synovial cyst with posterior lumbar interbody fusion with PEEK interbody cages, pedicle screw fixation, posterolateral arthrodesis with autograft, allograft  Active Problems:   Spondylolisthesis of lumbar region   Discharged Condition: good  Hospital Course: [name] was admitted for surgery with dx spondylolisthesis, HNP, and radiculopathy. Following uncomplicated G8-9 MAS PLIF with resection of synovial cyst, he recovered nicely and transferred to 3500 for nursing care and therapy.  Consults: None  Significant Diagnostic Studies: radiology: X-Ray: intra-operative  Treatments: surgery: Lumbar Four-Five Resection of synovial cyst with posterior lumbar interbody fusion (N/A) - Lumbar Four-Five Resection of synovial cyst with posterior lumbar interbody fusion with PEEK interbody cages, pedicle screw fixation, posterolateral arthrodesis with autograft, allograft   Discharge Exam: Blood pressure 141/74, pulse 53, temperature 97.7 F (36.5 C), temperature source Oral, resp. rate 16, SpO2 96.00%. Alert, conversant, reporting only incisional discomfort. Good strength BLE. Incision with honeycomb drsg over Dermabond. No erythema,.swelling, or drainage.   Disposition: 01-Home or Self Care  Rx's Tramadol & Tizanidine will be called to [place] at pt request. Pt verbalizes understanding of d/c instructions & agrees to call office to schedule 3-4 wk f/u  with DrStern.      Medication List    ASK your doctor about these medications       acetaminophen 500 MG tablet  Commonly known as:  TYLENOL  Take 500 mg by mouth every 8 (eight) hours as needed for mild pain.     ARTIFICIAL TEARS OP  Place 1 drop into both eyes 2 (two) times daily.     aspirin EC 81 MG tablet  Take 81 mg by mouth daily.     CALCIUM-MAGNESIUM-ZINC PO  Take 1 tablet by mouth daily.     celecoxib 200 MG capsule  Commonly known as:  CELEBREX  Take 200 mg by mouth daily.     FISH OIL PO  Take 1 capsule by mouth daily.     FLOMAX 0.4 MG Caps capsule  Generic drug:  tamsulosin  Take 0.4 mg by mouth 2 (two) times daily.     Glucosamine HCl 1500 MG Tabs  Take 1,500 mg by mouth daily.     glucose blood test strip  Commonly known as:  PRECISION XTRA TEST STRIPS  Check blood sugar once daily as directed, DX 250.00     levothyroxine 150 MCG tablet  Commonly known as:  SYNTHROID, LEVOTHROID  Take 150 mcg by mouth daily before breakfast.     losartan 50 MG tablet  Commonly known as:  COZAAR  Take 25 mg by mouth every other day.     multivitamin with minerals Tabs tablet  Take 1 tablet by mouth daily.     rosuvastatin 5 MG tablet  Commonly known as:  CRESTOR  Take 5 mg by mouth at bedtime.     traMADol 50 MG tablet  Commonly known as:  ULTRAM  Take 50 mg by mouth every 6 (six) hours as needed for moderate pain.     VITAMIN B-12 PO  Take 1 tablet by mouth daily.  Signed: Verdis Prime 10/24/2013, 8:18 AM

## 2013-10-24 NOTE — Progress Notes (Signed)
Subjective: Patient reports "I just have some incisional pain. The Tramadol works well."  Objective: Vital signs in last 24 hours: Temp:  [97.5 F (36.4 C)-98.6 F (37 C)] 97.7 F (36.5 C) (08/26 0801) Pulse Rate:  [50-73] 53 (08/26 0801) Resp:  [9-21] 16 (08/26 0801) BP: (105-146)/(55-83) 141/74 mmHg (08/26 0801) SpO2:  [93 %-100 %] 96 % (08/26 0801)  Intake/Output from previous day: 08/25 0701 - 08/26 0700 In: 2140 [P.O.:360; I.V.:1780] Out: 2600 [Urine:2450; Blood:150] Intake/Output this shift:    Alert, conversant, reporting only incisional discomfort. Good strength BLE. Incision with honeycomb drsg over Dermabond. No erythema,.swelling, or drainage.  Lab Results: No results found for this basename: WBC, HGB, HCT, PLT,  in the last 72 hours BMET No results found for this basename: NA, K, CL, CO2, GLUCOSE, BUN, CREATININE, CALCIUM,  in the last 72 hours  Studies/Results: Dg Lumbar Spine 2-3 Views  10/23/2013   CLINICAL DATA:  Spondylolisthesis, lumbar disc herniation without myelopathy, synovial cyst, radiculopathy, lumbar fusion  EXAM: DG C-ARM 61-120 MIN; LUMBAR SPINE - 2-3 VIEW  TECHNIQUE: Two digital C-arm fluoroscopic images obtained intraoperatively are submitted for interpretation.  FLUOROSCOPY TIME:  0 min 59 seconds  COMPARISON:  Lumbar spine MRI 09/20/2013, lumbar radiographs 09/17/2013  FINDINGS: Preceding MRI labeled with 5 lumbar vertebrae.  PA and lateral images of the lower lumbar spine demonstrate placement of BILATERAL pedicle screws at L4 and L5.  Disc prosthesis present at L4-L5 disc space.  Bones appear demineralized.  No fracture or significant subluxation seen.  IMPRESSION: Intraoperative images during posterior fusion of L4-L5.   Electronically Signed   By: Lavonia Dana M.D.   On: 10/23/2013 11:03   Dg C-arm 1-60 Min  10/23/2013   CLINICAL DATA:  Spondylolisthesis, lumbar disc herniation without myelopathy, synovial cyst, radiculopathy, lumbar fusion  EXAM:  DG C-ARM 61-120 MIN; LUMBAR SPINE - 2-3 VIEW  TECHNIQUE: Two digital C-arm fluoroscopic images obtained intraoperatively are submitted for interpretation.  FLUOROSCOPY TIME:  0 min 59 seconds  COMPARISON:  Lumbar spine MRI 09/20/2013, lumbar radiographs 09/17/2013  FINDINGS: Preceding MRI labeled with 5 lumbar vertebrae.  PA and lateral images of the lower lumbar spine demonstrate placement of BILATERAL pedicle screws at L4 and L5.  Disc prosthesis present at L4-L5 disc space.  Bones appear demineralized.  No fracture or significant subluxation seen.  IMPRESSION: Intraoperative images during posterior fusion of L4-L5.   Electronically Signed   By: Lavonia Dana M.D.   On: 10/23/2013 11:03    Assessment/Plan: Improving   LOS: 1 day  Per DrStern, d/c to home. Rx's Tramadol & Tizanidine will be called to [facility] at pt request. Pt verbalizes understanding of d/c instructions & agrees to call office to schedule 3-4 wk f/u with DrStern.   Verdis Prime 10/24/2013, 8:13 AM

## 2013-10-24 NOTE — Evaluation (Signed)
Physical Therapy Evaluation Patient Details Name: Kyle Simon MRN: [id number] DOB: Nov 17, 1932 Today's Date: 10/24/2013   History of Present Illness  80 y.o. s/p Lumbar Four-Five Resection of synovial cyst with posterior lumbar interbody fusion   Clinical Impression  Patient evaluated for discharge needs and education. Patient educated regarding proper use of brace, precautions, and mobility expectations. Patient did require some assist with bed mobility, VCs for positioning and sequencing. Patient did ambulate and perform stair negotiation without difficulty. No further acute PT needs a this time.     Follow Up Recommendations No PT follow up    Equipment Recommendations  None recommended by PT    Recommendations for Other Services       Precautions / Restrictions Precautions Precautions: Back;Fall Precaution Booklet Issued: Yes (comment) Precaution Comments: Educated on back precautions Required Braces or Orthoses: Spinal Brace Spinal Brace: Lumbar corset;Applied in sitting position Restrictions Weight Bearing Restrictions: No      Mobility  Bed Mobility Overal bed mobility: Needs Assistance Bed Mobility: Rolling;Sidelying to Sit;Sit to Sidelying Rolling: Mod assist;Min guard Sidelying to sit: Min assist     Sit to sidelying: Min assist General bed mobility comments: Verbal cues for psotioning and technique, increased pain during trasition  Transfers Overall transfer level: Needs assistance   Transfers: Sit to/from Stand Sit to Stand: Min guard;Min assist         General transfer comment: VCs for hand placement  Ambulation/Gait Ambulation/Gait assistance: Supervision Ambulation Distance (Feet): 410 Feet Assistive device: None Gait Pattern/deviations: Step-through pattern;Decreased stride length;Narrow base of support Gait velocity: decreased Gait velocity interpretation: Below normal speed for age/gender General Gait Details: some instability noted  initially, VCs for upright posture during ambulatino  Stairs Stairs: Yes Stairs assistance: Supervision Stair Management: One rail Right;Alternating pattern;Forwards Number of Stairs: 4 General stair comments: increased time to perform  Wheelchair Mobility    Modified Rankin (Stroke Patients Only)       Balance                                             Pertinent Vitals/Pain Pain Assessment: 0-10 Pain Score: 2  Pain Location: back Pain Descriptors / Indicators: Aching Pain Intervention(s): Monitored during session;Repositioned;Relaxation    Home Living Family/patient expects to be discharged to:: Private residence Living Arrangements: Spouse/significant other;Children;Other relatives Available Help at Discharge: Family;Available 24 hours/day Type of Home: House Home Access: Stairs to enter   [employer] of Steps: 4   Home Equipment: Walker - 2 wheels;Cane - single point;Shower seat;Bedside commode      Prior Function Level of Independence: Independent               Hand Dominance   Dominant Hand: Left    Extremity/Trunk Assessment   Upper Extremity Assessment: Overall WFL for tasks assessed           Lower Extremity Assessment: Overall WFL for tasks assessed         Communication   Communication: No difficulties  Cognition Arousal/Alertness: Awake/alert Behavior During Therapy: WFL for tasks assessed/performed Overall Cognitive Status: Within Functional Limits for tasks assessed                      General Comments      Exercises        Assessment/Plan    PT Assessment Patent does not need  any further PT services  PT Diagnosis     PT Problem List    PT Treatment Interventions     PT Goals (Current goals can be found in the Care Plan section) Acute Rehab PT Goals Patient Stated Goal: play golf again PT Goal Formulation: No goals set, d/c therapy    Frequency     Barriers to discharge         Co-evaluation               End of Session Equipment Utilized During Treatment: Gait belt;Back brace Activity Tolerance: Patient tolerated treatment well Patient left: in bed;with call bell/phone within reach Nurse Communication: Mobility status         Time: 0300-9233 PT Time Calculation (min): 16 min   Charges:   PT Evaluation $Initial PT Evaluation Tier I: 1 Procedure PT Treatments $Gait Training: 8-22 mins   PT G CodesDuncan Dull 10/24/2013, 11:17 AM Alben Deeds, [place] DPT  [phone]

## 2013-10-24 NOTE — Plan of Care (Signed)
Problem: Consults Goal: Diagnosis - Spinal Surgery Outcome: Completed/Met Date Met:  10/23/13 [place]/Lumbar Spine Fusion

## 2013-10-24 NOTE — Progress Notes (Signed)
Pt doing well. Pt and wife given D/C instructions, verbal understanding was provided. Pt's IV was removed prior to D/C. Pt's incision is covered with Honeycomb dressing and is clean and dry. Pt D/C'd home with corsett brace per MD order. Pt D/C'd home via wheelchair @ 1000 per MD order. Pt is stable @ D/C and has no other needs at this time. Holli Humbles, RN

## 2013-10-24 NOTE — Discharge Instructions (Signed)
Wound Care Leave incision open to air. You may shower. Do not scrub directly on incision.  Do not put any creams, lotions, or ointments on incision. Activity Walk each and every day, increasing distance each day. No lifting greater than 5 lbs.  Avoid bending, arching, and twisting. No driving for 2 weeks; may ride as a passenger locally. If provided with back brace, wear when out of bed.  It is not necessary to wear in bed. Diet Resume your normal diet.  Return to Work Will be discussed at you follow up appointment. Call Your Doctor If Any of These Occur Redness, drainage, or swelling at the wound.  Temperature greater than 101 degrees. Severe pain not relieved by pain medication. Incision starts to come apart. Follow Up Appt Call today for appointment in 3-4 weeks ([phone]) or for problems.  If you have any hardware placed in your spine, you will need an x-ray before your appointment.    Spinal Fusion Care After Refer to this sheet in the next few weeks. These instructions provide you with information on caring for yourself after your procedure. Your caregiver may also give you more specific instructions. Your treatment has been planned according to current medical practices, but problems sometimes occur. Call your caregiver if you have any problems or questions after your procedure. HOME CARE INSTRUCTIONS   Take whatever pain medicine has been prescribed by your caregiver. Do not take over-the-counter pain medicine unless directed otherwise by your caregiver.  Do not drive if you are taking narcotic pain medicines.  Change your bandage (dressing) if necessary or as directed by your caregiver.  Do not get your surgical cut (incision) wet. After a few days you may take quick showers (rather than baths), but keep your incision clean and dry. Covering the incision with plastic wrap while you shower should keep your incision dry. A few weeks after surgery, once your incision has  healed and your caregiver says it is okay, you can take baths or go swimming.  If you have been prescribed medicine to prevent your blood from clotting, follow the directions carefully.  Check the area around your incision often. Look for redness and swelling. Also, look for anything leaking from your wound. You can use a mirror or have a family member inspect your incision if it is in a place where it is difficult for you to see.  Ask your caregiver what activities you should avoid and for how long.  Walk as much as possible.  Do not lift anything heavier than 10 pounds (4.5 kilograms) until your caregiver says it is safe.  Do not twist or bend for a few weeks. Try not to pull on things. Avoid sitting for long periods of time. Change positions at least every hour.  Ask your caregiver what kinds of exercise you should do to make your back stronger and when you should begin doing these exercises. SEEK IMMEDIATE MEDICAL CARE IF:   Pain suddenly becomes much worse.  The incision area is red, swollen, bleeding, or leaking fluid.  Your legs or feet become increasingly painful, numb, weak, or swollen.  You have trouble controlling urination or bowel movements.  You have trouble breathing.  You have chest pain.  You have a fever. MAKE SURE YOU:  Understand these instructions.  Will watch your condition.  Will get help right away if you are not doing well or get worse. Document Released: 09/04/2004 Document Revised: 05/10/2011 Document Reviewed: 04/30/2010 [facility] Patient Information 2015 [place], [place].  This information is not intended to replace advice given to you by your health care provider. Make sure you discuss any questions you have with your health care provider. ° °

## 2013-10-25 NOTE — Progress Notes (Signed)
Patient doing well.  Discharge home.

## 2013-11-12 DIAGNOSIS — M79609 Pain in unspecified limb: Secondary | ICD-10-CM | POA: Diagnosis not present

## 2013-11-12 DIAGNOSIS — I739 Peripheral vascular disease, unspecified: Secondary | ICD-10-CM | POA: Diagnosis not present

## 2013-11-12 DIAGNOSIS — L608 Other nail disorders: Secondary | ICD-10-CM | POA: Diagnosis not present

## 2013-11-19 DIAGNOSIS — M545 Low back pain, unspecified: Secondary | ICD-10-CM | POA: Diagnosis not present

## 2013-11-21 DIAGNOSIS — M79609 Pain in unspecified limb: Secondary | ICD-10-CM | POA: Diagnosis not present

## 2013-11-21 DIAGNOSIS — IMO0002 Reserved for concepts with insufficient information to code with codable children: Secondary | ICD-10-CM | POA: Diagnosis not present

## 2013-11-21 DIAGNOSIS — G609 Hereditary and idiopathic neuropathy, unspecified: Secondary | ICD-10-CM | POA: Diagnosis not present

## 2013-12-06 DIAGNOSIS — M792 Neuralgia and neuritis, unspecified: Secondary | ICD-10-CM | POA: Diagnosis not present

## 2013-12-10 ENCOUNTER — Other Ambulatory Visit: Payer: Self-pay | Admitting: Internal Medicine

## 2013-12-12 ENCOUNTER — Other Ambulatory Visit: Payer: Self-pay | Admitting: Internal Medicine

## 2013-12-25 ENCOUNTER — Other Ambulatory Visit: Payer: Self-pay | Admitting: General Practice

## 2013-12-25 MED ORDER — ROSUVASTATIN CALCIUM 5 MG PO TABS: 5.0000 mg | ORAL_TABLET | Freq: Every day | ORAL | Status: DC

## 2014-01-09 DIAGNOSIS — M7130 Other bursal cyst, unspecified site: Secondary | ICD-10-CM | POA: Diagnosis not present

## 2014-01-29 LAB — HM DIABETES FOOT EXAM: HM Diabetic Foot Exam: NORMAL

## 2014-02-13 DIAGNOSIS — Z79899 Other long term (current) drug therapy: Secondary | ICD-10-CM | POA: Diagnosis not present

## 2014-02-14 DIAGNOSIS — M7731 Calcaneal spur, right foot: Secondary | ICD-10-CM | POA: Diagnosis not present

## 2014-02-14 DIAGNOSIS — M722 Plantar fascial fibromatosis: Secondary | ICD-10-CM | POA: Diagnosis not present

## 2014-02-14 DIAGNOSIS — M79673 Pain in unspecified foot: Secondary | ICD-10-CM | POA: Diagnosis not present

## 2014-02-14 DIAGNOSIS — I739 Peripheral vascular disease, unspecified: Secondary | ICD-10-CM | POA: Diagnosis not present

## 2014-02-14 DIAGNOSIS — L603 Nail dystrophy: Secondary | ICD-10-CM | POA: Diagnosis not present

## 2014-02-18 DIAGNOSIS — M722 Plantar fascial fibromatosis: Secondary | ICD-10-CM | POA: Diagnosis not present

## 2014-02-18 DIAGNOSIS — M71571 Other bursitis, not elsewhere classified, right ankle and foot: Secondary | ICD-10-CM | POA: Diagnosis not present

## 2014-02-18 DIAGNOSIS — M65871 Other synovitis and tenosynovitis, right ankle and foot: Secondary | ICD-10-CM | POA: Diagnosis not present

## 2014-02-19 DIAGNOSIS — D239 Other benign neoplasm of skin, unspecified: Secondary | ICD-10-CM | POA: Diagnosis not present

## 2014-02-19 DIAGNOSIS — L570 Actinic keratosis: Secondary | ICD-10-CM | POA: Diagnosis not present

## 2014-03-07 ENCOUNTER — Other Ambulatory Visit: Payer: Self-pay | Admitting: Family Medicine

## 2014-03-07 DIAGNOSIS — R3912 Poor urinary stream: Secondary | ICD-10-CM | POA: Diagnosis not present

## 2014-03-07 NOTE — Telephone Encounter (Signed)
Med filled. Letter mailed again for pt to schedule an appt.

## 2014-03-15 ENCOUNTER — Ambulatory Visit (INDEPENDENT_AMBULATORY_CARE_PROVIDER_SITE_OTHER): Payer: Medicare Other | Admitting: Internal Medicine

## 2014-03-15 ENCOUNTER — Encounter: Payer: Self-pay | Admitting: Internal Medicine

## 2014-03-15 VITALS — BP 138/76 | HR 60 | Temp 97.7°F | Ht 69.0 in | Wt 201.0 lb

## 2014-03-15 DIAGNOSIS — J310 Chronic rhinitis: Secondary | ICD-10-CM

## 2014-03-15 DIAGNOSIS — L03221 Cellulitis of neck: Secondary | ICD-10-CM | POA: Diagnosis not present

## 2014-03-15 MED ORDER — MUPIROCIN 2 % EX OINT: 1.0000 "application " | TOPICAL_OINTMENT | Freq: Two times a day (BID) | CUTANEOUS | Status: DC

## 2014-03-15 NOTE — Progress Notes (Signed)
   Subjective:    Patient ID: Kyle Simon, male    DOB: 05-26-1932, 81 y.o.   MRN: [id number]  HPI   Symptoms began 03/14/14 in the morning as pain in the right retroauricular area. He noted a small bump there which has decreased in size. This was associated with shooting pains every 15-20 seconds intermittently. The pains have decreased  He questions relationship to recent dental issues he's had  On 03/05/14 he was driving through the [place] when he developed pain in one of the maxillary teeth on the right. His dentist found no abnormalities on films & referred him to an  endodontist who recommended a root canal. He declined this and instead took 7 days of amoxicillin  The symptoms have resolved  He recently has had a viral upper respiratory tract infection type process which is resolving. He still has some rhinitis.  He has tinnitus which is chronic  Review of Systems He specifically denies frontal headache, facial pain, nasal purulence, sore throat, fever,cough, or sputum production    Objective:   Physical Exam  Pertinent positive findings include: There is a very tiny area of erythema posterior to the right mastoid. The mastoid itself is nontender He does have a punctate tattoo of the uvula Grade 1/6 systolic murmur  General appearance:good health ;well nourished; no acute distress or increased work of breathing is present.  No  lymphadenopathy about the head, neck, or axilla noted.  Eyes: No conjunctival inflammation or lid edema is present. There is no scleral icterus. Ears:  External ear exam shows no significant lesions or deformities.  Otoscopic examination reveals clear canals, tympanic membranes are intact bilaterally without bulging, retraction, inflammation or discharge. Nose:  External nasal examination shows no deformity or inflammation. Nasal mucosa are pink and moist without lesions or exudates. No septal dislocation or deviation.No obstruction to airflow.    Oral exam: Dental hygiene is good; lips and gums are healthy appearing.There is no oropharyngeal erythema or exudate noted.  Neck:  No deformities, thyromegaly, masses, or tenderness noted.   Supple with full range of motion without pain.  Heart:  Normal rate and regular rhythm. S1 and S2 normal without gallop, click, rub or other extra sounds.  Lungs:Chest clear to auscultation; no wheezes, rhonchi,rales ,or rubs present.No increased work of breathing.   Extremities:  No cyanosis, edema, or clubbing  noted  Skin: Warm & dry w/o jaundice or tenting.       Assessment & Plan:  #1 minor cellulitis of the skin behind the right mastoid. Clinically there is no evidence of mastoiditis  Plan: See orders recommendations

## 2014-03-15 NOTE — Patient Instructions (Signed)

## 2014-03-15 NOTE — Progress Notes (Signed)
Pre visit review using our clinic review tool, if applicable. No additional management support is needed unless otherwise documented below in the visit note. 

## 2014-03-18 ENCOUNTER — Ambulatory Visit (INDEPENDENT_AMBULATORY_CARE_PROVIDER_SITE_OTHER)
Admission: RE | Admit: 2014-03-18 | Discharge: 2014-03-18 | Disposition: A | Discharge status: Home / Self Care | Payer: Medicare Other | Source: Ambulatory Visit | Attending: Internal Medicine | Admitting: Internal Medicine

## 2014-03-18 ENCOUNTER — Other Ambulatory Visit (INDEPENDENT_AMBULATORY_CARE_PROVIDER_SITE_OTHER): Payer: Medicare Other

## 2014-03-18 ENCOUNTER — Ambulatory Visit (INDEPENDENT_AMBULATORY_CARE_PROVIDER_SITE_OTHER): Payer: Medicare Other | Admitting: Internal Medicine

## 2014-03-18 ENCOUNTER — Encounter: Payer: Self-pay | Admitting: Internal Medicine

## 2014-03-18 VITALS — BP 148/80 | HR 47 | Temp 97.8°F | Ht 69.0 in | Wt 200.0 lb

## 2014-03-18 DIAGNOSIS — H9201 Otalgia, right ear: Secondary | ICD-10-CM | POA: Diagnosis not present

## 2014-03-18 DIAGNOSIS — H7091 Unspecified mastoiditis, right ear: Secondary | ICD-10-CM | POA: Diagnosis not present

## 2014-03-18 DIAGNOSIS — M47812 Spondylosis without myelopathy or radiculopathy, cervical region: Secondary | ICD-10-CM | POA: Diagnosis not present

## 2014-03-18 LAB — CBC WITH DIFFERENTIAL/PLATELET
Basophils Absolute: 0 10*3/uL (ref 0.0–0.1)
Basophils Relative: 0.3 % (ref 0.0–3.0)
Eosinophils Absolute: 0.1 10*3/uL (ref 0.0–0.7)
Eosinophils Relative: 1.4 % (ref 0.0–5.0)
HCT: 42.3 % (ref 39.0–52.0)
Hemoglobin: 13.7 g/dL (ref 13.0–17.0)
Lymphocytes Relative: 21.4 % (ref 12.0–46.0)
Lymphs Abs: 1.6 10*3/uL (ref 0.7–4.0)
MCHC: 32.4 g/dL (ref 30.0–36.0)
MCV: 90.5 fl (ref 78.0–100.0)
Monocytes Absolute: 0.7 10*3/uL (ref 0.1–1.0)
Monocytes Relative: 9.1 % (ref 3.0–12.0)
Neutro Abs: 5.1 10*3/uL (ref 1.4–7.7)
Neutrophils Relative %: 67.8 % (ref 43.0–77.0)
Platelets: 279 10*3/uL (ref 150.0–400.0)
RBC: 4.68 Mil/uL (ref 4.22–5.81)
RDW: 13.1 % (ref 11.5–15.5)
WBC: 7.5 10*3/uL (ref 4.0–10.5)

## 2014-03-18 LAB — SEDIMENTATION RATE: Sed Rate: 27 mm/hr — ABNORMAL HIGH (ref 0–22)

## 2014-03-18 MED ORDER — AMOXICILLIN-POT CLAVULANATE 875-125 MG PO TABS: 1.0000 | ORAL_TABLET | Freq: Two times a day (BID) | ORAL | Status: DC

## 2014-03-18 NOTE — Progress Notes (Signed)
Pre visit review using our clinic review tool, if applicable. No additional management support is needed unless otherwise documented below in the visit note. 

## 2014-03-18 NOTE — Patient Instructions (Signed)
Your next office appointment will be determined based upon review of your pending labs & x-rays. Those instructions will be transmitted to you through My Chart   Followup as needed for your acute issue. Please report any significant change in your symptoms.

## 2014-03-18 NOTE — Progress Notes (Signed)
   Subjective:    Patient ID: Kyle Simon, male    DOB: Jun 28, 1932, 81 y.o.   MRN: [id number]  HPI   He is having intermittent severe pain up to level VII-VIII at the right mastoid area. He noted swelling in this area 1/16 and 1/17. He has been applying the topical antibiotic.  When last seen there was a question of a possible punctate entry lesion. He denies any injury or trauma. He's had no tick exposure; his pet dogs are "tick free".  He's had no recurrence of the dental pain for which he was given amoxicillin several weeks ago.  He denies any upper respiratory tract infection symptoms.  He has a minor dry cough which is unrelated.   Review of Systems Frontal headache, facial pain , nasal purulence, dental pain, sore throat , otic pain or otic discharge denied. No fever , chills or sweats.  He denies any associated rash.    Objective:   Physical Exam  Pertinent or positive findings include: The left tympanic membrane is scarred with no evidence of active inflammation. He has a 2 x 1.5 slightly swollen area over the right mastoid with very faint erythema and central eschar. It is slightly tender to touch. This is not associated with any scalp rash or lesions.  General appearance:good health ;well nourished; no acute distress or increased work of breathing is present.  No  lymphadenopathy about the head, neck, or axilla noted. Appears younger than stated age Eyes: No conjunctival inflammation or lid edema is present. There is no scleral icterus. Ears:  External ear exam shows no significant lesions or deformities.  Nose:  External nasal examination shows no deformity or inflammation. Nasal mucosa are pink and moist without lesions or exudates. No septal dislocation or deviation.No obstruction to airflow.  Oral exam: Dental hygiene is good; lips and gums are healthy appearing.There is no oropharyngeal erythema or exudate noted. Punctate tattoo of posterior soft palate Neck:  No  deformities, thyromegaly, masses, or tenderness noted.   Supple with full range of motion without pain.  Heart:  Normal rate and regular rhythm. S1 and S2 normal without gallop, murmur, click, rub or other extra sounds.  Lungs:Chest clear to auscultation; no wheezes, rhonchi,rales ,or rubs present.No increased work of breathing.   Skin: Warm & dry w/o jaundice or tenting.       Assessment & Plan:  #1 right mastoiditis  Plan: See orders and recommendations.  He does have tramadol which he has not taken for pain

## 2014-03-20 ENCOUNTER — Ambulatory Visit (INDEPENDENT_AMBULATORY_CARE_PROVIDER_SITE_OTHER): Payer: Medicare Other | Admitting: Family Medicine

## 2014-03-20 ENCOUNTER — Other Ambulatory Visit: Payer: Self-pay | Admitting: Internal Medicine

## 2014-03-20 ENCOUNTER — Encounter: Payer: Self-pay | Admitting: Family Medicine

## 2014-03-20 ENCOUNTER — Encounter: Payer: Self-pay | Admitting: General Practice

## 2014-03-20 VITALS — BP 140/64 | HR 50 | Temp 97.6°F | Resp 16 | Wt 200.2 lb

## 2014-03-20 DIAGNOSIS — I152 Hypertension secondary to endocrine disorders: Secondary | ICD-10-CM | POA: Insufficient documentation

## 2014-03-20 DIAGNOSIS — E1169 Type 2 diabetes mellitus with other specified complication: Secondary | ICD-10-CM

## 2014-03-20 DIAGNOSIS — E119 Type 2 diabetes mellitus without complications: Secondary | ICD-10-CM | POA: Diagnosis not present

## 2014-03-20 DIAGNOSIS — I10 Essential (primary) hypertension: Secondary | ICD-10-CM | POA: Diagnosis not present

## 2014-03-20 DIAGNOSIS — E785 Hyperlipidemia, unspecified: Secondary | ICD-10-CM

## 2014-03-20 DIAGNOSIS — E1159 Type 2 diabetes mellitus with other circulatory complications: Secondary | ICD-10-CM | POA: Insufficient documentation

## 2014-03-20 DIAGNOSIS — Z23 Encounter for immunization: Secondary | ICD-10-CM | POA: Diagnosis not present

## 2014-03-20 DIAGNOSIS — H9201 Otalgia, right ear: Secondary | ICD-10-CM

## 2014-03-20 LAB — BASIC METABOLIC PANEL
BUN: 17 mg/dL (ref 6–23)
CO2: 30 mEq/L (ref 19–32)
Calcium: 9.4 mg/dL (ref 8.4–10.5)
Chloride: 97 mEq/L (ref 96–112)
Creatinine, Ser: 1 mg/dL (ref 0.40–1.50)
GFR: 76.6 mL/min (ref >60.00)
Glucose, Bld: 111 mg/dL — ABNORMAL HIGH (ref 70–99)
Potassium: 4.8 mEq/L (ref 3.5–5.1)
Sodium: 131 mEq/L — ABNORMAL LOW (ref 135–145)

## 2014-03-20 LAB — HEPATIC FUNCTION PANEL
ALT: 17 U/L (ref 0–53)
AST: 19 U/L (ref 0–37)
Albumin: 4.1 g/dL (ref 3.5–5.2)
Alkaline Phosphatase: 69 U/L (ref 39–117)
Bilirubin, Direct: 0.1 mg/dL (ref 0.0–0.3)
Total Bilirubin: 0.5 mg/dL (ref 0.2–1.2)
Total Protein: 7.2 g/dL (ref 6.0–8.3)

## 2014-03-20 LAB — LIPID PANEL
Cholesterol: 119 mg/dL (ref 0–200)
HDL: 41.8 mg/dL (ref >39.00)
LDL Cholesterol: 50 mg/dL (ref 0–99)
NonHDL: 77.2
Total CHOL/HDL Ratio: 3
Triglycerides: 138 mg/dL (ref 0.0–149.0)
VLDL: 27.6 mg/dL (ref 0.0–40.0)

## 2014-03-20 LAB — CBC WITH DIFFERENTIAL/PLATELET
Basophils Absolute: 0 10*3/uL (ref 0.0–0.1)
Basophils Relative: 0.5 % (ref 0.0–3.0)
Eosinophils Absolute: 0.1 10*3/uL (ref 0.0–0.7)
Eosinophils Relative: 2.1 % (ref 0.0–5.0)
HCT: 39.1 % (ref 39.0–52.0)
Hemoglobin: 13 g/dL (ref 13.0–17.0)
Lymphocytes Relative: 19.6 % (ref 12.0–46.0)
Lymphs Abs: 1.2 10*3/uL (ref 0.7–4.0)
MCHC: 33.3 g/dL (ref 30.0–36.0)
MCV: 89.6 fl (ref 78.0–100.0)
Monocytes Absolute: 0.7 10*3/uL (ref 0.1–1.0)
Monocytes Relative: 11.4 % (ref 3.0–12.0)
Neutro Abs: 4.2 10*3/uL (ref 1.4–7.7)
Neutrophils Relative %: 66.4 % (ref 43.0–77.0)
Platelets: 258 10*3/uL (ref 150.0–400.0)
RBC: 4.36 Mil/uL (ref 4.22–5.81)
RDW: 12.9 % (ref 11.5–15.5)
WBC: 6.3 10*3/uL (ref 4.0–10.5)

## 2014-03-20 LAB — HEMOGLOBIN A1C: Hgb A1c MFr Bld: 7.2 % — ABNORMAL HIGH (ref 4.6–6.5)

## 2014-03-20 LAB — TSH: TSH: 1.33 u[IU]/mL (ref 0.35–4.50)

## 2014-03-20 NOTE — Progress Notes (Signed)
   Subjective:    Patient ID: Kyle Simon, male    DOB: 23-Oct-1932, 81 y.o.   MRN: [id number]  HPI DM- chronic problem, pt is attempting to control w/ healthy diet and regular exercise.  On ARB for renal protection.  UTD on eye exam (due in Feb- already scheduled)  CBG this AM 108 fasting.  Denies symptomatic lows.  No numbness/tingling hands/feet.  Pt sees Podiatry [facility]) last appt 12/15  HTN- chronic problem, on Losartan.  Denies CP, SOB, HAs, visual changes, edema.  Hyperlipidemia- chronic problem, on Crestor.  Denies abd pain, N/V, myalgias.  Mastoiditis- pt has seen Hopp x2 for pain behind R ear.  On abx- Augmentin.  Hopp recommended ENT evaluation.  Pt needs referral.  Review of Systems For ROS see HPI     Objective:   Physical Exam  Constitutional: He is oriented to person, place, and time. He appears well-developed and well-nourished. No distress.  HENT:  Head: Normocephalic and atraumatic.  Just behind R ear has area of fluctuance, scabbing, and mild TTP.  No obvious TTP over mastoid  Eyes: Conjunctivae and EOM are normal. Pupils are equal, round, and reactive to light.  Neck: Normal range of motion. Neck supple. No thyromegaly present.  Cardiovascular: Normal rate, regular rhythm, normal heart sounds and intact distal pulses.   No murmur heard. Pulmonary/Chest: Effort normal and breath sounds normal. No respiratory distress.  Abdominal: Soft. Bowel sounds are normal. He exhibits no distension.  Musculoskeletal: He exhibits no edema.  Lymphadenopathy:    He has no cervical adenopathy.  Neurological: He is alert and oriented to person, place, and time. No cranial nerve deficit.  Skin: Skin is warm and dry.  Psychiatric: He has a normal mood and affect. His behavior is normal.  Vitals reviewed.         Assessment & Plan:

## 2014-03-20 NOTE — Assessment & Plan Note (Signed)
Chronic problem.  Tolerating statin w/o difficulty.  Check labs.  Adjust meds prn  

## 2014-03-20 NOTE — Assessment & Plan Note (Signed)
Chronic problem, has never required meds.  Controlled w/ healthy diet and regular activity.  UTD on eye exam, seeing podiatry regularly.  On ARB for renal protection.  Check labs.  Start meds prn.

## 2014-03-20 NOTE — Patient Instructions (Signed)
Schedule your complete physical in 6 months We'll notify you of your lab results and make any changes if needed We'll call you with your ENT appt- continue the Augmentin (this is a great choice!) Call your insurance company and ask if the shingles vaccine is covered- and if yes, here in the office or at the pharmacy Keep up the good work!  You look great! Call with any questions or concerns Happy New Year!

## 2014-03-20 NOTE — Assessment & Plan Note (Signed)
New.  Pt has seen Dr Linna Darner x2 for this issue and pt reports he was dx'd w/ mastoiditis.  Currently on augmentin.  No TTP over mastoid and pt's xray showed clear mastoids bilaterally.  Suspect this is more of an infected cyst vs lymphadenitis.  Agree w/ augmentin.  Dr Linna Darner had told pt that he should see ENT and pt is interested in proceeding w/ this.  Discussed that this should clear w/ use of abx but will refer to ENT at pt's request.

## 2014-03-20 NOTE — Assessment & Plan Note (Signed)
Chronic problem.  Adequate control.  Asymptomatic.  Check labs.  No anticipated med changes 

## 2014-03-20 NOTE — Progress Notes (Signed)
Pre visit review using our clinic review tool, if applicable. No additional management support is needed unless otherwise documented below in the visit note. 

## 2014-03-21 ENCOUNTER — Telehealth: Payer: Self-pay | Admitting: Family Medicine

## 2014-03-21 ENCOUNTER — Encounter: Payer: Self-pay | Admitting: General Practice

## 2014-03-21 NOTE — Telephone Encounter (Signed)
emmi emailed °

## 2014-03-25 ENCOUNTER — Encounter: Payer: Self-pay | Admitting: Family Medicine

## 2014-03-25 ENCOUNTER — Other Ambulatory Visit: Payer: Self-pay | Admitting: General Practice

## 2014-03-25 ENCOUNTER — Other Ambulatory Visit: Payer: Self-pay | Admitting: Otolaryngology

## 2014-03-25 DIAGNOSIS — R220 Localized swelling, mass and lump, head: Secondary | ICD-10-CM | POA: Diagnosis not present

## 2014-03-25 DIAGNOSIS — L0390 Cellulitis, unspecified: Secondary | ICD-10-CM | POA: Diagnosis not present

## 2014-03-25 DIAGNOSIS — L570 Actinic keratosis: Secondary | ICD-10-CM | POA: Diagnosis not present

## 2014-03-25 DIAGNOSIS — D2221 Melanocytic nevi of right ear and external auricular canal: Secondary | ICD-10-CM | POA: Diagnosis not present

## 2014-03-25 MED ORDER — LEVOTHYROXINE SODIUM 150 MCG PO TABS: 150.0000 ug | ORAL_TABLET | Freq: Every day | ORAL | Status: DC

## 2014-03-26 DIAGNOSIS — D3132 Benign neoplasm of left choroid: Secondary | ICD-10-CM | POA: Diagnosis not present

## 2014-03-26 DIAGNOSIS — H26493 Other secondary cataract, bilateral: Secondary | ICD-10-CM | POA: Diagnosis not present

## 2014-04-25 DIAGNOSIS — Z029 Encounter for administrative examinations, unspecified: Secondary | ICD-10-CM | POA: Diagnosis not present

## 2014-05-01 DIAGNOSIS — Z6827 Body mass index (BMI) 27.0-27.9, adult: Secondary | ICD-10-CM | POA: Diagnosis not present

## 2014-05-01 DIAGNOSIS — M545 Low back pain: Secondary | ICD-10-CM | POA: Diagnosis not present

## 2014-05-01 DIAGNOSIS — M4310 Spondylolisthesis, site unspecified: Secondary | ICD-10-CM | POA: Diagnosis not present

## 2014-05-02 DIAGNOSIS — M65871 Other synovitis and tenosynovitis, right ankle and foot: Secondary | ICD-10-CM | POA: Diagnosis not present

## 2014-05-02 DIAGNOSIS — L603 Nail dystrophy: Secondary | ICD-10-CM | POA: Diagnosis not present

## 2014-05-02 DIAGNOSIS — M722 Plantar fascial fibromatosis: Secondary | ICD-10-CM | POA: Diagnosis not present

## 2014-05-02 DIAGNOSIS — I739 Peripheral vascular disease, unspecified: Secondary | ICD-10-CM | POA: Diagnosis not present

## 2014-05-02 DIAGNOSIS — E1151 Type 2 diabetes mellitus with diabetic peripheral angiopathy without gangrene: Secondary | ICD-10-CM | POA: Diagnosis not present

## 2014-05-16 ENCOUNTER — Other Ambulatory Visit: Payer: Self-pay | Admitting: Family Medicine

## 2014-05-16 NOTE — Telephone Encounter (Signed)
Med filled.  

## 2014-06-14 DIAGNOSIS — M5136 Other intervertebral disc degeneration, lumbar region: Secondary | ICD-10-CM | POA: Diagnosis not present

## 2014-06-14 DIAGNOSIS — M170 Bilateral primary osteoarthritis of knee: Secondary | ICD-10-CM | POA: Diagnosis not present

## 2014-06-14 DIAGNOSIS — M19041 Primary osteoarthritis, right hand: Secondary | ICD-10-CM | POA: Diagnosis not present

## 2014-06-14 DIAGNOSIS — M545 Low back pain: Secondary | ICD-10-CM | POA: Diagnosis not present

## 2014-07-18 DIAGNOSIS — I739 Peripheral vascular disease, unspecified: Secondary | ICD-10-CM | POA: Diagnosis not present

## 2014-07-18 DIAGNOSIS — L603 Nail dystrophy: Secondary | ICD-10-CM | POA: Diagnosis not present

## 2014-07-18 DIAGNOSIS — E1151 Type 2 diabetes mellitus with diabetic peripheral angiopathy without gangrene: Secondary | ICD-10-CM | POA: Diagnosis not present

## 2014-07-18 DIAGNOSIS — L84 Corns and callosities: Secondary | ICD-10-CM | POA: Diagnosis not present

## 2014-08-16 DIAGNOSIS — Z79899 Other long term (current) drug therapy: Secondary | ICD-10-CM | POA: Diagnosis not present

## 2014-08-16 DIAGNOSIS — Z131 Encounter for screening for diabetes mellitus: Secondary | ICD-10-CM | POA: Diagnosis not present

## 2014-08-16 LAB — CBC AND DIFFERENTIAL
HCT: 40 % — AB (ref 41–53)
Hemoglobin: 13.3 g/dL — AB (ref 13.5–17.5)
Platelets: 235 10*3/uL (ref 150–399)
WBC: 4.5 10^3/mL

## 2014-08-16 LAB — BASIC METABOLIC PANEL
BUN: 16 mg/dL (ref 4–21)
Creatinine: 1 mg/dL (ref <=1.3)
Glucose: 137 mg/dL
Potassium: 5.4 mmol/L — AB (ref 3.4–5.3)
Sodium: 134 mmol/L — AB (ref 137–147)

## 2014-08-16 LAB — HEPATIC FUNCTION PANEL
ALT: 17 U/L (ref 10–40)
AST: 19 U/L (ref 14–40)
Alkaline Phosphatase: 70 U/L (ref 25–125)
Bilirubin, Total: 0.5 mg/dL

## 2014-08-16 LAB — HEMOGLOBIN A1C: Hgb A1c MFr Bld: 6.7 % — AB (ref 4.0–6.0)

## 2014-08-27 ENCOUNTER — Encounter: Payer: Self-pay | Admitting: General Practice

## 2014-09-01 ENCOUNTER — Other Ambulatory Visit: Payer: Self-pay | Admitting: Family Medicine

## 2014-09-03 NOTE — Telephone Encounter (Signed)
Med filled.  

## 2014-09-12 ENCOUNTER — Encounter: Payer: Self-pay | Admitting: Internal Medicine

## 2014-09-12 ENCOUNTER — Ambulatory Visit (INDEPENDENT_AMBULATORY_CARE_PROVIDER_SITE_OTHER): Payer: Medicare Other | Admitting: Internal Medicine

## 2014-09-12 ENCOUNTER — Ambulatory Visit: Payer: Medicare Other | Admitting: Internal Medicine

## 2014-09-12 VITALS — BP 150/70 | HR 46 | Temp 97.5°F | Resp 18 | Wt 196.0 lb

## 2014-09-12 DIAGNOSIS — J0101 Acute recurrent maxillary sinusitis: Secondary | ICD-10-CM | POA: Diagnosis not present

## 2014-09-12 MED ORDER — AMOXICILLIN-POT CLAVULANATE 875-125 MG PO TABS: 1.0000 | ORAL_TABLET | Freq: Two times a day (BID) | ORAL | Status: DC

## 2014-09-12 NOTE — Patient Instructions (Signed)

## 2014-09-12 NOTE — Progress Notes (Signed)
Pre visit review using our clinic review tool, if applicable. No additional management support is needed unless otherwise documented below in the visit note. 

## 2014-09-12 NOTE — Progress Notes (Signed)
   Subjective:    Patient ID: Kyle Simon, male    DOB: May 27, 1932, 81 y.o.   MRN: [id number]  HPI  His symptoms began 08/30/14 as fatigue, fever, watery eyes, and rhinitis. The symptoms lasted through the weekend  & he was bedridden 7/3. He started using NyQuil and DayQuil with some relief until relapse 7/8. The watery eyes and rhinitis returned and was accompanied by green nasal discharge. He's also had some dental pain  in the right maxillary teeth.  Review of Systems He denies frontal headache, cough, or sputum production. There is no associated shortness of breath or wheezing.     Objective:   Physical Exam  General appearance: Pattern alopecia is present. Adequately nourished; no acute distress or increased work of breathing is present.    Lymphatic: No  lymphadenopathy about the head, neck, or axilla .  Eyes: No conjunctival inflammation or lid edema is present. There is no scleral icterus.  Ears:  External ear exam shows no significant lesions or deformities.  Otoscopic examination reveals clear canals, tympanic membranes are intact bilaterally without bulging, retraction, inflammation or discharge. Left tympanic membrane is scarred.  Nose:  External nasal examination shows no deformity or inflammation. Nasal mucosa are pink and moist without lesions or exudates No septal dislocation or deviation.No obstruction to airflow.   Oral exam: Dental hygiene is good; lips and gums are healthy appearing.There is no oropharyngeal erythema or exudate. Punctate tattoo posterior pharynx.  Neck:  No deformities, thyromegaly, masses, or tenderness noted.   Supple with full range of motion without pain.   Heart:  Slow rate and regular rhythm. S1 and S2 normal without gallop, murmur, click, rub or other extra sounds.   Lungs:Chest clear to auscultation; no wheezes, rhonchi,rales ,or rubs present.  Extremities:  No cyanosis, edema, or clubbing  noted    Skin: Warm & dry w/o tenting or  jaundice. No significant lesions or rash.       Assessment & Plan:  #1 rhinosinusitis without significant bronchitis  Plan: Nasal hygiene interventions discussed. See prescription medications

## 2014-09-19 ENCOUNTER — Encounter: Payer: Medicare Other | Admitting: Family Medicine

## 2014-09-23 DIAGNOSIS — L570 Actinic keratosis: Secondary | ICD-10-CM | POA: Diagnosis not present

## 2014-09-25 ENCOUNTER — Telehealth: Payer: Self-pay | Admitting: Family Medicine

## 2014-09-25 ENCOUNTER — Other Ambulatory Visit: Payer: Self-pay | Admitting: Emergency Medicine

## 2014-09-25 MED ORDER — CEFUROXIME AXETIL 250 MG PO TABS: 500.0000 mg | ORAL_TABLET | Freq: Two times a day (BID) | ORAL | Status: DC

## 2014-09-25 NOTE — Telephone Encounter (Signed)
Please advise 

## 2014-09-25 NOTE — Telephone Encounter (Signed)
Cefuroxime 500 mg twice a day #10. Office visit if no better

## 2014-09-25 NOTE — Telephone Encounter (Signed)
Patient was in to see Dr. Linna Darner on 7/14 for sinus infection. He is still experiencing pretty bad drainage and pressure on one side of face and teeth.  Patient was wondering what to do now. Maybe more antibiotics? Please advise patient Pharmacy is [facility] on [address].

## 2014-09-26 DIAGNOSIS — I739 Peripheral vascular disease, unspecified: Secondary | ICD-10-CM | POA: Diagnosis not present

## 2014-09-26 DIAGNOSIS — L603 Nail dystrophy: Secondary | ICD-10-CM | POA: Diagnosis not present

## 2014-09-26 DIAGNOSIS — E1151 Type 2 diabetes mellitus with diabetic peripheral angiopathy without gangrene: Secondary | ICD-10-CM | POA: Diagnosis not present

## 2014-09-30 DIAGNOSIS — M25511 Pain in right shoulder: Secondary | ICD-10-CM | POA: Diagnosis not present

## 2014-10-01 DIAGNOSIS — M25511 Pain in right shoulder: Secondary | ICD-10-CM | POA: Diagnosis not present

## 2014-10-01 DIAGNOSIS — M5137 Other intervertebral disc degeneration, lumbosacral region: Secondary | ICD-10-CM | POA: Diagnosis not present

## 2014-10-01 DIAGNOSIS — M19041 Primary osteoarthritis, right hand: Secondary | ICD-10-CM | POA: Diagnosis not present

## 2014-10-08 DIAGNOSIS — M25511 Pain in right shoulder: Secondary | ICD-10-CM | POA: Diagnosis not present

## 2014-10-14 DIAGNOSIS — M25511 Pain in right shoulder: Secondary | ICD-10-CM | POA: Diagnosis not present

## 2014-10-18 DIAGNOSIS — M25511 Pain in right shoulder: Secondary | ICD-10-CM | POA: Diagnosis not present

## 2014-10-22 DIAGNOSIS — M25511 Pain in right shoulder: Secondary | ICD-10-CM | POA: Diagnosis not present

## 2014-10-24 DIAGNOSIS — M25511 Pain in right shoulder: Secondary | ICD-10-CM | POA: Diagnosis not present

## 2014-10-25 DIAGNOSIS — R3912 Poor urinary stream: Secondary | ICD-10-CM | POA: Diagnosis not present

## 2014-10-25 DIAGNOSIS — N401 Enlarged prostate with lower urinary tract symptoms: Secondary | ICD-10-CM | POA: Diagnosis not present

## 2014-10-29 DIAGNOSIS — M25511 Pain in right shoulder: Secondary | ICD-10-CM | POA: Diagnosis not present

## 2014-10-31 DIAGNOSIS — M25511 Pain in right shoulder: Secondary | ICD-10-CM | POA: Diagnosis not present

## 2014-10-31 LAB — HM DIABETES EYE EXAM

## 2014-11-01 ENCOUNTER — Ambulatory Visit (INDEPENDENT_AMBULATORY_CARE_PROVIDER_SITE_OTHER)
Admission: RE | Admit: 2014-11-01 | Discharge: 2014-11-01 | Disposition: A | Discharge status: Home / Self Care | Payer: Medicare Other | Source: Ambulatory Visit | Attending: Internal Medicine | Admitting: Internal Medicine

## 2014-11-01 ENCOUNTER — Encounter: Payer: Self-pay | Admitting: Internal Medicine

## 2014-11-01 ENCOUNTER — Ambulatory Visit (INDEPENDENT_AMBULATORY_CARE_PROVIDER_SITE_OTHER): Payer: Medicare Other | Admitting: Internal Medicine

## 2014-11-01 VITALS — BP 138/78 | HR 88 | Temp 97.8°F | Resp 16 | Ht 70.5 in | Wt 196.0 lb

## 2014-11-01 DIAGNOSIS — J310 Chronic rhinitis: Secondary | ICD-10-CM | POA: Diagnosis not present

## 2014-11-01 DIAGNOSIS — R030 Elevated blood-pressure reading, without diagnosis of hypertension: Secondary | ICD-10-CM | POA: Diagnosis not present

## 2014-11-01 DIAGNOSIS — E119 Type 2 diabetes mellitus without complications: Secondary | ICD-10-CM

## 2014-11-01 DIAGNOSIS — M25511 Pain in right shoulder: Secondary | ICD-10-CM | POA: Diagnosis not present

## 2014-11-01 DIAGNOSIS — J3489 Other specified disorders of nose and nasal sinuses: Secondary | ICD-10-CM | POA: Diagnosis not present

## 2014-11-01 MED ORDER — LOSARTAN POTASSIUM 50 MG PO TABS: 25.0000 mg | ORAL_TABLET | ORAL | Status: DC

## 2014-11-01 NOTE — Progress Notes (Signed)
Pre visit review using our clinic review tool, if applicable. No additional management support is needed unless otherwise documented below in the visit note. 

## 2014-11-01 NOTE — Patient Instructions (Signed)
Minimal Blood Pressure Goal= AVERAGE < 140/90;  Ideal is an AVERAGE < 135/85. This AVERAGE should be calculated from @ least 5-7 BP readings taken @ different times of day on different days of week. You should not respond to isolated BP readings , but rather the AVERAGE for that week .Please bring your  blood pressure cuff to office visits to verify that it is reliable.It  can also be checked against the blood pressure device at the pharmacy. Finger or wrist cuffs are not dependable; an arm cuff is. Your next office appointment will be determined based upon review of your pending  xrays  Those written interpretation of the lab results and instructions will be transmitted to you by My Chart Followup as needed for any active or acute issue. Please report any significant change in your symptoms.  Plain Mucinex (NOT D) for thick secretions ;force NON dairy fluids .   Nasal cleansing in the shower as discussed with lather of mild shampoo.After 10 seconds wash off lather while  exhaling through nostrils. Make sure that all residual soap is removed to prevent irritation.  Fluticasone 1 spray in each nostril twice a day as needed. Use the "crossover" technique into opposite nostril spraying toward opposite ear @ 45 degree angle, not straight up into nostril.  Use a Neti pot daily only  as needed for significant sinus congestion; going from open side to congested side . Plain Allegra (NOT D )  160 daily , Loratidine 10 mg , OR Zyrtec 10 mg @ bedtime  as needed for itchy eyes & sneezing.

## 2014-11-01 NOTE — Progress Notes (Signed)
   Subjective:    Patient ID: Kyle Simon, male    DOB: [birth year], 81 y.o.   MRN: [id number]  HPI The patient is here to assess status of active health conditions.  PMH, FH, & Social History reviewed & updated.No change in [place] as recorded.  He has decreased his losartan 50 mg to one half pill every other day. This was prescribed daily as prophylaxis because of his diabetes.  He is on a modified heart healthy diet with sodium restriction. He typically walks 15 miles per week with no cardio pulmonary symptoms. His blood pressure averages 140/70.  Glucoses range from 104-121. He has no hypoglycemia. His A1c was 6.7% in June of this year. Ophthalmologic exam is up-to-date.  He continues to have scant purulent postnasal drainage which is gray/green despite 2 courses of antibiotic. He has a remote history of sinus surgery.  He is being followed by his Urologist for nocturia 3-4 times per night. PSA is current.    Review of Systems  Frontal headache, facial pain , dental pain, sore throat , otic pain or otic discharge denied. No fever , chills or sweats. Chest pain, palpitations, tachycardia, exertional dyspnea, paroxysmal nocturnal dyspnea, claudication or edema are absent. No unexplained weight loss, abdominal pain, significant dyspepsia, dysphagia, melena, rectal bleeding, or persistently small caliber stools. Dysuria, pyuria, hematuria, frequency, or polyuria are denied. Change in hair, skin, nails denied. No bowel changes of constipation or diarrhea. No intolerance to heat or cold. He denies numbness, tingling, or burning in extremities. He has no nonhealing skin lesions.    Objective:   Physical Exam  Pertinent or positive findings include: Pattern alopecia is present. Pupils are pinpoint. The lower lids are prominent. There is postoperative asymmetric change of the oropharynx with a punctate ink tattoo. 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 He has  crepitus of the knees. He  has marked varicose veins of the lower extremities.  General appearance :adequately nourished; in no distress.  Eyes: No conjunctival inflammation or scleral icterus is present.  Oral exam:  Lips and gums are healthy appearing.There is no oropharyngeal erythema or exudate noted. Dental hygiene is good.  Heart:  Normal rate and regular rhythm. S1 and S2 normal without gallop, murmur, click, rub or other extra sounds    Lungs:Chest clear to auscultation; no wheezes, rhonchi,rales ,or rubs present.No increased work of breathing.   Abdomen: bowel sounds normal, soft and non-tender without masses, organomegaly or hernias noted.  No guarding or rebound.   Vascular : all pulses equal ; no bruits present.  Skin:Warm & dry.  Intact without suspicious lesions or rashes ; no tenting or jaundice   Lymphatic: No lymphadenopathy is noted about the head, neck, axilla   Neuro: Strength, tone & DTRs normal.     Assessment & Plan:  #1 elevated blood pressure without diagnosis of hypertension. He is on the angiotensin receptor blocker prophylactically because of his diabetes.  #2 diabetes, excellent control without complications  #3 chronic rhinitis; rule out chronic sinusitis  #4 pinpoint pupils; he has been asked to follow-up with his Ophthalmologist to rule out glaucoma risk  Plan: See orders recommendations

## 2014-11-05 ENCOUNTER — Other Ambulatory Visit: Payer: Self-pay | Admitting: Emergency Medicine

## 2014-11-05 MED ORDER — LEVOFLOXACIN 500 MG PO TABS: 500.0000 mg | ORAL_TABLET | Freq: Every day | ORAL | Status: DC

## 2014-11-14 ENCOUNTER — Telehealth: Payer: Self-pay | Admitting: Internal Medicine

## 2014-11-14 ENCOUNTER — Other Ambulatory Visit: Payer: Self-pay | Admitting: Internal Medicine

## 2014-11-14 DIAGNOSIS — J310 Chronic rhinitis: Secondary | ICD-10-CM

## 2014-11-14 NOTE — Telephone Encounter (Signed)
Order entered

## 2014-11-14 NOTE — Telephone Encounter (Signed)
Please advise 

## 2014-11-14 NOTE — Telephone Encounter (Signed)
Patient called in re: mychart m,essage for imaging results. He is ready for referral to ENT. He states that the problem has progressed to worse than when we saw him/

## 2014-11-15 NOTE — Telephone Encounter (Signed)
LVM informing pt of referral.

## 2014-11-21 ENCOUNTER — Encounter: Payer: Medicare Other | Admitting: Family Medicine

## 2014-11-25 DIAGNOSIS — J320 Chronic maxillary sinusitis: Secondary | ICD-10-CM | POA: Diagnosis not present

## 2014-11-26 DIAGNOSIS — Z9849 Cataract extraction status, unspecified eye: Secondary | ICD-10-CM | POA: Diagnosis not present

## 2014-11-26 DIAGNOSIS — Z961 Presence of intraocular lens: Secondary | ICD-10-CM | POA: Diagnosis not present

## 2014-11-26 DIAGNOSIS — I10 Essential (primary) hypertension: Secondary | ICD-10-CM | POA: Diagnosis not present

## 2014-11-26 DIAGNOSIS — H40022 Open angle with borderline findings, high risk, left eye: Secondary | ICD-10-CM | POA: Diagnosis not present

## 2014-11-26 DIAGNOSIS — H35373 Puckering of macula, bilateral: Secondary | ICD-10-CM | POA: Diagnosis not present

## 2014-11-26 DIAGNOSIS — H35033 Hypertensive retinopathy, bilateral: Secondary | ICD-10-CM | POA: Diagnosis not present

## 2014-11-26 DIAGNOSIS — E11329 Type 2 diabetes mellitus with mild nonproliferative diabetic retinopathy without macular edema: Secondary | ICD-10-CM | POA: Diagnosis not present

## 2014-11-26 DIAGNOSIS — H40012 Open angle with borderline findings, low risk, left eye: Secondary | ICD-10-CM | POA: Diagnosis not present

## 2014-11-26 DIAGNOSIS — H40002 Preglaucoma, unspecified, left eye: Secondary | ICD-10-CM | POA: Diagnosis not present

## 2014-11-26 DIAGNOSIS — H18413 Arcus senilis, bilateral: Secondary | ICD-10-CM | POA: Diagnosis not present

## 2014-11-26 DIAGNOSIS — M25511 Pain in right shoulder: Secondary | ICD-10-CM | POA: Diagnosis not present

## 2014-11-26 DIAGNOSIS — H11153 Pinguecula, bilateral: Secondary | ICD-10-CM | POA: Diagnosis not present

## 2014-11-26 DIAGNOSIS — E11319 Type 2 diabetes mellitus with unspecified diabetic retinopathy without macular edema: Secondary | ICD-10-CM | POA: Diagnosis not present

## 2014-11-28 ENCOUNTER — Other Ambulatory Visit: Payer: Self-pay | Admitting: Orthopedic Surgery

## 2014-11-28 DIAGNOSIS — M25511 Pain in right shoulder: Secondary | ICD-10-CM

## 2014-11-29 ENCOUNTER — Ambulatory Visit (HOSPITAL_COMMUNITY)
Admission: RE | Admit: 2014-11-29 | Discharge: 2014-11-29 | Disposition: A | Discharge status: Home / Self Care | Payer: Medicare Other | Source: Ambulatory Visit | Attending: Orthopedic Surgery | Admitting: Orthopedic Surgery

## 2014-11-29 DIAGNOSIS — R339 Retention of urine, unspecified: Secondary | ICD-10-CM | POA: Diagnosis not present

## 2014-11-29 DIAGNOSIS — M75121 Complete rotator cuff tear or rupture of right shoulder, not specified as traumatic: Secondary | ICD-10-CM | POA: Diagnosis not present

## 2014-11-29 DIAGNOSIS — R3912 Poor urinary stream: Secondary | ICD-10-CM | POA: Diagnosis not present

## 2014-11-29 DIAGNOSIS — M25511 Pain in right shoulder: Secondary | ICD-10-CM

## 2014-11-29 DIAGNOSIS — M7591 Shoulder lesion, unspecified, right shoulder: Secondary | ICD-10-CM | POA: Diagnosis not present

## 2014-11-30 ENCOUNTER — Other Ambulatory Visit: Payer: Self-pay | Admitting: Family Medicine

## 2014-11-30 HISTORY — PX: ROTATOR CUFF REPAIR: SHX139

## 2014-12-03 DIAGNOSIS — M75121 Complete rotator cuff tear or rupture of right shoulder, not specified as traumatic: Secondary | ICD-10-CM | POA: Insufficient documentation

## 2014-12-03 DIAGNOSIS — M7541 Impingement syndrome of right shoulder: Secondary | ICD-10-CM | POA: Diagnosis not present

## 2014-12-04 DIAGNOSIS — E119 Type 2 diabetes mellitus without complications: Secondary | ICD-10-CM | POA: Diagnosis not present

## 2014-12-04 DIAGNOSIS — H11153 Pinguecula, bilateral: Secondary | ICD-10-CM | POA: Diagnosis not present

## 2014-12-04 DIAGNOSIS — H18413 Arcus senilis, bilateral: Secondary | ICD-10-CM | POA: Diagnosis not present

## 2014-12-04 DIAGNOSIS — H04123 Dry eye syndrome of bilateral lacrimal glands: Secondary | ICD-10-CM | POA: Diagnosis not present

## 2014-12-04 DIAGNOSIS — Z961 Presence of intraocular lens: Secondary | ICD-10-CM | POA: Diagnosis not present

## 2014-12-04 DIAGNOSIS — H40022 Open angle with borderline findings, high risk, left eye: Secondary | ICD-10-CM | POA: Diagnosis not present

## 2014-12-16 ENCOUNTER — Other Ambulatory Visit: Payer: Medicare Other

## 2014-12-25 DIAGNOSIS — M66811 Spontaneous rupture of other tendons, right shoulder: Secondary | ICD-10-CM | POA: Diagnosis not present

## 2014-12-25 DIAGNOSIS — G8918 Other acute postprocedural pain: Secondary | ICD-10-CM | POA: Diagnosis not present

## 2014-12-25 DIAGNOSIS — M7541 Impingement syndrome of right shoulder: Secondary | ICD-10-CM | POA: Diagnosis not present

## 2014-12-25 DIAGNOSIS — M75111 Incomplete rotator cuff tear or rupture of right shoulder, not specified as traumatic: Secondary | ICD-10-CM | POA: Diagnosis not present

## 2014-12-25 DIAGNOSIS — M24111 Other articular cartilage disorders, right shoulder: Secondary | ICD-10-CM | POA: Diagnosis not present

## 2014-12-25 DIAGNOSIS — M19011 Primary osteoarthritis, right shoulder: Secondary | ICD-10-CM | POA: Diagnosis not present

## 2014-12-30 DIAGNOSIS — N401 Enlarged prostate with lower urinary tract symptoms: Secondary | ICD-10-CM | POA: Diagnosis not present

## 2014-12-30 DIAGNOSIS — N138 Other obstructive and reflux uropathy: Secondary | ICD-10-CM | POA: Diagnosis not present

## 2014-12-31 DIAGNOSIS — M25511 Pain in right shoulder: Secondary | ICD-10-CM | POA: Diagnosis not present

## 2015-01-01 DIAGNOSIS — M25511 Pain in right shoulder: Secondary | ICD-10-CM | POA: Diagnosis not present

## 2015-01-02 DIAGNOSIS — E1151 Type 2 diabetes mellitus with diabetic peripheral angiopathy without gangrene: Secondary | ICD-10-CM | POA: Diagnosis not present

## 2015-01-02 DIAGNOSIS — L603 Nail dystrophy: Secondary | ICD-10-CM | POA: Diagnosis not present

## 2015-01-02 DIAGNOSIS — I739 Peripheral vascular disease, unspecified: Secondary | ICD-10-CM | POA: Diagnosis not present

## 2015-01-03 DIAGNOSIS — M25511 Pain in right shoulder: Secondary | ICD-10-CM | POA: Diagnosis not present

## 2015-01-06 DIAGNOSIS — J320 Chronic maxillary sinusitis: Secondary | ICD-10-CM | POA: Diagnosis not present

## 2015-01-07 DIAGNOSIS — M25511 Pain in right shoulder: Secondary | ICD-10-CM | POA: Diagnosis not present

## 2015-01-10 DIAGNOSIS — M25511 Pain in right shoulder: Secondary | ICD-10-CM | POA: Diagnosis not present

## 2015-01-13 DIAGNOSIS — L570 Actinic keratosis: Secondary | ICD-10-CM | POA: Diagnosis not present

## 2015-01-14 DIAGNOSIS — M25511 Pain in right shoulder: Secondary | ICD-10-CM | POA: Diagnosis not present

## 2015-01-15 DIAGNOSIS — M25511 Pain in right shoulder: Secondary | ICD-10-CM | POA: Diagnosis not present

## 2015-01-15 DIAGNOSIS — M19041 Primary osteoarthritis, right hand: Secondary | ICD-10-CM | POA: Diagnosis not present

## 2015-01-15 DIAGNOSIS — M1711 Unilateral primary osteoarthritis, right knee: Secondary | ICD-10-CM | POA: Diagnosis not present

## 2015-01-15 DIAGNOSIS — M5137 Other intervertebral disc degeneration, lumbosacral region: Secondary | ICD-10-CM | POA: Diagnosis not present

## 2015-01-17 DIAGNOSIS — M25511 Pain in right shoulder: Secondary | ICD-10-CM | POA: Diagnosis not present

## 2015-01-20 DIAGNOSIS — M25511 Pain in right shoulder: Secondary | ICD-10-CM | POA: Diagnosis not present

## 2015-01-28 DIAGNOSIS — M25511 Pain in right shoulder: Secondary | ICD-10-CM | POA: Diagnosis not present

## 2015-01-30 DIAGNOSIS — M25511 Pain in right shoulder: Secondary | ICD-10-CM | POA: Diagnosis not present

## 2015-01-31 DIAGNOSIS — M25511 Pain in right shoulder: Secondary | ICD-10-CM | POA: Diagnosis not present

## 2015-02-03 DIAGNOSIS — M25511 Pain in right shoulder: Secondary | ICD-10-CM | POA: Diagnosis not present

## 2015-02-06 DIAGNOSIS — M25511 Pain in right shoulder: Secondary | ICD-10-CM | POA: Diagnosis not present

## 2015-02-10 DIAGNOSIS — L570 Actinic keratosis: Secondary | ICD-10-CM | POA: Diagnosis not present

## 2015-02-11 DIAGNOSIS — M25511 Pain in right shoulder: Secondary | ICD-10-CM | POA: Diagnosis not present

## 2015-02-14 DIAGNOSIS — M25511 Pain in right shoulder: Secondary | ICD-10-CM | POA: Diagnosis not present

## 2015-02-18 DIAGNOSIS — M25511 Pain in right shoulder: Secondary | ICD-10-CM | POA: Diagnosis not present

## 2015-02-21 DIAGNOSIS — M25511 Pain in right shoulder: Secondary | ICD-10-CM | POA: Diagnosis not present

## 2015-02-25 DIAGNOSIS — M25511 Pain in right shoulder: Secondary | ICD-10-CM | POA: Diagnosis not present

## 2015-02-28 ENCOUNTER — Other Ambulatory Visit: Payer: Self-pay | Admitting: Family Medicine

## 2015-02-28 NOTE — Telephone Encounter (Signed)
Medication filled to pharmacy as requested.   

## 2015-03-02 DIAGNOSIS — M25511 Pain in right shoulder: Secondary | ICD-10-CM | POA: Diagnosis not present

## 2015-03-07 DIAGNOSIS — M25511 Pain in right shoulder: Secondary | ICD-10-CM | POA: Diagnosis not present

## 2015-03-12 ENCOUNTER — Telehealth: Payer: Self-pay | Admitting: Family Medicine

## 2015-03-12 DIAGNOSIS — M25511 Pain in right shoulder: Secondary | ICD-10-CM | POA: Diagnosis not present

## 2015-03-12 NOTE — Telephone Encounter (Signed)
Scheduled check up with Dr. Birdie Riddle next week since there were no 30 min slots (or spots to combine 2-15 in the near future) with Dr. Larose Kells.

## 2015-03-12 NOTE — Telephone Encounter (Signed)
Pt is wanting to change to Dr. Larose Kells from Dr. Birdie Riddle since he cannot go to [facility] (Previously pt of Dr. Linna Darner). His wife is seeing Dr. Larose Kells (June [facility]ostro).   Is change ok?

## 2015-03-12 NOTE — Telephone Encounter (Signed)
That is okay,   please schedule a routine checkup soon.

## 2015-03-12 NOTE — Telephone Encounter (Signed)
Ok to switch 

## 2015-03-14 DIAGNOSIS — M25511 Pain in right shoulder: Secondary | ICD-10-CM | POA: Diagnosis not present

## 2015-03-18 DIAGNOSIS — M25511 Pain in right shoulder: Secondary | ICD-10-CM | POA: Diagnosis not present

## 2015-03-19 DIAGNOSIS — E1151 Type 2 diabetes mellitus with diabetic peripheral angiopathy without gangrene: Secondary | ICD-10-CM | POA: Diagnosis not present

## 2015-03-19 DIAGNOSIS — I739 Peripheral vascular disease, unspecified: Secondary | ICD-10-CM | POA: Diagnosis not present

## 2015-03-19 DIAGNOSIS — L603 Nail dystrophy: Secondary | ICD-10-CM | POA: Diagnosis not present

## 2015-03-19 LAB — HM DIABETES FOOT EXAM: HM Diabetic Foot Exam: NORMAL

## 2015-03-20 ENCOUNTER — Ambulatory Visit: Payer: Medicare Other | Admitting: Family Medicine

## 2015-03-21 ENCOUNTER — Encounter: Payer: Self-pay | Admitting: Family Medicine

## 2015-03-21 ENCOUNTER — Ambulatory Visit (INDEPENDENT_AMBULATORY_CARE_PROVIDER_SITE_OTHER): Payer: Medicare Other | Admitting: Family Medicine

## 2015-03-21 VITALS — BP 130/75 | HR 62 | Temp 97.9°F | Resp 16 | Ht 71.0 in | Wt 195.6 lb

## 2015-03-21 DIAGNOSIS — M25511 Pain in right shoulder: Secondary | ICD-10-CM | POA: Diagnosis not present

## 2015-03-21 DIAGNOSIS — E785 Hyperlipidemia, unspecified: Secondary | ICD-10-CM

## 2015-03-21 DIAGNOSIS — I10 Essential (primary) hypertension: Secondary | ICD-10-CM

## 2015-03-21 DIAGNOSIS — E119 Type 2 diabetes mellitus without complications: Secondary | ICD-10-CM

## 2015-03-21 LAB — HEPATIC FUNCTION PANEL
ALT: 16 U/L (ref 9–46)
AST: 19 U/L (ref 10–35)
Albumin: 4.2 g/dL (ref 3.6–5.1)
Alkaline Phosphatase: 68 U/L (ref 40–115)
Bilirubin, Direct: 0.1 mg/dL (ref <=0.2)
Indirect Bilirubin: 0.4 mg/dL (ref 0.2–1.2)
Total Bilirubin: 0.5 mg/dL (ref 0.2–1.2)
Total Protein: 7 g/dL (ref 6.1–8.1)

## 2015-03-21 LAB — BASIC METABOLIC PANEL
BUN: 22 mg/dL (ref 7–25)
CO2: 25 mmol/L (ref 20–31)
Calcium: 9.3 mg/dL (ref 8.6–10.3)
Chloride: 97 mmol/L — ABNORMAL LOW (ref 98–110)
Creat: 1.02 mg/dL (ref 0.70–1.11)
Glucose, Bld: 123 mg/dL — ABNORMAL HIGH (ref 65–99)
Potassium: 5.1 mmol/L (ref 3.5–5.3)
Sodium: 130 mmol/L — ABNORMAL LOW (ref 135–146)

## 2015-03-21 LAB — CBC WITH DIFFERENTIAL/PLATELET
Basophils Absolute: 0 10*3/uL (ref 0.0–0.1)
Basophils Relative: 0 % (ref 0–1)
Eosinophils Absolute: 0.1 10*3/uL (ref 0.0–0.7)
Eosinophils Relative: 1 % (ref 0–5)
HCT: 38.2 % — ABNORMAL LOW (ref 39.0–52.0)
Hemoglobin: 12.9 g/dL — ABNORMAL LOW (ref 13.0–17.0)
Lymphocytes Relative: 23 % (ref 12–46)
Lymphs Abs: 1.4 10*3/uL (ref 0.7–4.0)
MCH: 30.6 pg (ref 26.0–34.0)
MCHC: 33.8 g/dL (ref 30.0–36.0)
MCV: 90.7 fL (ref 78.0–100.0)
MPV: 8.9 fL (ref 8.6–12.4)
Monocytes Absolute: 0.8 10*3/uL (ref 0.1–1.0)
Monocytes Relative: 12 % (ref 3–12)
Neutro Abs: 4 10*3/uL (ref 1.7–7.7)
Neutrophils Relative %: 64 % (ref 43–77)
Platelets: 244 10*3/uL (ref 150–400)
RBC: 4.21 MIL/uL — ABNORMAL LOW (ref 4.22–5.81)
RDW: 13.2 % (ref 11.5–15.5)
WBC: 6.3 10*3/uL (ref 4.0–10.5)

## 2015-03-21 LAB — LIPID PANEL
Cholesterol: 132 mg/dL (ref 125–200)
HDL: 41 mg/dL (ref >=40)
LDL Cholesterol: 67 mg/dL (ref <130)
Total CHOL/HDL Ratio: 3.2 Ratio (ref <=5.0)
Triglycerides: 121 mg/dL (ref <150)
VLDL: 24 mg/dL (ref <30)

## 2015-03-21 NOTE — Progress Notes (Signed)
   Subjective:    Patient ID: Kyle Simon, male    DOB: [birth year], 82 y.o.   MRN: [id number]  HPI HTN- chronic problem, on Losartan.  BP is well controlled today.  No CP, SOB, HAs, visual changes, edema.  Hyperlipidemia- chronic problem, on Crestor.  No abd pain, N/V.  DM- currently diet controlled.  On ARB for renal protection.  UTD on foot exam w/ Dr Casilda Carls (done 2 days ago, sees q3 months) and eye exam (done in Sept- [place]).  Home CBGs 114-125.  Denies symptomatic lows, no numbness/tingling of hands/feet.  Hypothyroid- chronic problem, on Levothyroxine.  Denies excessive fatigue, changes to skin/hair/nails   Review of Systems For ROS see HPI     Objective:   Physical Exam  Constitutional: He is oriented to person, place, and time. He appears well-developed and well-nourished. No distress.  HENT:  Head: Normocephalic and atraumatic.  Eyes: Conjunctivae and EOM are normal. Pupils are equal, round, and reactive to light.  Neck: Normal range of motion. Neck supple. No thyromegaly present.  Cardiovascular: Normal rate, regular rhythm, normal heart sounds and intact distal pulses.   No murmur heard. Pulmonary/Chest: Effort normal and breath sounds normal. No respiratory distress.  Abdominal: Soft. Bowel sounds are normal. He exhibits no distension.  Musculoskeletal: He exhibits no edema.  Lymphadenopathy:    He has no cervical adenopathy.  Neurological: He is alert and oriented to person, place, and time. No cranial nerve deficit.  Skin: Skin is warm and dry.  Psychiatric: He has a normal mood and affect. His behavior is normal.  Vitals reviewed.         Assessment & Plan:

## 2015-03-21 NOTE — Patient Instructions (Signed)
Schedule your complete physical in 6 months We'll notify you of your lab results and make any changes if needed Keep up the good work!  You look great! Call with any questions or concerns Happy New Year!!! 

## 2015-03-21 NOTE — Progress Notes (Signed)
Pre visit review using our clinic review tool, if applicable. No additional management support is needed unless otherwise documented below in the visit note. 

## 2015-03-22 LAB — HEMOGLOBIN A1C
Hgb A1c MFr Bld: 6.4 % — ABNORMAL HIGH (ref <5.7)
Mean Plasma Glucose: 137 mg/dL — ABNORMAL HIGH (ref <117)

## 2015-03-22 LAB — TSH: TSH: 0.859 u[IU]/mL (ref 0.350–4.500)

## 2015-03-23 NOTE — Assessment & Plan Note (Signed)
Chronic problem.  Tolerating statin w/o difficulty.  Check labs.  Adjust meds prn  

## 2015-03-23 NOTE — Assessment & Plan Note (Signed)
Ongoing for pt.  Asymptomatic.  Pt is UTD on eye exam, foot exam.  On ARB for renal protection.  Check labs.  Adjust tx plan prn.

## 2015-03-23 NOTE — Assessment & Plan Note (Signed)
Chronic problem.  Adequate control.  Asymptomatic.  Check labs.  Adjust meds prn. 

## 2015-03-25 DIAGNOSIS — M25511 Pain in right shoulder: Secondary | ICD-10-CM | POA: Diagnosis not present

## 2015-03-28 DIAGNOSIS — M25511 Pain in right shoulder: Secondary | ICD-10-CM | POA: Diagnosis not present

## 2015-04-01 DIAGNOSIS — M25511 Pain in right shoulder: Secondary | ICD-10-CM | POA: Diagnosis not present

## 2015-04-02 DIAGNOSIS — M25511 Pain in right shoulder: Secondary | ICD-10-CM | POA: Diagnosis not present

## 2015-04-04 DIAGNOSIS — M25511 Pain in right shoulder: Secondary | ICD-10-CM | POA: Diagnosis not present

## 2015-04-07 DIAGNOSIS — M25511 Pain in right shoulder: Secondary | ICD-10-CM | POA: Diagnosis not present

## 2015-04-15 DIAGNOSIS — M25511 Pain in right shoulder: Secondary | ICD-10-CM | POA: Diagnosis not present

## 2015-04-16 ENCOUNTER — Telehealth: Payer: Self-pay | Admitting: Family Medicine

## 2015-04-16 NOTE — Telephone Encounter (Signed)
Pt called in because he says that he is a diabetic. Pt says that his Tester for his machine died and he need a new one.   Pt is requesting a call back to discuss further.    CB: [phone]  Pharmacy: [facility]

## 2015-04-17 ENCOUNTER — Other Ambulatory Visit: Payer: Self-pay

## 2015-04-17 ENCOUNTER — Other Ambulatory Visit: Payer: Self-pay | Admitting: Family Medicine

## 2015-04-17 DIAGNOSIS — E139 Other specified diabetes mellitus without complications: Secondary | ICD-10-CM

## 2015-04-17 MED ORDER — GLUCOSE BLOOD VI STRP: ORAL_STRIP | Status: DC

## 2015-04-17 MED ORDER — NONFORMULARY OR COMPOUNDED ITEM: Status: DC

## 2015-04-17 NOTE — Telephone Encounter (Signed)
Called [facility], due to pt having Tricare and Medicare he has to contact his Medicare insurance about getting a replacement glucometer. [facility] will cover test strips only (preferred are the precision).

## 2015-04-18 DIAGNOSIS — M25511 Pain in right shoulder: Secondary | ICD-10-CM | POA: Diagnosis not present

## 2015-04-21 DIAGNOSIS — Z029 Encounter for administrative examinations, unspecified: Secondary | ICD-10-CM | POA: Diagnosis not present

## 2015-04-22 DIAGNOSIS — M25511 Pain in right shoulder: Secondary | ICD-10-CM | POA: Diagnosis not present

## 2015-04-30 DIAGNOSIS — M25511 Pain in right shoulder: Secondary | ICD-10-CM | POA: Diagnosis not present

## 2015-05-19 DIAGNOSIS — M810 Age-related osteoporosis without current pathological fracture: Secondary | ICD-10-CM | POA: Diagnosis not present

## 2015-05-19 DIAGNOSIS — M85832 Other specified disorders of bone density and structure, left forearm: Secondary | ICD-10-CM | POA: Diagnosis not present

## 2015-05-19 NOTE — Telephone Encounter (Signed)
Pt received 2 boxes of precision strips but he does not have a meter or lancets. Pt is stating that the old one quit working and he has been using his wife's meter. Please f/u with pt at [phone].  Pt now using local pharmacy [facility] [place], [address] - he would like to know what the cost is to him.  If he needs to change systems to get if from [facility] to be cheaper please order whatever they will cover.

## 2015-05-19 NOTE — Telephone Encounter (Signed)
Called and left a detailed message on answering machine for patient. Per [facility] he would have to either 1.) Contact Medicare to verify they can send him a new precision meter or 2.) contact [facility] to verify what meter is cheapest for him and then let us know at the office. I will send in which ever they advise is cheaper for the patient.

## 2015-05-20 DIAGNOSIS — R531 Weakness: Secondary | ICD-10-CM | POA: Diagnosis not present

## 2015-05-20 DIAGNOSIS — Z4889 Encounter for other specified surgical aftercare: Secondary | ICD-10-CM | POA: Diagnosis not present

## 2015-05-27 ENCOUNTER — Other Ambulatory Visit: Payer: Self-pay | Admitting: Family Medicine

## 2015-05-27 NOTE — Telephone Encounter (Signed)
Medication filled to pharmacy as requested.   

## 2015-05-28 DIAGNOSIS — M25511 Pain in right shoulder: Secondary | ICD-10-CM | POA: Diagnosis not present

## 2015-05-31 DIAGNOSIS — M25511 Pain in right shoulder: Secondary | ICD-10-CM | POA: Diagnosis not present

## 2015-06-04 DIAGNOSIS — L603 Nail dystrophy: Secondary | ICD-10-CM | POA: Diagnosis not present

## 2015-06-04 DIAGNOSIS — E1151 Type 2 diabetes mellitus with diabetic peripheral angiopathy without gangrene: Secondary | ICD-10-CM | POA: Diagnosis not present

## 2015-06-04 DIAGNOSIS — I739 Peripheral vascular disease, unspecified: Secondary | ICD-10-CM | POA: Diagnosis not present

## 2015-06-06 DIAGNOSIS — M25511 Pain in right shoulder: Secondary | ICD-10-CM | POA: Diagnosis not present

## 2015-06-17 DIAGNOSIS — M25511 Pain in right shoulder: Secondary | ICD-10-CM | POA: Diagnosis not present

## 2015-06-21 ENCOUNTER — Encounter: Payer: Self-pay | Admitting: Family Medicine

## 2015-06-21 ENCOUNTER — Ambulatory Visit (INDEPENDENT_AMBULATORY_CARE_PROVIDER_SITE_OTHER): Payer: Medicare Other | Admitting: Family Medicine

## 2015-06-21 VITALS — BP 132/82 | HR 49 | Temp 97.8°F | Ht 71.0 in | Wt 192.8 lb

## 2015-06-21 DIAGNOSIS — R197 Diarrhea, unspecified: Secondary | ICD-10-CM | POA: Insufficient documentation

## 2015-06-21 NOTE — Progress Notes (Signed)
   Subjective:    Patient ID: Kyle Simon, male    DOB: Jun 12, 1932, 82 y.o.   MRN: [id number]  HPI   82 year old male pt of Dr. Birdie Riddle with history of diet controlled diabetes, HTN, GERD presents with new onset change in bowel movements in last  4 weeks. gradually worsening  He feels well but has had intermittent  diarrhea  and increase gas, occ stool incontinence with passing gas. occ lumpy stool to liquidy stool. Daily BMs multiple times a day, no constipation.  No clear association with specific foods.  No abd pain, no blood in stool, no dark stool.  Recently on cruise 2/26-3/8, symptoms started before the cruise. No recent antibiotics.  No new meds other than increase use of tylenol, ibuprofen intermittently for shoulder pain after working out.  He has history of similar after antibiotics in past.. Resolved with probiotic.  2014 colonoscopy:nml.  Social History /Family History/Past Medical History reviewed and updated if needed.     Review of Systems  Constitutional: Negative for fever and fatigue.  HENT: Negative for ear pain.   Eyes: Negative for pain.  Respiratory: Negative for cough and shortness of breath.        Objective:   Physical Exam  Constitutional: Vital signs are normal. He appears well-developed and well-nourished.  HENT:  Head: Normocephalic.  Right Ear: Hearing normal.  Left Ear: Hearing normal.  Nose: Nose normal.  Mouth/Throat: Oropharynx is clear and moist and mucous membranes are normal.  Neck: Trachea normal. Carotid bruit is not present. No thyroid mass and no thyromegaly present.  Cardiovascular: Normal rate, regular rhythm and normal pulses.  Exam reveals no gallop, no distant heart sounds and no friction rub.   No murmur heard. No peripheral edema  Pulmonary/Chest: Effort normal and breath sounds normal. No respiratory distress.  Abdominal: Normal appearance and bowel sounds are normal. There is no hepatosplenomegaly. There is no  tenderness. There is no rigidity, no rebound, no guarding and no CVA tenderness.  Skin: Skin is warm, dry and intact. No rash noted.  Psychiatric: He has a normal mood and affect. His speech is normal and behavior is normal. Thought content normal.          Assessment & Plan:

## 2015-06-21 NOTE — Assessment & Plan Note (Signed)
No clear sign of infeciton, nml colonoscopy in 2014.  No red flags.  Likely functional diarrhea, IBS.  Try probiotic, increase fiber and water. Follow diet. If not improving follow up with PCP.

## 2015-06-21 NOTE — Patient Instructions (Addendum)
Increase fiber in diet to bulk up stool, increase water. Avoid greasy foods. Stop ibuprofen. Use tylenol for pain if needed. Start probiotic.. Such culturelle, align. Keep food diary, possibly avoid lactose containing foods if assocation.  If not improving in weeks... Follow up with PCP.

## 2015-06-21 NOTE — Progress Notes (Signed)
Pre visit review using our clinic review tool, if applicable. No additional management support is needed unless otherwise documented below in the visit note. 

## 2015-06-24 DIAGNOSIS — M25511 Pain in right shoulder: Secondary | ICD-10-CM | POA: Diagnosis not present

## 2015-06-30 DIAGNOSIS — M25511 Pain in right shoulder: Secondary | ICD-10-CM | POA: Diagnosis not present

## 2015-07-03 DIAGNOSIS — M25511 Pain in right shoulder: Secondary | ICD-10-CM | POA: Diagnosis not present

## 2015-07-04 DIAGNOSIS — N401 Enlarged prostate with lower urinary tract symptoms: Secondary | ICD-10-CM | POA: Diagnosis not present

## 2015-07-04 DIAGNOSIS — R3912 Poor urinary stream: Secondary | ICD-10-CM | POA: Diagnosis not present

## 2015-07-04 DIAGNOSIS — N138 Other obstructive and reflux uropathy: Secondary | ICD-10-CM | POA: Diagnosis not present

## 2015-07-04 DIAGNOSIS — Z0000 Encounter for general adult medical examination without abnormal findings: Secondary | ICD-10-CM | POA: Diagnosis not present

## 2015-07-08 DIAGNOSIS — M25511 Pain in right shoulder: Secondary | ICD-10-CM | POA: Diagnosis not present

## 2015-07-09 ENCOUNTER — Other Ambulatory Visit: Payer: Self-pay | Admitting: Internal Medicine

## 2015-07-09 NOTE — Telephone Encounter (Signed)
Routing to patient's new pcp to handle 

## 2015-07-15 DIAGNOSIS — M25511 Pain in right shoulder: Secondary | ICD-10-CM | POA: Diagnosis not present

## 2015-07-18 DIAGNOSIS — H40022 Open angle with borderline findings, high risk, left eye: Secondary | ICD-10-CM | POA: Diagnosis not present

## 2015-07-18 DIAGNOSIS — H11153 Pinguecula, bilateral: Secondary | ICD-10-CM | POA: Diagnosis not present

## 2015-07-18 DIAGNOSIS — H11423 Conjunctival edema, bilateral: Secondary | ICD-10-CM | POA: Diagnosis not present

## 2015-07-18 DIAGNOSIS — H04123 Dry eye syndrome of bilateral lacrimal glands: Secondary | ICD-10-CM | POA: Diagnosis not present

## 2015-07-18 DIAGNOSIS — H18413 Arcus senilis, bilateral: Secondary | ICD-10-CM | POA: Diagnosis not present

## 2015-07-18 DIAGNOSIS — Z961 Presence of intraocular lens: Secondary | ICD-10-CM | POA: Diagnosis not present

## 2015-07-18 DIAGNOSIS — H5340 Unspecified visual field defects: Secondary | ICD-10-CM | POA: Diagnosis not present

## 2015-07-18 DIAGNOSIS — Z9849 Cataract extraction status, unspecified eye: Secondary | ICD-10-CM | POA: Diagnosis not present

## 2015-07-22 DIAGNOSIS — Z9889 Other specified postprocedural states: Secondary | ICD-10-CM | POA: Diagnosis not present

## 2015-07-24 DIAGNOSIS — M25511 Pain in right shoulder: Secondary | ICD-10-CM | POA: Diagnosis not present

## 2015-07-25 ENCOUNTER — Encounter: Payer: Self-pay | Admitting: Family Medicine

## 2015-07-25 ENCOUNTER — Ambulatory Visit (INDEPENDENT_AMBULATORY_CARE_PROVIDER_SITE_OTHER): Payer: Medicare Other | Admitting: Family Medicine

## 2015-07-25 VITALS — BP 128/74 | HR 53 | Temp 97.7°F | Ht 70.5 in | Wt 193.6 lb

## 2015-07-25 DIAGNOSIS — N400 Enlarged prostate without lower urinary tract symptoms: Secondary | ICD-10-CM

## 2015-07-25 DIAGNOSIS — E876 Hypokalemia: Secondary | ICD-10-CM | POA: Diagnosis not present

## 2015-07-25 DIAGNOSIS — R143 Flatulence: Secondary | ICD-10-CM | POA: Diagnosis not present

## 2015-07-25 DIAGNOSIS — E119 Type 2 diabetes mellitus without complications: Secondary | ICD-10-CM

## 2015-07-25 DIAGNOSIS — E785 Hyperlipidemia, unspecified: Secondary | ICD-10-CM | POA: Diagnosis not present

## 2015-07-25 DIAGNOSIS — I10 Essential (primary) hypertension: Secondary | ICD-10-CM

## 2015-07-25 DIAGNOSIS — D519 Vitamin B12 deficiency anemia, unspecified: Secondary | ICD-10-CM | POA: Diagnosis not present

## 2015-07-25 DIAGNOSIS — E038 Other specified hypothyroidism: Secondary | ICD-10-CM

## 2015-07-25 MED ORDER — RESTORA PO CAPS: ORAL_CAPSULE | ORAL | Status: DC

## 2015-07-25 NOTE — Assessment & Plan Note (Addendum)
We will order labs and do stool cxs etc.    Pt will bring in stool sample next week and also make appt for fasting labs- for health maintenance as well.   Then pt will make f/up appt with me one week later to discuss results.   - Discussed with patient how to eat slower, avoid swallowing air. Patient does not have any dentures. Discussed being on an use simethicone when necessary. - We'll await labs and then discuss elimination diets with patient. Told him today to avoid all dairy for the next 2 weeks until seen in follow-up.

## 2015-07-25 NOTE — Progress Notes (Signed)
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   HPI: Kyle Simon is a pleasant 82 y.o. male who presents to [place] at [facility] today to become an established patient. PCP prior was Dr. Mable Paris who has since retired. He is here with his wife today.  Primary complaint:  2 years ago after taking antibiotics for dental procedure he has had "wet flatulence "on and off ever since. Increased frequency and urgency approximately 6 weeks ago.  He even passes gas in the middle of the night while sleeping and has stooled in his underpants which upsets him very much.  He passes a lot of air.  No burping or belching or oral symptoms at all. He had a colonoscopy just prior to the antibiotic usage approximately 2 years ago and is just always been normal..  He saw a physician at his PCPs office about one month ago for this and was told to start probiotics and patient was upset because no other testing was done at that time. Patient tells me today it is been some time since he's had lab work..   Past Medical History  Diagnosis Date  . Hyperlipidemia   . Snoring     no sleep apnea  . Polio     as a child w/o complications  . Pneumonia 1980's    past hx., has had exposure to agent orange   . Hypothyroidism   . Diabetes mellitus     adult onset - controls with diet/exercise;  Mar[place] Kitchen Hypertension     on low dose of losartan  . GERD (gastroesophageal reflux disease)   . Arthritis     BOTH KNEES and hands;Dr.Deveshwar  . Transfusion history 2012    post TKR  . Constipation due to pain medication   . Frequent urination at night   . Enlarged prostate     Past Surgical History  Procedure Laterality Date  . Uvulectomy  [id number]    Dr. Erik Obey  . Inguinal hernia repair  2002    right, Dr. Truitt Leep  . Tonsillectomy    . Prostate surgery  07/2006    for  urinary urgency, Dr. Risa Grill  . Vasectomy    . Lipoma excision      back  . Wrist surgery  1959    fracture- right  . Sinus & throat surgery-1995  1995  . Meniscectomy Bilateral     Dr. Noemi Chapel  . Eye surgery  2003    R&L cataract removed & IOL- 2003 & 2007,Dr.Jenkins  . Back surgery  2011    hemiarthroplasty 01/11/1999 and 6  . Total knee arthroplasty  01/11/2011    Procedure: TOTAL KNEE ARTHROPLASTY;  Surgeon: Rudean Haskell, MD;  Location: [facility];  Service: Orthopedics;  Laterality: Left;  . Colonscopy  1998    negative   . Joint replacement  2012    left knee replacement  . Cardiac catheterization  09/2001    negative  . Rotator cuff repair Right October, 2016    Family History  Problem Relation Age of Onset  . Sudden death Father     MVA  . Coronary artery disease Mother   . Emphysema Mother   . Diverticulosis Mother   . Asthma Brother   . Bone cancer Brother   . Diabetes Paternal Grandmother   . Sarcoidosis  Son   . Anesthesia problems Neg Hx   . Hypotension Neg Hx   . Malignant hyperthermia Neg Hx   . Pseudochol deficiency Neg Hx   . Colon cancer Neg Hx     History  Drug Use No  ,  History  Alcohol Use  . 2.4 oz/week  . 4 Shots of liquor per week    Comment: social  ,  History  Smoking status  . Former Smoker  . Quit date: 03/01/1966  Smokeless tobacco  . Never Used  ,  History  Sexual Activity  . Sexual Activity: Yes    Patient's Medications  New Prescriptions   No medications on file  Previous Medications   ACETAMINOPHEN (TYLENOL) 500 MG TABLET    Take 500 mg by mouth every 8 (eight) hours as needed for mild pain.    ASPIRIN EC 81 MG TABLET    Take 81 mg by mouth daily.   CALCIUM-MAGNESIUM-ZINC PO    Take 1 tablet by mouth daily.   CELECOXIB (CELEBREX) 200 MG CAPSULE    Take 200 mg by mouth daily.   CYANOCOBALAMIN (VITAMIN B-12 PO)    Take 1 tablet by mouth daily.   GLUCOSE BLOOD (PRECISION XTRA TEST STRIPS) TEST STRIP    USE TO CHECK  BLOOD SUGAR ONCE DAILY DX E11.9   HYPROMELLOSE (ARTIFICIAL TEARS OP)    Place 1 drop into both eyes 2 (two) times daily.   LEVOTHYROXINE (SYNTHROID, LEVOTHROID) 150 MCG TABLET    TAKE 1 TABLET DAILY BEFORE BREAKFAST ( NO FURTHER REFILLS WITHOUT APPOINTMENT )   LOSARTAN (COZAAR) 50 MG TABLET    TAKE ONE-HALF (1/2) TABLET EVERY OTHER DAY   MULTIPLE VITAMIN (MULTIVITAMIN WITH MINERALS) TABS    Take 1 tablet by mouth daily.   NONFORMULARY OR COMPOUNDED ITEM    Glucometer (Precision XTRA- Model #[id number])   OMEGA-3 FATTY ACIDS (FISH OIL PO)    Take 1 capsule by mouth daily.   ROSUVASTATIN (CRESTOR) 5 MG TABLET    Take 1 tablet (5 mg total) by mouth at bedtime.   TAMSULOSIN HCL (FLOMAX) 0.4 MG CAPS    Take 0.4 mg by mouth 2 (two) times daily.   Modified Medications   No medications on file  Discontinued Medications   CEFUROXIME (CEFTIN) 250 MG TABLET    Take 2 tablets (500 mg total) by mouth 2 (two) times daily with a meal.   IBUPROFEN (ADVIL,MOTRIN) 800 MG TABLET    TAKE 1 TABLET BY MOUTH EVERY 8 HOURS AS NEEDED FOR UP TO 10 DAYS    Simvastatin and Oxycodone   Review of Systems: Full 14 point ROS performed via "adult medical history form".  Negative except for Chronic tinnitus, and flatulence.  He denies having little interest or pleasure or feeling down and hopeless or depressed.   Objective:   Blood pressure 128/74, pulse 53, temperature 97.7 F (36.5 C), temperature source Oral, height 5' 10.5" (1.791 m), weight 193 lb 9.6 oz (87.816 kg). Body mass index is 27.38 kg/(m^2).   General: Well Developed, well nourished, and in no acute distress.  Neuro: Alert and oriented x3, extra-ocular muscles intact, sensation grossly intact.  HEENT: Normocephalic, atraumatic, pupils equal round reactive to light, neck supple, no gross masses, no carotid bruits, no JVD apprec Skin: no gross suspicious lesions or rashes, good turgor Cardiac: Regular rate and rhythm, S1S2, no murmurs rubs or gallops.   Respiratory: Essentially clear to auscultation bilaterally. Not using accessory muscles, speaking in full  sentences.  Abdominal: Soft, not grossly distended, positive bowel sounds 4. No guarding rigidity rebound, no organomegaly. Musculoskeletal: Ambulates w/o diff, FROM * 4 ext.  Vasc: less 2 sec cap RF, warm and pink Skin: Warm, dry, pink Psych:  No HI/SI, judgement and insight good.    Impression and Recommendations:    The patient was counselled, risk factors were discussed, anticipatory guidance given. Pt was seen in the office today for 45+ minutes, with over 50% time spent in face the face counseling and coordination of care  Flatulence symptom We will order labs and do stool cxs etc.    Pt will bring in stool sample next week and also make appt for fasting labs- for health maintenance as well.   Then pt will make f/up appt with me one week later to discuss results.   - Discussed with patient how to eat slower, avoid swallowing air. Patient does not have any dentures. Discussed being on an use simethicone when necessary. - We'll await labs and then discuss elimination diets with patient. Told him today to avoid all dairy for the next 2 weeks until seen in follow-up.  Hyperlipidemia Will obtain fasting labs in near future. Told patient to make appointment before leaving clinic today.  B12 deficiency anemia Will check levels  HYPOKALEMIA, MILD Will check labs  Essential hypertension Well controlled. Cont meds  Hypothyroidism No current sx, will check labs  Diet-controlled type 2 diabetes mellitus ([place]) Will obtain labs  BPH (benign prostatic hyperplasia) We'll check labs

## 2015-07-25 NOTE — Patient Instructions (Signed)
Probiotics daily AVOID all dairy!! Simethicone as needed Stool cx's and blood work near future- fasting Then f/up OV with me to discuss results.    Lactose Intolerance, Adult Lactose is the natural sugar found in milk and milk products, such as cheese and yogurt. Lactose is digested by lactase, an enzyme in your small intestine. Some people do not produce enough lactase to digest lactose. This is called lactose intolerance. Lactose intolerance is different from milk allergy, which is a more serious reaction to the protein in milk.  CAUSES Causes of lactose intolerance may include:   Normal aging. The ability to produce lactase may decline with age, causing lactose intolerance over time.  Being born without the ability to make lactase.   Digestive diseases such as gastroenteritis or inflammatory bowel disease.  Surgery or injuries to your small intestine.  Infection in your intestines.  Certain antibiotic medicines and cancer treatments. SIGNS AND SYMPTOMS  Lactose intolerance can cause uncomfortable symptoms. These are likely to occur within 30 minutes to 2 hours after eating or drinking foods containing lactose. Symptoms of lactose intolerance may include:  Nausea.  Diarrhea.  Abdominal cramps or pain.  Bloating.   Gas.  DIAGNOSIS  There are several tests your health care provider can do to diagnose lactose intolerance. These tests include a hydrogen breath test and stool acidity test.  TREATMENT  No treatment can improve your body's ability to produce lactase. However, your symptoms can be controlled by limiting or avoiding milk products and other sources of lactose and adjusting your diet. Lactose-free milk is often tolerated. Lactose digestion may also be improved by adding lactase drops to regular milk or by taking lactase tablets when dairy products are consumed. Tolerance to lactose is individual. Some people may be able to eat or drink small amounts of products with  lactose, while other may need to avoid lactose entirely. Talk to your health care provider about what is best for you.  HOME CARE INSTRUCTIONS  Limit or avoidfoods, beverages, and medicines containing lactose as directed by your health care provider.   Read food and medicine labels carefully to avoid products containing lactose, milk solids, casein, or whey.  If you eliminate dairy products, replace the protein, calcium, vitamin D, and other nutrients they contain through other foods. A registered dietitian or your health care provider can help you adjust your diet.  Choose a milk substitute that is fortified with calcium and vitamin D. Be aware that soy milk contains high quality protein, while milks made from nuts or grains contain very little protein.  Use lactase drops or tablets if directed by your health care provider. SEEK MEDICAL CARE IF: You have no relief from your symptoms after eliminating milk products and other sources of lactose.    This information is not intended to replace advice given to you by your health care provider. Make sure you discuss any questions you have with your health care provider.   Document Released: 02/15/2005 Document Revised: 03/08/2014 Document Reviewed: 05/18/2013 Elsevier Interactive Patient Education 2016 [place]. Diet for Lactose Intolerance, Adult Lactose intolerance is when the body is not able to digest lactose, a natural sugar found in milk and milk products. If you are lactose intolerant, you should avoid consuming food and drinks with lactose.  WHAT DO I NEED TO KNOW ABOUT THIS DIET?  Avoid consuming foods and beverages with lactose.  Look for the words "lactose-free" or "lactose-reduced" on food labels. You can have lactose-free foods and may be  able to have small amounts of lactose-reduced foods.  Make sure you get enough nutrients in your diet. People on this diet sometimes have trouble getting enough calcium, riboflavin, and  vitamin D. Take supplements if directed by your health care provider. Talk to your health care provider about supplements if you are not taking any. WHICH FOODS HAVE LACTOSE? Lactose is found in milk and milk products, such as:   Yogurt.   Cheese.  Butter.   Margarine.  Sour cream.   Creamer.   Whipped toppings and nondairy creamers.  Ice cream and other milk-based desserts. Lactose is also found in foods made with milk or milk ingredients. To find out whether a food is made with milk or a milk ingredient, look at the ingredients list. Avoid foods with the statement "May contain milk" and foods that contain:   Butter.   Cream.  Milk.  Milk solids.  Milk powder.   Whey.  Curd.  Caseinate.  Lactose. WHAT ARE SOME ALTERNATIVES TO MILK AND FOODS MADE WITH MILK PRODUCTS?  Lactose-free products, such as lactose-free milk.  Almond or rice milk.  Soy products, such as soy yogurt, soy cheese, soy ice cream, soy-based sour cream, and soy-based infant formula.  Nondairy products, such as nondairy creamers and nondairy whipped topping. Note that nondairy products sometimes contain lactose, so it is important to check the ingredients list. CAN I HAVE ANY FOODS WITH LACTOSE? Some people with lactose intolerance can safely eat foods that have a little lactose. Foods with a little lactose have less than 1 g of lactose per serving. Examples of foods with a little lactose are:   Aged cheese (such as Swiss, cheddar, or Parmesan cheese). One serving is about 1-2 oz.  Cream cheese. One serving is about 2 Tbsp.  Ricotta cheese. One serving is about  cup. If you decide to try a food that has lactose:   Eat only one food with lactose in it at a time.  Eat only a small amount of the food.  Stop eating the food if your symptoms return. Some dairy products that are more likely than others to be tolerated include:  Cheese, especially if it is aged.  Cultured dairy  products, such as yogurt, buttermilk, cottage cheese, and kefir. The healthy bacteria in these products help digest lactose.  Lactose-hydrolyzed milk. This product contains 40-90% less lactose than milk. AM I GETTING ENOUGH CALCIUM? Calcium is found in many foods with lactose and is important for bone health. The amount of calcium you need depends on your age:   Adults younger than 50 years need 1000 mg of calcium a day.  Adults older than 50 years need 1200 mg of calcium a day. Make sure you get enough calcium by taking a calcium supplement or by eating lactose-free foods that are high in calcium, such as:   Almonds,  cup (95 mg).  Broccoli, cooked, 1 cup (60 mg).  Calcium-fortified breakfast cereals, 1 cup ([phone] mg).  Calcium-fortified rice or almond milk, 1 cup (300 mg).  Calcium-fortified soy milk, 1 cup (300-400 mg).  Canned salmon with edible bones, 3 oz (180 mg).  Collard greens, cooked,  cup (125 mg).  Edamame, cooked,  cup (125 mg).  Kale, frozen or cooked,  cup (90 mg).  Orange juice with calcium added, 1 cup (300-350 mg).  Sardines with edible bones, 3 oz (325 mg).  Spinach, cooked,  cup (145 mg).  Tofu set with calcium sulfate,  cup (250 mg).  This information is not intended to replace advice given to you by your health care provider. Make sure you discuss any questions you have with your health care provider.   Document Released: 09/12/2013 Document Reviewed: 09/12/2013 Elsevier Interactive Patient Education [employer].

## 2015-07-29 ENCOUNTER — Other Ambulatory Visit (INDEPENDENT_AMBULATORY_CARE_PROVIDER_SITE_OTHER): Payer: Medicare Other

## 2015-07-29 DIAGNOSIS — E119 Type 2 diabetes mellitus without complications: Secondary | ICD-10-CM | POA: Diagnosis not present

## 2015-07-29 DIAGNOSIS — E785 Hyperlipidemia, unspecified: Secondary | ICD-10-CM | POA: Diagnosis not present

## 2015-07-29 DIAGNOSIS — D519 Vitamin B12 deficiency anemia, unspecified: Secondary | ICD-10-CM

## 2015-07-29 DIAGNOSIS — E876 Hypokalemia: Secondary | ICD-10-CM | POA: Diagnosis not present

## 2015-07-29 DIAGNOSIS — M25511 Pain in right shoulder: Secondary | ICD-10-CM | POA: Diagnosis not present

## 2015-07-29 DIAGNOSIS — D518 Other vitamin B12 deficiency anemias: Secondary | ICD-10-CM | POA: Diagnosis not present

## 2015-07-29 DIAGNOSIS — N400 Enlarged prostate without lower urinary tract symptoms: Secondary | ICD-10-CM | POA: Diagnosis not present

## 2015-07-29 DIAGNOSIS — E039 Hypothyroidism, unspecified: Secondary | ICD-10-CM

## 2015-07-29 DIAGNOSIS — M810 Age-related osteoporosis without current pathological fracture: Secondary | ICD-10-CM

## 2015-07-29 DIAGNOSIS — E038 Other specified hypothyroidism: Secondary | ICD-10-CM | POA: Diagnosis not present

## 2015-07-29 NOTE — Assessment & Plan Note (Signed)
No current sx, will check labs

## 2015-07-29 NOTE — Assessment & Plan Note (Addendum)
Will obtain fasting labs in near future. Told patient to make appointment before leaving clinic today.

## 2015-07-29 NOTE — Assessment & Plan Note (Signed)
Will check labs

## 2015-07-29 NOTE — Assessment & Plan Note (Signed)
Well controlled. Cont meds

## 2015-07-29 NOTE — Assessment & Plan Note (Signed)
We'll check labs

## 2015-07-29 NOTE — Assessment & Plan Note (Signed)
Will obtain labs

## 2015-07-29 NOTE — Assessment & Plan Note (Signed)
Will check levels 

## 2015-07-30 ENCOUNTER — Other Ambulatory Visit: Payer: Self-pay | Admitting: Family Medicine

## 2015-07-30 DIAGNOSIS — R143 Flatulence: Secondary | ICD-10-CM | POA: Diagnosis not present

## 2015-07-30 LAB — COMPREHENSIVE METABOLIC PANEL
ALT: 18 U/L (ref 9–46)
AST: 19 U/L (ref 10–35)
Albumin: 4.3 g/dL (ref 3.6–5.1)
Alkaline Phosphatase: 62 U/L (ref 40–115)
BUN: 14 mg/dL (ref 7–25)
CO2: 27 mmol/L (ref 20–31)
Calcium: 9.9 mg/dL (ref 8.6–10.3)
Chloride: 99 mmol/L (ref 98–110)
Creat: 0.87 mg/dL (ref 0.70–1.11)
Glucose, Bld: 107 mg/dL — ABNORMAL HIGH (ref 65–99)
Potassium: 5.2 mmol/L (ref 3.5–5.3)
Sodium: 136 mmol/L (ref 135–146)
Total Bilirubin: 0.7 mg/dL (ref 0.2–1.2)
Total Protein: 7.1 g/dL (ref 6.1–8.1)

## 2015-07-30 LAB — CBC WITH DIFFERENTIAL/PLATELET
Basophils Absolute: 0 cells/uL (ref 0–200)
Basophils Relative: 0 %
Eosinophils Absolute: 88 cells/uL (ref 15–500)
Eosinophils Relative: 2 %
HCT: 40.3 % (ref 38.5–50.0)
Hemoglobin: 13.3 g/dL (ref 13.2–17.1)
Lymphocytes Relative: 26 %
Lymphs Abs: 1144 cells/uL (ref 850–3900)
MCH: 30.5 pg (ref 27.0–33.0)
MCHC: 33 g/dL (ref 32.0–36.0)
MCV: 92.4 fL (ref 80.0–100.0)
MPV: 9.8 fL (ref 7.5–12.5)
Monocytes Absolute: 484 cells/uL (ref 200–950)
Monocytes Relative: 11 %
Neutro Abs: 2684 cells/uL (ref 1500–7800)
Neutrophils Relative %: 61 %
Platelets: 249 10*3/uL (ref 140–400)
RBC: 4.36 MIL/uL (ref 4.20–5.80)
RDW: 13.2 % (ref 11.0–15.0)
WBC: 4.4 10*3/uL (ref 3.8–10.8)

## 2015-07-30 LAB — VITAMIN B12: Vitamin B-12: 278 pg/mL (ref 200–1100)

## 2015-07-30 LAB — HEMOGLOBIN A1C
Hgb A1c MFr Bld: 6.3 % — ABNORMAL HIGH (ref <5.7)
Mean Plasma Glucose: 134 mg/dL

## 2015-07-30 LAB — LIPID PANEL
Cholesterol: 122 mg/dL — ABNORMAL LOW (ref 125–200)
HDL: 47 mg/dL (ref >=40)
LDL Cholesterol: 55 mg/dL (ref <130)
Total CHOL/HDL Ratio: 2.6 Ratio (ref <=5.0)
Triglycerides: 102 mg/dL (ref <150)
VLDL: 20 mg/dL (ref <30)

## 2015-07-30 LAB — PSA, TOTAL AND FREE
PSA, Free Pct: 42 % (ref >25)
PSA, Free: 0.83 ng/mL
PSA: 1.99 ng/mL (ref <=4.00)

## 2015-07-30 LAB — TSH: TSH: 0.66 mIU/L (ref 0.40–4.50)

## 2015-07-31 ENCOUNTER — Telehealth: Payer: Self-pay

## 2015-07-31 DIAGNOSIS — M170 Bilateral primary osteoarthritis of knee: Secondary | ICD-10-CM | POA: Diagnosis not present

## 2015-07-31 DIAGNOSIS — M19241 Secondary osteoarthritis, right hand: Secondary | ICD-10-CM | POA: Diagnosis not present

## 2015-07-31 DIAGNOSIS — M810 Age-related osteoporosis without current pathological fracture: Secondary | ICD-10-CM | POA: Diagnosis not present

## 2015-07-31 DIAGNOSIS — M25511 Pain in right shoulder: Secondary | ICD-10-CM | POA: Diagnosis not present

## 2015-07-31 NOTE — Progress Notes (Signed)
Lab added and sent to [facility].  Pt states that he has a hx of osteoporosis.  Charyl Bigger, CMA

## 2015-07-31 NOTE — Telephone Encounter (Signed)
Yes, please add. thnx

## 2015-07-31 NOTE — Addendum Note (Signed)
Addended by: Fonnie Mu on: 07/31/2015 04:58 PM   Modules accepted: Orders

## 2015-07-31 NOTE — Telephone Encounter (Signed)
Done.  T. Nelson, CMA °

## 2015-07-31 NOTE — Telephone Encounter (Signed)
Pt came in to office stating that he saw Dr. Estanislado Pandy, ortho, today and she is requesting that we add a Vit D level to labs collected on 07/29/2015.  He was not given an order for this lab.  Is this okay to do?  Please advise.  Charyl Bigger, CMA

## 2015-08-02 LAB — VITAMIN D 25 HYDROXY (VIT D DEFICIENCY, FRACTURES): Vit D, 25-Hydroxy: 45 ng/mL (ref 30–100)

## 2015-08-03 LAB — STOOL CULTURE

## 2015-08-05 ENCOUNTER — Encounter: Payer: Self-pay | Admitting: Family Medicine

## 2015-08-05 ENCOUNTER — Ambulatory Visit (INDEPENDENT_AMBULATORY_CARE_PROVIDER_SITE_OTHER): Payer: Medicare Other | Admitting: Family Medicine

## 2015-08-05 VITALS — BP 150/75 | HR 56 | Ht 70.5 in | Wt 203.2 lb

## 2015-08-05 DIAGNOSIS — R143 Flatulence: Secondary | ICD-10-CM | POA: Diagnosis not present

## 2015-08-05 DIAGNOSIS — K5903 Drug induced constipation: Secondary | ICD-10-CM | POA: Diagnosis not present

## 2015-08-05 DIAGNOSIS — E038 Other specified hypothyroidism: Secondary | ICD-10-CM

## 2015-08-05 DIAGNOSIS — E1169 Type 2 diabetes mellitus with other specified complication: Secondary | ICD-10-CM | POA: Insufficient documentation

## 2015-08-05 DIAGNOSIS — M25511 Pain in right shoulder: Secondary | ICD-10-CM | POA: Diagnosis not present

## 2015-08-05 DIAGNOSIS — E119 Type 2 diabetes mellitus without complications: Secondary | ICD-10-CM

## 2015-08-05 LAB — GIARDIA ANTIGEN

## 2015-08-05 MED ORDER — LEVOTHYROXINE SODIUM 125 MCG PO TABS: ORAL_TABLET | ORAL | Status: DC

## 2015-08-05 MED ORDER — LEVOTHYROXINE SODIUM 150 MCG PO TABS: ORAL_TABLET | ORAL | Status: DC

## 2015-08-05 NOTE — Assessment & Plan Note (Signed)
We will change dose due to pt occasionally getting sweaty/nervous and low TSH on last labs

## 2015-08-05 NOTE — Progress Notes (Signed)
Subjective:    CC: Here for follow-up labs and "wet flatulence "  HPI: Kyle Simon is a 82 y.o. male who presents to [place] at [facility] today to discuss recent labs and possible treatment regimen changes as well as how his current symptoms are   Diet controlled DM:  A1c is the lowest it's been in about a year now at 6.3. Great. Patient has been changing his eating patterns since his wife is newly diagnosed with type 2 diabetes.  Wet flatulence: Patient has been very strict with cutting back on dairy products with little improvement with his wet flatulence. He has not soiled his underwear since then. Still with chronic diarrhea.  Patient is embarrassed and at times this is a very urgent matter for him as he must get to bathroom or he goes in his pants.    Mixed hyperlipidemia: Patient has been taking his Crestor every other day and see labs below that cholesterol is well controlled. Patient wishes to come off the medicine. He is been stricter with his diet lately and says he will continue if that means he can come off the medicine. He will continue his supplements.  History of B12 deficiency: Now well controlled. 2 years ago was 140 now 278.  Hypothyroidism:  Recently his TSH reading was 0.66.  Lately possibly has been more heat intolerant with sweating more and at times feels a little on edge or nervous. Patient not sure if related to weather changes.     Past Medical History  Diagnosis Date  . Hyperlipidemia   . Snoring     no sleep apnea  . Polio     as a child w/o complications  . Pneumonia 1980's    past hx., has had exposure to agent orange   . Hypothyroidism   . Diabetes mellitus     adult onset - controls with diet/exercise;  Marland Kitchen Hypertension     on low dose of losartan  . GERD (gastroesophageal reflux disease)   . Arthritis     BOTH KNEES and hands;Dr.Deveshwar  . Transfusion history 2012    post TKR  . Constipation due to pain medication     . Frequent urination at night   . Enlarged prostate      Past Surgical History  Procedure Laterality Date  . Uvulectomy  [age]    Dr. Erik Obey  . Inguinal hernia repair  2002    right, Dr. Truitt Leep  . Tonsillectomy    . Prostate surgery  07/2006    for urinary urgency, Dr. Risa Grill  . Vasectomy    . Lipoma excision      back  . Wrist surgery  1959    fracture- right  . Sinus & throat surgery-1995  1995  . Meniscectomy Bilateral     Dr. Noemi Chapel  . Eye surgery  2003    R&L cataract removed & IOL- 2003 & 2007,Dr.Jenkins  . Back surgery  2011    hemiarthroplasty 01/11/1999 and 6  . Total knee arthroplasty  01/11/2011    Procedure: TOTAL KNEE ARTHROPLASTY;  Surgeon: Rudean Haskell, MD;  Location: [facility];  Service: Orthopedics;  Laterality: Left;  . Colonscopy  1998    negative   . Joint replacement  2012    left knee replacement  . Cardiac catheterization  09/2001    negative  . Rotator cuff repair Right October, 2016   Family History  Problem Relation Age of Onset  .  Sudden death Father     MVA  . Coronary artery disease Mother   . Emphysema Mother   . Diverticulosis Mother   . Asthma Brother   . Bone cancer Brother   . Diabetes Paternal Grandmother   . Sarcoidosis Son   . Anesthesia problems Neg Hx   . Hypotension Neg Hx   . Malignant hyperthermia Neg Hx   . Pseudochol deficiency Neg Hx   . Colon cancer Neg Hx     History  Drug Use No  ,  History  Alcohol Use  . 2.4 oz/week  . 4 Shots of liquor per week    Comment: social  ,  History  Smoking status  . Former Smoker  . Quit date: 03/01/1966  Smokeless tobacco  . Never Used  ,  History  Sexual Activity  . Sexual Activity: Yes    Patient's Medications  New Prescriptions   LEVOTHYROXINE (SYNTHROID, LEVOTHROID) 125 MCG TABLET    Take every other day (interchanging with the 144mg dose)  Previous Medications   ACETAMINOPHEN (TYLENOL) 500 MG TABLET    Take 500 mg by mouth every 8 (eight) hours as  needed for mild pain.    ASPIRIN EC 81 MG TABLET    Take 81 mg by mouth daily.   CALCIUM-MAGNESIUM-ZINC PO    Take 1 tablet by mouth daily.   CELECOXIB (CELEBREX) 200 MG CAPSULE    Take 200 mg by mouth daily.   CYANOCOBALAMIN (VITAMIN B-12 PO)    Take 1 tablet by mouth daily.   GLUCOSE BLOOD (PRECISION XTRA TEST STRIPS) TEST STRIP    USE TO CHECK BLOOD SUGAR ONCE DAILY DX E11.9   HYPROMELLOSE (ARTIFICIAL TEARS OP)    Place 1 drop into both eyes 2 (two) times daily.   LOSARTAN (COZAAR) 50 MG TABLET    TAKE ONE-HALF (1/2) TABLET EVERY OTHER DAY   MULTIPLE VITAMIN (MULTIVITAMIN WITH MINERALS) TABS    Take 1 tablet by mouth daily.   NONFORMULARY OR COMPOUNDED ITEM    Glucometer (Precision XTRA- Model #[id number]   OMEGA-3 FATTY ACIDS (FISH OIL PO)    Take 1 capsule by mouth daily.   PROBIOTIC PRODUCT (RESTORA) CAPS    One tab qd   ROSUVASTATIN (CRESTOR) 5 MG TABLET    Take 1 tablet (5 mg total) by mouth at bedtime.   TAMSULOSIN HCL (FLOMAX) 0.4 MG CAPS    Take 0.4 mg by mouth 2 (two) times daily.   Modified Medications   Modified Medication Previous Medication   LEVOTHYROXINE (SYNTHROID, LEVOTHROID) 150 MCG TABLET levothyroxine (SYNTHROID, LEVOTHROID) 150 MCG tablet      Take every other day (interchanging with the 1276m dose)    TAKE 1 TABLET DAILY BEFORE BREAKFAST ( NO FURTHER REFILLS WITHOUT APPOINTMENT )  Discontinued Medications   No medications on file    Simvastatin and Oxycodone  Allergies  Allergen Reactions  . Simvastatin Other (See Comments)    Zocor=Muscle cramps  . Oxycodone Other (See Comments)    Severe constipation    Review of Systems: General:  Denies fever, chills, appetite changes, unexplained weight loss.  Respiratory: Denies SOB, DOE, cough, wheezing.  Cardiovascular: Denies chest pain, palpitations.  Gastrointestinal: Denies nausea, vomiting, diarrhea, abdominal pain.  Genitourinary: Denies dysuria, increased frequency, flank pain. Endocrine: Denies hot  or cold intolerance, polyuria, polydipsia. Musculoskeletal: Denies myalgias, back pain, joint swelling, arthralgias, gait problems.  Skin: Denies pallor, rash, suspicious lesions.  Neurological: Denies dizziness, seizures, syncope, unexplained weakness, lightheadedness,  numbness and headaches.  Psychiatric/Behavioral: Denies mood changes, suicidal or homicidal ideations, hallucinations, sleep disturbances.   Objective:     Filed Vitals:   08/05/15 0827  Height: 5' 10.5" (1.791 m)  Weight: 203 lb 3.2 oz (92.171 kg)   Blood pressure 150/75, pulse 56, height 5' 10.5" (1.791 m), weight 203 lb 3.2 oz (92.171 kg). Body mass index is 28.73 kg/(m^2).   General: Well Developed, well nourished, and in no acute distress.  HEENT: Normocephalic, atraumatic, pupils equal round reactive to light, neck supple, No carotid bruits no JVD Skin: Warm and dry, cap RF less 2 sec Cardiac: Regular rate and rhythm, S1, S2 WNL's, no murmurs rubs or gallops Respiratory: ECTA B/L, Not using accessory muscles, speaking in full sentences. Abd:  Positive bowel sounds 4; no guarding rigidity or rebound, no distention. No organomegaly NeuroM-Sk: Ambulates w/o assistance, moves ext * 4 w/o difficulty, sensation grossly intact.  Psych: A and O *3, judgement and insight good.   Recent Results (from the past 2160 hour(s))  CBC with Differential/Platelet     Status: None   Collection Time: 07/29/15  8:26 AM  Result Value Ref Range   WBC 4.4 3.8 - 10.8 K/uL   RBC 4.36 4.20 - 5.80 MIL/uL   Hemoglobin 13.3 13.2 - 17.1 g/dL   HCT 40.3 38.5 - 50.0 %   MCV 92.4 80.0 - 100.0 fL   MCH 30.5 27.0 - 33.0 pg   MCHC 33.0 32.0 - 36.0 g/dL   RDW 13.2 11.0 - 15.0 %   Platelets 249 140 - 400 K/uL   MPV 9.8 7.5 - 12.5 fL   Neutro Abs 2684 1500 - 7800 cells/uL   Lymphs Abs 1144 850 - 3900 cells/uL   Monocytes Absolute 484 200 - 950 cells/uL   Eosinophils Absolute 88 15 - 500 cells/uL   Basophils Absolute 0 0 - 200 cells/uL    Neutrophils Relative % 61 %   Lymphocytes Relative 26 %   Monocytes Relative 11 %   Eosinophils Relative 2 %   Basophils Relative 0 %   Smear Review Criteria for review not met     Comment: ** Please note change in unit of measure and reference range(s). **  Comprehensive metabolic panel     Status: Abnormal   Collection Time: 07/29/15  8:26 AM  Result Value Ref Range   Sodium 136 135 - 146 mmol/L   Potassium 5.2 3.5 - 5.3 mmol/L   Chloride 99 98 - 110 mmol/L   CO2 27 20 - 31 mmol/L   Glucose, Bld 107 (H) 65 - 99 mg/dL   BUN 14 7 - 25 mg/dL   Creat 0.87 0.70 - 1.11 mg/dL   Total Bilirubin 0.7 0.2 - 1.2 mg/dL   Alkaline Phosphatase 62 40 - 115 U/L   AST 19 10 - 35 U/L   ALT 18 9 - 46 U/L   Total Protein 7.1 6.1 - 8.1 g/dL   Albumin 4.3 3.6 - 5.1 g/dL   Calcium 9.9 8.6 - 10.3 mg/dL  Hemoglobin A1c     Status: Abnormal   Collection Time: 07/29/15  8:26 AM  Result Value Ref Range   Hgb A1c MFr Bld 6.3 (H) <5.7 %    Comment:   For someone without known diabetes, a hemoglobin A1c value between 5.7% and 6.4% is consistent with prediabetes and should be confirmed with a follow-up test.   For someone with known diabetes, a value <7% indicates that their diabetes is  well controlled. A1c targets should be individualized based on duration of diabetes, age, co-morbid conditions and other considerations.   This assay result is consistent with an increased risk of diabetes.   Currently, no consensus exists regarding use of hemoglobin A1c for diagnosis of diabetes in children.      Mean Plasma Glucose 134 mg/dL  Lipid panel     Status: Abnormal   Collection Time: 07/29/15  8:26 AM  Result Value Ref Range   Cholesterol 122 (L) 125 - 200 mg/dL   Triglycerides 102 <150 mg/dL   HDL 47 >=40 mg/dL   Total CHOL/HDL Ratio 2.6 <=5.0 Ratio   VLDL 20 <30 mg/dL   LDL Cholesterol 55 <130 mg/dL    Comment:   Total Cholesterol/HDL Ratio:CHD Risk                        Coronary Heart Disease  Risk Table                                        Men       Women          1/2 Average Risk              3.4        3.3              Average Risk              5.0        4.4           2X Average Risk              9.6        7.1           3X Average Risk             23.4       11.0 Use the calculated Patient Ratio above and the CHD Risk table  to determine the patient's CHD Risk.   TSH     Status: None   Collection Time: 07/29/15  8:26 AM  Result Value Ref Range   TSH 0.66 0.40 - 4.50 mIU/L  PSA, total and free     Status: None   Collection Time: 07/29/15  8:26 AM  Result Value Ref Range   PSA 1.99 <=4.00 ng/mL    Comment: Test Methodology: ECLIA PSA (Electrochemiluminescence Immunoassay)   For PSA values from 2.5-4.0, particularly in younger men <46 years old, the AUA and NCCN suggest testing for % Free PSA (3515) and evaluation of the rate of increase in PSA (PSA velocity).    PSA, Free 0.83 ng/mL   PSA, Free Pct 42 >25 %    Comment:                        Probability of Prostate Cancer   (For Men with Non-Suspicious DRE Results and PSA Between 4 and   10 ng/mL, By Patient Age)     % free PSA                          Patient Age                          12 to 74 Years  60 to 69 Years  >70 Years    <=10%                  49.2%           57.5%          64.5%    11 - 18%               26.9%           33.9%          40.8%    19 - 25%               18.3%           23.9%          29.7%    >25%                    9.1%           12.2%          15.8%   Vitamin B12     Status: None   Collection Time: 07/29/15  8:26 AM  Result Value Ref Range   Vitamin B-12 278 200 - 1100 pg/mL  Vitamin D (25 hydroxy)     Status: None   Collection Time: 07/29/15  8:26 AM  Result Value Ref Range   Vit D, 25-Hydroxy 45 30 - 100 ng/mL    Comment: Vitamin D Status           25-OH Vitamin D        Deficiency                <20 ng/mL        Insufficiency         20 - 29 ng/mL        Optimal              > or = 30 ng/mL   For 25-OH Vitamin D testing on patients on D2-supplementation and patients for whom quantitation of D2 and D3 fractions is required, the QuestAssureD 25-OH VIT D, (D2,D3), LC/MS/MS is recommended: order code [id number] (patients > 2 yrs).   Stool culture     Status: None   Collection Time: 07/30/15 10:11 AM  Result Value Ref Range   Organism ID, Bacteria No Salmonella,Shigella,Campylobacter,Yersinia,or    Organism ID, Bacteria No E.coli [id number]:[id number] isolated.   Giardia antigen     Status: None   Collection Time: 07/30/15 10:11 AM  Result Value Ref Range   Source Stool    Giardia Ag, EIA, Stool       Comment:   GIARDIA AG, EIA, STOOL       MICRO NUMBER:      [id number]   TEST STATUS:       FINAL   SPECIMEN SOURCE:   STOOL   SPECIMEN QUALITY:  ADEQUATE   RESULT 1:          Not Detected                      NOTE: Due to intermittent shedding, one negative                      sample does not necessarily rule out the presence                      of a parasitic infection.  Impression and Recommendations:    Flatulence symptom Refer to GI as pt's sx with little improvement on diet mod and with probitotics and labs all look good.   Hypothyroidism We will change dose due to pt occasionally getting sweaty/nervous and low TSH on last labs Patient understands whenever we changed the dose of his Synthroid, he needs to come in for follow-up repeat labs of TSH in 6-8 weeks. He verbally expressed understanding of taking Synthroid 150 g one day and then 125 g the next, and will continue to alternate.  Patient is very sharp for his age and completely understands these instructions.  He has a pill container and he said this will not be a problem to remember.  All other labs reviewed in full with patient.  All questions and concerns addressed.  Orders Placed This Encounter  Procedures  . Ambulatory referral to Gastroenterology    Referral Priority:  Routine    Referral  Type:  Consultation    Referral Reason:  Specialty Services Required    Referred to Provider:  Juanita Craver, MD    Requested Specialty:  Gastroenterology    Number of Visits Requested:  1    Gross side effects, risk and benefits, and alternatives of medications discussed with patient.  Patient is aware that all medications have potential side effects and we are unable to predict every sideeffect or drug-drug interaction that may occur.  Expresses verbal understanding and consents to current therapy plan and treatment regiment.  Note: This document was prepared using Dragon voice recognition software and may include unintentional dictation errors.

## 2015-08-05 NOTE — Assessment & Plan Note (Signed)
Refer to GI as pt's sx with little improvement on diet mod and with probitotics and labs all look good.

## 2015-08-05 NOTE — Patient Instructions (Addendum)
- Stop crestor- reck in 50mo chol panel. Patient understands he must continue prudent diet. - Diet controlled diabetes. Very well controlled. Patient understands he must continue lifestyle modification to control this. - cut down on levothyroxine dose.  198mcg one day and then 120mcg the next; reck tsh in 6-8wks - Referral to dr Collene Mares GI- re: "wet flatulence"  Diabetes and Exercise Exercising regularly is important. It is not just about losing weight. It has many health benefits, such as:  Improving your overall fitness, flexibility, and endurance.  Increasing your bone density.  Helping with weight control.  Decreasing your body fat.  Increasing your muscle strength.  Reducing stress and tension.  Improving your overall health. People with diabetes who exercise gain additional benefits because exercise:  Reduces appetite.  Improves the body's use of blood sugar (glucose).  Helps lower or control blood glucose.  Decreases blood pressure.  Helps control blood lipids (such as cholesterol and triglycerides).  Improves the body's use of the hormone insulin by:  Increasing the body's insulin sensitivity.  Reducing the body's insulin needs.  Decreases the risk for heart disease because exercising:  Lowers cholesterol and triglycerides levels.  Increases the levels of good cholesterol (such as high-density lipoproteins [HDL]) in the body.  Lowers blood glucose levels. YOUR ACTIVITY PLAN  Choose an activity that you enjoy, and set realistic goals. To exercise safely, you should begin practicing any new physical activity slowly, and gradually increase the intensity of the exercise over time. Your health care provider or diabetes educator can help create an activity plan that works for you. General recommendations include:  Encouraging children to engage in at least 60 minutes of physical activity each day.  Stretching and performing strength training exercises, such as yoga or  weight lifting, at least 2 times per week.  Performing a total of at least 150 minutes of moderate-intensity exercise each week, such as brisk walking or water aerobics.  Exercising at least 3 days per week, making sure you allow no more than 2 consecutive days to pass without exercising.  Avoiding long periods of inactivity (90 minutes or more). When you have to spend an extended period of time sitting down, take frequent breaks to walk or stretch. RECOMMENDATIONS FOR EXERCISING WITH TYPE 1 OR TYPE 2 DIABETES   Check your blood glucose before exercising. If blood glucose levels are greater than 240 mg/dL, check for urine ketones. Do not exercise if ketones are present.  Avoid injecting insulin into areas of the body that are going to be exercised. For example, avoid injecting insulin into:  The arms when playing tennis.  The legs when jogging.  Keep a record of:  Food intake before and after you exercise.  Expected peak times of insulin action.  Blood glucose levels before and after you exercise.  The type and amount of exercise you have done.  Review your records with your health care provider. Your health care provider will help you to develop guidelines for adjusting food intake and insulin amounts before and after exercising.  If you take insulin or oral hypoglycemic agents, watch for signs and symptoms of hypoglycemia. They include:  Dizziness.  Shaking.  Sweating.  Chills.  Confusion.  Drink plenty of water while you exercise to prevent dehydration or heat stroke. Body water is lost during exercise and must be replaced.  Talk to your health care provider before starting an exercise program to make sure it is safe for you. Remember, almost any type of activity  is better than none.   This information is not intended to replace advice given to you by your health care provider. Make sure you discuss any questions you have with your health care provider.   Document  Released: 05/08/2003 Document Revised: 07/02/2014 Document Reviewed: 07/25/2012 Elsevier Interactive Patient Education 2016 Elsevier Inc.   Cholesterol Cholesterol is a white, waxy, fat-like substance needed by your body in small amounts. The liver makes all the cholesterol you need. Cholesterol is carried from the liver by the blood through the blood vessels. Deposits of cholesterol (plaque) may build up on blood vessel walls. These make the arteries narrower and stiffer. Cholesterol plaques increase the risk for heart attack and stroke.  You cannot feel your cholesterol level even if it is very high. The only way to know it is high is with a blood test. Once you know your cholesterol levels, you should keep a record of the test results. Work with your health care provider to keep your levels in the desired range.  WHAT DO THE RESULTS MEAN?  Total cholesterol is a rough measure of all the cholesterol in your blood.   LDL is the so-called bad cholesterol. This is the type that deposits cholesterol in the walls of the arteries. You want this level to be low.   HDL is the good cholesterol because it cleans the arteries and carries the LDL away. You want this level to be high.  Triglycerides are fat that the body can either burn for energy or store. High levels are closely linked to heart disease.  WHAT ARE THE DESIRED LEVELS OF CHOLESTEROL?  Total cholesterol below 200.   LDL below 100 for people at risk, below 70 for those at very high risk.   HDL above 50 is good, above 60 is best.   Triglycerides below 150.  HOW CAN I LOWER MY CHOLESTEROL?  Diet. Follow your diet programs as directed by your health care provider.   Choose fish or white meat chicken and [place], roasted or baked. Limit fatty cuts of red meat, fried foods, and processed meats, such as sausage and lunch meats.   Eat lots of fresh fruits and vegetables.  Choose whole grains, beans, pasta, potatoes, and cereals.    Use only small amounts of olive, corn, or canola oils.   Avoid butter, mayonnaise, shortening, or palm kernel oils.  Avoid foods with trans fats.   Drink skim or nonfat milk and eat low-fat or nonfat yogurt and cheeses. Avoid whole milk, cream, ice cream, egg yolks, and full-fat cheeses.   Healthy desserts include angel food cake, ginger snaps, animal crackers, hard candy, popsicles, and low-fat or nonfat frozen yogurt. Avoid pastries, cakes, pies, and cookies.   Exercise. Follow your exercise programs as directed by your health care provider.   A regular program helps decrease LDL and raise HDL.   A regular program helps with weight control.   Do things that increase your activity level like gardening, walking, or taking the stairs. Ask your health care provider about how you can be more active in your daily life.   Medicine. Take medicine only as directed by your health care provider.   Medicine may be prescribed by your health care provider to help lower cholesterol and decrease the risk for heart disease.   If you have several risk factors, you may need medicine even if your levels are normal.   This information is not intended to replace advice given to you by your health  care provider. Make sure you discuss any questions you have with your health care provider.   Document Released: 11/10/2000 Document Revised: 03/08/2014 Document Reviewed: 11/29/2012 Elsevier Interactive Patient Education [employer].

## 2015-08-06 ENCOUNTER — Encounter: Payer: Self-pay | Admitting: Family Medicine

## 2015-08-06 DIAGNOSIS — K5900 Constipation, unspecified: Secondary | ICD-10-CM | POA: Insufficient documentation

## 2015-08-06 DIAGNOSIS — K5903 Drug induced constipation: Secondary | ICD-10-CM

## 2015-08-12 DIAGNOSIS — M25511 Pain in right shoulder: Secondary | ICD-10-CM | POA: Diagnosis not present

## 2015-08-22 ENCOUNTER — Ambulatory Visit (INDEPENDENT_AMBULATORY_CARE_PROVIDER_SITE_OTHER): Payer: Medicare Other | Admitting: Family Medicine

## 2015-08-22 ENCOUNTER — Encounter: Payer: Self-pay | Admitting: Family Medicine

## 2015-08-22 VITALS — BP 123/74 | HR 56 | Ht 70.5 in | Wt 193.3 lb

## 2015-08-22 DIAGNOSIS — H6123 Impacted cerumen, bilateral: Secondary | ICD-10-CM

## 2015-08-22 DIAGNOSIS — M25511 Pain in right shoulder: Secondary | ICD-10-CM | POA: Diagnosis not present

## 2015-08-22 DIAGNOSIS — R143 Flatulence: Secondary | ICD-10-CM | POA: Diagnosis not present

## 2015-08-22 DIAGNOSIS — H9190 Unspecified hearing loss, unspecified ear: Secondary | ICD-10-CM | POA: Diagnosis not present

## 2015-08-22 DIAGNOSIS — E038 Other specified hypothyroidism: Secondary | ICD-10-CM | POA: Diagnosis not present

## 2015-08-22 NOTE — Progress Notes (Signed)
Subjective:    CC:  tickle in his ears b/l 2-3 wks  HPI: Kyle Simon is a 82 y.o. male who presents to [place] at [facility] today to review recent labs in person, has ear complaints- feels full w wax, discuss GI appt and change dose in thyroid meds.   He gets a lot of ear wax build up and needs them cleaned out at least couple times per yr.    SOUNDS get muffled  Has appt Dr Collene Mares for Gi issues- his GI sx have improved 70% since he has changed his diet per my recs and on probiotics.  Pt will let me know if we need to make any changes  Hypothyroidism- changed dose and reminded pt he needs reck in 6-8 wks  reviewed labs with pt in person again  Past Medical History  Diagnosis Date  . Hyperlipidemia   . Snoring     no sleep apnea  . Polio     as a child w/o complications  . Hypothyroidism   . GERD (gastroesophageal reflux disease)   . Arthritis     BOTH KNEES and hands;Dr.Deveshwar  . Transfusion history 2012    post TKR  . Constipation due to pain medication   . Frequent urination at night   . Enlarged prostate     Past Surgical History  Procedure Laterality Date  . Uvulectomy  5831    Dr. Erik Obey  . Inguinal hernia repair  2002    right, Dr. Truitt Leep  . Tonsillectomy    . Prostate surgery  07/2006    for urinary urgency, Dr. Risa Grill  . Vasectomy    . Lipoma excision      back  . Wrist surgery  1959    fracture- right  . Sinus & throat surgery-1995  1995  . Meniscectomy Bilateral     Dr. Noemi Chapel  . Eye surgery  2003    R&L cataract removed & IOL- 2003 & 2007,Dr.Jenkins  . Back surgery  2011    hemiarthroplasty 01/11/1999 and 6  . Total knee arthroplasty  01/11/2011    Procedure: TOTAL KNEE ARTHROPLASTY;  Surgeon: Rudean Haskell, MD;  Location: [place];  Service: Orthopedics;  Laterality: Left;  . Colonscopy  1998    negative   . Joint replacement  2012    left knee replacement  . Cardiac catheterization  09/2001    negative  .  Rotator cuff repair Right October, 2016    Family History  Problem Relation Age of Onset  . Sudden death Father     MVA  . Coronary artery disease Mother   . Emphysema Mother   . Diverticulosis Mother   . Asthma Brother   . Bone cancer Brother   . Diabetes Paternal Grandmother   . Sarcoidosis Son   . Anesthesia problems Neg Hx   . Hypotension Neg Hx   . Malignant hyperthermia Neg Hx   . Pseudochol deficiency Neg Hx   . Colon cancer Neg Hx     History  Drug Use No  ,  History  Alcohol Use  . 2.4 oz/week  . 4 Shots of liquor per week    Comment: social  ,  History  Smoking status  . Former Smoker  . Quit date: 03/01/1966  Smokeless tobacco  . Never Used  ,  History  Sexual Activity  . Sexual Activity: Yes    Current Outpatient Prescriptions on File Prior to  Visit  Medication Sig Dispense Refill  . acetaminophen (TYLENOL) 500 MG tablet Take 500 mg by mouth every 8 (eight) hours as needed for mild pain.     Marland Kitchen aspirin EC 81 MG tablet Take 81 mg by mouth daily.    Marland Kitchen CALCIUM-MAGNESIUM-ZINC PO Take 1 tablet by mouth daily.    . celecoxib (CELEBREX) 200 MG capsule Take 200 mg by mouth every other day.     . Cyanocobalamin (VITAMIN B-12 PO) Take 1 tablet by mouth daily.    Marland Kitchen glucose blood (PRECISION XTRA TEST STRIPS) test strip USE TO CHECK BLOOD SUGAR ONCE DAILY DX E11.9 100 each 2  . Hypromellose (ARTIFICIAL TEARS OP) Place 1 drop into both eyes 2 (two) times daily.    Marland Kitchen levothyroxine (SYNTHROID, LEVOTHROID) 125 MCG tablet Take every other day (interchanging with the 171mg dose) 90 tablet 1  . levothyroxine (SYNTHROID, LEVOTHROID) 150 MCG tablet Take every other day (interchanging with the 1263m dose) 90 tablet 1  . losartan (COZAAR) 50 MG tablet TAKE ONE-HALF (1/2) TABLET EVERY OTHER DAY 90 tablet 1  . Multiple Vitamin (MULTIVITAMIN WITH MINERALS) TABS Take 1 tablet by mouth daily.    . NONFORMULARY OR COMPOUNDED ITEM Glucometer (Precision XTRA- Model #[id number] 1 each 0  . Omega-3 Fatty Acids (FISH OIL PO) Take 1 capsule by mouth daily.    . Probiotic Product (RESTORA) CAPS One tab qd 90 capsule 3  . Tamsulosin HCl (FLOMAX) 0.4 MG CAPS Take 0.4 mg by mouth 2 (two) times daily.      No current facility-administered medications on file prior to visit.    Allergies  Allergen Reactions  . Simvastatin Other (See Comments)    Zocor=Muscle cramps  . Oxycodone Other (See Comments)    Severe constipation     Review of Systems:  ( Completed via her adult medical history intake form today ) General:  Denies fever, chills, appetite changes, unexplained weight loss.  Respiratory: Denies SOB, DOE, cough, wheezing.  Cardiovascular: Denies chest pain, palpitations.  Gastrointestinal: Denies nausea, vomiting, diarrhea, abdominal pain.  Genitourinary: Denies dysuria, increased frequency, flank pain. Endocrine: Denies hot or cold intolerance, polyuria, polydipsia. Musculoskeletal: Denies myalgias, back pain, joint swelling, arthralgias, gait problems.  Skin: Denies pallor, rash, suspicious lesions.  Neurological: Denies dizziness, seizures, syncope, unexplained weakness, lightheadedness, numbness and headaches.  Psychiatric/Behavioral: Denies mood changes, suicidal or homicidal ideations, hallucinations, sleep disturbances.   Objective:    Blood pressure 123/74, pulse 56, height 5' 10.5" (1.791 m), weight 193 lb 4.8 oz (87.68 kg). Body mass index is 27.33 kg/(m^2).   General: Well Developed, well nourished, and in no acute distress.  HEENT: Normocephalic, atraumatic, pupils equal round reactive to light, neck supple, TM's blocked b/l with cerumen.  Skin: Warm and dry, cap RF less 2 sec Cardiac: Regular rate and rhythm, S1, S2 WNL's, no murmurs rubs or gallops Respiratory: ECTA B/L, Not using accessory muscles, speaking in full sentences. NeuroM-Sk: Ambulates w/o assistance, moves ext * 4 w/o difficulty, sensation grossly intact.  Psych: A and O *3,  judgement and insight good.       Recent Results (from the past 2160 hour(s))  CBC with Differential/Platelet     Status: None   Collection Time: 07/29/15  8:26 AM  Result Value Ref Range   WBC 4.4 3.8 - 10.8 K/uL   RBC 4.36 4.20 - 5.80 MIL/uL   Hemoglobin 13.3 13.2 - 17.1 g/dL   HCT 40.3 38.5 - 50.0 %   MCV  92.4 80.0 - 100.0 fL   MCH 30.5 27.0 - 33.0 pg   MCHC 33.0 32.0 - 36.0 g/dL   RDW 13.2 11.0 - 15.0 %   Platelets 249 140 - 400 K/uL   MPV 9.8 7.5 - 12.5 fL   Neutro Abs 2684 1500 - 7800 cells/uL   Lymphs Abs 1144 850 - 3900 cells/uL   Monocytes Absolute 484 200 - 950 cells/uL   Eosinophils Absolute 88 15 - 500 cells/uL   Basophils Absolute 0 0 - 200 cells/uL   Neutrophils Relative % 61 %   Lymphocytes Relative 26 %   Monocytes Relative 11 %   Eosinophils Relative 2 %   Basophils Relative 0 %   Smear Review Criteria for review not met     Comment: ** Please note change in unit of measure and reference range(s). **  Comprehensive metabolic panel     Status: Abnormal   Collection Time: 07/29/15  8:26 AM  Result Value Ref Range   Sodium 136 135 - 146 mmol/L   Potassium 5.2 3.5 - 5.3 mmol/L   Chloride 99 98 - 110 mmol/L   CO2 27 20 - 31 mmol/L   Glucose, Bld 107 (H) 65 - 99 mg/dL   BUN 14 7 - 25 mg/dL   Creat 0.87 0.70 - 1.11 mg/dL   Total Bilirubin 0.7 0.2 - 1.2 mg/dL   Alkaline Phosphatase 62 40 - 115 U/L   AST 19 10 - 35 U/L   ALT 18 9 - 46 U/L   Total Protein 7.1 6.1 - 8.1 g/dL   Albumin 4.3 3.6 - 5.1 g/dL   Calcium 9.9 8.6 - 10.3 mg/dL  Hemoglobin A1c     Status: Abnormal   Collection Time: 07/29/15  8:26 AM  Result Value Ref Range   Hgb A1c MFr Bld 6.3 (H) <5.7 %    Comment:   For someone without known diabetes, a hemoglobin A1c value between 5.7% and 6.4% is consistent with prediabetes and should be confirmed with a follow-up test.   For someone with known diabetes, a value <7% indicates that their diabetes is well controlled. A1c targets should be  individualized based on duration of diabetes, age, co-morbid conditions and other considerations.   This assay result is consistent with an increased risk of diabetes.   Currently, no consensus exists regarding use of hemoglobin A1c for diagnosis of diabetes in children.      Mean Plasma Glucose 134 mg/dL  Lipid panel     Status: Abnormal   Collection Time: 07/29/15  8:26 AM  Result Value Ref Range   Cholesterol 122 (L) 125 - 200 mg/dL   Triglycerides 102 <150 mg/dL   HDL 47 >=40 mg/dL   Total CHOL/HDL Ratio 2.6 <=5.0 Ratio   VLDL 20 <30 mg/dL   LDL Cholesterol 55 <130 mg/dL    Comment:   Total Cholesterol/HDL Ratio:CHD Risk                        Coronary Heart Disease Risk Table                                        Men       Women          1/2 Average Risk              3.4  3.3              Average Risk              5.0        4.4           2X Average Risk              9.6        7.1           3X Average Risk             23.4       11.0 Use the calculated Patient Ratio above and the CHD Risk table  to determine the patient's CHD Risk.   TSH     Status: None   Collection Time: 07/29/15  8:26 AM  Result Value Ref Range   TSH 0.66 0.40 - 4.50 mIU/L  PSA, total and free     Status: None   Collection Time: 07/29/15  8:26 AM  Result Value Ref Range   PSA 1.99 <=4.00 ng/mL    Comment: Test Methodology: ECLIA PSA (Electrochemiluminescence Immunoassay)   For PSA values from 2.5-4.0, particularly in younger men <49 years old, the AUA and NCCN suggest testing for % Free PSA (3515) and evaluation of the rate of increase in PSA (PSA velocity).    PSA, Free 0.83 ng/mL   PSA, Free Pct 42 >25 %    Comment:                        Probability of Prostate Cancer   (For Men with Non-Suspicious DRE Results and PSA Between 4 and   10 ng/mL, By Patient Age)     % free PSA                          Patient Age                          47 to 70 Years  65 to 65 Years  >70 Years     <=10%                  49.2%           57.5%          64.5%    11 - 18%               26.9%           33.9%          40.8%    19 - 25%               18.3%           23.9%          29.7%    >25%                    9.1%           12.2%          15.8%   Vitamin B12     Status: None   Collection Time: 07/29/15  8:26 AM  Result Value Ref Range   Vitamin B-12 278 200 - 1100 pg/mL  Vitamin D (25 hydroxy)     Status: None   Collection Time: 07/29/15  8:26 AM  Result Value Ref Range   Vit D,  25-Hydroxy 45 30 - 100 ng/mL    Comment: Vitamin D Status           25-OH Vitamin D        Deficiency                <20 ng/mL        Insufficiency         20 - 29 ng/mL        Optimal             > or = 30 ng/mL   For 25-OH Vitamin D testing on patients on D2-supplementation and patients for whom quantitation of D2 and D3 fractions is required, the QuestAssureD 25-OH VIT D, (D2,D3), LC/MS/MS is recommended: order code [phone] (patients > 2 yrs).   Stool culture     Status: None   Collection Time: 07/30/15 10:11 AM  Result Value Ref Range   Organism ID, Bacteria No Salmonella,Shigella,Campylobacter,Yersinia,or    Organism ID, Bacteria No E.coli [id number]:[id number] isolated.   Giardia antigen     Status: None   Collection Time: 07/30/15 10:11 AM  Result Value Ref Range   Source Stool    Giardia Ag, EIA, Stool       Comment:   GIARDIA AG, EIA, STOOL       MICRO NUMBER:      [id number]   TEST STATUS:       FINAL   SPECIMEN SOURCE:   STOOL   SPECIMEN QUALITY:  ADEQUATE   RESULT 1:          Not Detected                      NOTE: Due to intermittent shedding, one negative                      sample does not necessarily rule out the presence                      of a parasitic infection.     Impression and Recommendations:    Labs reveiwed in full.  Hypothyroidism Reck levels in 6-7 wks. Sx stable.  Cerumen impaction Risks and benefits of procedure discussed with patient.  Consent obtained.  See  procedure note below.  Ear care d/c pt.  RTC prn if sx return  Flatulence symptom Sx much improved with changes in diet and use of probiotics; await GI input if different  Indication: Cerumen impaction of the ear(s) Medical necessity statement: On physical examination, cerumen impairs clinically significant portions of the external auditory canal, and tympanic membrane.  Noted obstructive, copious cerumen that cannot be removed without magnification and instrumentations requiring physician skills  Consent: Discussed benefits and risks of procedure and verbal consent obtained  Procedure: Patient was prepped for the procedure.  Utilized an otoscope to assess and take note of the ear canal, the tympanic membrane, and the presence, amount, and placement of the cerumen. 2 soft plastic curettes were utilized to remove cerumen b/l.   Post procedure examination: shows cerumen was removed. TMs intact b/l w/o acute changes.    Post-Care Instructions:  Patient tolerated procedure well.  Proper ear care d/c pt.   The patient is made aware that they may experience temporary vertigo, temporary hearing loss, and temporary discomfort.  If these symptom last for more than 24 hours to call the clinic or proceed to the ED/Urgent Care.  Note: This document was  prepared using Systems analyst and may include unintentional dictation errors.

## 2015-08-22 NOTE — Patient Instructions (Addendum)
No q-tips Reck tsh, T3, T4 in about 6 wks or so Let me know what Dr Collene Mares says.      Cerumen Impaction The structures of the external ear canal secrete a waxy substance known as cerumen. Excess cerumen can build up in the ear canal, causing a condition known as cerumen impaction. Cerumen impaction can cause ear pain and disrupt the function of the ear. The rate of cerumen production differs for each individual. In certain individuals, the configuration of the ear canal may decrease his or her ability to naturally remove cerumen. CAUSES Cerumen impaction is caused by excessive cerumen production or buildup. RISK FACTORS  Frequent use of swabs to clean ears.  Having narrow ear canals.  Having eczema.  Being dehydrated. SIGNS AND SYMPTOMS  Diminished hearing.  Ear drainage.  Ear pain.  Ear itch. TREATMENT Treatment may involve:  Over-the-counter or prescription ear drops to soften the cerumen.  Removal of cerumen by a health care provider. This may be done with:  Irrigation with warm water. This is the most common method of removal.  Ear curettes and other instruments.  Surgery. This may be done in severe cases. HOME CARE INSTRUCTIONS  Take medicines only as directed by your health care provider.  Do not insert objects into the ear with the intent of cleaning the ear. PREVENTION  Do not insert objects into the ear, even with the intent of cleaning the ear. Removing cerumen as a part of normal hygiene is not necessary, and the use of swabs in the ear canal is not recommended.  Drink enough water to keep your urine clear or pale yellow.  Control your eczema if you have it. SEEK MEDICAL CARE IF:  You develop ear pain.  You develop bleeding from the ear.  The cerumen does not clear after you use ear drops as directed.   This information is not intended to replace advice given to you by your health care provider. Make sure you discuss any questions you have with  your health care provider.   Document Released: 03/25/2004 Document Revised: 03/08/2014 Document Reviewed: 10/02/2014 Elsevier Interactive Patient Education 2016 [place] cerumen impaction patient instructions here.

## 2015-08-25 DIAGNOSIS — R194 Change in bowel habit: Secondary | ICD-10-CM | POA: Diagnosis not present

## 2015-08-25 DIAGNOSIS — H6120 Impacted cerumen, unspecified ear: Secondary | ICD-10-CM | POA: Insufficient documentation

## 2015-08-25 NOTE — Assessment & Plan Note (Signed)
Reck levels in 6-7 wks. Sx stable.

## 2015-08-25 NOTE — Assessment & Plan Note (Signed)
Risks and benefits of procedure discussed with patient.  Consent obtained.  See procedure note below.  Ear care d/c pt.  RTC prn if sx return

## 2015-08-25 NOTE — Assessment & Plan Note (Signed)
Sx much improved with changes in diet and use of probiotics; await GI input if different

## 2015-08-26 DIAGNOSIS — M25511 Pain in right shoulder: Secondary | ICD-10-CM | POA: Diagnosis not present

## 2015-09-03 DIAGNOSIS — H608X3 Other otitis externa, bilateral: Secondary | ICD-10-CM | POA: Diagnosis not present

## 2015-09-04 DIAGNOSIS — M25511 Pain in right shoulder: Secondary | ICD-10-CM | POA: Diagnosis not present

## 2015-09-12 DIAGNOSIS — M25511 Pain in right shoulder: Secondary | ICD-10-CM | POA: Diagnosis not present

## 2015-09-23 ENCOUNTER — Encounter: Payer: Medicare Other | Admitting: Internal Medicine

## 2015-09-25 DIAGNOSIS — M25511 Pain in right shoulder: Secondary | ICD-10-CM | POA: Diagnosis not present

## 2015-10-08 DIAGNOSIS — M25511 Pain in right shoulder: Secondary | ICD-10-CM | POA: Diagnosis not present

## 2015-10-09 DIAGNOSIS — L603 Nail dystrophy: Secondary | ICD-10-CM | POA: Diagnosis not present

## 2015-10-09 DIAGNOSIS — E1151 Type 2 diabetes mellitus with diabetic peripheral angiopathy without gangrene: Secondary | ICD-10-CM | POA: Diagnosis not present

## 2015-10-09 DIAGNOSIS — I739 Peripheral vascular disease, unspecified: Secondary | ICD-10-CM | POA: Diagnosis not present

## 2015-10-13 ENCOUNTER — Other Ambulatory Visit: Payer: Self-pay

## 2015-10-13 DIAGNOSIS — E038 Other specified hypothyroidism: Secondary | ICD-10-CM

## 2015-10-14 ENCOUNTER — Other Ambulatory Visit: Payer: Medicare Other

## 2015-10-16 DIAGNOSIS — M25511 Pain in right shoulder: Secondary | ICD-10-CM | POA: Diagnosis not present

## 2015-10-17 ENCOUNTER — Other Ambulatory Visit (INDEPENDENT_AMBULATORY_CARE_PROVIDER_SITE_OTHER): Payer: Medicare Other

## 2015-10-17 DIAGNOSIS — E039 Hypothyroidism, unspecified: Secondary | ICD-10-CM

## 2015-10-17 DIAGNOSIS — E119 Type 2 diabetes mellitus without complications: Secondary | ICD-10-CM

## 2015-10-17 DIAGNOSIS — E038 Other specified hypothyroidism: Secondary | ICD-10-CM | POA: Diagnosis not present

## 2015-10-18 LAB — T3, FREE: T3, Free: 2.7 pg/mL (ref 2.3–4.2)

## 2015-10-18 LAB — HEMOGLOBIN A1C
Hgb A1c MFr Bld: 6.3 % — ABNORMAL HIGH (ref <5.7)
Mean Plasma Glucose: 134 mg/dL

## 2015-10-18 LAB — T4, FREE: Free T4: 1.5 ng/dL (ref 0.8–1.8)

## 2015-10-18 LAB — TSH: TSH: 3.45 mIU/L (ref 0.40–4.50)

## 2015-10-20 DIAGNOSIS — B079 Viral wart, unspecified: Secondary | ICD-10-CM | POA: Diagnosis not present

## 2015-10-20 DIAGNOSIS — L570 Actinic keratosis: Secondary | ICD-10-CM | POA: Diagnosis not present

## 2015-10-20 DIAGNOSIS — D239 Other benign neoplasm of skin, unspecified: Secondary | ICD-10-CM | POA: Diagnosis not present

## 2015-10-21 DIAGNOSIS — M25511 Pain in right shoulder: Secondary | ICD-10-CM | POA: Diagnosis not present

## 2015-10-23 DIAGNOSIS — M25511 Pain in right shoulder: Secondary | ICD-10-CM | POA: Diagnosis not present

## 2015-10-24 DIAGNOSIS — M25511 Pain in right shoulder: Secondary | ICD-10-CM | POA: Diagnosis not present

## 2015-11-04 ENCOUNTER — Ambulatory Visit: Payer: Medicare Other | Admitting: Internal Medicine

## 2015-11-10 ENCOUNTER — Encounter: Payer: Self-pay | Admitting: Family Medicine

## 2015-11-10 ENCOUNTER — Ambulatory Visit (INDEPENDENT_AMBULATORY_CARE_PROVIDER_SITE_OTHER): Payer: Medicare Other | Admitting: Family Medicine

## 2015-11-10 VITALS — BP 126/78 | HR 52 | Ht 70.5 in | Wt 197.8 lb

## 2015-11-10 DIAGNOSIS — Z0000 Encounter for general adult medical examination without abnormal findings: Secondary | ICD-10-CM

## 2015-11-10 DIAGNOSIS — E119 Type 2 diabetes mellitus without complications: Secondary | ICD-10-CM

## 2015-11-10 DIAGNOSIS — E038 Other specified hypothyroidism: Secondary | ICD-10-CM | POA: Diagnosis not present

## 2015-11-10 DIAGNOSIS — I10 Essential (primary) hypertension: Secondary | ICD-10-CM | POA: Diagnosis not present

## 2015-11-10 DIAGNOSIS — Z7189 Other specified counseling: Secondary | ICD-10-CM | POA: Diagnosis not present

## 2015-11-10 DIAGNOSIS — E785 Hyperlipidemia, unspecified: Secondary | ICD-10-CM | POA: Diagnosis not present

## 2015-11-10 DIAGNOSIS — B356 Tinea cruris: Secondary | ICD-10-CM

## 2015-11-10 MED ORDER — TERBINAFINE HCL 1 % EX CREA: TOPICAL_CREAM | CUTANEOUS | 3 refills | Status: DC

## 2015-11-10 NOTE — Patient Instructions (Addendum)
- Strongly encouraged patient to walk 30 minutes daily. Goal 10,000 steps daily  -  AHA guidelines for exercise of 150 minutes of moderate intensity aerobic activity per week discussed and encouraged.   Regular exercise will improve brain function and memory, as well as improve mood, boost immune system and help with weight management, among the other, more well-known effects of exercise such as decreasing risk for hypertension, diabetes, hyperlipidemia etc.  -  The AHA strongly endorses consumption of a diet that contains a variety of foods from all the food categories with an emphasis on fruits and vegetables; fat-free and low-fat dairy products; cereal and grain products; legumes and nuts; and fish, poultry, and or lean meats.   Excessive food intake, especially of foods high in saturated and trans fats, sugar, and salt, should be avoided    Jock Itch Jock itch (tinea cruris) is a fungal infection of the skin in the groin area. It is sometimes called ringworm, even though it is not caused by worms. It is caused by a fungus, which is a type of germ that thrives in dark, damp places. Jock itch causes a rash and itching in the groin and upper thigh area. It usually goes away in 2-3 weeks with treatment. CAUSES The fungus that causes jock itch may be spread by:  Touching a fungus infection elsewhere on your body--such as athlete's foot--and then touching your groin area.  Sharing towels or clothing with an infected person. RISK FACTORS Jock itch is most common in men and adolescent boys. This condition is more likely to develop from:  Being in hot, humid climates.  Wearing tight-fitting clothing or wet bathing suits for long periods of time.  Participating in sports.  Being overweight.  Having diabetes. SYMPTOMS Symptoms of jock itch may include:  A red, pink, or brown rash in the groin area. The rash may spread to the thighs, anus, and buttocks.  Dry and scaly skin on or around the  rash.  Itchiness. DIAGNOSIS Most often, a health care provider can make the diagnosis by looking at your rash. Sometimes, a scraping of the infected skin will be taken. This sample may be tested by looking at it under a microscope or by trying to grow the fungus from the sample (culture).  TREATMENT Treatment for this condition may include:  Antifungal medicine to kill the fungus. This may be in various forms:  Skin cream or ointment.  Medicine taken by mouth.  Skin cream or ointment to reduce the itching.  Compresses or medicated powders to dry the infected skin. HOME CARE INSTRUCTIONS  Take medicines only as directed by your health care provider. Apply skin creams or ointments exactly as directed.  Wear loose-fitting clothing.  Men should wear cotton boxer shorts.  Women should wear cotton underwear.  Change your underwear every day to keep your groin dry.  Avoid hot baths.  Dry your groin area well after bathing.  Use a separate towel to dry your groin area. This will help to prevent a spreading of the infection to other areas of your body.  Do not scratch the affected area.  Do not share towels with other people. SEEK MEDICAL CARE IF:  Your rash does not improve or it gets worse after 2 weeks of treatment.  Your rash is spreading.  Your rash returns after treatment is finished.  You have a fever.  You have redness, swelling, or pain in the area around your rash.  You have fluid, blood, or  pus coming from your rash.  Your have your rash for more than 4 weeks.   This information is not intended to replace advice given to you by your health care provider. Make sure you discuss any questions you have with your health care provider.   Document Released: 02/05/2002 Document Revised: 03/08/2014 Document Reviewed: 11/27/2013 Elsevier Interactive Patient Education [employer].

## 2015-11-10 NOTE — Progress Notes (Signed)
Male physical  Impression and Recommendations:    1. Encounter for routine history and physical exam for male   2. Diet controlled Type II DM   3. Hyperlipidemia   4. Jock itch   5. Other specified hypothyroidism   6. Essential hypertension   7. Counseling on health promotion and disease prevention     1) a) Anticipatory Guidance/ Counseling performed regarding:  - wearing a seatbelt while driving  - sunscreen when outside along with regular skin surveillance, noting any change in size, color or symptoms of specific skin lesions - eating a balanced and modest diet which is low in saturated and Transfats, high in fruits and vegetables and lean proteins - physical activity at least 25 minutes per day or 150 min/ week of moderate to intense aerobic activity.  b) Immunizations / Screenings / Labs:  Immunization counseling performed.   All immunizations are up-to-date per recommendations or will be updated today as allowed by patient.   c) Weight:   Discussed goal of losing 5-10% of current body weight which would improve overall feelings of well being and improve objective health data. Improve nutrient density of diet through increasing intake of fruits and vegetables and decreasing saturated fats, white flour products and refined sugars.   2) DM- stable. 6.3 when last checked.  Check Urine microalb:crt ratio  3) low saturated and transplant diet, continue fish oil.   Been off his Crestor since 6/ 6 /17- strict diet controlled. Will reck 4 months since off meds - FLP in OCT   4) terbinafine twice a day until rash resolves. Keep dry and may use jock itch powder as needed once rash resolves to keep dry  5)  Will need recheck of his thyroid levels mid-October ->  6-8 weeks since dose change.  6) HTN- good control; continue ARB  Follow-up preventative CPE in 1 year. F/up sooner for chronic care management as previously determined and/or prn    Orders Placed This Encounter    Procedures  . Microalbumin / creatinine urine ratio    Patient's Medications  New Prescriptions   TERBINAFINE (LAMISIL) 1 % CREAM    Apply to affected area BID until rash gone, then apply 2 more days.  Previous Medications   ACETAMINOPHEN (TYLENOL) 500 MG TABLET    Take 500 mg by mouth every 8 (eight) hours as needed for mild pain.    ASPIRIN EC 81 MG TABLET    Take 81 mg by mouth daily.   CALCIUM-MAGNESIUM-ZINC PO    Take 1 tablet by mouth daily.   CELECOXIB (CELEBREX) 200 MG CAPSULE    Take 200 mg by mouth every other day.    CYANOCOBALAMIN (VITAMIN B-12 PO)    Take 1 tablet by mouth daily.   FLUOCINONIDE (LIDEX) 0.05 % EXTERNAL SOLUTION    1 application. 1 drop to left ear as needed   GLUCOSE BLOOD (PRECISION XTRA TEST STRIPS) TEST STRIP    USE TO CHECK BLOOD SUGAR ONCE DAILY DX E11.9   HYPROMELLOSE (ARTIFICIAL TEARS OP)    Place 1 drop into both eyes 2 (two) times daily.   LEVOTHYROXINE (SYNTHROID, LEVOTHROID) 125 MCG TABLET    Take every other day (interchanging with the 163mcg dose)   LEVOTHYROXINE (SYNTHROID, LEVOTHROID) 150 MCG TABLET    Take every other day (interchanging with the 186mcg dose)   LOSARTAN (COZAAR) 50 MG TABLET    TAKE ONE-HALF (1/2) TABLET EVERY OTHER DAY   MULTIPLE VITAMIN (MULTIVITAMIN WITH MINERALS)  TABS    Take 1 tablet by mouth daily.   NONFORMULARY OR COMPOUNDED ITEM    Glucometer (Precision XTRA- Model #[id number])   OMEGA-3 FATTY ACIDS (FISH OIL PO)    Take 1 capsule by mouth daily.   PROBIOTIC PRODUCT (RESTORA) CAPS    One tab qd   TAMSULOSIN HCL (FLOMAX) 0.4 MG CAPS    Take 0.4 mg by mouth 2 (two) times daily.   Modified Medications   No medications on file  Discontinued Medications   No medications on file   Please see AVS handed out to patient at the end of our visit for further patient instructions/ counseling done pertaining to today's office visit.     Subjective:    CC: CPE  HPI: Kyle Simon is a 82 y.o. male who presents to [place] at [facility] today for a yearly health maintenance exam.    CC: Patient complains of groin rash that comes and goes worse in the summer especially when sweating. It itches as well. Patient has just been using some over-the-counter skin cream, not antifungal on it with no resolution.  Health Maintenance Summary Reviewed and updated, unless pt declines services. Tobacco History Reviewed: quit [place] yrs about 1 ppd. Alcohol: No concerns, no excessive use Exercise Habits: no regular walking but does "calestenics"  5,000steps 3/wk with yard work.   STD concerns: none Drug Use:  None Birth control method: na Testicular/penile concerns: has jock itch Cancer Family History: brother- died age 51 of "hip CA" Dr Dagmar Hait- ENT- yrly Recently had DM eye exam- will get [place] results. Goes twice yrly.  Skin doc- gets yrly skin screenings  Dr Risa Grill- rectal annually- declines rectal today     Wt Readings from Last 3 Encounters:  11/10/15 197 lb 12.8 oz (89.7 kg)  08/22/15 193 lb 4.8 oz (87.7 kg)  08/05/15 203 lb 3.2 oz (92.2 kg)   BP Readings from Last 3 Encounters:  11/10/15 126/78  08/22/15 123/74  08/05/15 (!) 150/75   Pulse Readings from Last 3 Encounters:  11/10/15 (!) 52  08/22/15 (!) 56  08/05/15 (!) 56    Patient Active Problem List   Diagnosis Date Noted  . Cerumen impaction 08/25/2015  . Diabetes mellitus without complication ([place]) 123456  . Flatulence symptom 07/25/2015  . Diarrhea 06/21/2015  . Chronic rhinitis 11/01/2014  . Essential hypertension 03/20/2014  . Spondylolisthesis of lumbar region 10/23/2013  . Diverticulosis of colon without hemorrhage 01/22/2013  . B12 deficiency anemia 01/22/2013  . Anemia, unspecified 08/13/2012  . Synovial cyst of lumbar spine 07/26/2011  . Spasms of the hands or feet 07/26/2011  . GERD (gastroesophageal reflux disease) 03/26/2011  . SENILE KERATOSIS 06/12/2008  . Hyperlipidemia 04/19/2007  . SNORING,  HX OF 04/19/2007  . BPH (benign prostatic hyperplasia) 04/18/2007  . Hypothyroidism 04/20/2006    Past Medical History:  Diagnosis Date  . Arthritis    BOTH KNEES and hands;Dr.Deveshwar  . Constipation due to pain medication   . Enlarged prostate   . Frequent urination at night   . GERD (gastroesophageal reflux disease)   . Hyperlipidemia   . Hypothyroidism   . Polio    as a child w/o complications  . Snoring    no sleep apnea  . Transfusion history 2012   post TKR    Past Surgical History:  Procedure Laterality Date  . BACK SURGERY  2011   hemiarthroplasty 01/11/1999 and 6  . CARDIAC CATHETERIZATION  09/2001   negative  . EYE SURGERY  2003   R&L cataract removed & IOL- 2003 & 2007,Dr.Jenkins  . INGUINAL HERNIA REPAIR  2002   right, Dr. Truitt Leep  . JOINT REPLACEMENT  2012   left knee replacement  . LIPOMA EXCISION     back  . MENISCECTOMY Bilateral    Dr. Noemi Chapel  . PROSTATE SURGERY  07/2006   for urinary urgency, Dr. Risa Grill  . ROTATOR CUFF REPAIR Right October, 2016  . sinus & throat surgery-1995  1995  . TONSILLECTOMY    . TOTAL KNEE ARTHROPLASTY  01/11/2011   Procedure: TOTAL KNEE ARTHROPLASTY;  Surgeon: Rudean Haskell, MD;  Location: [facility];  Service: Orthopedics;  Laterality: Left;  . UVULECTOMY  [id number]   Dr. Erik Obey  . VASECTOMY    . WRIST SURGERY  1959   fracture- right    Family History  Problem Relation Age of Onset  . Sudden death Father     MVA  . Coronary artery disease Mother   . Emphysema Mother   . Diverticulosis Mother   . Asthma Brother   . Bone cancer Brother   . Diabetes Paternal Grandmother   . Sarcoidosis Son   . Anesthesia problems Neg Hx   . Hypotension Neg Hx   . Malignant hyperthermia Neg Hx   . Pseudochol deficiency Neg Hx   . Colon cancer Neg Hx     History  Drug Use No  ,  History  Alcohol Use  . 2.4 oz/week  . 4 Shots of liquor per week    Comment: social  ,  History  Smoking Status  . Former Smoker  . Quit  date: 03/01/1966  Smokeless Tobacco  . Never Used  ,  History  Sexual Activity  . Sexual activity: Yes    Patient's Medications  New Prescriptions   TERBINAFINE (LAMISIL) 1 % CREAM    Apply to affected area BID until rash gone, then apply 2 more days.  Previous Medications   ACETAMINOPHEN (TYLENOL) 500 MG TABLET    Take 500 mg by mouth every 8 (eight) hours as needed for mild pain.    ASPIRIN EC 81 MG TABLET    Take 81 mg by mouth daily.   CALCIUM-MAGNESIUM-ZINC PO    Take 1 tablet by mouth daily.   CELECOXIB (CELEBREX) 200 MG CAPSULE    Take 200 mg by mouth every other day.    CYANOCOBALAMIN (VITAMIN B-12 PO)    Take 1 tablet by mouth daily.   FLUOCINONIDE (LIDEX) 0.05 % EXTERNAL SOLUTION    1 application. 1 drop to left ear as needed   GLUCOSE BLOOD (PRECISION XTRA TEST STRIPS) TEST STRIP    USE TO CHECK BLOOD SUGAR ONCE DAILY DX E11.9   HYPROMELLOSE (ARTIFICIAL TEARS OP)    Place 1 drop into both eyes 2 (two) times daily.   LEVOTHYROXINE (SYNTHROID, LEVOTHROID) 125 MCG TABLET    Take every other day (interchanging with the 170mcg dose)   LEVOTHYROXINE (SYNTHROID, LEVOTHROID) 150 MCG TABLET    Take every other day (interchanging with the 158mcg dose)   LOSARTAN (COZAAR) 50 MG TABLET    TAKE ONE-HALF (1/2) TABLET EVERY OTHER DAY   MULTIPLE VITAMIN (MULTIVITAMIN WITH MINERALS) TABS    Take 1 tablet by mouth daily.   NONFORMULARY OR COMPOUNDED ITEM    Glucometer (Precision XTRA- Model #[id number])   OMEGA-3 FATTY ACIDS (FISH OIL PO)    Take 1 capsule by mouth daily.  PROBIOTIC PRODUCT (RESTORA) CAPS    One tab qd   TAMSULOSIN HCL (FLOMAX) 0.4 MG CAPS    Take 0.4 mg by mouth 2 (two) times daily.   Modified Medications   No medications on file  Discontinued Medications   No medications on file    Simvastatin and Oxycodone  Review of Systems:   ( Completed via her adult medical history intake form today ) General:  Denies fever, chills, appetite changes, unexplained weight loss.    Respiratory: Denies SOB, DOE, cough, wheezing.  Cardiovascular: Denies chest pain, palpitations.  Gastrointestinal: Denies nausea, vomiting, diarrhea, abdominal pain.  Genitourinary: Denies dysuria, increased frequency, flank pain. Endocrine: Denies hot or cold intolerance, polyuria, polydipsia. Musculoskeletal: Denies myalgias, back pain, joint swelling, arthralgias, gait problems.  Skin: Denies pallor, rash, suspicious lesions.  Neurological: Denies dizziness, seizures, syncope, unexplained weakness, lightheadedness, numbness and headaches.  Psychiatric/Behavioral: Denies mood changes, suicidal or homicidal ideations, hallucinations, sleep disturbances.   Objective:    Blood pressure 126/78, pulse (!) 52, height 5' 10.5" (1.791 m), weight 197 lb 12.8 oz (89.7 kg).  Body mass index is 27.98 kg/m. General Appearance:    Alert, cooperative, no distress, appears stated age    Head:    Normocephalic, without obvious abnormality, atraumatic  Eyes:    PERRL, conjunctiva/corneas clear, EOM's intact, fundi grossly   benign, both eyes  Ears:    Normal TM's and external ear canals, both ears  Nose:   Nares normal, septum midline, mucosa normal, no drainage    or sinus tenderness  Throat:   Lips w/o lesion, mucosa moist, and tongue normal; teeth and gums normal  Neck:   Supple, symmetrical, trachea midline, no adenopathy;    thyroid:  no enlargement/tenderness/nodules; no carotid   bruit or JVD  Back:     Symmetric, no curvature, ROM normal, no CVA tenderness  Lungs:     Clear to auscultation bilaterally, respirations unlabored, no Wh/ R/ R  Chest Wall:    No tenderness or gross deformity; normal excursion   Heart:    Regular rate and rhythm, S1 and S2 normal, no murmur, rub   or gallop  Abdomen:     Soft, non-tender, bowel sounds active all four quadrants, NO G/R/R, no masses, no organomegaly  Genitalia:    Ext genitalia: without lesion, no penile rash or discharge, but rash in groin  region b/l- erythematous, ringworm type appearance, no hernias appreciated   Rectal:    deferred  Extremities:   Extremities normal, atraumatic, no cyanosis or gross edema  Pulses:   2+ and symmetric all extremities  Skin:   Warm, dry, Skin color, texture, turgor normal, no obvious rashes or lesions  M-Sk:   Ambulates * 4 w/o difficulty, no gross deformities, tone WNL  Neurologic:   CNII-XII intact, normal strength, sensation and reflexes    Throughout Psych:  No HI/SI, judgement and insight good, Euthymic mood. Full Affect.     Recent Results (from the past 2160 hour(s))  TSH     Status: None   Collection Time: 10/17/15  8:05 AM  Result Value Ref Range   TSH 3.45 0.40 - 4.50 mIU/L  T3, free     Status: None   Collection Time: 10/17/15  8:41 AM  Result Value Ref Range   T3, Free 2.7 2.3 - 4.2 pg/mL  T4, free     Status: None   Collection Time: 10/17/15  8:41 AM  Result Value Ref Range   Free T4 1.5  0.8 - 1.8 ng/dL  HgB A1c     Status: Abnormal   Collection Time: 10/17/15  8:41 AM  Result Value Ref Range   Hgb A1c MFr Bld 6.3 (H) <5.7 %    Comment:   For someone without known diabetes, a hemoglobin A1c value between 5.7% and 6.4% is consistent with prediabetes and should be confirmed with a follow-up test.   For someone with known diabetes, a value <7% indicates that their diabetes is well controlled. A1c targets should be individualized based on duration of diabetes, age, co-morbid conditions and other considerations.   This assay result is consistent with an increased risk of diabetes.   Currently, no consensus exists regarding use of hemoglobin A1c for diagnosis of diabetes in children.      Mean Plasma Glucose 134 mg/dL

## 2015-11-11 LAB — MICROALBUMIN / CREATININE URINE RATIO
Creatinine, Urine: 108 mg/dL (ref 20–370)
Microalb Creat Ratio: 3 mcg/mg creat (ref <30)
Microalb, Ur: 0.3 mg/dL

## 2015-11-13 DIAGNOSIS — Z9889 Other specified postprocedural states: Secondary | ICD-10-CM | POA: Diagnosis not present

## 2015-12-16 ENCOUNTER — Other Ambulatory Visit (INDEPENDENT_AMBULATORY_CARE_PROVIDER_SITE_OTHER): Payer: Medicare Other

## 2015-12-16 DIAGNOSIS — E038 Other specified hypothyroidism: Secondary | ICD-10-CM

## 2015-12-17 ENCOUNTER — Other Ambulatory Visit: Payer: Medicare Other

## 2015-12-17 LAB — TSH: TSH: 4.31 mIU/L (ref 0.40–4.50)

## 2015-12-25 DIAGNOSIS — M25561 Pain in right knee: Secondary | ICD-10-CM | POA: Diagnosis not present

## 2015-12-25 DIAGNOSIS — M1711 Unilateral primary osteoarthritis, right knee: Secondary | ICD-10-CM | POA: Diagnosis not present

## 2015-12-29 ENCOUNTER — Encounter: Payer: Self-pay | Admitting: Family Medicine

## 2015-12-29 ENCOUNTER — Ambulatory Visit (INDEPENDENT_AMBULATORY_CARE_PROVIDER_SITE_OTHER): Payer: Medicare Other | Admitting: Family Medicine

## 2015-12-29 VITALS — BP 148/76 | HR 80 | Ht 70.5 in | Wt 190.8 lb

## 2015-12-29 DIAGNOSIS — E119 Type 2 diabetes mellitus without complications: Secondary | ICD-10-CM

## 2015-12-29 DIAGNOSIS — I10 Essential (primary) hypertension: Secondary | ICD-10-CM | POA: Diagnosis not present

## 2015-12-29 DIAGNOSIS — E039 Hypothyroidism, unspecified: Secondary | ICD-10-CM

## 2015-12-29 DIAGNOSIS — R059 Cough, unspecified: Secondary | ICD-10-CM

## 2015-12-29 DIAGNOSIS — E871 Hypo-osmolality and hyponatremia: Secondary | ICD-10-CM | POA: Insufficient documentation

## 2015-12-29 DIAGNOSIS — Z79899 Other long term (current) drug therapy: Secondary | ICD-10-CM

## 2015-12-29 DIAGNOSIS — R05 Cough: Secondary | ICD-10-CM

## 2015-12-29 NOTE — Progress Notes (Signed)
Assessment and plan:  1. Hypothyroidism, unspecified type   2. Essential hypertension   3. Cough   4. Diabetes mellitus without complication ([place])   5. Hypomagnesemia   6. h/o Hyponatremia   7. High risk medications (not anticoagulants) long-term use    Hypothyroidism On day 1- take 150, day 2- take 150 then day 3- take 125. Repeat.   ---> so it will be 116mcg, 16mcg, 169mcg, 167mcg, 198mcg, 161mcg-->  You will only take the 188mcg tabs every third day only.   Reck levels in 6-8 wks at new dose.     In 6-8 weeks I will send you a message through my chart on whether or not the level is appropriate and what the next step may be if anything.  Cough 4 your cough if this continues and does not improve over time, I will want you to get a chest x-ray and or chest CT scan in the future to investigate further. However if you continue to get better, then no need to do that.  BPH (benign prostatic hyperplasia) Pt on flomax  Essential hypertension Is controlled---> goal less than 150/90 regularly.    monitor at home/ pharmacy.    Orders Placed This Encounter  Procedures  . T4, free  . TSH  . Hemoglobin A1c  . Magnesium  . Phosphorus  . Potassium  . Sodium    New Prescriptions   No medications on file    Modified Medications   No medications on file    Discontinued Medications   LEVOTHYROXINE (SYNTHROID, LEVOTHROID) 125 MCG TABLET    Take every other day (interchanging with the 130mcg dose)   LEVOTHYROXINE (SYNTHROID, LEVOTHROID) 150 MCG TABLET    Take every other day (interchanging with the 119mcg dose)   TERBINAFINE (LAMISIL) 1 % CREAM    Apply to affected area BID until rash gone, then apply 2 more days.    Return in about 6 weeks (around 02/09/2016) for around 12/18--> come in for bldwrk- tsh, free t4, A1c, Mag, Phos, Na.  Anticipatory guidance and routine counseling done re: condition, txmnt  options and need for follow up. All questions of patient's were answered.   Gross side effects, risk and benefits, and alternatives of medications discussed with patient.  Patient is aware that all medications have potential side effects and we are unable to predict every sideeffect or drug-drug interaction that may occur.  Expresses verbal understanding and consents to current therapy plan and treatment regiment.  Please see AVS handed out to patient at the end of our visit for additional patient instructions/ counseling done pertaining to today's office visit.  Note: This document was prepared using Dragon voice recognition software and may include unintentional dictation errors.   ----------------------------------------------------------------------------------------------------------------------  Subjective:   CC:   Kyle Simon is a 82 y.o. male who presents to [place] at [facility] today for review and discussion of recent bloodwork that was done- repeat TSH level.  1.  Pt has been on 139mcg, then 16mcg every other day since 08/05/15.  Repeat  Patient states he has a lot of the 150 g tablets left.   Since his TSH had gone up, I would like his dose to be a little bit stronger than what he is on currently.   He also tells me he can tell a diff in his energy levels.     HTN: Patient states he forgot to take his blood pressure medication  today, notes why his blood pressures little up, no Sx  No other complaints/ concerns    Wt Readings from Last 3 Encounters:  12/29/15 190 lb 12.8 oz (86.5 kg)  11/10/15 197 lb 12.8 oz (89.7 kg)  08/22/15 193 lb 4.8 oz (87.7 kg)   BP Readings from Last 3 Encounters:  12/29/15 (!) 148/76  11/10/15 126/78  08/22/15 123/74   Pulse Readings from Last 3 Encounters:  12/29/15 80  11/10/15 (!) 52  08/22/15 (!) 56   BMI Readings from Last 3 Encounters:  12/29/15 26.99 kg/m  11/10/15 27.98 kg/m  08/22/15 27.34 kg/m      Patient Care Team    Relationship Specialty Notifications Start End  Mellody Dance, DO PCP - General Family Medicine  08/05/15   Rana Snare, MD Consulting Physician Urology  11/10/15   Lavonna Monarch, MD Consulting Physician Dermatology  [id number]   Jodi Marble, MD Consulting Physician Otolaryngology  11/10/15   Calton Dach, MD Referring Physician Optometry  11/10/15   Inocencio Homes, DPM Consulting Physician Podiatry  11/10/15   Bo Merino, MD Consulting Physician Rheumatology  11/10/15   Vickey Huger, MD Consulting Physician Orthopedic Surgery  11/10/15   Erline Levine, MD Consulting Physician Neurosurgery  11/10/15   Deboraha Sprang, MD Consulting Physician Cardiology  01/04/16    Comment: seen '12 last    Full medical history updated and reviewed in the office today  Patient Active Problem List   Diagnosis Date Noted  . Cough 01/04/2016  . High risk medications (not anticoagulants) long-term use 01/04/2016  . Hypomagnesemia 12/29/2015  . h/o Hyponatremia 12/29/2015  . Cerumen impaction 08/25/2015  . Diabetes mellitus without complication ([place]) 123456  . Flatulence symptom 07/25/2015  . Diarrhea 06/21/2015  . Chronic rhinitis 11/01/2014  . Essential hypertension 03/20/2014  . Spondylolisthesis of lumbar region 10/23/2013  . Diverticulosis of colon without hemorrhage 01/22/2013  . B12 deficiency anemia 01/22/2013  . Anemia, unspecified 08/13/2012  . Synovial cyst of lumbar spine 07/26/2011  . Spasms of the hands or feet 07/26/2011  . GERD (gastroesophageal reflux disease) 03/26/2011  . SENILE KERATOSIS 06/12/2008  . Hyperlipidemia 04/19/2007  . SNORING, HX OF 04/19/2007  . BPH (benign prostatic hyperplasia) 04/18/2007  . Hypothyroidism 04/20/2006    Past Medical History:  Diagnosis Date  . Arthritis    BOTH KNEES and hands;Dr.Deveshwar  . Constipation due to pain medication   . Enlarged prostate   . Frequent urination at night   . GERD (gastroesophageal  reflux disease)   . Hyperlipidemia   . Hypothyroidism   . Polio    as a child w/o complications  . Snoring    no sleep apnea  . Transfusion history 2012   post TKR    Past Surgical History:  Procedure Laterality Date  . BACK SURGERY  2011   hemiarthroplasty 01/11/1999 and 6  . CARDIAC CATHETERIZATION  09/2001   negative  . EYE SURGERY  2003   R&L cataract removed & IOL- 2003 & 2007,Dr.Jenkins  . INGUINAL HERNIA REPAIR  2002   right, Dr. Truitt Leep  . JOINT REPLACEMENT  2012   left knee replacement  . LIPOMA EXCISION     back  . MENISCECTOMY Bilateral    Dr. Noemi Chapel  . PROSTATE SURGERY  07/2006   for urinary urgency, Dr. Risa Grill  . ROTATOR CUFF REPAIR Right October, 2016  . sinus & throat surgery-1995  1995  . TONSILLECTOMY    . TOTAL KNEE ARTHROPLASTY  01/11/2011  Procedure: TOTAL KNEE ARTHROPLASTY;  Surgeon: Rudean Haskell, MD;  Location: [facility];  Service: Orthopedics;  Laterality: Left;  . UVULECTOMY  [id number]   Dr. Erik Obey  . VASECTOMY    . WRIST SURGERY  1959   fracture- right    Social History  Substance Use Topics  . Smoking status: Former Smoker    Packs/day: 1.00    Years: 18.00    Quit date: 03/01/1966  . Smokeless tobacco: Never Used  . Alcohol use 2.4 oz/week    4 Shots of liquor per week     Comment: social    Family Hx: Family History  Problem Relation Age of Onset  . Sudden death Father     MVA  . Coronary artery disease Mother   . Emphysema Mother   . Diverticulosis Mother   . Asthma Brother   . Bone cancer Brother   . Diabetes Paternal Grandmother   . Sarcoidosis Son   . Anesthesia problems Neg Hx   . Hypotension Neg Hx   . Malignant hyperthermia Neg Hx   . Pseudochol deficiency Neg Hx   . Colon cancer Neg Hx      Medications: Current Outpatient Prescriptions  Medication Sig Dispense Refill  . acetaminophen (TYLENOL) 500 MG tablet Take 500 mg by mouth every 8 (eight) hours as needed for mild pain.     Marland Kitchen aspirin EC 81 MG tablet Take  81 mg by mouth daily.    Marland Kitchen CALCIUM-MAGNESIUM-ZINC PO Take 1 tablet by mouth daily.    . celecoxib (CELEBREX) 200 MG capsule Take 200 mg by mouth every other day.     . Cyanocobalamin (VITAMIN B-12 PO) Take 1 tablet by mouth daily.    . fluocinonide (LIDEX) 0.05 % external solution 1 application. 1 drop to left ear as needed    . glucose blood (PRECISION XTRA TEST STRIPS) test strip USE TO CHECK BLOOD SUGAR ONCE DAILY DX E11.9 100 each 2  . Hypromellose (ARTIFICIAL TEARS OP) Place 1 drop into both eyes 2 (two) times daily.    Marland Kitchen losartan (COZAAR) 50 MG tablet TAKE ONE-HALF (1/2) TABLET EVERY OTHER DAY 90 tablet 1  . Multiple Vitamin (MULTIVITAMIN WITH MINERALS) TABS Take 1 tablet by mouth daily.    . NONFORMULARY OR COMPOUNDED ITEM Glucometer (Precision XTRA- Model #XCC [id number]) 1 each 0  . Omega-3 Fatty Acids (FISH OIL PO) Take 1 capsule by mouth daily.    . Probiotic Product (RESTORA) CAPS One tab qd 90 capsule 3  . Tamsulosin HCl (FLOMAX) 0.4 MG CAPS Take 0.4 mg by mouth 2 (two) times daily.      No current facility-administered medications for this visit.     Allergies:  Allergies  Allergen Reactions  . Simvastatin Other (See Comments)    Zocor=Muscle cramps  . Oxycodone Other (See Comments)    Severe constipation     ROS: Review of Systems  Constitutional: Negative.  Negative for chills, diaphoresis, fever, malaise/fatigue and weight loss.  HENT: Negative for congestion, sore throat and tinnitus.        Recheck itching in ears  Eyes: Negative.  Negative for blurred vision, double vision and photophobia.  Respiratory: Positive for cough and sputum production. Negative for wheezing.   Cardiovascular: Negative.  Negative for chest pain and palpitations.  Gastrointestinal: Negative.  Negative for blood in stool, diarrhea, nausea and vomiting.  Genitourinary: Negative.  Negative for dysuria, frequency and urgency.  Musculoskeletal: Negative.  Negative for joint pain and myalgias.  Skin: Positive for itching. Negative for rash.       Right testicular area  Neurological: Negative.  Negative for dizziness, focal weakness, weakness and headaches.  Endo/Heme/Allergies: Negative.  Negative for environmental allergies and polydipsia. Does not bruise/bleed easily.  Psychiatric/Behavioral: Negative.  Negative for depression and memory loss. The patient is not nervous/anxious and does not have insomnia.    Objective:  Blood pressure (!) 148/76, pulse 80, height 5' 10.5" (1.791 m), weight 190 lb 12.8 oz (86.5 kg). Body mass index is 26.99 kg/m. Gen:   Well NAD, A and O *3 HEENT:    [place]/AT, EOMI,  MMM, OP- clr, TM's- WNL's B/L. Lungs:   Normal work of breathing. ECTA B/L, no Wh, rhonchi, rales Heart:   RRR, S1, S2 WNL's, no MRG Abd:   No gross distention Exts:    warm, pink,  Brisk capillary refill, warm and well perfused.  Psych:    No HI/SI, judgement and insight good, Euthymic mood. Full Affect.   Recent Results (from the past 2160 hour(s))  TSH     Status: None   Collection Time: 10/17/15  8:05 AM  Result Value Ref Range   TSH 3.45 0.40 - 4.50 mIU/L  T3, free     Status: None   Collection Time: 10/17/15  8:41 AM  Result Value Ref Range   T3, Free 2.7 2.3 - 4.2 pg/mL  T4, free     Status: None   Collection Time: 10/17/15  8:41 AM  Result Value Ref Range   Free T4 1.5 0.8 - 1.8 ng/dL  HgB A1c     Status: Abnormal   Collection Time: 10/17/15  8:41 AM  Result Value Ref Range   Hgb A1c MFr Bld 6.3 (H) <5.7 %    Comment:   For someone without known diabetes, a hemoglobin A1c value between 5.7% and 6.4% is consistent with prediabetes and should be confirmed with a follow-up test.   For someone with known diabetes, a value <7% indicates that their diabetes is well controlled. A1c targets should be individualized based on duration of diabetes, age, co-morbid conditions and other considerations.   This assay result is consistent with an increased risk of  diabetes.   Currently, no consensus exists regarding use of hemoglobin A1c for diagnosis of diabetes in children.      Mean Plasma Glucose 134 mg/dL  Microalbumin / creatinine urine ratio     Status: None   Collection Time: 11/10/15  9:00 AM  Result Value Ref Range   Creatinine, Urine 108 20 - 370 mg/dL   Microalb, Ur 0.3 Not estab mg/dL   Microalb Creat Ratio 3 <30 mcg/mg creat    Comment: The ADA has defined abnormalities in albumin excretion as follows:           Category           Result                            (mcg/mg creatinine)                 Normal:    <30       Microalbuminuria:    30 - 299   Clinical albuminuria:    > or = 300   The ADA recommends that at least two of three specimens collected within a 3 - 6 month period be abnormal before considering a patient to be within a diagnostic category.  TSH     Status: None   Collection Time: 12/16/15  8:24 AM  Result Value Ref Range   TSH 4.31 0.40 - 4.50 mIU/L

## 2015-12-29 NOTE — Patient Instructions (Addendum)
On day 1 take 150, day 2 take 150 then day 3- take 125  ---> so it will be 14mcg, 172mcg, 137mcg, 115mcg, 183mcg, 171mcg-->  You will only take the 1`73mcg tabs every third day only.   Reck levels in 6-8 wks at new dose.     In 6-8 weeks I will send you a message through my chart on whether or not the level is appropriate and what the next step may be if anything.    4 your cough if this continues and does not improve over time, I will want you to get a chest x-ray and or chest CT scan in the future to investigate further. However if you continue to get better, then no need to do that.

## 2016-01-04 DIAGNOSIS — Z79899 Other long term (current) drug therapy: Secondary | ICD-10-CM | POA: Insufficient documentation

## 2016-01-04 DIAGNOSIS — R059 Cough, unspecified: Secondary | ICD-10-CM | POA: Insufficient documentation

## 2016-01-04 DIAGNOSIS — R05 Cough: Secondary | ICD-10-CM | POA: Insufficient documentation

## 2016-01-04 NOTE — Assessment & Plan Note (Addendum)
On day 1- take 150, day 2- take 150 then day 3- take 125. Repeat.   ---> so it will be 128mcg, 11mcg, 174mcg, 186mcg, 165mcg, 155mcg-->  You will only take the 162mcg tabs every third day only.   Reck levels in 6-8 wks at new dose.     In 6-8 weeks I will send you a message through my chart on whether or not the level is appropriate and what the next step may be if anything.

## 2016-01-04 NOTE — Assessment & Plan Note (Signed)
Pt on flomax

## 2016-01-04 NOTE — Assessment & Plan Note (Signed)
Is controlled---> goal less than 150/90 regularly.    monitor at home/ pharmacy.

## 2016-01-04 NOTE — Assessment & Plan Note (Signed)
4 your cough if this continues and does not improve over time, I will want you to get a chest x-ray and or chest CT scan in the future to investigate further. However if you continue to get better, then no need to do that.

## 2016-01-04 NOTE — Assessment & Plan Note (Deleted)
Diet controlled.  

## 2016-01-07 DIAGNOSIS — Z961 Presence of intraocular lens: Secondary | ICD-10-CM | POA: Diagnosis not present

## 2016-01-07 DIAGNOSIS — H40012 Open angle with borderline findings, low risk, left eye: Secondary | ICD-10-CM | POA: Diagnosis not present

## 2016-01-07 DIAGNOSIS — E119 Type 2 diabetes mellitus without complications: Secondary | ICD-10-CM | POA: Diagnosis not present

## 2016-01-07 DIAGNOSIS — H40002 Preglaucoma, unspecified, left eye: Secondary | ICD-10-CM | POA: Diagnosis not present

## 2016-01-07 DIAGNOSIS — H11423 Conjunctival edema, bilateral: Secondary | ICD-10-CM | POA: Diagnosis not present

## 2016-01-07 DIAGNOSIS — Z9849 Cataract extraction status, unspecified eye: Secondary | ICD-10-CM | POA: Diagnosis not present

## 2016-01-07 DIAGNOSIS — H18413 Arcus senilis, bilateral: Secondary | ICD-10-CM | POA: Diagnosis not present

## 2016-01-07 DIAGNOSIS — H11153 Pinguecula, bilateral: Secondary | ICD-10-CM | POA: Diagnosis not present

## 2016-01-08 DIAGNOSIS — M722 Plantar fascial fibromatosis: Secondary | ICD-10-CM | POA: Diagnosis not present

## 2016-01-09 ENCOUNTER — Other Ambulatory Visit: Payer: Self-pay | Admitting: Orthopedic Surgery

## 2016-01-09 DIAGNOSIS — M25561 Pain in right knee: Secondary | ICD-10-CM

## 2016-01-11 LAB — HM DIABETES EYE EXAM

## 2016-01-12 ENCOUNTER — Encounter: Payer: Self-pay | Admitting: Family Medicine

## 2016-01-17 ENCOUNTER — Ambulatory Visit
Admission: RE | Admit: 2016-01-17 | Discharge: 2016-01-17 | Disposition: A | Discharge status: Home / Self Care | Payer: Medicare Other | Source: Ambulatory Visit | Attending: Orthopedic Surgery | Admitting: Orthopedic Surgery

## 2016-01-17 DIAGNOSIS — M25561 Pain in right knee: Secondary | ICD-10-CM

## 2016-01-20 DIAGNOSIS — G8929 Other chronic pain: Secondary | ICD-10-CM | POA: Diagnosis not present

## 2016-01-20 DIAGNOSIS — M1711 Unilateral primary osteoarthritis, right knee: Secondary | ICD-10-CM | POA: Diagnosis not present

## 2016-01-20 DIAGNOSIS — S83241A Other tear of medial meniscus, current injury, right knee, initial encounter: Secondary | ICD-10-CM | POA: Diagnosis not present

## 2016-01-28 DIAGNOSIS — L570 Actinic keratosis: Secondary | ICD-10-CM | POA: Diagnosis not present

## 2016-01-29 ENCOUNTER — Ambulatory Visit (INDEPENDENT_AMBULATORY_CARE_PROVIDER_SITE_OTHER): Payer: Medicare Other | Admitting: Rheumatology

## 2016-01-29 ENCOUNTER — Ambulatory Visit: Payer: Self-pay | Admitting: Rheumatology

## 2016-01-29 ENCOUNTER — Encounter: Payer: Self-pay | Admitting: Rheumatology

## 2016-01-29 VITALS — BP 141/70 | HR 53 | Resp 12

## 2016-01-29 DIAGNOSIS — M47816 Spondylosis without myelopathy or radiculopathy, lumbar region: Secondary | ICD-10-CM

## 2016-01-29 DIAGNOSIS — M816 Localized osteoporosis [Lequesne]: Secondary | ICD-10-CM | POA: Diagnosis not present

## 2016-01-29 DIAGNOSIS — M19041 Primary osteoarthritis, right hand: Secondary | ICD-10-CM

## 2016-01-29 DIAGNOSIS — M25561 Pain in right knee: Secondary | ICD-10-CM

## 2016-01-29 DIAGNOSIS — M62830 Muscle spasm of back: Secondary | ICD-10-CM | POA: Diagnosis not present

## 2016-01-29 DIAGNOSIS — M19042 Primary osteoarthritis, left hand: Secondary | ICD-10-CM

## 2016-01-29 DIAGNOSIS — M170 Bilateral primary osteoarthritis of knee: Secondary | ICD-10-CM

## 2016-01-29 NOTE — Progress Notes (Signed)
Office Visit Note  Patient: Kyle Simon             Date of Birth: 1932-05-01           MRN: [id number]             PCP: Mellody Dance, DO Referring: Mellody Dance, DO Visit Date: 01/29/2016 Occupation: @GUAROCC @    Subjective:  Follow-up Follow-up osteoporosis and osteoarthritis  History of Present Illness: TAYLIN BOSSEN is a 82 y.o. male  Last seen 07/31/2015. History of osteoporosis with a bone density done in March 2017 with a T score of -2.5 of right one third radius.   About 3 weeks ago, patient started having some right knee joint pain and it gave away. He saw [name], orthopedist, who did x-rays of his knees for the right knee joint pain and was negative. He gave him a cortisone injection which gave very little relief. Week later an MRI was done on 01/17/2016. Please see MRI report for full details below in the imaging section of this note. In summary patient has a complex tear of the meniscus in his right knee joint.  Note that he has a total knee replacement of the left knee.  Also note that recently he had shoulder surgery done. He is doing well with that.  Activities of Daily Living:  Patient reports morning stiffness for 15 minutes.   Patient Denies nocturnal pain.  Difficulty dressing/grooming: Denies Difficulty climbing stairs: Denies Difficulty getting out of chair: Denies Difficulty using hands for taps, buttons, cutlery, and/or writing: Denies   Review of Systems  Constitutional: Negative for fatigue.  HENT: Negative for mouth sores and mouth dryness.   Eyes: Negative for dryness.  Respiratory: Negative for shortness of breath.   Gastrointestinal: Negative for constipation and diarrhea.  Musculoskeletal: Negative for myalgias and myalgias.  Skin: Negative for sensitivity to sunlight.  Neurological: Negative for memory loss.  Psychiatric/Behavioral: Negative for sleep disturbance.    PMFS History:  Patient Active Problem List   Diagnosis Date Noted  . Cough 01/04/2016  . High risk medications (not anticoagulants) long-term use 01/04/2016  . Hypomagnesemia 12/29/2015  . h/o Hyponatremia 12/29/2015  . Cerumen impaction 08/25/2015  . Diabetes mellitus without complication ([place]) 123456  . Flatulence symptom 07/25/2015  . Diarrhea 06/21/2015  . Chronic rhinitis 11/01/2014  . Essential hypertension 03/20/2014  . Spondylolisthesis of lumbar region 10/23/2013  . Diverticulosis of colon without hemorrhage 01/22/2013  . B12 deficiency anemia 01/22/2013  . Anemia, unspecified 08/13/2012  . Synovial cyst of lumbar spine 07/26/2011  . Spasms of the hands or feet 07/26/2011  . GERD (gastroesophageal reflux disease) 03/26/2011  . SENILE KERATOSIS 06/12/2008  . Hyperlipidemia 04/19/2007  . SNORING, HX OF 04/19/2007  . BPH (benign prostatic hyperplasia) 04/18/2007  . Hypothyroidism 04/20/2006    Past Medical History:  Diagnosis Date  . Arthritis    BOTH KNEES and hands;Dr.Deveshwar  . Constipation due to pain medication   . Enlarged prostate   . Frequent urination at night   . GERD (gastroesophageal reflux disease)   . Hyperlipidemia   . Hypothyroidism   . Polio    as a child w/o complications  . Snoring    no sleep apnea  . Transfusion history 2012   post TKR    Family History  Problem Relation Age of Onset  . Sudden death Father     MVA  . Coronary artery disease Mother   . Emphysema Mother   .  Diverticulosis Mother   . Asthma Brother   . Bone cancer Brother   . Diabetes Paternal Grandmother   . Sarcoidosis Son   . Anesthesia problems Neg Hx   . Hypotension Neg Hx   . Malignant hyperthermia Neg Hx   . Pseudochol deficiency Neg Hx   . Colon cancer Neg Hx    Past Surgical History:  Procedure Laterality Date  . BACK SURGERY  2011   hemiarthroplasty 01/11/1999 and 6  . CARDIAC CATHETERIZATION  09/2001   negative  . EYE SURGERY  2003   R&L cataract removed & IOL- 2003 & 2007,Dr.Jenkins  .  INGUINAL HERNIA REPAIR  2002   right, Dr. Truitt Leep  . JOINT REPLACEMENT  2012   left knee replacement  . LIPOMA EXCISION     back  . MENISCECTOMY Bilateral    Dr. Noemi Chapel  . PROSTATE SURGERY  07/2006   for urinary urgency, Dr. Risa Grill  . ROTATOR CUFF REPAIR Right October, 2016  . sinus & throat surgery-1995  1995  . TONSILLECTOMY    . TOTAL KNEE ARTHROPLASTY  01/11/2011   Procedure: TOTAL KNEE ARTHROPLASTY;  Surgeon: Rudean Haskell, MD;  Location: [facility];  Service: Orthopedics;  Laterality: Left;  . UVULECTOMY  [id number]   Dr. Erik Obey  . VASECTOMY    . WRIST SURGERY  1959   fracture- right   Social History   Social History Narrative  . No narrative on file     Objective: Vital Signs: BP (!) 141/70 (BP Location: Left Arm, Patient Position: Sitting, Cuff Size: Large)   Pulse (!) 53   Resp 12    Physical Exam  Constitutional: He is oriented to person, place, and time. He appears well-developed and well-nourished.  HENT:  Head: Normocephalic and atraumatic.  Eyes: Conjunctivae and EOM are normal. Pupils are equal, round, and reactive to light.  Neck: Normal range of motion. Neck supple.  Cardiovascular: Normal rate, regular rhythm and normal heart sounds.  Exam reveals no gallop and no friction rub.   No murmur heard. Pulmonary/Chest: Effort normal and breath sounds normal. No respiratory distress. He has no wheezes. He has no rales. He exhibits no tenderness.  Abdominal: Soft. He exhibits no distension and no mass. There is no tenderness. There is no guarding.  Musculoskeletal: Normal range of motion.  Lymphadenopathy:    He has no cervical adenopathy.  Neurological: He is alert and oriented to person, place, and time. He exhibits normal muscle tone. Coordination normal.  Skin: Skin is warm and dry. Capillary refill takes less than 2 seconds. No rash noted.  Psychiatric: He has a normal mood and affect. His behavior is normal. Judgment and thought content normal.  Vitals  reviewed.    Musculoskeletal Exam:  Full range of motion of all joints Grip strength is equal and strong bilaterally Fiber myalgia tender points are all absent  CDAI Exam: No CDAI exam completed.    Investigation: No additional findings.   Imaging: Mr Knee Right Wo Contrast  Result Date: 01/17/2016 CLINICAL DATA:  Right knee pain for 3 weeks. History of right knee surgery and 2000. EXAM: MRI OF THE RIGHT KNEE WITHOUT CONTRAST TECHNIQUE: Multiplanar, multisequence MR imaging of the knee was performed. No intravenous contrast was administered. COMPARISON:  MRI right knee 05/30/2012. FINDINGS: MENISCI Medial meniscus: Complex tear in the posterior horn has progressed since the prior study with increased blunting along the free edge. The tear extends into the posterior meniscal body in a horizontal orientation reaching  the meniscal undersurface. No displaced fragment. Lateral meniscus:  Intact. LIGAMENTS Cruciates: There is some mucoid degeneration of both ligaments without tear. Collaterals:  Intact. CARTILAGE Patellofemoral: Susceptibility artifact limits visualization somewhat but there is extensive hyaline cartilage loss throughout which does not appear notably changed. Medial:  Somewhat thinned and irregular, unchanged. Lateral:  Minimally degenerated, unchanged. Joint:  Small joint effusion. Popliteal Fossa: Baker's cyst measures up to 3.5 cm transverse by 1.9 cm AP by 9.2 cm craniocaudal is markedly increased in size since the prior exam. Main component of the cyst is at and superior to the joint line. Extensor Mechanism:  Intact. Bones:  No fracture or worrisome lesion. Other: There has been marked progression of degenerative change about the proximal tib-fib joint with extensive subchondral cyst formation now seen. Synovial cyst extending posteriorly and superiorly out of the joint measures 1.4 cm craniocaudal by 0.8 cm AP by 1.3 cm transverse. IMPRESSION: Progressive complex tearing  posterior horn medial meniscus extends into the meniscal body in a horizontal orientation reaching the meniscal undersurface. Mucoid degeneration the ACL and PCL without tear. Large Baker's cyst has markedly increased in size since the prior exam. Marked progression of advanced degenerative change about the proximal tib-fib joint. Electronically Signed   By: Inge Rise M.D.   On: 01/17/2016 09:01    Speciality Comments: No specialty comments available.    Procedures:  No procedures performed Allergies: Simvastatin and Oxycodone   Assessment / Plan:     Visit Diagnoses: Localized osteoporosis without current pathological fracture  Primary osteoarthritis of both knees  Primary osteoarthritis of both hands  Spondylosis of lumbar region without myelopathy or radiculopathy   Recent MRI of right knee without contrast shows complex tearing of the posterior horn of the medial meniscus extending to the meniscal body. Large Baker's cyst. Marked progression of advanced degenerative change about the proximal tib-fib joint. This is per Dr. Pollyann Kennedy reading on 01/17/2016.  Note: I've given the patient a physical therapy prescription to go to a place of his choice for lumbar muscle spasms especially to the left lateral back.  He has an appointment for surgery 03/13/2016 with Dr. Lorre Nick for repair of his right meniscal tear. He will also be doing physical therapy after the surgery.  He is given the use of combination of Voltaren gel or Lidoderm patch or Tylenol or combination that he and I discussed for his left lateral muscle spasm of his back. We are unable to give him a muscle relaxer at this time due to his age and the unwanted effects that that can have on the patient.   Orders: No orders of the defined types were placed in this encounter.  No orders of the defined types were placed in this encounter.   Face-to-face time spent with patient was 30 minutes. 50% of time was spent in  counseling and coordination of care.  Follow-Up Instructions: Return in about 6 months (around 07/28/2016) for oporosis, oakj, oahands, ddd l-spine.   Eliezer Lofts, PA-C   I examined and evaluated the patient with Eliezer Lofts PA. The plan of care was discussed as noted above.  Bo Merino, MD

## 2016-02-04 ENCOUNTER — Other Ambulatory Visit (INDEPENDENT_AMBULATORY_CARE_PROVIDER_SITE_OTHER): Payer: Medicare Other

## 2016-02-04 DIAGNOSIS — E871 Hypo-osmolality and hyponatremia: Secondary | ICD-10-CM

## 2016-02-04 DIAGNOSIS — E119 Type 2 diabetes mellitus without complications: Secondary | ICD-10-CM

## 2016-02-04 DIAGNOSIS — Z79899 Other long term (current) drug therapy: Secondary | ICD-10-CM

## 2016-02-04 DIAGNOSIS — E039 Hypothyroidism, unspecified: Secondary | ICD-10-CM | POA: Diagnosis not present

## 2016-02-04 DIAGNOSIS — I10 Essential (primary) hypertension: Secondary | ICD-10-CM | POA: Diagnosis not present

## 2016-02-04 LAB — POCT GLYCOSYLATED HEMOGLOBIN (HGB A1C): Hemoglobin A1C: 6.2

## 2016-02-05 LAB — MAGNESIUM: Magnesium: 2 mg/dL (ref 1.5–2.5)

## 2016-02-05 LAB — POTASSIUM: Potassium: 4.6 mmol/L (ref 3.5–5.3)

## 2016-02-05 LAB — TSH: TSH: 2.19 mIU/L (ref 0.40–4.50)

## 2016-02-05 LAB — T4, FREE: Free T4: 1.6 ng/dL (ref 0.8–1.8)

## 2016-02-05 LAB — PHOSPHORUS: Phosphorus: 2.9 mg/dL (ref 2.1–4.3)

## 2016-02-05 LAB — SODIUM: Sodium: 133 mmol/L — ABNORMAL LOW (ref 135–146)

## 2016-02-11 DIAGNOSIS — M1711 Unilateral primary osteoarthritis, right knee: Secondary | ICD-10-CM | POA: Diagnosis not present

## 2016-02-11 DIAGNOSIS — S83231A Complex tear of medial meniscus, current injury, right knee, initial encounter: Secondary | ICD-10-CM | POA: Diagnosis not present

## 2016-02-11 DIAGNOSIS — G8918 Other acute postprocedural pain: Secondary | ICD-10-CM | POA: Diagnosis not present

## 2016-02-11 DIAGNOSIS — M65861 Other synovitis and tenosynovitis, right lower leg: Secondary | ICD-10-CM | POA: Diagnosis not present

## 2016-02-11 DIAGNOSIS — S83281A Other tear of lateral meniscus, current injury, right knee, initial encounter: Secondary | ICD-10-CM | POA: Diagnosis not present

## 2016-02-11 DIAGNOSIS — M94261 Chondromalacia, right knee: Secondary | ICD-10-CM | POA: Diagnosis not present

## 2016-02-11 DIAGNOSIS — M6751 Plica syndrome, right knee: Secondary | ICD-10-CM | POA: Diagnosis not present

## 2016-02-11 DIAGNOSIS — S83271A Complex tear of lateral meniscus, current injury, right knee, initial encounter: Secondary | ICD-10-CM | POA: Diagnosis not present

## 2016-02-11 DIAGNOSIS — Y999 Unspecified external cause status: Secondary | ICD-10-CM | POA: Diagnosis not present

## 2016-02-17 DIAGNOSIS — M25561 Pain in right knee: Secondary | ICD-10-CM | POA: Diagnosis not present

## 2016-03-04 DIAGNOSIS — M25561 Pain in right knee: Secondary | ICD-10-CM | POA: Diagnosis not present

## 2016-03-19 DIAGNOSIS — I739 Peripheral vascular disease, unspecified: Secondary | ICD-10-CM | POA: Diagnosis not present

## 2016-03-19 DIAGNOSIS — L603 Nail dystrophy: Secondary | ICD-10-CM | POA: Diagnosis not present

## 2016-04-14 ENCOUNTER — Encounter: Payer: Self-pay | Admitting: Adult Health

## 2016-04-14 ENCOUNTER — Ambulatory Visit (INDEPENDENT_AMBULATORY_CARE_PROVIDER_SITE_OTHER): Payer: Medicare Other | Admitting: Adult Health

## 2016-04-14 VITALS — BP 116/71 | HR 60 | Ht 70.5 in | Wt 205.4 lb

## 2016-04-14 DIAGNOSIS — R143 Flatulence: Secondary | ICD-10-CM | POA: Diagnosis not present

## 2016-04-14 NOTE — Patient Instructions (Addendum)
Healthy Eating to Prevent Digestive Disorders The digestive system starts at the mouth and goes all the way down to the rectum. Along the way, your digestive system breaks down the food you eat so you can absorb its nutrients and use them for energy. Digestive disorders can cause gas, bloating, pain, heartburn, and other symptoms. They can prevent your digestive system from doing its job. Healthy eating and a healthy lifestyle can help you avoid many common digestive disorders. What nutrition changes can be made? Start by eating a balanced diet. Eat healthy foods from all the major food groups. These include carbohydrates, fats, and proteins. Other changes you can make include to:  Eat enough fiber. Fiber is a healthy carbohydrate that cleans out your digestive system. Fiber absorbs water and helps you have regular bowel movements. Fiber comes from plants. To get enough fiber in your diet, eat 4-5 servings of fruits, vegetables, and legumes every day. Include beans and whole grains. Most people should get 20?35 grams of fiber each day.  Drink enough water to keep your urine clear or pale yellow. Water helps your body digest food. It can also help prevent constipation.  Avoid fatty proteins. Full-fat dairy products and fatty meats are hard to digest. Fats you want to avoid are those that get solid at room temperature (saturated fats). Instead of eating these kinds of fats, eat plant-based unsaturated fats found in olives, canola, corn, avocado, and nuts.  If you have trouble with gas, belching, or flatulence, avoid gas-producing foods. These include beans, carbonated beverages, cabbage, cauliflower, and broccoli. If you are lactose intolerant, avoid dairy products or choose lactose-free dairy products.  If you have frequent heartburn, stay away from alcohol, caffeine, fatty foods, chocolate, and peppermint. Avoid lying down within two hours of eating a full meal. Overeating and lying down too soon after  a meal can cause heartburn.  Add probiotics to your diet. Healthy digestion depends on having the right balance of good bacteria in your colon. Probiotics can help restore the balance of good bacteria in your digestive system. Probiotics are live active cultures that are found in yogurt, kefir, and cultured foods like sauerkraut and miso. You can also add good bacteria with probiotic supplements.  Make sure to chew your food slowly and completely.  Instead of eating three large meals each day, eat three small meals with three small snacks. What other changes can I make? You can help your digestive system stay healthy by making these lifestyle changes:  Stay active and exercise every day.  Maintain a healthy weight.  Eat on a regular schedule.  Avoid tight-fitting clothes. They can restrict digestion.  If you have frequent heartburn, raise the head of your bed 2-3 inches (5-7.5 cm).  Do not use any tobacco products, such as cigarettes, chewing tobacco, and e-cigarettes. If you need help quitting, ask your health care provider.  Limit alcohol intake to no more than 1 drink a day for nonpregnant women and 2 drinks a day for men. One drink equals 12 oz of beer, 5 oz of wine, or 1 oz of hard liquor.  Avoid stress. Find ways to reduce stress, such as meditation, exercise, or taking time for activities that relax you. Why should I make these changes? Making these changes will help your digestive system function at its best. A healthy digestive system can help you avoid or improve your management of digestive disorders such as:  Bloating, gas, and flatulence.  Heartburn.  Gastroesophageal reflux disease (GERD).  Peptic ulcer disease.  Hemorrhoids.  Diverticulitis.  Constipation.  Diarrhea.  Gall stones  Irritable bowel syndrome.  Malnutrition.  Fatty liver disease. What can happen if changes are not made? Not making these changes could put you at risk for many conditions  caused by a poor diet or an unhealthy weight, such as heart disease, stroke and diabetes. Where can I get more information? Learn more about healthy eating and digestive disorders by visiting these websites:  Academy of Nutrition and Dietetics: DenimDistribution.com.ee  Centers for Disease Control and Prevention: CoinSpecialists.co.za  U.S. Department of Health and Human Services: [place].com.cy.pdf Summary  A heathy diet can help prevent many digestive disorders.  Eat a balanced diet consisting of fiber, unsaturated fats, lean protein, fruits, and vegetables.  Eat three small meals with three small snacks per day.  Drink plenty of water every day.  Get plenty of exercise and maintain a healthy weight. This information is not intended to replace advice given to you by your health care provider. Make sure you discuss any questions you have with your health care provider. Document Released: 03/14/2015 Document Revised: 07/24/2015 Document Reviewed: 10/29/2015 Elsevier Interactive Patient Education  2017 [place] off Cyanocobalamin. May continue Famotidine 20mg  daily, up to 12 weeks. GI referral placed. Please return in 3 months for regular follow-up.

## 2016-04-14 NOTE — Assessment & Plan Note (Addendum)
New GI referral placed, ensure that this provider will accept Medicare coverage. Continue Probiotic. Remain off Vit B supplment. Famotidine 20mg  daily for up to 12 weeks. Continue healthy eating.

## 2016-04-14 NOTE — Progress Notes (Signed)
Subjective:    Patient ID: Kyle Simon, male    DOB: [birth year], 83 y.o.   MRN: [id number]  HPI:  Mr. Elbers is here for continued flatulence that started 07/2015.  Dr. Raliegh Scarlet placed GI referral-Dr. Collene Mares.  When he went to the appt. He was informed that she does not accept Medicare pt's and he was turned away.   He has been taking Probiotic regularly and modified his diet a few times (i.e. Stopped eating geasy foods, stopped ETOH use-neither of which reduced flatulence).  He used OTC Famotidine BID for 14 days and states that the flatulence reduced and stopped "being wet". Last dose of Famotidine was a few days ago.  He also stopped taking Vit B supplement and the flatulence also improved. He denies CP/dyspnea/N/V/D/constipation.  He does state that his number of BMs has increased to 3 a day and they are loose, however not diarrhea.    Patient Care Team    Relationship Specialty Notifications Start End  Mellody Dance, DO PCP - General Family Medicine  08/05/15   Rana Snare, MD Consulting Physician Urology  11/10/15   Lavonna Monarch, MD Consulting Physician Dermatology  [id number]   Jodi Marble, MD Consulting Physician Otolaryngology  11/10/15   Calton Dach, MD Referring Physician Optometry  11/10/15   Inocencio Homes, DPM Consulting Physician Podiatry  11/10/15   Bo Merino, MD Consulting Physician Rheumatology  11/10/15   Vickey Huger, MD Consulting Physician Orthopedic Surgery  11/10/15   Erline Levine, MD Consulting Physician Neurosurgery  11/10/15   Deboraha Sprang, MD Consulting Physician Cardiology  01/04/16    Comment: seen '12 last    Patient Active Problem List   Diagnosis Date Noted  . Cough 01/04/2016  . High risk medications (not anticoagulants) long-term use 01/04/2016  . Hypomagnesemia 12/29/2015  . h/o Hyponatremia 12/29/2015  . Cerumen impaction 08/25/2015  . Diabetes mellitus without complication ([place]) 123456  . Flatulence symptom 07/25/2015  . Diarrhea  06/21/2015  . Chronic rhinitis 11/01/2014  . Essential hypertension 03/20/2014  . Spondylolisthesis of lumbar region 10/23/2013  . Diverticulosis of colon without hemorrhage 01/22/2013  . B12 deficiency anemia 01/22/2013  . Anemia, unspecified 08/13/2012  . Synovial cyst of lumbar spine 07/26/2011  . Spasms of the hands or feet 07/26/2011  . GERD (gastroesophageal reflux disease) 03/26/2011  . SENILE KERATOSIS 06/12/2008  . Hyperlipidemia 04/19/2007  . SNORING, HX OF 04/19/2007  . BPH (benign prostatic hyperplasia) 04/18/2007  . Hypothyroidism 04/20/2006     Past Medical History:  Diagnosis Date  . Arthritis    BOTH KNEES and hands;Dr.Deveshwar  . Constipation due to pain medication   . Enlarged prostate   . Frequent urination at night   . GERD (gastroesophageal reflux disease)   . Hyperlipidemia   . Hypothyroidism   . Polio    as a child w/o complications  . Snoring    no sleep apnea  . Torn meniscus   . Transfusion history 2012   post TKR     Past Surgical History:  Procedure Laterality Date  . BACK SURGERY  2011   hemiarthroplasty 01/11/1999 and 6  . CARDIAC CATHETERIZATION  09/2001   negative  . EYE SURGERY  2003   R&L cataract removed & IOL- 2003 & 2007,Dr.Jenkins  . INGUINAL HERNIA REPAIR  2002   right, Dr. Truitt Leep  . JOINT REPLACEMENT  2012   left knee replacement  . LIPOMA EXCISION     back  . MENISCECTOMY Bilateral  Dr. Noemi Chapel  . PROSTATE SURGERY  07/2006   for urinary urgency, Dr. Risa Grill  . ROTATOR CUFF REPAIR Right October, 2016  . sinus & throat surgery-1995  1995  . TONSILLECTOMY    . TOTAL KNEE ARTHROPLASTY  01/11/2011   Procedure: TOTAL KNEE ARTHROPLASTY;  Surgeon: Rudean Haskell, MD;  Location: [facility];  Service: Orthopedics;  Laterality: Left;  . UVULECTOMY  [id number]   Dr. Erik Obey  . VASECTOMY    . WRIST SURGERY  1959   fracture- right     Family History  Problem Relation Age of Onset  . Sudden death Father     MVA  . Coronary  artery disease Mother   . Emphysema Mother   . Diverticulosis Mother   . Asthma Brother   . Bone cancer Brother   . Diabetes Paternal Grandmother   . Sarcoidosis Son   . Anesthesia problems Neg Hx   . Hypotension Neg Hx   . Malignant hyperthermia Neg Hx   . Pseudochol deficiency Neg Hx   . Colon cancer Neg Hx      History  Drug Use No     History  Alcohol Use  . 2.4 oz/week  . 4 Shots of liquor per week    Comment: social     History  Smoking Status  . Former Smoker  . Packs/day: 1.00  . Years: 18.00  . Quit date: 03/01/1966  Smokeless Tobacco  . Never Used     Outpatient Encounter Prescriptions as of 04/14/2016  Medication Sig Note  . acetaminophen (TYLENOL) 500 MG tablet Take 500 mg by mouth every 8 (eight) hours as needed for mild pain.    Marland Kitchen aspirin EC 81 MG tablet Take 81 mg by mouth daily.   Marland Kitchen CALCIUM-MAGNESIUM-ZINC PO Take 1 tablet by mouth daily.   . celecoxib (CELEBREX) 200 MG capsule Take 200 mg by mouth every other day.    Marland Kitchen glucose blood (PRECISION XTRA TEST STRIPS) test strip USE TO CHECK BLOOD SUGAR ONCE DAILY DX E11.9   . Hypromellose (ARTIFICIAL TEARS OP) Place 1 drop into both eyes 2 (two) times daily.   Marland Kitchen levothyroxine (SYNTHROID, LEVOTHROID) 150 MCG tablet  01/29/2016: Received from: External Pharmacy  . LEVOXYL 125 MCG tablet  01/29/2016: Received from: External Pharmacy  . losartan (COZAAR) 50 MG tablet TAKE ONE-HALF (1/2) TABLET EVERY OTHER DAY   . Multiple Vitamin (MULTIVITAMIN WITH MINERALS) TABS Take 1 tablet by mouth daily.   . NONFORMULARY OR COMPOUNDED ITEM Glucometer Museum/gallery curator XTRA- Model #[id number])   . Omega-3 Fatty Acids (FISH OIL PO) Take 1 capsule by mouth daily.   . Probiotic Product (RESTORA) CAPS One tab qd   . Tamsulosin HCl (FLOMAX) 0.4 MG CAPS Take 0.4 mg by mouth 2 (two) times daily.    . [DISCONTINUED] Cyanocobalamin (VITAMIN B-12 PO) Take 1 tablet by mouth daily.   . [DISCONTINUED] fluocinonide (LIDEX) 0.05 % external  solution 1 application. 1 drop to left ear as needed 11/10/2015: Received from: External Pharmacy   No facility-administered encounter medications on file as of 04/14/2016.     Allergies: Simvastatin and Oxycodone  Body mass index is 29.06 kg/m.  Blood pressure 116/71, pulse 60, height 5' 10.5" (1.791 m), weight 205 lb 6.4 oz (93.2 kg).     Review of Systems  Constitutional: Negative for activity change, appetite change, chills, diaphoresis, fatigue, fever and unexpected weight change.  HENT: Negative for congestion.   Eyes: Negative for visual disturbance.  Respiratory: Negative  for cough and shortness of breath.   Cardiovascular: Negative for chest pain, palpitations and leg swelling.  Gastrointestinal: Negative for abdominal distention, abdominal pain, anal bleeding, blood in stool, constipation, diarrhea, nausea and vomiting.  Endocrine: Negative for cold intolerance, heat intolerance, polydipsia, polyphagia and polyuria.  Genitourinary: Negative for difficulty urinating and flank pain.  Skin: Negative for color change, pallor, rash and wound.  Neurological: Negative for dizziness.       Objective:   Physical Exam  Constitutional: He is oriented to person, place, and time. He appears well-developed and well-nourished. No distress.  HENT:  Head: Normocephalic and atraumatic.  Eyes: Conjunctivae and EOM are normal. Pupils are equal, round, and reactive to light.  Cardiovascular: Normal rate, regular rhythm, normal heart sounds and intact distal pulses.   No murmur heard. Pulmonary/Chest: Breath sounds normal.  Abdominal: Soft. Bowel sounds are normal. He exhibits no distension and no mass. There is no tenderness. There is no rebound and no guarding.  Neurological: He is alert and oriented to person, place, and time. He has normal reflexes.  Skin: Skin is warm and dry. No rash noted. He is not diaphoretic. No erythema. No pallor.  Psychiatric: He has a normal mood and affect.  His behavior is normal. Judgment and thought content normal.          Assessment & Plan:   1. Flatulence symptom   2. Flatulence     Flatulence symptom New GI referral placed, ensure that this provider will accept Medicare coverage. Continue Probiotic. Remain off Vit B supplment. Famotidine 20mg  daily for up to 12 weeks. Continue healthy eating.    FOLLOW-UP:  Return in about 3 months (around 07/12/2016) for Regular Follow Up, Fasting Lab Draw.

## 2016-04-15 ENCOUNTER — Telehealth: Payer: Self-pay | Admitting: Gastroenterology

## 2016-04-15 ENCOUNTER — Encounter: Payer: Self-pay | Admitting: Family Medicine

## 2016-04-16 NOTE — Telephone Encounter (Signed)
Dr.Jacobs reviewed records and declined to accept patient. Left message for patient to notify him of this.

## 2016-04-22 DIAGNOSIS — G8929 Other chronic pain: Secondary | ICD-10-CM | POA: Diagnosis not present

## 2016-04-22 DIAGNOSIS — Z9889 Other specified postprocedural states: Secondary | ICD-10-CM | POA: Diagnosis not present

## 2016-04-22 DIAGNOSIS — M25561 Pain in right knee: Secondary | ICD-10-CM | POA: Diagnosis not present

## 2016-05-28 DIAGNOSIS — M1711 Unilateral primary osteoarthritis, right knee: Secondary | ICD-10-CM | POA: Diagnosis not present

## 2016-05-28 DIAGNOSIS — G8929 Other chronic pain: Secondary | ICD-10-CM | POA: Diagnosis not present

## 2016-06-10 DIAGNOSIS — L603 Nail dystrophy: Secondary | ICD-10-CM | POA: Diagnosis not present

## 2016-06-10 DIAGNOSIS — I739 Peripheral vascular disease, unspecified: Secondary | ICD-10-CM | POA: Diagnosis not present

## 2016-06-10 DIAGNOSIS — L84 Corns and callosities: Secondary | ICD-10-CM | POA: Diagnosis not present

## 2016-06-10 LAB — HM DIABETES FOOT EXAM

## 2016-06-22 ENCOUNTER — Encounter: Payer: Self-pay | Admitting: Family Medicine

## 2016-06-22 ENCOUNTER — Ambulatory Visit (INDEPENDENT_AMBULATORY_CARE_PROVIDER_SITE_OTHER): Payer: Medicare Other | Admitting: Family Medicine

## 2016-06-22 VITALS — BP 136/70 | HR 56 | Ht 70.5 in | Wt 197.0 lb

## 2016-06-22 DIAGNOSIS — I10 Essential (primary) hypertension: Secondary | ICD-10-CM | POA: Diagnosis not present

## 2016-06-22 DIAGNOSIS — E039 Hypothyroidism, unspecified: Secondary | ICD-10-CM

## 2016-06-22 DIAGNOSIS — R143 Flatulence: Secondary | ICD-10-CM | POA: Diagnosis not present

## 2016-06-22 DIAGNOSIS — E785 Hyperlipidemia, unspecified: Secondary | ICD-10-CM

## 2016-06-22 DIAGNOSIS — E119 Type 2 diabetes mellitus without complications: Secondary | ICD-10-CM

## 2016-06-22 LAB — POCT GLYCOSYLATED HEMOGLOBIN (HGB A1C): Hemoglobin A1C: 6.4

## 2016-06-22 NOTE — Assessment & Plan Note (Addendum)
Well controlled at 6.4 today - cont prudent diet - start exercising which will likely help with GI sx as well

## 2016-06-22 NOTE — Patient Instructions (Signed)
Celiac Disease Antibodies Test Why am I having this test? The celiac disease antibodies test is a blood test used to help determine if you have celiac disease. Antibodies are proteins that your body makes to protect your body from germs and other things that can make you sick. With celiac disease, the body produces antibodies in response to the proteins gluten and gliadin. Instead of protecting your body, these antibodies attack the lining of your intestine. The celiac disease antibodies test looks for antibodies common to celiac disease. You may have a celiac disease antibodies test if you have symptoms of celiac disease. These include:  Long-lasting (chronic) diarrhea.  Belly (abdominal) pain.  Weight loss. You may also have this test if you are related to someone who has celiac disease. What kind of sample is taken? A blood sample is required for this test. It is usually collected by inserting a needle into a vein. How do I prepare for this test? You may be asked to provide a list of foods that you have eaten in the 48 hours before the test. If you have eaten foods that contain gluten, the test will show a strong antibody response if you do have celiac disease. Check with your health care provider for specific instructions. What are the reference ranges? Reference ranges are considered healthy ranges established after testing a large group of healthy people. Reference ranges may vary among different people, labs, and hospitals. It is your responsibility to obtain your test results. Ask the lab or department performing the test when and how you will get your results. Three antibodies are common to celiac disease:  Gliadin IgA and IgG.  Endomysial IgA.  Tissue transglutaminase IgA. The reference ranges for these three antibodies are as follows:  Gliadin IgA and IgG:  Birth to 81 years of age, less than 60 EU.  63 years of age and older, less than 25 EU.  Endomysial IgA: All ages,  negative.  Tissue transglutaminase IgA: All ages, less than 20 EU. What do the results mean? High levels of antibodies or a positive result may mean that you have celiac disease. Talk with your health care provider to discuss your results, treatment options, and if necessary, the need for more tests. Talk with your health care provider if you have any questions about your results. Talk with your health care provider to discuss your results, treatment options, and if necessary, the need for more tests. Talk with your health care provider if you have any questions about your results. This information is not intended to replace advice given to you by your health care provider. Make sure you discuss any questions you have with your health care provider. Document Released: 03/11/2004 Document Revised: 10/21/2015 Document Reviewed: 06/13/2013 Elsevier Interactive Patient Education  2017 [place].    Gluten-Free Diet for Celiac Disease, Adult The gluten-free diet includes all foods that do not contain gluten. Gluten is a protein that is found in wheat, rye, barley, and some other grains. Following the gluten-free diet is the only treatment for people with celiac disease. It helps to prevent damage to the intestines and improves or eliminates the symptoms of celiac disease. Following the gluten-free diet requires some planning. It can be challenging at first, but it gets easier with time and practice. There are more gluten-free options available today than ever before. If you need help finding gluten-free foods or if you have questions, talk with your diet and nutrition specialist (registered dietitian) or your  health care provider. What do I need to know about a gluten-free diet?  All fruits, vegetables, and meats are safe to eat and do not contain gluten.  When grocery shopping, start by shopping in the produce, meat, and dairy sections. These sections are more likely to contain gluten-free foods.  Then move to the aisles that contain packaged foods if you need to.  Read all food labels. Gluten is often added to foods. Always check the ingredient list and look for warnings, such as "may contain gluten."  Talk with your dietitian or health care provider before taking a gluten-free multivitamin or mineral supplement.  Be aware of gluten-free foods having contact with foods that contain gluten (cross-contamination). This can happen at home and with any processed foods.  Talk with your health care provider or dietitian about how to reduce the risk of cross-contamination in your home.  If you have questions about how a food is processed, ask the manufacturer. What key words help to identify gluten? Foods that list any of these key words on the label usually contain gluten:  Wheat, flour, enriched flour, bromated flour, white flour, durum flour, graham flour, phosphated flour, self-rising flour, semolina, farina, barley (malt), rye, and oats.  Starch, dextrin, modified food starch, or cereal.  Thickening, fillers, or emulsifiers.  Malt flavoring, malt extract, or malt syrup.  Hydrolyzed vegetable protein. In the [place] packaged foods that are gluten-free are required to be labeled "GF." These foods should be easy to identify and are safe to eat. In the [place] food companies are also required to list common food allergens, including wheat, on their labels. Recommended foods Grains   Amaranth, bean flours, 100% buckwheat flour, corn, millet, nut flours or nut meals, GF oats, quinoa, rice, sorghum, teff, rice wafers, pure cornmeal tortillas, popcorn, and hot cereals made from cornmeal. Hominy, rice, wild rice. Some Asian rice noodles or bean noodles. Arrowroot starch, corn bran, corn flour, corn germ, cornmeal, corn starch, potato flour, potato starch flour, and rice bran. Plain, brown, and sweet rice flours. Rice polish, soy flour, and tapioca starch. Vegetables   All plain fresh, frozen,  and canned vegetables. Fruits   All plain fresh, frozen, canned, and dried fruits, and 100% fruit juices. Meats and other protein foods   All fresh beef, pork, poultry, fish, seafood, and eggs. Fish canned in water, oil, brine, or vegetable broth. Plain nuts and seeds, peanut butter. Some lunch meat and some frankfurters. Dried beans, dried peas, and lentils. Dairy   Fresh plain, dry, evaporated, or condensed milk. Cream, butter, sour cream, whipping cream, and most yogurts. Unprocessed cheese, most processed cheeses, some cottage cheese, some cream cheeses. Beverages   Coffee, tea, most herbal teas. Carbonated beverages and some root beers. Wine, sake, and distilled spirits, such as gin, vodka, and whiskey. Most hard ciders. Fats and oils   Butter, margarine, vegetable oil, hydrogenated butter, olive oil, shortening, lard, cream, and some mayonnaise. Some commercial salad dressings. Olives. Sweets and desserts   Sugar, honey, some syrups, molasses, jelly, and jam. Plain hard candy, marshmallows, and gumdrops. Pure cocoa powder. Plain chocolate. Custard and some pudding mixes. Gelatin desserts, sorbets, frozen ice pops, and sherbet. Cake, cookies, and other desserts prepared with allowed flours. Some commercial ice creams. Cornstarch, tapioca, and rice puddings. Seasoning and other foods   Some canned or frozen soups. Monosodium glutamate (MSG). Cider, rice, and wine vinegar. Baking soda and baking powder. Cream of tartar. Baking and nutritional yeast. Certain soy sauces made without  wheat (ask your dietitian about specific brands that are allowed). Nuts, coconut, and chocolate. Salt, pepper, herbs, spices, flavoring extracts, imitation or artificial flavorings, natural flavorings, and food colorings. Some medicines and supplements. Some lip glosses and other cosmetics. Rice syrups. The items listed may not be a complete list. Talk with your dietitian about what dietary choices are best for  you. Foods to avoid Grains   Barley, bran, bulgur, couscous, cracked wheat, [place], farro, graham, malt, matzo, semolina, wheat germ, and all wheat and rye cereals including spelt and kamut. Cereals containing malt as a flavoring, such as rice cereal. Noodles, spaghetti, macaroni, most packaged rice mixes, and all mixes containing wheat, rye, barley, or [place]. Vegetables   Most creamed vegetables and most vegetables canned in sauces. Some commercially prepared vegetables and salads. Fruits   Thickened or prepared fruits and some pie fillings. Some fruit snacks and fruit roll-ups. Meats and other protein foods   Any meat or meat alternative containing wheat, rye, barley, or gluten stabilizers. These are often marinated or packaged meats and lunch meats. Bread-containing products, such as Swiss steak, croquettes, meatballs, and meatloaf. Most tuna canned in vegetable broth and [place] with hydrolyzed vegetable protein (HVP) injected as part of the basting. [place]. Imitation fish. Eggs in sauces made from ingredients to avoid. Dairy   Commercial chocolate milk drinks and malted milk. Some non-dairy creamers. Any cheese product containing ingredients to avoid. Beverages   Certain cereal beverages. Beer, ale, malted milk, and some root beers. Some hard ciders. Some instant flavored coffees. Some herbal teas made with barley or with barley malt added. Fats and oils   Some commercial salad dressings. Sour cream containing modified food starch. Sweets and desserts   Some toffees. Chocolate-coated nuts (may be rolled in wheat flour) and some commercial candies and candy bars. Most cakes, cookies, donuts, pastries, and other baked goods. Some commercial ice cream. Ice cream cones. Commercially prepared mixes for cakes, cookies, and other desserts. Bread pudding and other puddings thickened with flour. Products containing brown rice syrup made with barley malt enzyme. Desserts and sweets made with  malt flavoring. Seasoning and other foods   Some curry powders, some dry seasoning mixes, some gravy extracts, some meat sauces, some ketchups, some prepared mustards, and horseradish. Certain soy sauces. Malt vinegar. Bouillon and bouillon cubes that contain HVP. Some chip dips, and some chewing gum. Yeast extract. Brewer's yeast. Caramel color. Some medicines and supplements. Some lip glosses and other cosmetics. The items listed may not be a complete list. Talk with your dietitian about what dietary choices are best for you. Summary  Gluten is a protein that is found in wheat, rye, barley, and some other grains. The gluten-free diet includes all foods that do not contain gluten.  If you need help finding gluten-free foods or if you have questions, talk with your diet and nutrition specialist (registered dietitian) or your health care provider.  Read all food labels. Gluten is often added to foods. Always check the ingredient list and look for warnings, such as "may contain gluten." This information is not intended to replace advice given to you by your health care provider. Make sure you discuss any questions you have with your health care provider. Document Released: 02/15/2005 Document Revised: 12/01/2015 Document Reviewed: 12/01/2015 Elsevier Interactive Patient Education  2017 [employer] Inc[employer]   Lactose Intolerance, Adult Lactose is the natural sugar found in milk and milk products, such as cheese and yogurt. Lactose is digested by lactase, an enzyme in  your small intestine. Some people do not produce enough lactase to digest lactose. This is called lactose intolerance. Lactose intolerance is different from milk allergy, which is a more serious reaction to the protein in milk. What are the causes? Causes of lactose intolerance may include:  Normal aging. The ability to produce lactase may decline with age, causing lactose intolerance over time.  Being born without the ability to make  lactase.  Digestive diseases such as gastroenteritis or inflammatory bowel disease.  Surgery or injuries to your small intestine.  Infection in your intestines.  Certain antibiotic medicines and cancer treatments. What are the signs or symptoms? Lactose intolerance can cause uncomfortable symptoms. These are likely to occur within 30 minutes to 2 hours after eating or drinking foods containing lactose. Symptoms of lactose intolerance may include:  Nausea.  Diarrhea.  Abdominal cramps or pain.  Bloating.  Gas. How is this diagnosed? There are several tests your health care provider can do to diagnose lactose intolerance. These tests include a hydrogen breath test and stool acidity test. How is this treated? No treatment can improve your body's ability to produce lactase. However, your symptoms can be controlled by limiting or avoiding milk products and other sources of lactose and adjusting your diet. Lactose-free milk is often tolerated. Lactose digestion may also be improved by adding lactase drops to regular milk or by taking lactase tablets when dairy products are consumed. Tolerance to lactose is individual. Some people may be able to eat or drink small amounts of products with lactose, while other may need to avoid lactose entirely. Talk to your health care provider about what is best for you. Follow these instructions at home:  Limit or avoidfoods, beverages, and medicines containing lactose as directed by your health care provider.  Read food and medicine labels carefully to avoid products containing lactose, milk solids, casein, or whey.  If you eliminate dairy products, replace the protein, calcium, vitamin D, and other nutrients they contain through other foods. A registered dietitian or your health care provider can help you adjust your diet.  Choose a milk substitute that is fortified with calcium and vitamin D. Be aware that soy milk contains high quality protein,  while milks made from nuts or grains contain very little protein.  Use lactase drops or tablets if directed by your health care provider. Contact a health care provider if: You have no relief from your symptoms after eliminating milk products and other sources of lactose. This information is not intended to replace advice given to you by your health care provider. Make sure you discuss any questions you have with your health care provider. Document Released: 02/15/2005 Document Revised: 07/24/2015 Document Reviewed: 05/18/2013 Elsevier Interactive Patient Education  2017 [employer] Inc[employer]    Lactose-Free Diet, Adult If you have lactose intolerance, you are not able to digest lactose. Lactose is a natural sugar found mainly in milk and milk products. You may need to avoid all foods and beverages that contain lactose. A lactose-free diet can help you do this. What do I need to know about this diet?  Do not consume foods, beverages, vitamins, minerals, or medicines with lactose. Read ingredients lists carefully.  Look for the words "lactose-free" on labels.  Use lactase enzyme drops or tablets as directed by your health care provider.  Use lactose-free milk or a milk alternative, such as soy milk, for drinking and cooking.  Make sure you get enough calcium and vitamin D in your diet. A lactose-free eating  plan can be lacking in these important nutrients.  Take calcium and vitamin D supplements as directed by your health care provider. Talk to your provider about supplements if you are not able to get enough calcium and vitamin D from food. Which foods have lactose? Lactose is found in:  Milk and foods made from milk.  Yogurt.  Cheese.  Butter.  Margarine.  Sour cream.  Cream.  Whipped toppings and nondairy creamers.  Ice cream and other milk-based desserts. Lactose is also found in foods or products made with milk or milk ingredients. To find out whether a food contains milk  or a milk ingredient, look at the ingredients list. Avoid foods with the statement "May contain milk" and foods that contain:  Butter.  Cream.  Milk.  Milk solids.  Milk powder.  Whey.  Curd.  Caseinate.  Lactose.  Lactalbumin.  Lactoglobulin. What are some alternatives to milk and foods made with milk products?  Lactose-free milk.  Soy milk with added calcium and vitamin D.  Almond, coconut, or rice milk with added calcium and vitamin D. Note that these are low in protein.  Soy products, such as soy yogurt, soy cheese, soy ice cream, and soy-based sour cream. Which foods can I eat? Grains  Breads and rolls made without milk, such as [place], [place], or [place] bread, bagels, pita, and [employer]. Corn tortillas, corn meal, grits, and polenta. Crackers without lactose or milk solids, such as soda crackers and graham crackers. Cooked or dry cereals without lactose or milk solids. Pasta, quinoa, couscous, barley, oats, bulgur, farro, rice, wild rice, or other grains prepared without milk or lactose. Plain popcorn. Vegetables  Fresh, frozen, and canned vegetables without cheese, cream, or butter sauces. Fruits  All fresh, canned, frozen, or dried fruits that are not processed with lactose. Meats and Other Protein Sources  Plain beef, chicken, fish, [place], lamb, veal, pork, wild game, or ham. Kosher-prepared meat products. Strained or junior meats that do not contain milk. Eggs. Soy meat substitutes. Beans, lentils, and hummus. Tofu. Nuts and seeds. Peanut or other nut butters without lactose. Soups, casseroles, and mixed dishes without cheese, cream, or milk. Dairy  Lactose-free milk. Soy, rice, or almond milk with added calcium and vitamin D. Soy cheese and yogurt. Beverages  Carbonated drinks. Tea. Coffee, freeze-dried coffee, and some instant coffees. Fruit and vegetable juices. Condiments  Soy sauce. Carob powder. Olives. Gravy made with water. Baker's cocoa. Angie Fava. Pure  seasonings and spices. Ketchup. Mustard. Bouillon. Broth. Sweets and Desserts  Water and fruit ices. Gelatin. Cookies, pies, or cakes made from allowed ingredients, such as angel food cake. Pudding made with water or a milk substitute. Lactose-free tofu desserts. Soy, coconut milk, or rice-milk-based frozen desserts. Sugar. Honey. Jam, jelly, and marmalade. Molasses. Pure sugar candy. Dark chocolate without milk. Marshmallows. Fats and Oils  Margarines and salad dressings that do not contain milk. Berniece Salines. Vegetable oils. Shortening. Mayonnaise. Soy or coconut-based cream. The items listed above may not be a complete list of recommended foods or beverages. Contact your dietitian for more options.  Which foods are not recommended? Grains  Breads and rolls that contain milk. Toaster pastries. Muffins, biscuits, waffles, cornbread, and pancakes. These can be prepared at home, commercial, or from mixes. Sweet rolls, donuts, English muffins, fry bread, lefse, flour tortillas with lactose, or [place] toast made with milk or milk ingredients. Crackers that contain lactose. Corn curls. Cooked or dry cereals with lactose. Vegetables  Creamed or breaded vegetables. Vegetables in a  cheese or butter sauce or with lactose-containing margarines. Instant potatoes. [place] fries. Scalloped or au gratin potatoes. Fruits  None. Meats and Other Protein Sources  Scrambled eggs, omelets, and souffles that contain milk. Creamed or breaded meat, fish, chicken, or [place]. Sausage products, such as wieners and liver sausage. Cold cuts that contain milk solids. Cheese, cottage cheese, ricotta cheese, and cheese spreads. Lasagna and macaroni and cheese. Pizza. Peanut or other nut butters with added milk solids. Casseroles or mixed dishes containing milk or cheese. Dairy  All dairy products, including milk, goat's milk, buttermilk, kefir, acidophilus milk, flavored milk, evaporated milk, condensed milk, dulce de [place], eggnog,  yogurt, cheese, and cheese spreads. Beverages  Hot chocolate. Cocoa with lactose. Instant iced teas. Powdered fruit drinks. Smoothies made with milk or yogurt. Condiments  Chewing gum that has lactose. Cocoa that has lactose. Spice blends if they contain milk products. Artificial sweeteners that contain lactose. Nondairy creamers. Sweets and Desserts  Ice cream, ice milk, gelato, sherbet, and frozen yogurt. Custard, pudding, and mousse. Cake, cream pies, cookies, and other desserts containing milk, cream, cream cheese, or milk chocolate. Pie crust made with milk-containing margarine or butter. Reduced-calorie desserts made with a sugar substitute that contains lactose. Toffee and butterscotch. Milk, white, or dark chocolate that contains milk. Fudge. Caramel. Fats and Oils  Margarines and salad dressings that contain milk or cheese. Cream. Half and half. Cream cheese. Sour cream. Chip dips made with sour cream or yogurt. The items listed above may not be a complete list of foods and beverages to avoid. Contact your dietitian for more information.  Am I getting enough calcium? Calcium is found in many foods that contain lactose and is important for bone health. The amount of calcium you need depends on your age:  Adults younger than 50 years: 1000 mg of calcium a day.  Adults older than 50 years: 1200 mg of calcium a day. If you are not getting enough calcium, other calcium sources include:  Orange juice with calcium added. There are 300-350 mg of calcium in 1 cup of orange juice.  Sardines with edible bones. There are 325 mg of calcium in 3 oz of sardines.  Calcium-fortified soy milk. There are 300-400 mg of calcium in 1 cup of calcium-fortified soy milk.  Calcium-fortified rice or almond milk. There are 300 mg of calcium in 1 cup of calcium-fortified rice or almond milk.  Canned salmon with edible bones. There are 180 mg of calcium in 3 oz of canned salmon with edible  bones.  Calcium-fortified breakfast cereals. There are [phone] mg of calcium in calcium-fortified breakfast cereals.  Tofu set with calcium sulfate. There are 250 mg of calcium in  cup of tofu set with calcium sulfate.  Spinach, cooked. There are 145 mg of calcium in  cup of cooked spinach.  Edamame, cooked. There are 130 mg of calcium in  cup of cooked edamame.  Collard greens, cooked. There are 125 mg of calcium in  cup of cooked collard greens.  Kale, frozen or cooked. There are 90 mg of calcium in  cup of cooked or frozen kale.  Almonds. There are 95 mg of calcium in  cup of almonds.  Broccoli, cooked. There are 60 mg of calcium in 1 cup of cooked broccoli. This information is not intended to replace advice given to you by your health care provider. Make sure you discuss any questions you have with your health care provider. Document Released: 08/07/2001 Document Revised: 07/24/2015 Document Reviewed:  05/18/2013 Elsevier Interactive Patient Education  2017 [employer].

## 2016-06-22 NOTE — Progress Notes (Signed)
.      Impression and Recommendations:    1. Diabetes mellitus without complication ([place])   2. Essential hypertension   3. Hyperlipidemia, unspecified hyperlipidemia type   4. Hypothyroidism, unspecified type   5. Flatulence symptom      Flatulence symptom - Pt upset with fact he was denied by [employer].   I will send message to GI to see why pt was to accepted when referred by me colleague last OV - since sx better- pt declines any changes to current txmnt plan or further referral at this time - advised food diary and consider fodmap in future.  - cont probiotics - will check TSH level - r/o celiac- labs today.   - rec eliminate lactose from diet in future if sx return  Diabetes mellitus without complication ([place]) Well controlled at 6.4 today - cont prudent diet - start exercising which will likely help with GI sx as well   Hypothyroidism - pt takes meds diff than prescribed.  Taking 126mcg T,Th only and 150 the rest of the days.   Will reck levels today  Essential hypertension Well controlled - cont meds - low salt   The patient was counseled, risk factors were discussed, anticipatory guidance given.  Orders Placed This Encounter  Procedures  . TSH + free T4  . Celiac panel  . POCT glycosylated hemoglobin (Hb A1C)     Gross side effects, risk and benefits, and alternatives of medications and treatment plan in general discussed with patient.  Patient is aware that all medications have potential side effects and we are unable to predict every side effect or drug-drug interaction that may occur.   Patient will call with any questions prior to using medication if they have concerns.  Expresses verbal understanding and consents to current therapy and treatment regimen.  No barriers to understanding were identified.  Red flag symptoms and signs discussed in detail.  Patient expressed understanding regarding what to do in case of emergency\urgent symptoms  Please see AVS  handed out to patient at the end of our visit for further patient instructions/ counseling done pertaining to today's office visit.   Return in about 3 months (around 09/21/2016) for dm, GI.     Note: This document was prepared using Dragon voice recognition software and may include unintentional dictation errors.   --------------------------------------------------------------------------------------------------------------------------------------------------------------------------------------------------------------------------------------------    Subjective:    CC:  Chief Complaint  Patient presents with  . Diabetes  . Gas    HPI: Kyle Simon is a 83 y.o. male who presents to [place] [facility] today for issues as discussed below.  DM: Pt returns for f/u of diabetes mellitus  DM type: II   Blood sugars:   Not checking, denies highs or low sx  Dx'ed: II  Complications:  None  Therapy:  Diet controlled  DKA:   Never   Severe hypoglycemia:    Never  Pancreatitis:    Never   Lab Results  Component Value Date   HGBA1C 6.4 06/22/2016     HTN: - Patient reports good compliance with treatment - Denies medication S-E  - He denies new onset of: chest pain, exercise intolerance, shortness of breath, dizziness, visual changes, headache, lower extremity swelling or claudication.   Today their BP is BP: 136/70  Last 3 blood pressure readings in our office are as follows: BP Readings from Last 3 Encounters:  06/22/16 136/70  04/14/16 116/71  01/29/16 (!) 141/70  Overwt:  - down a little from prior.  No exercise or walking. Prudent diet  Wt Readings from Last 3 Encounters:  06/22/16 197 lb (89.4 kg)  04/14/16 205 lb 6.4 oz (93.2 kg)  12/29/15 190 lb 12.8 oz (86.5 kg)    Pulse Readings from Last 3 Encounters:  06/22/16 (!) 56  04/14/16 60  01/29/16 (!) 53    BMI Readings from Last 3 Encounters:  06/22/16 27.87 kg/m    04/14/16 29.06 kg/m  12/29/15 26.99 kg/m    Flatulence:  - still having gas and only occ has a "wet fart".  Getting better but pt very upset about how GI would not see him when [facility] referred him to.  Says he is on the mend and still cannot relate sx to anything specific he is eating.  Never did FODMAP or elimination diet like we discussed in past.     Patient Care Team    Relationship Specialty Notifications Start End  Mellody Dance, DO PCP - General Family Medicine  08/05/15   Rana Snare, MD Consulting Physician Urology  11/10/15   Lavonna Monarch, MD Consulting Physician Dermatology  6/85/48   Jodi Marble, MD Consulting Physician Otolaryngology  11/10/15   Calton Dach, MD Referring Physician Optometry  11/10/15   Inocencio Homes, DPM Consulting Physician Podiatry  11/10/15   Bo Merino, MD Consulting Physician Rheumatology  11/10/15   Vickey Huger, MD Consulting Physician Orthopedic Surgery  11/10/15   Erline Levine, MD Consulting Physician Neurosurgery  11/10/15   Deboraha Sprang, MD Consulting Physician Cardiology  01/04/16    Comment: seen '12 last     Patient Active Problem List   Diagnosis Date Noted  . High risk medications (not anticoagulants) long-term use 01/04/2016    Priority: High  . Diabetes mellitus without complication ([place]) 83/03/4157    Priority: High  . Essential hypertension 03/20/2014    Priority: High  . Hyperlipidemia 04/19/2007    Priority: High  . BPH (benign prostatic hyperplasia) 04/18/2007    Priority: Medium  . Hypothyroidism 04/20/2006    Priority: Medium  . Flatulence symptom 07/25/2015    Priority: Low  . Diarrhea 06/21/2015    Priority: Low  . B12 deficiency anemia 01/22/2013    Priority: Low  . Anemia, unspecified 08/13/2012    Priority: Low  . GERD (gastroesophageal reflux disease) 03/26/2011    Priority: Low  . Cough 01/04/2016  . Hypomagnesemia 12/29/2015  . h/o Hyponatremia 12/29/2015  . Cerumen impaction 08/25/2015  .  Chronic rhinitis 11/01/2014  . Spondylolisthesis of lumbar region 10/23/2013  . Diverticulosis of colon without hemorrhage 01/22/2013  . Synovial cyst of lumbar spine 07/26/2011  . Spasms of the hands or feet 07/26/2011  . SENILE KERATOSIS 06/12/2008  . SNORING, HX OF 04/19/2007    Past Medical history, Surgical history, Family history, Social history, Allergies and Medications have been entered into the medical record, reviewed and changed as needed.    Current Meds  Medication Sig  . acetaminophen (TYLENOL) 500 MG tablet Take 500 mg by mouth every 8 (eight) hours as needed for mild pain.   Marland Kitchen aspirin EC 81 MG tablet Take 81 mg by mouth daily.  Marland Kitchen CALCIUM-MAGNESIUM-ZINC PO Take 1 tablet by mouth daily.  . celecoxib (CELEBREX) 200 MG capsule Take 200 mg by mouth every other day.   Marland Kitchen glucose blood (PRECISION XTRA TEST STRIPS) test strip USE TO CHECK BLOOD SUGAR ONCE DAILY DX E11.9  . Hypromellose (ARTIFICIAL TEARS OP) Place  1 drop into both eyes 2 (two) times daily.  Marland Kitchen levothyroxine (SYNTHROID, LEVOTHROID) 150 MCG tablet   . LEVOXYL 125 MCG tablet   . losartan (COZAAR) 50 MG tablet TAKE ONE-HALF (1/2) TABLET EVERY OTHER DAY  . Multiple Vitamin (MULTIVITAMIN WITH MINERALS) TABS Take 1 tablet by mouth daily.  . NONFORMULARY OR COMPOUNDED ITEM Glucometer Museum/gallery curator XTRA- Model #[id number])  . Omega-3 Fatty Acids (FISH OIL PO) Take 1 capsule by mouth daily.  . Probiotic Product (RESTORA) CAPS One tab qd  . Tamsulosin HCl (FLOMAX) 0.4 MG CAPS Take 0.4 mg by mouth 2 (two) times daily.     Allergies:  Allergies  Allergen Reactions  . Simvastatin Other (See Comments)    Zocor=Muscle cramps  . Oxycodone Other (See Comments)    Severe constipation    Review of Systems: General:   Denies fever, chills, unexplained weight loss.  Optho/Auditory:   Denies visual changes, blurred vision/LOV Respiratory:   Denies wheeze, DOE more than baseline levels.  Cardiovascular:   Denies chest pain,  palpitations, new onset peripheral edema  Gastrointestinal:   Denies nausea, vomiting, diarrhea, abd pain.  Genitourinary: Denies dysuria, freq/ urgency, flank pain or discharge from genitals.  Endocrine:     Denies hot or cold intolerance, polyuria, polydipsia. Musculoskeletal:   Denies unexplained myalgias, joint swelling, unexplained arthralgias, gait problems.  Skin:  Denies new onset rash, suspicious lesions Neurological:     Denies dizziness, unexplained weakness, numbness  Psychiatric/Behavioral:   Denies mood changes, suicidal or homicidal ideations, hallucinations  Objective:   Blood pressure 136/70, pulse (!) 56, height 5' 10.5" (1.791 m), weight 197 lb (89.4 kg). Body mass index is 27.87 kg/m. General:  Well Developed, well nourished, appropriate for stated age.  Neuro:  Alert and oriented,  extra-ocular muscles intact  HEENT:  Normocephalic, atraumatic, neck supple, no carotid bruits appreciated  Skin:  no gross rash, warm, pink. Cardiac:  RRR, S1 S2, + M Respiratory:  ECTA B/L and A/P, Not using accessory muscles, speaking in full sentences- unlabored. Vascular:  Ext warm, no cyanosis apprec.; cap RF less 2 sec. Psych:  No HI/SI, judgement and insight good, Euthymic mood. Full Affect.

## 2016-06-22 NOTE — Assessment & Plan Note (Addendum)
-   Pt upset with fact he was denied by [employer].   I will send message to GI to see why pt was to accepted when referred by me colleague last OV - since sx better- pt declines any changes to current txmnt plan or further referral at this time - advised food diary and consider fodmap in future.  - cont probiotics - will check TSH level - r/o celiac- labs today.   - rec eliminate lactose from diet in future if sx return

## 2016-06-23 LAB — TSH+FREE T4
Free T4: 1.47 ng/dL (ref 0.82–1.77)
TSH: 5.07 u[IU]/mL — ABNORMAL HIGH (ref 0.450–4.500)

## 2016-06-23 LAB — GLIA (IGA/G) + TTG IGA
Antigliadin Abs, IgA: 4 units (ref 0–19)
Gliadin IgG: 2 units (ref 0–19)
Transglutaminase IgA: <2 U/mL (ref 0–3)

## 2016-06-23 NOTE — Assessment & Plan Note (Signed)
-   pt takes meds diff than prescribed.  Taking 139mcg T,Th only and 150 the rest of the days.   Will reck levels today

## 2016-06-23 NOTE — Assessment & Plan Note (Signed)
Well controlled - cont meds - low salt

## 2016-06-28 ENCOUNTER — Telehealth: Payer: Self-pay | Admitting: Family Medicine

## 2016-06-28 NOTE — Telephone Encounter (Signed)
Pt called states he rcvd message on My Chart from nurse stating she needed his  Thyroid medication information-- he states he take levothyroxine / Synthroid / Levthroid 150 MCG tablets (.15 MG on  Sunday & Monday)--- He takes 1.25 tablets on (Tues- Saturday)-- pls cll him back--glh

## 2016-06-28 NOTE — Telephone Encounter (Signed)
Pt returned Tonya's call states she let message regarding his Thyroid medicine--Pt states he takes levothyroxine / Synthroid

## 2016-06-28 NOTE — Telephone Encounter (Signed)
Please confirm how you would like for the patient to take thyroid replacement hormone based on his last labs.  Charyl Bigger, CMA

## 2016-06-29 NOTE — Telephone Encounter (Signed)
MyChart message sent to patient with medication directions.  Charyl Bigger, CMA

## 2016-06-29 NOTE — Telephone Encounter (Signed)
Take the 166mcg M, W, F and the 121mcg the rest of the days of the week.   Reck 6 wks-  tsh, T4

## 2016-06-30 ENCOUNTER — Encounter: Payer: Self-pay | Admitting: Family Medicine

## 2016-07-05 ENCOUNTER — Other Ambulatory Visit: Payer: Self-pay | Admitting: Family Medicine

## 2016-07-12 DIAGNOSIS — R351 Nocturia: Secondary | ICD-10-CM | POA: Diagnosis not present

## 2016-07-12 DIAGNOSIS — R3912 Poor urinary stream: Secondary | ICD-10-CM | POA: Diagnosis not present

## 2016-07-12 DIAGNOSIS — N401 Enlarged prostate with lower urinary tract symptoms: Secondary | ICD-10-CM | POA: Diagnosis not present

## 2016-07-16 DIAGNOSIS — M810 Age-related osteoporosis without current pathological fracture: Secondary | ICD-10-CM | POA: Insufficient documentation

## 2016-07-16 DIAGNOSIS — Z96652 Presence of left artificial knee joint: Secondary | ICD-10-CM | POA: Insufficient documentation

## 2016-07-16 DIAGNOSIS — Z981 Arthrodesis status: Secondary | ICD-10-CM | POA: Insufficient documentation

## 2016-07-16 NOTE — Progress Notes (Signed)
Office Visit Note  Patient: Kyle Simon             Date of Birth: [birth year]           MRN: [id number]             PCP: Mellody Dance, DO Referring: Mellody Dance, DO Visit Date: 07/29/2016 Occupation: @GUAROCC @    Subjective:  Right knee pain.   History of Present Illness: Kyle Simon is a 83 y.o. male with history of disc disease and osteoarthritis. He states he continues to have some discomfort in his right knee joint. He had Visco supplement injections by Dr. Lorre Nick in April 2018 but he has not had much relief from that. He also had arthroscopic surgery to the right knee joint and December 2017 without much results. Left total knee replacement is doing well. He is off and on discomfort in his lower back with doing certain activities.  Activities of Daily Living:  Patient reports morning stiffness for 0 minute.   Patient Denies nocturnal pain.  Difficulty dressing/grooming: Denies Difficulty climbing stairs: Reports Difficulty getting out of chair: Reports Difficulty using hands for taps, buttons, cutlery, and/or writing: Denies   Review of Systems  Constitutional: Negative for fatigue, night sweats and weakness ( ).  HENT: Negative for mouth sores, mouth dryness and nose dryness.   Eyes: Negative for redness and dryness.  Respiratory: Negative for shortness of breath and difficulty breathing.   Cardiovascular: Negative for chest pain, palpitations, hypertension, irregular heartbeat and swelling in legs/feet.  Gastrointestinal: Negative for constipation and diarrhea.  Endocrine: Negative for increased urination.  Musculoskeletal: Positive for arthralgias and joint pain. Negative for joint swelling, myalgias, muscle weakness, morning stiffness, muscle tenderness and myalgias.  Skin: Negative for color change, rash, hair loss, nodules/bumps, skin tightness, ulcers and sensitivity to sunlight.  Allergic/Immunologic: Negative for susceptible to infections.    Neurological: Negative for dizziness, fainting, memory loss and night sweats.  Hematological: Negative for swollen glands.  Psychiatric/Behavioral: Negative for depressed mood and sleep disturbance. The patient is not nervous/anxious.     PMFS History:  Patient Active Problem List   Diagnosis Date Noted  . Age-related osteoporosis without current pathological fracture 07/16/2016  . History of lumbar fusion 07/16/2016  . History of total knee arthroplasty, left 07/16/2016  . Cough 01/04/2016  . High risk medications (not anticoagulants) long-term use 01/04/2016  . Hypomagnesemia 12/29/2015  . h/o Hyponatremia 12/29/2015  . Cerumen impaction 08/25/2015  . Diabetes mellitus without complication ([place]) 39/76/7341  . Flatulence symptom 07/25/2015  . Diarrhea 06/21/2015  . Chronic rhinitis 11/01/2014  . Essential hypertension 03/20/2014  . Spondylolisthesis of lumbar region 10/23/2013  . Diverticulosis of colon without hemorrhage 01/22/2013  . B12 deficiency anemia 01/22/2013  . Anemia, unspecified 08/13/2012  . Synovial cyst of lumbar spine 07/26/2011  . Spasms of the hands or feet 07/26/2011  . GERD (gastroesophageal reflux disease) 03/26/2011  . SENILE KERATOSIS 06/12/2008  . Hyperlipidemia 04/19/2007  . SNORING, HX OF 04/19/2007  . BPH (benign prostatic hyperplasia) 04/18/2007  . Hypothyroidism 04/20/2006    Past Medical History:  Diagnosis Date  . Arthritis    BOTH KNEES and hands;Dr.Deveshwar  . Constipation due to pain medication   . Enlarged prostate   . Frequent urination at night   . GERD (gastroesophageal reflux disease)   . Hyperlipidemia   . Hypothyroidism   . Polio    as a child w/o complications  . Snoring    no sleep  apnea  . Torn meniscus   . Transfusion history 2012   post TKR    Family History  Problem Relation Age of Onset  . Sudden death Father        MVA  . Coronary artery disease Mother   . Emphysema Mother   . Diverticulosis Mother   .  Asthma Brother   . Bone cancer Brother   . Diabetes Paternal Grandmother   . Sarcoidosis Son   . Anesthesia problems Neg Hx   . Hypotension Neg Hx   . Malignant hyperthermia Neg Hx   . Pseudochol deficiency Neg Hx   . Colon cancer Neg Hx    Past Surgical History:  Procedure Laterality Date  . BACK SURGERY  2011   hemiarthroplasty 01/11/1999 and 6  . CARDIAC CATHETERIZATION  09/2001   negative  . EYE SURGERY  2003   R&L cataract removed & IOL- 2003 & 2007,Dr.Jenkins  . INGUINAL HERNIA REPAIR  2002   right, Dr. Truitt Leep  . JOINT REPLACEMENT  2012   left knee replacement  . LIPOMA EXCISION     back  . MENISCECTOMY Bilateral    Dr. Noemi Chapel  . PROSTATE SURGERY  07/2006   for urinary urgency, Dr. Risa Grill  . ROTATOR CUFF REPAIR Right October, 2016  . sinus & throat surgery-1995  1995  . TONSILLECTOMY    . TOTAL KNEE ARTHROPLASTY  01/11/2011   Procedure: TOTAL KNEE ARTHROPLASTY;  Surgeon: Rudean Haskell, MD;  Location: [facility];  Service: Orthopedics;  Laterality: Left;  . UVULECTOMY  6270   Dr. Erik Obey  . VASECTOMY    . WRIST SURGERY  1959   fracture- right   Social History   Social History Narrative  . No narrative on file     Objective: Vital Signs: BP 130/70 (BP Location: Left Arm, Patient Position: Sitting, Cuff Size: Normal)   Pulse 74   Resp 17   Ht 5\' 11"  (1.803 m)   Wt 197 lb (89.4 kg)   BMI 27.48 kg/m    Physical Exam  Constitutional: He is oriented to person, place, and time. He appears well-developed and well-nourished.  HENT:  Head: Normocephalic and atraumatic.  Eyes: Conjunctivae and EOM are normal. Pupils are equal, round, and reactive to light.  Neck: Normal range of motion. Neck supple.  Cardiovascular: Normal rate, regular rhythm and normal heart sounds.   Pulmonary/Chest: Effort normal and breath sounds normal.  Abdominal: Soft. Bowel sounds are normal.  Neurological: He is alert and oriented to person, place, and time.  Skin: Skin is warm and  dry. Capillary refill takes less than 2 seconds.  Psychiatric: He has a normal mood and affect. His behavior is normal.  Nursing note and vitals reviewed.    Musculoskeletal Exam: C-spine good range of motion he has thoracic kyphosis. Lumbar spine limited range of motion due to lumbar fusion. Right shoulder abduction is limited to 90 left shoulder joint good range of motion. He has mild PIP/DIP thickening in his hands. Right knee joint good range of motion with some crepitus left knee joint is replaced which is doing well. Although joints afford range of motion.  CDAI Exam: No CDAI exam completed.    Investigation: No additional findings.   Imaging: No results found.  Speciality Comments: No specialty comments available.    Procedures:  No procedures performed Allergies: Simvastatin and Oxycodone   Assessment / Plan:     Visit Diagnoses: Age-related osteoporosis without current pathological fracture - 05/27/2015 DEXA  T score -2.5 right radius. We had detailed discussion regarding different treatment options including bisphosphonates. At this point he wants to hold off treatment he states he is lower GI issues and he would prefer not to take Fosamax I also discussed the option of IV Reclast. He states he just started taking calcium few months back and he would like to see his repeat bone density before we will decide on osteoporosis treatment. We will repeat his DEXA next year in February. I've also advised him to continue to take vitamin D. He will get his vitamin D level checked again with his PCP and he has Next set of labs. Need for resistive exercises will also discussed.  Spondylolisthesis of lumbar region: He has intermittent pain overall doing well  History of lumbar fusion  History of total knee arthroplasty, left: Doing extremely well  Right knee joint osteoarthritis which continues to give him trouble. We had detailed discussion regarding total knee replacement he  states at this point he would like to hold off.  History of anemia  High risk medications (not anticoagulants) long-term use: On Celebrex his labs have been stable. We will get labs with his PCP. I've advised him to use Celebrex only on when necessary basis.  History of diabetes mellitus  History of hyperlipidemia  History of gastroesophageal reflux (GERD)  Essential hypertension    Orders: No orders of the defined types were placed in this encounter.  No orders of the defined types were placed in this encounter.   Face-to-face time spent with patient was 30 minutes. 50% of time was spent in counseling and coordination of care.  Follow-Up Instructions: No Follow-up on file.   Bo Merino, MD  Note - This record has been created using Editor, commissioning.  Chart creation errors have been sought, but may not always  have been located. Such creation errors do not reflect on  the standard of medical care.

## 2016-07-29 ENCOUNTER — Ambulatory Visit (INDEPENDENT_AMBULATORY_CARE_PROVIDER_SITE_OTHER): Payer: Medicare Other | Admitting: Rheumatology

## 2016-07-29 ENCOUNTER — Encounter: Payer: Self-pay | Admitting: Rheumatology

## 2016-07-29 VITALS — BP 130/70 | HR 74 | Resp 17 | Ht 71.0 in | Wt 197.0 lb

## 2016-07-29 DIAGNOSIS — I10 Essential (primary) hypertension: Secondary | ICD-10-CM | POA: Diagnosis not present

## 2016-07-29 DIAGNOSIS — Z79899 Other long term (current) drug therapy: Secondary | ICD-10-CM | POA: Diagnosis not present

## 2016-07-29 DIAGNOSIS — Z8719 Personal history of other diseases of the digestive system: Secondary | ICD-10-CM | POA: Diagnosis not present

## 2016-07-29 DIAGNOSIS — E119 Type 2 diabetes mellitus without complications: Secondary | ICD-10-CM | POA: Diagnosis not present

## 2016-07-29 DIAGNOSIS — Z862 Personal history of diseases of the blood and blood-forming organs and certain disorders involving the immune mechanism: Secondary | ICD-10-CM | POA: Diagnosis not present

## 2016-07-29 DIAGNOSIS — Z96652 Presence of left artificial knee joint: Secondary | ICD-10-CM

## 2016-07-29 DIAGNOSIS — Z981 Arthrodesis status: Secondary | ICD-10-CM | POA: Diagnosis not present

## 2016-07-29 DIAGNOSIS — M810 Age-related osteoporosis without current pathological fracture: Secondary | ICD-10-CM

## 2016-07-29 DIAGNOSIS — Z8639 Personal history of other endocrine, nutritional and metabolic disease: Secondary | ICD-10-CM

## 2016-07-29 DIAGNOSIS — M4316 Spondylolisthesis, lumbar region: Secondary | ICD-10-CM | POA: Diagnosis not present

## 2016-07-29 NOTE — Patient Instructions (Signed)
CBC, CMP and vitamin D with PCP.

## 2016-07-30 ENCOUNTER — Other Ambulatory Visit: Payer: Self-pay | Admitting: Family Medicine

## 2016-07-30 DIAGNOSIS — E038 Other specified hypothyroidism: Secondary | ICD-10-CM

## 2016-07-30 NOTE — Telephone Encounter (Signed)
We have not prescribed these medications for the patient previously.  Please review and refill if appropriate.  T. Nelson, CMA  

## 2016-08-03 NOTE — Telephone Encounter (Signed)
Levothyroxine refill sent to pharmacy 

## 2016-08-03 NOTE — Telephone Encounter (Signed)
Okay to refill the levothyroxine 150 g to be dispense and take Monday Wednesday Friday only.  Please dispense 90 day supply. #36    1 refill.

## 2016-08-03 NOTE — Telephone Encounter (Signed)
Patient takes Levothyroxine 170mcg M, W, F and Levoxyl 171mcg the rest of the days of the week.

## 2016-08-03 NOTE — Telephone Encounter (Signed)
Please call the pharmacy he gets his meds at and please confirm exact dose of thyroid meds, exactly how he is taking it.    We keep on getting a different story about different meds he is taking for his thyroid and we really need to clarify this once and for all.  THANKS ladies!

## 2016-09-07 DIAGNOSIS — M7061 Trochanteric bursitis, right hip: Secondary | ICD-10-CM | POA: Diagnosis not present

## 2016-09-15 ENCOUNTER — Ambulatory Visit: Payer: Medicare Other | Admitting: Family Medicine

## 2016-09-15 DIAGNOSIS — D519 Vitamin B12 deficiency anemia, unspecified: Secondary | ICD-10-CM

## 2016-09-15 DIAGNOSIS — E039 Hypothyroidism, unspecified: Secondary | ICD-10-CM

## 2016-09-15 DIAGNOSIS — D509 Iron deficiency anemia, unspecified: Secondary | ICD-10-CM

## 2016-09-15 DIAGNOSIS — H6981 Other specified disorders of Eustachian tube, right ear: Secondary | ICD-10-CM

## 2016-09-15 DIAGNOSIS — E119 Type 2 diabetes mellitus without complications: Secondary | ICD-10-CM

## 2016-09-15 DIAGNOSIS — E785 Hyperlipidemia, unspecified: Secondary | ICD-10-CM | POA: Diagnosis not present

## 2016-09-15 DIAGNOSIS — I10 Essential (primary) hypertension: Secondary | ICD-10-CM | POA: Diagnosis not present

## 2016-09-15 DIAGNOSIS — S0911XA Strain of muscle and tendon of head, initial encounter: Secondary | ICD-10-CM | POA: Insufficient documentation

## 2016-09-15 DIAGNOSIS — M810 Age-related osteoporosis without current pathological fracture: Secondary | ICD-10-CM | POA: Diagnosis not present

## 2016-09-15 MED ORDER — CYCLOBENZAPRINE HCL 5 MG PO TABS: 5.0000 mg | ORAL_TABLET | Freq: Three times a day (TID) | ORAL | 0 refills | Status: DC | PRN

## 2016-09-15 MED ORDER — FLUTICASONE PROPIONATE 50 MCG/ACT NA SUSP: 2.0000 | Freq: Every day | NASAL | 2 refills | Status: DC

## 2016-09-15 NOTE — Patient Instructions (Addendum)
1)   Please let me know if you would like PT for your masseter muscle tightness on the R.  I think this is causing your jaw pain and now  It has a spasm.  F/up with your dentist to have your TMJ evaluated and see if night guard would help.  2)  For your ETD on R--- use neil med sinus rinses twice daily. Then use flonase afterwards  3)  Thyroid- we will await your labs to see  what changes need to be made.  Take the 172mcg T,Th only and 164mcg the rest of the days as you weret aking prior to last labs- this will help with your fatigue.   4) If the changes to your thyroid meds do not imporve your muscle weakness in legs---> please let me know as you will need further tests/ eval for N-M SK d/o.  Lets give it 2 months

## 2016-09-15 NOTE — Progress Notes (Signed)
Impression and Recommendations:    1. Hypothyroidism, unspecified type   2. Diabetes mellitus without complication (HCC)   3. Essential hypertension   4. Hyperlipidemia, unspecified hyperlipidemia type   5. Iron deficiency anemia, unspecified iron deficiency anemia type   6. Anemia due to vitamin B12 deficiency, unspecified B12 deficiency type   7. ETD (Eustachian tube dysfunction), right   8. Strain of masseter muscle, initial encounter- R     1)   Please let me know if you would like PT for your masseter muscle tightness on the R.  I think this is causing your jaw pain and now  It has a spasm.  F/up with your dentist to have your TMJ evaluated and see if night guard would help.  2)  For your ETD on R--- use neil med sinus rinses twice daily. Then use flonase afterwards  3)  Thyroid- we will await your labs to see  what changes need to be made.  Take the T,Th only and the rest of the days as you weret aking prior to last labs- this will help with your fatigue.   4) If the changes to your thyroid meds do not imporve your muscle weakness in legs---> please let me know as you will need further tests/ eval for N-M SK d/o.  Lets give it 2 months Education and routine counseling performed. Handouts provided.  New Prescriptions   CYCLOBENZAPRINE (FLEXERIL) 5 MG TABLET    Take 1 tablet (5 mg total) by mouth 3 (three) times daily as needed for muscle spasms.   FLUTICASONE (FLONASE) 50 MCG/ACT NASAL SPRAY    Place 2 sprays into both nostrils daily.    Meds ordered this encounter  Medications   cyclobenzaprine (FLEXERIL) 5 MG tablet    Sig: Take 1 tablet (5 mg total) by mouth 3 (three) times daily as needed for muscle spasms.    Dispense:  30 tablet    Refill:  0   fluticasone (FLONASE) 50 MCG/ACT nasal spray    Sig: Place 2 sprays into both nostrils daily.    Dispense:  16 g    Refill:  2     Discontinued Medications   No medications on file      Orders  Placed This Encounter  Procedures   TSH   T4, free   Hemoglobin A1c   Lipid panel   CBC with Differential/Platelet   VITAMIN D 25 Hydroxy (Vit-D Deficiency, Fractures)   Comprehensive metabolic panel   Vitamin B12   TSH   T4, free     Return in about 8 weeks (around 11/10/2016), or Diabetes, HTN, Thyroid Follow up Lab results, for f/up 2 mo for eval muscles of legs.  The patient was counseled, risk factors were discussed, anticipatory guidance given.  Gross side effects, risk and benefits, and alternatives of medications discussed with patient.  Patient is aware that all medications have potential side effects and we are unable to predict every side effect or drug-drug interaction that may occur.  Expresses verbal understanding and consents to current therapy plan and treatment regimen.  Please see AVS handed out to patient at the end of our visit for further patient instructions/ counseling done pertaining to today's office visit.    Note:  This document was prepared using Dragon voice recognition software and may include unintentional dictation errors.     Subjective:    Chief Complaint  Patient presents with   Jaw Pain  right side jaw pain radiates into right ear x 6 days      SKIPPY ROHN is a 83 y.o. male who presents to [facility] today for Diabetes Management.    R TMJ:  Thursday of last week. Was visiting son and was out of town.  Was chewing meat- got a severe pain in R jaw, was bad until he quit eating.  Also feels like a fullness in R ear- started yesterday while [place] the [place]. Coming from [place].  R ear pain: h/o R ETD dysfxn- had seen ENT in past for this.     Thyroid:  Has been feeling a little fatigued   History of Polio as child. 29 yo or so.  48mo in hosp and 1 yr total before back to normal..   Past couple weeks has been having episodes of Fuzzy feeling in his back and legs b/l. Episodes a couple times  per day and lasts or so.  Weakness in legs b/l- constant and more fatigued than usual of leg muscles.  No numbness, no dysfxn.  Has on going leg cramps time to time.   Problem  Etd (Eustachian Tube Dysfunction), Right  Strain of Masseter Muscle     DM HPI: -  He  been working on diet and exercise for diabetes  Pt is currently maintained on the following medications for diabetes:   see med list today Medication compliance -   Home glucose readings range  92 in AM's, and runs around 115.    Denies polyuria/polydipsia. Denies hypo/ hyperglycemia symptoms - He denies new onset of: chest pain, exercise intolerance, shortness of breath, dizziness, visual changes, headache, lower extremity swelling or claudication.   Complications:  polyneuropathy , proliferative retinopathy  , Proteinuira  Last diabetic eye exam was  Lab Results  Component Value Date   HMDIABEYEEXA No Retinopathy 01/11/2016    Foot exam- UTD  Last A1C in the office was:  Lab Results  Component Value Date   HGBA1C 6.4 06/22/2016   HGBA1C 6.2 02/04/2016   HGBA1C 6.3 (H) 10/17/2015    Lab Results  Component Value Date   MICROALBUR 0.3 11/10/2015   LDLCALC 55 07/29/2015   CREATININE 0.87 07/29/2015    Last 3 blood pressure readings in our office are as follows: BP Readings from Last 3 Encounters:  07/29/16 130/70  06/22/16 136/70  04/14/16 116/71    @BMILAST3 @    Patient Care Team    Relationship Specialty Notifications Start End  Thomasene Lot, DO PCP - General Family Medicine  08/05/15   Barron Alvine, MD Consulting Physician Urology  11/10/15   Janalyn Harder, MD Consulting Physician Dermatology  11/10/15   Flo Shanks, MD Consulting Physician Otolaryngology  11/10/15   Francene Boyers, MD Referring Physician Optometry  11/10/15   Merwyn Katos, DPM Consulting Physician Podiatry  11/10/15   Pollyann Savoy, MD Consulting Physician Rheumatology  11/10/15   Dannielle Huh, MD Consulting  Physician Orthopedic Surgery  11/10/15   Maeola Harman, MD Consulting Physician Neurosurgery  11/10/15   Duke Salvia, MD Consulting Physician Cardiology  01/04/16    Comment: seen '12 last     Patient Active Problem List   Diagnosis Date Noted   High risk medications (not anticoagulants) long-term use 01/04/2016    Priority: High   Diabetes mellitus without complication (HCC) 08/05/2015    Priority: High   Essential hypertension 03/20/2014    Priority: High  Hyperlipidemia 04/19/2007    Priority: High   BPH (benign prostatic hyperplasia) 04/18/2007    Priority: Medium   Hypothyroidism 04/20/2006    Priority: Medium   Flatulence symptom 07/25/2015    Priority: Low   Diarrhea 06/21/2015    Priority: Low   B12 deficiency anemia 01/22/2013    Priority: Low   Anemia, unspecified 08/13/2012    Priority: Low   GERD (gastroesophageal reflux disease) 03/26/2011    Priority: Low   ETD (Eustachian tube dysfunction), right 09/15/2016   Strain of masseter muscle 09/15/2016   Trochanteric bursitis of right hip 09/07/2016   Age-related osteoporosis without current pathological fracture 07/16/2016   History of lumbar fusion 07/16/2016   History of total knee arthroplasty, left 07/16/2016   Cough 01/04/2016   Hypomagnesemia 12/29/2015   h/o Hyponatremia 12/29/2015   Cerumen impaction 08/25/2015   Chronic rhinitis 11/01/2014   Spondylolisthesis of lumbar region 10/23/2013   Diverticulosis of colon without hemorrhage 01/22/2013   Synovial cyst of lumbar spine 07/26/2011   Spasms of the hands or feet 07/26/2011   SENILE KERATOSIS 06/12/2008   SNORING, HX OF 04/19/2007     Past Medical History:  Diagnosis Date   Arthritis    BOTH KNEES and hands;Dr.Deveshwar   Constipation due to pain medication    Enlarged prostate    Frequent urination at night    GERD (gastroesophageal reflux disease)    Hyperlipidemia    Hypothyroidism    Polio    as a child w/o complications   Snoring     no sleep apnea   Torn meniscus    Transfusion history 2012   post TKR     Past Surgical History:  Procedure Laterality Date   BACK SURGERY  2011   hemiarthroplasty 01/11/1999 and 6   CARDIAC CATHETERIZATION  09/2001   negative   EYE SURGERY  2003   R&L cataract removed & IOL- 2003 & 2007,Dr.Jenkins   INGUINAL HERNIA REPAIR  2002   right, Dr. Luan Pulling   JOINT REPLACEMENT  2012   left knee replacement   LIPOMA EXCISION     back   MENISCECTOMY Bilateral    Dr. Thurston Hole   PROSTATE SURGERY  07/2006   for urinary urgency, Dr. Tiana Loft CUFF REPAIR Right October, 2016   sinus & throat surgery-1995  1995   TONSILLECTOMY     TOTAL KNEE ARTHROPLASTY  01/11/2011   Procedure: TOTAL KNEE ARTHROPLASTY;  Surgeon: Raymon Mutton, MD;  Location: [facility] OR;  Service: Orthopedics;  Laterality: Left;   UVULECTOMY  1996   Dr. Lazarus Salines   VASECTOMY     WRIST SURGERY  1959   fracture- right     Family History  Problem Relation Age of Onset   Sudden death Father        MVA   Coronary artery disease Mother    Emphysema Mother    Diverticulosis Mother    Asthma Brother    Bone cancer Brother    Diabetes Paternal Grandmother    Sarcoidosis Son    Anesthesia problems Neg Hx    Hypotension Neg Hx    Malignant hyperthermia Neg Hx    Pseudochol deficiency Neg Hx    Colon cancer Neg Hx      History  Drug Use No  ,  History  Alcohol Use   2.4 oz/week   4 Shots of liquor per week    Comment: social  ,  History  Smoking Status  Former Smoker   Packs/day: 1.00   Years: 18.00   Quit date: 03/01/1966  Smokeless Tobacco   Never Used  ,    Current Outpatient Prescriptions on File Prior to Visit  Medication Sig Dispense Refill   acetaminophen (TYLENOL) 500 MG tablet Take 500 mg by mouth every 8 (eight) hours as needed for mild pain.      aspirin EC 81 MG tablet Take 81 mg by mouth daily.     CALCIUM-MAGNESIUM-ZINC PO Take 1 tablet by mouth daily.     celecoxib (CELEBREX)  200 MG capsule Take 200 mg by mouth every other day.      glucose blood (PRECISION XTRA TEST STRIPS) test strip USE TO CHECK BLOOD SUGAR ONCE DAILY DX E11.9 100 each 2   Hypromellose (ARTIFICIAL TEARS OP) Place 1 drop into both eyes 2 (two) times daily.     levothyroxine (SYNTHROID, LEVOTHROID) 150 MCG tablet Take 1 tablet Monday, Wednesday and Friday morning 36 tablet 1   LEVOXYL 125 MCG tablet Tuesday, Thursday, Saturday, Sunday     losartan (COZAAR) 50 MG tablet TAKE ONE-HALF (1/2) TABLET EVERY OTHER DAY 90 tablet 1   Multiple Vitamin (MULTIVITAMIN WITH MINERALS) TABS Take 1 tablet by mouth daily.     NONFORMULARY OR COMPOUNDED ITEM Glucometer (Precision XTRA- Model #XCC [id number]) 1 each 0   Omega-3 Fatty Acids (FISH OIL PO) Take 1 capsule by mouth daily.     Probiotic Product (RESTORA) CAPS One tab qd 90 capsule 3   Tamsulosin HCl (FLOMAX) 0.4 MG CAPS Take 0.4 mg by mouth 2 (two) times daily.      No current facility-administered medications on file prior to visit.      Allergies  Allergen Reactions   Simvastatin Other (See Comments)    Zocor=Muscle cramps   Oxycodone Other (See Comments)    Severe constipation     Review of Systems:   General:  Denies fever, chills Optho/Auditory:   Denies visual changes, blurred vision Respiratory:   Denies SOB, cough, wheeze, DIB  Cardiovascular:   Denies chest pain, palpitations, painful respirations Gastrointestinal:   Denies nausea, vomiting, diarrhea.  Endocrine:     Denies new hot or cold intolerance Musculoskeletal:  Denies joint swelling, gait issues, or new unexplained myalgias/ arthralgias Skin:  Denies rash, suspicious lesions  Neurological:    Denies dizziness, unexplained weakness, numbness  Psychiatric/Behavioral:   Denies mood changes    Objective:     There were no vitals taken for this visit.  There is no height or weight on file to calculate BMI.  General: Well Developed, well nourished, and in no acute distress.   HEENT: Normocephalic, atraumatic, pupils equal round reactive to light, neck supple, No carotid bruits, no JVD Skin: Warm and dry, cap RF less 2 sec Cardiac: Regular rate and rhythm, S1, S2 WNL's, no murmurs rubs or gallops Respiratory: ECTA B/L, Not using accessory muscles, speaking in full sentences. NeuroM-Sk: Ambulates w/o assistance, moves ext * 4 w/o difficulty, sensation grossly intact.  Ext: scant edema b/l lower ext Psych: No HI/SI, judgement and insight good, Euthymic mood. Full Affect.

## 2016-09-16 LAB — CBC WITH DIFFERENTIAL/PLATELET
Basophils Absolute: 0 10*3/uL (ref 0.0–0.2)
Basos: 0 %
EOS (ABSOLUTE): 0.2 10*3/uL (ref 0.0–0.4)
Eos: 3 %
Hematocrit: 38.4 % (ref 37.5–51.0)
Hemoglobin: 13.1 g/dL (ref 13.0–17.7)
Immature Grans (Abs): 0 10*3/uL (ref 0.0–0.1)
Immature Granulocytes: 1 %
Lymphocytes Absolute: 1.4 10*3/uL (ref 0.7–3.1)
Lymphs: 28 %
MCH: 30.3 pg (ref 26.6–33.0)
MCHC: 34.1 g/dL (ref 31.5–35.7)
MCV: 89 fL (ref 79–97)
Monocytes Absolute: 0.6 10*3/uL (ref 0.1–0.9)
Monocytes: 11 %
Neutrophils Absolute: 2.8 10*3/uL (ref 1.4–7.0)
Neutrophils: 57 %
Platelets: 263 10*3/uL (ref 150–379)
RBC: 4.32 x10E6/uL (ref 4.14–5.80)
RDW: 12.9 % (ref 12.3–15.4)
WBC: 4.9 10*3/uL (ref 3.4–10.8)

## 2016-09-16 LAB — LIPID PANEL
Chol/HDL Ratio: 3.9 ratio (ref 0.0–5.0)
Cholesterol, Total: 167 mg/dL (ref 100–199)
HDL: 43 mg/dL (ref >39)
LDL Calculated: 98 mg/dL (ref 0–99)
Triglycerides: 130 mg/dL (ref 0–149)
VLDL Cholesterol Cal: 26 mg/dL (ref 5–40)

## 2016-09-16 LAB — VITAMIN D 25 HYDROXY (VIT D DEFICIENCY, FRACTURES): Vit D, 25-Hydroxy: 43.1 ng/mL (ref 30.0–100.0)

## 2016-09-16 LAB — COMPREHENSIVE METABOLIC PANEL
ALT: 14 IU/L (ref 0–44)
AST: 16 IU/L (ref 0–40)
Albumin/Globulin Ratio: 1.6 (ref 1.2–2.2)
Albumin: 4.1 g/dL (ref 3.5–4.7)
Alkaline Phosphatase: 68 IU/L (ref 39–117)
BUN/Creatinine Ratio: 14 (ref 10–24)
BUN: 15 mg/dL (ref 8–27)
Bilirubin Total: 0.5 mg/dL (ref 0.0–1.2)
CO2: 26 mmol/L (ref 20–29)
Calcium: 9.4 mg/dL (ref 8.6–10.2)
Chloride: 94 mmol/L — ABNORMAL LOW (ref 96–106)
Creatinine, Ser: 1.06 mg/dL (ref 0.76–1.27)
GFR calc Af Amer: 76 mL/min/{1.73_m2} (ref >59)
GFR calc non Af Amer: 65 mL/min/{1.73_m2} (ref >59)
Globulin, Total: 2.5 g/dL (ref 1.5–4.5)
Glucose: 101 mg/dL — ABNORMAL HIGH (ref 65–99)
Potassium: 5.4 mmol/L — ABNORMAL HIGH (ref 3.5–5.2)
Sodium: 133 mmol/L — ABNORMAL LOW (ref 134–144)
Total Protein: 6.6 g/dL (ref 6.0–8.5)

## 2016-09-16 LAB — VITAMIN B12: Vitamin B-12: 177 pg/mL — ABNORMAL LOW (ref 232–1245)

## 2016-09-16 LAB — HEMOGLOBIN A1C
Est. average glucose Bld gHb Est-mCnc: 137 mg/dL
Hgb A1c MFr Bld: 6.4 % — ABNORMAL HIGH (ref 4.8–5.6)

## 2016-09-16 LAB — TSH: TSH: 5.86 u[IU]/mL — ABNORMAL HIGH (ref 0.450–4.500)

## 2016-09-16 LAB — T4, FREE: Free T4: 1.79 ng/dL — ABNORMAL HIGH (ref 0.82–1.77)

## 2016-09-22 ENCOUNTER — Telehealth: Payer: Self-pay | Admitting: Family Medicine

## 2016-09-22 DIAGNOSIS — Z79890 Hormone replacement therapy (postmenopausal): Secondary | ICD-10-CM

## 2016-09-22 DIAGNOSIS — R7989 Other specified abnormal findings of blood chemistry: Secondary | ICD-10-CM

## 2016-09-22 DIAGNOSIS — R5382 Chronic fatigue, unspecified: Secondary | ICD-10-CM

## 2016-09-23 ENCOUNTER — Telehealth: Payer: Self-pay

## 2016-09-23 NOTE — Telephone Encounter (Signed)
Spoke with patient regarding his lab results.  He understands and looks forward to his endocrinology appointment.

## 2016-09-23 NOTE — Telephone Encounter (Signed)
-----   Message from Mellody Dance, DO sent at 09/22/2016  6:13 PM EDT ----- Please let patient know that his thyroid function has some abnormalities which are puzzling to me.  I have suspect that he may have a condition called thyroid hormone resistance, and at this time it is best that he go to an endocrinologist for further evaluation and treatment of his thyroid abnormality.   A1c is borderline at 6.4 and I recommend he work on prudent diet and moving around more to help improve his sugar levels  B12 level is too low and I recommend he increase his supplementation to twice of what he is currently taking.  Please inform pt that oral administration of high-dose vitamin B12 (1 to 2 mg daily) is as effective as intramuscular administration in correcting the deficiency, regardless of etiology.  Please tell him the rest of his labs look good\within normal limits and he should follow-up as we previously discussed last office visit.

## 2016-10-06 DIAGNOSIS — M7061 Trochanteric bursitis, right hip: Secondary | ICD-10-CM | POA: Diagnosis not present

## 2016-10-11 NOTE — Telephone Encounter (Signed)
Entered in eror

## 2016-10-12 DIAGNOSIS — M25551 Pain in right hip: Secondary | ICD-10-CM | POA: Diagnosis not present

## 2016-10-15 DIAGNOSIS — M25551 Pain in right hip: Secondary | ICD-10-CM | POA: Diagnosis not present

## 2016-10-20 DIAGNOSIS — M1711 Unilateral primary osteoarthritis, right knee: Secondary | ICD-10-CM | POA: Diagnosis not present

## 2016-10-20 DIAGNOSIS — Z9889 Other specified postprocedural states: Secondary | ICD-10-CM | POA: Diagnosis not present

## 2016-10-20 DIAGNOSIS — M25551 Pain in right hip: Secondary | ICD-10-CM | POA: Diagnosis not present

## 2016-10-22 DIAGNOSIS — M25551 Pain in right hip: Secondary | ICD-10-CM | POA: Diagnosis not present

## 2016-11-09 DIAGNOSIS — M1711 Unilateral primary osteoarthritis, right knee: Secondary | ICD-10-CM | POA: Diagnosis not present

## 2016-11-09 DIAGNOSIS — G8929 Other chronic pain: Secondary | ICD-10-CM | POA: Diagnosis not present

## 2016-11-10 ENCOUNTER — Other Ambulatory Visit: Payer: Self-pay | Admitting: Rheumatology

## 2016-11-10 NOTE — Progress Notes (Signed)
Patient ID: Kyle Simon, male   DOB: 21-Feb-1933, 83 y.o.   MRN: [id number]           Referring Physician: Sharee Holster  Reason for Appointment:  Hypothyroidism, new visit    History of Present Illness:   Hypothyroidism was first diagnosed  10-15 years ago   At the time of diagnosis patient was apparently not having symptoms of  fatigue, cold sensitivity, difficulty concentrating, dry skin, weight gain He thinks his primary care physician may have done routine testing and told him that he was hypothyroid and started him on unknown dose of levothyroxine He did not start feeling any different when he started taking levothyroxine initially Also usually does not feel any different with dosage adjustments that have been made  Patient has been seeing a new primary care physician over the last few years Apparently his dose was 150 g for some time but this was reduced in 06/2015 when his TSH was low normal Since then he has been taking 150 g 5 days a week and 125 g twice a week  The patient does think after retrospection that since spring time his energy level has been not as good and he has not been able to do as much with activities as he used to and he blamed this on getting older Also his tending to gain weight although his less active because of knee pain He does not think he has any cold intolerance  He has been referred here because his TSH is tending to be high but also his free T4 was relatively higher in 7/18  He takes his levothyroxine at bedtime which he has been doing for some time, however he is eating his evening meal about 2-3 hours before this He takes calcium at breakfast         Patient's weight history is as follows:  Wt Readings from Last 3 Encounters:  11/11/16 201 lb 3.2 oz (91.3 kg)  11/11/16 199 lb (90.3 kg)  07/29/16 197 lb (89.4 kg)    Thyroid function results have been as follows:  Lab Results  Component Value Date   TSH 5.860 (H) 09/15/2016   TSH 5.070  (H) 06/22/2016   TSH 2.19 02/04/2016   TSH 4.31 12/16/2015   FREET4 1.79 (H) 09/15/2016   FREET4 1.47 06/22/2016   FREET4 1.6 02/04/2016   FREET4 1.5 10/17/2015   T3FREE 2.7 10/17/2015    Lab Results  Component Value Date   TSH 5.860 (H) 09/15/2016   TSH 5.070 (H) 06/22/2016   TSH 2.19 02/04/2016   TSH 4.31 12/16/2015   TSH 3.45 10/17/2015   TSH 0.66 07/29/2015   TSH 0.859 03/21/2015   TSH 1.33 03/20/2014   TSH 0.59 01/09/2013     Past Medical History:  Diagnosis Date  . Arthritis    BOTH KNEES and hands;Dr.Deveshwar  . Constipation due to pain medication   . Enlarged prostate   . Frequent urination at night   . GERD (gastroesophageal reflux disease)   . Hyperlipidemia   . Hypothyroidism   . Polio    as a child w/o complications  . Snoring    no sleep apnea  . Torn meniscus   . Transfusion history 2012   post TKR    Past Surgical History:  Procedure Laterality Date  . BACK SURGERY  2011   hemiarthroplasty 01/11/1999 and 6  . CARDIAC CATHETERIZATION  09/2001   negative  . EYE SURGERY  2003   R&L cataract removed &  IOL- 2003 & 2007,Dr.Jenkins  . INGUINAL HERNIA REPAIR  2002   right, Dr. Luan Pulling  . JOINT REPLACEMENT  2012   left knee replacement  . LIPOMA EXCISION     back  . MENISCECTOMY Bilateral    Dr. Thurston Hole  . PROSTATE SURGERY  07/2006   for urinary urgency, Dr. Isabel Caprice  . ROTATOR CUFF REPAIR Right October, 2016  . sinus & throat surgery-1995  1995  . TONSILLECTOMY    . TOTAL KNEE ARTHROPLASTY  01/11/2011   Procedure: TOTAL KNEE ARTHROPLASTY;  Surgeon: Raymon Mutton, MD;  Location: [facility] OR;  Service: Orthopedics;  Laterality: Left;  . UVULECTOMY  1996   Dr. Lazarus Salines  . VASECTOMY    . WRIST SURGERY  1959   fracture- right    Family History  Problem Relation Age of Onset  . Sudden death Father        MVA  . Coronary artery disease Mother   . Emphysema Mother   . Diverticulosis Mother   . Asthma Brother   . Bone cancer Brother   .  Diabetes Paternal Grandmother   . Sarcoidosis Son   . Anesthesia problems Neg Hx   . Hypotension Neg Hx   . Malignant hyperthermia Neg Hx   . Pseudochol deficiency Neg Hx   . Colon cancer Neg Hx     Social History:  reports that he quit smoking about 50 years ago. He has a 18.00 pack-year smoking history. He has never used smokeless tobacco. He reports that he drinks about 2.4 oz of alcohol per week . He reports that he does not use drugs.  Allergies:  Allergies  Allergen Reactions  . Simvastatin Other (See Comments)    Zocor=Muscle cramps  . Oxycodone Other (See Comments)    Severe constipation    Allergies as of 11/11/2016      Reactions   Simvastatin Other (See Comments)   Zocor=Muscle cramps   Oxycodone Other (See Comments)   Severe constipation      Medication List       Accurate as of 11/11/16  2:05 PM. Always use your most recent med list.          acetaminophen 500 MG tablet Commonly known as:  TYLENOL Take 500 mg by mouth every 8 (eight) hours as needed for mild pain.   ARTIFICIAL TEARS OP Place 1 drop into both eyes 2 (two) times daily.   aspirin EC 81 MG tablet Take 81 mg by mouth daily.   CALCIUM 600 PO Take 1 tablet by mouth daily.   CALCIUM-MAGNESIUM-ZINC PO Take 1 tablet by mouth daily.   celecoxib 200 MG capsule Commonly known as:  CELEBREX TAKE 1 CAPSULE DAILY   FISH OIL PO Take 1 capsule by mouth daily.   FLOMAX 0.4 MG Caps capsule Generic drug:  tamsulosin Take 0.4 mg by mouth 2 (two) times daily.   glucose blood test strip Commonly known as:  PRECISION XTRA TEST STRIPS USE TO CHECK BLOOD SUGAR ONCE DAILY DX E11.9   ibuprofen 600 MG tablet Commonly known as:  ADVIL,MOTRIN Take 600 mg by mouth as needed.   LEVOXYL 125 MCG tablet Generic drug:  levothyroxine Tuesday, Thursday, Saturday, Sunday   levothyroxine 150 MCG tablet Commonly known as:  SYNTHROID, LEVOTHROID Take 1 tablet Monday, Wednesday and Friday morning     losartan 50 MG tablet Commonly known as:  COZAAR TAKE ONE-HALF (1/2) TABLET EVERY OTHER DAY   multivitamin with minerals Tabs tablet Take 1 tablet  by mouth daily.   NONFORMULARY OR COMPOUNDED ITEM Glucometer (Precision XTRA- Model #XCC [id number])   RESTORA Caps One tab qd          Review of Systems  Constitutional: Positive for weight gain.  HENT: Negative for trouble swallowing.   Respiratory: Negative for shortness of breath.   Gastrointestinal: Positive for diarrhea.       Periodically has loose stools, better with probiotic  Endocrine: Negative for cold intolerance and light-headedness.  Genitourinary: Positive for nocturia.  Musculoskeletal: Positive for joint pain.  Skin: Negative for dry skin.  Neurological: Negative for numbness.                Examination:    BP (!) 142/76   Pulse 65   Ht 5\' 11"  (1.803 m)   Wt 201 lb 3.2 oz (91.3 kg)   SpO2 97%   BMI 28.06 kg/m   GENERAL:  Average build.   No pallor, clubbing,Cervical lymphadenopathy or peripheral edema.    Skin:  no rash or pigmentation.  EYES:  No prominence of the eyes or swelling of the eyelids  ENT: Oral mucosa and tongue normal.  THYROID:  Not palpable.  HEART:  Normal  S1 and S2; no murmur or click.  CHEST:  Mild gynecomastia bilaterally.  Lungs: Vescicular breath sounds heard equally.  No crepitations/ wheeze.  ABDOMEN:  No distention.  Liver and spleen not palpable.  No other mass or tenderness.  NEUROLOGICAL: Reflexes are difficult to elicit both at biceps and ankles  JOINTS:  NormalFinger and ankle joints.   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  Free T4 may be falsely high related to lab error and also to his variable doses of levothyroxine during the week  He may well be symptomatic from mild hypothyroidism with his TSH trending higher and nearly 6 with more fatigue Even though his TSH target may be relatively higher considering his age he has previously done well with his TSH levels around 2 without side effects Currently taking the equivalent of about 142 g of levothyroxine on an average with taking 2 different doses of levothyroxine  PLAN:   Since he had previously tolerated 150 g daily dose he can go back to taking this daily However he needs to take this before breakfast without any food or medications and take his calcium supplement either at lunch or dinner This will improve absorption of his levothyroxine and hopefully given more consistent results also He will be reassessed in 6 weeks Will check his labs including free T4 and free T3 from a different lab on the next visit  Follow-up in 6 weeks   Kyle Simon 11/11/2016, 2:05 PM   Consultation note copy sent to the PCP  Note: This office note was prepared with Dragon voice recognition system technology. Any transcriptional errors that result from this process are unintentional.

## 2016-11-11 ENCOUNTER — Encounter: Payer: Self-pay | Admitting: Endocrinology

## 2016-11-11 ENCOUNTER — Ambulatory Visit: Payer: Medicare Other

## 2016-11-11 ENCOUNTER — Ambulatory Visit (INDEPENDENT_AMBULATORY_CARE_PROVIDER_SITE_OTHER): Payer: Medicare Other | Admitting: Endocrinology

## 2016-11-11 VITALS — BP 138/78 | HR 64 | Ht 71.0 in | Wt 199.0 lb

## 2016-11-11 VITALS — BP 142/76 | HR 65 | Ht 71.0 in | Wt 201.2 lb

## 2016-11-11 DIAGNOSIS — E063 Autoimmune thyroiditis: Secondary | ICD-10-CM

## 2016-11-11 DIAGNOSIS — I10 Essential (primary) hypertension: Secondary | ICD-10-CM

## 2016-11-11 NOTE — Progress Notes (Unsigned)
HPI Patient came into this office today for vitals only.  MPulliam, CMA/RT(R)

## 2016-11-11 NOTE — Telephone Encounter (Signed)
Last Visit: 07/29/16 Next Visit: 01/28/17 Labs: 09/15/16 cbc wnl cmp shows potassium 5.4 labs drawn by pcp   Okay to refill per Dr. Estanislado Pandy

## 2016-11-11 NOTE — Patient Instructions (Signed)
Take 0.15mg  daily 30 min before Bfst daily

## 2016-11-12 ENCOUNTER — Other Ambulatory Visit: Payer: Self-pay | Admitting: Orthopedic Surgery

## 2016-11-15 DIAGNOSIS — L603 Nail dystrophy: Secondary | ICD-10-CM | POA: Diagnosis not present

## 2016-11-15 DIAGNOSIS — I739 Peripheral vascular disease, unspecified: Secondary | ICD-10-CM | POA: Diagnosis not present

## 2016-11-15 DIAGNOSIS — L84 Corns and callosities: Secondary | ICD-10-CM | POA: Diagnosis not present

## 2016-11-15 NOTE — Pre-Procedure Instructions (Signed)
K[facility]  11/15/2016      [facility] - [place], [place] - [address] [place] [address] Phone: [phone] Fax: [phone]  [place], [place] - [address] [place] [address] Phone: [phone] Fax: [phone]    Your procedure is scheduled on Monday, September, 24, 2018  Report to [facility] Admitting Entrance"A" at 5:30 A.M.   Call this number if you have problems the morning of surgery:  [phone]   Remember:  Do not eat food or drink liquids after midnight.  Take these medicines the morning of surgery with A SIP OF WATER: Levothyroxine (SYNTHROID, LEVOTHROID), Tamsulosin HCl (FLOMAX), and Hypromellose (ARTIFICIAL TEARS OP).  7 days before surgery stop taking all Aspirins, Vitamins, Fish oils, and Herbal medications. Also stop all NSAIDS i.e. Advil, Motrin, Aleve, Anaprox, Naproxen, BC and Goody Powders.  How to Manage Your Diabetes Before and After Surgery  Why is it important to control my blood sugar before and after surgery? . Improving blood sugar levels before and after surgery helps healing and can limit problems. . A way of improving blood sugar control is eating a healthy diet by: o  Eating less sugar and carbohydrates o  Increasing activity/exercise o  Talking with your doctor about reaching your blood sugar goals . High blood sugars (greater than 180 mg/dL) can raise your risk of infections and slow your recovery, so you will need to focus on controlling your diabetes during the weeks before surgery. . Make sure that the doctor who takes care of your diabetes knows about your planned surgery including the date and location.  How do I manage my blood sugar before surgery? . Check your blood sugar at least 4 times a day, starting 2 days before surgery, to make sure that the level is not too high or low. o Check your  blood sugar the morning of your surgery when you wake up and every 2 hours until you get to the Short Stay unit. . If your blood sugar is less than 70 mg/dL, you will need to treat for low blood sugar: o Do not take insulin. o Treat a low blood sugar (less than 70 mg/dL) with  cup of clear juice (cranberry or apple), 4 glucose tablets, OR glucose gel. o Recheck blood sugar in 15 minutes after treatment (to make sure it is greater than 70 mg/dL). If your blood sugar is not greater than 70 mg/dL on recheck, call [phone] for further instructions. . Report your blood sugar to the short stay nurse when you get to Short Stay.  . If you are admitted to the [facility] after surgery: o Your blood sugar will be checked by the staff and you will probably be given insulin after surgery (instead of oral diabetes medicines) to make sure you have good blood sugar levels. o The goal for blood sugar control after surgery is 80-180 mg/dL.   Do not wear jewelry.  Do not wear lotions, powders, or perfumes, or deodorant.  Do not shave 48 hours prior to surgery.  Men may shave face and neck.  Do not bring valuables to the [facility].  [facility] is not responsible for any belongings or valuables.  Contacts, dentures or bridgework may not be worn into surgery.  Leave your suitcase in the car.  After surgery it may be brought to your room.  For patients  admitted to the [facility], discharge time will be determined by your treatment team.  Patients discharged the day of surgery will not be allowed to drive home.   Special instructions:   [place]- Preparing For Surgery  Before surgery, you can play an important role. Because skin is not sterile, your skin needs to be as free of germs as possible. You can reduce the number of germs on your skin by washing with CHG (chlorahexidine gluconate) Soap before surgery.  CHG is an antiseptic cleaner which kills germs and bonds with the skin to continue killing germs even  after washing.  Please do not use if you have an allergy to CHG or antibacterial soaps. If your skin becomes reddened/irritated stop using the CHG.  Do not shave (including legs and underarms) for at least 48 hours prior to first CHG shower. It is OK to shave your face.  Please follow these instructions carefully.   1. Shower the NIGHT BEFORE SURGERY and the MORNING OF SURGERY with CHG.   2. If you chose to wash your hair, wash your hair first as usual with your normal shampoo.  3. After you shampoo, rinse your hair and body thoroughly to remove the shampoo.  4. Use CHG as you would any other liquid soap. You can apply CHG directly to the skin and wash gently with a scrungie or a clean washcloth.   5. Apply the CHG Soap to your body ONLY FROM THE NECK DOWN.  Do not use on open wounds or open sores. Avoid contact with your eyes, ears, mouth and genitals (private parts). Wash genitals (private parts) with your normal soap.  6. Wash thoroughly, paying special attention to the area where your surgery will be performed.  7. Thoroughly rinse your body with warm water from the neck down.  8. DO NOT shower/wash with your normal soap after using and rinsing off the CHG Soap.  9. Pat yourself dry with a CLEAN TOWEL.   10. Wear CLEAN PAJAMAS   11. Place CLEAN SHEETS on your bed the night of your first shower and DO NOT SLEEP WITH PETS. 12.  Day of Surgery: Do not apply any deodorants/lotions. Please wear clean clothes to the [facility]/surgery center.    Please read over the following fact sheets that you were given. Pain Booklet, Coughing and Deep Breathing, Total Joint Packet, MRSA Information and Surgical Site Infection Prevention

## 2016-11-16 ENCOUNTER — Encounter (HOSPITAL_COMMUNITY): Payer: Self-pay

## 2016-11-16 ENCOUNTER — Encounter (HOSPITAL_COMMUNITY)
Admission: RE | Admit: 2016-11-16 | Discharge: 2016-11-16 | Disposition: A | Discharge status: Home / Self Care | Payer: Medicare Other | Source: Ambulatory Visit | Attending: Orthopedic Surgery | Admitting: Orthopedic Surgery

## 2016-11-16 DIAGNOSIS — Z7982 Long term (current) use of aspirin: Secondary | ICD-10-CM | POA: Diagnosis not present

## 2016-11-16 DIAGNOSIS — Z01810 Encounter for preprocedural cardiovascular examination: Secondary | ICD-10-CM | POA: Diagnosis not present

## 2016-11-16 DIAGNOSIS — Z87891 Personal history of nicotine dependence: Secondary | ICD-10-CM | POA: Diagnosis not present

## 2016-11-16 DIAGNOSIS — K219 Gastro-esophageal reflux disease without esophagitis: Secondary | ICD-10-CM | POA: Insufficient documentation

## 2016-11-16 DIAGNOSIS — Z79899 Other long term (current) drug therapy: Secondary | ICD-10-CM | POA: Diagnosis not present

## 2016-11-16 DIAGNOSIS — I10 Essential (primary) hypertension: Secondary | ICD-10-CM | POA: Insufficient documentation

## 2016-11-16 DIAGNOSIS — E039 Hypothyroidism, unspecified: Secondary | ICD-10-CM | POA: Insufficient documentation

## 2016-11-16 DIAGNOSIS — Z01812 Encounter for preprocedural laboratory examination: Secondary | ICD-10-CM | POA: Insufficient documentation

## 2016-11-16 DIAGNOSIS — E785 Hyperlipidemia, unspecified: Secondary | ICD-10-CM | POA: Insufficient documentation

## 2016-11-16 DIAGNOSIS — E119 Type 2 diabetes mellitus without complications: Secondary | ICD-10-CM | POA: Insufficient documentation

## 2016-11-16 HISTORY — DX: Type 2 diabetes mellitus without complications: E11.9

## 2016-11-16 LAB — COMPREHENSIVE METABOLIC PANEL
ALT: 18 U/L (ref 17–63)
AST: 19 U/L (ref 15–41)
Albumin: 3.8 g/dL (ref 3.5–5.0)
Alkaline Phosphatase: 64 U/L (ref 38–126)
Anion gap: 7 (ref 5–15)
BUN: 14 mg/dL (ref 6–20)
CO2: 22 mmol/L (ref 22–32)
Calcium: 9.3 mg/dL (ref 8.9–10.3)
Chloride: 100 mmol/L — ABNORMAL LOW (ref 101–111)
Creatinine, Ser: 0.99 mg/dL (ref 0.61–1.24)
GFR calc Af Amer: >60 mL/min (ref >60)
GFR calc non Af Amer: >60 mL/min (ref >60)
Glucose, Bld: 115 mg/dL — ABNORMAL HIGH (ref 65–99)
Potassium: 4.6 mmol/L (ref 3.5–5.1)
Sodium: 129 mmol/L — ABNORMAL LOW (ref 135–145)
Total Bilirubin: 0.6 mg/dL (ref 0.3–1.2)
Total Protein: 6.9 g/dL (ref 6.5–8.1)

## 2016-11-16 LAB — GLUCOSE, CAPILLARY: Glucose-Capillary: 131 mg/dL — ABNORMAL HIGH (ref 65–99)

## 2016-11-16 LAB — CBC WITH DIFFERENTIAL/PLATELET
Basophils Absolute: 0 10*3/uL (ref 0.0–0.1)
Basophils Relative: 0 %
Eosinophils Absolute: 0.1 10*3/uL (ref 0.0–0.7)
Eosinophils Relative: 2 %
HCT: 37.1 % — ABNORMAL LOW (ref 39.0–52.0)
Hemoglobin: 12.5 g/dL — ABNORMAL LOW (ref 13.0–17.0)
Lymphocytes Relative: 27 %
Lymphs Abs: 1.4 10*3/uL (ref 0.7–4.0)
MCH: 30.4 pg (ref 26.0–34.0)
MCHC: 33.7 g/dL (ref 30.0–36.0)
MCV: 90.3 fL (ref 78.0–100.0)
Monocytes Absolute: 0.7 10*3/uL (ref 0.1–1.0)
Monocytes Relative: 14 %
Neutro Abs: 2.9 10*3/uL (ref 1.7–7.7)
Neutrophils Relative %: 57 %
Platelets: 216 10*3/uL (ref 150–400)
RBC: 4.11 MIL/uL — ABNORMAL LOW (ref 4.22–5.81)
RDW: 12.6 % (ref 11.5–15.5)
WBC: 5.1 10*3/uL (ref 4.0–10.5)

## 2016-11-16 LAB — SURGICAL PCR SCREEN
MRSA, PCR: NEGATIVE
Staphylococcus aureus: NEGATIVE

## 2016-11-16 NOTE — Progress Notes (Signed)
PCP - Dr. Denyce Derian  Cardiologist - Denies  Chest x-ray - Denies  EKG - 11/16/16  Stress Test - 08/20/10 (E)  ECHO - 08/20/10 (E)  Cardiac Cath - 2003 (Neg)  Sleep Study - No CPAP - None  Fasting Blood Sugar - today 131 Checks Blood Sugar ___1__ times a day Pt controls the BG with diet and weight.  Pt denies having chest pain, sob, or fever at this time. All instructions explained to the pt, with a verbal understanding of the material. Pt agrees to go over the instructions while at home for a better understanding. The opportunity to ask questions was provided.

## 2016-11-16 NOTE — Progress Notes (Signed)
Chart will be given to anesthesia for further review due to an abnormal Na+ 129. Levada Dy PA for anesthesia made aware.

## 2016-11-17 NOTE — Progress Notes (Signed)
Anesthesia Chart Review:  Pt is an 83 year old male scheduled for R total knee arthroplasty on 11/22/2016 with Vickey Huger, MD  - PCP is Mellody Dance, DO  PMH includes:  HTN, DM, hyperlipidemia, hypothyroidism, GERD. Former smoker. BMI 28. S/p PLIF 10/23/13. S/p lumbar laminectomy 10/07/11. S/p L TKA 01/11/11  Medications include: ASA 81mg , levothyroxine, losartan, potassium  BP (!) 170/76   Pulse (!) 51   Temp 36.5 C   Resp 18   Ht 5' 10.5" (1.791 m)   Wt 197 lb 9.6 oz (89.6 kg)   SpO2 99%   BMI 27.95 kg/m   Preoperative labs reviewed.   - Na 129, Cl 100. Prior labs show Na ranges 131-136 over last several years, usually around 133. I spoke to pt by telephone; he will increase Na intake via foods like celery.  Will recheck Na DOS.    EKG 11/16/16: Sinus bradycardia (50 bpm)  Stress echo 08/20/10:  - Stress ECG conclusions: Significant artifact in ECG tracing withexercise. 59mm ST segment depression in inferolateral leads at peakexercise - Impressions: Low risk stress echo. Abnormal ECG response. Normalimages but stress images seem to have been captured at relativelylow HR which decreases sensitivity of test - Impressions: Low risk stress echo. Abnormal ECG response. Normal images butstress images seem to have been captured at relatively low HRwhich decreases sensitivity of test  If labs acceptable DOS, I anticipate pt can proceed with surgery as scheduled.   Willeen Cass, FNP-BC [facility] Short Stay Surgical Center/Anesthesiology Phone: [phone] 11/17/2016 12:38 PM

## 2016-11-19 MED ORDER — SODIUM CHLORIDE 0.9 % IV SOLN
1000.0000 mg | INTRAVENOUS | Status: AC
  Administered 2016-11-22: 1000 mg via INTRAVENOUS
  Filled 2016-11-19: qty 1100

## 2016-11-19 MED ORDER — GABAPENTIN 300 MG PO CAPS
300.0000 mg | ORAL_CAPSULE | Freq: Once | ORAL | Status: AC
  Administered 2016-11-22: 300 mg via ORAL
  Filled 2016-11-19: qty 1

## 2016-11-19 MED ORDER — ACETAMINOPHEN 500 MG PO TABS
1000.0000 mg | ORAL_TABLET | Freq: Once | ORAL | Status: AC
  Administered 2016-11-22: 1000 mg via ORAL
  Filled 2016-11-19: qty 2

## 2016-11-19 MED ORDER — CEFAZOLIN SODIUM-DEXTROSE 2-4 GM/100ML-% IV SOLN
2.0000 g | INTRAVENOUS | Status: AC
  Administered 2016-11-22: 2 g via INTRAVENOUS
  Filled 2016-11-19: qty 100

## 2016-11-19 MED ORDER — BUPIVACAINE LIPOSOME 1.3 % IJ SUSP
20.0000 mL | INTRAMUSCULAR | Status: AC
  Administered 2016-11-22: 20 mL
  Filled 2016-11-19: qty 20

## 2016-11-19 MED ORDER — DEXAMETHASONE SODIUM PHOSPHATE 10 MG/ML IJ SOLN
8.0000 mg | Freq: Once | INTRAMUSCULAR | Status: AC
  Administered 2016-11-22: 8 mg via INTRAVENOUS
  Filled 2016-11-19: qty 1

## 2016-11-21 NOTE — Anesthesia Preprocedure Evaluation (Addendum)
Anesthesia Evaluation  Patient identified by MRN, date of birth, ID band Patient awake    Reviewed: Allergy & Precautions, H&P , NPO status , Patient's Chart, lab work & pertinent test results  History of Anesthesia Complications Negative for: history of anesthetic complications  Airway Mallampati: I  TM Distance: >3 FB Neck ROM: Full    Dental  (+) Teeth Intact, Implants, Caps, Dental Advisory Given   Pulmonary former smoker,    Pulmonary exam normal        Cardiovascular hypertension, Pt. on medications Normal cardiovascular exam     Neuro/Psych negative neurological ROS  negative psych ROS   GI/Hepatic Neg liver ROS, GERD  Medicated,  Endo/Other  diabetes, Type 2Hypothyroidism   Renal/GU negative Renal ROS  negative genitourinary   Musculoskeletal negative musculoskeletal ROS (+) Arthritis , Osteoarthritis,    Abdominal   Peds negative pediatric ROS (+)  Hematology negative hematology ROS (+) anemia ,   Anesthesia Other Findings Lumbar intraoperative c-arm review from 2013 images shows L4 -L5 pedicle screws  Reproductive/Obstetrics negative OB ROS                           Anesthesia Physical  Anesthesia Plan  ASA: III  Anesthesia Plan: Spinal   Post-op Pain Management:  Regional for Post-op pain   Induction:   PONV Risk Score and Plan: 2 and Ondansetron, Dexamethasone, Treatment may vary due to age or medical condition, Midazolam and Propofol infusion  Airway Management Planned: Nasal Cannula, Natural Airway, Simple Face Mask and LMA  Additional Equipment:   Intra-op Plan:   Post-operative Plan: Extubation in OR  Informed Consent: I have reviewed the patients History and Physical, chart, labs and discussed the procedure including the risks, benefits and alternatives for the proposed anesthesia with the patient or authorized representative who has indicated his/her  understanding and acceptance.   Dental advisory given  Plan Discussed with: CRNA, Anesthesiologist and Surgeon  Anesthesia Plan Comments: (  )       Anesthesia Quick Evaluation

## 2016-11-22 ENCOUNTER — Encounter (HOSPITAL_COMMUNITY): Payer: Self-pay | Admitting: *Deleted

## 2016-11-22 ENCOUNTER — Ambulatory Visit (HOSPITAL_COMMUNITY): Payer: Medicare Other | Admitting: Emergency Medicine

## 2016-11-22 ENCOUNTER — Encounter (HOSPITAL_COMMUNITY)
Admission: RE | Disposition: A | Discharge status: Home / Self Care | Payer: Self-pay | Source: Ambulatory Visit | Attending: Orthopedic Surgery

## 2016-11-22 ENCOUNTER — Observation Stay (HOSPITAL_COMMUNITY)
Admission: RE | Admit: 2016-11-22 | Discharge: 2016-11-23 | Disposition: A | Discharge status: Home / Self Care | Payer: Medicare Other | Source: Ambulatory Visit | Attending: Orthopedic Surgery | Admitting: Orthopedic Surgery

## 2016-11-22 DIAGNOSIS — Z9889 Other specified postprocedural states: Secondary | ICD-10-CM | POA: Insufficient documentation

## 2016-11-22 DIAGNOSIS — M810 Age-related osteoporosis without current pathological fracture: Secondary | ICD-10-CM | POA: Insufficient documentation

## 2016-11-22 DIAGNOSIS — Z96652 Presence of left artificial knee joint: Secondary | ICD-10-CM | POA: Insufficient documentation

## 2016-11-22 DIAGNOSIS — E039 Hypothyroidism, unspecified: Secondary | ICD-10-CM | POA: Diagnosis not present

## 2016-11-22 DIAGNOSIS — H6991 Unspecified Eustachian tube disorder, right ear: Secondary | ICD-10-CM | POA: Insufficient documentation

## 2016-11-22 DIAGNOSIS — J310 Chronic rhinitis: Secondary | ICD-10-CM | POA: Insufficient documentation

## 2016-11-22 DIAGNOSIS — Z79899 Other long term (current) drug therapy: Secondary | ICD-10-CM | POA: Diagnosis not present

## 2016-11-22 DIAGNOSIS — Z8379 Family history of other diseases of the digestive system: Secondary | ICD-10-CM | POA: Insufficient documentation

## 2016-11-22 DIAGNOSIS — Z7982 Long term (current) use of aspirin: Secondary | ICD-10-CM | POA: Insufficient documentation

## 2016-11-22 DIAGNOSIS — I10 Essential (primary) hypertension: Secondary | ICD-10-CM | POA: Diagnosis not present

## 2016-11-22 DIAGNOSIS — Z888 Allergy status to other drugs, medicaments and biological substances status: Secondary | ICD-10-CM | POA: Insufficient documentation

## 2016-11-22 DIAGNOSIS — N400 Enlarged prostate without lower urinary tract symptoms: Secondary | ICD-10-CM | POA: Insufficient documentation

## 2016-11-22 DIAGNOSIS — K219 Gastro-esophageal reflux disease without esophagitis: Secondary | ICD-10-CM | POA: Insufficient documentation

## 2016-11-22 DIAGNOSIS — Z808 Family history of malignant neoplasm of other organs or systems: Secondary | ICD-10-CM | POA: Insufficient documentation

## 2016-11-22 DIAGNOSIS — E785 Hyperlipidemia, unspecified: Secondary | ICD-10-CM | POA: Diagnosis not present

## 2016-11-22 DIAGNOSIS — D649 Anemia, unspecified: Secondary | ICD-10-CM | POA: Insufficient documentation

## 2016-11-22 DIAGNOSIS — D519 Vitamin B12 deficiency anemia, unspecified: Secondary | ICD-10-CM | POA: Diagnosis not present

## 2016-11-22 DIAGNOSIS — M719 Bursopathy, unspecified: Secondary | ICD-10-CM | POA: Insufficient documentation

## 2016-11-22 DIAGNOSIS — E119 Type 2 diabetes mellitus without complications: Secondary | ICD-10-CM | POA: Diagnosis not present

## 2016-11-22 DIAGNOSIS — R0683 Snoring: Secondary | ICD-10-CM | POA: Diagnosis not present

## 2016-11-22 DIAGNOSIS — Z8612 Personal history of poliomyelitis: Secondary | ICD-10-CM | POA: Diagnosis not present

## 2016-11-22 DIAGNOSIS — Z96653 Presence of artificial knee joint, bilateral: Secondary | ICD-10-CM

## 2016-11-22 DIAGNOSIS — Z981 Arthrodesis status: Secondary | ICD-10-CM | POA: Insufficient documentation

## 2016-11-22 DIAGNOSIS — Z8249 Family history of ischemic heart disease and other diseases of the circulatory system: Secondary | ICD-10-CM | POA: Insufficient documentation

## 2016-11-22 DIAGNOSIS — M1711 Unilateral primary osteoarthritis, right knee: Secondary | ICD-10-CM | POA: Diagnosis not present

## 2016-11-22 DIAGNOSIS — E871 Hypo-osmolality and hyponatremia: Secondary | ICD-10-CM | POA: Insufficient documentation

## 2016-11-22 DIAGNOSIS — H6120 Impacted cerumen, unspecified ear: Secondary | ICD-10-CM | POA: Diagnosis not present

## 2016-11-22 DIAGNOSIS — K5730 Diverticulosis of large intestine without perforation or abscess without bleeding: Secondary | ICD-10-CM | POA: Insufficient documentation

## 2016-11-22 DIAGNOSIS — Z833 Family history of diabetes mellitus: Secondary | ICD-10-CM | POA: Insufficient documentation

## 2016-11-22 DIAGNOSIS — G8918 Other acute postprocedural pain: Secondary | ICD-10-CM | POA: Diagnosis not present

## 2016-11-22 DIAGNOSIS — M4316 Spondylolisthesis, lumbar region: Secondary | ICD-10-CM | POA: Insufficient documentation

## 2016-11-22 DIAGNOSIS — Z825 Family history of asthma and other chronic lower respiratory diseases: Secondary | ICD-10-CM | POA: Insufficient documentation

## 2016-11-22 DIAGNOSIS — Z885 Allergy status to narcotic agent status: Secondary | ICD-10-CM | POA: Insufficient documentation

## 2016-11-22 DIAGNOSIS — Z8349 Family history of other endocrine, nutritional and metabolic diseases: Secondary | ICD-10-CM | POA: Insufficient documentation

## 2016-11-22 DIAGNOSIS — Z87891 Personal history of nicotine dependence: Secondary | ICD-10-CM | POA: Insufficient documentation

## 2016-11-22 DIAGNOSIS — R197 Diarrhea, unspecified: Secondary | ICD-10-CM | POA: Insufficient documentation

## 2016-11-22 HISTORY — PX: TOTAL KNEE ARTHROPLASTY: SHX125

## 2016-11-22 LAB — POCT I-STAT 4, (NA,K, GLUC, HGB,HCT)
Glucose, Bld: 107 mg/dL — ABNORMAL HIGH (ref 65–99)
HCT: 35 % — ABNORMAL LOW (ref 39.0–52.0)
Hemoglobin: 11.9 g/dL — ABNORMAL LOW (ref 13.0–17.0)
Potassium: 4.4 mmol/L (ref 3.5–5.1)
Sodium: 132 mmol/L — ABNORMAL LOW (ref 135–145)

## 2016-11-22 SURGERY — ARTHROPLASTY, KNEE, TOTAL
Anesthesia: Spinal | Laterality: Right

## 2016-11-22 MED ORDER — METHOCARBAMOL 500 MG PO TABS
500.0000 mg | ORAL_TABLET | Freq: Four times a day (QID) | ORAL | Status: DC | PRN
  Administered 2016-11-22 (×2): 500 mg via ORAL
  Filled 2016-11-22: qty 1

## 2016-11-22 MED ORDER — LOSARTAN POTASSIUM 25 MG PO TABS
25.0000 mg | ORAL_TABLET | Freq: Every day | ORAL | Status: DC
  Administered 2016-11-22 – 2016-11-23 (×2): 25 mg via ORAL
  Filled 2016-11-22 (×2): qty 1

## 2016-11-22 MED ORDER — BISACODYL 5 MG PO TBEC: 5.0000 mg | DELAYED_RELEASE_TABLET | Freq: Every day | ORAL | Status: DC | PRN

## 2016-11-22 MED ORDER — ACETAMINOPHEN 650 MG RE SUPP: 650.0000 mg | Freq: Four times a day (QID) | RECTAL | Status: DC | PRN

## 2016-11-22 MED ORDER — DIPHENHYDRAMINE HCL 12.5 MG/5ML PO ELIX: 12.5000 mg | ORAL_SOLUTION | ORAL | Status: DC | PRN

## 2016-11-22 MED ORDER — METHOCARBAMOL 1000 MG/10ML IJ SOLN
500.0000 mg | Freq: Four times a day (QID) | INTRAVENOUS | Status: DC | PRN
  Filled 2016-11-22: qty 5

## 2016-11-22 MED ORDER — PROPOFOL 500 MG/50ML IV EMUL
INTRAVENOUS | Status: DC | PRN
Start: 2016-11-22 — End: 2016-11-22
  Administered 2016-11-22: 50 ug/kg/min via INTRAVENOUS

## 2016-11-22 MED ORDER — CHLORHEXIDINE GLUCONATE 4 % EX LIQD: 60.0000 mL | Freq: Once | CUTANEOUS | Status: DC

## 2016-11-22 MED ORDER — ZOLPIDEM TARTRATE 5 MG PO TABS: 5.0000 mg | ORAL_TABLET | Freq: Every evening | ORAL | Status: DC | PRN

## 2016-11-22 MED ORDER — DEXAMETHASONE SODIUM PHOSPHATE 10 MG/ML IJ SOLN
10.0000 mg | Freq: Once | INTRAMUSCULAR | Status: AC
  Administered 2016-11-23: 10 mg via INTRAVENOUS
  Filled 2016-11-22: qty 1

## 2016-11-22 MED ORDER — HYDROMORPHONE HCL 1 MG/ML IJ SOLN: 1.0000 mg | INTRAMUSCULAR | Status: DC | PRN

## 2016-11-22 MED ORDER — ACETAMINOPHEN 325 MG PO TABS: 650.0000 mg | ORAL_TABLET | Freq: Four times a day (QID) | ORAL | Status: DC | PRN

## 2016-11-22 MED ORDER — ALUM & MAG HYDROXIDE-SIMETH 200-200-20 MG/5ML PO SUSP: 30.0000 mL | ORAL | Status: DC | PRN

## 2016-11-22 MED ORDER — ROPIVACAINE HCL 7.5 MG/ML IJ SOLN
INTRAMUSCULAR | Status: DC | PRN
  Administered 2016-11-22: 20 mL via PERINEURAL

## 2016-11-22 MED ORDER — LEVOTHYROXINE SODIUM 75 MCG PO TABS
150.0000 ug | ORAL_TABLET | Freq: Every day | ORAL | Status: DC
  Administered 2016-11-23: 150 ug via ORAL
  Filled 2016-11-22: qty 2

## 2016-11-22 MED ORDER — METOCLOPRAMIDE HCL 5 MG PO TABS: 5.0000 mg | ORAL_TABLET | Freq: Three times a day (TID) | ORAL | Status: DC | PRN

## 2016-11-22 MED ORDER — CEFAZOLIN SODIUM-DEXTROSE 1-4 GM/50ML-% IV SOLN
1.0000 g | Freq: Four times a day (QID) | INTRAVENOUS | Status: AC
  Administered 2016-11-22 (×2): 1 g via INTRAVENOUS
  Filled 2016-11-22 (×2): qty 50

## 2016-11-22 MED ORDER — MIDAZOLAM HCL 2 MG/2ML IJ SOLN
INTRAMUSCULAR | Status: AC
  Filled 2016-11-22: qty 2

## 2016-11-22 MED ORDER — BUPIVACAINE-EPINEPHRINE (PF) 0.25% -1:200000 IJ SOLN
INTRAMUSCULAR | Status: DC | PRN
  Administered 2016-11-22: 30 mL via PERINEURAL

## 2016-11-22 MED ORDER — TRAMADOL HCL 50 MG PO TABS
50.0000 mg | ORAL_TABLET | Freq: Four times a day (QID) | ORAL | Status: DC
  Administered 2016-11-22 – 2016-11-23 (×5): 100 mg via ORAL
  Filled 2016-11-22 (×5): qty 2

## 2016-11-22 MED ORDER — GABAPENTIN 300 MG PO CAPS
300.0000 mg | ORAL_CAPSULE | Freq: Three times a day (TID) | ORAL | Status: DC
  Administered 2016-11-22 – 2016-11-23 (×3): 300 mg via ORAL
  Filled 2016-11-22 (×3): qty 1

## 2016-11-22 MED ORDER — FENTANYL CITRATE (PF) 250 MCG/5ML IJ SOLN
INTRAMUSCULAR | Status: AC
Start: 2016-11-22 — End: 2016-11-22
  Filled 2016-11-22: qty 5

## 2016-11-22 MED ORDER — SENNOSIDES-DOCUSATE SODIUM 8.6-50 MG PO TABS: 1.0000 | ORAL_TABLET | Freq: Every evening | ORAL | Status: DC | PRN

## 2016-11-22 MED ORDER — TRANEXAMIC ACID 1000 MG/10ML IV SOLN
1000.0000 mg | Freq: Once | INTRAVENOUS | Status: AC
  Administered 2016-11-22: 1000 mg via INTRAVENOUS
  Filled 2016-11-22: qty 10

## 2016-11-22 MED ORDER — ONDANSETRON HCL 4 MG PO TABS: 4.0000 mg | ORAL_TABLET | Freq: Four times a day (QID) | ORAL | Status: DC | PRN

## 2016-11-22 MED ORDER — SODIUM CHLORIDE 0.9 % IJ SOLN
INTRAMUSCULAR | Status: DC | PRN
  Administered 2016-11-22: 20 mL via INTRAVENOUS

## 2016-11-22 MED ORDER — METOCLOPRAMIDE HCL 5 MG/ML IJ SOLN: 5.0000 mg | Freq: Three times a day (TID) | INTRAMUSCULAR | Status: DC | PRN

## 2016-11-22 MED ORDER — PHENYLEPHRINE HCL 10 MG/ML IJ SOLN
INTRAMUSCULAR | Status: DC | PRN
  Administered 2016-11-22 (×2): 80 ug via INTRAVENOUS

## 2016-11-22 MED ORDER — FLEET ENEMA 7-19 GM/118ML RE ENEM: 1.0000 | ENEMA | Freq: Once | RECTAL | Status: DC | PRN

## 2016-11-22 MED ORDER — FENTANYL CITRATE (PF) 100 MCG/2ML IJ SOLN: 25.0000 ug | INTRAMUSCULAR | Status: DC | PRN

## 2016-11-22 MED ORDER — DOCUSATE SODIUM 100 MG PO CAPS
100.0000 mg | ORAL_CAPSULE | Freq: Two times a day (BID) | ORAL | Status: DC
  Administered 2016-11-22 – 2016-11-23 (×3): 100 mg via ORAL
  Filled 2016-11-22 (×3): qty 1

## 2016-11-22 MED ORDER — BUPIVACAINE IN DEXTROSE 0.75-8.25 % IT SOLN
INTRATHECAL | Status: DC | PRN
  Administered 2016-11-22: 13 mg via INTRATHECAL

## 2016-11-22 MED ORDER — PHENOL 1.4 % MT LIQD: 1.0000 | OROMUCOSAL | Status: DC | PRN

## 2016-11-22 MED ORDER — BUPIVACAINE-EPINEPHRINE (PF) 0.25% -1:200000 IJ SOLN
INTRAMUSCULAR | Status: AC
  Filled 2016-11-22: qty 30

## 2016-11-22 MED ORDER — MEPERIDINE HCL 25 MG/ML IJ SOLN: 6.2500 mg | INTRAMUSCULAR | Status: DC | PRN

## 2016-11-22 MED ORDER — SODIUM CHLORIDE 0.9 % IR SOLN
Status: DC | PRN
  Administered 2016-11-22: 3000 mL

## 2016-11-22 MED ORDER — METHOCARBAMOL 500 MG PO TABS
ORAL_TABLET | ORAL | Status: AC
  Filled 2016-11-22: qty 1

## 2016-11-22 MED ORDER — HYDROCODONE-ACETAMINOPHEN 7.5-325 MG PO TABS
ORAL_TABLET | ORAL | Status: AC
  Administered 2016-11-22: 2
  Filled 2016-11-22: qty 2

## 2016-11-22 MED ORDER — MIDAZOLAM HCL 5 MG/5ML IJ SOLN
INTRAMUSCULAR | Status: DC | PRN
  Administered 2016-11-22 (×2): 1 mg via INTRAVENOUS

## 2016-11-22 MED ORDER — FENTANYL CITRATE (PF) 100 MCG/2ML IJ SOLN
INTRAMUSCULAR | Status: DC | PRN
  Administered 2016-11-22: 50 ug via INTRAVENOUS

## 2016-11-22 MED ORDER — TAMSULOSIN HCL 0.4 MG PO CAPS
0.4000 mg | ORAL_CAPSULE | Freq: Two times a day (BID) | ORAL | Status: DC
  Administered 2016-11-22 – 2016-11-23 (×2): 0.4 mg via ORAL
  Filled 2016-11-22 (×2): qty 1

## 2016-11-22 MED ORDER — LACTATED RINGERS IV SOLN
INTRAVENOUS | Status: DC | PRN
  Administered 2016-11-22: 07:00:00 via INTRAVENOUS

## 2016-11-22 MED ORDER — HYDROCODONE-ACETAMINOPHEN 10-325 MG PO TABS
1.0000 | ORAL_TABLET | ORAL | Status: DC | PRN
  Administered 2016-11-22 – 2016-11-23 (×3): 2 via ORAL
  Filled 2016-11-22 (×3): qty 2

## 2016-11-22 MED ORDER — ASPIRIN EC 325 MG PO TBEC
325.0000 mg | DELAYED_RELEASE_TABLET | Freq: Two times a day (BID) | ORAL | Status: DC
  Administered 2016-11-22 – 2016-11-23 (×2): 325 mg via ORAL
  Filled 2016-11-22 (×2): qty 1

## 2016-11-22 MED ORDER — ONDANSETRON HCL 4 MG/2ML IJ SOLN: 4.0000 mg | Freq: Four times a day (QID) | INTRAMUSCULAR | Status: DC | PRN

## 2016-11-22 MED ORDER — MENTHOL 3 MG MT LOZG: 1.0000 | LOZENGE | OROMUCOSAL | Status: DC | PRN

## 2016-11-22 SURGICAL SUPPLY — 61 items
BANDAGE ACE 6X5 VEL STRL LF (GAUZE/BANDAGES/DRESSINGS) ×2 IMPLANT
BANDAGE ELASTIC 6 VELCRO ST LF (GAUZE/BANDAGES/DRESSINGS) ×2 IMPLANT
BANDAGE ESMARK 6X9 LF (GAUZE/BANDAGES/DRESSINGS) ×1 IMPLANT
BLADE SAGITTAL 13X1.27X60 (BLADE) ×2 IMPLANT
BLADE SAW SGTL 83.5X18.5 (BLADE) ×2 IMPLANT
BLADE SURG 10 STRL SS (BLADE) ×2 IMPLANT
BNDG ESMARK 6X9 LF (GAUZE/BANDAGES/DRESSINGS) ×2
BOWL SMART MIX CTS (DISPOSABLE) ×2 IMPLANT
CAPT KNEE TOTAL 3 ×2 IMPLANT
CEMENT BONE SIMPLEX SPEEDSET (Cement) ×4 IMPLANT
COVER SURGICAL LIGHT HANDLE (MISCELLANEOUS) ×2 IMPLANT
CUFF TOURNIQUET SINGLE 34IN LL (TOURNIQUET CUFF) ×2 IMPLANT
DRAPE EXTREMITY T 121X128X90 (DRAPE) ×2 IMPLANT
DRAPE HALF SHEET 40X57 (DRAPES) IMPLANT
DRAPE INCISE IOBAN 66X45 STRL (DRAPES) ×4 IMPLANT
DRAPE U-SHAPE 47X51 STRL (DRAPES) ×2 IMPLANT
DRESSING AQUACEL AQ EXTRA 4X5 (GAUZE/BANDAGES/DRESSINGS) ×2 IMPLANT
DRSG AQUACEL AG ADV 3.5X10 (GAUZE/BANDAGES/DRESSINGS) ×2 IMPLANT
DURAPREP 26ML APPLICATOR (WOUND CARE) ×4 IMPLANT
ELECT REM PT RETURN 9FT ADLT (ELECTROSURGICAL) ×2
ELECTRODE REM PT RTRN 9FT ADLT (ELECTROSURGICAL) ×1 IMPLANT
FILTER STRAW FLUID ASPIR (MISCELLANEOUS) IMPLANT
GLOVE BIOGEL M 7.0 STRL (GLOVE) IMPLANT
GLOVE BIOGEL PI IND STRL 7.5 (GLOVE) IMPLANT
GLOVE BIOGEL PI IND STRL 8.5 (GLOVE) ×1 IMPLANT
GLOVE BIOGEL PI INDICATOR 7.5 (GLOVE)
GLOVE BIOGEL PI INDICATOR 8.5 (GLOVE) ×1
GLOVE SURG ORTHO 8.0 STRL STRW (GLOVE) ×4 IMPLANT
GOWN STRL REUS W/ TWL LRG LVL3 (GOWN DISPOSABLE) ×1 IMPLANT
GOWN STRL REUS W/ TWL XL LVL3 (GOWN DISPOSABLE) ×2 IMPLANT
GOWN STRL REUS W/TWL 2XL LVL3 (GOWN DISPOSABLE) ×2 IMPLANT
GOWN STRL REUS W/TWL LRG LVL3 (GOWN DISPOSABLE) ×1
GOWN STRL REUS W/TWL XL LVL3 (GOWN DISPOSABLE) ×2
HANDPIECE INTERPULSE COAX TIP (DISPOSABLE) ×1
HOOD PEEL AWAY FACE SHEILD DIS (HOOD) ×6 IMPLANT
KIT BASIN OR (CUSTOM PROCEDURE TRAY) ×2 IMPLANT
KIT ROOM TURNOVER OR (KITS) ×2 IMPLANT
KNEE CAPITATED TOTAL 3 ×1 IMPLANT
MANIFOLD NEPTUNE II (INSTRUMENTS) ×2 IMPLANT
NEEDLE 18GX1X1/2 (RX/OR ONLY) (NEEDLE) IMPLANT
NEEDLE 22X1 1/2 (OR ONLY) (NEEDLE) ×4 IMPLANT
NS IRRIG 1000ML POUR BTL (IV SOLUTION) ×2 IMPLANT
PACK TOTAL JOINT (CUSTOM PROCEDURE TRAY) ×2 IMPLANT
PAD ARMBOARD 7.5X6 YLW CONV (MISCELLANEOUS) ×4 IMPLANT
SET HNDPC FAN SPRY TIP SCT (DISPOSABLE) ×1 IMPLANT
STRIP CLOSURE SKIN 1/2X4 (GAUZE/BANDAGES/DRESSINGS) ×2 IMPLANT
SUCTION FRAZIER HANDLE 10FR (MISCELLANEOUS)
SUCTION TUBE FRAZIER 10FR DISP (MISCELLANEOUS) IMPLANT
SUT MNCRL AB 3-0 PS2 18 (SUTURE) ×2 IMPLANT
SUT VIC AB 0 CTB1 27 (SUTURE) ×4 IMPLANT
SUT VIC AB 1 CT1 27 (SUTURE) ×2
SUT VIC AB 1 CT1 27XBRD ANBCTR (SUTURE) ×2 IMPLANT
SUT VIC AB 2-0 CT1 27 (SUTURE) ×2
SUT VIC AB 2-0 CT1 TAPERPNT 27 (SUTURE) ×2 IMPLANT
SYR 20CC LL (SYRINGE) ×4 IMPLANT
SYR TB 1ML LUER SLIP (SYRINGE) IMPLANT
TOWEL OR 17X24 6PK STRL BLUE (TOWEL DISPOSABLE) ×2 IMPLANT
TOWEL OR 17X26 10 PK STRL BLUE (TOWEL DISPOSABLE) ×2 IMPLANT
TRAY CATH 16FR W/PLASTIC CATH (SET/KITS/TRAYS/PACK) IMPLANT
TRAY FOLEY CATH 14FR (SET/KITS/TRAYS/PACK) ×2 IMPLANT
WRAP KNEE MAXI GEL POST OP (GAUZE/BANDAGES/DRESSINGS) ×2 IMPLANT

## 2016-11-22 NOTE — Progress Notes (Signed)
Bruise noted to left upper arm states that it happened a few days ago.

## 2016-11-22 NOTE — Anesthesia Postprocedure Evaluation (Signed)
Anesthesia Post Note  Patient: [facility]  Procedure(s) Performed: Procedure(s) (LRB): TOTAL KNEE ARTHROPLASTY (Right)     Patient location during evaluation: PACU Anesthesia Type: Spinal Level of consciousness: oriented and awake and alert Pain management: pain level controlled Vital Signs Assessment: post-procedure vital signs reviewed and stable Respiratory status: spontaneous breathing, respiratory function stable and patient connected to nasal cannula oxygen Cardiovascular status: blood pressure returned to baseline and stable Postop Assessment: no headache, no backache and no apparent nausea or vomiting Anesthetic complications: no    Last Vitals:  Vitals:   11/22/16 0919 11/22/16 0930  BP: 119/78 135/73  Pulse: (!) 57 (!) 52  Resp: 18 16  Temp: 36.4 C   SpO2: 98% 96%    Last Pain:  Vitals:   11/22/16 0919  TempSrc:   PainSc: 0-No pain                 Tinzlee Craker

## 2016-11-22 NOTE — Transfer of Care (Signed)
Immediate Anesthesia Transfer of Care Note  Patient: Kyle [facility]  Procedure(s) Performed: Procedure(s): TOTAL KNEE ARTHROPLASTY (Right)  Patient Location: PACU  Anesthesia Type:Spinal and MAC combined with regional for post-op pain  Level of Consciousness: awake, alert , oriented and patient cooperative  Airway & Oxygen Therapy: Patient Spontanous Breathing and Patient connected to nasal cannula oxygen  Post-op Assessment: Report given to RN and Post -op Vital signs reviewed and stable  Post vital signs: Reviewed and stable  Last Vitals:  Vitals:   11/22/16 0721 11/22/16 0919  BP:  119/78  Pulse: (!) 45 (!) 57  Resp: (!) 9 18  Temp:  36.4 C  SpO2: 100% 98%    Last Pain:  Vitals:   11/22/16 0919  TempSrc:   PainSc: 0-No pain         Complications: No apparent anesthesia complications

## 2016-11-22 NOTE — Progress Notes (Signed)
Orthopedic Tech Progress Note Patient Details:  Kyle Simon 1932/08/13 [id number]  CPM Right Knee CPM Right Knee: On Right Knee Flexion (Degrees): 90 Right Knee Extension (Degrees): 0   [name] 11/22/2016, 9:44 AM Footsie roll for rle

## 2016-11-22 NOTE — H&P (Signed)
Kyle Simon MRN:  [id number] DOB/SEX:  June 16, 1932/male  CHIEF COMPLAINT:  Painful right Knee  HISTORY: Patient is a 83 y.o. male presented with a history of pain in the right knee. Onset of symptoms was gradual starting a few years ago with gradually worsening course since that time. Patient has been treated conservatively with over-the-counter NSAIDs and activity modification. Patient currently rates pain in the knee at 10 out of 10 with activity. There is pain at night.  PAST MEDICAL HISTORY: Patient Active Problem List   Diagnosis Date Noted  . ETD (Eustachian tube dysfunction), right 09/15/2016  . Strain of masseter muscle 09/15/2016  . Trochanteric bursitis of right hip 09/07/2016  . Age-related osteoporosis without current pathological fracture 07/16/2016  . History of lumbar fusion 07/16/2016  . History of total knee arthroplasty, left 07/16/2016  . Cough 01/04/2016  . High risk medications (not anticoagulants) long-term use 01/04/2016  . Hypomagnesemia 12/29/2015  . h/o Hyponatremia 12/29/2015  . Cerumen impaction 08/25/2015  . Diabetes mellitus without complication ([place]) 96/29/5284  . Flatulence symptom 07/25/2015  . Diarrhea 06/21/2015  . Chronic rhinitis 11/01/2014  . Essential hypertension 03/20/2014  . Spondylolisthesis of lumbar region 10/23/2013  . Diverticulosis of colon without hemorrhage 01/22/2013  . B12 deficiency anemia 01/22/2013  . Anemia, unspecified 08/13/2012  . Synovial cyst of lumbar spine 07/26/2011  . Spasms of the hands or feet 07/26/2011  . GERD (gastroesophageal reflux disease) 03/26/2011  . SENILE KERATOSIS 06/12/2008  . Hyperlipidemia 04/19/2007  . SNORING, HX OF 04/19/2007  . BPH (benign prostatic hyperplasia) 04/18/2007  . Hypothyroidism 04/20/2006   Past Medical History:  Diagnosis Date  . Arthritis    BOTH KNEES and hands;Dr.Deveshwar  . Constipation due to pain medication   . Diabetes mellitus without complication ([place])    Type  II. Uses no medication. Pt controls with diet and weight  . Enlarged prostate   . Frequent urination at night   . GERD (gastroesophageal reflux disease)   . Hyperlipidemia   . Hypertension   . Hypothyroidism   . Polio    as a child w/o complications  . Snoring    no sleep apnea  . Torn meniscus   . Transfusion history 2012   post TKR   Past Surgical History:  Procedure Laterality Date  . BACK SURGERY  2011   hemiarthroplasty 01/11/1999 and 6  . CARDIAC CATHETERIZATION  09/2001   negative  . EYE SURGERY  2003   R&L cataract removed & IOL- 2003 & 2007,Dr.Jenkins  . INGUINAL HERNIA REPAIR  2002   right, Dr. Truitt Leep  . JOINT REPLACEMENT  2012   left knee replacement  . LIPOMA EXCISION     back  . MENISCECTOMY Bilateral    Dr. Noemi Chapel  . PROSTATE SURGERY  07/2006   for urinary urgency, Dr. Risa Grill  . ROTATOR CUFF REPAIR Right October, 2016  . sinus & throat surgery-1995  1995  . TONSILLECTOMY    . TOTAL KNEE ARTHROPLASTY  01/11/2011   Procedure: TOTAL KNEE ARTHROPLASTY;  Surgeon: Rudean Haskell, MD;  Location: [facility];  Service: Orthopedics;  Laterality: Left;  . UVULECTOMY  1324   Dr. Erik Obey  . VASECTOMY    . WRIST SURGERY  1959   fracture- right     MEDICATIONS:   Prescriptions Prior to Admission  Medication Sig Dispense Refill Last Dose  . acetaminophen (TYLENOL) 500 MG tablet Take 1,000 mg by mouth at bedtime.    Taking  . aspirin EC  81 MG tablet Take 81 mg by mouth every evening.    Taking  . Calcium Carb-Cholecalciferol (CALCIUM 600 + D PO) Take 1 tablet by mouth every evening.     Marland Kitchen CALCIUM-MAGNESIUM-ZINC PO Take 1 tablet by mouth daily.   Taking  . celecoxib (CELEBREX) 200 MG capsule TAKE 1 CAPSULE DAILY 60 capsule 2 Taking  . Hypromellose (ARTIFICIAL TEARS OP) Place 1 drop into both eyes 2 (two) times daily.   Taking  . ibuprofen (ADVIL,MOTRIN) 600 MG tablet Take 600 mg by mouth 2 (two) times daily.    Taking  . levothyroxine (SYNTHROID, LEVOTHROID) 150 MCG  tablet Take 1 tablet Monday, Wednesday and Friday morning (Patient taking differently: Take 150 mcg by mouth daily before breakfast. Take 1 tablet Monday, Wednesday, Friday, Saturday, and Sunday morning) 36 tablet 1 Taking  . losartan (COZAAR) 50 MG tablet TAKE ONE-HALF (1/2) TABLET EVERY OTHER DAY 90 tablet 1 Taking  . Multiple Vitamin (MULTIVITAMIN WITH MINERALS) TABS Take 1 tablet by mouth daily.   Taking  . Omega-3 Fatty Acids (FISH OIL) 1000 MG CAPS Take 1,000 mg by mouth every evening.     . Potassium 99 MG TABS Take 99 mg by mouth daily.     . Probiotic Product (RESTORA) CAPS One tab qd (Patient taking differently: Take 1 capsule by mouth daily. One tab qd) 90 capsule 3 Taking  . Tamsulosin HCl (FLOMAX) 0.4 MG CAPS Take 0.4 mg by mouth 2 (two) times daily.    Taking  . glucose blood (PRECISION XTRA TEST STRIPS) test strip USE TO CHECK BLOOD SUGAR ONCE DAILY DX E11.9 100 each 2 Taking  . NONFORMULARY OR COMPOUNDED ITEM Glucometer (Precision XTRA- Model #[id number]) 1 each 0 Taking    ALLERGIES:   Allergies  Allergen Reactions  . Simvastatin Other (See Comments)    MYALGIAS  . Oxycodone Other (See Comments)    Severe constipation    REVIEW OF SYSTEMS:  A comprehensive review of systems was negative except for: Musculoskeletal: positive for arthralgias and bone pain   FAMILY HISTORY:   Family History  Problem Relation Age of Onset  . Sudden death Father        MVA  . Coronary artery disease Mother   . Emphysema Mother   . Diverticulosis Mother   . Thyroid disease Mother   . Asthma Brother   . Bone cancer Brother   . Diabetes Paternal Grandmother   . Sarcoidosis Son   . Thyroid disease Daughter   . Anesthesia problems Neg Hx   . Hypotension Neg Hx   . Malignant hyperthermia Neg Hx   . Pseudochol deficiency Neg Hx   . Colon cancer Neg Hx     SOCIAL HISTORY:   Social History  Substance Use Topics  . Smoking status: Former Smoker    Packs/day: 1.00    Years: 18.00     Quit date: 03/01/1966  . Smokeless tobacco: Never Used  . Alcohol use 2.4 oz/week    4 Shots of liquor per week     Comment: social     EXAMINATION:  Vital signs in last 24 hours:    There were no vitals taken for this visit.  General Appearance:    Alert, cooperative, no distress, appears stated age  Head:    Normocephalic, without obvious abnormality, atraumatic  Eyes:    PERRL, conjunctiva/corneas clear, EOM's intact, fundi    benign, both eyes       Ears:    Normal  TM's and external ear canals, both ears  Nose:   Nares normal, septum midline, mucosa normal, no drainage    or sinus tenderness  Throat:   Lips, mucosa, and tongue normal; teeth and gums normal  Neck:   Supple, symmetrical, trachea midline, no adenopathy;       thyroid:  No enlargement/tenderness/nodules; no carotid   bruit or JVD  Back:     Symmetric, no curvature, ROM normal, no CVA tenderness  Lungs:     Clear to auscultation bilaterally, respirations unlabored  Chest wall:    No tenderness or deformity  Heart:    Regular rate and rhythm, S1 and S2 normal, no murmur, rub   or gallop  Abdomen:     Soft, non-tender, bowel sounds active all four quadrants,    no masses, no organomegaly  Genitalia:    Normal male without lesion, discharge or tenderness  Rectal:    Normal tone, normal prostate, no masses or tenderness;   guaiac negative stool  Extremities:   Extremities normal, atraumatic, no cyanosis or edema  Pulses:   2+ and symmetric all extremities  Skin:   Skin color, texture, turgor normal, no rashes or lesions  Lymph nodes:   Cervical, supraclavicular, and axillary nodes normal  Neurologic:   CNII-XII intact. Normal strength, sensation and reflexes      throughout    Musculoskeletal:  ROM 0-120, Ligaments intact,  Imaging Review Plain radiographs demonstrate severe degenerative joint disease of the right knee. The overall alignment is neutral. The bone quality appears to be excellent for age and  reported activity level.  Assessment/Plan: Primary osteoarthritis, right knee   The patient history, physical examination and imaging studies are consistent with advanced degenerative joint disease of the right knee. The patient has failed conservative treatment.  The clearance notes were reviewed.  After discussion with the patient it was felt that Total Knee Replacement was indicated. The procedure,  risks, and benefits of total knee arthroplasty were presented and reviewed. The risks including but not limited to aseptic loosening, infection, blood clots, vascular injury, stiffness, patella tracking problems complications among others were discussed. The patient acknowledged the explanation, agreed to proceed with the plan.  Donia Ast 11/22/2016, 6:13 AM

## 2016-11-22 NOTE — Anesthesia Procedure Notes (Signed)
Anesthesia Regional Block: Adductor canal block   Pre-Anesthetic Checklist: ,, timeout performed, Correct Patient, Correct Site, Correct Laterality, Correct Procedure, Correct Position, site marked, Risks and benefits discussed,  Surgical consent,  Pre-op evaluation,  At surgeon's request and post-op pain management  Laterality: Right  Prep: chloraprep       Needles:  Injection technique: Single-shot  Needle Type: Echogenic Stimulator Needle     Needle Length: 5cm  Needle Gauge: 22     Additional Needles:   Procedures:, nerve stimulator,,, ultrasound used (permanent image in chart),,,,  Narrative:  Start time: 11/22/2016 7:12 AM End time: 11/22/2016 7:17 AM Injection made incrementally with aspirations every 5 mL.  Performed by: Personally  Anesthesiologist: Deyjah Kindel  Additional Notes: Functioning IV was confirmed and monitors were applied.  A 61mm 22ga Arrow echogenic stimulator needle was used. Sterile prep and drape,hand hygiene and sterile gloves were used. Ultrasound guidance: relevant anatomy identified, needle position confirmed, local anesthetic spread visualized around nerve(s)., vascular puncture avoided.  Image printed for medical record. Negative aspiration and negative test dose prior to incremental administration of local anesthetic. The patient tolerated the procedure well.

## 2016-11-22 NOTE — Anesthesia Procedure Notes (Signed)
Spinal  Patient location during procedure: OR Start time: 11/22/2016 7:37 AM End time: 11/22/2016 7:40 AM Staffing Anesthesiologist: Kaylianna Detert Preanesthetic Checklist Completed: patient identified, site marked, surgical consent, pre-op evaluation, timeout performed, IV checked, risks and benefits discussed and monitors and equipment checked Spinal Block Patient position: sitting Prep: DuraPrep Patient monitoring: heart rate, cardiac monitor, continuous pulse ox and blood pressure Approach: midline Location: L2-3 Injection technique: single-shot Needle Needle type: Sprotte  Needle gauge: 24 G Needle length: 9 cm Assessment Sensory level: T4

## 2016-11-22 NOTE — Evaluation (Signed)
Physical Therapy Evaluation Patient Details Name: Kyle Simon MRN: [id number] DOB: 02/13/1933 Today's Date: 11/22/2016   History of Present Illness  Pt is an 83 y/o male s/p elective R TKA. PMH includes DM, HTN, torn L biceps tendon, polio as a child, L TKA, back surgery, and R rotator cuff surgery.   Clinical Impression  Pt is s/p surgery above with deficits below. PTA, pt was independent with functional mobility. Upon eval, pt presenting with post op pain and weakness, and decreased balance. Required min to min guard assist for mobility. Reports family will be able to assist as needed upon d/c and has all necessary DME. Pt reports he will be receiving HHPT at d/c. Will continue to follow acutely to maximize functional mobility independence and safety.     Follow Up Recommendations DC plan and follow up therapy as arranged by surgeon;Supervision for mobility/OOB    Equipment Recommendations  None recommended by PT (Has all necessary DME )    Recommendations for Other Services       Precautions / Restrictions Precautions Precautions: Knee Precaution Booklet Issued: Yes (comment) Precaution Comments: Reviewed supine ther ex with pt.  Restrictions Weight Bearing Restrictions: Yes RLE Weight Bearing: Weight bearing as tolerated      Mobility  Bed Mobility Overal bed mobility: Needs Assistance Bed Mobility: Supine to Sit     Supine to sit: Supervision     General bed mobility comments: Supervision for safety. Increased time, however, no external assist.   Transfers Overall transfer level: Needs assistance Equipment used: Rolling walker (2 wheeled) Transfers: Sit to/from Stand Sit to Stand: Min assist         General transfer comment: Min A for steadying assist verbal cues for safe hand placement.   Ambulation/Gait Ambulation/Gait assistance: Min guard Ambulation Distance (Feet): 50 Feet Assistive device: Rolling walker (2 wheeled) Gait Pattern/deviations:  Step-to pattern;Decreased step length - right;Decreased step length - left;Decreased weight shift to right;Antalgic Gait velocity: Decreased  Gait velocity interpretation: Below normal speed for age/gender General Gait Details: Slow, antalgic gait. Verbal cues for sequencing with RW and for increasing step height with RLE. Also required cues for upright posture and proximity to device.   Stairs            Wheelchair Mobility    Modified Rankin (Stroke Patients Only)       Balance Overall balance assessment: Needs assistance Sitting-balance support: No upper extremity supported;Feet supported Sitting balance-Leahy Scale: Good     Standing balance support: Bilateral upper extremity supported;During functional activity Standing balance-Leahy Scale: Poor Standing balance comment: Reliant on UE support during mobility.                              Pertinent Vitals/Pain Pain Assessment: 0-10 Pain Score: 3  Pain Location: R knee  Pain Descriptors / Indicators: Aching;Operative site guarding Pain Intervention(s): Limited activity within patient's tolerance    Home Living Family/patient expects to be discharged to:: Private residence Living Arrangements: Spouse/significant other;Children;Other relatives Available Help at Discharge: Family;Available 24 hours/day Type of Home: House Home Access: Stairs to enter Entrance Stairs-Rails: None Entrance Stairs-Number of Steps: 3 Home Layout: Two level;Laundry or work area in [place]: Shower seat;Bedside commode;Walker - 2 wheels      Prior Function Level of Independence: Independent               Hand Dominance   Dominant Hand: Left  Extremity/Trunk Assessment   Upper Extremity Assessment Upper Extremity Assessment: Defer to OT evaluation    Lower Extremity Assessment Lower Extremity Assessment: RLE deficits/detail RLE Deficits / Details: Deficits consistent with post op pain and  weakness. Sensory in tact. Able to perform ther ex below.     Cervical / Trunk Assessment Cervical / Trunk Assessment: Normal  Communication   Communication: No difficulties  Cognition Arousal/Alertness: Awake/alert Behavior During Therapy: WFL for tasks assessed/performed Overall Cognitive Status: Within Functional Limits for tasks assessed                                        General Comments General comments (skin integrity, edema, etc.): Pt's wife and daughter present during session.     Exercises Total Joint Exercises Ankle Circles/Pumps: AROM;Both;20 reps Quad Sets: AROM;Right;10 reps Towel Squeeze: AROM;Both;10 reps Short Arc Quad: AROM;Right;10 reps Heel Slides: AROM;Right;10 reps Hip ABduction/ADduction: AROM;Right;10 reps   Assessment/Plan    PT Assessment Patient needs continued PT services  PT Problem List Decreased strength;Decreased range of motion;Decreased balance;Decreased mobility;Decreased knowledge of use of DME;Decreased knowledge of precautions;Pain       PT Treatment Interventions DME instruction;Gait training;Stair training;Functional mobility training;Therapeutic activities;Therapeutic exercise;Balance training;Neuromuscular re-education;Patient/family education    PT Goals (Current goals can be found in the Care Plan section)  Acute Rehab PT Goals Patient Stated Goal: to go home  PT Goal Formulation: With patient Time For Goal Achievement: 11/29/16 Potential to Achieve Goals: Good    Frequency 7X/week   Barriers to discharge        Co-evaluation               AM-PAC PT "6 Clicks" Daily Activity  Outcome Measure Difficulty turning over in bed (including adjusting bedclothes, sheets and blankets)?: None Difficulty moving from lying on back to sitting on the side of the bed? : None Difficulty sitting down on and standing up from a chair with arms (e.g., wheelchair, bedside commode, etc,.)?: Unable Help needed moving  to and from a bed to chair (including a wheelchair)?: A Little Help needed walking in hospital room?: A Little Help needed climbing 3-5 steps with a railing? : A Little 6 Click Score: 18    End of Session Equipment Utilized During Treatment: Gait belt Activity Tolerance: Patient tolerated treatment well Patient left: in chair;with call bell/phone within reach;with family/visitor present Nurse Communication: Mobility status PT Visit Diagnosis: Other abnormalities of gait and mobility (R26.89);Pain Pain - Right/Left: Right Pain - part of body: Knee    Time: 1425-1459 PT Time Calculation (min) (ACUTE ONLY): 34 min   Charges:   PT Evaluation $PT Eval Low Complexity: 1 Low PT Treatments $Gait Training: 8-22 mins   PT G Codes:   PT G-Codes **NOT FOR INPATIENT CLASS** Functional Assessment Tool Used: AM-PAC 6 Clicks Basic Mobility;Clinical judgement Functional Limitation: Mobility: Walking and moving around Mobility: Walking and Moving Around Current Status (H6073): At least 40 percent but less than 60 percent impaired, limited or restricted Mobility: Walking and Moving Around Goal Status [phone]): At least 1 percent but less than 20 percent impaired, limited or restricted    Leighton Ruff, PT, DPT  Acute Rehabilitation Services  Pager: [phone]   Rudean Hitt 11/22/2016, 3:06 PM

## 2016-11-23 ENCOUNTER — Encounter (HOSPITAL_COMMUNITY): Payer: Self-pay | Admitting: Orthopedic Surgery

## 2016-11-23 ENCOUNTER — Ambulatory Visit: Payer: Medicare Other | Admitting: Family Medicine

## 2016-11-23 DIAGNOSIS — H6991 Unspecified Eustachian tube disorder, right ear: Secondary | ICD-10-CM | POA: Diagnosis not present

## 2016-11-23 DIAGNOSIS — M1711 Unilateral primary osteoarthritis, right knee: Secondary | ICD-10-CM | POA: Diagnosis not present

## 2016-11-23 DIAGNOSIS — M719 Bursopathy, unspecified: Secondary | ICD-10-CM | POA: Diagnosis not present

## 2016-11-23 DIAGNOSIS — E871 Hypo-osmolality and hyponatremia: Secondary | ICD-10-CM | POA: Diagnosis not present

## 2016-11-23 DIAGNOSIS — M810 Age-related osteoporosis without current pathological fracture: Secondary | ICD-10-CM | POA: Diagnosis not present

## 2016-11-23 LAB — CBC
HCT: 32.1 % — ABNORMAL LOW (ref 39.0–52.0)
Hemoglobin: 10.5 g/dL — ABNORMAL LOW (ref 13.0–17.0)
MCH: 29.7 pg (ref 26.0–34.0)
MCHC: 32.7 g/dL (ref 30.0–36.0)
MCV: 90.9 fL (ref 78.0–100.0)
Platelets: 226 10*3/uL (ref 150–400)
RBC: 3.53 MIL/uL — ABNORMAL LOW (ref 4.22–5.81)
RDW: 12.8 % (ref 11.5–15.5)
WBC: 13.6 10*3/uL — ABNORMAL HIGH (ref 4.0–10.5)

## 2016-11-23 LAB — BASIC METABOLIC PANEL
Anion gap: 6 (ref 5–15)
BUN: 14 mg/dL (ref 6–20)
CO2: 25 mmol/L (ref 22–32)
Calcium: 8.5 mg/dL — ABNORMAL LOW (ref 8.9–10.3)
Chloride: 99 mmol/L — ABNORMAL LOW (ref 101–111)
Creatinine, Ser: 1 mg/dL (ref 0.61–1.24)
GFR calc Af Amer: >60 mL/min (ref >60)
GFR calc non Af Amer: >60 mL/min (ref >60)
Glucose, Bld: 131 mg/dL — ABNORMAL HIGH (ref 65–99)
Potassium: 4.8 mmol/L (ref 3.5–5.1)
Sodium: 130 mmol/L — ABNORMAL LOW (ref 135–145)

## 2016-11-23 MED ORDER — TRAMADOL HCL 50 MG PO TABS: 50.0000 mg | ORAL_TABLET | Freq: Four times a day (QID) | ORAL | 0 refills | Status: DC

## 2016-11-23 MED ORDER — ASPIRIN 325 MG PO TBEC: 325.0000 mg | DELAYED_RELEASE_TABLET | Freq: Two times a day (BID) | ORAL | 0 refills | Status: DC

## 2016-11-23 MED ORDER — METHOCARBAMOL 500 MG PO TABS: 500.0000 mg | ORAL_TABLET | Freq: Four times a day (QID) | ORAL | 0 refills | Status: DC | PRN

## 2016-11-23 NOTE — Discharge Summary (Signed)
SPORTS MEDICINE & JOINT REPLACEMENT   Lara Mulch, MD   Carlyon Shadow, PA-C [place], [place], [place]  [address]                             [phone]  PATIENT ID: Kyle Simon        MRN:  [id number]          DOB/AGE: 1932-11-12 / 83 y.o.    DISCHARGE SUMMARY  ADMISSION DATE:    11/22/2016 DISCHARGE DATE:   11/23/2016   ADMISSION DIAGNOSIS: primary osteoarthritis right knee    DISCHARGE DIAGNOSIS:  primary osteoarthritis right knee    ADDITIONAL DIAGNOSIS: Active Problems:   S/P total knee replacement  Past Medical History:  Diagnosis Date  . Arthritis    BOTH KNEES and hands;Dr.Deveshwar  . Constipation due to pain medication   . Diabetes mellitus without complication ([place])    Type II. Uses no medication. Pt controls with diet and weight  . Enlarged prostate   . Frequent urination at night   . GERD (gastroesophageal reflux disease)   . Hyperlipidemia   . Hypertension   . Hypothyroidism   . Polio    as a child w/o complications  . Snoring    no sleep apnea  . Torn meniscus   . Transfusion history 2012   post TKR    PROCEDURE: Procedure(s): TOTAL KNEE ARTHROPLASTY on 11/22/2016  CONSULTS:    HISTORY:  See H&P in chart  HOSPITAL COURSE:  ESEQUIEL KLEINFELTER is a 83 y.o. admitted on 11/22/2016 and found to have a diagnosis of primary osteoarthritis right knee.  After appropriate laboratory studies were obtained  they were taken to the operating room on 11/22/2016 and underwent Procedure(s): TOTAL KNEE ARTHROPLASTY.   They were given perioperative antibiotics:  Anti-infectives    Start     Dose/Rate Route Frequency Ordered Stop   11/22/16 1340  ceFAZolin (ANCEF) IVPB 1 g/50 mL premix     1 g 100 mL/hr over 30 Minutes Intravenous Every 6 hours 11/22/16 0924 11/22/16 2216   11/22/16 0700  ceFAZolin (ANCEF) IVPB 2g/100 mL premix     2 g 200 mL/hr over 30 Minutes Intravenous To ShortStay Surgical 11/19/16 0908 11/22/16 0750    .  Patient given  tranexamic acid IV or topical and exparel intra-operatively.  Tolerated the procedure well.    POD# 1: Vital signs were stable.  Patient denied Chest pain, shortness of breath, or calf pain.  Patient was started on Lovenox 30 mg subcutaneously twice daily at 8am.  Consults to PT, OT, and care management were made.  The patient was weight bearing as tolerated.  CPM was placed on the operative leg 0-90 degrees for 6-8 hours a day. When out of the CPM, patient was placed in the foam block to achieve full extension. Incentive spirometry was taught.  Dressing was changed.       POD #2, Continued  PT for ambulation and exercise program.  IV saline locked.  O2 discontinued.    The remainder of the hospital course was dedicated to ambulation and strengthening.   The patient was discharged on 1 Day Post-Op in  Good condition.  Blood products given:none  DIAGNOSTIC STUDIES: Recent vital signs: Patient Vitals for the past 24 hrs:  BP Temp Temp src Pulse Resp SpO2  11/23/16 0610 (!) 132/56 (!) 97.5 F (36.4 C) Oral 60 16 97 %  11/23/16 0130 (!)  116/51 98.2 F (36.8 C) Oral (!) 53 16 96 %  11/22/16 2132 123/65 98.4 F (36.9 C) Oral 64 16 96 %       Recent laboratory studies:  Recent Labs  11/22/16 0635 11/23/16 0551  WBC  --  13.6*  HGB 11.9* 10.5*  HCT 35.0* 32.1*  PLT  --  226    Recent Labs  11/22/16 0635 11/23/16 0551  NA 132* 130*  K 4.4 4.8  CL  --  99*  CO2  --  25  BUN  --  14  CREATININE  --  1.00  GLUCOSE 107* 131*  CALCIUM  --  8.5*   Lab Results  Component Value Date   INR 1.02 12/31/2010     Recent Radiographic Studies :  No results found.  DISCHARGE INSTRUCTIONS: Discharge Instructions    CPM    Complete by:  As directed    Continuous passive motion machine (CPM):      Use the CPM from 0 to 90 for 4-6 hours per day.      You may increase by 10 per day.  You may break it up into 2 or 3 sessions per day.      Use CPM for 2 weeks or until you are told to  stop.   Call MD / Call 911    Complete by:  As directed    If you experience chest pain or shortness of breath, CALL 911 and be transported to the hospital emergency room.  If you develope a fever above 101 F, pus (white drainage) or increased drainage or redness at the wound, or calf pain, call your surgeon's office.   Constipation Prevention    Complete by:  As directed    Drink plenty of fluids.  Prune juice may be helpful.  You may use a stool softener, such as Colace (over the counter) 100 mg twice a day.  Use MiraLax (over the counter) for constipation as needed.   Diet - low sodium heart healthy    Complete by:  As directed    Discharge instructions    Complete by:  As directed    INSTRUCTIONS AFTER JOINT REPLACEMENT   Remove items at home which could result in a fall. This includes throw rugs or furniture in walking pathways ICE to the affected joint every three hours while awake for 30 minutes at a time, for at least the first 3-5 days, and then as needed for pain and swelling.  Continue to use ice for pain and swelling. You may notice swelling that will progress down to the foot and ankle.  This is normal after surgery.  Elevate your leg when you are not up walking on it.   Continue to use the breathing machine you got in the hospital (incentive spirometer) which will help keep your temperature down.  It is common for your temperature to cycle up and down following surgery, especially at night when you are not up moving around and exerting yourself.  The breathing machine keeps your lungs expanded and your temperature down.   DIET:  As you were doing prior to hospitalization, we recommend a well-balanced diet.  DRESSING / WOUND CARE / SHOWERING  Keep the surgical dressing until follow up.  The dressing is water proof, so you can shower without any extra covering.  IF THE DRESSING FALLS OFF or the wound gets wet inside, change the dressing with sterile gauze.  Please use good hand  washing techniques  before changing the dressing.  Do not use any lotions or creams on the incision until instructed by your surgeon.    ACTIVITY  Increase activity slowly as tolerated, but follow the weight bearing instructions below.   No driving for 6 weeks or until further direction given by your physician.  You cannot drive while taking narcotics.  No lifting or carrying greater than 10 lbs. until further directed by your surgeon. Avoid periods of inactivity such as sitting longer than an hour when not asleep. This helps prevent blood clots.  You may return to work once you are authorized by your doctor.     WEIGHT BEARING   Weight bearing as tolerated with assist device (walker, cane, etc) as directed, use it as long as suggested by your surgeon or therapist, typically at least 4-6 weeks.   EXERCISES  Results after joint replacement surgery are often greatly improved when you follow the exercise, range of motion and muscle strengthening exercises prescribed by your doctor. Safety measures are also important to protect the joint from further injury. Any time any of these exercises cause you to have increased pain or swelling, decrease what you are doing until you are comfortable again and then slowly increase them. If you have problems or questions, call your caregiver or physical therapist for advice.   Rehabilitation is important following a joint replacement. After just a few days of immobilization, the muscles of the leg can become weakened and shrink (atrophy).  These exercises are designed to build up the tone and strength of the thigh and leg muscles and to improve motion. Often times heat used for twenty to thirty minutes before working out will loosen up your tissues and help with improving the range of motion but do not use heat for the first two weeks following surgery (sometimes heat can increase post-operative swelling).   These exercises can be done on a training (exercise)  mat, on the floor, on a table or on a bed. Use whatever works the best and is most comfortable for you.    Use music or television while you are exercising so that the exercises are a pleasant break in your day. This will make your life better with the exercises acting as a break in your routine that you can look forward to.   Perform all exercises about fifteen times, three times per day or as directed.  You should exercise both the operative leg and the other leg as well.   Exercises include:   Quad Sets - Tighten up the muscle on the front of the thigh (Quad) and hold for 5-10 seconds.   Straight Leg Raises - With your knee straight (if you were given a brace, keep it on), lift the leg to 60 degrees, hold for 3 seconds, and slowly lower the leg.  Perform this exercise against resistance later as your leg gets stronger.  Leg Slides: Lying on your back, slowly slide your foot toward your buttocks, bending your knee up off the floor (only go as far as is comfortable). Then slowly slide your foot back down until your leg is flat on the floor again.  Angel Wings: Lying on your back spread your legs to the side as far apart as you can without causing discomfort.  Hamstring Strength:  Lying on your back, push your heel against the floor with your leg straight by tightening up the muscles of your buttocks.  Repeat, but this time bend your knee to a comfortable angle,  and push your heel against the floor.  You may put a pillow under the heel to make it more comfortable if necessary.   A rehabilitation program following joint replacement surgery can speed recovery and prevent re-injury in the future due to weakened muscles. Contact your doctor or a physical therapist for more information on knee rehabilitation.    CONSTIPATION  Constipation is defined medically as fewer than three stools per week and severe constipation as less than one stool per week.  Even if you have a regular bowel pattern at home, your  normal regimen is likely to be disrupted due to multiple reasons following surgery.  Combination of anesthesia, postoperative narcotics, change in appetite and fluid intake all can affect your bowels.   YOU MUST use at least one of the following options; they are listed in order of increasing strength to get the job done.  They are all available over the counter, and you may need to use some, POSSIBLY even all of these options:    Drink plenty of fluids (prune juice may be helpful) and high fiber foods Colace 100 mg by mouth twice a day  Senokot for constipation as directed and as needed Dulcolax (bisacodyl), take with full glass of water  Miralax (polyethylene glycol) once or twice a day as needed.  If you have tried all these things and are unable to have a bowel movement in the first 3-4 days after surgery call either your surgeon or your primary doctor.    If you experience loose stools or diarrhea, hold the medications until you stool forms back up.  If your symptoms do not get better within 1 week or if they get worse, check with your doctor.  If you experience "the worst abdominal pain ever" or develop nausea or vomiting, please contact the office immediately for further recommendations for treatment.   ITCHING:  If you experience itching with your medications, try taking only a single pain pill, or even half a pain pill at a time.  You can also use Benadryl over the counter for itching or also to help with sleep.   TED HOSE STOCKINGS:  Use stockings on both legs until for at least 2 weeks or as directed by physician office. They may be removed at night for sleeping.  MEDICATIONS:  See your medication summary on the "After Visit Summary" that nursing will review with you.  You may have some home medications which will be placed on hold until you complete the course of blood thinner medication.  It is important for you to complete the blood thinner medication as prescribed.  PRECAUTIONS:   If you experience chest pain or shortness of breath - call 911 immediately for transfer to the hospital emergency department.   If you develop a fever greater that 101 F, purulent drainage from wound, increased redness or drainage from wound, foul odor from the wound/dressing, or calf pain - CONTACT YOUR SURGEON.                                                   FOLLOW-UP APPOINTMENTS:  If you do not already have a post-op appointment, please call the office for an appointment to be seen by your surgeon.  Guidelines for how soon to be seen are listed in your "After Visit Summary", but are typically between 1-4  weeks after surgery.  OTHER INSTRUCTIONS:   Knee Replacement:  Do not place pillow under knee, focus on keeping the knee straight while resting. CPM instructions: 0-90 degrees, 2 hours in the morning, 2 hours in the afternoon, and 2 hours in the evening. Place foam block, curve side up under heel at all times except when in CPM or when walking.  DO NOT modify, tear, cut, or change the foam block in any way.  MAKE SURE YOU:  Understand these instructions.  Get help right away if you are not doing well or get worse.    Thank you for letting us be a part of your medical care team.  It is a privilege we respect greatly.  We hope these instructions will help you stay on track for a fast and full recovery!   Increase activity slowly as tolerated    Complete by:  As directed       DISCHARGE MEDICATIONS:   Allergies as of 11/23/2016      Reactions   Simvastatin Other (See Comments)   MYALGIAS   Oxycodone Other (See Comments)   Severe constipation      Medication List    STOP taking these medications   celecoxib 200 MG capsule Commonly known as:  CELEBREX   ibuprofen 600 MG tablet Commonly known as:  ADVIL,MOTRIN     TAKE these medications   acetaminophen 500 MG tablet Commonly known as:  TYLENOL Take 1,000 mg by mouth at bedtime.   ARTIFICIAL TEARS OP Place 1 drop into both  eyes 2 (two) times daily.   aspirin 325 MG EC tablet Take 1 tablet (325 mg total) by mouth 2 (two) times daily. What changed:  medication strength  how much to take  when to take this   CALCIUM 600 + D PO Take 1 tablet by mouth every evening.   CALCIUM-MAGNESIUM-ZINC PO Take 1 tablet by mouth daily.   Fish Oil 1000 MG Caps Take 1,000 mg by mouth every evening.   FLOMAX 0.4 MG Caps capsule Generic drug:  tamsulosin Take 0.4 mg by mouth 2 (two) times daily.   glucose blood test strip Commonly known as:  PRECISION XTRA TEST STRIPS USE TO CHECK BLOOD SUGAR ONCE DAILY DX E11.9   levothyroxine 150 MCG tablet Commonly known as:  SYNTHROID, LEVOTHROID Take 1 tablet Monday, Wednesday and Friday morning What changed:  how much to take  how to take this  when to take this  additional instructions   losartan 50 MG tablet Commonly known as:  COZAAR TAKE ONE-HALF (1/2) TABLET EVERY OTHER DAY   methocarbamol 500 MG tablet Commonly known as:  ROBAXIN Take 1-2 tablets (500-1,000 mg total) by mouth every 6 (six) hours as needed for muscle spasms.   multivitamin with minerals Tabs tablet Take 1 tablet by mouth daily.   NONFORMULARY OR COMPOUNDED ITEM Glucometer (Precision XTRA- Model #[phone])   Potassium 99 MG Tabs Take 99 mg by mouth daily.   RESTORA Caps One tab qd What changed:  how much to take  how to take this  when to take this  additional instructions   traMADol 50 MG tablet Commonly known as:  ULTRAM Take 1-2 tablets (50-100 mg total) by mouth every 6 (six) hours.            Durable Medical Equipment        Start     Ordered   11/22/16 1200  DME Walker rolling  Once    Question:  Patient needs a walker to treat with the following condition  Answer:  S/P total knee replacement   11/22/16 1200   11/22/16 1200  DME 3 n 1  Once     11/22/16 1200   11/22/16 1200  DME Bedside commode  Once    Question:  Patient needs a bedside commode  to treat with the following condition  Answer:  S/P total knee replacement   11/22/16 1200       Discharge Care Instructions        Start     Ordered   11/23/16 0000  aspirin EC 325 MG EC tablet  2 times daily     11/23/16 0710   11/23/16 0000  methocarbamol (ROBAXIN) 500 MG tablet  Every 6 hours PRN     11/23/16 0710   11/23/16 0000  traMADol (ULTRAM) 50 MG tablet  Every 6 hours     11/23/16 0710   11/23/16 0000  Call MD / Call 911    Comments:  If you experience chest pain or shortness of breath, CALL 911 and be transported to the hospital emergency room.  If you develope a fever above 101 F, pus (white drainage) or increased drainage or redness at the wound, or calf pain, call your surgeon's office.   11/23/16 1245   11/23/16 0000  Diet - low sodium heart healthy     11/23/16 1245   11/23/16 0000  Constipation Prevention    Comments:  Drink plenty of fluids.  Prune juice may be helpful.  You may use a stool softener, such as Colace (over the counter) 100 mg twice a day.  Use MiraLax (over the counter) for constipation as needed.   11/23/16 1245   11/23/16 0000  Increase activity slowly as tolerated     11/23/16 1245   11/23/16 0000  Discharge instructions    Comments:  INSTRUCTIONS AFTER JOINT REPLACEMENT   Remove items at home which could result in a fall. This includes throw rugs or furniture in walking pathways ICE to the affected joint every three hours while awake for 30 minutes at a time, for at least the first 3-5 days, and then as needed for pain and swelling.  Continue to use ice for pain and swelling. You may notice swelling that will progress down to the foot and ankle.  This is normal after surgery.  Elevate your leg when you are not up walking on it.   Continue to use the breathing machine you got in the hospital (incentive spirometer) which will help keep your temperature down.  It is common for your temperature to cycle up and down following surgery, especially at  night when you are not up moving around and exerting yourself.  The breathing machine keeps your lungs expanded and your temperature down.   DIET:  As you were doing prior to hospitalization, we recommend a well-balanced diet.  DRESSING / WOUND CARE / SHOWERING  Keep the surgical dressing until follow up.  The dressing is water proof, so you can shower without any extra covering.  IF THE DRESSING FALLS OFF or the wound gets wet inside, change the dressing with sterile gauze.  Please use good hand washing techniques before changing the dressing.  Do not use any lotions or creams on the incision until instructed by your surgeon.    ACTIVITY  Increase activity slowly as tolerated, but follow the weight bearing instructions below.   No driving for 6 weeks or until further direction  given by your physician.  You cannot drive while taking narcotics.  No lifting or carrying greater than 10 lbs. until further directed by your surgeon. Avoid periods of inactivity such as sitting longer than an hour when not asleep. This helps prevent blood clots.  You may return to work once you are authorized by your doctor.     WEIGHT BEARING   Weight bearing as tolerated with assist device (walker, cane, etc) as directed, use it as long as suggested by your surgeon or therapist, typically at least 4-6 weeks.   EXERCISES  Results after joint replacement surgery are often greatly improved when you follow the exercise, range of motion and muscle strengthening exercises prescribed by your doctor. Safety measures are also important to protect the joint from further injury. Any time any of these exercises cause you to have increased pain or swelling, decrease what you are doing until you are comfortable again and then slowly increase them. If you have problems or questions, call your caregiver or physical therapist for advice.   Rehabilitation is important following a joint replacement. After just a few days of  immobilization, the muscles of the leg can become weakened and shrink (atrophy).  These exercises are designed to build up the tone and strength of the thigh and leg muscles and to improve motion. Often times heat used for twenty to thirty minutes before working out will loosen up your tissues and help with improving the range of motion but do not use heat for the first two weeks following surgery (sometimes heat can increase post-operative swelling).   These exercises can be done on a training (exercise) mat, on the floor, on a table or on a bed. Use whatever works the best and is most comfortable for you.    Use music or television while you are exercising so that the exercises are a pleasant break in your day. This will make your life better with the exercises acting as a break in your routine that you can look forward to.   Perform all exercises about fifteen times, three times per day or as directed.  You should exercise both the operative leg and the other leg as well.   Exercises include:   Quad Sets - Tighten up the muscle on the front of the thigh (Quad) and hold for 5-10 seconds.   Straight Leg Raises - With your knee straight (if you were given a brace, keep it on), lift the leg to 60 degrees, hold for 3 seconds, and slowly lower the leg.  Perform this exercise against resistance later as your leg gets stronger.  Leg Slides: Lying on your back, slowly slide your foot toward your buttocks, bending your knee up off the floor (only go as far as is comfortable). Then slowly slide your foot back down until your leg is flat on the floor again.  Angel Wings: Lying on your back spread your legs to the side as far apart as you can without causing discomfort.  Hamstring Strength:  Lying on your back, push your heel against the floor with your leg straight by tightening up the muscles of your buttocks.  Repeat, but this time bend your knee to a comfortable angle, and push your heel against the floor.  You  may put a pillow under the heel to make it more comfortable if necessary.   A rehabilitation program following joint replacement surgery can speed recovery and prevent re-injury in the future due to weakened muscles. Contact your  doctor or a physical therapist for more information on knee rehabilitation.    CONSTIPATION  Constipation is defined medically as fewer than three stools per week and severe constipation as less than one stool per week.  Even if you have a regular bowel pattern at home, your normal regimen is likely to be disrupted due to multiple reasons following surgery.  Combination of anesthesia, postoperative narcotics, change in appetite and fluid intake all can affect your bowels.   YOU MUST use at least one of the following options; they are listed in order of increasing strength to get the job done.  They are all available over the counter, and you may need to use some, POSSIBLY even all of these options:    Drink plenty of fluids (prune juice may be helpful) and high fiber foods Colace 100 mg by mouth twice a day  Senokot for constipation as directed and as needed Dulcolax (bisacodyl), take with full glass of water  Miralax (polyethylene glycol) once or twice a day as needed.  If you have tried all these things and are unable to have a bowel movement in the first 3-4 days after surgery call either your surgeon or your primary doctor.    If you experience loose stools or diarrhea, hold the medications until you stool forms back up.  If your symptoms do not get better within 1 week or if they get worse, check with your doctor.  If you experience "the worst abdominal pain ever" or develop nausea or vomiting, please contact the office immediately for further recommendations for treatment.   ITCHING:  If you experience itching with your medications, try taking only a single pain pill, or even half a pain pill at a time.  You can also use Benadryl over the counter for itching or  also to help with sleep.   TED HOSE STOCKINGS:  Use stockings on both legs until for at least 2 weeks or as directed by physician office. They may be removed at night for sleeping.  MEDICATIONS:  See your medication summary on the "After Visit Summary" that nursing will review with you.  You may have some home medications which will be placed on hold until you complete the course of blood thinner medication.  It is important for you to complete the blood thinner medication as prescribed.  PRECAUTIONS:  If you experience chest pain or shortness of breath - call 911 immediately for transfer to the hospital emergency department.   If you develop a fever greater that 101 F, purulent drainage from wound, increased redness or drainage from wound, foul odor from the wound/dressing, or calf pain - CONTACT YOUR SURGEON.                                                   FOLLOW-UP APPOINTMENTS:  If you do not already have a post-op appointment, please call the office for an appointment to be seen by your surgeon.  Guidelines for how soon to be seen are listed in your "After Visit Summary", but are typically between 1-4 weeks after surgery.  OTHER INSTRUCTIONS:   Knee Replacement:  Do not place pillow under knee, focus on keeping the knee straight while resting. CPM instructions: 0-90 degrees, 2 hours in the morning, 2 hours in the afternoon, and 2 hours in the evening. Place foam block,  curve side up under heel at all times except when in CPM or when walking.  DO NOT modify, tear, cut, or change the foam block in any way.  MAKE SURE YOU:  Understand these instructions.  Get help right away if you are not doing well or get worse.    Thank you for letting us be a part of your medical care team.  It is a privilege we respect greatly.  We hope these instructions will help you stay on track for a fast and full recovery!   11/23/16 1245   11/23/16 0000  CPM    Comments:  Continuous passive motion machine  (CPM):      Use the CPM from 0 to 90 for 4-6 hours per day.      You may increase by 10 per day.  You may break it up into 2 or 3 sessions per day.      Use CPM for 2 weeks or until you are told to stop.   11/23/16 1245      FOLLOW UP VISIT:    DISPOSITION: HOME VS. SNF  CONDITION:  Good   Donia Ast 11/23/2016, 12:45 PM

## 2016-11-23 NOTE — Progress Notes (Signed)
SPORTS MEDICINE AND JOINT REPLACEMENT  Lara Mulch, MD    Carlyon Shadow, PA-C [place], [place], [place]  [address]                             [phone]   PROGRESS NOTE  Subjective:  negative for Chest Pain  negative for Shortness of Breath  negative for Nausea/Vomiting   negative for Calf Pain  negative for Bowel Movement   Tolerating Diet: yes         Patient reports pain as 3 on 0-10 scale.    Objective: Vital signs in last 24 hours:   Patient Vitals for the past 24 hrs:  BP Temp Temp src Pulse Resp SpO2  11/23/16 0610 (!) 132/56 (!) 97.5 F (36.4 C) Oral 60 16 97 %  11/23/16 0130 (!) 116/51 98.2 F (36.8 C) Oral (!) 53 16 96 %  11/22/16 2132 123/65 98.4 F (36.9 C) Oral 64 16 96 %  11/22/16 1158 (!) 151/74 97.7 F (36.5 C) Oral (!) 103 16 100 %  11/22/16 1115 (!) 146/76 - - (!) 48 14 96 %  11/22/16 1015 (!) 145/72 - - (!) 49 13 96 %  11/22/16 0945 (!) 147/74 - - (!) 47 14 97 %  11/22/16 0930 135/73 - - (!) 52 16 96 %  11/22/16 0919 119/78 97.6 F (36.4 C) - (!) 57 18 98 %  11/22/16 0721 - - - (!) 45 (!) 9 100 %  11/22/16 0720 - - - (!) 44 16 100 %  11/22/16 0719 - - - (!) 45 18 99 %  11/22/16 0718 - - - (!) 44 10 100 %  11/22/16 0717 - - - (!) 46 18 100 %  11/22/16 0716 - - - (!) 45 13 100 %  11/22/16 0715 - - - (!) 44 13 100 %  11/22/16 0714 - - - (!) 47 15 100 %  11/22/16 0713 - - - (!) 47 17 100 %  11/22/16 0712 - - - (!) 44 10 100 %  11/22/16 0711 - - - (!) 47 16 100 %  11/22/16 0710 - - - (!) 46 13 100 %  11/22/16 0709 - - - (!) 44 11 100 %    @flow {1959:LAST@   Intake/Output from previous day:   09/24 0701 - 09/25 0700 In: 1660 [P.O.:960; I.V.:700] Out: 2990 [Urine:2975]   Intake/Output this shift:   No intake/output data recorded.   Intake/Output      09/24 0701 - 09/25 0700 09/25 0701 - 09/26 0700   P.O. 960    I.V. (mL/kg) 700 (7.8)    IV Piggyback 0    Total Intake(mL/kg) 1660 (18.6)    Urine (mL/kg/hr) 2975 (1.4)    Blood 15    Total Output 2990     Net -1330             LABORATORY DATA:  Recent Labs  11/16/16 0911 11/22/16 0635  WBC 5.1  --   HGB 12.5* 11.9*  HCT 37.1* 35.0*  PLT 216  --     Recent Labs  11/16/16 0911 11/22/16 0635  NA 129* 132*  K 4.6 4.4  CL 100*  --   CO2 22  --   BUN 14  --   CREATININE 0.99  --   GLUCOSE 115* 107*  CALCIUM 9.3  --    Lab Results  Component Value Date  INR 1.02 12/31/2010    Examination:  General appearance: alert, cooperative and no distress Extremities: extremities normal, atraumatic, no cyanosis or edema  Wound Exam: clean, dry, intact   Drainage:  None: wound tissue dry  Motor Exam: Quadriceps and Hamstrings Intact  Sensory Exam: Superficial Peroneal, Deep Peroneal and Tibial normal   Assessment:    1 Day Post-Op  Procedure(s) (LRB): TOTAL KNEE ARTHROPLASTY (Right)  ADDITIONAL DIAGNOSIS:  Active Problems:   S/P total knee replacement     Plan: Physical Therapy as ordered Weight Bearing as Tolerated (WBAT)  DVT Prophylaxis:  Aspirin  DISCHARGE PLAN: Home  DISCHARGE NEEDS: HHPT   Patient doing very well, anticipate D/C home today         Donia Ast 11/23/2016, 7:08 AM

## 2016-11-23 NOTE — Evaluation (Signed)
Occupational Therapy Evaluation Patient Details Name: Kyle Simon MRN: [id number] DOB: 1932/03/29 Today's Date: 11/23/2016    History of Present Illness Pt is an 83 y/o male s/p elective R TKA. PMH includes DM, HTN, torn L biceps tendon, polio as a child, L TKA, back surgery, and R rotator cuff surgery.    Clinical Impression   Pt admitted as above currently demonstrating deficits in his ability to perform ADL, self-care tasks and functional transfers (See OT problem list below). He is currently Min guard-Min A for transfers and should benefit from acute OT for pt/family ed in shower transfer and LB dressing. He has DME and some A/E and will have necessary assist at home.    Follow Up Recommendations  No OT follow up;Supervision - Intermittent    Equipment Recommendations  Other (comment);None recommended by OT (Pt states he has DME and some A/E no needs identified at this time)    Recommendations for Other Services       Precautions / Restrictions Precautions Precautions: Knee Precaution Booklet Issued: Yes (comment) Restrictions Weight Bearing Restrictions: Yes RLE Weight Bearing: Weight bearing as tolerated      Mobility Bed Mobility Overal bed mobility: Needs Assistance Bed Mobility: Supine to Sit     Supine to sit: Supervision     General bed mobility comments: Supervision for safety. Increased time, however, no external assist.   Transfers Overall transfer level: Needs assistance Equipment used: Rolling walker (2 wheeled) Transfers: Sit to/from [facility] Sit to Stand: Min assist Stand pivot transfers: Min assist       General transfer comment: Min A for steadying assist verbal cues for safe hand placement.     Balance Overall balance assessment: Needs assistance Sitting-balance support: No upper extremity supported;Feet supported Sitting balance-Leahy Scale: Good     Standing balance support: Bilateral upper extremity  supported;During functional activity Standing balance-Leahy Scale: Fair Standing balance comment: Reliant on UE support during mobility.                            ADL either performed or assessed with clinical judgement   ADL Overall ADL's : Needs assistance/impaired Eating/Feeding: Independent;Sitting   Grooming: Set up;Sitting   Upper Body Bathing: Independent;Sitting   Lower Body Bathing: Min guard;Sit to/from stand;Set up Lower Body Bathing Details (indicate cue type and reason): Pt spouse states that she can assist PRN Upper Body Dressing : Independent;Sitting   Lower Body Dressing: Minimal assistance;Sit to/from stand Lower Body Dressing Details (indicate cue type and reason): Pt spouse states that she/family can assist PRN Toilet Transfer: Ambulation;RW;BSC;Minimal assistance Toilet Transfer Details (indicate cue type and reason): 3:1 over toilet. Pt requires ocassional vc's for positioning of RW and safety Toileting- Clothing Manipulation and Hygiene: Min guard;Sit to/from stand       Functional mobility during ADLs: Min guard;Minimal assistance;Cueing for safety;Cueing for sequencing;Rolling walker General ADL Comments: Pt/family were educated in Role of OT. Assessment performed and pt agreed to participation in ADL retraining session for bed mobility, toilet transfer, ambulated into bathroom using RW. Min VC's for safety and sequencing required. Pt/spouse state that he has DME and some A/E. Should benefit from LB ADL and shower transfer if here next date.     Vision Baseline Vision/History: No visual deficits Patient Visual Report: No change from baseline       Perception     Praxis      Pertinent Vitals/Pain Pain Assessment: 0-10 Pain  Score: 1  Pain Location: R knee - with activity Pain Descriptors / Indicators: Aching;Operative site guarding Pain Intervention(s): Limited activity within patient's tolerance;Monitored during session;Repositioned      Hand Dominance Left   Extremity/Trunk Assessment Upper Extremity Assessment Upper Extremity Assessment: Overall WFL for tasks assessed (RUE old rotator cuff surgery with some limited A/ROM for overhead reach/shoulder flexion, but WFL's for ADL's noted. Pt states "I had surgery and therapy for a year" LUE: Biceps tendon rupture x1-2 weeks ago, Ortho MD following. Ice assists )   Lower Extremity Assessment Lower Extremity Assessment: Defer to PT evaluation   Cervical / Trunk Assessment Cervical / Trunk Assessment: Normal   Communication Communication Communication: No difficulties   Cognition Arousal/Alertness: Awake/alert Behavior During Therapy: WFL for tasks assessed/performed Overall Cognitive Status: Within Functional Limits for tasks assessed                                     General Comments       Exercises     Shoulder Instructions      Home Living Family/patient expects to be discharged to:: Private residence Living Arrangements: Spouse/significant other;Children;Other relatives Available Help at Discharge: Family;Available 24 hours/day Type of Home: House Home Access: Stairs to enter [employer] of Steps: 3 Entrance Stairs-Rails: None Home Layout: Two level;Laundry or work area in basement     [employer]: Occupational psychologist: Handicapped [place]: Shower seat;Bedside commode;Walker - 2 wheels;Grab bars - tub/shower;Adaptive equipment          Prior Functioning/Environment Level of Independence: Independent                 OT Problem List: Pain;Decreased activity tolerance;Decreased knowledge of use of DME or AE      OT Treatment/Interventions: Self-care/ADL training;DME and/or AE instruction;Therapeutic activities;Patient/family education    OT Goals(Current goals can be found in the care plan section) Acute Rehab OT Goals Patient Stated Goal: Go home when able OT Goal  Formulation: With patient Time For Goal Achievement: 12/07/16 Potential to Achieve Goals: Good  OT Frequency: Min 2X/week   Barriers to D/C:            Co-evaluation              AM-PAC PT "6 Clicks" Daily Activity     Outcome Measure Help from another person eating meals?: None Help from another person taking care of personal grooming?: None Help from another person toileting, which includes using toliet, bedpan, or urinal?: A Little Help from another person bathing (including washing, rinsing, drying)?: A Little Help from another person to put on and taking off regular upper body clothing?: None Help from another person to put on and taking off regular lower body clothing?: A Little 6 Click Score: 21   End of Session Equipment Utilized During Treatment: Rolling walker  Activity Tolerance: Patient tolerated treatment well Patient left: in chair;with call bell/phone within reach;with family/visitor present  OT Visit Diagnosis: Unsteadiness on feet (R26.81);Pain Pain - Right/Left: Right Pain - part of body: Knee                Time: 0755-0820 OT Time Calculation (min): 25 min Charges:  OT General Charges $OT Visit: 1 Visit OT Evaluation $OT Eval Low Complexity: 1 Low OT Treatments $Self Care/Home Management : 8-22 mins G-Codes: OT G-codes **NOT FOR INPATIENT CLASS** Functional  Assessment Tool Used: AM-PAC 6 Clicks Daily Activity;Clinical judgement Functional Limitation: Self care Self Care Current Status (P0340): At least 20 percent but less than 40 percent impaired, limited or restricted Self Care Goal Status (B5248): At least 1 percent but less than 20 percent impaired, limited or restricted    Almyra Deforest, OTR/L 11/23/2016, 8:34 AM

## 2016-11-23 NOTE — Progress Notes (Signed)
Removed IV, provided discharge education/instructions, all questions and concerns addressed, Pt not in distress, discharged home accompanied by wife.

## 2016-11-23 NOTE — Care Management Note (Signed)
Case Management Note  Patient Details  Name: Kyle Simon MRN: [id number] Date of Birth: 1932-07-22  Subjective/Objective:   83 yr old gentleman s/p right total knee arthroplasty.                 Action/Plan: Case manager spoke with patient concerning discharge plapn and DME. Patient was preoperatively setup with [facility], no changes. He has RW and 3in1, CPM has been delivered to patient's home. He will have family support at discharge.     Expected Discharge Date:  11/23/16               Expected Discharge Plan:  [place]  In-House Referral:  NA  Discharge planning Services  CM Consult  Post Acute Care Choice:  Home Health, Durable Medical Equipment Choice offered to:  Patient  DME Arranged:  CPM (has RW and 3in1) DME Agency:  [employer]  HH Arranged:  PT [place]:  [facility] (formerly [employer])  Status of Service:  Completed, signed off  If discussed at [employer], dates discussed:    Additional Comments:  Ninfa Meeker, RN 11/23/2016, 2:22 PM

## 2016-11-23 NOTE — Progress Notes (Signed)
Physical Therapy Treatment Patient Details Name: Kyle Simon MRN: [id number] DOB: 03-22-1932 Today's Date: 11/23/2016    History of Present Illness Pt is an 83 y/o male s/p elective R TKA. PMH includes DM, HTN, torn L biceps tendon, polio as a child, L TKA, back surgery, and R rotator cuff surgery.     PT Comments    Pt performed increased gait during session this am.  PTA reviewed therapeutic exercises in supine with patient and re-educated patient on knee precautions.  Plan this pm for stair training.     Follow Up Recommendations  DC plan and follow up therapy as arranged by surgeon;Supervision for mobility/OOB     Equipment Recommendations  None recommended by PT (Has all necessary DME at home.  )    Recommendations for Other Services       Precautions / Restrictions Precautions Precautions: Knee Precaution Booklet Issued: Yes (comment) Precaution Comments: Reviewed supine ther ex with pt.  Restrictions Weight Bearing Restrictions: Yes RLE Weight Bearing: Weight bearing as tolerated    Mobility  Bed Mobility Overal bed mobility: Needs Assistance Bed Mobility: Supine to Sit     Supine to sit: Supervision     General bed mobility comments: Supervision for safety. Increased time, however, no external assist.   Transfers Overall transfer level: Needs assistance Equipment used: Rolling walker (2 wheeled) Transfers: Sit to/from Stand Sit to Stand: Min guard Stand pivot transfers: Min assist       General transfer comment: Cues for hand placement to and from seated surface.  Pt slow to ascend.    Ambulation/Gait Ambulation/Gait assistance: Min guard Ambulation Distance (Feet): 250 Feet Assistive device: Rolling walker (2 wheeled) Gait Pattern/deviations: Step-through pattern;Decreased stride length;Trunk flexed Gait velocity: Decreased  Gait velocity interpretation: Below normal speed for age/gender General Gait Details: Slow, antalgic gait. Verbal cues  for sequencing with RW and for increasing step height with RLE. Also required cues for upright posture and proximity to device.   Pt with LOB back away from commode and required min guard for safety.  Daughter reports his balance has been off pre-surgery.     Stairs            Wheelchair Mobility    Modified Rankin (Stroke Patients Only)       Balance Overall balance assessment: Needs assistance Sitting-balance support: No upper extremity supported;Feet supported Sitting balance-Leahy Scale: Good     Standing balance support: Bilateral upper extremity supported;During functional activity Standing balance-Leahy Scale: Fair Standing balance comment: Reliant on UE support during mobility.                             Cognition Arousal/Alertness: Awake/alert Behavior During Therapy: WFL for tasks assessed/performed Overall Cognitive Status: Within Functional Limits for tasks assessed                                        Exercises Total Joint Exercises Ankle Circles/Pumps: AROM;Both;20 reps;Supine Quad Sets: AROM;Right;10 reps;Supine Towel Squeeze: AROM;Both;10 reps;Supine Short Arc Quad: AROM;Right;10 reps;Supine Heel Slides: AROM;Right;10 reps;Supine Hip ABduction/ADduction: AROM;Right;10 reps;Supine Straight Leg Raises: AROM;Right;10 reps;Supine Goniometric ROM: 91 degrees flexion in R knee.      General Comments        Pertinent Vitals/Pain Pain Assessment: 0-10 Pain Score: 4  Pain Location: R knee - with activity Pain Descriptors / Indicators: Aching;Operative  site guarding Pain Intervention(s): Monitored during session;Repositioned;Ice applied    Home Living                      Prior Function            PT Goals (current goals can now be found in the care plan section) Acute Rehab PT Goals Patient Stated Goal: Go home when able Potential to Achieve Goals: Good Progress towards PT goals: Progressing toward  goals    Frequency    7X/week      PT Plan Current plan remains appropriate    Co-evaluation              AM-PAC PT "6 Clicks" Daily Activity  Outcome Measure  Difficulty turning over in bed (including adjusting bedclothes, sheets and blankets)?: None Difficulty moving from lying on back to sitting on the side of the bed? : None Difficulty sitting down on and standing up from a chair with arms (e.g., wheelchair, bedside commode, etc,.)?: A Little Help needed moving to and from a bed to chair (including a wheelchair)?: A Little Help needed walking in hospital room?: A Little Help needed climbing 3-5 steps with a railing? : A Little 6 Click Score: 20    End of Session Equipment Utilized During Treatment: Gait belt Activity Tolerance: Patient tolerated treatment well Patient left: in chair;with call bell/phone within reach;with family/visitor present Nurse Communication: Mobility status PT Visit Diagnosis: Other abnormalities of gait and mobility (R26.89);Pain Pain - Right/Left: Right Pain - part of body: Knee     Time: 4010-2725 PT Time Calculation (min) (ACUTE ONLY): 30 min  Charges:  $Gait Training: 8-22 mins $Therapeutic Exercise: 8-22 mins                    G Codes:       Governor Rooks, PTA pager [phone]    Cristela Blue 11/23/2016, 12:23 PM

## 2016-11-23 NOTE — Op Note (Signed)
TOTAL KNEE REPLACEMENT OPERATIVE NOTE:  11/22/2016  4:13 PM  PATIENT:  Kyle Simon  83 y.o. male  PRE-OPERATIVE DIAGNOSIS:  primary osteoarthritis right knee  POST-OPERATIVE DIAGNOSIS:  primary osteoarthritis right knee  PROCEDURE:  Procedure(s): TOTAL KNEE ARTHROPLASTY  SURGEON:  Surgeon(s): Vickey Huger, MD  PHYSICIAN ASSISTANT: Carlyon Shadow, [facility]   ANESTHESIA:   spinal  DRAINS: Hemovac  SPECIMEN: None  COUNTS:  Correct  TOURNIQUET:   Total Tourniquet Time Documented: Thigh (Right) - 43 minutes Total: Thigh (Right) - 43 minutes   DICTATION:  Indication for procedure:    The patient is a 83 y.o. male who has failed conservative treatment for primary osteoarthritis right knee.  Informed consent was obtained prior to anesthesia. The risks versus benefits of the operation were explain and in a way the patient can, and did, understand.   On the implant demand matching protocol, this patient scored 10.  Therefore, this patient was not receive a polyethylene insert with vitamin E which is a high demand implant.  Description of procedure:     The patient was taken to the operating room and placed under anesthesia.  The patient was positioned in the usual fashion taking care that all body parts were adequately padded and/or protected.  I foley catheter was not placed.  A tourniquet was applied and the leg prepped and draped in the usual sterile fashion.  The extremity was exsanguinated with the esmarch and tourniquet inflated to 350 mmHg.  Pre-operative range of motion was normal.  The knee was in 5 degree of mild varus.  A midline incision approximately 6-7 inches long was made with a #10 blade.  A new blade was used to make a parapatellar arthrotomy going 2-3 cm into the quadriceps tendon, over the patella, and alongside the medial aspect of the patellar tendon.  A synovectomy was then performed with the #10 blade and forceps. I then elevated the deep MCL off the medial  tibial metaphysis subperiosteally around to the semimembranosus attachment.    I everted the patella and used calipers to measure patellar thickness.  I used the reamer to ream down to appropriate thickness to recreate the native thickness.  I then removed excess bone with the rongeur and sagittal saw.  I used the appropriately sized template and drilled the three lug holes.  I then put the trial in place and measured the thickness with the calipers to ensure recreation of the native thickness.  The trial was then removed and the patella subluxed and the knee brought into flexion.  A homan retractor was place to retract and protect the patella and lateral structures.  A Z-retractor was place medially to protect the medial structures.  The extra-medullary alignment system was used to make cut the tibial articular surface perpendicular to the anamotic axis of the tibia and in 3 degrees of posterior slope.  The cut surface and alignment jig was removed.  I then used the intramedullary alignment guide to make a 6 valgus cut on the distal femur.  I then marked out the epicondylar axis on the distal femur.  The posterior condylar axis measured 3 degrees.  I then used the anterior referencing sizer and measured the femur to be a size 9.  The 4-In-1 cutting block was screwed into place in external rotation matching the posterior condylar angle, making our cuts perpendicular to the epicondylar axis.  Anterior, posterior and chamfer cuts were made with the sagittal saw.  The cutting block and cut pieces  were removed.  A lamina spreader was placed in 90 degrees of flexion.  The ACL, PCL, menisci, and posterior condylar osteophytes were removed.  A 10 mm spacer blocked was found to offer good flexion and extension gap balance after mild in degree releasing.   The scoop retractor was then placed and the femoral finishing block was pinned in place.  The small sagittal saw was used as well as the lug drill to finish the  femur.  The block and cut surfaces were removed and the medullary canal hole filled with autograft bone from the cut pieces.  The tibia was delivered forward in deep flexion and external rotation.  A size E tray was selected and pinned into place centered on the medial 1/3 of the tibial tubercle.  The reamer and keel was used to prepare the tibia through the tray.    I then trialed with the size 9 femur, size E tibia, a 10 mm insert and the 35 patella.  I had excellent flexion/extension gap balance, excellent patella tracking.  Flexion was full and beyond 120 degrees; extension was zero.  These components were chosen and the staff opened them to me on the back table while the knee was lavaged copiously and the cement mixed.  The soft tissue was infiltrated with 60cc of exparel 1.3% through a 21 gauge needle.  I cemented in the components and removed all excess cement.  The polyethylene tibial component was snapped into place and the knee placed in extension while cement was hardening.  The capsule was infilltrated with 30cc of .25% Marcaine with epinephrine.  A hemovac was place in the joint exiting superolaterally.  A pain pump was place superomedially superficial to the arthrotomy.  Once the cement was hard, the tourniquet was let down.  Hemostasis was obtained.  The arthrotomy was closed with figure-8 #1 vicryl sutures.  The deep soft tissues were closed with #0 vicryls and the subcuticular layer closed with a running #2-0 vicryl.  The skin was reapproximated and closed with skin staples.  The wound was dressed with xeroform, 4 x4's, 2 ABD sponges, a single layer of webril and a TED stocking.   The patient was then awakened, extubated, and taken to the recovery room in stable condition.  BLOOD LOSS:  300cc DRAINS: 1 hemovac, 1 pain catheter COMPLICATIONS:  None.  PLAN OF CARE: Admit to inpatient   PATIENT DISPOSITION:  PACU - hemodynamically stable.   Delay start of Pharmacological VTE agent  (>24hrs) due to surgical blood loss or risk of bleeding:  not applicable  Please fax a copy of this op note to my office at [phone] (please only include page 1 and 2 of the Case Information op note)

## 2016-11-23 NOTE — Care Management Obs Status (Signed)
[facility] NOTIFICATION   Patient Details  Name: DELAND SLOCUMB MRN: [id number] Date of Birth: 25-Jul-1932   Medicare Observation Status Notification Given:  Yes    Carles Collet, RN 11/23/2016, 10:13 AM

## 2016-11-23 NOTE — Progress Notes (Signed)
Physical Therapy Treatment Patient Details Name: Kyle Simon MRN: [id number] DOB: 07-12-1932 Today's Date: 11/23/2016    History of Present Illness Pt is an 83 y/o male s/p elective R TKA. PMH includes DM, HTN, torn L biceps tendon, polio as a child, L TKA, back surgery, and R rotator cuff surgery.     PT Comments    PTA reviewed stair training and issued handout on technique for carryover at home with use.  Pt with urinary incontinence after using the bathroom.  Pt assisted with pericare and changing lower body dressing after incontinence.  Plan for d/c home with support from wife.    Follow Up Recommendations  DC plan and follow up therapy as arranged by surgeon;Supervision for mobility/OOB     Equipment Recommendations  None recommended by PT (Has all necessary DME at home. )    Recommendations for Other Services       Precautions / Restrictions Precautions Precautions: Knee Precaution Booklet Issued: Yes (comment) Precaution Comments: Reviewed supine ther ex with pt.  Restrictions Weight Bearing Restrictions: Yes RLE Weight Bearing: Weight bearing as tolerated    Mobility  Bed Mobility Overal bed mobility: Needs Assistance Bed Mobility: Sit to Supine     Sit to supine: Supervision   General bed mobility comments: Supervision for safety. Increased time, however, no external assist.   Transfers Overall transfer level: Needs assistance Equipment used: Rolling walker (2 wheeled) Transfers: Sit to/from Stand Sit to Stand: Supervision         General transfer comment: Cues for hand placement to and from seated surface.  Pt slow to ascend.    Ambulation/Gait Ambulation/Gait assistance: Min guard Ambulation Distance (Feet): 250 Feet Assistive device: Rolling walker (2 wheeled) Gait Pattern/deviations: Step-through pattern;Decreased stride length;Trunk flexed Gait velocity: Decreased  Gait velocity interpretation: Below normal speed for age/gender General  Gait Details: Slow, antalgic gait. Verbal cues for sequencing with RW and for increasing step height with RLE. Also required cues for upright posture and proximity to device.      Stairs Stairs: Yes   Stair Management: Forwards;No rails;Backwards;With walker Number of Stairs: 3 General stair comments: Pt performed 2 stairs backwards with RW and min assist for support to maintain RW.  Cues for sequencing and RW placement.  pt performed curb training x1 forward to ascend/descend.  Pt tolerated well.    Wheelchair Mobility    Modified Rankin (Stroke Patients Only)       Balance Overall balance assessment: Needs assistance Sitting-balance support: No upper extremity supported;Feet supported Sitting balance-Leahy Scale: Good     Standing balance support: Bilateral upper extremity supported;During functional activity Standing balance-Leahy Scale: Fair Standing balance comment: Reliant on UE support during mobility.                             Cognition Arousal/Alertness: Awake/alert Behavior During Therapy: WFL for tasks assessed/performed Overall Cognitive Status: Within Functional Limits for tasks assessed                                        Exercises    General Comments        Pertinent Vitals/Pain Pain Assessment: 0-10 Pain Score: 4  Pain Location: R knee - with activity Pain Descriptors / Indicators: Aching;Operative site guarding Pain Intervention(s): Monitored during session;Repositioned;Ice applied    Home Living  Prior Function            PT Goals (current goals can now be found in the care plan section) Acute Rehab PT Goals Patient Stated Goal: Go home Potential to Achieve Goals: Good Progress towards PT goals: Progressing toward goals    Frequency    7X/week      PT Plan Current plan remains appropriate    Co-evaluation              AM-PAC PT "6 Clicks" Daily Activity   Outcome Measure  Difficulty turning over in bed (including adjusting bedclothes, sheets and blankets)?: None Difficulty moving from lying on back to sitting on the side of the bed? : None Difficulty sitting down on and standing up from a chair with arms (e.g., wheelchair, bedside commode, etc,.)?: A Little Help needed moving to and from a bed to chair (including a wheelchair)?: A Little Help needed walking in hospital room?: A Little Help needed climbing 3-5 steps with a railing? : A Little 6 Click Score: 20    End of Session Equipment Utilized During Treatment: Gait belt Activity Tolerance: Patient tolerated treatment well Patient left: in chair;with call bell/phone within reach;with family/visitor present Nurse Communication: Mobility status PT Visit Diagnosis: Other abnormalities of gait and mobility (R26.89);Pain Pain - Right/Left: Right Pain - part of body: Knee     Time: 3976-7341 PT Time Calculation (min) (ACUTE ONLY): 33 min  Charges:  $Gait Training: 8-22 mins $Therapeutic Activity: 8-22 mins                    G Codes:       Governor Rooks, PTA pager [phone]    Cristela Blue 11/23/2016, 1:59 PM

## 2016-11-24 DIAGNOSIS — M19041 Primary osteoarthritis, right hand: Secondary | ICD-10-CM | POA: Diagnosis not present

## 2016-11-24 DIAGNOSIS — E119 Type 2 diabetes mellitus without complications: Secondary | ICD-10-CM | POA: Diagnosis not present

## 2016-11-24 DIAGNOSIS — Z471 Aftercare following joint replacement surgery: Secondary | ICD-10-CM | POA: Diagnosis not present

## 2016-11-24 DIAGNOSIS — M7061 Trochanteric bursitis, right hip: Secondary | ICD-10-CM | POA: Diagnosis not present

## 2016-11-24 DIAGNOSIS — M4316 Spondylolisthesis, lumbar region: Secondary | ICD-10-CM | POA: Diagnosis not present

## 2016-11-24 DIAGNOSIS — M19042 Primary osteoarthritis, left hand: Secondary | ICD-10-CM | POA: Diagnosis not present

## 2016-11-25 DIAGNOSIS — E119 Type 2 diabetes mellitus without complications: Secondary | ICD-10-CM | POA: Diagnosis not present

## 2016-11-25 DIAGNOSIS — Z471 Aftercare following joint replacement surgery: Secondary | ICD-10-CM | POA: Diagnosis not present

## 2016-11-25 DIAGNOSIS — M19041 Primary osteoarthritis, right hand: Secondary | ICD-10-CM | POA: Diagnosis not present

## 2016-11-25 DIAGNOSIS — M7061 Trochanteric bursitis, right hip: Secondary | ICD-10-CM | POA: Diagnosis not present

## 2016-11-25 DIAGNOSIS — M19042 Primary osteoarthritis, left hand: Secondary | ICD-10-CM | POA: Diagnosis not present

## 2016-11-25 DIAGNOSIS — M4316 Spondylolisthesis, lumbar region: Secondary | ICD-10-CM | POA: Diagnosis not present

## 2016-11-26 DIAGNOSIS — Z471 Aftercare following joint replacement surgery: Secondary | ICD-10-CM | POA: Diagnosis not present

## 2016-11-26 DIAGNOSIS — E119 Type 2 diabetes mellitus without complications: Secondary | ICD-10-CM | POA: Diagnosis not present

## 2016-11-26 DIAGNOSIS — M7061 Trochanteric bursitis, right hip: Secondary | ICD-10-CM | POA: Diagnosis not present

## 2016-11-26 DIAGNOSIS — M19042 Primary osteoarthritis, left hand: Secondary | ICD-10-CM | POA: Diagnosis not present

## 2016-11-26 DIAGNOSIS — M19041 Primary osteoarthritis, right hand: Secondary | ICD-10-CM | POA: Diagnosis not present

## 2016-11-26 DIAGNOSIS — M4316 Spondylolisthesis, lumbar region: Secondary | ICD-10-CM | POA: Diagnosis not present

## 2016-11-29 DIAGNOSIS — E119 Type 2 diabetes mellitus without complications: Secondary | ICD-10-CM | POA: Diagnosis not present

## 2016-11-29 DIAGNOSIS — M4316 Spondylolisthesis, lumbar region: Secondary | ICD-10-CM | POA: Diagnosis not present

## 2016-11-29 DIAGNOSIS — M19042 Primary osteoarthritis, left hand: Secondary | ICD-10-CM | POA: Diagnosis not present

## 2016-11-29 DIAGNOSIS — Z471 Aftercare following joint replacement surgery: Secondary | ICD-10-CM | POA: Diagnosis not present

## 2016-11-29 DIAGNOSIS — M19041 Primary osteoarthritis, right hand: Secondary | ICD-10-CM | POA: Diagnosis not present

## 2016-11-29 DIAGNOSIS — M7061 Trochanteric bursitis, right hip: Secondary | ICD-10-CM | POA: Diagnosis not present

## 2016-12-01 DIAGNOSIS — Z471 Aftercare following joint replacement surgery: Secondary | ICD-10-CM | POA: Diagnosis not present

## 2016-12-01 DIAGNOSIS — M19041 Primary osteoarthritis, right hand: Secondary | ICD-10-CM | POA: Diagnosis not present

## 2016-12-01 DIAGNOSIS — E119 Type 2 diabetes mellitus without complications: Secondary | ICD-10-CM | POA: Diagnosis not present

## 2016-12-01 DIAGNOSIS — M4316 Spondylolisthesis, lumbar region: Secondary | ICD-10-CM | POA: Diagnosis not present

## 2016-12-01 DIAGNOSIS — M7061 Trochanteric bursitis, right hip: Secondary | ICD-10-CM | POA: Diagnosis not present

## 2016-12-01 DIAGNOSIS — M19042 Primary osteoarthritis, left hand: Secondary | ICD-10-CM | POA: Diagnosis not present

## 2016-12-02 DIAGNOSIS — M2341 Loose body in knee, right knee: Secondary | ICD-10-CM | POA: Diagnosis not present

## 2016-12-02 DIAGNOSIS — Z96651 Presence of right artificial knee joint: Secondary | ICD-10-CM | POA: Diagnosis not present

## 2016-12-02 DIAGNOSIS — M25461 Effusion, right knee: Secondary | ICD-10-CM | POA: Diagnosis not present

## 2016-12-02 DIAGNOSIS — M25561 Pain in right knee: Secondary | ICD-10-CM | POA: Diagnosis not present

## 2016-12-06 DIAGNOSIS — Z96651 Presence of right artificial knee joint: Secondary | ICD-10-CM | POA: Diagnosis not present

## 2016-12-06 DIAGNOSIS — M25561 Pain in right knee: Secondary | ICD-10-CM | POA: Diagnosis not present

## 2016-12-14 DIAGNOSIS — Z96651 Presence of right artificial knee joint: Secondary | ICD-10-CM | POA: Diagnosis not present

## 2016-12-14 DIAGNOSIS — M25561 Pain in right knee: Secondary | ICD-10-CM | POA: Diagnosis not present

## 2016-12-15 ENCOUNTER — Encounter: Payer: Self-pay | Admitting: Family Medicine

## 2016-12-15 ENCOUNTER — Ambulatory Visit (INDEPENDENT_AMBULATORY_CARE_PROVIDER_SITE_OTHER): Payer: Medicare Other | Admitting: Family Medicine

## 2016-12-15 VITALS — BP 160/67 | HR 55 | Ht 71.0 in | Wt 190.1 lb

## 2016-12-15 DIAGNOSIS — M25569 Pain in unspecified knee: Secondary | ICD-10-CM | POA: Diagnosis not present

## 2016-12-15 DIAGNOSIS — G8918 Other acute postprocedural pain: Secondary | ICD-10-CM

## 2016-12-15 DIAGNOSIS — T402X5A Adverse effect of other opioids, initial encounter: Secondary | ICD-10-CM | POA: Diagnosis not present

## 2016-12-15 DIAGNOSIS — K5903 Drug induced constipation: Secondary | ICD-10-CM | POA: Diagnosis not present

## 2016-12-15 MED ORDER — AMITRIPTYLINE HCL 25 MG PO TABS: ORAL_TABLET | ORAL | 0 refills | Status: DC

## 2016-12-15 NOTE — Progress Notes (Signed)
Impression and Recommendations:    1. Therapeutic opioid-induced constipation (OIC)   2. Postoperative pain, acute, knee     The patient was counseled, risk factors were discussed, anticipatory guidance given.   New Prescriptions   LANCETS ULTRA THIN 30G MISC    Patient is to use to check blood glucose in the AM fasting and 2 hours after largest meal.    Gross side effects, risk and benefits, and alternatives of medications and treatment plan in general discussed with patient.  Patient is aware that all medications have potential side effects and we are unable to predict every side effect or drug-drug interaction that may occur.   Patient will call with any questions prior to using medication if they have concerns.  Expresses verbal understanding and consents to current therapy and treatment regimen.  No barriers to understanding were identified.  Red flag symptoms and signs discussed in detail.  Patient expressed understanding regarding what to do in case of emergency\urgent symptoms  Please see AVS handed out to patient at the end of our visit for further patient instructions/ counseling done pertaining to today's office visit.   Return if symptoms worsen or fail to improve, for Please follow-up with your orthopedist for further management of these postoperative complications.     Note: This document was prepared using Dragon voice recognition software and may include unintentional dictation errors.  Kyle Simon 11:35 AM --------------------------------------------------------------------------------------------------------------------------------------------------------------------------------------------------------------------------------------------    Subjective:    CC:  Chief Complaint  Patient presents with  . Constipation    HPI: Kyle Simon is a 83 y.o. male who presents to [facility] today for issues as discussed  below.  - Patient is here for acute complaints of no bowel movement in 7 days or longer.  He had right knee replacement on [phone] and still remains on pain medication of hydrocodone 5 /325 one tablet every 6 hours.  --> was on tramadol post-op but made him constipated.  Seen last Friday by Dr. Lorre Nick.    Started colace, then miralax next during hosp- came home on tramadol-  Worse constipation. Fleets enema- one time 10  days ago.  Changed to norco 4 days ago--> from tramadol-->  Then gave him ambien for sleep and sample of naloxegol- taking daily since Friday, started Amitiza 10mcg yesterday and told to stop the naloxegol.     Patient was seen by Dr. Dwyane Dee on [phone] for his hypothyroidism.  He is due to follow up in 6 weeks for this.    Wt Readings from Last 3 Encounters:  07/12/17 192 lb 11.2 oz (87.4 kg)  05/18/17 190 lb 6.4 oz (86.4 kg)  04/29/17 192 lb 6.4 oz (87.3 kg)   BP Readings from Last 3 Encounters:  07/12/17 (!) 142/80  05/18/17 117/70  04/29/17 124/78   Pulse Readings from Last 3 Encounters:  07/12/17 63  05/18/17 86  04/29/17 (!) 51   BMI Readings from Last 3 Encounters:  07/12/17 26.88 kg/m  05/18/17 26.56 kg/m  04/29/17 26.83 kg/m     Patient Care Team    Relationship Specialty Notifications Start End  Mellody Dance, DO PCP - General Family Medicine  08/05/15   Rana Snare, MD Consulting Physician Urology  11/10/15   Lavonna Monarch, MD Consulting Physician Dermatology  7/56/43   Jodi Marble, MD Consulting Physician Otolaryngology  11/10/15   Calton Dach, MD Referring Physician Optometry  11/10/15   Inocencio Homes, [place] Physician  Podiatry  11/10/15   Bo Merino, MD Consulting Physician Rheumatology  11/10/15   Vickey Huger, MD Consulting Physician Orthopedic Surgery  11/10/15   Erline Levine, MD Consulting Physician Neurosurgery  11/10/15   Deboraha Sprang, MD Consulting Physician Cardiology  01/04/16    Comment: seen '12 last   Elayne Snare, MD Consulting Physician Endocrinology  04/14/17   Kathrynn Ducking, MD Consulting Physician Neurology  4/70/96   Jodi Marble, MD Consulting Physician Otolaryngology  04/22/17      Patient Active Problem List   Diagnosis Date Noted  . DM assoc with CKD (chronic kidney disease), stage III ([place]) 04/14/2017    Priority: High  . Diabetes mellitus without complication ([place]) 28/36/6294    Priority: High  . Hypertension associated with diabetes ([place]) 03/20/2014    Priority: High  . Hyperlipidemia associated with type 2 diabetes mellitus ([place]) 04/19/2007    Priority: High  . H/O total knee replacement, bilateral 11/22/2016    Priority: Medium  . Hypomagnesemia 12/29/2015    Priority: Medium  . h/o Hyponatremia 12/29/2015    Priority: Medium  . BPH (benign prostatic hyperplasia) 04/18/2007    Priority: Medium  . Hypothyroidism 04/20/2006    Priority: Medium  . Age-related osteoporosis without current pathological fracture 07/16/2016    Priority: Low  . Flatulence symptom 07/25/2015    Priority: Low  . Diarrhea 06/21/2015    Priority: Low  . B12 deficiency anemia 01/22/2013    Priority: Low  . Anemia, unspecified 08/13/2012    Priority: Low  . GERD (gastroesophageal reflux disease) 03/26/2011    Priority: Low  . Lumbar radiculopathy 06/16/2017  . Total knee replacement status, right 04/14/2017  . Arthralgia of right temporomandibular joint 03/02/2017  . Chronic eczematous otitis externa of both ears 03/02/2017  . Presbycusis of both ears 03/02/2017  . ETD (Eustachian tube dysfunction), right 09/15/2016  . Strain of masseter muscle 09/15/2016  . Trochanteric bursitis of right hip 09/07/2016  . History of lumbar fusion 07/16/2016  . History of total knee arthroplasty, left 07/16/2016  . Cough 01/04/2016  . High risk medications (not anticoagulants) long-term use 01/04/2016  . Cerumen impaction 08/25/2015  . Chronic rhinitis 11/01/2014  . Spondylolisthesis of  lumbar region 10/23/2013  . Diverticulosis of colon without hemorrhage 01/22/2013  . Synovial cyst of lumbar spine 07/26/2011  . Spasms of the hands or feet 07/26/2011  . SENILE KERATOSIS 06/12/2008  . SNORING, HX OF 04/19/2007    Past Medical history, Surgical history, Family history, Social history, Allergies and Medications have been entered into the medical record, reviewed and changed as needed.    Current Meds  Medication Sig  . acetaminophen (TYLENOL) 500 MG tablet Take 1,000 mg by mouth as needed for mild pain.   . Calcium Carb-Cholecalciferol (CALCIUM 600 + D PO) Take 1 tablet by mouth every evening.  Marland Kitchen CALCIUM-MAGNESIUM-ZINC PO Take 1 tablet by mouth daily.  Marland Kitchen glucose blood (PRECISION XTRA TEST STRIPS) test strip USE TO CHECK BLOOD SUGAR ONCE DAILY DX E11.9  . Hypromellose (ARTIFICIAL TEARS OP) Place 1 drop into both eyes 2 (two) times daily.  . Multiple Vitamin (MULTIVITAMIN WITH MINERALS) TABS Take 1 tablet by mouth daily.  . NONFORMULARY OR COMPOUNDED ITEM Glucometer Museum/gallery curator XTRA- Model #[id number])  . Omega-3 Fatty Acids (FISH OIL) 1000 MG CAPS Take 1,000 mg by mouth every evening.  . Potassium 99 MG TABS Take 99 mg by mouth daily.  . Probiotic Product (RESTORA) CAPS One tab qd (Patient  taking differently: Take 1 capsule by mouth daily. One tab qd)  . Tamsulosin HCl (FLOMAX) 0.4 MG CAPS Take 0.4 mg by mouth 2 (two) times daily.   . [DISCONTINUED] aspirin EC 325 MG EC tablet Take 1 tablet (325 mg total) by mouth 2 (two) times daily. (Patient not taking: Reported on 12/30/2016)  . [DISCONTINUED] levothyroxine (SYNTHROID, LEVOTHROID) 150 MCG tablet Take 1 tablet Monday, Wednesday and Friday morning (Patient taking differently: Take 150 mcg by mouth daily before breakfast. Take 1 tablet Monday, Wednesday, Friday, Saturday, and Sunday morning)  . [DISCONTINUED] losartan (COZAAR) 50 MG tablet TAKE ONE-HALF (1/2) TABLET EVERY OTHER DAY  . [DISCONTINUED] methocarbamol (ROBAXIN)  500 MG tablet Take 1-2 tablets (500-1,000 mg total) by mouth every 6 (six) hours as needed for muscle spasms.  . [DISCONTINUED] traMADol (ULTRAM) 50 MG tablet Take 1-2 tablets (50-100 mg total) by mouth every 6 (six) hours.    Allergies:  Allergies  Allergen Reactions  . Simvastatin Other (See Comments)    MYALGIAS  . Oxycodone Other (See Comments)    Severe constipation     Review of Systems: General:   Denies fever, chills, unexplained weight loss.  Optho/Auditory:   Denies visual changes, blurred vision/LOV Respiratory:   Denies wheeze, DOE more than baseline levels.  Cardiovascular:   Denies chest pain, palpitations, new onset peripheral edema  Gastrointestinal:   Denies nausea, vomiting, diarrhea, abd pain.  Genitourinary: Denies dysuria, freq/ urgency, flank pain or discharge from genitals.  Endocrine:     Denies hot or cold intolerance, polyuria, polydipsia. Musculoskeletal:   Denies unexplained myalgias, joint swelling, unexplained arthralgias, gait problems.  Skin:  Denies new onset rash, suspicious lesions Neurological:     Denies dizziness, unexplained weakness, numbness  Psychiatric/Behavioral:   Denies mood changes, suicidal or homicidal ideations, hallucinations    Objective:   Blood pressure (!) 160/67, pulse (!) 55, height 5\' 11"  (1.803 m), weight 190 lb 1.6 oz (86.2 kg). Body mass index is 26.51 kg/m. General:  Well Developed, well nourished, appropriate for stated age.  Neuro:  Alert and oriented,  extra-ocular muscles intact  HEENT:  Normocephalic, atraumatic, neck supple, no carotid bruits appreciated  Skin:  no gross rash, warm, pink. Cardiac:  RRR, S1 S2 Respiratory:  ECTA B/L and A/P, Not using accessory muscles, speaking in full sentences- unlabored. Vascular:  Ext warm, no cyanosis apprec.; cap RF less 2 sec. Psych:  No HI/SI, judgement and insight good, Euthymic mood. Full Affect.

## 2016-12-15 NOTE — Patient Instructions (Addendum)
Discontinue your pain medications, please ask your orthopedist about medicines such as Duexis for pain as directed to get something like a Cryo/Cuff for pain all through the evening.  Use ice 37min on, 15off during daytime.    Nitelty- use a wedge pillow and cryo-cuff  Stop the Ambien stop.  Start elavil  - Continue Colace, Miralax,  Drink 1/2 wt in ounces water per day, and cont amitiza per ortho.  Rectal suppository - biscodyl 1-2 after brkfast.       Constipation, Adult Constipation is when a person has fewer bowel movements in a week than normal, has difficulty having a bowel movement, or has stools that are dry, hard, or larger than normal. Constipation may be caused by an underlying condition. It may become worse with age if a person takes certain medicines and does not take in enough fluids. Follow these instructions at home: Eating and drinking   Eat foods that have a lot of fiber, such as fresh fruits and vegetables, whole grains, and beans.  Limit foods that are high in fat, low in fiber, or overly processed, such as french fries, hamburgers, cookies, candies, and soda.  Drink enough fluid to keep your urine clear or pale yellow. General instructions  Exercise regularly or as told by your health care provider.  Go to the restroom when you have the urge to go. Do not hold it in.  Take over-the-counter and prescription medicines only as told by your health care provider. These include any fiber supplements.  Practice pelvic floor retraining exercises, such as deep breathing while relaxing the lower abdomen and pelvic floor relaxation during bowel movements.  Watch your condition for any changes.  Keep all follow-up visits as told by your health care provider. This is important. Contact a health care provider if:  You have pain that gets worse.  You have a fever.  You do not have a bowel movement after 4 days.  You vomit.  You are not hungry.  You lose  weight.  You are bleeding from the anus.  You have thin, pencil-like stools. Get help right away if:  You have a fever and your symptoms suddenly get worse.  You leak stool or have blood in your stool.  Your abdomen is bloated.  You have severe pain in your abdomen.  You feel dizzy or you faint. This information is not intended to replace advice given to you by your health care provider. Make sure you discuss any questions you have with your health care provider. Document Released: 11/14/2003 Document Revised: 09/05/2015 Document Reviewed: 08/06/2015 Elsevier Interactive Patient Education  2017 [employer].

## 2016-12-21 DIAGNOSIS — M25561 Pain in right knee: Secondary | ICD-10-CM | POA: Diagnosis not present

## 2016-12-21 DIAGNOSIS — Z96651 Presence of right artificial knee joint: Secondary | ICD-10-CM | POA: Diagnosis not present

## 2016-12-24 DIAGNOSIS — Z96651 Presence of right artificial knee joint: Secondary | ICD-10-CM | POA: Diagnosis not present

## 2016-12-24 DIAGNOSIS — M25561 Pain in right knee: Secondary | ICD-10-CM | POA: Diagnosis not present

## 2016-12-27 DIAGNOSIS — Z96651 Presence of right artificial knee joint: Secondary | ICD-10-CM | POA: Diagnosis not present

## 2016-12-27 DIAGNOSIS — M25561 Pain in right knee: Secondary | ICD-10-CM | POA: Diagnosis not present

## 2016-12-28 ENCOUNTER — Other Ambulatory Visit (INDEPENDENT_AMBULATORY_CARE_PROVIDER_SITE_OTHER): Payer: Medicare Other

## 2016-12-28 DIAGNOSIS — E063 Autoimmune thyroiditis: Secondary | ICD-10-CM | POA: Diagnosis not present

## 2016-12-28 LAB — T4, FREE: Free T4: 1.27 ng/dL (ref 0.60–1.60)

## 2016-12-28 LAB — T3, FREE: T3, Free: 3.2 pg/mL (ref 2.3–4.2)

## 2016-12-28 LAB — TSH: TSH: 3.85 u[IU]/mL (ref 0.35–4.50)

## 2016-12-29 DIAGNOSIS — Z96651 Presence of right artificial knee joint: Secondary | ICD-10-CM | POA: Diagnosis not present

## 2016-12-29 DIAGNOSIS — M25561 Pain in right knee: Secondary | ICD-10-CM | POA: Diagnosis not present

## 2016-12-30 ENCOUNTER — Encounter: Payer: Self-pay | Admitting: Endocrinology

## 2016-12-30 ENCOUNTER — Ambulatory Visit (INDEPENDENT_AMBULATORY_CARE_PROVIDER_SITE_OTHER): Payer: Medicare Other | Admitting: Endocrinology

## 2016-12-30 VITALS — BP 126/82 | HR 63 | Ht 71.0 in | Wt 191.8 lb

## 2016-12-30 DIAGNOSIS — E063 Autoimmune thyroiditis: Secondary | ICD-10-CM | POA: Diagnosis not present

## 2016-12-30 NOTE — Progress Notes (Signed)
Patient ID: Kyle Simon, male   DOB: 10-20-1932, 83 y.o.   MRN: [id number]           Referring Physician: Sharee Holster  Reason for Appointment:  Hypothyroidism,  follow-up visit    History of Present Illness:   Hypothyroidism was first diagnosed  10-15 years ago   At the time of diagnosis patient was apparently not having symptoms of  fatigue, cold sensitivity, difficulty concentrating, dry skin, weight gain He thinks his primary care physician may have done routine testing and told him that he was hypothyroid and started him on unknown dose of levothyroxine He did not start feeling any different when he started taking levothyroxine initially Also usually does not feel any different with dosage adjustments that have been made Apparently his dose was 150 g for some time but this was reduced in 06/2015 when his TSH was low normal Since then he has been taking 150 g 5 days a week and 125 g twice a week Non-insulin initial consultation he was complaining that since spring time his energy level has been not as good and he has not been able to do as much with activities as he used to   He was referred here because his TSH is tending to be high but also his free T4 was relatively higher in 7/18  Recent history: On his initial consultation since his TSH was still relatively high and he was taking the equivalent of about 142 g of levothyroxine daily he was switched to taking 150 g once daily Also he was told to not take his levothyroxine at bedtime but take it a half or before breakfast He does not feel any different with changing his thyroid medication However has been recovering from knee surgery recently No unusual fatigue and does not feel unusually cold or hot Also has lost weight from his knee surgery  TSH is quite normal at 3.8 now   Patient's weight history is as follows:  Wt Readings from Last 3 Encounters:  12/30/16 191 lb 12.8 oz (87 kg)  12/15/16 190 lb 1.6 oz (86.2 kg)    11/22/16 197 lb (89.4 kg)    Thyroid function results have been as follows:  Lab Results  Component Value Date   TSH 3.85 12/28/2016   TSH 5.860 (H) 09/15/2016   TSH 5.070 (H) 06/22/2016   TSH 2.19 02/04/2016   FREET4 1.27 12/28/2016   FREET4 1.79 (H) 09/15/2016   FREET4 1.47 06/22/2016   FREET4 1.6 02/04/2016   T3FREE 3.2 12/28/2016   T3FREE 2.7 10/17/2015    Lab Results  Component Value Date   TSH 3.85 12/28/2016   TSH 5.860 (H) 09/15/2016   TSH 5.070 (H) 06/22/2016   TSH 2.19 02/04/2016   TSH 4.31 12/16/2015   TSH 3.45 10/17/2015   TSH 0.66 07/29/2015   TSH 0.859 03/21/2015   TSH 1.33 03/20/2014     Past Medical History:  Diagnosis Date  . Arthritis    BOTH KNEES and hands;Dr.Deveshwar  . Constipation due to pain medication   . Diabetes mellitus without complication (HCC)    Type II. Uses no medication. Pt controls with diet and weight  . Enlarged prostate   . Frequent urination at night   . GERD (gastroesophageal reflux disease)   . Hyperlipidemia   . Hypertension   . Hypothyroidism   . Polio    as a child w/o complications  . Snoring    no sleep apnea  . Torn meniscus   .  Transfusion history 2012   post TKR    Past Surgical History:  Procedure Laterality Date  . BACK SURGERY  2011   hemiarthroplasty 01/11/1999 and 6  . CARDIAC CATHETERIZATION  09/2001   negative  . EYE SURGERY  2003   R&L cataract removed & IOL- 2003 & 2007,Dr.Jenkins  . INGUINAL HERNIA REPAIR  2002   right, Dr. Luan Pulling  . JOINT REPLACEMENT  2012   left knee replacement  . LIPOMA EXCISION     back  . MENISCECTOMY Bilateral    Dr. Thurston Hole  . PROSTATE SURGERY  07/2006   for urinary urgency, Dr. Isabel Caprice  . ROTATOR CUFF REPAIR Right October, 2016  . sinus & throat surgery-1995  1995  . TONSILLECTOMY    . TOTAL KNEE ARTHROPLASTY  01/11/2011   Procedure: TOTAL KNEE ARTHROPLASTY;  Surgeon: Raymon Mutton, MD;  Location: [facility] OR;  Service: Orthopedics;  Laterality: Left;  .  TOTAL KNEE ARTHROPLASTY Right 11/22/2016   Procedure: TOTAL KNEE ARTHROPLASTY;  Surgeon: Dannielle Huh, MD;  Location: [facility] OR;  Service: Orthopedics;  Laterality: Right;  . UVULECTOMY  1996   Dr. Lazarus Salines  . VASECTOMY    . WRIST SURGERY  1959   fracture- right    Family History  Problem Relation Age of Onset  . Sudden death Father        MVA  . Coronary artery disease Mother   . Emphysema Mother   . Diverticulosis Mother   . Thyroid disease Mother   . Asthma Brother   . Bone cancer Brother   . Diabetes Paternal Grandmother   . Sarcoidosis Son   . Thyroid disease Daughter   . Anesthesia problems Neg Hx   . Hypotension Neg Hx   . Malignant hyperthermia Neg Hx   . Pseudochol deficiency Neg Hx   . Colon cancer Neg Hx     Social History:  reports that he quit smoking about 50 years ago. He has a 18.00 pack-year smoking history. He has never used smokeless tobacco. He reports that he drinks about 2.4 oz of alcohol per week . He reports that he does not use drugs.  Allergies:  Allergies  Allergen Reactions  . Simvastatin Other (See Comments)    MYALGIAS  . Oxycodone Other (See Comments)    Severe constipation    Allergies as of 12/30/2016      Reactions   Simvastatin Other (See Comments)   MYALGIAS   Oxycodone Other (See Comments)   Severe constipation      Medication List       Accurate as of 12/30/16 10:44 AM. Always use your most recent med list.          acetaminophen 500 MG tablet Commonly known as:  TYLENOL Take 1,000 mg by mouth at bedtime.   ARTIFICIAL TEARS OP Place 1 drop into both eyes 2 (two) times daily.   CALCIUM 600 + D PO Take 1 tablet by mouth every evening.   CALCIUM-MAGNESIUM-ZINC PO Take 1 tablet by mouth daily.   Fish Oil 1000 MG Caps Take 1,000 mg by mouth every evening.   FLOMAX 0.4 MG Caps capsule Generic drug:  tamsulosin Take 0.4 mg by mouth 2 (two) times daily.   glucose blood test strip Commonly known as:  PRECISION XTRA TEST  STRIPS USE TO CHECK BLOOD SUGAR ONCE DAILY DX E11.9   ibuprofen 600 MG tablet Commonly known as:  ADVIL,MOTRIN Take 600 mg by mouth every 8 (eight) hours as needed.  levothyroxine 150 MCG tablet Commonly known as:  SYNTHROID, LEVOTHROID Take 1 tablet Monday, Wednesday and Friday morning   losartan 50 MG tablet Commonly known as:  COZAAR TAKE ONE-HALF (1/2) TABLET EVERY OTHER DAY   methocarbamol 500 MG tablet Commonly known as:  ROBAXIN Take 1-2 tablets (500-1,000 mg total) by mouth every 6 (six) hours as needed for muscle spasms.   multivitamin with minerals Tabs tablet Take 1 tablet by mouth daily.   NONFORMULARY OR COMPOUNDED ITEM Glucometer (Precision XTRA- Model #[id number])   Potassium 99 MG Tabs Take 99 mg by mouth daily.   RESTORA Caps One tab qd          Review of Systems      He had a fall a couple of days ago and is using a walker         Examination:    BP 126/82   Pulse 63   Ht 5\' 11"  (1.803 m)   Wt 191 lb 12.8 oz (87 kg)   SpO2 97%   BMI 26.75 kg/m   Exam not indicated today   Assessment:  HYPOTHYROIDISM, likely autoimmune with no associated goiter Patient is doing well with now a new regimen of 150 g levothyroxine before breakfast TSH is back to normal and adequate considering his age  PLAN:   Since he has excellent blood levels of free T4, TSH and doing well subjectively with 150 mg levothyroxine on a daily basis he will continue this He will call if he has any unusual change in his energy level or complaints of cold or heat intolerance or palpitations  Follow-up in 4 months  Crixus Mcaulay 12/30/2016, 10:44 AM   Consultation note copy sent to the PCP  Note: This office note was prepared with Dragon voice recognition system technology. Any transcriptional errors that result from this process are unintentional.

## 2016-12-31 DIAGNOSIS — Z96651 Presence of right artificial knee joint: Secondary | ICD-10-CM | POA: Diagnosis not present

## 2016-12-31 DIAGNOSIS — M25561 Pain in right knee: Secondary | ICD-10-CM | POA: Diagnosis not present

## 2017-01-04 DIAGNOSIS — M25561 Pain in right knee: Secondary | ICD-10-CM | POA: Diagnosis not present

## 2017-01-04 DIAGNOSIS — Z96651 Presence of right artificial knee joint: Secondary | ICD-10-CM | POA: Diagnosis not present

## 2017-01-07 DIAGNOSIS — Z96651 Presence of right artificial knee joint: Secondary | ICD-10-CM | POA: Diagnosis not present

## 2017-01-07 DIAGNOSIS — M25561 Pain in right knee: Secondary | ICD-10-CM | POA: Diagnosis not present

## 2017-01-11 DIAGNOSIS — M25561 Pain in right knee: Secondary | ICD-10-CM | POA: Diagnosis not present

## 2017-01-11 DIAGNOSIS — Z96651 Presence of right artificial knee joint: Secondary | ICD-10-CM | POA: Diagnosis not present

## 2017-01-14 DIAGNOSIS — Z96651 Presence of right artificial knee joint: Secondary | ICD-10-CM | POA: Diagnosis not present

## 2017-01-14 DIAGNOSIS — M25561 Pain in right knee: Secondary | ICD-10-CM | POA: Diagnosis not present

## 2017-01-18 ENCOUNTER — Other Ambulatory Visit: Payer: Self-pay | Admitting: Family Medicine

## 2017-01-18 DIAGNOSIS — E038 Other specified hypothyroidism: Secondary | ICD-10-CM

## 2017-01-18 NOTE — Progress Notes (Signed)
Office Visit Note  Patient: Kyle Simon             Date of Birth: December 16, 1932           MRN: [id number]             PCP: Thomasene Lot, DO Referring: Thomasene Lot, DO Visit Date: 01/28/2017 Occupation: @GUAROCC @    Subjective:  Pain in right shoulder and knees.  History of Present Illness: Kyle Simon is a 83 y.o. male with history of osteoarthritis, disc disease and osteoporosis. According to patient he underwent right total knee replacement in September 2018 by Dr. Valentina Gu. He states that he's had gradual recovery. He continues to have some discomfort in his bilateral knee joints. He had right rotator cuff tear repair about 2 years ago which still causes discomfort. He continues to have some lower back pain. He decided not to start on any treatment for osteoporosis during the last visit. He continues to have generalized pain and discomfort. He takes ibuprofen only on when necessary basis.  Activities of Daily Living:  Patient reports morning stiffness for 0 minutes.   Patient Reports nocturnal pain.  Difficulty dressing/grooming: Reports Difficulty climbing stairs: Reports Difficulty getting out of chair: Reports Difficulty using hands for taps, buttons, cutlery, and/or writing: Reports   Review of Systems  Constitutional: Positive for fatigue. Negative for night sweats and weakness.  HENT: Negative.  Negative for mouth sores, mouth dryness and nose dryness.   Eyes: Negative.  Negative for redness and dryness.  Respiratory: Negative.  Negative for shortness of breath and difficulty breathing.   Cardiovascular: Negative for chest pain, palpitations, hypertension, irregular heartbeat and swelling in legs/feet.  Gastrointestinal: Negative for constipation and diarrhea.  Endocrine: Negative.  Negative for increased urination.  Musculoskeletal: Positive for arthralgias and joint pain. Negative for joint swelling, myalgias, muscle weakness, morning stiffness, muscle  tenderness and myalgias.  Skin: Negative.  Negative for color change, rash, hair loss, nodules/bumps, skin tightness, ulcers and sensitivity to sunlight.  Allergic/Immunologic: Negative for susceptible to infections.  Neurological: Negative for dizziness, fainting, memory loss and night sweats.  Hematological: Negative for swollen glands.  Psychiatric/Behavioral: Positive for sleep disturbance. Negative for depressed mood. The patient is not nervous/anxious.     PMFS History:  Patient Active Problem List   Diagnosis Date Noted  . H/O total knee replacement, bilateral 11/22/2016  . ETD (Eustachian tube dysfunction), right 09/15/2016  . Strain of masseter muscle 09/15/2016  . Trochanteric bursitis of right hip 09/07/2016  . Age-related osteoporosis without current pathological fracture 07/16/2016  . History of lumbar fusion 07/16/2016  . History of total knee arthroplasty, left 07/16/2016  . Cough 01/04/2016  . High risk medications (not anticoagulants) long-term use 01/04/2016  . Hypomagnesemia 12/29/2015  . h/o Hyponatremia 12/29/2015  . Cerumen impaction 08/25/2015  . Diabetes mellitus without complication (HCC) 08/05/2015  . Flatulence symptom 07/25/2015  . Diarrhea 06/21/2015  . Chronic rhinitis 11/01/2014  . Essential hypertension 03/20/2014  . Spondylolisthesis of lumbar region 10/23/2013  . Diverticulosis of colon without hemorrhage 01/22/2013  . B12 deficiency anemia 01/22/2013  . Anemia, unspecified 08/13/2012  . Synovial cyst of lumbar spine 07/26/2011  . Spasms of the hands or feet 07/26/2011  . GERD (gastroesophageal reflux disease) 03/26/2011  . SENILE KERATOSIS 06/12/2008  . Hyperlipidemia 04/19/2007  . SNORING, HX OF 04/19/2007  . BPH (benign prostatic hyperplasia) 04/18/2007  . Hypothyroidism 04/20/2006    Past Medical History:  Diagnosis Date  . Arthritis  BOTH KNEES and hands;Dr.Naveena Eyman  . Constipation due to pain medication   . Diabetes mellitus  without complication (HCC)    Type II. Uses no medication. Pt controls with diet and weight  . Enlarged prostate   . Frequent urination at night   . GERD (gastroesophageal reflux disease)   . Hyperlipidemia   . Hypertension   . Hypothyroidism   . Polio    as a child w/o complications  . Snoring    no sleep apnea  . Torn meniscus   . Transfusion history 2012   post TKR    Family History  Problem Relation Age of Onset  . Sudden death Father        MVA  . Coronary artery disease Mother   . Emphysema Mother   . Diverticulosis Mother   . Thyroid disease Mother   . Asthma Brother   . Bone cancer Brother   . Diabetes Paternal Grandmother   . Sarcoidosis Son   . Thyroid disease Daughter   . Anesthesia problems Neg Hx   . Hypotension Neg Hx   . Malignant hyperthermia Neg Hx   . Pseudochol deficiency Neg Hx   . Colon cancer Neg Hx    Past Surgical History:  Procedure Laterality Date  . BACK SURGERY  2011   hemiarthroplasty 01/11/1999 and 6  . CARDIAC CATHETERIZATION  09/2001   negative  . EYE SURGERY  2003   R&L cataract removed & IOL- 2003 & 2007,Dr.Jenkins  . INGUINAL HERNIA REPAIR  2002   right, Dr. Luan Pulling  . JOINT REPLACEMENT  2012   left knee replacement  . LIPOMA EXCISION     back  . MENISCECTOMY Bilateral    Dr. Thurston Hole  . PROSTATE SURGERY  07/2006   for urinary urgency, Dr. Isabel Caprice  . ROTATOR CUFF REPAIR Right October, 2016  . sinus & throat surgery-1995  1995  . TONSILLECTOMY    . TOTAL KNEE ARTHROPLASTY  01/11/2011   Procedure: TOTAL KNEE ARTHROPLASTY;  Surgeon: Raymon Mutton, MD;  Location: [facility] OR;  Service: Orthopedics;  Laterality: Left;  . TOTAL KNEE ARTHROPLASTY Right 11/22/2016   Procedure: TOTAL KNEE ARTHROPLASTY;  Surgeon: Dannielle Huh, MD;  Location: [facility] OR;  Service: Orthopedics;  Laterality: Right;  . UVULECTOMY  1996   Dr. Lazarus Salines  . VASECTOMY    . WRIST SURGERY  1959   fracture- right   Social History   Social History Narrative  . Not  on file     Objective: Vital Signs: BP (!) 144/79 (BP Location: Left Arm, Patient Position: Sitting, Cuff Size: Normal)   Pulse (!) 58   Ht 5\' 11"  (1.803 m)   Wt 192 lb (87.1 kg)   BMI 26.78 kg/m    Physical Exam  Constitutional: He is oriented to person, place, and time. He appears well-developed and well-nourished.  HENT:  Head: Normocephalic and atraumatic.  Eyes: Conjunctivae and EOM are normal. Pupils are equal, round, and reactive to light.  Neck: Normal range of motion. Neck supple.  Cardiovascular: Normal rate, regular rhythm and normal heart sounds.  Pulmonary/Chest: Effort normal and breath sounds normal.  Abdominal: Soft. Bowel sounds are normal.  Neurological: He is alert and oriented to person, place, and time.  Skin: Skin is warm and dry. Capillary refill takes less than 2 seconds.  Psychiatric: He has a normal mood and affect. His behavior is normal.  Nursing note and vitals reviewed.    Musculoskeletal Exam: C-spine limited range of motion. He  has thoracic kyphosis. He is limited range of motion of's lumbar spine with some discomfort. He has good range of motion of her shoulder joints with discomfort in his right shoulder. Elbow joints wrist joints with good range of motion. He has some DIP PIP thickening in his hands consistent with osteoarthritis. He has bilateral total knee replacement with warmth and right knee joint.  CDAI Exam: No CDAI exam completed.    Investigation: No additional findings. CBC Latest Ref Rng & Units 11/23/2016 11/22/2016 11/16/2016  WBC 4.0 - 10.5 K/uL 13.6(H) - 5.1  Hemoglobin 13.0 - 17.0 g/dL 10.5(L) 11.9(L) 12.5(L)  Hematocrit 39.0 - 52.0 % 32.1(L) 35.0(L) 37.1(L)  Platelets 150 - 400 K/uL 226 - 216   CMP Latest Ref Rng & Units 11/23/2016 11/22/2016 11/16/2016  Glucose 65 - 99 mg/dL 161(W) 960(A) 540(J)  BUN 6 - 20 mg/dL 14 - 14  Creatinine 8.11 - 1.24 mg/dL 9.14 - 7.82  Sodium 956 - 145 mmol/L 130(L) 132(L) 129(L)  Potassium 3.5 -  5.1 mmol/L 4.8 4.4 4.6  Chloride 101 - 111 mmol/L 99(L) - 100(L)  CO2 22 - 32 mmol/L 25 - 22  Calcium 8.9 - 10.3 mg/dL 2.1(H) - 9.3  Total Protein 6.5 - 8.1 g/dL - - 6.9  Total Bilirubin 0.3 - 1.2 mg/dL - - 0.6  Alkaline Phos 38 - 126 U/L - - 64  AST 15 - 41 U/L - - 19  ALT 17 - 63 U/L - - 18    Imaging: No results found.  Speciality Comments: No specialty comments available.    Procedures:  No procedures performed Allergies: Simvastatin and Oxycodone   Assessment / Plan:     Visit Diagnoses: Age-related osteoporosis without current pathological fracture - 05/27/2015 DEXA T score -2.5 right radius. Patient does not want to take any additional medications at this time. Use of calcium and vitamin D was discussed. I will also advised to get vitamin D level with his next labs. Regular exercise and muscle strengthening was discussed.  Chronic right shoulder pain - Right rotator cuff tear repair 2016: Chronic pain  Spondylolisthesis of lumbar region - Status post lumbar fusion: Chronic pain  H/O total knee replacement, bilateral - Right total knee replacement 11/22/2016 by Dr. Valentina Gu, left total knee replacement 2012 by Dr. Valentina Gu. He is still recovering from his right total knee replacement. He is been going to physical therapy.  Other medical problems are listed as follows:  History of diabetes mellitus  History of anemia  History of gastroesophageal reflux (GERD)  History of hyperlipidemia  History of hypertension    Orders: No orders of the defined types were placed in this encounter.  No orders of the defined types were placed in this encounter.   Face-to-face time spent with patient was 30 minutes. Greater than 50% of time was spent in counseling and coordination of care.  Follow-Up Instructions: Return in about 6 months (around 07/28/2017) for OA DDD OP.   Pollyann Savoy, MD  Note - This record has been created using Animal nutritionist.  Chart creation errors have  been sought, but may not always  have been located. Such creation errors do not reflect on  the standard of medical care.

## 2017-01-27 DIAGNOSIS — M25561 Pain in right knee: Secondary | ICD-10-CM | POA: Diagnosis not present

## 2017-01-27 DIAGNOSIS — Z96651 Presence of right artificial knee joint: Secondary | ICD-10-CM | POA: Diagnosis not present

## 2017-01-28 ENCOUNTER — Ambulatory Visit (INDEPENDENT_AMBULATORY_CARE_PROVIDER_SITE_OTHER): Payer: Medicare Other | Admitting: Rheumatology

## 2017-01-28 ENCOUNTER — Encounter: Payer: Self-pay | Admitting: Rheumatology

## 2017-01-28 VITALS — BP 144/79 | HR 58 | Ht 71.0 in | Wt 192.0 lb

## 2017-01-28 DIAGNOSIS — Z96653 Presence of artificial knee joint, bilateral: Secondary | ICD-10-CM

## 2017-01-28 DIAGNOSIS — M25511 Pain in right shoulder: Secondary | ICD-10-CM | POA: Diagnosis not present

## 2017-01-28 DIAGNOSIS — G8929 Other chronic pain: Secondary | ICD-10-CM

## 2017-01-28 DIAGNOSIS — Z8719 Personal history of other diseases of the digestive system: Secondary | ICD-10-CM | POA: Diagnosis not present

## 2017-01-28 DIAGNOSIS — M810 Age-related osteoporosis without current pathological fracture: Secondary | ICD-10-CM | POA: Diagnosis not present

## 2017-01-28 DIAGNOSIS — M4316 Spondylolisthesis, lumbar region: Secondary | ICD-10-CM

## 2017-01-28 DIAGNOSIS — Z8639 Personal history of other endocrine, nutritional and metabolic disease: Secondary | ICD-10-CM | POA: Diagnosis not present

## 2017-01-28 DIAGNOSIS — Z862 Personal history of diseases of the blood and blood-forming organs and certain disorders involving the immune mechanism: Secondary | ICD-10-CM

## 2017-01-28 DIAGNOSIS — Z8679 Personal history of other diseases of the circulatory system: Secondary | ICD-10-CM | POA: Diagnosis not present

## 2017-02-01 DIAGNOSIS — M25561 Pain in right knee: Secondary | ICD-10-CM | POA: Diagnosis not present

## 2017-02-01 DIAGNOSIS — Z96651 Presence of right artificial knee joint: Secondary | ICD-10-CM | POA: Diagnosis not present

## 2017-02-10 DIAGNOSIS — L84 Corns and callosities: Secondary | ICD-10-CM | POA: Diagnosis not present

## 2017-02-10 DIAGNOSIS — L603 Nail dystrophy: Secondary | ICD-10-CM | POA: Diagnosis not present

## 2017-02-10 DIAGNOSIS — I739 Peripheral vascular disease, unspecified: Secondary | ICD-10-CM | POA: Diagnosis not present

## 2017-02-11 DIAGNOSIS — H353131 Nonexudative age-related macular degeneration, bilateral, early dry stage: Secondary | ICD-10-CM | POA: Diagnosis not present

## 2017-02-11 DIAGNOSIS — H11153 Pinguecula, bilateral: Secondary | ICD-10-CM | POA: Diagnosis not present

## 2017-02-11 DIAGNOSIS — H04123 Dry eye syndrome of bilateral lacrimal glands: Secondary | ICD-10-CM | POA: Diagnosis not present

## 2017-02-11 DIAGNOSIS — H40013 Open angle with borderline findings, low risk, bilateral: Secondary | ICD-10-CM | POA: Diagnosis not present

## 2017-02-11 DIAGNOSIS — H11423 Conjunctival edema, bilateral: Secondary | ICD-10-CM | POA: Diagnosis not present

## 2017-02-11 DIAGNOSIS — H40053 Ocular hypertension, bilateral: Secondary | ICD-10-CM | POA: Diagnosis not present

## 2017-02-11 DIAGNOSIS — H18413 Arcus senilis, bilateral: Secondary | ICD-10-CM | POA: Diagnosis not present

## 2017-02-11 DIAGNOSIS — H40003 Preglaucoma, unspecified, bilateral: Secondary | ICD-10-CM | POA: Diagnosis not present

## 2017-02-11 DIAGNOSIS — H43811 Vitreous degeneration, right eye: Secondary | ICD-10-CM | POA: Diagnosis not present

## 2017-02-11 DIAGNOSIS — Z9849 Cataract extraction status, unspecified eye: Secondary | ICD-10-CM | POA: Diagnosis not present

## 2017-02-11 DIAGNOSIS — Z961 Presence of intraocular lens: Secondary | ICD-10-CM | POA: Diagnosis not present

## 2017-02-15 DIAGNOSIS — Z96651 Presence of right artificial knee joint: Secondary | ICD-10-CM | POA: Diagnosis not present

## 2017-02-15 DIAGNOSIS — M25561 Pain in right knee: Secondary | ICD-10-CM | POA: Diagnosis not present

## 2017-02-17 DIAGNOSIS — M25461 Effusion, right knee: Secondary | ICD-10-CM | POA: Diagnosis not present

## 2017-02-17 DIAGNOSIS — Z96651 Presence of right artificial knee joint: Secondary | ICD-10-CM | POA: Diagnosis not present

## 2017-02-17 DIAGNOSIS — T8484XD Pain due to internal orthopedic prosthetic devices, implants and grafts, subsequent encounter: Secondary | ICD-10-CM | POA: Diagnosis not present

## 2017-02-24 DIAGNOSIS — G43B0 Ophthalmoplegic migraine, not intractable: Secondary | ICD-10-CM | POA: Diagnosis not present

## 2017-03-02 DIAGNOSIS — M26621 Arthralgia of right temporomandibular joint: Secondary | ICD-10-CM | POA: Insufficient documentation

## 2017-03-02 DIAGNOSIS — L299 Pruritus, unspecified: Secondary | ICD-10-CM | POA: Diagnosis not present

## 2017-03-02 DIAGNOSIS — H9313 Tinnitus, bilateral: Secondary | ICD-10-CM | POA: Diagnosis not present

## 2017-03-02 DIAGNOSIS — H608X3 Other otitis externa, bilateral: Secondary | ICD-10-CM | POA: Insufficient documentation

## 2017-03-02 DIAGNOSIS — H6123 Impacted cerumen, bilateral: Secondary | ICD-10-CM | POA: Diagnosis not present

## 2017-03-02 DIAGNOSIS — H9113 Presbycusis, bilateral: Secondary | ICD-10-CM | POA: Insufficient documentation

## 2017-03-11 DIAGNOSIS — M25561 Pain in right knee: Secondary | ICD-10-CM | POA: Diagnosis not present

## 2017-03-11 DIAGNOSIS — Z96651 Presence of right artificial knee joint: Secondary | ICD-10-CM | POA: Diagnosis not present

## 2017-03-14 DIAGNOSIS — M25561 Pain in right knee: Secondary | ICD-10-CM | POA: Diagnosis not present

## 2017-03-14 DIAGNOSIS — Z96651 Presence of right artificial knee joint: Secondary | ICD-10-CM | POA: Diagnosis not present

## 2017-03-16 DIAGNOSIS — M25561 Pain in right knee: Secondary | ICD-10-CM | POA: Diagnosis not present

## 2017-03-16 DIAGNOSIS — Z96651 Presence of right artificial knee joint: Secondary | ICD-10-CM | POA: Diagnosis not present

## 2017-03-17 DIAGNOSIS — H903 Sensorineural hearing loss, bilateral: Secondary | ICD-10-CM | POA: Diagnosis not present

## 2017-03-18 DIAGNOSIS — M25561 Pain in right knee: Secondary | ICD-10-CM | POA: Diagnosis not present

## 2017-03-18 DIAGNOSIS — Z96651 Presence of right artificial knee joint: Secondary | ICD-10-CM | POA: Diagnosis not present

## 2017-03-23 DIAGNOSIS — M25561 Pain in right knee: Secondary | ICD-10-CM | POA: Diagnosis not present

## 2017-03-23 DIAGNOSIS — Z96651 Presence of right artificial knee joint: Secondary | ICD-10-CM | POA: Diagnosis not present

## 2017-03-25 DIAGNOSIS — Z96651 Presence of right artificial knee joint: Secondary | ICD-10-CM | POA: Diagnosis not present

## 2017-03-25 DIAGNOSIS — M25561 Pain in right knee: Secondary | ICD-10-CM | POA: Diagnosis not present

## 2017-04-07 DIAGNOSIS — Z96651 Presence of right artificial knee joint: Secondary | ICD-10-CM | POA: Diagnosis not present

## 2017-04-07 DIAGNOSIS — M25561 Pain in right knee: Secondary | ICD-10-CM | POA: Diagnosis not present

## 2017-04-13 DIAGNOSIS — Z471 Aftercare following joint replacement surgery: Secondary | ICD-10-CM | POA: Diagnosis not present

## 2017-04-13 DIAGNOSIS — Z96651 Presence of right artificial knee joint: Secondary | ICD-10-CM | POA: Diagnosis not present

## 2017-04-14 ENCOUNTER — Ambulatory Visit (INDEPENDENT_AMBULATORY_CARE_PROVIDER_SITE_OTHER): Payer: Medicare Other | Admitting: Family Medicine

## 2017-04-14 ENCOUNTER — Encounter: Payer: Self-pay | Admitting: Family Medicine

## 2017-04-14 VITALS — BP 106/67 | HR 66 | Ht 71.0 in | Wt 190.7 lb

## 2017-04-14 DIAGNOSIS — Z96653 Presence of artificial knee joint, bilateral: Secondary | ICD-10-CM | POA: Insufficient documentation

## 2017-04-14 DIAGNOSIS — I10 Essential (primary) hypertension: Secondary | ICD-10-CM

## 2017-04-14 DIAGNOSIS — E1159 Type 2 diabetes mellitus with other circulatory complications: Secondary | ICD-10-CM

## 2017-04-14 DIAGNOSIS — Z96651 Presence of right artificial knee joint: Secondary | ICD-10-CM | POA: Insufficient documentation

## 2017-04-14 DIAGNOSIS — E039 Hypothyroidism, unspecified: Secondary | ICD-10-CM | POA: Diagnosis not present

## 2017-04-14 DIAGNOSIS — N183 Chronic kidney disease, stage 3 unspecified: Secondary | ICD-10-CM | POA: Insufficient documentation

## 2017-04-14 DIAGNOSIS — E119 Type 2 diabetes mellitus without complications: Secondary | ICD-10-CM

## 2017-04-14 DIAGNOSIS — Z79899 Other long term (current) drug therapy: Secondary | ICD-10-CM | POA: Diagnosis not present

## 2017-04-14 DIAGNOSIS — E1169 Type 2 diabetes mellitus with other specified complication: Secondary | ICD-10-CM | POA: Diagnosis not present

## 2017-04-14 DIAGNOSIS — E785 Hyperlipidemia, unspecified: Secondary | ICD-10-CM

## 2017-04-14 DIAGNOSIS — I152 Hypertension secondary to endocrine disorders: Secondary | ICD-10-CM

## 2017-04-14 LAB — POCT UA - MICROALBUMIN
Albumin/Creatinine Ratio, Urine, POC: <30
Creatinine, POC: 100 mg/dL
Microalbumin Ur, POC: 10 mg/L

## 2017-04-14 LAB — POCT GLYCOSYLATED HEMOGLOBIN (HGB A1C): Hemoglobin A1C: 6.5

## 2017-04-14 MED ORDER — LANCETS ULTRA THIN 30G MISC: 6 refills | Status: DC

## 2017-04-14 NOTE — Progress Notes (Signed)
Impression and Recommendations:    1. Diabetes mellitus without complication ([place])   2. Hypertension associated with diabetes ([place])   3. Hyperlipidemia associated with type 2 diabetes mellitus ([place])   4. DM assoc with CKD (chronic kidney disease), stage III ([place])   5. High risk medications (not anticoagulants) long-term use   6. Hypothyroidism, unspecified type   7. Total knee replacement status, right      1. DM2: A1c today is 6.5. Blood sugars are well-controlled at home. Dietary and exercise guidelines discussed.  2. Hypothyroidism- pt is tolerating his meds well without complication. Continue as prescribed.  1. Total knee replacement status, right- Apply ice to the affected knee 15-20 minutes, every hour, 3-4 times a day. Walk on a forgiving surface.  2. HLD with type 2 DM-continue meds, LDL at goal.  3. High risk med-  continue to monitor closely.   4. HTN- well controlled, cont meds  -For your aviation physical exam, we will defer you to be seen by Mina Marble, NP, in office.   -Follow up in 4 months for routine chronic care and health management.   Education and routine counseling performed. Handouts provided.  Orders Placed This Encounter  Procedures  . POCT glycosylated hemoglobin (Hb A1C)  . POCT UA - Microalbumin    Meds ordered this encounter  Medications  . LANCETS ULTRA THIN 30G MISC    Sig: Patient is to use to check blood glucose in the AM fasting and 2 hours after largest meal.    Dispense:  100 each    Refill:  6    Return for diabetes and blood pressure follow up every 4 mo.   The patient was counseled, risk factors were discussed, anticipatory guidance given.  Gross side effects, risk and benefits, and alternatives of medications discussed with patient.  Patient is aware that all medications have potential side effects and we are unable to predict every side effect or drug-drug interaction that may occur.  Expresses verbal understanding  and consents to current therapy plan and treatment regimen.  Please see AVS handed out to patient at the end of our visit for further patient instructions/ counseling done pertaining to today's office visit.    Note: This document was prepared using Dragon voice recognition software and may include unintentional dictation errors.  This document serves as a record of services personally performed by Mellody Dance, DO. It was created on her behalf by Mayer Masker, a trained medical scribe. The creation of this record is based on the scribe's personal observations and the provider's statements to them.   I have reviewed the above medical documentation for accuracy and completeness and I concur.  Mellody Dance 05/07/17 5:00 PM    Subjective:    Chief Complaint  Patient presents with  . Follow-up     Kyle Simon is a 84 y.o. male who presents to [place] [facility] today for Diabetes Management and R knee pain.  DM HPI: A1c today is 6.5 -  He has been working on diet and exercise for diabetes  Pt is currently maintained on the following medications for diabetes:   see med list today  Medication compliance - NA- not on meds.  Home glucose readings range: around 104s. Well-controlled at home.    Denies polyuria/polydipsia. Denies hypo/ hyperglycemia symptoms  - He denies new onset of: chest pain, exercise intolerance, shortness of breath, dizziness, visual changes, headache, lower extremity swelling or claudication.  Last diabetic eye exam was recent.   He denies leg swelling.  Physical: He is also requesting an FAA physical.   R Knee: pt had a successful knee replacement surgery and was just released by his orthopedist recently. He takes 600mg  ibuprofen for an aching pain. He stopped icing his knee 1 month ago.   Lab Results  Component Value Date   HMDIABEYEEXA No Retinopathy 01/11/2016    Foot exam- UTD  Last A1C in the office was:  Lab  Results  Component Value Date   HGBA1C 6.5 04/14/2017   HGBA1C 6.4 (H) 09/15/2016   HGBA1C 6.4 06/22/2016    Lab Results  Component Value Date   MICROALBUR 10 04/14/2017   LDLCALC 98 09/15/2016   CREATININE 1.00 11/23/2016      Last 3 blood pressure readings in our office are as follows: BP Readings from Last 3 Encounters:  04/29/17 124/78  04/22/17 (!) 149/81  04/14/17 106/67    BMI Readings from Last 3 Encounters:  04/29/17 26.83 kg/m  04/22/17 26.50 kg/m  04/14/17 26.60 kg/m     Patient Care Team    Relationship Specialty Notifications Start End  Mellody Dance, DO PCP - General Family Medicine  08/05/15   Rana Snare, MD Consulting Physician Urology  11/10/15   Lavonna Monarch, MD Consulting Physician Dermatology  5/95/63   Jodi Marble, MD Consulting Physician Otolaryngology  11/10/15   Calton Dach, MD Referring Physician Optometry  11/10/15   Inocencio Homes, [place] Consulting Physician Podiatry  11/10/15   Bo Merino, MD Consulting Physician Rheumatology  11/10/15   Vickey Huger, MD Consulting Physician Orthopedic Surgery  11/10/15   Erline Levine, MD Consulting Physician Neurosurgery  11/10/15   Deboraha Sprang, MD Consulting Physician Cardiology  01/04/16    Comment: seen '12 last  Elayne Snare, MD Consulting Physician Endocrinology  04/14/17   Kathrynn Ducking, MD Consulting Physician Neurology  8/75/64   Jodi Marble, MD Consulting Physician Otolaryngology  04/22/17      Patient Active Problem List   Diagnosis Date Noted  . DM assoc with CKD (chronic kidney disease), stage III ([place]) 04/14/2017    Priority: High  . Diabetes mellitus without complication ([place]) 33/29/5188    Priority: High  . Hypertension associated with diabetes ([place]) 03/20/2014    Priority: High  . Hyperlipidemia associated with type 2 diabetes mellitus ([place]) 04/19/2007    Priority: High  . H/O total knee replacement, bilateral 11/22/2016    Priority: Medium  .  Hypomagnesemia 12/29/2015    Priority: Medium  . h/o Hyponatremia 12/29/2015    Priority: Medium  . BPH (benign prostatic hyperplasia) 04/18/2007    Priority: Medium  . Hypothyroidism 04/20/2006    Priority: Medium  . Age-related osteoporosis without current pathological fracture 07/16/2016    Priority: Low  . Flatulence symptom 07/25/2015    Priority: Low  . Diarrhea 06/21/2015    Priority: Low  . B12 deficiency anemia 01/22/2013    Priority: Low  . Anemia, unspecified 08/13/2012    Priority: Low  . GERD (gastroesophageal reflux disease) 03/26/2011    Priority: Low  . Total knee replacement status, right 04/14/2017  . Arthralgia of right temporomandibular joint 03/02/2017  . Chronic eczematous otitis externa of both ears 03/02/2017  . Presbycusis of both ears 03/02/2017  . ETD (Eustachian tube dysfunction), right 09/15/2016  . Strain of masseter muscle 09/15/2016  . Trochanteric bursitis of right hip 09/07/2016  . History of lumbar fusion 07/16/2016  . History  of total knee arthroplasty, left 07/16/2016  . Cough 01/04/2016  . High risk medications (not anticoagulants) long-term use 01/04/2016  . Cerumen impaction 08/25/2015  . Chronic rhinitis 11/01/2014  . Spondylolisthesis of lumbar region 10/23/2013  . Diverticulosis of colon without hemorrhage 01/22/2013  . Synovial cyst of lumbar spine 07/26/2011  . Spasms of the hands or feet 07/26/2011  . SENILE KERATOSIS 06/12/2008  . SNORING, HX OF 04/19/2007     Past Medical History:  Diagnosis Date  . Arthritis    BOTH KNEES and hands;Dr.Deveshwar  . Constipation due to pain medication   . Diabetes mellitus without complication ([place])    Type II. Uses no medication. Pt controls with diet and weight  . Enlarged prostate   . Frequent urination at night   . GERD (gastroesophageal reflux disease)   . Hyperlipidemia   . Hypertension   . Hypothyroidism   . Polio    as a child w/o complications  . Snoring    no sleep  apnea  . Torn meniscus   . Transfusion history 2012   post TKR     Past Surgical History:  Procedure Laterality Date  . BACK SURGERY  2011   hemiarthroplasty 01/11/1999 and 6  . CARDIAC CATHETERIZATION  09/2001   negative  . EYE SURGERY  2003   R&L cataract removed & IOL- 2003 & 2007,Dr.Jenkins  . INGUINAL HERNIA REPAIR  2002   right, Dr. Truitt Leep  . JOINT REPLACEMENT  2012   left knee replacement  . LIPOMA EXCISION     back  . MENISCECTOMY Bilateral    Dr. Noemi Chapel  . PROSTATE SURGERY  07/2006   for urinary urgency, Dr. Risa Grill  . ROTATOR CUFF REPAIR Right October, 2016  . sinus & throat surgery-1995  1995  . TONSILLECTOMY    . TOTAL KNEE ARTHROPLASTY  01/11/2011   Procedure: TOTAL KNEE ARTHROPLASTY;  Surgeon: Rudean Haskell, MD;  Location: [facility];  Service: Orthopedics;  Laterality: Left;  . TOTAL KNEE ARTHROPLASTY Right 11/22/2016   Procedure: TOTAL KNEE ARTHROPLASTY;  Surgeon: Vickey Huger, MD;  Location: [facility];  Service: Orthopedics;  Laterality: Right;  . UVULECTOMY  3810   Dr. Erik Obey  . VASECTOMY    . WRIST SURGERY  1959   fracture- right     Family History  Problem Relation Age of Onset  . Sudden death Father        MVA  . Coronary artery disease Mother   . Emphysema Mother   . Diverticulosis Mother   . Thyroid disease Mother   . Asthma Brother   . Bone cancer Brother   . Diabetes Paternal Grandmother   . Sarcoidosis Son   . Thyroid disease Daughter   . Anesthesia problems Neg Hx   . Hypotension Neg Hx   . Malignant hyperthermia Neg Hx   . Pseudochol deficiency Neg Hx   . Colon cancer Neg Hx      Social History   Substance and Sexual Activity  Drug Use No  ,  Social History   Substance and Sexual Activity  Alcohol Use Yes  . Alcohol/week: 2.4 oz  . Types: 4 Shots of liquor per week   Comment: social  ,  Social History   Tobacco Use  Smoking Status Former Smoker  . Packs/day: 1.00  . Years: 18.00  . Pack years: 18.00  . Last  attempt to quit: 03/01/1966  . Years since quitting: 51.2  Smokeless Tobacco Never Used  ,  Current Outpatient Medications on File Prior to Visit  Medication Sig Dispense Refill  . acetaminophen (TYLENOL) 500 MG tablet Take 1,000 mg by mouth as needed for mild pain.     . Calcium Carb-Cholecalciferol (CALCIUM 600 + D PO) Take 1 tablet by mouth every evening.    Marland Kitchen CALCIUM-MAGNESIUM-ZINC PO Take 1 tablet by mouth daily.    Marland Kitchen glucose blood (PRECISION XTRA TEST STRIPS) test strip USE TO CHECK BLOOD SUGAR ONCE DAILY DX E11.9 100 each 2  . Hypromellose (ARTIFICIAL TEARS OP) Place 1 drop into both eyes 2 (two) times daily.    Marland Kitchen levothyroxine (SYNTHROID, LEVOTHROID) 150 MCG tablet TAKE 1 TABLET ON MONDAY, WEDNESDAY AND FRIDAY MORNING (Patient taking differently: Take 150 mcg by mouth daily. ) 36 tablet 1  . losartan (COZAAR) 50 MG tablet TAKE ONE-HALF (1/2) TABLET EVERY OTHER DAY 90 tablet 1  . Multiple Vitamin (MULTIVITAMIN WITH MINERALS) TABS Take 1 tablet by mouth daily.    . NONFORMULARY OR COMPOUNDED ITEM Glucometer (Precision XTRA- Model #[id number]) 1 each 0  . Omega-3 Fatty Acids (FISH OIL) 1000 MG CAPS Take 1,000 mg by mouth every evening.    . Potassium 99 MG TABS Take 99 mg by mouth daily.    . Probiotic Product (RESTORA) CAPS One tab qd (Patient taking differently: Take 1 capsule by mouth daily. One tab qd) 90 capsule 3  . Tamsulosin HCl (FLOMAX) 0.4 MG CAPS Take 0.4 mg by mouth 2 (two) times daily.      No current facility-administered medications on file prior to visit.      Allergies  Allergen Reactions  . Simvastatin Other (See Comments)    MYALGIAS  . Oxycodone Other (See Comments)    Severe constipation     Review of Systems:   General:  Denies fever, chills Optho/Auditory:   Denies visual changes, blurred vision Respiratory:   Denies SOB, cough, wheeze, DIB  Cardiovascular:   Denies chest pain, palpitations, painful respirations Gastrointestinal:   Denies nausea,  vomiting, diarrhea.  Endocrine:     Denies new hot or cold intolerance Musculoskeletal:  Denies joint swelling, gait issues, or new unexplained myalgias/ arthralgias Skin:  Denies rash, suspicious lesions  Neurological:    Denies dizziness, unexplained weakness, numbness  Psychiatric/Behavioral:   Denies mood changes    Objective:     Blood pressure 106/67, pulse 66, height 5\' 11"  (1.803 m), weight 190 lb 11.2 oz (86.5 kg), SpO2 98 %.  Body mass index is 26.6 kg/m.  General: Well Developed, well nourished, and in no acute distress.  HEENT: Normocephalic, atraumatic, pupils equal round reactive to light, neck supple, No carotid bruits, no JVD Skin: Warm and dry, cap RF less 2 sec Cardiac: Regular rate and rhythm, S1, S2 WNL's, no murmurs rubs or gallops Respiratory: ECTA B/L, Not using accessory muscles, speaking in full sentences. NeuroM-Sk: Ambulates w/o assistance, moves ext * 4 w/o difficulty, sensation grossly intact.  Ext: scant edema b/l lower ext Psych: No HI/SI, judgement and insight good, Euthymic mood. Full Affect.

## 2017-04-22 ENCOUNTER — Encounter: Payer: Self-pay | Admitting: Neurology

## 2017-04-22 ENCOUNTER — Ambulatory Visit (INDEPENDENT_AMBULATORY_CARE_PROVIDER_SITE_OTHER): Payer: Medicare Other | Admitting: Neurology

## 2017-04-22 VITALS — BP 149/81 | HR 62 | Ht 71.0 in | Wt 190.0 lb

## 2017-04-22 DIAGNOSIS — E538 Deficiency of other specified B group vitamins: Secondary | ICD-10-CM | POA: Diagnosis not present

## 2017-04-22 DIAGNOSIS — H5310 Unspecified subjective visual disturbances: Secondary | ICD-10-CM | POA: Diagnosis not present

## 2017-04-22 DIAGNOSIS — G4489 Other headache syndrome: Secondary | ICD-10-CM | POA: Diagnosis not present

## 2017-04-22 NOTE — Patient Instructions (Signed)
We will get MRI of the brain and get a carotid doppler study. 

## 2017-04-22 NOTE — Progress Notes (Signed)
Reason for visit: Visual disturbance  Referring physician: Dr. Santiago Glad is a 84 y.o. male  History of present illness:  [name] is an 84 year old left-handed white male with a history of episodic visual disturbances that have been occurring over the last month prior to this evaluation.  The patient has had episodes that seem to affect the center of the vision and a bit off to the left with a wavy vision as if he is looking on top of a body of water.  The episodes may last only 3-4 minutes and then clear.  The patient also has right occipital headaches that may come and go on associated with a visual disturbances, this is also new for him.  The episodes are pulsating in nature and again very brief lasting only a few minutes and then clearing.  The patient indicates that he has never had headaches throughout his life, he has never had any visual disturbances such as this.  He has no definite family history of migraine headache.  The patient reports no other episodes with these headaches or visual disturbances such as slurred speech, weakness or numbness of the extremities or face, or problems with confusion.  The patient does have some mild memory issues at times, but this is not associated with the above events.  The patient does have some balance issues since a total knee replacement on the right, he has had a prior knee replacement on the left as well.  The patient reports some troubles with constipation, he denies any issues controlling the bladder.  He is sent to this office for an evaluation.  He has apparently had a dilated eye examination through his optometrist, no pathology was noted.  Past Medical History:  Diagnosis Date  . Arthritis    BOTH KNEES and hands;Dr.Deveshwar  . Constipation due to pain medication   . Diabetes mellitus without complication ([place])    Type II. Uses no medication. Pt controls with diet and weight  . Enlarged prostate   . Frequent urination at  night   . GERD (gastroesophageal reflux disease)   . Hyperlipidemia   . Hypertension   . Hypothyroidism   . Polio    as a child w/o complications  . Snoring    no sleep apnea  . Torn meniscus   . Transfusion history 2012   post TKR    Past Surgical History:  Procedure Laterality Date  . BACK SURGERY  2011   hemiarthroplasty 01/11/1999 and 6  . CARDIAC CATHETERIZATION  09/2001   negative  . EYE SURGERY  2003   R&L cataract removed & IOL- 2003 & 2007,Dr.Jenkins  . INGUINAL HERNIA REPAIR  2002   right, Dr. Truitt Leep  . JOINT REPLACEMENT  2012   left knee replacement  . LIPOMA EXCISION     back  . MENISCECTOMY Bilateral    Dr. Noemi Chapel  . PROSTATE SURGERY  07/2006   for urinary urgency, Dr. Risa Grill  . ROTATOR CUFF REPAIR Right October, 2016  . sinus & throat surgery-1995  1995  . TONSILLECTOMY    . TOTAL KNEE ARTHROPLASTY  01/11/2011   Procedure: TOTAL KNEE ARTHROPLASTY;  Surgeon: Rudean Haskell, MD;  Location: [facility] [facility];  Service: Orthopedics;  Laterality: Left;  . TOTAL KNEE ARTHROPLASTY Right 11/22/2016   Procedure: TOTAL KNEE ARTHROPLASTY;  Surgeon: Vickey Huger, MD;  Location: [facility];  Service: Orthopedics;  Laterality: Right;  . UVULECTOMY  8756   Dr. Erik Obey  .  VASECTOMY    . WRIST SURGERY  1959   fracture- right    Family History  Problem Relation Age of Onset  . Sudden death Father        MVA  . Coronary artery disease Mother   . Emphysema Mother   . Diverticulosis Mother   . Thyroid disease Mother   . Asthma Brother   . Bone cancer Brother   . Diabetes Paternal Grandmother   . Sarcoidosis Son   . Thyroid disease Daughter   . Anesthesia problems Neg Hx   . Hypotension Neg Hx   . Malignant hyperthermia Neg Hx   . Pseudochol deficiency Neg Hx   . Colon cancer Neg Hx     Social history:  reports that he quit smoking about 51 years ago. He has a 18.00 pack-year smoking history. he has never used smokeless tobacco. He reports that he drinks about 2.4 oz of  alcohol per week. He reports that he does not use drugs.  Medications:  Prior to Admission medications   Medication Sig Start Date End Date Taking? Authorizing Provider  acetaminophen (TYLENOL) 500 MG tablet Take 1,000 mg by mouth at bedtime.    Yes [provider]  aspirin 81 MG tablet Take 81 mg by mouth daily.   Yes [provider]  Calcium Carb-Cholecalciferol (CALCIUM 600 + D PO) Take 1 tablet by mouth every evening.   Yes [provider]  CALCIUM-MAGNESIUM-ZINC PO Take 1 tablet by mouth daily.   Yes [provider]  glucose blood (PRECISION XTRA TEST STRIPS) test strip USE TO CHECK BLOOD SUGAR ONCE DAILY DX E11.9 04/17/15  Yes Midge Minium, MD  Hypromellose (ARTIFICIAL TEARS OP) Place 1 drop into both eyes 2 (two) times daily.   Yes [provider]  LANCETS ULTRA THIN 30G [place] Patient is to use to check blood glucose in the AM fasting and 2 hours after largest meal. 04/14/17  Yes Opalski, Neoma Laming, DO  levothyroxine (SYNTHROID, LEVOTHROID) 150 MCG tablet TAKE 1 TABLET ON MONDAY, [place] MORNING Patient taking differently: Take 150 mcg by mouth daily.  01/18/17  Yes Opalski, Deborah, DO  losartan (COZAAR) 50 MG tablet TAKE ONE-HALF (1/2) TABLET EVERY OTHER DAY 07/12/16  Yes Opalski, Neoma Laming, DO  Multiple Vitamin (MULTIVITAMIN WITH MINERALS) TABS Take 1 tablet by mouth daily.   Yes [provider]  NONFORMULARY OR COMPOUNDED ITEM Glucometer (Precision [place]- Model #XCC [id number]) 04/17/15  Yes Midge Minium, MD  Omega-3 Fatty Acids (FISH OIL) 1000 MG CAPS Take 1,000 mg by mouth every evening.   Yes [provider]  Potassium 99 MG TABS Take 99 mg by mouth daily.   Yes [provider]  Probiotic Product (RESTORA) CAPS One tab qd Patient taking differently: Take 1 capsule by mouth daily. One tab qd 07/25/15  Yes Opalski, Neoma Laming, DO  Tamsulosin HCl (FLOMAX) 0.4 MG CAPS Take 0.4 mg by mouth 2 (two) times  daily.    Yes [provider]      Allergies  Allergen Reactions  . Simvastatin Other (See Comments)    MYALGIAS  . Oxycodone Other (See Comments)    Severe constipation    ROS:  Out of a complete 14 system review of symptoms, the patient complains only of the following symptoms, and all other reviewed systems are negative.  Visual disturbance, headache  Blood pressure (!) 149/81, pulse 62, height 5\' 11"  (1.803 m), weight 190 lb (86.2 kg).  Physical Exam  General:  The patient is alert and cooperative at the time of the examination.  Eyes: Pupils are equal, round, and reactive to light. Discs are flat bilaterally.  Neck: The neck is supple, no carotid bruits are noted.  Respiratory: The respiratory examination is clear.  Cardiovascular: The cardiovascular examination reveals a regular rate and rhythm, no obvious murmurs or rubs are noted.  Skin: Extremities are without significant edema.  Neurologic Exam  Mental status: The patient is alert and oriented x 3 at the time of the examination. The patient has apparent normal recent and remote memory, with an apparently normal attention span and concentration ability.  Cranial nerves: Facial symmetry is present. There is good sensation of the face to pinprick and soft touch bilaterally. The strength of the facial muscles and the muscles to head turning and shoulder shrug are normal bilaterally. Speech is well enunciated, no aphasia or dysarthria is noted. Extraocular movements are full. Visual fields are full. The tongue is midline, and the patient has symmetric elevation of the soft palate. No obvious hearing deficits are noted.  Motor: The motor testing reveals 5 over 5 strength of all 4 extremities. Good symmetric motor tone is noted throughout.  Sensory: Sensory testing is intact to pinprick, soft touch, vibration sensation, and position sense on all 4 extremities. No evidence of extinction is noted.  Coordination:  Cerebellar testing reveals good finger-nose-finger and heel-to-shin bilaterally.  Gait and station: Gait is normal. Tandem gait is slightly unsteady. Romberg is negative. No drift is seen.  Reflexes: Deep tendon reflexes are symmetric and normal bilaterally. Toes are downgoing bilaterally.   Assessment/Plan:  1.  Episodic visual disturbance  2.  Right occipital headache, new onset  The patient has no prior history of migraine events throughout his life.  The patient has had new onset of episodes of headache and visual disturbances that have started over the last 4 weeks.  The patient does have multiple risk factors for cerebrovascular disease including hypertension, diabetes, and coronary artery disease.  The patient is on low-dose aspirin.  He will continue this.  We will get him set up for MRI of the brain, carotid Doppler study, and we will check blood work today.  I will call him with the results of the above.   Jill Alexanders MD 04/22/2017 8:34 AM  [facility] [address] [place], [place] [address]  Phone [phone] Fax [phone]

## 2017-04-23 LAB — VITAMIN B12: Vitamin B-12: 154 pg/mL — ABNORMAL LOW (ref 232–1245)

## 2017-04-23 LAB — C-REACTIVE PROTEIN: CRP: 1.2 mg/L (ref 0.0–4.9)

## 2017-04-23 LAB — SEDIMENTATION RATE: Sed Rate: 6 mm/hr (ref 0–30)

## 2017-04-24 ENCOUNTER — Telehealth: Payer: Self-pay | Admitting: Neurology

## 2017-04-24 NOTE — Telephone Encounter (Signed)
I called the patient.  The blood work reveals a low vitamin B12 level.  The patient will come in for a B12 shot, and then he will go on 1000 mcg tablets of vitamin B12.

## 2017-04-25 NOTE — Telephone Encounter (Signed)
Called and spoke w/ patient. Scheduled B12 inj for this Wednesday 04/27/17 at 1:30pm. Pt verbalized understanding.

## 2017-04-26 ENCOUNTER — Other Ambulatory Visit (INDEPENDENT_AMBULATORY_CARE_PROVIDER_SITE_OTHER): Payer: Medicare Other

## 2017-04-26 DIAGNOSIS — E063 Autoimmune thyroiditis: Secondary | ICD-10-CM | POA: Diagnosis not present

## 2017-04-26 LAB — TSH: TSH: 2.87 u[IU]/mL (ref 0.35–4.50)

## 2017-04-26 LAB — T4, FREE: Free T4: 1.02 ng/dL (ref 0.60–1.60)

## 2017-04-27 ENCOUNTER — Ambulatory Visit (INDEPENDENT_AMBULATORY_CARE_PROVIDER_SITE_OTHER): Payer: Medicare Other | Admitting: *Deleted

## 2017-04-27 DIAGNOSIS — E538 Deficiency of other specified B group vitamins: Secondary | ICD-10-CM | POA: Diagnosis not present

## 2017-04-27 MED ORDER — CYANOCOBALAMIN 1000 MCG/ML IJ SOLN
1000.0000 ug | Freq: Once | INTRAMUSCULAR | Status: AC
  Administered 2017-04-27: 1000 ug via INTRAMUSCULAR

## 2017-04-27 NOTE — Progress Notes (Signed)
Gave Vitamin B12 IM in left deltoid. Cleaned with alcohol wipe prior to injection. Band-aid applied. Pt tolerated well.  Instructed pt to start taking 1041mcg Vit B12 OTC daily per CW,MD notes. Added to pt med list. Patient was able to repeat directions back correctly.

## 2017-04-29 ENCOUNTER — Encounter: Payer: Self-pay | Admitting: Endocrinology

## 2017-04-29 ENCOUNTER — Ambulatory Visit (INDEPENDENT_AMBULATORY_CARE_PROVIDER_SITE_OTHER): Payer: Medicare Other | Admitting: Endocrinology

## 2017-04-29 VITALS — BP 124/78 | HR 51 | Ht 71.0 in | Wt 192.4 lb

## 2017-04-29 DIAGNOSIS — E063 Autoimmune thyroiditis: Secondary | ICD-10-CM | POA: Diagnosis not present

## 2017-04-29 NOTE — Progress Notes (Signed)
Patient ID: CANE DUBRAY, male   DOB: 03/24/1932, 84 y.o.   MRN: [id number]           Referring Physician: Raliegh Scarlet  Reason for Appointment:  Hypothyroidism,  follow-up visit    History of Present Illness:   Hypothyroidism was first diagnosed  10-15 years ago   At the time of diagnosis patient was apparently not having symptoms of  fatigue, cold sensitivity, difficulty concentrating, dry skin, weight gain  He thinks his primary care physician may have done routine testing and told him that he was hypothyroid and started him on unknown dose of levothyroxine He did not start feeling any different when he started taking levothyroxine initially Also usually does not feel any different with dosage adjustments that have been made Apparently his dose was 150 g for some time but this was reduced in 06/2015 when his TSH was low normal Since then he has been taking 150 g 5 days a week and 125 g twice a week Non-insulin initial consultation he was complaining that since spring time his energy level has been not as good and he has not been able to do as much with activities as he used to   He was referred here because his TSH is tending to be high but also his free T4 was relatively higher in 7/18  Recent history: On his initial consultation since his TSH was still relatively high and he was taking the equivalent of about 142 g of levothyroxine daily he was switched to taking 150 g once daily  Also he was told to not take his levothyroxine at bedtime but take it before breakfast He is taking this regularly every morning Subsequently his TSH has been back to normal  No complaints of any fatigue or significant weight change recently  TSH is quite normal at 2.9, previously 3.8    Patient's weight history is as follows:  Wt Readings from Last 3 Encounters:  04/29/17 192 lb 6.4 oz (87.3 kg)  04/22/17 190 lb (86.2 kg)  04/14/17 190 lb 11.2 oz (86.5 kg)    Thyroid function results have  been as follows:  Lab Results  Component Value Date   TSH 2.87 04/26/2017   TSH 3.85 12/28/2016   TSH 5.860 (H) 09/15/2016   TSH 5.070 (H) 06/22/2016   FREET4 1.02 04/26/2017   FREET4 1.27 12/28/2016   FREET4 1.79 (H) 09/15/2016   FREET4 1.47 06/22/2016   T3FREE 3.2 12/28/2016   T3FREE 2.7 10/17/2015    Lab Results  Component Value Date   TSH 2.87 04/26/2017   TSH 3.85 12/28/2016   TSH 5.860 (H) 09/15/2016   TSH 5.070 (H) 06/22/2016   TSH 2.19 02/04/2016   TSH 4.31 12/16/2015   TSH 3.45 10/17/2015   TSH 0.66 07/29/2015   TSH 0.859 03/21/2015     Past Medical History:  Diagnosis Date  . Arthritis    BOTH KNEES and hands;Dr.Deveshwar  . Constipation due to pain medication   . Diabetes mellitus without complication ([place])    Type II. Uses no medication. Pt controls with diet and weight  . Enlarged prostate   . Frequent urination at night   . GERD (gastroesophageal reflux disease)   . Hyperlipidemia   . Hypertension   . Hypothyroidism   . Polio    as a child w/o complications  . Snoring    no sleep apnea  . Torn meniscus   . Transfusion history 2012   post TKR  Past Surgical History:  Procedure Laterality Date  . BACK SURGERY  2011   hemiarthroplasty 01/11/1999 and 6  . CARDIAC CATHETERIZATION  09/2001   negative  . EYE SURGERY  2003   R&L cataract removed & IOL- 2003 & 2007,Dr.Jenkins  . INGUINAL HERNIA REPAIR  2002   right, Dr. Truitt Leep  . JOINT REPLACEMENT  2012   left knee replacement  . LIPOMA EXCISION     back  . MENISCECTOMY Bilateral    Dr. Noemi Chapel  . PROSTATE SURGERY  07/2006   for urinary urgency, Dr. Risa Grill  . ROTATOR CUFF REPAIR Right October, 2016  . sinus & throat surgery-1995  1995  . TONSILLECTOMY    . TOTAL KNEE ARTHROPLASTY  01/11/2011   Procedure: TOTAL KNEE ARTHROPLASTY;  Surgeon: Rudean Haskell, MD;  Location: [facility];  Service: Orthopedics;  Laterality: Left;  . TOTAL KNEE ARTHROPLASTY Right 11/22/2016   Procedure: TOTAL  KNEE ARTHROPLASTY;  Surgeon: Vickey Huger, MD;  Location: [facility];  Service: Orthopedics;  Laterality: Right;  . UVULECTOMY  6295   Dr. Erik Obey  . VASECTOMY    . WRIST SURGERY  1959   fracture- right    Family History  Problem Relation Age of Onset  . Sudden death Father        MVA  . Coronary artery disease Mother   . Emphysema Mother   . Diverticulosis Mother   . Thyroid disease Mother   . Asthma Brother   . Bone cancer Brother   . Diabetes Paternal Grandmother   . Sarcoidosis Son   . Thyroid disease Daughter   . Anesthesia problems Neg Hx   . Hypotension Neg Hx   . Malignant hyperthermia Neg Hx   . Pseudochol deficiency Neg Hx   . Colon cancer Neg Hx     Social History:  reports that he quit smoking about 51 years ago. He has a 18.00 pack-year smoking history. he has never used smokeless tobacco. He reports that he drinks about 2.4 oz of alcohol per week. He reports that he does not use drugs.  Allergies:  Allergies  Allergen Reactions  . Simvastatin Other (See Comments)    MYALGIAS  . Oxycodone Other (See Comments)    Severe constipation    Allergies as of 04/29/2017      Reactions   Simvastatin Other (See Comments)   MYALGIAS   Oxycodone Other (See Comments)   Severe constipation      Medication List        Accurate as of 04/29/17 10:01 AM. Always use your most recent med list.          acetaminophen 500 MG tablet Commonly known as:  TYLENOL Take 1,000 mg by mouth as needed for mild pain.   ARTIFICIAL TEARS OP Place 1 drop into both eyes 2 (two) times daily.   aspirin 81 MG tablet Take 81 mg by mouth daily.   CALCIUM 600 + D PO Take 1 tablet by mouth every evening.   CALCIUM-MAGNESIUM-ZINC PO Take 1 tablet by mouth daily.   Fish Oil 1000 MG Caps Take 1,000 mg by mouth every evening.   FLOMAX 0.4 MG Caps capsule Generic drug:  tamsulosin Take 0.4 mg by mouth 2 (two) times daily.   glucose blood test strip Commonly known as:  PRECISION XTRA  TEST STRIPS USE TO CHECK BLOOD SUGAR ONCE DAILY DX E11.9   LANCETS ULTRA THIN 30G Misc Patient is to use to check blood glucose in the AM  fasting and 2 hours after largest meal.   levothyroxine 150 MCG tablet Commonly known as:  SYNTHROID, LEVOTHROID TAKE 1 TABLET ON MONDAY, WEDNESDAY AND FRIDAY MORNING   losartan 50 MG tablet Commonly known as:  COZAAR TAKE ONE-HALF (1/2) TABLET EVERY OTHER DAY   multivitamin with minerals Tabs tablet Take 1 tablet by mouth daily.   NONFORMULARY OR COMPOUNDED ITEM Glucometer (Precision XTRA- Model #[id number])   Potassium 99 MG Tabs Take 99 mg by mouth daily.   RESTORA Caps One tab qd   vitamin B-12 1000 MCG tablet Commonly known as:  CYANOCOBALAMIN Take 1,000 mcg by mouth daily.          Review of Systems            Examination:    BP 124/78   Pulse (!) 51   Ht 5\' 11"  (1.803 m)   Wt 192 lb 6.4 oz (87.3 kg)   SpO2 97%   BMI 26.83 kg/m       Assessment:  HYPOTHYROIDISM, likely autoimmune with no associated goiter  He has had consistently normal thyroid levels with taking 150 mcg levothyroxine Now he is compliant with taking this in the morning daily before breakfast as directed He is subjectively doing well  TSH is consistently back to normal and adequate    PLAN:   Since he has consistent levels of TSH and doing well with his compliance he can continue the same dose of 150 mg daily  Advised him that he can have his TSH checked with his PCP every 6 months and come back as needed    Elayne Snare 04/29/2017, 10:01 AM      Note: This office note was prepared with Dragon voice recognition system technology. Any transcriptional errors that result from this process are unintentional.

## 2017-05-07 ENCOUNTER — Other Ambulatory Visit: Payer: Medicare Other

## 2017-05-07 ENCOUNTER — Ambulatory Visit
Admission: RE | Admit: 2017-05-07 | Discharge: 2017-05-07 | Disposition: A | Discharge status: Home / Self Care | Payer: Medicare Other | Source: Ambulatory Visit | Attending: Neurology | Admitting: Neurology

## 2017-05-07 DIAGNOSIS — G4489 Other headache syndrome: Secondary | ICD-10-CM | POA: Diagnosis not present

## 2017-05-07 DIAGNOSIS — H5310 Unspecified subjective visual disturbances: Secondary | ICD-10-CM

## 2017-05-08 ENCOUNTER — Telehealth: Payer: Self-pay | Admitting: Neurology

## 2017-05-08 NOTE — Telephone Encounter (Signed)
I called patient.  MRI of the brain shows a moderate level of small vessel disease.  Patient is on low-dose aspirin, he has hypertension, diabetes, and dyslipidemia.  He indicates that his wife has a blood pressure cuff, I have indicated that he should be monitoring his blood pressure regularly, when he was seen in the office is his systolic blood pressure was 150, it should be under 130.  He will contact his primary doctor if his blood pressures are trending higher than 130 regularly.  A carotid Doppler study is pending.   MRI brain 05/08/17:  IMPRESSION:  This MRI of the brain without contrast shows the following: 1.    Mild generalized cortical atrophy, typical for age. 2.    Moderate chronic microvascular ischemic changes. 3.    There are no acute findings.

## 2017-05-12 DIAGNOSIS — I739 Peripheral vascular disease, unspecified: Secondary | ICD-10-CM | POA: Diagnosis not present

## 2017-05-12 DIAGNOSIS — L603 Nail dystrophy: Secondary | ICD-10-CM | POA: Diagnosis not present

## 2017-05-12 DIAGNOSIS — L84 Corns and callosities: Secondary | ICD-10-CM | POA: Diagnosis not present

## 2017-05-16 ENCOUNTER — Telehealth: Payer: Self-pay | Admitting: Neurology

## 2017-05-16 ENCOUNTER — Ambulatory Visit (HOSPITAL_COMMUNITY)
Admission: RE | Admit: 2017-05-16 | Discharge: 2017-05-16 | Disposition: A | Discharge status: Home / Self Care | Payer: Medicare Other | Source: Ambulatory Visit | Attending: Neurology | Admitting: Neurology

## 2017-05-16 DIAGNOSIS — H5310 Unspecified subjective visual disturbances: Secondary | ICD-10-CM | POA: Diagnosis not present

## 2017-05-16 DIAGNOSIS — Z794 Long term (current) use of insulin: Secondary | ICD-10-CM | POA: Insufficient documentation

## 2017-05-16 DIAGNOSIS — E119 Type 2 diabetes mellitus without complications: Secondary | ICD-10-CM | POA: Insufficient documentation

## 2017-05-16 DIAGNOSIS — E785 Hyperlipidemia, unspecified: Secondary | ICD-10-CM | POA: Diagnosis not present

## 2017-05-16 DIAGNOSIS — G4489 Other headache syndrome: Secondary | ICD-10-CM | POA: Insufficient documentation

## 2017-05-16 DIAGNOSIS — I10 Essential (primary) hypertension: Secondary | ICD-10-CM | POA: Insufficient documentation

## 2017-05-16 NOTE — Progress Notes (Signed)
VASCULAR LAB PRELIMINARY  PRELIMINARY  PRELIMINARY  PRELIMINARY  Carotid duplex completed.    Preliminary report: 1-39% ICA plaquing.  Vertebral artery flow is antegrade.   Roby Donaway, RVT 05/16/2017, 9:37 AM

## 2017-05-16 NOTE — Telephone Encounter (Signed)
I called the patient.  The carotid Doppler study was unremarkable.  He is no longer having headaches or episodes of visual disturbance.  He will call me if further problems are noted.   Carotid doppler 05/16/17:  Final Interpretation: Right Carotid: The extracranial vessels were near-normal with only minimal wall        thickening or plaque.  Left Carotid: The extracranial vessels were near-normal with only minimal wall       thickening or plaque.  Vertebrals: Bilateral vertebral arteries demonstrate antegrade flow. Subclavians: Normal flow hemodynamics were seen in bilateral subclavian       arteries.

## 2017-05-18 ENCOUNTER — Encounter: Payer: Self-pay | Admitting: Family Medicine

## 2017-05-18 ENCOUNTER — Ambulatory Visit (INDEPENDENT_AMBULATORY_CARE_PROVIDER_SITE_OTHER): Payer: Medicare Other | Admitting: Family Medicine

## 2017-05-18 VITALS — BP 117/70 | HR 86 | Ht 71.0 in | Wt 190.4 lb

## 2017-05-18 DIAGNOSIS — E785 Hyperlipidemia, unspecified: Secondary | ICD-10-CM | POA: Diagnosis not present

## 2017-05-18 DIAGNOSIS — N183 Chronic kidney disease, stage 3 unspecified: Secondary | ICD-10-CM

## 2017-05-18 DIAGNOSIS — I10 Essential (primary) hypertension: Secondary | ICD-10-CM | POA: Diagnosis not present

## 2017-05-18 DIAGNOSIS — E119 Type 2 diabetes mellitus without complications: Secondary | ICD-10-CM | POA: Diagnosis not present

## 2017-05-18 DIAGNOSIS — Z0000 Encounter for general adult medical examination without abnormal findings: Secondary | ICD-10-CM | POA: Diagnosis not present

## 2017-05-18 DIAGNOSIS — E1159 Type 2 diabetes mellitus with other circulatory complications: Secondary | ICD-10-CM

## 2017-05-18 DIAGNOSIS — E1169 Type 2 diabetes mellitus with other specified complication: Secondary | ICD-10-CM

## 2017-05-18 DIAGNOSIS — I152 Hypertension secondary to endocrine disorders: Secondary | ICD-10-CM

## 2017-05-18 NOTE — Progress Notes (Signed)
Subjective:   Kyle Simon is a 84 y.o. male who presents for Medicare Annual/Subsequent preventive examination.  Activities of Daily Living In your present state of health, do you have difficulty performing the following activities?  1- Driving - no 2- Managing money - no 3- Feeding yourself - no 4- Getting from the bed to the chair - no 5- Climbing a flight of stairs - no 6- Preparing food and eating - no 7- Bathing or showering - no 8- Getting dressed - no 9- Getting to the toilet - no 10- Using the toilet - no 11- Moving around from place to place - no  Patient states that he feels safe at home.   Depression screen [facility] 2/9 05/18/2017 04/14/2017 11/11/2016 07/25/2015 03/21/2015  Decreased Interest 0 0 0 0 0  Down, Depressed, Hopeless 0 0 0 0 0  PHQ - 2 Score 0 0 0 0 0  Altered sleeping 0 0 - - -  Tired, decreased energy 0 0 - - -  Change in appetite 0 0 - - -  Feeling bad or failure about yourself  0 0 - - -  Trouble concentrating 0 0 - - -  Moving slowly or fidgety/restless 0 0 - - -  Suicidal thoughts 0 0 - - -  PHQ-9 Score 0 0 - - -  Difficult doing work/chores Not difficult at all Not difficult at all - - -   Fall Risk  05/18/2017 04/14/2017 11/11/2016 07/25/2015 03/21/2015  Falls in the past year? No No No No No   6CIT Screen 05/18/2017  What Year? 0 points  What month? 0 points  What time? 0 points  Count back from 20 0 points  Months in reverse 0 points  Repeat phrase 0 points  Total Score 0    Current Exercise Habits: Home exercise routine, Type of exercise: walking;Other - see comments(patient works in his yard), Intensity: Moderate    Functional Status Survey: Is the patient deaf or have difficulty hearing?: No Does the patient have difficulty seeing, even when wearing glasses/contacts?: No Does the patient have difficulty concentrating, remembering, or making decisions?: No Does the patient have difficulty walking or climbing stairs?: No Does the  patient have difficulty dressing or bathing?: No Does the patient have difficulty doing errands alone such as visiting a doctor's office or shopping?: No  Review of Systems:  General:   Denies fever, chills, unexplained weight loss.  Optho/Auditory:   Denies visual changes, blurred vision/LOV Respiratory:   Denies SOB, DOE more than baseline levels.  Cardiovascular:   Denies chest pain, palpitations, new onset peripheral edema  Gastrointestinal:   Denies nausea, vomiting, diarrhea.  Genitourinary: Denies dysuria, freq/ urgency, flank pain or discharge from genitals.  Endocrine:     Denies hot or cold intolerance, polyuria, polydipsia. Musculoskeletal:   Denies unexplained myalgias, joint swelling, unexplained arthralgias, gait problems.  Skin:  Denies rash, suspicious lesions Neurological:     Denies dizziness, unexplained weakness, numbness  Psychiatric/Behavioral:   Denies mood changes, suicidal or homicidal ideations, hallucinations          Objective:    Vitals: BP 117/70   Pulse 86   Ht 5\' 11"  (1.803 m)   Wt 190 lb 6.4 oz (86.4 kg)   SpO2 98%   BMI 26.56 kg/m   Body mass index is 26.56 kg/m.  Advanced Directives 11/16/2016 09/15/2016 07/25/2015 10/11/2013 09/30/2011 12/31/2010  Does Patient Have a Medical Advance Directive? Yes Yes Yes Yes Patient has  advance directive, copy in chart Patient has advance directive, copy in chart  Type of Advance Directive Living will [place];Living will [place];Living will [place];Living will [place];Living will [place];Living will  Does patient want to make changes to medical advance directive? No - Patient declined - No - Patient declined No - Patient declined - -  Copy of [place] in Chart? - - - Yes - -  Pre-existing out of facility DNR order (yellow form or pink MOST form) - - - - No No    Tobacco Social History    Tobacco Use  Smoking Status Former Smoker  . Packs/day: 1.00  . Years: 18.00  . Pack years: 18.00  . Last attempt to quit: 03/01/1966  . Years since quitting: 51.2  Smokeless Tobacco Never Used     Counseling given: Not Answered   Clinical Intake:                       Past Medical History:  Diagnosis Date  . Arthritis    BOTH KNEES and hands;Dr.Deveshwar  . Constipation due to pain medication   . Diabetes mellitus without complication ([place])    Type II. Uses no medication. Pt controls with diet and weight  . Enlarged prostate   . Frequent urination at night   . GERD (gastroesophageal reflux disease)   . Hyperlipidemia   . Hypertension   . Hypothyroidism   . Polio    as a child w/o complications  . Snoring    no sleep apnea  . Torn meniscus   . Transfusion history 2012   post TKR   Past Surgical History:  Procedure Laterality Date  . BACK SURGERY  2011   hemiarthroplasty 01/11/1999 and 6  . CARDIAC CATHETERIZATION  09/2001   negative  . EYE SURGERY  2003   R&L cataract removed & IOL- 2003 & 2007,Dr.Jenkins  . INGUINAL HERNIA REPAIR  2002   right, Dr. Truitt Leep  . JOINT REPLACEMENT  2012   left knee replacement  . LIPOMA EXCISION     back  . MENISCECTOMY Bilateral    Dr. Noemi Chapel  . PROSTATE SURGERY  07/2006   for urinary urgency, Dr. Risa Grill  . ROTATOR CUFF REPAIR Right October, 2016  . sinus & throat surgery-1995  1995  . TONSILLECTOMY    . TOTAL KNEE ARTHROPLASTY  01/11/2011   Procedure: TOTAL KNEE ARTHROPLASTY;  Surgeon: Rudean Haskell, MD;  Location: [place] [facility];  Service: Orthopedics;  Laterality: Left;  . TOTAL KNEE ARTHROPLASTY Right 11/22/2016   Procedure: TOTAL KNEE ARTHROPLASTY;  Surgeon: Vickey Huger, MD;  Location: [facility];  Service: Orthopedics;  Laterality: Right;  . UVULECTOMY  6387   Dr. Erik Obey  . VASECTOMY    . WRIST SURGERY  1959   fracture- right   Family History  Problem Relation Age of Onset  . Sudden death Father         MVA  . Coronary artery disease Mother   . Emphysema Mother   . Diverticulosis Mother   . Thyroid disease Mother   . Asthma Brother   . Bone cancer Brother   . Diabetes Paternal Grandmother   . Sarcoidosis Son   . Thyroid disease Daughter   . Anesthesia problems Neg Hx   . Hypotension Neg Hx   . Malignant hyperthermia Neg Hx   . Pseudochol deficiency Neg Hx   .  Colon cancer Neg Hx    Social History   Socioeconomic History  . Marital status: Married    Spouse name: None  . Number of children: None  . Years of education: None  . Highest education level: None  Social Needs  . Financial resource strain: None  . Food insecurity - worry: None  . Food insecurity - inability: None  . Transportation needs - medical: None  . Transportation needs - non-medical: None  Occupational History  . None  Tobacco Use  . Smoking status: Former Smoker    Packs/day: 1.00    Years: 18.00    Pack years: 18.00    Last attempt to quit: 03/01/1966    Years since quitting: 51.2  . Smokeless tobacco: Never Used  Substance and Sexual Activity  . Alcohol use: Yes    Alcohol/week: 2.4 oz    Types: 4 Shots of liquor per week    Comment: social  . Drug use: No  . Sexual activity: Yes  Other Topics Concern  . None  Social History Narrative  . None    Outpatient Encounter Medications as of 05/18/2017  Medication Sig  . acetaminophen (TYLENOL) 500 MG tablet Take 1,000 mg by mouth as needed for mild pain.   Marland Kitchen aspirin 81 MG tablet Take 81 mg by mouth daily.  . Calcium Carb-Cholecalciferol (CALCIUM 600 + D PO) Take 1 tablet by mouth every evening.  Marland Kitchen CALCIUM-MAGNESIUM-ZINC PO Take 1 tablet by mouth daily.  Marland Kitchen glucose blood (PRECISION XTRA TEST STRIPS) test strip USE TO CHECK BLOOD SUGAR ONCE DAILY DX E11.9  . Hypromellose (ARTIFICIAL TEARS OP) Place 1 drop into both eyes 2 (two) times daily.  Marland Kitchen LANCETS ULTRA THIN 30G MISC Patient is to use to check blood glucose in the AM fasting and 2 hours after  largest meal.  . levothyroxine (SYNTHROID, LEVOTHROID) 150 MCG tablet Take 150 mcg by mouth daily before breakfast.  . losartan (COZAAR) 50 MG tablet TAKE ONE-HALF (1/2) TABLET EVERY OTHER DAY  . Multiple Vitamin (MULTIVITAMIN WITH MINERALS) TABS Take 1 tablet by mouth daily.  . NONFORMULARY OR COMPOUNDED ITEM Glucometer Museum/gallery curator XTRA- Model #[id number])  . Omega-3 Fatty Acids (FISH OIL) 1000 MG CAPS Take 1,000 mg by mouth every evening.  . Potassium 99 MG TABS Take 99 mg by mouth daily.  . Probiotic Product (RESTORA) CAPS One tab qd (Patient taking differently: Take 1 capsule by mouth daily. One tab qd)  . Tamsulosin HCl (FLOMAX) 0.4 MG CAPS Take 0.4 mg by mouth 2 (two) times daily.   . vitamin B-12 (CYANOCOBALAMIN) 1000 MCG tablet Take 1,000 mcg by mouth daily.  . [DISCONTINUED] levothyroxine (SYNTHROID, LEVOTHROID) 150 MCG tablet TAKE 1 TABLET ON MONDAY, WEDNESDAY AND FRIDAY MORNING (Patient taking differently: Take 150 mcg by mouth. TAKE 1 TABLET ON MONDAY, WEDNESDAY AND FRIDAY MORNING)   No facility-administered encounter medications on file as of 05/18/2017.     Activities of Daily Living In your present state of health, do you have any difficulty performing the following activities: 05/18/2017 11/16/2016  Hearing? N N  Vision? N N  Difficulty concentrating or making decisions? N N  Walking or climbing stairs? N N  Dressing or bathing? N N  Doing errands, shopping? N N  Some recent data might be hidden    Patient Care Team: Mellody Dance, DO as PCP - General (Family Medicine) Rana Snare, MD as Consulting Physician (Urology) Lavonna Monarch, MD as Consulting Physician (Dermatology) Jodi Marble, MD as Consulting  Physician (Otolaryngology) Shirley Muscat, Loreen Freud, MD as Referring Physician (Optometry) Inocencio Homes, DPM as Consulting Physician (Podiatry) Bo Merino, MD as Consulting Physician (Rheumatology) Vickey Huger, MD as Consulting Physician (Orthopedic  Surgery) Erline Levine, MD as Consulting Physician (Neurosurgery) Deboraha Sprang, MD as Consulting Physician (Cardiology) Elayne Snare, MD as Consulting Physician (Endocrinology) Kathrynn Ducking, MD as Consulting Physician (Neurology) Jodi Marble, MD as Consulting Physician (Otolaryngology)   Assessment:   This is a routine wellness examination for Kyle Simon.  Exercise Activities and Dietary recommendations Current Exercise Habits: Home exercise routine, Type of exercise: walking;Other - see comments(patient works in his yard), Intensity: Moderate  Goals    None      Fall Risk Fall Risk  05/18/2017 04/14/2017 11/11/2016 07/25/2015 03/21/2015  Falls in the past year? No No No No No   Is the patient's home free of loose throw rugs in walkways, pet beds, electrical cords, etc?   yes      Grab bars in the bathroom? no  Patient does have rails in the shower.       Handrails on the stairs?   yes      Adequate lighting?   yes  Timed Get Up and Go Performed: Passed  Depression Screen PHQ 2/9 Scores 05/18/2017 04/14/2017 11/11/2016 07/25/2015  PHQ - 2 Score 0 0 0 0  PHQ- 9 Score 0 0 - -    Cognitive Function     6CIT Screen 05/18/2017  What Year? 0 points  What month? 0 points  What time? 0 points  Count back from 20 0 points  Months in reverse 0 points  Repeat phrase 0 points  Total Score 0    Immunization History  Administered Date(s) Administered  . Influenza Whole 01/09/2002  . Influenza,inj,Quad PF,6+ Mos 11/29/2012, 11/29/2013  . Influenza-Unspecified 11/30/2014, 11/22/2015  . Pneumococcal Conjugate-13 03/20/2014  . Pneumococcal Polysaccharide-23 03/30/1996, 02/13/2010  . Td 03/30/1996, 04/26/2007    Qualifies for Shingles Vaccine? Yes, but patient declines shingles vaccine secondary to cost  Screening Tests Health Maintenance  Topic Date Due  . INFLUENZA VACCINE  12/15/2017 (Originally 09/29/2016)  . OPHTHALMOLOGY EXAM  04/14/2018 (Originally 01/10/2017)  .  TETANUS/TDAP  05/19/2018 (Originally 04/25/2017)  . FOOT EXAM  06/10/2017  . HEMOGLOBIN A1C  10/12/2017  . PNA vac Low Risk Adult  Completed  . COLONOSCOPY  Discontinued    Blood pressure 117/70, pulse 86, height 5\' 11"  (1.803 m), weight 190 lb 6.4 oz (86.4 kg), SpO2 98 %.  General:  Well Developed, well nourished, appropriate for stated age.  Neuro:  Alert and oriented,  extra-ocular muscles intact HEENT:  Normocephalic, atraumatic, neck supple, no carotid bruits appreciated  Skin:  no gross rash, warm, pink. Cardiac:  RRR, S1 S2 Respiratory:  ECTA B/L and A/P, Not using accessory muscles, speaking in full sentences- unlabored. GU: No hernias, no testicular masses or rash.  Skin within normal limits. Rectal: Visually no gross abnormality. Vascular:  Ext warm, no cyanosis apprec.; cap RF less 2 sec. Psych:  No HI/SI, judgement and insight good, Euthymic mood. Full Affect. *Please see scanned in sheet for patient's FAA physical exam details as well dated today.*  Plan:   I have personally reviewed and noted the following in the patient's chart:   . Medical and social history . Use of alcohol, tobacco or illicit drugs  . Current medications and supplements . Functional ability and status . Nutritional status . Physical activity . Advanced directives . List of other physicians .  Hospitalizations, surgeries, and ER visits in previous 12 months . Vitals . Screenings to include cognitive, depression, and falls . Referrals and appointments  In addition, I have reviewed and discussed with patient certain preventive protocols, quality metrics, and best practice recommendations. A written personalized care plan for preventive services as well as general preventive health recommendations were provided to patient.     Mellody Dance, DO  05/18/2017

## 2017-05-18 NOTE — Patient Instructions (Signed)

## 2017-05-19 ENCOUNTER — Encounter: Payer: Medicare Other | Admitting: Adult Health

## 2017-06-02 DIAGNOSIS — Z96651 Presence of right artificial knee joint: Secondary | ICD-10-CM | POA: Diagnosis not present

## 2017-06-02 DIAGNOSIS — Z471 Aftercare following joint replacement surgery: Secondary | ICD-10-CM | POA: Diagnosis not present

## 2017-06-16 DIAGNOSIS — Z981 Arthrodesis status: Secondary | ICD-10-CM | POA: Diagnosis not present

## 2017-06-16 DIAGNOSIS — G8929 Other chronic pain: Secondary | ICD-10-CM | POA: Diagnosis not present

## 2017-06-16 DIAGNOSIS — M5136 Other intervertebral disc degeneration, lumbar region: Secondary | ICD-10-CM | POA: Diagnosis not present

## 2017-06-16 DIAGNOSIS — Z9889 Other specified postprocedural states: Secondary | ICD-10-CM | POA: Diagnosis not present

## 2017-06-16 DIAGNOSIS — Z96651 Presence of right artificial knee joint: Secondary | ICD-10-CM | POA: Diagnosis not present

## 2017-06-16 DIAGNOSIS — M5416 Radiculopathy, lumbar region: Secondary | ICD-10-CM | POA: Insufficient documentation

## 2017-06-16 DIAGNOSIS — Z471 Aftercare following joint replacement surgery: Secondary | ICD-10-CM | POA: Diagnosis not present

## 2017-06-16 DIAGNOSIS — M25561 Pain in right knee: Secondary | ICD-10-CM | POA: Diagnosis not present

## 2017-07-05 ENCOUNTER — Other Ambulatory Visit: Payer: Self-pay | Admitting: Family Medicine

## 2017-07-05 NOTE — Telephone Encounter (Signed)
Please call patient and see what his preference is.  If he wishes to not see the endocrinologist any longer,  please note that under his name in "care team tab" and state that you spoke with patient and he said he did not wish to follow-up with endocrinology any longer -Okay to refill it for 90 days +1 refill as long as we have had a NORMAL TSH on the patient in the past 6 months.  thnk you,  Dr Jenetta Downer

## 2017-07-05 NOTE — Telephone Encounter (Signed)
Pt saw Dr. Dwyane Dee 04/29/17.  [name] OV note stated "Since he has consistent levels of TSH and doing well with his compliance he can continue the same dose of 150 mg daily  Advised him that he can have his TSH checked with his PCP every 6 months and come back as needed".    I am unsure if you want to send in RX or if Dr. Dwyane Dee should handle the refills.  You have previously prescribed this medication.  Please review and refill if appropriate.  Charyl Bigger, CMA

## 2017-07-07 ENCOUNTER — Ambulatory Visit: Payer: Medicare Other

## 2017-07-07 DIAGNOSIS — Z0184 Encounter for antibody response examination: Secondary | ICD-10-CM

## 2017-07-07 NOTE — Telephone Encounter (Signed)
Pt states that he wishes to only see Dr. Raliegh Scarlet and not Dr. Dwyane Dee anymore since Dr. Dwyane Dee was able to get his thyroid medication regulated.  Per Dr. Raliegh Scarlet, will refill medication as directed by [name] dosing.  Charyl Bigger, CMA

## 2017-07-09 LAB — MEASLES/MUMPS/RUBELLA IMMUNITY
MUMPS ABS, IGG: 35.4 AU/mL (ref >10.9)
RUBEOLA AB, IGG: >300 AU/mL (ref >29.9)
Rubella Antibodies, IGG: >33 index (ref >0.99)

## 2017-07-11 ENCOUNTER — Other Ambulatory Visit: Payer: Self-pay | Admitting: Family Medicine

## 2017-07-12 ENCOUNTER — Encounter: Payer: Self-pay | Admitting: Family Medicine

## 2017-07-12 ENCOUNTER — Ambulatory Visit (INDEPENDENT_AMBULATORY_CARE_PROVIDER_SITE_OTHER): Payer: Medicare Other | Admitting: Family Medicine

## 2017-07-12 VITALS — BP 142/80 | HR 63 | Ht 71.0 in | Wt 192.7 lb

## 2017-07-12 DIAGNOSIS — Z7189 Other specified counseling: Secondary | ICD-10-CM

## 2017-07-12 DIAGNOSIS — Z23 Encounter for immunization: Secondary | ICD-10-CM | POA: Diagnosis not present

## 2017-07-12 DIAGNOSIS — Z789 Other specified health status: Secondary | ICD-10-CM

## 2017-07-12 DIAGNOSIS — Z7185 Encounter for immunization safety counseling: Secondary | ICD-10-CM

## 2017-07-12 NOTE — Progress Notes (Signed)
Impression and Recommendations:    1. Encounter for counseling regarding immunization   2. Not immune to M,M,rubella   3. Need for MMR vaccine     1. Need for MMR Vaccine - Discussed with the patient that given his age and the fact that he has no current immunity to MMR, he does need a vaccine.   - Reviewed with the patient that 1-2 injections are the classic routine.  Patient agrees to receive his first injection today.   Orders Placed This Encounter  Procedures  . MMR and varicella combined vaccine subcutaneous   Gross side effects, risk and benefits, and alternatives of medications and treatment plan in general discussed with patient.  Patient is aware that all medications have potential side effects and we are unable to predict every side effect or drug-drug interaction that may occur.   Patient will call with any questions prior to using medication if they have concerns.  Expresses verbal understanding and consents to current therapy and treatment regimen.  No barriers to understanding were identified.  Red flag symptoms and signs discussed in detail.  Patient expressed understanding regarding what to do in case of emergency\urgent symptoms  Please see AVS handed out to patient at the end of our visit for further patient instructions/ counseling done pertaining to today's office visit.   Return for F-up of current med issues as previously d/c pt.    Note: This note was prepared with assistance of Dragon voice recognition software. Occasional wrong-word or sound-a-like substitutions may have occurred due to the inherent limitations of voice recognition software.   This document serves as a record of services personally performed by Mellody Dance, DO. It was created on her behalf by [name], a trained medical scribe. The creation of this record is based on the scribe's personal observations and the provider's statements to them.   I have reviewed the above  medical documentation for accuracy and completeness and I concur.  Mellody Dance 07/12/17 4:33 PM   ----------------------------------------------------------------------------------------------------    Subjective:     HPI: Kyle Simon is a 84 y.o. male who presents to [place] at [facility] today for issues as discussed below.  Here today to discuss concerns about the MMR vaccine, especially given his travels, and since he has heard about the emergence of recent cases.  Patient notes "the test came back and said I was not immune.  Do I take it or do I not take it?"  He plans on travelling to [place] in the near future.  "I'm interested in [place].  Last year we went to the [facility]."  Patient has been feeling well overall, working 5 hours a day outside on a regular basis.  Feels ready to walk around during his travels.    Wt Readings from Last 3 Encounters:  07/12/17 192 lb 11.2 oz (87.4 kg)  05/18/17 190 lb 6.4 oz (86.4 kg)  04/29/17 192 lb 6.4 oz (87.3 kg)   BP Readings from Last 3 Encounters:  07/12/17 (!) 142/80  05/18/17 117/70  04/29/17 124/78   Pulse Readings from Last 3 Encounters:  07/12/17 63  05/18/17 86  04/29/17 (!) 51   BMI Readings from Last 3 Encounters:  07/12/17 26.88 kg/m  05/18/17 26.56 kg/m  04/29/17 26.83 kg/m     Patient Care Team    Relationship Specialty Notifications Start End  Mellody Dance, DO PCP - General Family Medicine  08/05/15   Risa Grill,  Shanon Brow, MD Consulting Physician Urology  11/10/15   Lavonna Monarch, MD Consulting Physician Dermatology  6/62/94   Jodi Marble, MD Consulting Physician Otolaryngology  11/10/15   Calton Dach, MD Referring Physician Optometry  11/10/15   Inocencio Homes, [place] Consulting Physician Podiatry  11/10/15   Bo Merino, MD Consulting Physician Rheumatology  11/10/15   Vickey Huger, MD Consulting Physician Orthopedic Surgery  11/10/15   Erline Levine, MD Consulting Physician  Neurosurgery  11/10/15   Deboraha Sprang, MD Consulting Physician Cardiology  01/04/16    Comment: seen '12 last  Elayne Snare, MD Consulting Physician Endocrinology  04/14/17   Kathrynn Ducking, MD Consulting Physician Neurology  7/65/46   Jodi Marble, MD Consulting Physician Otolaryngology  04/22/17      Patient Active Problem List   Diagnosis Date Noted  . 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 Diabetes mellitus without complication ([place]) 50/35/4656    Priority: High  . Hypertension associated with diabetes ([place]) 03/20/2014    Priority: High  . Hyperlipidemia associated with type 2 diabetes mellitus ([place]) 04/19/2007    Priority: High  . H/O total knee replacement, bilateral 11/22/2016    Priority: Medium  . Hypomagnesemia 12/29/2015    Priority: Medium  . h/o Hyponatremia 12/29/2015    Priority: Medium  . BPH (benign prostatic hyperplasia) 04/18/2007    Priority: Medium  . Hypothyroidism 04/20/2006    Priority: Medium  . Age-related osteoporosis without current pathological fracture 07/16/2016    Priority: Low  . Flatulence symptom 07/25/2015    Priority: Low  . Diarrhea 06/21/2015    Priority: Low  . B12 deficiency anemia 01/22/2013    Priority: Low  . Anemia, unspecified 08/13/2012    Priority: Low  . GERD (gastroesophageal reflux disease) 03/26/2011    Priority: Low  . Lumbar radiculopathy 06/16/2017  . Total knee replacement status, right 04/14/2017  . Arthralgia of right temporomandibular joint 03/02/2017  . Chronic eczematous otitis externa of both ears 03/02/2017  . Presbycusis of both ears 03/02/2017  . ETD (Eustachian tube dysfunction), right 09/15/2016  . Strain of masseter muscle 09/15/2016  . Trochanteric bursitis of right hip 09/07/2016  . History of lumbar fusion 07/16/2016  . History of total knee arthroplasty, left 07/16/2016  . Cough 01/04/2016  . High risk medications (not anticoagulants)  long-term use 01/04/2016  . Cerumen impaction 08/25/2015  . Chronic rhinitis 11/01/2014  . Spondylolisthesis of lumbar region 10/23/2013  . Diverticulosis of colon without hemorrhage 01/22/2013  . Synovial cyst of lumbar spine 07/26/2011  . Spasms of the hands or feet 07/26/2011  . SENILE KERATOSIS 06/12/2008  . SNORING, HX OF 04/19/2007    Past Medical history, Surgical history, Family history, Social history, Allergies and Medications have been entered into the medical record, reviewed and changed as needed.    Current Meds  Medication Sig  . acetaminophen (TYLENOL) 500 MG tablet Take 1,000 mg by mouth as needed for mild pain.   Marland Kitchen aspirin 81 MG tablet Take 81 mg by mouth daily.  . Calcium Carb-Cholecalciferol (CALCIUM 600 + D PO) Take 1 tablet by mouth every evening.  Marland Kitchen CALCIUM-MAGNESIUM-ZINC PO Take 1 tablet by mouth daily.  Marland Kitchen glucose blood (PRECISION XTRA TEST STRIPS) test strip USE TO CHECK BLOOD SUGAR ONCE DAILY DX E11.9  . Hypromellose (ARTIFICIAL TEARS OP) Place 1 drop into both eyes 2 (two) times daily.  Marland Kitchen LANCETS ULTRA THIN 30G MISC Patient  is to use to check blood glucose in the AM fasting and 2 hours after largest meal.  . levothyroxine (SYNTHROID) 150 MCG tablet Take 1 tablet (150 mcg total) by mouth daily before breakfast.  . losartan (COZAAR) 50 MG tablet TAKE ONE-HALF (1/2) TABLET EVERY OTHER DAY  . Multiple Vitamin (MULTIVITAMIN WITH MINERALS) TABS Take 1 tablet by mouth daily.  . NONFORMULARY OR COMPOUNDED ITEM Glucometer Museum/gallery curator XTRA- Model #[id number])  . Omega-3 Fatty Acids (FISH OIL) 1000 MG CAPS Take 1,000 mg by mouth every evening.  . Potassium 99 MG TABS Take 99 mg by mouth daily.  . Probiotic Product (RESTORA) CAPS One tab qd (Patient taking differently: Take 1 capsule by mouth daily. One tab qd)  . Tamsulosin HCl (FLOMAX) 0.4 MG CAPS Take 0.4 mg by mouth 2 (two) times daily.   . vitamin B-12 (CYANOCOBALAMIN) 1000 MCG tablet Take 1,000 mcg by mouth daily.     Allergies:  Allergies  Allergen Reactions  . Simvastatin Other (See Comments)    MYALGIAS  . Oxycodone Other (See Comments)    Severe constipation     Review of Systems:  A fourteen system review of systems was performed and found to be positive as per HPI.   Objective:   Blood pressure (!) 142/80, pulse 63, height 5' 11"  (1.803 m), weight 192 lb 11.2 oz (87.4 kg). Body mass index is 26.88 kg/m.   General: Well Developed, well nourished, and in no acute distress.  HEENT: Normocephalic, atraumatic Skin: Warm and dry, cap RF less 2 sec, good turgor Chest:  Normal excursion, shape, no gross abn Respiratory: speaking in full sentences, no conversational dyspnea NeuroM-Sk: Ambulates w/o assistance, moves * 4 Psych: A and O *3, insight good, mood-full

## 2017-07-15 DIAGNOSIS — M7061 Trochanteric bursitis, right hip: Secondary | ICD-10-CM | POA: Diagnosis not present

## 2017-07-18 DIAGNOSIS — R3912 Poor urinary stream: Secondary | ICD-10-CM | POA: Diagnosis not present

## 2017-07-18 DIAGNOSIS — N401 Enlarged prostate with lower urinary tract symptoms: Secondary | ICD-10-CM | POA: Diagnosis not present

## 2017-07-18 DIAGNOSIS — R351 Nocturia: Secondary | ICD-10-CM | POA: Diagnosis not present

## 2017-08-02 ENCOUNTER — Ambulatory Visit: Payer: Medicare Other | Admitting: Rheumatology

## 2017-08-11 ENCOUNTER — Ambulatory Visit: Payer: Medicare Other | Admitting: Family Medicine

## 2017-08-22 ENCOUNTER — Ambulatory Visit (INDEPENDENT_AMBULATORY_CARE_PROVIDER_SITE_OTHER): Payer: Medicare Other | Admitting: Family Medicine

## 2017-08-22 VITALS — BP 162/77 | HR 50 | Ht 71.0 in | Wt 193.1 lb

## 2017-08-22 DIAGNOSIS — R5383 Other fatigue: Secondary | ICD-10-CM | POA: Insufficient documentation

## 2017-08-22 DIAGNOSIS — N183 Chronic kidney disease, stage 3 unspecified: Secondary | ICD-10-CM

## 2017-08-22 DIAGNOSIS — M25562 Pain in left knee: Secondary | ICD-10-CM | POA: Insufficient documentation

## 2017-08-22 DIAGNOSIS — D509 Iron deficiency anemia, unspecified: Secondary | ICD-10-CM | POA: Diagnosis not present

## 2017-08-22 DIAGNOSIS — E1159 Type 2 diabetes mellitus with other circulatory complications: Secondary | ICD-10-CM | POA: Diagnosis not present

## 2017-08-22 DIAGNOSIS — E039 Hypothyroidism, unspecified: Secondary | ICD-10-CM | POA: Diagnosis not present

## 2017-08-22 DIAGNOSIS — I10 Essential (primary) hypertension: Secondary | ICD-10-CM

## 2017-08-22 DIAGNOSIS — Z6379 Other stressful life events affecting family and household: Secondary | ICD-10-CM | POA: Insufficient documentation

## 2017-08-22 DIAGNOSIS — G8929 Other chronic pain: Secondary | ICD-10-CM | POA: Insufficient documentation

## 2017-08-22 DIAGNOSIS — E119 Type 2 diabetes mellitus without complications: Secondary | ICD-10-CM

## 2017-08-22 DIAGNOSIS — I152 Hypertension secondary to endocrine disorders: Secondary | ICD-10-CM

## 2017-08-22 DIAGNOSIS — M25561 Pain in right knee: Secondary | ICD-10-CM

## 2017-08-22 DIAGNOSIS — Z723 Lack of physical exercise: Secondary | ICD-10-CM | POA: Insufficient documentation

## 2017-08-22 DIAGNOSIS — D519 Vitamin B12 deficiency anemia, unspecified: Secondary | ICD-10-CM | POA: Diagnosis not present

## 2017-08-22 LAB — POCT GLYCOSYLATED HEMOGLOBIN (HGB A1C): Hemoglobin A1C: 6.1 % — AB (ref 4.0–5.6)

## 2017-08-22 MED ORDER — LOSARTAN POTASSIUM 50 MG PO TABS: 25.0000 mg | ORAL_TABLET | Freq: Every day | ORAL | 1 refills | Status: DC

## 2017-08-22 NOTE — Patient Instructions (Addendum)
Please take your losartan blood pressure medicine one half tab daily.  Please monitor your blood pressure and write it down in a blood pressure log.    Follow-up in approximately 4-6 weeks so we can review your blood pressures and see if additional modifications in your medications are needed.    Hypertension Hypertension, commonly called high blood pressure, is when the force of blood pumping through the arteries is too strong. The arteries are the blood vessels that carry blood from the heart throughout the body. Hypertension forces the heart to work harder to pump blood and may cause arteries to become narrow or stiff. Having untreated or uncontrolled hypertension can cause heart attacks, strokes, kidney disease, and other problems. A blood pressure reading consists of a higher number over a lower number. Ideally, your blood pressure should be below 120/80. The first ("top") number is called the systolic pressure. It is a measure of the pressure in your arteries as your heart beats. The second ("bottom") number is called the diastolic pressure. It is a measure of the pressure in your arteries as the heart relaxes. What are the causes? The cause of this condition is not known. What increases the risk? Some risk factors for high blood pressure are under your control. Others are not. Factors you can change  Smoking.  Having type 2 diabetes mellitus, high cholesterol, or both.  Not getting enough exercise or physical activity.  Being overweight.  Having too much fat, sugar, calories, or salt (sodium) in your diet.  Drinking too much alcohol. Factors that are difficult or impossible to change  Having chronic kidney disease.  Having a family history of high blood pressure.  Age. Risk increases with age.  Race. You may be at higher risk if you are African-American.  Gender. Men are at higher risk than women before age 33. After age 36, women are at higher risk than men.  Having  obstructive sleep apnea.  Stress. What are the signs or symptoms? Extremely high blood pressure (hypertensive crisis) may cause:  Headache.  Anxiety.  Shortness of breath.  Nosebleed.  Nausea and vomiting.  Severe chest pain.  Jerky movements you cannot control (seizures).  How is this diagnosed? This condition is diagnosed by measuring your blood pressure while you are seated, with your arm resting on a surface. The cuff of the blood pressure monitor will be placed directly against the skin of your upper arm at the level of your heart. It should be measured at least twice using the same arm. Certain conditions can cause a difference in blood pressure between your right and left arms. Certain factors can cause blood pressure readings to be lower or higher than normal (elevated) for a short period of time:  When your blood pressure is higher when you are in a health care provider's office than when you are at home, this is called white coat hypertension. Most people with this condition do not need medicines.  When your blood pressure is higher at home than when you are in a health care provider's office, this is called masked hypertension. Most people with this condition may need medicines to control blood pressure.  If you have a high blood pressure reading during one visit or you have normal blood pressure with other risk factors:  You may be asked to return on a different day to have your blood pressure checked again.  You may be asked to monitor your blood pressure at home for 1 week or longer.  If you are diagnosed with hypertension, you may have other blood or imaging tests to help your health care provider understand your overall risk for other conditions. How is this treated? This condition is treated by making healthy lifestyle changes, such as eating healthy foods, exercising more, and reducing your alcohol intake. Your health care provider may prescribe medicine if  lifestyle changes are not enough to get your blood pressure under control, and if:  Your systolic blood pressure is above 130.  Your diastolic blood pressure is above 80.  Your personal target blood pressure may vary depending on your medical conditions, your age, and other factors. Follow these instructions at home: Eating and drinking  Eat a diet that is high in fiber and potassium, and low in sodium, added sugar, and fat. An example eating plan is called the DASH (Dietary Approaches to Stop Hypertension) diet. To eat this way: ? Eat plenty of fresh fruits and vegetables. Try to fill half of your plate at each meal with fruits and vegetables. ? Eat whole grains, such as whole wheat pasta, brown rice, or whole grain bread. Fill about one quarter of your plate with whole grains. ? Eat or drink low-fat dairy products, such as skim milk or low-fat yogurt. ? Avoid fatty cuts of meat, processed or cured meats, and poultry with skin. Fill about one quarter of your plate with lean proteins, such as fish, chicken without skin, beans, eggs, and tofu. ? Avoid premade and processed foods. These tend to be higher in sodium, added sugar, and fat.  Reduce your daily sodium intake. Most people with hypertension should eat less than 1,500 mg of sodium a day.  Limit alcohol intake to no more than 1 drink a day for nonpregnant women and 2 drinks a day for men. One drink equals 12 oz of beer, 5 oz of wine, or 1 oz of hard liquor. Lifestyle  Work with your health care provider to maintain a healthy body weight or to lose weight. Ask what an ideal weight is for you.  Get at least 30 minutes of exercise that causes your heart to beat faster (aerobic exercise) most days of the week. Activities may include walking, swimming, or biking.  Include exercise to strengthen your muscles (resistance exercise), such as pilates or lifting weights, as part of your weekly exercise routine. Try to do these types of exercises  for 30 minutes at least 3 days a week.  Do not use any products that contain nicotine or tobacco, such as cigarettes and e-cigarettes. If you need help quitting, ask your health care provider.  Monitor your blood pressure at home as told by your health care provider.  Keep all follow-up visits as told by your health care provider. This is important. Medicines  Take over-the-counter and prescription medicines only as told by your health care provider. Follow directions carefully. Blood pressure medicines must be taken as prescribed.  Do not skip doses of blood pressure medicine. Doing this puts you at risk for problems and can make the medicine less effective.  Ask your health care provider about side effects or reactions to medicines that you should watch for. Contact a health care provider if:  You think you are having a reaction to a medicine you are taking.  You have headaches that keep coming back (recurring).  You feel dizzy.  You have swelling in your ankles.  You have trouble with your vision. Get help right away if:  You develop a severe headache  or confusion.  You have unusual weakness or numbness.  You feel faint.  You have severe pain in your chest or abdomen.  You vomit repeatedly.  You have trouble breathing. Summary  Hypertension is when the force of blood pumping through your arteries is too strong. If this condition is not controlled, it may put you at risk for serious complications.  Your personal target blood pressure may vary depending on your medical conditions, your age, and other factors. For most people, a normal blood pressure is less than 120/80.  Hypertension is treated with lifestyle changes, medicines, or a combination of both. Lifestyle changes include weight loss, eating a healthy, low-sodium diet, exercising more, and limiting alcohol. This information is not intended to replace advice given to you by your health care provider. Make sure you  discuss any questions you have with your health care provider. Document Released: 02/15/2005 Document Revised: 01/14/2016 Document Reviewed: 01/14/2016 Elsevier Interactive Patient Education  2018 [employer].    How to Take Your Blood Pressure You can take your blood pressure at home with a machine. You may need to check your blood pressure at home:  To check if you have high blood pressure (hypertension).  To check your blood pressure over time.  To make sure your blood pressure medicine is working.  Supplies needed: You will need a blood pressure machine, or monitor. You can buy one at a drugstore or online. When choosing one:  Choose one with an arm cuff.  Choose one that wraps around your upper arm. Only one finger should fit between your arm and the cuff.  Do not choose one that measures your blood pressure from your wrist or finger.  Your doctor can suggest a monitor. How to prepare Avoid these things for 30 minutes before checking your blood pressure:  Drinking caffeine.  Drinking alcohol.  Eating.  Smoking.  Exercising.  Five minutes before checking your blood pressure:  Pee.  Sit in a dining chair. Avoid sitting in a soft couch or armchair.  Be quiet. Do not talk.  How to take your blood pressure Follow the instructions that came with your machine. If you have a digital blood pressure monitor, these may be the instructions: 1. Sit up straight. 2. Place your feet on the floor. Do not cross your ankles or legs. 3. Rest your left arm at the level of your heart. You may rest it on a table, desk, or chair. 4. Pull up your shirt sleeve. 5. Wrap the blood pressure cuff around the upper part of your left arm. The cuff should be 1 inch (2.5 cm) above your elbow. It is best to wrap the cuff around bare skin. 6. Fit the cuff snugly around your arm. You should be able to place only one finger between the cuff and your arm. 7. Put the cord inside the groove of  your elbow. 8. Press the power button. 9. Sit quietly while the cuff fills with air and loses air. 10. Write down the numbers on the screen. 11. Wait 2-3 minutes and then repeat steps 1-10.  What do the numbers mean? Two numbers make up your blood pressure. The first number is called systolic pressure. The second is called diastolic pressure. An example of a blood pressure reading is "120 over 80" (or 120/80). If you are an adult and do not have a medical condition, use this guide to find out if your blood pressure is normal: Normal  First number: below 120.  Second number: below 80. Elevated  First number: 120-129.  Second number: below 80. Hypertension stage 1  First number: 130-139.  Second number: 80-89. Hypertension stage 2  First number: 140 or above.  Second number: 45 or above. Your blood pressure is above normal even if only the top or bottom number is above normal. Follow these instructions at home:  Check your blood pressure as often as your doctor tells you to.  Take your monitor to your next doctor's appointment. Your doctor will: ? Make sure you are using it correctly. ? Make sure it is working right.  Make sure you understand what your blood pressure numbers should be.  Tell your doctor if your medicines are causing side effects. Contact a doctor if:  Your blood pressure keeps being high. Get help right away if:  Your first blood pressure number is higher than 180.  Your second blood pressure number is higher than 120. This information is not intended to replace advice given to you by your health care provider. Make sure you discuss any questions you have with your health care provider. Document Released: 01/29/2008 Document Revised: 01/14/2016 Document Reviewed: 07/25/2015 Elsevier Interactive Patient Education  [employer].

## 2017-08-22 NOTE — Progress Notes (Signed)
Impression and Recommendations:    1. Diabetes mellitus without complication ([place])   2. Fatigue, unspecified type   3. 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 Anemia due to vitamin B12 deficiency, unspecified B12 deficiency type   5. Hypothyroidism, unspecified type   6. Iron deficiency anemia, unspecified iron deficiency anemia type   7. Hypertension associated with diabetes ([place])   8. Chronic pain of right knee   9. Inactivity-recently due to knee pain   10. Stressful life event affecting family-  loss of son May 31st     1. Diabetes Mellitus - A1C down to 6.1 - Controlled at this time.  No changes to treatment made today.  - If patient wishes to check his blood glucose once weekly, encouraged to check fasting values after he's eaten especially poorly the night before, or 2 hours post-prandial after he's eaten particularly poorly.  - Counseled patient on pathophysiology of disease and discussed various treatment options, which includes continued dietary and lifestyle modifications as first line.  Importance of low carb/ketogenic diet discussed with patient in addition to regular exercise.   - Being a diabetic, you need yearly eye and foot exams. Make appt.for diabetic eye exam  2. Blood Pressure - Discussed that if patient's blood pressure is running above 140/90 on a regular basis, he should begin taking a half-tablet of losartan every day.  - Encouraged patient to begin ambulatory blood pressure monitoring.  Sit quietly with feet on the floor 15-20 minutes prior to checking it.  Do not drink caffeine or stimulate blood pressure prior.  - Keep a blood pressure log and bring it to follow-up appointment in 4-6 weeks.  - Reviewed goal blood pressure should be consistently less than 140/90.  3. Orthopedic concerns - Encouraged patient to continue following up with specialists ([name], [name]) as established for his orthopedic concerns.  -  Strongly advised patient to request physical therapy and ongoing follow-up with specialists.  - Continue on probiotics for concerns with bowel movements related to orthopedic pain management.  4. Preventative Health Maintenance - Advised patient to continue working toward exercising to improve health.  Encouraged the patient to eat better while he recuperates from the loss of his son, and resume activity that doesn't aggravate his knee.  Encouraged patient to enjoy low-impact activities such as swimming during the summer.  - Patient will begin with 15 minutes of activity daily.  Recommended that the patient eventually strive for at least 150 minutes of cardio per week according to the [facility].   - Healthy dietary habits encouraged, including low-carb, and high amounts of lean protein in diet.   - Patient should also consume adequate amounts of water - half of body weight in oz of water per day.  5. Follow-up - Return for follow-up on blood pressure 4-6 weeks as scheduled with BP log, to evaluate change in losartan dose.  - Patient knows to call for any acute concerns, and return sooner than scheduled if he wishes for assistance with his mood.  - CBC & CMP ordered today due to patient complaints of fatigue.  Patient believes his lack of energy is due to his lack of activity and his son's death less than a month ago.   Education and routine counseling performed. Handouts provided.  Orders Placed This Encounter  Procedures  . CBC with Differential/Platelet  . Comprehensive metabolic panel  . B12 and Folate Panel  . TSH  .  T4, free  . POCT glycosylated hemoglobin (Hb A1C)    Meds ordered this encounter  Medications  . losartan (COZAAR) 50 MG tablet    Sig: Take 0.5 tablets (25 mg total) by mouth daily.    Dispense:  90 tablet    Refill:  1    Return for 4-6 wks for bp reck after inc losartan to QD - bring log.   The patient was counseled, risk factors were discussed, anticipatory  guidance given.  Gross side effects, risk and benefits, and alternatives of medications discussed with patient.  Patient is aware that all medications have potential side effects and we are unable to predict every side effect or drug-drug interaction that may occur.  Expresses verbal understanding and consents to current therapy plan and treatment regimen.  Please see AVS handed out to patient at the end of our visit for further patient instructions/ counseling done pertaining to today's office visit.    Note: This document was prepared using Dragon voice recognition software and may include unintentional dictation errors.  This document serves as a record of services personally performed by Mellody Dance, DO. It was created on her behalf by [name], a trained medical scribe. The creation of this record is based on the scribe's personal observations and the provider's statements to them.   I have reviewed the above medical documentation for accuracy and completeness and I concur.  Mellody Dance 08/28/17 9:56 PM     Subjective:    Chief Complaint  Patient presents with  . Follow-up   Kyle Simon is a 84 y.o. male who presents to [place] [facility] today for Diabetes Management.    Fatigue & Inactive Lifestyle Patient notes that since he lost his son on May 31st, and over the last five months in general, he's felt fatigued and hasn't gotten as much exercise.  States that his knee problems also interfere with his ability to exercise and eat right.  Admits that he's been eating more than he should due to stress and grief; states "I recognize it, but I don't care; I just eat."  Patient laments his lack of activity, his inability to enjoy or engage in his hobbies such as aviation, golf, etc.  Notes "I've never been this still in my life."  He notes that he might join the [employer] again in the near future.  Patient believes his fatigue is due to grief and lack  of physical activity.  Orthopedic Concerns Patient follows up with [name] for orthopedics and to manage his knee, hip, and back concerns.  He's also consulted with Dr. Erline Levine.  He has tried to use ice to manage pain in his knee, but states that this does not control his symptoms.  He has used ibuprofen, but remarks "it eats up my stomach."  He uses tramadol as prescribed to more significant relief, but this "messes with his bowel movements."  He continues taking probiotics to help manage his bowel concerns.  Blood Pressure Patient believes his blood pressure may be elevated today because he is very frustrated with his orthopedic concerns and related medical treatment.  Further remarks that he's had about four cups of coffee today, which is "less than he usually drinks."  Patient states that he takes a half tablet of losartan every other day.  He occasionally checks his blood pressure at home.  States that it runs "about 140-145."  DM HPI: -  He has not been working  on diet and exercise for diabetes  Pt is currently maintained on the following medications for diabetes:   see med list today Medication compliance - Continues diet-controlled treatment as prescribed.  Home glucose readings range: 110-115. He is checking his fasting blood sugars once a week. Post-prandial readings are running about 135-140.   Denies polyuria/polydipsia. Denies hypo/ hyperglycemia symptoms - He denies new onset of: chest pain, exercise intolerance, shortness of breath, dizziness, visual changes, headache, lower extremity swelling or claudication.   Last diabetic eye exam was  Lab Results  Component Value Date   HMDIABEYEEXA No Retinopathy 01/11/2016    Foot exam- UTD  Last A1C in the office was:  Lab Results  Component Value Date   HGBA1C 6.1 (A) 08/22/2017   HGBA1C 6.5 04/14/2017   HGBA1C 6.4 (H) 09/15/2016    Lab Results  Component Value Date   MICROALBUR 10 04/14/2017   LDLCALC 98  09/15/2016   CREATININE 0.95 08/22/2017      Last 3 blood pressure readings in our office are as follows: BP Readings from Last 3 Encounters:  08/22/17 (!) 162/77  07/12/17 (!) 142/80  05/18/17 117/70    BMI Readings from Last 3 Encounters:  08/22/17 26.93 kg/m  07/12/17 26.88 kg/m  05/18/17 26.56 kg/m     No problems updated.    Patient Care Team    Relationship Specialty Notifications Start End  Mellody Dance, DO PCP - General Family Medicine  08/05/15   Rana Snare, MD Consulting Physician Urology  11/10/15   Lavonna Monarch, MD Consulting Physician Dermatology  8/46/96   Jodi Marble, MD Consulting Physician Otolaryngology  11/10/15   Calton Dach, MD Referring Physician Optometry  11/10/15   Inocencio Homes, [place] Consulting Physician Podiatry  11/10/15   Bo Merino, MD Consulting Physician Rheumatology  11/10/15   Vickey Huger, MD Consulting Physician Orthopedic Surgery  11/10/15   Erline Levine, MD Consulting Physician Neurosurgery  11/10/15   Deboraha Sprang, MD Consulting Physician Cardiology  01/04/16    Comment: seen '12 last  Elayne Snare, MD Consulting Physician Endocrinology  04/14/17   Kathrynn Ducking, MD Consulting Physician Neurology  2/95/28   Jodi Marble, MD Consulting Physician Otolaryngology  04/22/17      Patient Active Problem List   Diagnosis Date Noted  . DM assoc with CKD (chronic kidney disease), stage III ([place]) 04/14/2017    Priority: High  . Diabetes mellitus without complication ([place]) 41/32/4401    Priority: High  . Hypertension associated with diabetes ([place]) 03/20/2014    Priority: High  . Hyperlipidemia associated with type 2 diabetes mellitus ([place]) 04/19/2007    Priority: High  . H/O total knee replacement, bilateral 11/22/2016    Priority: Medium  . Hypomagnesemia 12/29/2015    Priority: Medium  . h/o Hyponatremia 12/29/2015    Priority: Medium  . BPH (benign prostatic hyperplasia) 04/18/2007    Priority: Medium    . Hypothyroidism 04/20/2006    Priority: Medium  . Age-related osteoporosis without current pathological fracture 07/16/2016    Priority: Low  . Flatulence symptom 07/25/2015    Priority: Low  . Diarrhea 06/21/2015    Priority: Low  . B12 deficiency anemia 01/22/2013    Priority: Low  . Anemia, unspecified 08/13/2012    Priority: Low  . GERD (gastroesophageal reflux disease) 03/26/2011    Priority: Low  . Fatigue 08/22/2017  . Chronic pain of right knee 08/22/2017  . Stressful life event affecting family-loss on May 31st 08/22/2017  .  Inactivity-recently due to knee pain 08/22/2017  . Lumbar radiculopathy 06/16/2017  . Total knee replacement status, right 04/14/2017  . Arthralgia of right temporomandibular joint 03/02/2017  . Chronic eczematous otitis externa of both ears 03/02/2017  . Presbycusis of both ears 03/02/2017  . ETD (Eustachian tube dysfunction), right 09/15/2016  . Strain of masseter muscle 09/15/2016  . Trochanteric bursitis of right hip 09/07/2016  . History of lumbar fusion 07/16/2016  . History of total knee arthroplasty, left 07/16/2016  . Cough 01/04/2016  . High risk medications (not anticoagulants) long-term use 01/04/2016  . Cerumen impaction 08/25/2015  . Chronic rhinitis 11/01/2014  . Spondylolisthesis of lumbar region 10/23/2013  . Diverticulosis of colon without hemorrhage 01/22/2013  . Synovial cyst of lumbar spine 07/26/2011  . Spasms of the hands or feet 07/26/2011  . SENILE KERATOSIS 06/12/2008  . SNORING, HX OF 04/19/2007     Past Medical History:  Diagnosis Date  . Arthritis    BOTH KNEES and hands;Dr.Deveshwar  . Constipation due to pain medication   . Diabetes mellitus without complication ([place])    Type II. Uses no medication. Pt controls with diet and weight  . Enlarged prostate   . Frequent urination at night   . GERD (gastroesophageal reflux disease)   . Hyperlipidemia   . Hypertension   . Hypothyroidism   . Polio    as a  child w/o complications  . Snoring    no sleep apnea  . Torn meniscus   . Transfusion history 2012   post TKR     Past Surgical History:  Procedure Laterality Date  . BACK SURGERY  2011   hemiarthroplasty 01/11/1999 and 6  . CARDIAC CATHETERIZATION  09/2001   negative  . EYE SURGERY  2003   R&L cataract removed & IOL- 2003 & 2007,Dr.Jenkins  . INGUINAL HERNIA REPAIR  2002   right, Dr. Truitt Leep  . JOINT REPLACEMENT  2012   left knee replacement  . LIPOMA EXCISION     back  . MENISCECTOMY Bilateral    Dr. Noemi Chapel  . PROSTATE SURGERY  07/2006   for urinary urgency, Dr. Risa Grill  . ROTATOR CUFF REPAIR Right October, 2016  . sinus & throat surgery-1995  1995  . TONSILLECTOMY    . TOTAL KNEE ARTHROPLASTY  01/11/2011   Procedure: TOTAL KNEE ARTHROPLASTY;  Surgeon: Rudean Haskell, MD;  Location: [facility];  Service: Orthopedics;  Laterality: Left;  . TOTAL KNEE ARTHROPLASTY Right 11/22/2016   Procedure: TOTAL KNEE ARTHROPLASTY;  Surgeon: Vickey Huger, MD;  Location: [facility];  Service: Orthopedics;  Laterality: Right;  . UVULECTOMY  [id number]   Dr. Erik Obey  . VASECTOMY    . WRIST SURGERY  1959   fracture- right     Family History  Problem Relation Age of Onset  . Sudden death Father        MVA  . Coronary artery disease Mother   . Emphysema Mother   . Diverticulosis Mother   . Thyroid disease Mother   . Asthma Brother   . Bone cancer Brother   . Diabetes Paternal Grandmother   . Sarcoidosis Son   . Thyroid disease Daughter   . Anesthesia problems Neg Hx   . Hypotension Neg Hx   . Malignant hyperthermia Neg Hx   . Pseudochol deficiency Neg Hx   . Colon cancer Neg Hx      Social History   Substance and Sexual Activity  Drug Use No  ,  Social History  Substance and Sexual Activity  Alcohol Use Yes  . Alcohol/week: 2.4 oz  . Types: 4 Shots of liquor per week   Comment: social  ,  Social History   Tobacco Use  Smoking Status Former Smoker  . Packs/day: 1.00  .  Years: 18.00  . Pack years: 18.00  . Last attempt to quit: 03/01/1966  . Years since quitting: 51.5  Smokeless Tobacco Never Used  ,    Current Outpatient Medications on File Prior to Visit  Medication Sig Dispense Refill  . acetaminophen (TYLENOL) 500 MG tablet Take 1,000 mg by mouth as needed for mild pain.     Marland Kitchen aspirin 81 MG tablet Take 81 mg by mouth daily.    . Calcium Carb-Cholecalciferol (CALCIUM 600 + D PO) Take 1 tablet by mouth every evening.    Marland Kitchen CALCIUM-MAGNESIUM-ZINC PO Take 1 tablet by mouth daily.    Marland Kitchen glucose blood (PRECISION XTRA TEST STRIPS) test strip USE TO CHECK BLOOD SUGAR ONCE DAILY DX E11.9 100 each 2  . Hypromellose (ARTIFICIAL TEARS OP) Place 1 drop into both eyes 2 (two) times daily.    Marland Kitchen ibuprofen (ADVIL,MOTRIN) 600 MG tablet Take 600 mg by mouth every 4 (four) hours as needed.    Marland Kitchen LANCETS ULTRA THIN 30G MISC Patient is to use to check blood glucose in the AM fasting and 2 hours after largest meal. 100 each 6  . levothyroxine (SYNTHROID) 150 MCG tablet Take 1 tablet (150 mcg total) by mouth daily before breakfast. 90 tablet 0  . Multiple Vitamin (MULTIVITAMIN WITH MINERALS) TABS Take 1 tablet by mouth daily.    . NONFORMULARY OR COMPOUNDED ITEM Glucometer (Precision XTRA- Model #[id number]) 1 each 0  . Omega-3 Fatty Acids (FISH OIL) 1000 MG CAPS Take 1,000 mg by mouth every evening.    . Potassium 99 MG TABS Take 99 mg by mouth daily.    . Probiotic Product (RESTORA) CAPS One tab qd (Patient taking differently: Take 1 capsule by mouth daily. One tab qd) 90 capsule 3  . Tamsulosin HCl (FLOMAX) 0.4 MG CAPS Take 0.4 mg by mouth 2 (two) times daily.     . vitamin B-12 (CYANOCOBALAMIN) 1000 MCG tablet Take 1,000 mcg by mouth daily.     No current facility-administered medications on file prior to visit.      Allergies  Allergen Reactions  . Simvastatin Other (See Comments)    MYALGIAS  . Oxycodone Other (See Comments)    Severe constipation     Review  of Systems:   General:  Denies fever, chills Optho/Auditory:   Denies visual changes, blurred vision Respiratory:   Denies SOB, cough, wheeze, DIB  Cardiovascular:   Denies chest pain, palpitations, painful respirations Gastrointestinal:   Denies nausea, vomiting, diarrhea.  Endocrine:     Denies new hot or cold intolerance Musculoskeletal:  Denies joint swelling, gait issues, or new unexplained myalgias/ arthralgias Skin:  Denies rash, suspicious lesions  Neurological:    Denies dizziness, unexplained weakness, numbness  Psychiatric/Behavioral:   Denies mood changes    Objective:     Blood pressure (!) 162/77, pulse (!) 50, height 5\' 11"  (1.803 m), weight 193 lb 1.6 oz (87.6 kg), SpO2 97 %.  Body mass index is 26.93 kg/m.  General: Well Developed, well nourished, and in no acute distress.  HEENT: Normocephalic, atraumatic, pupils equal round reactive to light, neck supple, No carotid bruits, no JVD Skin: Warm and dry, cap RF less 2 sec Cardiac: Regular rate  and rhythm, S1, S2 WNL's, no murmurs rubs or gallops Respiratory: ECTA B/L, Not using accessory muscles, speaking in full sentences. NeuroM-Sk: Ambulates w/o assistance, moves ext * 4 w/o difficulty, sensation grossly intact.  Ext: scant edema b/l lower ext Psych: No HI/SI, judgement and insight good, Euthymic mood. Full Affect.

## 2017-08-23 LAB — COMPREHENSIVE METABOLIC PANEL
ALT: 19 IU/L (ref 0–44)
AST: 19 IU/L (ref 0–40)
Albumin/Globulin Ratio: 1.9 (ref 1.2–2.2)
Albumin: 4.2 g/dL (ref 3.5–4.7)
Alkaline Phosphatase: 79 IU/L (ref 39–117)
BUN/Creatinine Ratio: 11 (ref 10–24)
BUN: 10 mg/dL (ref 8–27)
Bilirubin Total: 0.4 mg/dL (ref 0.0–1.2)
CO2: 23 mmol/L (ref 20–29)
Calcium: 9.1 mg/dL (ref 8.6–10.2)
Chloride: 96 mmol/L (ref 96–106)
Creatinine, Ser: 0.95 mg/dL (ref 0.76–1.27)
GFR calc Af Amer: 86 mL/min/{1.73_m2} (ref >59)
GFR calc non Af Amer: 74 mL/min/{1.73_m2} (ref >59)
Globulin, Total: 2.2 g/dL (ref 1.5–4.5)
Glucose: 122 mg/dL — ABNORMAL HIGH (ref 65–99)
Potassium: 5.5 mmol/L — ABNORMAL HIGH (ref 3.5–5.2)
Sodium: 132 mmol/L — ABNORMAL LOW (ref 134–144)
Total Protein: 6.4 g/dL (ref 6.0–8.5)

## 2017-08-23 LAB — B12 AND FOLATE PANEL
Folate: >20 ng/mL (ref >3.0)
Vitamin B-12: 1402 pg/mL — ABNORMAL HIGH (ref 232–1245)

## 2017-08-23 LAB — CBC WITH DIFFERENTIAL/PLATELET
Basophils Absolute: 0 10*3/uL (ref 0.0–0.2)
Basos: 0 %
EOS (ABSOLUTE): 0.1 10*3/uL (ref 0.0–0.4)
Eos: 2 %
Hematocrit: 37.2 % — ABNORMAL LOW (ref 37.5–51.0)
Hemoglobin: 12.1 g/dL — ABNORMAL LOW (ref 13.0–17.7)
Immature Grans (Abs): 0 10*3/uL (ref 0.0–0.1)
Immature Granulocytes: 0 %
Lymphocytes Absolute: 1.1 10*3/uL (ref 0.7–3.1)
Lymphs: 23 %
MCH: 30.1 pg (ref 26.6–33.0)
MCHC: 32.5 g/dL (ref 31.5–35.7)
MCV: 93 fL (ref 79–97)
Monocytes Absolute: 0.4 10*3/uL (ref 0.1–0.9)
Monocytes: 9 %
Neutrophils Absolute: 3.3 10*3/uL (ref 1.4–7.0)
Neutrophils: 66 %
Platelets: 250 10*3/uL (ref 150–450)
RBC: 4.02 x10E6/uL — ABNORMAL LOW (ref 4.14–5.80)
RDW: 13.7 % (ref 12.3–15.4)
WBC: 5 10*3/uL (ref 3.4–10.8)

## 2017-08-23 LAB — TSH: TSH: 6.95 u[IU]/mL — ABNORMAL HIGH (ref 0.450–4.500)

## 2017-08-23 LAB — T4, FREE: Free T4: 1.45 ng/dL (ref 0.82–1.77)

## 2017-08-29 DIAGNOSIS — M545 Low back pain: Secondary | ICD-10-CM | POA: Diagnosis not present

## 2017-08-29 DIAGNOSIS — M5416 Radiculopathy, lumbar region: Secondary | ICD-10-CM | POA: Diagnosis not present

## 2017-08-29 DIAGNOSIS — M7138 Other bursal cyst, other site: Secondary | ICD-10-CM | POA: Diagnosis not present

## 2017-08-29 DIAGNOSIS — R030 Elevated blood-pressure reading, without diagnosis of hypertension: Secondary | ICD-10-CM | POA: Diagnosis not present

## 2017-08-29 DIAGNOSIS — M4310 Spondylolisthesis, site unspecified: Secondary | ICD-10-CM | POA: Diagnosis not present

## 2017-08-29 DIAGNOSIS — Z6826 Body mass index (BMI) 26.0-26.9, adult: Secondary | ICD-10-CM | POA: Diagnosis not present

## 2017-09-06 DIAGNOSIS — M5126 Other intervertebral disc displacement, lumbar region: Secondary | ICD-10-CM | POA: Diagnosis not present

## 2017-09-06 DIAGNOSIS — M4310 Spondylolisthesis, site unspecified: Secondary | ICD-10-CM | POA: Diagnosis not present

## 2017-09-06 DIAGNOSIS — M48061 Spinal stenosis, lumbar region without neurogenic claudication: Secondary | ICD-10-CM | POA: Diagnosis not present

## 2017-09-07 ENCOUNTER — Other Ambulatory Visit: Payer: Self-pay | Admitting: Neurosurgery

## 2017-09-07 DIAGNOSIS — M9983 Other biomechanical lesions of lumbar region: Secondary | ICD-10-CM | POA: Diagnosis not present

## 2017-09-07 DIAGNOSIS — M5416 Radiculopathy, lumbar region: Secondary | ICD-10-CM | POA: Diagnosis not present

## 2017-09-07 DIAGNOSIS — Z6826 Body mass index (BMI) 26.0-26.9, adult: Secondary | ICD-10-CM | POA: Diagnosis not present

## 2017-09-07 DIAGNOSIS — M48062 Spinal stenosis, lumbar region with neurogenic claudication: Secondary | ICD-10-CM | POA: Diagnosis not present

## 2017-09-07 DIAGNOSIS — R030 Elevated blood-pressure reading, without diagnosis of hypertension: Secondary | ICD-10-CM | POA: Diagnosis not present

## 2017-09-07 DIAGNOSIS — M4310 Spondylolisthesis, site unspecified: Secondary | ICD-10-CM | POA: Diagnosis not present

## 2017-09-12 DIAGNOSIS — E1151 Type 2 diabetes mellitus with diabetic peripheral angiopathy without gangrene: Secondary | ICD-10-CM | POA: Diagnosis not present

## 2017-09-12 DIAGNOSIS — L84 Corns and callosities: Secondary | ICD-10-CM | POA: Diagnosis not present

## 2017-09-12 DIAGNOSIS — L603 Nail dystrophy: Secondary | ICD-10-CM | POA: Diagnosis not present

## 2017-09-12 DIAGNOSIS — I739 Peripheral vascular disease, unspecified: Secondary | ICD-10-CM | POA: Diagnosis not present

## 2017-09-17 ENCOUNTER — Other Ambulatory Visit: Payer: Self-pay | Admitting: Family Medicine

## 2017-09-21 NOTE — Progress Notes (Signed)
Office Visit Note  Patient: Kyle Simon             Date of Birth: 1932-05-21           MRN: [id number]             PCP: Mellody Dance, DO Referring: Mellody Dance, DO Visit Date: 10/04/2017 Occupation: @GUAROCC @  Subjective:  Lower back pain, knee pain, toe injury.  History of Present Illness: Kyle Simon is a 84 y.o. male with history of osteoporosis DDD lumbar spine and osteoarthritis.  According to patient he had bilateral total knee replacement and after his right knee replacement he continued to have right knee joint discomfort.  He states Dr. Lorre Nick felt that the right knee joint pain is coming from his lower back.  He was seen by [name] who was in agreement.  According to patient he had lumbar spine cortisone injection by Dr. Maryjean Ka today.  He is having some relief from that.  He states he will need another lumbar spine fusion in near future.  He continues to have discomfort in his lower back and needs joints.  He also had an injury to his right toe and he is concerned about his right toe currently.  Activities of Daily Living:  Patient reports morning stiffness for 4 minutes.   Patient DENIES nocturnal pain.  Difficulty dressing/grooming: Denies Difficulty climbing stairs: Reports Difficulty getting out of chair: Reports Difficulty using hands for taps, buttons, cutlery, and/or writing: Denies  Review of Systems  Constitutional: Positive for fatigue. Negative for activity change and night sweats.  HENT: Positive for mouth dryness. Negative for mouth sores and nose dryness.   Eyes: Negative for redness and dryness.  Respiratory: Negative for cough, shortness of breath and difficulty breathing.   Cardiovascular: Negative for chest pain, palpitations, hypertension, irregular heartbeat and swelling in legs/feet.  Gastrointestinal: Negative for constipation and diarrhea.  Endocrine: Negative for excessive thirst and increased urination.  Genitourinary: Negative for  difficulty urinating.  Musculoskeletal: Positive for arthralgias, gait problem, joint pain and morning stiffness. Negative for joint swelling, myalgias, muscle weakness, muscle tenderness and myalgias.  Skin: Negative for color change, rash, hair loss, nodules/bumps, skin tightness, ulcers and sensitivity to sunlight.  Allergic/Immunologic: Negative for susceptible to infections.  Neurological: Negative for dizziness, fainting, memory loss, night sweats and weakness.  Hematological: Negative for bruising/bleeding tendency and swollen glands.  Psychiatric/Behavioral: Negative for depressed mood and sleep disturbance. The patient is not nervous/anxious.     PMFS History:  Patient Active Problem List   Diagnosis Date Noted  . Fatigue 08/22/2017  . Chronic pain of right knee 08/22/2017  . Stressful life event affecting family-loss on May 31st 08/22/2017  . Inactivity-recently due to knee pain 08/22/2017  . Lumbar radiculopathy 06/16/2017  . Total knee replacement status, right 04/14/2017  . DM assoc with CKD (chronic kidney disease), stage III ([place]) 04/14/2017  . Arthralgia of right temporomandibular joint 03/02/2017  . Chronic eczematous otitis externa of both ears 03/02/2017  . Presbycusis of both ears 03/02/2017  . H/O total knee replacement, bilateral 11/22/2016  . ETD (Eustachian tube dysfunction), right 09/15/2016  . Strain of masseter muscle 09/15/2016  . Trochanteric bursitis of right hip 09/07/2016  . Age-related osteoporosis without current pathological fracture 07/16/2016  . History of lumbar fusion 07/16/2016  . History of total knee arthroplasty, left 07/16/2016  . Cough 01/04/2016  . High risk medications (not anticoagulants) long-term use 01/04/2016  . Hypomagnesemia 12/29/2015  . h/o  Hyponatremia 12/29/2015  . Cerumen impaction 08/25/2015  . Diabetes mellitus without complication ([place]) 16/11/9602  . Flatulence symptom 07/25/2015  . Diarrhea 06/21/2015  . Chronic  rhinitis 11/01/2014  . Hypertension associated with diabetes ([place]) 03/20/2014  . Spondylolisthesis of lumbar region 10/23/2013  . Diverticulosis of colon without hemorrhage 01/22/2013  . B12 deficiency anemia 01/22/2013  . Anemia, unspecified 08/13/2012  . Synovial cyst of lumbar spine 07/26/2011  . Spasms of the hands or feet 07/26/2011  . GERD (gastroesophageal reflux disease) 03/26/2011  . SENILE KERATOSIS 06/12/2008  . Hyperlipidemia associated with type 2 diabetes mellitus ([place]) 04/19/2007  . SNORING, HX OF 04/19/2007  . BPH (benign prostatic hyperplasia) 04/18/2007  . Hypothyroidism 04/20/2006    Past Medical History:  Diagnosis Date  . Arthritis    BOTH KNEES and hands;Dr.Deveshwar  . Constipation due to pain medication   . Diabetes mellitus without complication ([place])    Type II. Uses no medication. Pt controls with diet and weight  . Enlarged prostate   . Frequent urination at night   . GERD (gastroesophageal reflux disease)   . Hyperlipidemia   . Hypertension   . Hypothyroidism   . Polio    as a child w/o complications  . Snoring    no sleep apnea  . Torn meniscus   . Transfusion history 2012   post TKR    Family History  Problem Relation Age of Onset  . Sudden death Father        MVA  . Coronary artery disease Mother   . Emphysema Mother   . Diverticulosis Mother   . Thyroid disease Mother   . Asthma Brother   . Bone cancer Brother   . Diabetes Paternal Grandmother   . Sarcoidosis Son   . Thyroid disease Daughter   . Anesthesia problems Neg Hx   . Hypotension Neg Hx   . Malignant hyperthermia Neg Hx   . Pseudochol deficiency Neg Hx   . Colon cancer Neg Hx    Past Surgical History:  Procedure Laterality Date  . BACK SURGERY  2011   hemiarthroplasty 01/11/1999 and 6  . CARDIAC CATHETERIZATION  09/2001   negative  . EYE SURGERY  2003   R&L cataract removed & IOL- 2003 & 2007,Dr.Jenkins  . INGUINAL HERNIA REPAIR  2002   right, Dr. Truitt Leep  .  JOINT REPLACEMENT  2012   left knee replacement  . LIPOMA EXCISION     back  . MENISCECTOMY Bilateral    Dr. Noemi Chapel  . PROSTATE SURGERY  07/2006   for urinary urgency, Dr. Risa Grill  . ROTATOR CUFF REPAIR Right October, 2016  . sinus & throat surgery-1995  1995  . TONSILLECTOMY    . TOTAL KNEE ARTHROPLASTY  01/11/2011   Procedure: TOTAL KNEE ARTHROPLASTY;  Surgeon: Rudean Haskell, MD;  Location: [facility];  Service: Orthopedics;  Laterality: Left;  . TOTAL KNEE ARTHROPLASTY Right 11/22/2016   Procedure: TOTAL KNEE ARTHROPLASTY;  Surgeon: Vickey Huger, MD;  Location: [facility];  Service: Orthopedics;  Laterality: Right;  . UVULECTOMY  5409   Dr. Erik Obey  . VASECTOMY    . WRIST SURGERY  1959   fracture- right   Social History   Social History Narrative  . Not on file    Objective: Vital Signs: BP 135/76 (BP Location: Left Arm, Patient Position: Sitting, Cuff Size: Normal)   Pulse 80   Resp 18   Ht 5\' 11"  (1.803 m)   Wt 188 lb (85.3 kg)  BMI 26.22 kg/m    Physical Exam  Constitutional: He is oriented to person, place, and time. He appears well-developed and well-nourished.  HENT:  Head: Normocephalic and atraumatic.  Eyes: Pupils are equal, round, and reactive to light. Conjunctivae and EOM are normal.  Neck: Normal range of motion. Neck supple.  Cardiovascular: Normal rate, regular rhythm and normal heart sounds.  Pulmonary/Chest: Effort normal and breath sounds normal.  Abdominal: Soft. Bowel sounds are normal.  Neurological: He is alert and oriented to person, place, and time.  Skin: Skin is warm and dry. Capillary refill takes less than 2 seconds.  Psychiatric: He has a normal mood and affect. His behavior is normal.  Nursing note and vitals reviewed.    Musculoskeletal Exam: C-spine thoracic and lumbar spine limited range of motion.  Thoracic kyphosis was noted.  Shoulder joints elbow joints wrist joints were in good range of motion.  Mild PIP and DIP thickening in hands was  noted.  Hip joints were in good range of motion.  He had bilateral total knee replacement which continues to have some warmth and discomfort.  He had subungual hematoma of the right great toe.  CDAI Exam: No CDAI exam completed.   Investigation: No additional findings.  Imaging: Dg Chest 2 View  Result Date: 10/03/2017 CLINICAL DATA:  Exertional shortness of breath. EXAM: CHEST - 2 VIEW COMPARISON:  September 07, 2017 FINDINGS: The heart size and mediastinal contours are within normal limits. Both lungs are clear. The visualized skeletal structures are unremarkable. IMPRESSION: No active cardiopulmonary disease. Electronically Signed   By: Dorise Bullion III M.D   On: 10/03/2017 15:53    Recent Labs: Lab Results  Component Value Date   WBC 5.0 08/22/2017   HGB 12.0 (A) 10/03/2017   PLT 250 08/22/2017   NA 130 (L) 10/03/2017   K 5.1 10/03/2017   CL 94 (L) 10/03/2017   CO2 21 10/03/2017   GLUCOSE 115 (H) 10/03/2017   BUN 18 10/03/2017   CREATININE 0.93 10/03/2017   BILITOT 0.4 08/22/2017   ALKPHOS 79 08/22/2017   AST 19 08/22/2017   ALT 19 08/22/2017   PROT 6.4 08/22/2017   ALBUMIN 4.2 08/22/2017   CALCIUM 9.2 10/03/2017   GFRAA 88 10/03/2017    Speciality Comments: No specialty comments available.  Procedures:  No procedures performed Allergies: Simvastatin and Oxycodone   Assessment / Plan:     Visit Diagnoses: Age-related osteoporosis without current pathological fracture - 05/27/2015 DEXA T score -2.5 right radius.  We discussed repeat bone density.  He wants to hold off the study at this point as he has surgery coming up for his lumbar spine.  We will discuss this at the follow-up visit.  He has been taking calcium and vitamin D on a regular basis.  Spondylolisthesis of lumbar region - Status post lumbar fusion-patient will need repeat a spinal fusion.  He had cortisone injection to his back today.  H/O total knee replacement, bilateral - Right total knee replacement  11/22/2016 by Dr. Lorre Nick, left total knee replacement 2012 by Dr. Lorre Nick.  He continues to have chronic pain.  Subungual hematoma of right foot, initial encounter-he had injury which resulted in  subungual hematoma.  I have advised him to see his podiatrist.  History of anemia  History of gastroesophageal reflux (GERD)  History of hyperlipidemia  History of diabetes mellitus  History of hypertension   Orders: No orders of the defined types were placed in this encounter.  No orders  of the defined types were placed in this encounter.   Face-to-face time spent with patient was 30 minutes. Greater than 50% of time was spent in counseling and coordination of care.  Follow-Up Instructions: Return in about 6 months (around 04/06/2018) for Osteoporosis.   Bo Merino, MD  Note - This record has been created using Editor, commissioning.  Chart creation errors have been sought, but may not always  have been located. Such creation errors do not reflect on  the standard of medical care.

## 2017-09-28 ENCOUNTER — Other Ambulatory Visit: Payer: Self-pay

## 2017-09-28 ENCOUNTER — Telehealth: Payer: Self-pay | Admitting: Family Medicine

## 2017-09-28 DIAGNOSIS — I152 Hypertension secondary to endocrine disorders: Secondary | ICD-10-CM

## 2017-09-28 DIAGNOSIS — I10 Essential (primary) hypertension: Principal | ICD-10-CM

## 2017-09-28 DIAGNOSIS — E1159 Type 2 diabetes mellitus with other circulatory complications: Secondary | ICD-10-CM

## 2017-09-28 MED ORDER — LOSARTAN POTASSIUM 50 MG PO TABS: 25.0000 mg | ORAL_TABLET | Freq: Every day | ORAL | 1 refills | Status: DC

## 2017-09-28 NOTE — Telephone Encounter (Signed)
Patient called states was told to take 1/2 tab of:   losartan (COZAAR) 50 MG tablet [[id number]]   Order Details  Dose: 25 mg Route: Oral Frequency: Daily  Dispense Quantity: 90 tablet Refills: 1 Fills remaining: --        Sig: Take 0.5 tablets (25 mg total) by mouth daily.     But is about to run out & request a refill be sent to:   Preferred Pharmacies      [facility], [place] - [address] [phone] (Phone) [phone] (Fax)     ------ Patient also request provider write script for a  New diabetic glucose meter, he will call [employer] to see which one they will cover & call back.    ---Forwarding request to medical assistant.  --Dion Body

## 2017-09-28 NOTE — Telephone Encounter (Signed)
Refill sent into pharmacy and patient notified. MPulliam, CMA/RT(R)

## 2017-09-28 NOTE — Telephone Encounter (Signed)
Refill on losartan sent to mail order. MPulliam, CMA/RT(R)

## 2017-10-03 ENCOUNTER — Ambulatory Visit: Payer: Medicare Other

## 2017-10-03 ENCOUNTER — Encounter: Payer: Self-pay | Admitting: Family Medicine

## 2017-10-03 ENCOUNTER — Ambulatory Visit (INDEPENDENT_AMBULATORY_CARE_PROVIDER_SITE_OTHER): Payer: Medicare Other | Admitting: Family Medicine

## 2017-10-03 ENCOUNTER — Other Ambulatory Visit: Payer: Self-pay

## 2017-10-03 VITALS — BP 160/80 | HR 50 | Ht 71.0 in | Wt 192.0 lb

## 2017-10-03 DIAGNOSIS — I10 Essential (primary) hypertension: Secondary | ICD-10-CM | POA: Diagnosis not present

## 2017-10-03 DIAGNOSIS — Z723 Lack of physical exercise: Secondary | ICD-10-CM | POA: Diagnosis not present

## 2017-10-03 DIAGNOSIS — E785 Hyperlipidemia, unspecified: Secondary | ICD-10-CM | POA: Diagnosis not present

## 2017-10-03 DIAGNOSIS — E119 Type 2 diabetes mellitus without complications: Secondary | ICD-10-CM

## 2017-10-03 DIAGNOSIS — R0600 Dyspnea, unspecified: Secondary | ICD-10-CM

## 2017-10-03 DIAGNOSIS — R0602 Shortness of breath: Secondary | ICD-10-CM | POA: Diagnosis not present

## 2017-10-03 DIAGNOSIS — E1159 Type 2 diabetes mellitus with other circulatory complications: Secondary | ICD-10-CM | POA: Diagnosis not present

## 2017-10-03 DIAGNOSIS — I152 Hypertension secondary to endocrine disorders: Secondary | ICD-10-CM

## 2017-10-03 DIAGNOSIS — R0609 Other forms of dyspnea: Secondary | ICD-10-CM

## 2017-10-03 DIAGNOSIS — E1169 Type 2 diabetes mellitus with other specified complication: Secondary | ICD-10-CM

## 2017-10-03 LAB — POCT HEMOGLOBIN: Hemoglobin: 12 g/dL — AB (ref 14.1–18.1)

## 2017-10-03 NOTE — Progress Notes (Signed)
Pt here for an acute care OV today   Impression and Recommendations:    1. Exertional dyspnea   2. Hyperlipidemia associated with type 2 diabetes mellitus ([place])   3. Hypertension associated with diabetes ([place])   4. Diabetes mellitus without complication ([place])   5. Inactivity-recently due to knee pain   6. Shortness of breath     SOB -Ordered x-ray of chest; did not appreciate any fluid in chest cavity or enlarged heart. Directly visualized and independently interpreted x rays which further influenced my plan of care.  -Recommended patient for a CT  -Discussed red flag symptoms with patient and wife and instructed patient to go to ER if experiencing them  -Counseled patient to consult with the cardiology for clearance for his scheduled back surgery  -Without further workup and treatment planned, risk to pt's morbidity is high with possible prolonged functional impairment and poor health outcomes.    Stress -Encouraged patient to find ways to decrease his stress and anxiety such as counseling and therapy       No orders of the defined types were placed in this encounter.   There are no discontinued medications.   Orders Placed This Encounter  Procedures  . DG Chest 2 View  . CT ANGIO CHEST PE W OR WO CONTRAST  . Basic metabolic panel  . D-dimer, quantitative (not at [facility])  . Ambulatory referral to Cardiology  . POCT hemoglobin     Education and routine counseling performed. Handouts provided  Gross side effects, risk and benefits, and alternatives of medications and treatment plan in general discussed with patient.  Patient is aware that all medications have potential side effects and we are unable to predict every side effect or drug-drug interaction that may occur.   Patient will call with any questions prior to using medication if they have concerns.  Expresses verbal understanding and consents to current therapy and treatment regimen.  No barriers to  understanding were identified.  Red flag symptoms and signs discussed in detail.  Patient expressed understanding regarding what to do in case of emergency\urgent symptoms   Please see AVS handed out to patient at the end of our visit for further patient instructions/ counseling done pertaining to today's office visit.   Return if symptoms worsen or fail to improve.     Note:  This document was prepared occasionally using Dragon voice recognition software and may include unintentional dictation errors in addition to a scribe.   This document serves as a record of services personally performed by Mellody Dance, MD. It was created on her behalf by Georga Bora, a trained medical scribe. The creation of this record is based on the scribe's personal observations and the provider's statements to them.   I have reviewed the above medical documentation for accuracy and completeness and I concur.  Mellody Dance 11/20/17 4:50 PM   --------------------------------------------------------------------------------------------------------------------------------------------------------------------------------------------------------------------------------------------    Subjective:    CC:  Chief Complaint  Patient presents with  . Shortness of Breath    HPI: Kyle Simon is a 84 y.o. male who presents to [place] at [facility] today for issues as discussed below.   SOB -Started noticing SOB in 08/10/2022 after son's death, states it was "probably due to anxiety"   -Has noticed more SOB than usual also with exertion since 08-10-22 as well -Denies chest pain, heart palpitations, coughing, wheezing, pain with breathing -States on his way back from [place] through terminals was "at least  45 min and it was killing him"  -Wife says he can not walk "really at all" without SOB issues -Wife says his SOB started "during the first days of the trip, before [the son] even died" -Wife  states his energy level has been low "for a while" before the trip but the SOB only started at the beginning of the trip in may   Stress -Son died unexpectedly while he was on a trip in [place], and he had to return from vacation -States he is not sleeping well because his medication causes him to be "up and down to the bathroom" -Wife states he's "falling asleep any time he sits in his chair -Patient does not believe sleep issues are due to anxiety, but to frequent bathroom use -Says his mood has been "ok, not anything terrible"    No problems updated.   Wt Readings from Last 3 Encounters:  10/25/17 191 lb 12.8 oz (87 kg)  10/07/17 192 lb (87.1 kg)  10/05/17 192 lb 6.4 oz (87.3 kg)   BP Readings from Last 3 Encounters:  11/04/17 (!) 129/56  10/25/17 (!) 150/69  10/05/17 (!) 158/74   BMI Readings from Last 3 Encounters:  10/25/17 27.13 kg/m  10/07/17 26.78 kg/m  10/05/17 26.83 kg/m     Patient Care Team    Relationship Specialty Notifications Start End  Mellody Dance, DO PCP - General Family Medicine  08/05/15   Rana Snare, MD Consulting Physician Urology  11/10/15   Lavonna Monarch, MD Consulting Physician Dermatology  0/09/38   Jodi Marble, MD Consulting Physician Otolaryngology  11/10/15   Calton Dach, MD Referring Physician Optometry  11/10/15   Inocencio Homes, [place] Consulting Physician Podiatry  11/10/15   Bo Merino, MD Consulting Physician Rheumatology  11/10/15   Vickey Huger, MD Consulting Physician Orthopedic Surgery  11/10/15   Erline Levine, MD Consulting Physician Neurosurgery  11/10/15   Deboraha Sprang, MD Consulting Physician Cardiology  01/04/16    Comment: seen '12 last  Elayne Snare, MD Consulting Physician Endocrinology  04/14/17   Kathrynn Ducking, MD Consulting Physician Neurology  1/82/99   Jodi Marble, MD Consulting Physician Otolaryngology  04/22/17      Patient Active Problem List   Diagnosis Date Noted  . Stressful life event  affecting family-loss on May 31st 08/22/2017    Priority: High  . DM assoc with CKD (chronic kidney disease), stage III ([place]) 04/14/2017    Priority: High  . Diabetes mellitus without complication ([place]) 37/16/9678    Priority: High  . Hypertension associated with diabetes ([place]) 03/20/2014    Priority: High  . Hyperlipidemia associated with type 2 diabetes mellitus ([place]) 04/19/2007    Priority: High  . H/O total knee replacement, bilateral 11/22/2016    Priority: Medium  . High risk medications (not anticoagulants) long-term use 01/04/2016    Priority: Medium  . Hypomagnesemia 12/29/2015    Priority: Medium  . h/o Hyponatremia 12/29/2015    Priority: Medium  . BPH (benign prostatic hyperplasia) 04/18/2007    Priority: Medium  . Hypothyroidism 04/20/2006    Priority: Medium  . Age-related osteoporosis without current pathological fracture 07/16/2016    Priority: Low  . Flatulence symptom 07/25/2015    Priority: Low  . Diarrhea 06/21/2015    Priority: Low  . B12 deficiency anemia 01/22/2013    Priority: Low  . Anemia, unspecified 08/13/2012    Priority: Low  . GERD (gastroesophageal reflux disease) 03/26/2011    Priority: Low  .  SOB (shortness of breath) 10/05/2017  . Aortic atherosclerosis ([place]) 10/05/2017  . Fatigue 08/22/2017  . Chronic pain of right knee 08/22/2017  . Inactivity-recently due to knee pain 08/22/2017  . Lumbar radiculopathy 06/16/2017  . Total knee replacement status, right 04/14/2017  . Arthralgia of right temporomandibular joint 03/02/2017  . Chronic eczematous otitis externa of both ears 03/02/2017  . Presbycusis of both ears 03/02/2017  . ETD (Eustachian tube dysfunction), right 09/15/2016  . Strain of masseter muscle 09/15/2016  . Trochanteric bursitis of right hip 09/07/2016  . History of lumbar fusion 07/16/2016  . History of total knee arthroplasty, left 07/16/2016  . Cough 01/04/2016  . Cerumen impaction 08/25/2015  . Chronic rhinitis  11/01/2014  . Spondylolisthesis of lumbar region 10/23/2013  . Diverticulosis of colon without hemorrhage 01/22/2013  . Synovial cyst of lumbar spine 07/26/2011  . Spasms of the hands or feet 07/26/2011  . SENILE KERATOSIS 06/12/2008  . SNORING, HX OF 04/19/2007    Past Medical history, Surgical history, Family history, Social history, Allergies and Medications have been entered into the medical record, reviewed and changed as needed.    Current Meds  Medication Sig  . acetaminophen (TYLENOL) 500 MG tablet Take 1,000 mg by mouth every 6 (six) hours as needed for mild pain.   Marland Kitchen aspirin 81 MG tablet Take 81 mg by mouth every evening.   . Calcium Carb-Cholecalciferol (CALCIUM 600 + D PO) Take 1 tablet by mouth daily.   Marland Kitchen CALCIUM-MAGNESIUM-ZINC PO Take 1 tablet by mouth daily.  Marland Kitchen glucose blood (PRECISION XTRA TEST STRIPS) test strip USE TO CHECK BLOOD SUGAR ONCE DAILY DX E11.9  . Hypromellose (ARTIFICIAL TEARS OP) Place 1 drop into both eyes 2 (two) times daily.  Marland Kitchen LANCETS ULTRA THIN 30G MISC Patient is to use to check blood glucose in the AM fasting and 2 hours after largest meal.  . Multiple Vitamin (MULTIVITAMIN WITH MINERALS) TABS Take 1 tablet by mouth daily.  . NONFORMULARY OR COMPOUNDED ITEM Glucometer Museum/gallery curator XTRA- Model #[id number])  . Omega-3 Fatty Acids (FISH OIL) 1000 MG CAPS Take 1,000 mg by mouth every evening.  . Potassium 99 MG TABS Take 99 mg by mouth daily.  . Probiotic Product (RESTORA) CAPS One tab qd (Patient taking differently: Take 1 capsule by mouth every evening. )  . SYNTHROID 150 MCG tablet TAKE 1 TABLET DAILY BEFORE BREAKFAST  . Tamsulosin HCl (FLOMAX) 0.4 MG CAPS Take 0.4 mg by mouth 2 (two) times daily.   . vitamin B-12 (CYANOCOBALAMIN) 1000 MCG tablet Take 1,000 mcg by mouth daily.  . [DISCONTINUED] ibuprofen (ADVIL,MOTRIN) 600 MG tablet Take 600 mg by mouth every 6 (six) hours as needed for moderate pain.   . [DISCONTINUED] losartan (COZAAR) 50 MG tablet  Take 0.5 tablets (25 mg total) by mouth daily. (Patient taking differently: Take 50 mg by mouth daily. )    Allergies:  Allergies  Allergen Reactions  . Simvastatin Other (See Comments)    MYALGIAS  . Oxycodone Other (See Comments)    Severe constipation     Review of Systems: General:   Denies fever, chills, unexplained weight loss.  Optho/Auditory:   Denies visual changes, blurred vision/LOV Respiratory:   Denies wheeze, DOE more than baseline levels.   Cardiovascular:   Denies chest pain, palpitations, new onset peripheral edema  Gastrointestinal:   Denies nausea, vomiting, diarrhea, abd pain.  Genitourinary: Denies dysuria, freq/ urgency, flank pain or discharge from genitals.  Endocrine:     Denies  hot or cold intolerance, polyuria, polydipsia. Musculoskeletal:   Denies unexplained myalgias, joint swelling, unexplained arthralgias, gait problems.  Skin:  Denies new onset rash, suspicious lesions Neurological:     Denies dizziness, unexplained weakness, numbness  Psychiatric/Behavioral:   Denies mood changes, suicidal or homicidal ideations, hallucinations    Objective:   Blood pressure (!) 160/80, pulse (!) 50, height 5\' 11"  (1.803 m), weight 192 lb (87.1 kg), SpO2 96 %. Body mass index is 26.78 kg/m. General:  Well Developed, well nourished, appropriate for stated age.  Neuro:  Alert and oriented,  extra-ocular muscles intact  HEENT:  Normocephalic, atraumatic, neck supple Skin:  no gross rash, warm, pink. Cardiac:  RRR, S1 S2 Respiratory:  ECTA B/L and A/P, Not using accessory muscles, conversationally out of breath Vascular:  Ext warm, no cyanosis apprec.; cap RF less 2 sec. Psych:  No HI/SI, judgement and insight good, Euthymic mood. Full Affect.

## 2017-10-03 NOTE — Patient Instructions (Addendum)
   Because of this new onset of exertional shortness of breath, I recommend you will need a cardiologist to give you okay to have your upcoming surgery as well as evaluate these new symptoms for you.  I will not be able to clear you for the surgeries due to these new symptoms.

## 2017-10-04 ENCOUNTER — Encounter: Payer: Self-pay | Admitting: Rheumatology

## 2017-10-04 ENCOUNTER — Ambulatory Visit (INDEPENDENT_AMBULATORY_CARE_PROVIDER_SITE_OTHER): Payer: Medicare Other | Admitting: Rheumatology

## 2017-10-04 ENCOUNTER — Telehealth: Payer: Self-pay | Admitting: Family Medicine

## 2017-10-04 VITALS — BP 135/76 | HR 80 | Resp 18 | Ht 71.0 in | Wt 188.0 lb

## 2017-10-04 DIAGNOSIS — Z862 Personal history of diseases of the blood and blood-forming organs and certain disorders involving the immune mechanism: Secondary | ICD-10-CM

## 2017-10-04 DIAGNOSIS — M48061 Spinal stenosis, lumbar region without neurogenic claudication: Secondary | ICD-10-CM | POA: Diagnosis not present

## 2017-10-04 DIAGNOSIS — S90221A Contusion of right lesser toe(s) with damage to nail, initial encounter: Secondary | ICD-10-CM

## 2017-10-04 DIAGNOSIS — Z8719 Personal history of other diseases of the digestive system: Secondary | ICD-10-CM

## 2017-10-04 DIAGNOSIS — Z96653 Presence of artificial knee joint, bilateral: Secondary | ICD-10-CM

## 2017-10-04 DIAGNOSIS — Z8639 Personal history of other endocrine, nutritional and metabolic disease: Secondary | ICD-10-CM

## 2017-10-04 DIAGNOSIS — M4316 Spondylolisthesis, lumbar region: Secondary | ICD-10-CM | POA: Diagnosis not present

## 2017-10-04 DIAGNOSIS — Z8679 Personal history of other diseases of the circulatory system: Secondary | ICD-10-CM | POA: Diagnosis not present

## 2017-10-04 DIAGNOSIS — M810 Age-related osteoporosis without current pathological fracture: Secondary | ICD-10-CM | POA: Diagnosis not present

## 2017-10-04 LAB — BASIC METABOLIC PANEL
BUN/Creatinine Ratio: 19 (ref 10–24)
BUN: 18 mg/dL (ref 8–27)
CO2: 21 mmol/L (ref 20–29)
Calcium: 9.2 mg/dL (ref 8.6–10.2)
Chloride: 94 mmol/L — ABNORMAL LOW (ref 96–106)
Creatinine, Ser: 0.93 mg/dL (ref 0.76–1.27)
GFR calc Af Amer: 88 mL/min/{1.73_m2} (ref >59)
GFR calc non Af Amer: 76 mL/min/{1.73_m2} (ref >59)
Glucose: 115 mg/dL — ABNORMAL HIGH (ref 65–99)
Potassium: 5.1 mmol/L (ref 3.5–5.2)
Sodium: 130 mmol/L — ABNORMAL LOW (ref 134–144)

## 2017-10-04 LAB — D-DIMER, QUANTITATIVE: D-DIMER: 0.45 mg/L FEU (ref 0.00–0.49)

## 2017-10-04 NOTE — Telephone Encounter (Signed)
Please send it to my in box as "clinic orders that I need to cosign" so I can do so.   Sorry but I cannot cosign an order that I cannot see or find.

## 2017-10-04 NOTE — Telephone Encounter (Signed)
We had to change the order, they need you to cosign the order.  Thanks. MPulliam, CMA/RT(R)

## 2017-10-04 NOTE — Telephone Encounter (Signed)
Rcvd call from [place] rad tech to notifiy Dr. Raliegh Scarlet her [facility] ) is needed on order for Ct Angio for patient.  ---pt's appt is at [facility] on 10/05/17.  --forwarding message to medical assistant to inform provider. & call [facility] Radiology if questions [phone].   --glh

## 2017-10-04 NOTE — Progress Notes (Signed)
Cardiology Office Note   Date:  10/05/2017   ID:  Kyle Simon, DOB Dec 19, 1932, MRN [id number]  PCP:  Mellody Dance, DO  Cardiologist:   No primary care provider on file. Referring:  Mellody Dance, DO  Chief Complaint  Patient presents with  . Shortness of Breath      History of Present Illness: Kyle Simon is a 84 y.o. male who is referred by Mellody Dance, DO for evaluation of SOB and abnormal EKG.   He had a low risk stress echo in 2012.  He had a distant cardiac cath.  He is been having problems with joint pain and had a back injection yesterday.  He is going to have back surgery next month.  Prior to this he was active in his yard but he is been slowing down per his wife for a while and he reports increasing shortness of breath at least over a few weeks.  He is had decreased energy.  Recently while hurrying through the airport in [place] he was very short of breath.  He was hurrying to get home because his son had died.  He is referred because of the increasing dyspnea with activities and decreased exercise tolerance.  He has not been noted to have any chest discomfort, neck or arm discomfort.  He has not been having any weight gain or edema.  He has premature ventricular contractions on his EKG but he does not really feel these.  He has no presyncope or syncope.   Past Medical History:  Diagnosis Date  . Arthritis    BOTH KNEES and hands;Dr.Deveshwar  . Constipation due to pain medication   . Diabetes mellitus without complication ([place])    Type II. Uses no medication. Pt controls with diet and weight  . Enlarged prostate   . Frequent urination at night   . GERD (gastroesophageal reflux disease)   . Hyperlipidemia   . Hypertension   . Hypothyroidism   . Polio    as a child w/o complications  . Snoring    no sleep apnea  . Torn meniscus   . Transfusion history 2012   post TKR    Past Surgical History:  Procedure Laterality Date  . BACK SURGERY  2011   hemiarthroplasty 01/11/1999 and 6  . CARDIAC CATHETERIZATION  09/2001   negative  . EYE SURGERY  2003   R&L cataract removed & IOL- 2003 & 2007,Dr.Jenkins  . INGUINAL HERNIA REPAIR  2002   right, Dr. Truitt Leep  . JOINT REPLACEMENT  2012   left knee replacement  . LIPOMA EXCISION     back  . MENISCECTOMY Bilateral    Dr. Noemi Chapel  . PROSTATE SURGERY  07/2006   for urinary urgency, Dr. Risa Grill  . ROTATOR CUFF REPAIR Right October, 2016  . sinus & throat surgery-1995  1995  . TONSILLECTOMY    . TOTAL KNEE ARTHROPLASTY  01/11/2011   Procedure: TOTAL KNEE ARTHROPLASTY;  Surgeon: Rudean Haskell, MD;  Location: [place];  Service: Orthopedics;  Laterality: Left;  . TOTAL KNEE ARTHROPLASTY Right 11/22/2016   Procedure: TOTAL KNEE ARTHROPLASTY;  Surgeon: Vickey Huger, MD;  Location: [facility];  Service: Orthopedics;  Laterality: Right;  . UVULECTOMY  3151   Dr. Erik Obey  . VASECTOMY    . WRIST SURGERY  1959   fracture- right     Current Outpatient Medications  Medication Sig Dispense Refill  . acetaminophen (TYLENOL) 500 MG tablet Take 1,000 mg by mouth as  needed for mild pain.     Marland Kitchen aspirin 81 MG tablet Take 81 mg by mouth daily.    . Calcium Carb-Cholecalciferol (CALCIUM 600 + D PO) Take 1 tablet by mouth every evening.    Marland Kitchen CALCIUM-MAGNESIUM-ZINC PO Take 1 tablet by mouth daily.    Marland Kitchen glucose blood (PRECISION XTRA TEST STRIPS) test strip USE TO CHECK BLOOD SUGAR ONCE DAILY DX E11.9 100 each 2  . Hypromellose (ARTIFICIAL TEARS OP) Place 1 drop into both eyes 2 (two) times daily.    Marland Kitchen ibuprofen (ADVIL,MOTRIN) 600 MG tablet Take 600 mg by mouth every 4 (four) hours as needed.    Marland Kitchen LANCETS ULTRA THIN 30G MISC Patient is to use to check blood glucose in the AM fasting and 2 hours after largest meal. 100 each 6  . losartan (COZAAR) 50 MG tablet Take 0.5 tablets (25 mg total) by mouth daily. 45 tablet 1  . Multiple Vitamin (MULTIVITAMIN WITH MINERALS) TABS Take 1 tablet by mouth daily.    .  NONFORMULARY OR COMPOUNDED ITEM Glucometer (Precision XTRA- Model #[id number]) 1 each 0  . Omega-3 Fatty Acids (FISH OIL) 1000 MG CAPS Take 1,000 mg by mouth every evening.    . Potassium 99 MG TABS Take 99 mg by mouth daily.    . Probiotic Product (RESTORA) CAPS One tab qd (Patient taking differently: Take 1 capsule by mouth daily. One tab qd) 90 capsule 3  . SYNTHROID 150 MCG tablet TAKE 1 TABLET DAILY BEFORE BREAKFAST 90 tablet 0  . Tamsulosin HCl (FLOMAX) 0.4 MG CAPS Take 0.4 mg by mouth 2 (two) times daily.     . vitamin B-12 (CYANOCOBALAMIN) 1000 MCG tablet Take 1,000 mcg by mouth daily.     No current facility-administered medications for this visit.    Facility-Administered Medications Ordered in Other Visits  Medication Dose Route Frequency Provider Last Rate Last Dose  . iopamidol (ISOVUE-370) 76 % injection             Allergies:   Simvastatin and Oxycodone    Social History:  The patient  reports that he quit smoking about 51 years ago. He has a 18.00 pack-year smoking history. He has never used smokeless tobacco. He reports that he drinks about 2.4 oz of alcohol per week. He reports that he does not use drugs.   Family History:  The patient's family history includes Asthma in his brother; Bone cancer in his brother; Coronary artery disease in his mother; Diabetes in his paternal grandmother; Diverticulosis in his mother; Emphysema in his mother; Sarcoidosis in his son; Sudden death in his father; Thyroid disease in his daughter and mother.    ROS:  Please see the history of present illness.   Otherwise, review of systems are positive for back pain.   All other systems are reviewed and negative.    PHYSICAL EXAM: VS:  BP (!) 158/74   Pulse 68   Ht 5\' 11"  (1.803 m)   Wt 192 lb 6.4 oz (87.3 kg)   BMI 26.83 kg/m  , BMI Body mass index is 26.83 kg/m. GENERAL:  Well appearing HEENT:  Pupils equal round and reactive, fundi not visualized, oral mucosa unremarkable NECK:  No  jugular venous distention, waveform within normal limits, carotid upstroke brisk and symmetric, no bruits, no thyromegaly LYMPHATICS:  No cervical, inguinal adenopathy LUNGS:  Clear to auscultation bilaterally BACK:  No CVA tenderness CHEST:  Unremarkable HEART:  PMI not displaced or sustained,S1 and S2 within normal  limits, no S3, no S4, no clicks, no rubs, soft apical systolic murmur, no diastolic  murmurs ABD:  Flat, positive bowel sounds normal in frequency in pitch, no bruits, no rebound, no guarding, no midline pulsatile mass, no hepatomegaly, no splenomegaly EXT:  2 plus pulses throughout, no edema, no cyanosis no clubbing SKIN:  No rashes no nodules NEURO:  Cranial nerves II through XII grossly intact, motor grossly intact throughout PSYCH:  Cognitively intact, oriented to person place and time    EKG:  EKG is ordered today. The ekg ordered today demonstrates sinus rhythm, rate 68, axis within normal limits, intervals within normal limits, premature ventricular contractions.   Recent Labs: 08/22/2017: ALT 19; Platelets 250; TSH 6.950 10/03/2017: BUN 18; Creatinine, Ser 0.93; Hemoglobin 12.0; Potassium 5.1; Sodium 130    Lipid Panel    Component Value Date/Time   CHOL 167 09/15/2016 0812   TRIG 130 09/15/2016 0812   HDL 43 09/15/2016 0812   CHOLHDL 3.9 09/15/2016 0812   CHOLHDL 2.6 07/29/2015 0826   VLDL 20 07/29/2015 0826   LDLCALC 98 09/15/2016 0812   LDLDIRECT 61.6 04/25/2013 1431      Wt Readings from Last 3 Encounters:  10/05/17 192 lb 6.4 oz (87.3 kg)  10/04/17 188 lb (85.3 kg)  10/03/17 192 lb (87.1 kg)      Other studies Reviewed: Additional studies/ records that were reviewed today include:  Labs. Review of the above records demonstrates:  Please see elsewhere in the note.     ASSESSMENT AND PLAN:  SOB:  Given the risk factors, coronary calcium and symptoms he needs stress testing but could not have POET (Plain Old Exercise Treadmill) secondary to joint  problems.  He will have a [employer].   HTN:  He just had his Cozaar increased.  He will keep a BP diary and present this to Mellody Dance, DO  ABNORMAL EKG:  This will be evaluated as above.      CORONARY CALCIUM:    He will have [place] and continued risk reduction.  AORTIC ATHEROSCLEROSIS:   Discussed primary risk reduction as above.   PREOP:  Will be based on the results of the stress test.   DYSLIPIDEMIA:  I have suggested restarting low dose Crestor which he took in the past.    Current medicines are reviewed at length with the patient today.  The patient does not have concerns regarding medicines.  The following changes have been made:  no change  Labs/ tests ordered today include:   Orders Placed This Encounter  Procedures  . MYOCARDIAL PERFUSION IMAGING  . EKG 12-Lead     Disposition:   FU with me as needed    Signed, Minus Breeding, MD  10/05/2017 4:57 PM    [facility]

## 2017-10-05 ENCOUNTER — Ambulatory Visit (HOSPITAL_COMMUNITY)
Admission: RE | Admit: 2017-10-05 | Discharge: 2017-10-05 | Disposition: A | Discharge status: Home / Self Care | Payer: Medicare Other | Source: Ambulatory Visit | Attending: Family Medicine | Admitting: Family Medicine

## 2017-10-05 ENCOUNTER — Ambulatory Visit: Payer: Medicare Other | Admitting: Cardiology

## 2017-10-05 ENCOUNTER — Encounter: Payer: Self-pay | Admitting: Cardiology

## 2017-10-05 ENCOUNTER — Ambulatory Visit (INDEPENDENT_AMBULATORY_CARE_PROVIDER_SITE_OTHER): Payer: Medicare Other | Admitting: Cardiology

## 2017-10-05 VITALS — BP 158/74 | HR 68 | Ht 71.0 in | Wt 192.4 lb

## 2017-10-05 DIAGNOSIS — R0602 Shortness of breath: Secondary | ICD-10-CM | POA: Insufficient documentation

## 2017-10-05 DIAGNOSIS — I2510 Atherosclerotic heart disease of native coronary artery without angina pectoris: Secondary | ICD-10-CM | POA: Insufficient documentation

## 2017-10-05 DIAGNOSIS — R0609 Other forms of dyspnea: Secondary | ICD-10-CM | POA: Diagnosis not present

## 2017-10-05 DIAGNOSIS — R0600 Dyspnea, unspecified: Secondary | ICD-10-CM

## 2017-10-05 DIAGNOSIS — I700 Atherosclerosis of aorta: Secondary | ICD-10-CM | POA: Diagnosis not present

## 2017-10-05 DIAGNOSIS — I10 Essential (primary) hypertension: Secondary | ICD-10-CM | POA: Diagnosis not present

## 2017-10-05 DIAGNOSIS — R918 Other nonspecific abnormal finding of lung field: Secondary | ICD-10-CM | POA: Insufficient documentation

## 2017-10-05 DIAGNOSIS — E785 Hyperlipidemia, unspecified: Secondary | ICD-10-CM | POA: Diagnosis not present

## 2017-10-05 MED ORDER — IOPAMIDOL (ISOVUE-370) INJECTION 76%
INTRAVENOUS | Status: AC
  Filled 2017-10-05: qty 100

## 2017-10-05 MED ORDER — IOPAMIDOL (ISOVUE-370) INJECTION 76%
100.0000 mL | Freq: Once | INTRAVENOUS | Status: AC | PRN
  Administered 2017-10-05: 100 mL via INTRAVENOUS

## 2017-10-05 NOTE — Patient Instructions (Signed)
Medication Instructions:  Your physician recommends that you continue on your current medications as directed. Please refer to the Current Medication list given to you today.   Labwork: none  Testing/Procedures: Your physician has requested that you have a lexiscan myoview. For further information please visit [email or web address]. Please follow instruction sheet, as given.    Follow-Up: Follow up with Dr. Percival Spanish as needed.   Any Other Special Instructions Will Be Listed Below (If Applicable).     If you need a refill on your cardiac medications before your next appointment, please call your pharmacy.

## 2017-10-06 ENCOUNTER — Telehealth (HOSPITAL_COMMUNITY): Payer: Self-pay

## 2017-10-06 DIAGNOSIS — S91109A Unspecified open wound of unspecified toe(s) without damage to nail, initial encounter: Secondary | ICD-10-CM | POA: Diagnosis not present

## 2017-10-06 DIAGNOSIS — L600 Ingrowing nail: Secondary | ICD-10-CM | POA: Diagnosis not present

## 2017-10-06 DIAGNOSIS — M25571 Pain in right ankle and joints of right foot: Secondary | ICD-10-CM | POA: Diagnosis not present

## 2017-10-06 NOTE — Telephone Encounter (Signed)
Encounter complete. 

## 2017-10-07 ENCOUNTER — Ambulatory Visit (HOSPITAL_COMMUNITY)
Admission: RE | Admit: 2017-10-07 | Discharge: 2017-10-07 | Disposition: A | Discharge status: Home / Self Care | Payer: Medicare Other | Source: Ambulatory Visit | Attending: Cardiology | Admitting: Cardiology

## 2017-10-07 DIAGNOSIS — R0602 Shortness of breath: Secondary | ICD-10-CM | POA: Diagnosis not present

## 2017-10-07 LAB — MYOCARDIAL PERFUSION IMAGING
LV dias vol: 130 mL (ref 62–150)
LV sys vol: 63 mL
Peak HR: 69 {beats}/min
Rest HR: 46 {beats}/min
SDS: 0
SRS: 4
SSS: 4
TID: 1.02

## 2017-10-07 MED ORDER — REGADENOSON 0.4 MG/5ML IV SOLN
0.4000 mg | Freq: Once | INTRAVENOUS | Status: AC
  Administered 2017-10-07: 0.4 mg via INTRAVENOUS

## 2017-10-07 MED ORDER — TECHNETIUM TC 99M TETROFOSMIN IV KIT
32.5000 | PACK | Freq: Once | INTRAVENOUS | Status: AC | PRN
  Administered 2017-10-07: 32.5 via INTRAVENOUS
  Filled 2017-10-07: qty 33

## 2017-10-07 MED ORDER — TECHNETIUM TC 99M TETROFOSMIN IV KIT
10.3000 | PACK | Freq: Once | INTRAVENOUS | Status: AC | PRN
  Administered 2017-10-07: 10.3 via INTRAVENOUS
  Filled 2017-10-07: qty 11

## 2017-10-24 NOTE — Pre-Procedure Instructions (Signed)
Kyle Simon  10/24/2017      [facility] [address] [place], [place] [address] Phone: [phone] Fax: [phone]    Your procedure is scheduled on tHURSDAY sEPTEMBER 5.  Report to [facility] Admitting at 5:30 A.M.  Call this number if you have problems the morning of surgery:  [phone]   Remember:  Do not eat or drink after midnight.    Take these medicines the morning of surgery with A SIP OF WATER:   Synthroid Tamsulosin (Flomax) Acetaminophen (tylenol) if needed  7 days prior to surgery STOP taking any  Aleve, Naproxen, Ibuprofen, Motrin, Advil, Goody's, BC's, all herbal medications, fish oil, and all vitamins  FOLLOW your surgeon's instructions on stopping Aspirin.       How to Manage Your Diabetes Before and After Surgery  Why is it important to control my blood sugar before and after surgery? . Improving blood sugar levels before and after surgery helps healing and can limit problems. . A way of improving blood sugar control is eating a healthy diet by: o  Eating less sugar and carbohydrates o  Increasing activity/exercise o  Talking with your doctor about reaching your blood sugar goals . High blood sugars (greater than 180 mg/dL) can raise your risk of infections and slow your recovery, so you will need to focus on controlling your diabetes during the weeks before surgery. . Make sure that the doctor who takes care of your diabetes knows about your planned surgery including the date and location.  How do I manage my blood sugar before surgery? . Check your blood sugar at least 4 times a day, starting 2 days before surgery, to make sure that the level is not too high or low. o Check your blood sugar the morning of your surgery when you wake up and every 2 hours until you get to the Short Stay unit. . If your blood sugar is less than 70 mg/dL, you will need to treat for  low blood sugar: o Do not take insulin. o Treat a low blood sugar (less than 70 mg/dL) with  cup of clear juice (cranberry or apple), 4 glucose tablets, OR glucose gel. Recheck blood sugar in 15 minutes after treatment (to make sure it is greater than 70 mg/dL). If your blood sugar is not greater than 70 mg/dL on recheck, call [phone] o  for further instructions. . Report your blood sugar to the short stay nurse when you get to Short Stay.  . If you are admitted to the hospital after surgery: o Your blood sugar will be checked by the staff and you will probably be given insulin after surgery (instead of oral diabetes medicines) to make sure you have good blood sugar levels. o The goal for blood sugar control after surgery is 80-180 mg/dL.             Do not wear jewelry, make-up or nail polish.  Do not wear lotions, powders, or perfumes, or deodorant.  Do not shave 48 hours prior to surgery.  Men may shave face and neck.  Do not bring valuables to the hospital.  [facility] is not responsible for any belongings or valuables.  Contacts, dentures or bridgework may not be worn into surgery.  Leave your suitcase in the car.  After surgery it may be brought to your room.  For patients admitted to the hospital, discharge time will  be determined by your treatment team.  Patients discharged the day of surgery will not be allowed to drive home.   Special instructions:    - Preparing For Surgery  Before surgery, you can play an important role. Because skin is not sterile, your skin needs to be as free of germs as possible. You can reduce the number of germs on your skin by washing with CHG (chlorahexidine gluconate) Soap before surgery.  CHG is an antiseptic cleaner which kills germs and bonds with the skin to continue killing germs even after washing.    Oral Hygiene is also important to reduce your risk of infection.  Remember - BRUSH YOUR TEETH THE MORNING OF SURGERY  WITH YOUR REGULAR TOOTHPASTE  Please do not use if you have an allergy to CHG or antibacterial soaps. If your skin becomes reddened/irritated stop using the CHG.  Do not shave (including legs and underarms) for at least 48 hours prior to first CHG shower. It is OK to shave your face.  Please follow these instructions carefully.   1. Shower the NIGHT BEFORE SURGERY and the MORNING OF SURGERY with CHG.   2. If you chose to wash your hair, wash your hair first as usual with your normal shampoo.  3. After you shampoo, rinse your hair and body thoroughly to remove the shampoo.  4. Use CHG as you would any other liquid soap. You can apply CHG directly to the skin and wash gently with a scrungie or a clean washcloth.   5. Apply the CHG Soap to your body ONLY FROM THE NECK DOWN.  Do not use on open wounds or open sores. Avoid contact with your eyes, ears, mouth and genitals (private parts). Wash Face and genitals (private parts)  with your normal soap.  6. Wash thoroughly, paying special attention to the area where your surgery will be performed.  7. Thoroughly rinse your body with warm water from the neck down.  8. DO NOT shower/wash with your normal soap after using and rinsing off the CHG Soap.  9. Pat yourself dry with a CLEAN TOWEL.  10. Wear CLEAN PAJAMAS to bed the night before surgery, wear comfortable clothes the morning of surgery  11. Place CLEAN SHEETS on your bed the night of your first shower and DO NOT SLEEP WITH PETS.    Day of Surgery:  Do not apply any deodorants/lotions.  Please wear clean clothes to the hospital/surgery center.   Remember to brush your teeth WITH YOUR REGULAR TOOTHPASTE.    Please read over the following fact sheets that you were given. Coughing and Deep Breathing, MRSA Information and Surgical Site Infection Prevention

## 2017-10-25 ENCOUNTER — Encounter (HOSPITAL_COMMUNITY)
Admission: RE | Admit: 2017-10-25 | Discharge: 2017-10-25 | Disposition: A | Discharge status: Home / Self Care | Payer: Medicare Other | Source: Ambulatory Visit | Attending: Neurosurgery | Admitting: Neurosurgery

## 2017-10-25 ENCOUNTER — Encounter (HOSPITAL_COMMUNITY): Payer: Self-pay

## 2017-10-25 ENCOUNTER — Other Ambulatory Visit: Payer: Self-pay

## 2017-10-25 DIAGNOSIS — Z79899 Other long term (current) drug therapy: Secondary | ICD-10-CM | POA: Diagnosis not present

## 2017-10-25 DIAGNOSIS — R0683 Snoring: Secondary | ICD-10-CM | POA: Insufficient documentation

## 2017-10-25 DIAGNOSIS — M48062 Spinal stenosis, lumbar region with neurogenic claudication: Secondary | ICD-10-CM | POA: Insufficient documentation

## 2017-10-25 DIAGNOSIS — Z7982 Long term (current) use of aspirin: Secondary | ICD-10-CM | POA: Insufficient documentation

## 2017-10-25 DIAGNOSIS — Z01812 Encounter for preprocedural laboratory examination: Secondary | ICD-10-CM | POA: Diagnosis not present

## 2017-10-25 DIAGNOSIS — E119 Type 2 diabetes mellitus without complications: Secondary | ICD-10-CM | POA: Insufficient documentation

## 2017-10-25 DIAGNOSIS — I10 Essential (primary) hypertension: Secondary | ICD-10-CM | POA: Diagnosis not present

## 2017-10-25 DIAGNOSIS — E785 Hyperlipidemia, unspecified: Secondary | ICD-10-CM | POA: Insufficient documentation

## 2017-10-25 DIAGNOSIS — Z96653 Presence of artificial knee joint, bilateral: Secondary | ICD-10-CM | POA: Insufficient documentation

## 2017-10-25 DIAGNOSIS — Z01818 Encounter for other preprocedural examination: Secondary | ICD-10-CM | POA: Insufficient documentation

## 2017-10-25 DIAGNOSIS — E039 Hypothyroidism, unspecified: Secondary | ICD-10-CM | POA: Diagnosis not present

## 2017-10-25 DIAGNOSIS — Z0183 Encounter for blood typing: Secondary | ICD-10-CM | POA: Diagnosis not present

## 2017-10-25 DIAGNOSIS — R350 Frequency of micturition: Secondary | ICD-10-CM | POA: Diagnosis not present

## 2017-10-25 DIAGNOSIS — Z8612 Personal history of poliomyelitis: Secondary | ICD-10-CM | POA: Diagnosis not present

## 2017-10-25 DIAGNOSIS — N401 Enlarged prostate with lower urinary tract symptoms: Secondary | ICD-10-CM | POA: Insufficient documentation

## 2017-10-25 DIAGNOSIS — K219 Gastro-esophageal reflux disease without esophagitis: Secondary | ICD-10-CM | POA: Insufficient documentation

## 2017-10-25 DIAGNOSIS — Z87891 Personal history of nicotine dependence: Secondary | ICD-10-CM | POA: Diagnosis not present

## 2017-10-25 DIAGNOSIS — Z79890 Hormone replacement therapy (postmenopausal): Secondary | ICD-10-CM | POA: Diagnosis not present

## 2017-10-25 LAB — BASIC METABOLIC PANEL
Anion gap: 9 (ref 5–15)
BUN: 12 mg/dL (ref 8–23)
CO2: 24 mmol/L (ref 22–32)
Calcium: 9.3 mg/dL (ref 8.9–10.3)
Chloride: 96 mmol/L — ABNORMAL LOW (ref 98–111)
Creatinine, Ser: 0.95 mg/dL (ref 0.61–1.24)
GFR calc Af Amer: >60 mL/min (ref >60)
GFR calc non Af Amer: >60 mL/min (ref >60)
Glucose, Bld: 131 mg/dL — ABNORMAL HIGH (ref 70–99)
Potassium: 5.3 mmol/L — ABNORMAL HIGH (ref 3.5–5.1)
Sodium: 129 mmol/L — ABNORMAL LOW (ref 135–145)

## 2017-10-25 LAB — HEMOGLOBIN A1C
Hgb A1c MFr Bld: 6.2 % — ABNORMAL HIGH (ref 4.8–5.6)
Mean Plasma Glucose: 131.24 mg/dL

## 2017-10-25 LAB — CBC
HCT: 37.1 % — ABNORMAL LOW (ref 39.0–52.0)
Hemoglobin: 12.2 g/dL — ABNORMAL LOW (ref 13.0–17.0)
MCH: 31.1 pg (ref 26.0–34.0)
MCHC: 32.9 g/dL (ref 30.0–36.0)
MCV: 94.6 fL (ref 78.0–100.0)
Platelets: 224 10*3/uL (ref 150–400)
RBC: 3.92 MIL/uL — ABNORMAL LOW (ref 4.22–5.81)
RDW: 12.3 % (ref 11.5–15.5)
WBC: 5.3 10*3/uL (ref 4.0–10.5)

## 2017-10-25 LAB — SURGICAL PCR SCREEN
MRSA, PCR: NEGATIVE
Staphylococcus aureus: NEGATIVE

## 2017-10-25 LAB — TYPE AND SCREEN
ABO/RH(D): O POS
Antibody Screen: NEGATIVE

## 2017-10-25 LAB — GLUCOSE, CAPILLARY: Glucose-Capillary: 121 mg/dL — ABNORMAL HIGH (ref 70–99)

## 2017-10-25 NOTE — Progress Notes (Addendum)
PCP -  Mellody Dance MD Cardiologist -  Minus Breeding MD  Chest x-ray - 10/03/17 EKG - 10/06/17 Stress Test - 10/07/17 ECHO - 08/20/10 Cardiac Cath -  2003  Fasting Blood Sugar -  95-105 Checks Blood Sugar __1___ times a week  Aspirin Instructions: pt stopped taking on on 10/24/17  Anesthesia review: yes. Labwork. NA of 129  Patient denies shortness of breath, fever, cough and chest pain at PAT appointment   Patient verbalized understanding of instructions that were given to them at the PAT appointment. Patient was also instructed that they will need to review over the PAT instructions again at home before surgery.

## 2017-10-26 NOTE — Progress Notes (Signed)
Anesthesia Chart Review:  Case:  [id number] Date/Time:  11/03/17 0715   Procedure:  Right Lumbar 3-4 Anterolateral lumbar interbody fusion with lateral plate (Right ) - Right Lumbar 3-4 Anterolateral lumbar interbody fusion with lateral plate   Anesthesia type:  General   Pre-op diagnosis:  Lumbar stenosis with neurogenic claudication   Location:  [facility] OR ROOM 21 / [place] [facility]   Surgeon:  Erline Levine, MD      DISCUSSION: Patient is a 84 year old male scheduled for the above procedure. History includes former smoker (quit '68), childhood polio (w/o complications), HTN, DM2, hyperlipidemia, hypothyroidism, GERD, snoring (no known sleep apnea).   Patient was recently evaluated by cardiologist Dr. Percival Spanish for SOB and abnormal EKG. He was aware of upcoming surgery plans. Myocardial perfusion imaging ordered which was low risk. CTA showed no PE, but did not coronary calcifications. PRN cardiology follow-up recommended according to 10/05/17 office note. Dr. Percival Spanish felt patient "would be at acceptable risk for the planned surgery." Patient reports SOB has improved since then.  Preoperative labs showed Na 129 (previously 129-132 from 11/16/16-10/03/17), Cl 96 (previously 94-100 since 11/16/16), K 5.3 (no mention of hemolysis; K4.4-5.5 since 11/16/16). His PCP Dr. Raliegh Scarlet has been following. Of note patient is on a daily potassium supplement that he has been taking for years. I advised him to discuss with his PCP regarding potentially a less frequent regimen. Will get an ISTAT4 on the day of surgery. If results are stable or improved then I anticipate that he can proceed as planned.   ASA on hold since 10/24/17. Patient inquired about having to hold his ibuprofen for 7 days prior to surgery. He does not think his pain will be adequately controlled off ibuprofen. He said that with previous back surgery and joint surgeries he does not recall ever having to hold his ibuprofen. I have left a voice message with Janett Billow at Dr.  Melven Sartorius office to contact patient to discuss Dr. Melven Sartorius recommendations for holding NSAIDS prior to surgery. He knows he can continue Tylenol as directed for pain.     VS: BP (!) 150/69   Pulse (!) 51   Temp (!) 36.4 C (Oral)   Resp 18   Ht 5' 10.5" (1.791 m)   Wt 87 kg   SpO2 100%   BMI 27.13 kg/m   PROVIDERS: Mellody Dance, DO is PCP Minus Breeding, MD is cardiologist. Last visit 10/05/17.   LABS: Preoperative labs noted. Lab suggest chronically mild hyponatremia. He has also had mild hyperkalemia in the past, but currently is taking a potassium supplelment. Will order and ISTAT4 for the day of surgery to evaluate for stability in results. Voice message left with Janett Billow at Dr. Melven Sartorius office regarding Na, K results and plans for ISTAT4 day of surgery.  (all labs ordered are listed, but only abnormal results are displayed)  Labs Reviewed  HEMOGLOBIN A1C - Abnormal; Notable for the following components:      Result Value   Hgb A1c MFr Bld 6.2 (*)    All other components within normal limits  BASIC METABOLIC PANEL - Abnormal; Notable for the following components:   Sodium 129 (*)    Potassium 5.3 (*)    Chloride 96 (*)    Glucose, Bld 131 (*)    All other components within normal limits  CBC - Abnormal; Notable for the following components:   RBC 3.92 (*)    Hemoglobin 12.2 (*)    HCT 37.1 (*)    All  other components within normal limits  GLUCOSE, CAPILLARY - Abnormal; Notable for the following components:   Glucose-Capillary 121 (*)    All other components within normal limits  SURGICAL PCR SCREEN  TYPE AND SCREEN    IMAGES: CTA chest 10/05/17: IMPRESSION: - No pulmonary emboli. - Question mild atelectasis in the left lower lobe. Possible mild lower lobe bronchial thickening. No dense consolidation or lobar collapse. - Aortic atherosclerosis.  Coronary artery calcification.  CXR 10/03/17: IMPRESSION: No active cardiopulmonary disease.   EKG: 10/05/17: SR with  occasional PVCs. Non-specific ST abnormality.    CV: Nuclear stress test 10/07/17:  The left ventricular ejection fraction is mildly decreased (45-54%).  Nuclear stress EF: 51%.  There was no ST segment deviation noted during stress.  The study is normal.  This is a low risk study.  Carotid U/S 05/16/17: Final Interpretation: - Right Carotid: The extracranial vessels were near-normal with only minimal wall thickening or plaque. - Left Carotid: The extracranial vessels were near-normal with only minimal wall thickening or plaque. - Vertebrals: Bilateral vertebral arteries demonstrate antegrade flow. - Subclavians: Normal flow hemodynamics were seen in bilateral subclavian arteries.  Normal stress echo on 08/20/10.   Cardiac cath 11/10/01 (false positive stress test): LM: Normal. LAD: 20% multiple discrete lesion in the mid and distal vessel. LCX: Codominant and large. 20-30% multiple discreet lesions in the mid and distal portion. PDA 20% multiple discrete lesions. RCA: Medium sized vessel. Supplied by the PDA. 20-30% multiple discrete lesions in the distal PDA and RCA.  LV: EF 60%. No gradient across the AV. No MR.    Past Medical History:  Diagnosis Date  . Arthritis    both hands;Dr.Deveshwar  . Constipation due to pain medication   . Diabetes mellitus without complication ([place])    Type II. Uses no medication. Pt controls with diet and weight  . Enlarged prostate   . Frequent urination at night   . GERD (gastroesophageal reflux disease)   . Hyperlipidemia   . Hypertension   . Hypothyroidism   . Polio    as a child w/o complications  . Snoring    no sleep apnea  . Torn meniscus   . Transfusion history 2012   post TKR    Past Surgical History:  Procedure Laterality Date  . BACK SURGERY  2011   hemiarthroplasty 01/11/1999 and 6  . CARDIAC CATHETERIZATION  09/2001   negative  . COLONOSCOPY    . EYE SURGERY  2003   R&L cataract removed & IOL- 2003 &  2007,Dr.Jenkins  . INGUINAL HERNIA REPAIR  2002   right, Dr. Truitt Leep  . JOINT REPLACEMENT  2012   left knee replacement  . LIPOMA EXCISION     back  . MENISCECTOMY Bilateral    Dr. Noemi Chapel  . PROSTATE SURGERY  07/2006   for urinary urgency, Dr. Risa Grill  . ROTATOR CUFF REPAIR Right October, 2016  . sinus & throat surgery-1995  1995  . TONSILLECTOMY    . TOTAL KNEE ARTHROPLASTY  01/11/2011   Procedure: TOTAL KNEE ARTHROPLASTY;  Surgeon: Rudean Haskell, MD;  Location: [place];  Service: Orthopedics;  Laterality: Left;  . TOTAL KNEE ARTHROPLASTY Right 11/22/2016   Procedure: TOTAL KNEE ARTHROPLASTY;  Surgeon: Vickey Huger, MD;  Location: [place];  Service: Orthopedics;  Laterality: Right;  . UVULECTOMY  3419   Dr. Erik Obey  . VASECTOMY    . WRIST SURGERY  1959   fracture- right    MEDICATIONS: . acetaminophen (TYLENOL)  500 MG tablet  . aspirin 81 MG tablet  . Calcium Carb-Cholecalciferol (CALCIUM 600 + D PO)  . CALCIUM-MAGNESIUM-ZINC PO  . glucose blood (PRECISION XTRA TEST STRIPS) test strip  . Hypromellose (ARTIFICIAL TEARS OP)  . ibuprofen (ADVIL,MOTRIN) 600 MG tablet  . LANCETS ULTRA THIN 30G MISC  . losartan (COZAAR) 50 MG tablet  . Multiple Vitamin (MULTIVITAMIN WITH MINERALS) TABS  . NONFORMULARY OR COMPOUNDED ITEM  . Omega-3 Fatty Acids (FISH OIL) 1000 MG CAPS  . Potassium 99 MG TABS  . Probiotic Product (RESTORA) CAPS  . SYNTHROID 150 MCG tablet  . Tamsulosin HCl (FLOMAX) 0.4 MG CAPS  . vitamin B-12 (CYANOCOBALAMIN) 1000 MCG tablet   No current facility-administered medications for this encounter.     George Hugh [facility] Short Stay Center/Anesthesiology Phone [phone] 10/26/2017 12:58 PM

## 2017-10-27 ENCOUNTER — Other Ambulatory Visit: Payer: Self-pay

## 2017-10-27 ENCOUNTER — Telehealth: Payer: Self-pay | Admitting: Family Medicine

## 2017-10-27 DIAGNOSIS — I152 Hypertension secondary to endocrine disorders: Secondary | ICD-10-CM

## 2017-10-27 DIAGNOSIS — I10 Essential (primary) hypertension: Principal | ICD-10-CM

## 2017-10-27 DIAGNOSIS — E1159 Type 2 diabetes mellitus with other circulatory complications: Secondary | ICD-10-CM

## 2017-10-27 NOTE — Telephone Encounter (Signed)
Please see note in chart, waiting on cardiology response. MPulliam, CMA/RT(R)

## 2017-10-27 NOTE — Telephone Encounter (Signed)
Refill request for losartan. Per Dr. Raliegh Scarlet note at office visit on 08/22/2017 patient was to increase losartan from 50 mg 1/2 tablet every other day to 50 mg 1/2 tablet every day. Per this dose patient should not be running low on medication and has a RF.  Called patient he states that Cardiology changed dose on 10/05/2017 to 50 mg 1 tablet daily.  Reviewed Caridology notes and did not see a change made to medications.  Please review and advise if changes need to be made for this patient's losartan.   MPulliam, CMA/RT(R)

## 2017-10-27 NOTE — Telephone Encounter (Signed)
Patient called to request Rx refill on :  losartan (COZAAR) 50 MG tablet [[id number]]   Order Details  Dose: 25 mg Route: Oral Frequency: Daily  Dispense Quantity: 45 tablet Refills: 1 Fills remaining: --        Sig: Take 0.5 tablets (25 mg total) by mouth daily.  Patient taking differently: Take 50 mg by mouth daily.        Written Date: 09/28/17 Expiration Date: 09/28/18       ---- Please send Rx refill to:  Preferred Pharmacies      [facility] - [place], [place] - [address] [phone] (Phone) [phone] (Fax)   --Forwarding message to medical assistant.--glh

## 2017-10-31 NOTE — Telephone Encounter (Signed)
I believe this change was made by Mellody Dance, DO

## 2017-11-01 NOTE — Telephone Encounter (Signed)
Called patient left message for patient to call the office. MPulliam, CMA/RT(R)  

## 2017-11-02 NOTE — Telephone Encounter (Signed)
Spoke to patient and notified him that no change was made.  Patient states that taking a whole tablet of the losartan daily has been working well for him and he would like to continue on that dose.  Patient states BP has been running 135-140/75-80 with the whole tablet daily.  Patient states that he has enough medication until Dr. Raliegh Scarlet is in the office on Monday.  Please review and advise. MPulliam, CMA/RT(R)

## 2017-11-03 ENCOUNTER — Inpatient Hospital Stay (HOSPITAL_COMMUNITY): Payer: Medicare Other | Admitting: Vascular Surgery

## 2017-11-03 ENCOUNTER — Encounter (HOSPITAL_COMMUNITY)
Admission: RE | Disposition: A | Discharge status: Home / Self Care | Payer: Self-pay | Source: Home / Self Care | Attending: Neurosurgery

## 2017-11-03 ENCOUNTER — Inpatient Hospital Stay (HOSPITAL_COMMUNITY)
Admission: RE | Admit: 2017-11-03 | Discharge: 2017-11-04 | DRG: 460 | Disposition: A | Discharge status: Home / Self Care | Payer: Medicare Other | Attending: Neurosurgery | Admitting: Neurosurgery

## 2017-11-03 ENCOUNTER — Encounter (HOSPITAL_COMMUNITY): Payer: Self-pay | Admitting: Certified Registered Nurse Anesthetist

## 2017-11-03 ENCOUNTER — Inpatient Hospital Stay (HOSPITAL_COMMUNITY): Payer: Medicare Other

## 2017-11-03 DIAGNOSIS — K219 Gastro-esophageal reflux disease without esophagitis: Secondary | ICD-10-CM | POA: Diagnosis present

## 2017-11-03 DIAGNOSIS — Z791 Long term (current) use of non-steroidal anti-inflammatories (NSAID): Secondary | ICD-10-CM | POA: Diagnosis not present

## 2017-11-03 DIAGNOSIS — Z713 Dietary counseling and surveillance: Secondary | ICD-10-CM

## 2017-11-03 DIAGNOSIS — E785 Hyperlipidemia, unspecified: Secondary | ICD-10-CM | POA: Diagnosis present

## 2017-11-03 DIAGNOSIS — M419 Scoliosis, unspecified: Secondary | ICD-10-CM | POA: Diagnosis not present

## 2017-11-03 DIAGNOSIS — I10 Essential (primary) hypertension: Secondary | ICD-10-CM | POA: Diagnosis present

## 2017-11-03 DIAGNOSIS — Z888 Allergy status to other drugs, medicaments and biological substances status: Secondary | ICD-10-CM

## 2017-11-03 DIAGNOSIS — Z981 Arthrodesis status: Secondary | ICD-10-CM

## 2017-11-03 DIAGNOSIS — M1990 Unspecified osteoarthritis, unspecified site: Secondary | ICD-10-CM | POA: Diagnosis present

## 2017-11-03 DIAGNOSIS — M4316 Spondylolisthesis, lumbar region: Secondary | ICD-10-CM | POA: Diagnosis not present

## 2017-11-03 DIAGNOSIS — Z79890 Hormone replacement therapy (postmenopausal): Secondary | ICD-10-CM

## 2017-11-03 DIAGNOSIS — M48062 Spinal stenosis, lumbar region with neurogenic claudication: Secondary | ICD-10-CM | POA: Diagnosis not present

## 2017-11-03 DIAGNOSIS — Z79899 Other long term (current) drug therapy: Secondary | ICD-10-CM | POA: Diagnosis not present

## 2017-11-03 DIAGNOSIS — Z419 Encounter for procedure for purposes other than remedying health state, unspecified: Secondary | ICD-10-CM

## 2017-11-03 DIAGNOSIS — M5416 Radiculopathy, lumbar region: Secondary | ICD-10-CM | POA: Diagnosis not present

## 2017-11-03 DIAGNOSIS — Z885 Allergy status to narcotic agent status: Secondary | ICD-10-CM | POA: Diagnosis not present

## 2017-11-03 DIAGNOSIS — Z87891 Personal history of nicotine dependence: Secondary | ICD-10-CM | POA: Diagnosis not present

## 2017-11-03 DIAGNOSIS — M4322 Fusion of spine, cervical region: Secondary | ICD-10-CM | POA: Diagnosis not present

## 2017-11-03 DIAGNOSIS — M545 Low back pain: Secondary | ICD-10-CM | POA: Diagnosis not present

## 2017-11-03 DIAGNOSIS — Z7982 Long term (current) use of aspirin: Secondary | ICD-10-CM

## 2017-11-03 HISTORY — PX: ANTERIOR LAT LUMBAR FUSION: SHX1168

## 2017-11-03 LAB — POCT I-STAT 4, (NA,K, GLUC, HGB,HCT)
Glucose, Bld: 101 mg/dL — ABNORMAL HIGH (ref 70–99)
HCT: 33 % — ABNORMAL LOW (ref 39.0–52.0)
Hemoglobin: 11.2 g/dL — ABNORMAL LOW (ref 13.0–17.0)
Potassium: 4.3 mmol/L (ref 3.5–5.1)
Sodium: 134 mmol/L — ABNORMAL LOW (ref 135–145)

## 2017-11-03 LAB — GLUCOSE, CAPILLARY
Glucose-Capillary: 175 mg/dL — ABNORMAL HIGH (ref 70–99)
Glucose-Capillary: 146 mg/dL — ABNORMAL HIGH (ref 70–99)
Glucose-Capillary: 152 mg/dL — ABNORMAL HIGH (ref 70–99)
Glucose-Capillary: 144 mg/dL — ABNORMAL HIGH (ref 70–99)

## 2017-11-03 SURGERY — ANTERIOR LATERAL LUMBAR FUSION 1 LEVEL
Anesthesia: General | Site: Spine Lumbar | Laterality: Right

## 2017-11-03 MED ORDER — VITAMIN B-12 1000 MCG PO TABS
1000.0000 ug | ORAL_TABLET | Freq: Every day | ORAL | Status: DC
  Administered 2017-11-04: 1000 ug via ORAL
  Filled 2017-11-03 (×2): qty 1

## 2017-11-03 MED ORDER — GLYCOPYRROLATE PF 0.2 MG/ML IJ SOSY
PREFILLED_SYRINGE | INTRAMUSCULAR | Status: DC | PRN
  Administered 2017-11-03: .2 mg via INTRAVENOUS

## 2017-11-03 MED ORDER — EPHEDRINE 5 MG/ML INJ
INTRAVENOUS | Status: AC
  Filled 2017-11-03: qty 10

## 2017-11-03 MED ORDER — HYDROCODONE-ACETAMINOPHEN 5-325 MG PO TABS
1.0000 | ORAL_TABLET | ORAL | Status: DC | PRN
  Administered 2017-11-03: 1 via ORAL

## 2017-11-03 MED ORDER — ONDANSETRON HCL 4 MG/2ML IJ SOLN
INTRAMUSCULAR | Status: AC
  Filled 2017-11-03: qty 2

## 2017-11-03 MED ORDER — DEXAMETHASONE SODIUM PHOSPHATE 10 MG/ML IJ SOLN
INTRAMUSCULAR | Status: AC
  Filled 2017-11-03: qty 1

## 2017-11-03 MED ORDER — OMEGA-3-ACID ETHYL ESTERS 1 G PO CAPS
1.0000 g | ORAL_CAPSULE | Freq: Every day | ORAL | Status: DC
  Administered 2017-11-04: 1 g via ORAL
  Filled 2017-11-03: qty 1

## 2017-11-03 MED ORDER — METHOCARBAMOL 500 MG PO TABS
ORAL_TABLET | ORAL | Status: AC
  Filled 2017-11-03: qty 1

## 2017-11-03 MED ORDER — CALCIUM-MAGNESIUM-ZINC 500-250-12.5 MG PO TABS: ORAL_TABLET | Freq: Every day | ORAL | Status: DC

## 2017-11-03 MED ORDER — FENTANYL CITRATE (PF) 250 MCG/5ML IJ SOLN
INTRAMUSCULAR | Status: DC | PRN
  Administered 2017-11-03: 50 ug via INTRAVENOUS
  Administered 2017-11-03: 125 ug via INTRAVENOUS
  Administered 2017-11-03: 75 ug via INTRAVENOUS

## 2017-11-03 MED ORDER — ACETAMINOPHEN 500 MG PO TABS: 1000.0000 mg | ORAL_TABLET | Freq: Four times a day (QID) | ORAL | Status: DC | PRN

## 2017-11-03 MED ORDER — MENTHOL 3 MG MT LOZG: 1.0000 | LOZENGE | OROMUCOSAL | Status: DC | PRN

## 2017-11-03 MED ORDER — ASPIRIN EC 81 MG PO TBEC: 81.0000 mg | DELAYED_RELEASE_TABLET | Freq: Every evening | ORAL | Status: DC

## 2017-11-03 MED ORDER — SODIUM CHLORIDE 0.9 % IV SOLN
250.0000 mL | INTRAVENOUS | Status: DC
  Administered 2017-11-03: 250 mL via INTRAVENOUS

## 2017-11-03 MED ORDER — ACETAMINOPHEN 325 MG PO TABS: 650.0000 mg | ORAL_TABLET | ORAL | Status: DC | PRN

## 2017-11-03 MED ORDER — POLYVINYL ALCOHOL 1.4 % OP SOLN
2.0000 [drp] | Freq: Two times a day (BID) | OPHTHALMIC | Status: DC
  Filled 2017-11-03: qty 15

## 2017-11-03 MED ORDER — DOCUSATE SODIUM 100 MG PO CAPS
100.0000 mg | ORAL_CAPSULE | Freq: Two times a day (BID) | ORAL | Status: DC
  Administered 2017-11-03 (×2): 100 mg via ORAL
  Filled 2017-11-03 (×3): qty 1

## 2017-11-03 MED ORDER — LIDOCAINE-EPINEPHRINE 1 %-1:100000 IJ SOLN
INTRAMUSCULAR | Status: AC
  Filled 2017-11-03: qty 1

## 2017-11-03 MED ORDER — 0.9 % SODIUM CHLORIDE (POUR BTL) OPTIME
TOPICAL | Status: DC | PRN
  Administered 2017-11-03: 1000 mL

## 2017-11-03 MED ORDER — IBUPROFEN 200 MG PO TABS: 600.0000 mg | ORAL_TABLET | Freq: Four times a day (QID) | ORAL | Status: DC | PRN

## 2017-11-03 MED ORDER — TRAMADOL HCL 50 MG PO TABS
50.0000 mg | ORAL_TABLET | Freq: Four times a day (QID) | ORAL | Status: DC | PRN
  Administered 2017-11-04: 50 mg via ORAL
  Filled 2017-11-03: qty 1

## 2017-11-03 MED ORDER — THROMBIN 5000 UNITS EX SOLR
CUTANEOUS | Status: DC | PRN
  Administered 2017-11-03 (×2): 5000 [IU] via TOPICAL

## 2017-11-03 MED ORDER — HEMOSTATIC AGENTS (NO CHARGE) OPTIME
TOPICAL | Status: DC | PRN
  Administered 2017-11-03: 1 via TOPICAL

## 2017-11-03 MED ORDER — SUCCINYLCHOLINE CHLORIDE 200 MG/10ML IV SOSY
PREFILLED_SYRINGE | INTRAVENOUS | Status: DC | PRN
  Administered 2017-11-03: 140 mg via INTRAVENOUS

## 2017-11-03 MED ORDER — BISACODYL 10 MG RE SUPP: 10.0000 mg | Freq: Every day | RECTAL | Status: DC | PRN

## 2017-11-03 MED ORDER — ALUM & MAG HYDROXIDE-SIMETH 200-200-20 MG/5ML PO SUSP
30.0000 mL | Freq: Four times a day (QID) | ORAL | Status: DC | PRN
  Administered 2017-11-04: 30 mL via ORAL
  Filled 2017-11-03: qty 30

## 2017-11-03 MED ORDER — METHOCARBAMOL 500 MG PO TABS
500.0000 mg | ORAL_TABLET | Freq: Four times a day (QID) | ORAL | Status: DC | PRN
  Administered 2017-11-03 – 2017-11-04 (×3): 500 mg via ORAL
  Filled 2017-11-03 (×2): qty 1

## 2017-11-03 MED ORDER — HYDROCODONE-ACETAMINOPHEN 7.5-325 MG PO TABS
2.0000 | ORAL_TABLET | ORAL | Status: DC | PRN
  Administered 2017-11-03 – 2017-11-04 (×5): 2 via ORAL
  Filled 2017-11-03 (×5): qty 2

## 2017-11-03 MED ORDER — SENNOSIDES-DOCUSATE SODIUM 8.6-50 MG PO TABS: 1.0000 | ORAL_TABLET | Freq: Every evening | ORAL | Status: DC | PRN

## 2017-11-03 MED ORDER — PROMETHAZINE HCL 25 MG/ML IJ SOLN: 6.2500 mg | INTRAMUSCULAR | Status: DC | PRN

## 2017-11-03 MED ORDER — FENTANYL CITRATE (PF) 100 MCG/2ML IJ SOLN
INTRAMUSCULAR | Status: AC
  Filled 2017-11-03: qty 2

## 2017-11-03 MED ORDER — LIDOCAINE 2% (20 MG/ML) 5 ML SYRINGE
INTRAMUSCULAR | Status: DC | PRN
  Administered 2017-11-03: 50 mg via INTRAVENOUS

## 2017-11-03 MED ORDER — SODIUM CHLORIDE 0.9 % IV SOLN
INTRAVENOUS | Status: DC | PRN
  Administered 2017-11-03: 25 ug/min via INTRAVENOUS

## 2017-11-03 MED ORDER — INSULIN ASPART 100 UNIT/ML ~~LOC~~ SOLN: 4.0000 [IU] | Freq: Three times a day (TID) | SUBCUTANEOUS | Status: DC

## 2017-11-03 MED ORDER — CEFAZOLIN SODIUM-DEXTROSE 2-4 GM/100ML-% IV SOLN
2.0000 g | Freq: Three times a day (TID) | INTRAVENOUS | Status: AC
  Administered 2017-11-03 (×2): 2 g via INTRAVENOUS
  Filled 2017-11-03 (×2): qty 100

## 2017-11-03 MED ORDER — LOSARTAN POTASSIUM 50 MG PO TABS
50.0000 mg | ORAL_TABLET | Freq: Every day | ORAL | Status: DC
  Administered 2017-11-04: 50 mg via ORAL
  Filled 2017-11-03: qty 1

## 2017-11-03 MED ORDER — TAMSULOSIN HCL 0.4 MG PO CAPS
0.4000 mg | ORAL_CAPSULE | Freq: Two times a day (BID) | ORAL | Status: DC
  Administered 2017-11-03 – 2017-11-04 (×2): 0.4 mg via ORAL
  Filled 2017-11-03 (×2): qty 1

## 2017-11-03 MED ORDER — PHENOL 1.4 % MT LIQD
1.0000 | OROMUCOSAL | Status: DC | PRN
  Administered 2017-11-03: 1 via OROMUCOSAL
  Filled 2017-11-03: qty 177

## 2017-11-03 MED ORDER — CEFAZOLIN SODIUM-DEXTROSE 2-4 GM/100ML-% IV SOLN
2.0000 g | INTRAVENOUS | Status: AC
  Administered 2017-11-03: 2 g via INTRAVENOUS
  Filled 2017-11-03: qty 100

## 2017-11-03 MED ORDER — GLYCOPYRROLATE PF 0.2 MG/ML IJ SOSY
PREFILLED_SYRINGE | INTRAMUSCULAR | Status: AC
  Filled 2017-11-03: qty 1

## 2017-11-03 MED ORDER — CHLORHEXIDINE GLUCONATE CLOTH 2 % EX PADS: 6.0000 | MEDICATED_PAD | Freq: Once | CUTANEOUS | Status: DC

## 2017-11-03 MED ORDER — SENNOSIDES-DOCUSATE SODIUM 8.6-50 MG PO TABS
1.0000 | ORAL_TABLET | Freq: Two times a day (BID) | ORAL | Status: DC
  Administered 2017-11-03 – 2017-11-04 (×2): 1 via ORAL
  Filled 2017-11-03 (×2): qty 1

## 2017-11-03 MED ORDER — THROMBIN 5000 UNITS EX SOLR
CUTANEOUS | Status: AC
  Filled 2017-11-03: qty 10000

## 2017-11-03 MED ORDER — PROPOFOL 10 MG/ML IV BOLUS
INTRAVENOUS | Status: AC
  Filled 2017-11-03: qty 40

## 2017-11-03 MED ORDER — PROPOFOL 10 MG/ML IV BOLUS
INTRAVENOUS | Status: DC | PRN
  Administered 2017-11-03: 100 mg via INTRAVENOUS
  Administered 2017-11-03: 20 mg via INTRAVENOUS
  Administered 2017-11-03: 50 mg via INTRAVENOUS

## 2017-11-03 MED ORDER — ADULT MULTIVITAMIN W/MINERALS CH
1.0000 | ORAL_TABLET | Freq: Every day | ORAL | Status: DC
  Administered 2017-11-04: 1 via ORAL
  Filled 2017-11-03: qty 1

## 2017-11-03 MED ORDER — METHOCARBAMOL 1000 MG/10ML IJ SOLN
500.0000 mg | Freq: Four times a day (QID) | INTRAVENOUS | Status: DC | PRN
  Filled 2017-11-03: qty 5

## 2017-11-03 MED ORDER — LACTATED RINGERS IV SOLN: INTRAVENOUS | Status: DC

## 2017-11-03 MED ORDER — ONDANSETRON HCL 4 MG PO TABS: 4.0000 mg | ORAL_TABLET | Freq: Four times a day (QID) | ORAL | Status: DC | PRN

## 2017-11-03 MED ORDER — FLEET ENEMA 7-19 GM/118ML RE ENEM: 1.0000 | ENEMA | Freq: Once | RECTAL | Status: DC | PRN

## 2017-11-03 MED ORDER — SODIUM CHLORIDE 0.9% FLUSH
3.0000 mL | Freq: Two times a day (BID) | INTRAVENOUS | Status: DC
  Administered 2017-11-03 (×2): 3 mL via INTRAVENOUS

## 2017-11-03 MED ORDER — INSULIN ASPART 100 UNIT/ML ~~LOC~~ SOLN: 0.0000 [IU] | Freq: Every day | SUBCUTANEOUS | Status: DC

## 2017-11-03 MED ORDER — PANTOPRAZOLE SODIUM 40 MG PO TBEC
40.0000 mg | DELAYED_RELEASE_TABLET | Freq: Every day | ORAL | Status: DC
  Administered 2017-11-03 – 2017-11-04 (×2): 40 mg via ORAL
  Filled 2017-11-03 (×2): qty 1

## 2017-11-03 MED ORDER — ZOLPIDEM TARTRATE 5 MG PO TABS: 5.0000 mg | ORAL_TABLET | Freq: Every evening | ORAL | Status: DC | PRN

## 2017-11-03 MED ORDER — KCL IN DEXTROSE-NACL 20-5-0.45 MEQ/L-%-% IV SOLN: INTRAVENOUS | Status: DC

## 2017-11-03 MED ORDER — ONDANSETRON HCL 4 MG/2ML IJ SOLN
INTRAMUSCULAR | Status: DC | PRN
  Administered 2017-11-03: 4 mg via INTRAVENOUS

## 2017-11-03 MED ORDER — LACTATED RINGERS IV SOLN
INTRAVENOUS | Status: DC | PRN
  Administered 2017-11-03: 07:00:00 via INTRAVENOUS

## 2017-11-03 MED ORDER — POTASSIUM 99 MG PO TABS: 99.0000 mg | ORAL_TABLET | Freq: Every day | ORAL | Status: DC

## 2017-11-03 MED ORDER — HYDROCODONE-ACETAMINOPHEN 5-325 MG PO TABS
ORAL_TABLET | ORAL | Status: AC
  Filled 2017-11-03: qty 1

## 2017-11-03 MED ORDER — INSULIN ASPART 100 UNIT/ML ~~LOC~~ SOLN: 0.0000 [IU] | Freq: Three times a day (TID) | SUBCUTANEOUS | Status: DC

## 2017-11-03 MED ORDER — ACETAMINOPHEN 650 MG RE SUPP: 650.0000 mg | RECTAL | Status: DC | PRN

## 2017-11-03 MED ORDER — MEPERIDINE HCL 50 MG/ML IJ SOLN: 6.2500 mg | INTRAMUSCULAR | Status: DC | PRN

## 2017-11-03 MED ORDER — MORPHINE SULFATE (PF) 2 MG/ML IV SOLN
2.0000 mg | INTRAVENOUS | Status: DC | PRN
  Administered 2017-11-03: 2 mg via INTRAVENOUS
  Filled 2017-11-03: qty 1

## 2017-11-03 MED ORDER — CELECOXIB 200 MG PO CAPS
200.0000 mg | ORAL_CAPSULE | Freq: Two times a day (BID) | ORAL | Status: DC
  Administered 2017-11-03 – 2017-11-04 (×3): 200 mg via ORAL
  Filled 2017-11-03 (×3): qty 1

## 2017-11-03 MED ORDER — EPHEDRINE SULFATE-NACL 50-0.9 MG/10ML-% IV SOSY
PREFILLED_SYRINGE | INTRAVENOUS | Status: DC | PRN
  Administered 2017-11-03: 10 mg via INTRAVENOUS
  Administered 2017-11-03 (×2): 5 mg via INTRAVENOUS
  Administered 2017-11-03 (×2): 10 mg via INTRAVENOUS

## 2017-11-03 MED ORDER — LIDOCAINE-EPINEPHRINE 1 %-1:100000 IJ SOLN
INTRAMUSCULAR | Status: DC | PRN
  Administered 2017-11-03: 5 mL

## 2017-11-03 MED ORDER — FENTANYL CITRATE (PF) 250 MCG/5ML IJ SOLN
INTRAMUSCULAR | Status: AC
  Filled 2017-11-03: qty 5

## 2017-11-03 MED ORDER — SODIUM CHLORIDE 0.9% FLUSH: 3.0000 mL | INTRAVENOUS | Status: DC | PRN

## 2017-11-03 MED ORDER — BUPIVACAINE HCL (PF) 0.5 % IJ SOLN
INTRAMUSCULAR | Status: DC | PRN
  Administered 2017-11-03: 5 mL

## 2017-11-03 MED ORDER — ONDANSETRON HCL 4 MG/2ML IJ SOLN: 4.0000 mg | Freq: Four times a day (QID) | INTRAMUSCULAR | Status: DC | PRN

## 2017-11-03 MED ORDER — LEVOTHYROXINE SODIUM 75 MCG PO TABS
150.0000 ug | ORAL_TABLET | Freq: Every day | ORAL | Status: DC
  Administered 2017-11-04: 150 ug via ORAL
  Filled 2017-11-03: qty 2

## 2017-11-03 MED ORDER — FENTANYL CITRATE (PF) 100 MCG/2ML IJ SOLN
25.0000 ug | INTRAMUSCULAR | Status: DC | PRN
  Administered 2017-11-03: 50 ug via INTRAVENOUS

## 2017-11-03 MED ORDER — BUPIVACAINE HCL (PF) 0.5 % IJ SOLN
INTRAMUSCULAR | Status: AC
  Filled 2017-11-03: qty 30

## 2017-11-03 MED ORDER — LIDOCAINE 2% (20 MG/ML) 5 ML SYRINGE
INTRAMUSCULAR | Status: AC
  Filled 2017-11-03: qty 5

## 2017-11-03 MED ORDER — DEXAMETHASONE SODIUM PHOSPHATE 10 MG/ML IJ SOLN
INTRAMUSCULAR | Status: DC | PRN
  Administered 2017-11-03: 5 mg via INTRAVENOUS

## 2017-11-03 MED ORDER — SUCCINYLCHOLINE CHLORIDE 200 MG/10ML IV SOSY
PREFILLED_SYRINGE | INTRAVENOUS | Status: AC
  Filled 2017-11-03: qty 10

## 2017-11-03 SURGICAL SUPPLY — 58 items
BLADE CLIPPER SURG (BLADE) IMPLANT
CARTRIDGE OIL MAESTRO DRILL (MISCELLANEOUS) ×1 IMPLANT
DECANTER SPIKE VIAL GLASS SM (MISCELLANEOUS) ×2 IMPLANT
DERMABOND ADVANCED (GAUZE/BANDAGES/DRESSINGS) ×1
DERMABOND ADVANCED .7 DNX12 (GAUZE/BANDAGES/DRESSINGS) ×1 IMPLANT
DIFFUSER DRILL AIR PNEUMATIC (MISCELLANEOUS) ×2 IMPLANT
DRAPE C-ARM 42X72 X-RAY (DRAPES) ×2 IMPLANT
DRAPE C-ARMOR (DRAPES) ×2 IMPLANT
DRAPE LAPAROTOMY 100X72X124 (DRAPES) ×2 IMPLANT
DRSG OPSITE POSTOP 3X4 (GAUZE/BANDAGES/DRESSINGS) ×4 IMPLANT
DURAPREP 26ML APPLICATOR (WOUND CARE) ×2 IMPLANT
ELECT REM PT RETURN 9FT ADLT (ELECTROSURGICAL) ×2
ELECTRODE REM PT RTRN 9FT ADLT (ELECTROSURGICAL) ×1 IMPLANT
GAUZE 4X4 16PLY RFD (DISPOSABLE) IMPLANT
GLOVE BIO SURGEON STRL SZ8 (GLOVE) ×2 IMPLANT
GLOVE BIOGEL PI IND STRL 7.0 (GLOVE) ×2 IMPLANT
GLOVE BIOGEL PI IND STRL 7.5 (GLOVE) ×2 IMPLANT
GLOVE BIOGEL PI IND STRL 8 (GLOVE) ×1 IMPLANT
GLOVE BIOGEL PI IND STRL 8.5 (GLOVE) ×1 IMPLANT
GLOVE BIOGEL PI INDICATOR 7.0 (GLOVE) ×2
GLOVE BIOGEL PI INDICATOR 7.5 (GLOVE) ×2
GLOVE BIOGEL PI INDICATOR 8 (GLOVE) ×1
GLOVE BIOGEL PI INDICATOR 8.5 (GLOVE) ×1
GLOVE ECLIPSE 8.0 STRL XLNG CF (GLOVE) ×2 IMPLANT
GLOVE EXAM NITRILE LRG STRL (GLOVE) IMPLANT
GLOVE EXAM NITRILE XL STR (GLOVE) IMPLANT
GLOVE EXAM NITRILE XS STR PU (GLOVE) IMPLANT
GLOVE SURG SS PI 7.5 STRL IVOR (GLOVE) ×8 IMPLANT
GOWN STRL REUS W/ TWL LRG LVL3 (GOWN DISPOSABLE) ×2 IMPLANT
GOWN STRL REUS W/ TWL XL LVL3 (GOWN DISPOSABLE) ×2 IMPLANT
GOWN STRL REUS W/TWL 2XL LVL3 (GOWN DISPOSABLE) ×2 IMPLANT
GOWN STRL REUS W/TWL LRG LVL3 (GOWN DISPOSABLE) ×2
GOWN STRL REUS W/TWL XL LVL3 (GOWN DISPOSABLE) ×2
KIT BASIN OR (CUSTOM PROCEDURE TRAY) ×2 IMPLANT
KIT DILATOR XLIF 5 (KITS) ×2 IMPLANT
KIT INFUSE XX SMALL 0.7CC (Orthopedic Implant) ×2 IMPLANT
KIT SURGICAL ACCESS MAXCESS 4 (KITS) ×2 IMPLANT
KIT TURNOVER KIT B (KITS) ×2 IMPLANT
MODULE NVM5 NEXT GEN EMG (NEEDLE) ×2 IMPLANT
MODULUS XLW 10X22X55MM 10 (Spine Construct) ×2 IMPLANT
NEEDLE HYPO 25X1 1.5 SAFETY (NEEDLE) ×2 IMPLANT
NS IRRIG 1000ML POUR BTL (IV SOLUTION) ×2 IMPLANT
OIL CARTRIDGE MAESTRO DRILL (MISCELLANEOUS) ×2
PACK LAMINECTOMY NEURO (CUSTOM PROCEDURE TRAY) ×2 IMPLANT
PLATE 2H 10MM (Plate) ×2 IMPLANT
PUTTY BONE ATTRAX 5CC STRIP (Putty) ×2 IMPLANT
SCREW DECADE 5.5X50 (Screw) ×4 IMPLANT
SPONGE LAP 4X18 RFD (DISPOSABLE) IMPLANT
SPONGE SURGIFOAM ABS GEL SZ50 (HEMOSTASIS) ×2 IMPLANT
STAPLER SKIN PROX WIDE 3.9 (STAPLE) ×2 IMPLANT
SUT VIC AB 1 CT1 18XBRD ANBCTR (SUTURE) ×1 IMPLANT
SUT VIC AB 1 CT1 8-18 (SUTURE) ×1
SUT VIC AB 2-0 CT1 18 (SUTURE) ×2 IMPLANT
SUT VIC AB 3-0 SH 8-18 (SUTURE) ×2 IMPLANT
TOWEL GREEN STERILE (TOWEL DISPOSABLE) ×2 IMPLANT
TOWEL GREEN STERILE FF (TOWEL DISPOSABLE) ×2 IMPLANT
TRAY FOLEY MTR SLVR 16FR STAT (SET/KITS/TRAYS/PACK) ×2 IMPLANT
WATER STERILE IRR 1000ML POUR (IV SOLUTION) ×2 IMPLANT

## 2017-11-03 NOTE — Progress Notes (Signed)
Awake, alert, conversant.  Full strength both lower extremities.  Knee pain resolved.  Back is sore.  Doing well.

## 2017-11-03 NOTE — Transfer of Care (Signed)
Immediate Anesthesia Transfer of Care Note  Patient: Kyle Simon  Procedure(s) Performed: Right Lumbar three-four Anterolateral lumbar interbody fusion with lateral plate (Right Spine Lumbar)  Patient Location: PACU  Anesthesia Type:General  Level of Consciousness: awake, alert  and oriented  Airway & Oxygen Therapy: Patient Spontanous Breathing and Patient connected to nasal cannula oxygen  Post-op Assessment: Report given to RN and Post -op Vital signs reviewed and stable  Post vital signs: Reviewed and stable  Last Vitals:  Vitals Value Taken Time  BP 110/77 11/03/2017 10:03 AM  Temp    Pulse 69 11/03/2017 10:03 AM  Resp 16 11/03/2017 10:03 AM  SpO2 100 % 11/03/2017 10:03 AM  Vitals shown include unvalidated device data.  Last Pain:  Vitals:   11/03/17 0646  TempSrc:   PainSc: 3          Complications: No apparent anesthesia complications

## 2017-11-03 NOTE — Brief Op Note (Signed)
11/03/2017  9:52 AM  PATIENT:  Kyle Simon  84 y.o. male  PRE-OPERATIVE DIAGNOSIS:  Lumbar stenosis with neurogenic claudication, lumbar spondylolisthesis, radiculopathy, lumbago, L 34 level  POST-OPERATIVE DIAGNOSIS:   Lumbar stenosis with neurogenic claudication, lumbar spondylolisthesis, radiculopathy, lumbago, L 34 level  PROCEDURE:  Procedure(s): Right Lumbar three-four Anterolateral lumbar interbody fusion with lateral plate (Right)  SURGEON:  Surgeon(s) and Role:    Erline Levine, MD - Primary  PHYSICIAN ASSISTANT:   ASSISTANTS: Poteat, RN   ANESTHESIA:   general  EBL:  30 mL   BLOOD ADMINISTERED:none  DRAINS: none   LOCAL MEDICATIONS USED:  LIDOCAINE   SPECIMEN:  No Specimen  DISPOSITION OF SPECIMEN:  N/A  COUNTS:  YES  TOURNIQUET:  * No tourniquets in log *  DICTATION: Patient is a 84 year old with severe stenosis and retrolisthesis of the lumbar spine with radiculopathy. It was elected to take him to surgery for anterolateral decompression and lateral plate fixation at the L 34 level.  He had previously undergone fusion L 45 level.  Procedure: Patient was brought to the operating room and placed in a left lateral decubitus position on the operative table and using orthogonally projected C-arm fluoroscopy the patient was placed so that the L 34  level was visualized in AP and lateral plane. The patient was then taped into position. The table was flexed so as to expose the L 34 level. Skin was marked along with a posterior finger dissection incision. His flank was then prepped and draped in usual sterile fashion and incisions were made just anterior to the 12 th rib. Posterior finger dissection was made to enter the retroperitoneal space and then subsequently the probe was inserted into the psoas muscle from the right side initially at the L 34 level. After mapping the neural elements were able to dock the probe per the midpoint of this vertebral level and without  indications electrically of too close proximity to the neural tissues. Subsequently the self-retaining tractor was.after sequential dilators were utilized the shim was employed and the interspace was cleared of psoas muscle and then incised. A thorough discectomy was performed. Instruments were used to clear the interspace of disc material. After thorough discectomy was performed and this was performed using AP and lateral fluoroscopy a 10 lordotic by 50 x 22 mm implant was packed with extra extra small BMP and Attrax. This was tamped into position and its position was confirmed on AP and lateral fluoroscopy. A Decade 2 hole lateral plate (size 10) was affixed to the lateral spine with  5.5 x 50 mm bolts. All screws were torque locked and positioning was confirmed with AP and lateral fluoroscopy. . Hemostasis was assured the wounds were irrigated interrupted Vicryl sutures.Sterile occlusive dressing was placed with Dermabond. The patient was then extubated in the operating room and taken to recovery in stable and satisfactory condition having tolerated his operation well. Counts were correct at the end of the case.  PLAN OF CARE: Admit to inpatient   PATIENT DISPOSITION:  PACU - hemodynamically stable.   Delay start of Pharmacological VTE agent (>24hrs) due to surgical blood loss or risk of bleeding: yes

## 2017-11-03 NOTE — Anesthesia Postprocedure Evaluation (Signed)
Anesthesia Post Note  Patient: Kyle Simon  Procedure(s) Performed: Right Lumbar three-four Anterolateral lumbar interbody fusion with lateral plate (Right Spine Lumbar)     Patient location during evaluation: PACU Anesthesia Type: General Level of consciousness: awake and alert Pain management: pain level controlled Vital Signs Assessment: post-procedure vital signs reviewed and stable Respiratory status: spontaneous breathing, nonlabored ventilation, respiratory function stable and patient connected to nasal cannula oxygen Cardiovascular status: blood pressure returned to baseline and stable Postop Assessment: no apparent nausea or vomiting Anesthetic complications: no    Last Vitals:  Vitals:   11/03/17 1040 11/03/17 1055  BP:  (!) 143/81  Pulse: 65 60  Resp: 16 19  Temp:    SpO2: 97% 100%    Last Pain:  Vitals:   11/03/17 1030  TempSrc:   PainSc: [place]

## 2017-11-03 NOTE — Evaluation (Signed)
Occupational Therapy Evaluation Patient Details Name: Kyle Simon MRN: [id number] DOB: 10-Jun-1932 Today's Date: 11/03/2017    History of Present Illness 84 yo male s/p Right Lumbar three-four Anterolateral lumbar interbody fusion with lateral plate  PMHx: R TKA, DM, HTN, torn left biceps tendon, polio as child, L TKA, back surgery, R rotator cuff surgery   Clinical Impression   This 84 yo male admitted with above presents to acute OT with all education completed and handout given to patient and wife. We will D/C from acute OT.    Follow Up Recommendations  No OT follow up;Supervision - Intermittent    Equipment Recommendations  None recommended by OT       Precautions / Restrictions Precautions Precautions: Back Precaution Booklet Issued: Yes (comment) Required Braces or Orthoses: Spinal Brace Spinal Brace: Lumbar corset;Applied in sitting position;Applied in standing position Restrictions Other Position/Activity Restrictions: pt forgot brace at home, will have it here in AM (around 7)      Mobility Bed Mobility Overal bed mobility: Needs Assistance Bed Mobility: Rolling;Sidelying to Sit;Sit to Sidelying Rolling: Mod assist(use of rail, HOB flat) Sidelying to sit: Min assist     Sit to sidelying: Mod assist(HOB flat, A for LEs and VCs for technique)    Transfers Overall transfer level: Needs assistance Equipment used: None Transfers: Sit to/from Stand Sit to Stand: Min guard                  ADL either performed or assessed with clinical judgement   ADL                                         General ADL Comments: Educated pt and wife on use of 2 cups for mouth care, use of wet wipes for back perineal care post bowel movement, sequence of getting dressed, how to place pillows when in bed at home, sitting tolerance time (not laying back down but getting up and moving around so he can then sit for another 20-30 minutes), stance for  sit<>stand from toilet     Vision Patient Visual Report: No change from baseline              Pertinent Vitals/Pain Pain Assessment: 0-10 Pain Score: 6  Pain Location: incisional site Pain Descriptors / Indicators: Grimacing;Guarding;Operative site guarding Pain Intervention(s): Limited activity within patient's tolerance;Monitored during session;Repositioned;Premedicated before session     Hand Dominance Left   Extremity/Trunk Assessment Upper Extremity Assessment Upper Extremity Assessment: Overall WFL for tasks assessed           Communication Communication Communication: No difficulties   Cognition Arousal/Alertness: Awake/alert Behavior During Therapy: WFL for tasks assessed/performed Overall Cognitive Status: Within Functional Limits for tasks assessed                                                Home Living Family/patient expects to be discharged to:: Private residence Living Arrangements: Spouse/significant other Available Help at Discharge: Family;Available 24 hours/day Type of Home: House Home Access: Stairs to enter [employer] of Steps: 1 and 1 Entrance Stairs-Rails: None Home Layout: One level     Bathroom Shower/Tub: Walk-in shower;Door   [employer] Toilet: Handicapped height     Home Equipment: Shower seat;Bedside commode;Walker - 2  wheels;Grab bars - tub/shower;Hand held shower head          Prior Functioning/Environment Level of Independence: Independent        Comments: Loves to play golf, 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 [facility], wife also volunteers at NICU, dter works on [employer] floor        OT Problem List: Decreased range of motion;Impaired balance (sitting and/or standing);Pain;Decreased knowledge of precautions         OT Goals(Current goals can be found in the care plan section) Acute Rehab OT Goals Patient Stated Goal: home tomorrow  OT Frequency:                AM-PAC PT "6 Clicks" Daily  Activity     Outcome Measure Help from another person eating meals?: None Help from another person taking care of personal grooming?: A Little Help from another person toileting, which includes using toliet, bedpan, or urinal?: A Little Help from another person bathing (including washing, rinsing, drying)?: A Little Help from another person to put on and taking off regular upper body clothing?: A Little Help from another person to put on and taking off regular lower body clothing?: A Lot 6 Click Score: 18   End of Session Equipment Utilized During Treatment: Rolling walker Nurse Communication: Mobility status  Activity Tolerance: Patient tolerated treatment well Patient left: in bed;with call bell/phone within reach;with family/visitor present  OT Visit Diagnosis: Pain;Unsteadiness on feet (R26.81) Pain - part of body: (back)                Time: 3810-1751 OT Time Calculation (min): 42 min Charges:  OT General Charges $OT Visit: 1 Visit OT Evaluation $OT Eval Moderate Complexity: 1 Mod OT Treatments $Self Care/Home Management : 23-37 mins  [place]en Circle, OTR/L Acute [employer] Pager [phone] Office [phone]

## 2017-11-03 NOTE — Anesthesia Preprocedure Evaluation (Addendum)
Anesthesia Evaluation  Patient identified by MRN, date of birth, ID band Patient awake    Reviewed: Allergy & Precautions, NPO status , Patient's Chart, lab work & pertinent test results  Airway Mallampati: III   Neck ROM: Full  Mouth opening: Limited Mouth Opening  Dental  (+) Teeth Intact, Dental Advisory Given, Caps   Pulmonary former smoker,    breath sounds clear to auscultation       Cardiovascular hypertension, Pt. on medications + Peripheral Vascular Disease   Rhythm:Regular Rate:Normal     Neuro/Psych  Headaches, negative psych ROS   GI/Hepatic Neg liver ROS, GERD  ,  Endo/Other  diabetesHypothyroidism   Renal/GU      Musculoskeletal  (+) Arthritis , Osteoarthritis,    Abdominal Normal abdominal exam  (+)   Peds  Hematology   Anesthesia Other Findings - HLD  Reproductive/Obstetrics                            Lab Results  Component Value Date   WBC 5.3 10/25/2017   HGB 12.2 (L) 10/25/2017   HCT 37.1 (L) 10/25/2017   MCV 94.6 10/25/2017   PLT 224 10/25/2017   Lab Results  Component Value Date   CREATININE 0.95 10/25/2017   BUN 12 10/25/2017   NA 129 (L) 10/25/2017   K 5.3 (H) 10/25/2017   CL 96 (L) 10/25/2017   CO2 24 10/25/2017   Lab Results  Component Value Date   INR 1.02 12/31/2010   EKG: normal sinus rhythm, PVC's   Anesthesia Physical Anesthesia Plan  ASA: III  Anesthesia Plan: General   Post-op Pain Management:    Induction: Intravenous  PONV Risk Score and Plan: 3 and Ondansetron, Dexamethasone and Treatment may vary due to age or medical condition  Airway Management Planned: Oral ETT  Additional Equipment: None  Intra-op Plan:   Post-operative Plan: Extubation in OR  Informed Consent: I have reviewed the patients History and Physical, chart, labs and discussed the procedure including the risks, benefits and alternatives for the proposed  anesthesia with the patient or authorized representative who has indicated his/her understanding and acceptance.   Dental advisory given  Plan Discussed with: CRNA  Anesthesia Plan Comments:        Anesthesia Quick Evaluation

## 2017-11-03 NOTE — Op Note (Signed)
11/03/2017  9:52 AM  PATIENT:  Kyle Simon  84 y.o. male  PRE-OPERATIVE DIAGNOSIS:  Lumbar stenosis with neurogenic claudication, lumbar spondylolisthesis, radiculopathy, lumbago, L 34 level  POST-OPERATIVE DIAGNOSIS:   Lumbar stenosis with neurogenic claudication, lumbar spondylolisthesis, radiculopathy, lumbago, L 34 level  PROCEDURE:  Procedure(s): Right Lumbar three-four Anterolateral lumbar interbody fusion with lateral plate (Right)  SURGEON:  Surgeon(s) and Role:    Erline Levine, MD - Primary  PHYSICIAN ASSISTANT:   ASSISTANTS: Poteat, RN   ANESTHESIA:   general  EBL:  30 mL   BLOOD ADMINISTERED:none  DRAINS: none   LOCAL MEDICATIONS USED:  LIDOCAINE   SPECIMEN:  No Specimen  DISPOSITION OF SPECIMEN:  N/A  COUNTS:  YES  TOURNIQUET:  * No tourniquets in log *  DICTATION: Patient is a 84 year old with severe stenosis and retrolisthesis of the lumbar spine with radiculopathy. It was elected to take him to surgery for anterolateral decompression and lateral plate fixation at the L 34 level.  He had previously undergone fusion L 45 level.  Procedure: Patient was brought to the operating room and placed in a left lateral decubitus position on the operative table and using orthogonally projected C-arm fluoroscopy the patient was placed so that the L 34  level was visualized in AP and lateral plane. The patient was then taped into position. The table was flexed so as to expose the L 34 level. Skin was marked along with a posterior finger dissection incision. His flank was then prepped and draped in usual sterile fashion and incisions were made just anterior to the 12 th rib. Posterior finger dissection was made to enter the retroperitoneal space and then subsequently the probe was inserted into the psoas muscle from the right side initially at the L 34 level. After mapping the neural elements were able to dock the probe per the midpoint of this vertebral level and without  indications electrically of too close proximity to the neural tissues. Subsequently the self-retaining tractor was.after sequential dilators were utilized the shim was employed and the interspace was cleared of psoas muscle and then incised. A thorough discectomy was performed. Instruments were used to clear the interspace of disc material. After thorough discectomy was performed and this was performed using AP and lateral fluoroscopy a 10 lordotic by 50 x 22 mm implant was packed with extra extra small BMP and Attrax. This was tamped into position and its position was confirmed on AP and lateral fluoroscopy. A Decade 2 hole lateral plate (size 10) was affixed to the lateral spine with  5.5 x 50 mm bolts. All screws were torque locked and positioning was confirmed with AP and lateral fluoroscopy. . Hemostasis was assured the wounds were irrigated interrupted Vicryl sutures.Sterile occlusive dressing was placed with Dermabond. The patient was then extubated in the operating room and taken to recovery in stable and satisfactory condition having tolerated his operation well. Counts were correct at the end of the case.  PLAN OF CARE: Admit to inpatient   PATIENT DISPOSITION:  PACU - hemodynamically stable.   Delay start of Pharmacological VTE agent (>24hrs) due to surgical blood loss or risk of bleeding: yes

## 2017-11-03 NOTE — H&P (Signed)
Patient ID:   [id number] Patient: Kyle Simon  Date of Birth: [birth year] Visit Type: Office Visit   Date: 09/07/2017 09:30 AM Provider: Marchia Meiers. Vertell Limber MD   This 84 year old male presents for MRI review.  HISTORY OF PRESENT ILLNESS:  1.  MRI review  04/29/09 Left L4-5 laminectomy with resection of synovial cyst 10/07/11 L4-5 Laminectomy for synovial cyst right 10/23/13 L4-5 resection of synovial cyst with PLIF   Patient returns to review his MRI.  Patient, 84 y/o male, reports low back pain with right thigh and right knee pain. Patient rates pain at 7/10. Pain reduced by OTC pain medication.  09/06/17 L-spine MRI with and without contrast 1. Prior L4-5 fusion with progressed adjacent level degenerative disc disease and posterior element hypertrophy at L3-4 with grade 1 retrolisthesis resulting in new mild central spinal canal stenosis, severe right and moderate left lateral recess stenosis, and moderate right greater than left neuroforaminal stenosis.         Medical/Surgical/Interim History Reviewed, no change.  Last detailed document date:08/29/2017.     Family History:  Reviewed, no changes.  Last detailed document date:08/29/2017.   Social History: Reviewed, no changes. Last detailed document date: 08/29/2017.    MEDICATIONS: (added, continued or stopped this visit) Started Medication Directions Instruction Stopped   aspirin 81 mg tablet,delayed release take 1 tablet by oral route  every day     Calcium 600 600 mg calcium (1,500 mg) tablet      Celebrex 200 mg capsule take 1 capsule by oral route  every day as needed     fish oil  ORAL      Flomax 0.4 mg capsule take 1 capsule by oral route 2 times every day 1/2 hour following the same meal each day     ibuprofen 600 mg tablet take 1 tablet by oral route 2 times every day with food     losartan 50 mg tablet take 0.5 tablet by oral route  every day     multivitamin tablet take 1 tablet by oral route  every day     probiotic      Synthroid 125 mcg tablet take one tablet by mouth on Tuesday and Thursday     Synthroid 150 mcg tablet take 1 tablet by oral route  every day     Tylenol Extra Strength 500 mg tablet take 2 tablet by oral route  every day     Vitamin B-12 1,000 mcg tablet        ALLERGIES: Ingredient Reaction Medication Name Comment  SIMVASTATIN  Zocor leg cramps   Reviewed, no changes.    PHYSICAL EXAM:   Vitals Date Temp F BP Pulse Ht In Wt Lb BMI BSA Pain Score  09/07/2017  163/73 48 71 190 26.5  4/10      IMPRESSION:   L-spine MRI without and with contrast reveals, L3-4 disc bulge with right-sided lateral recess stenosis. Recommended right L3-4 TFESI and right-sided L3-4 XLIF.  PLAN:  1. Scheduled AP + Lateral scoliosis X-rays 2. right L3-4 TFESI injection with Dr. Maryjean Ka scheduled 3. Nurse education 4. LSO brace fitting 5. right-sided XLIF L3-4 with lateral plate scheduled 6. Follow-up after XLIF  Orders: Office Procedures/Services: Assessment Service Comments  M54.16 Lumbar Spine - Transforaminal - right - L3-L4    Diagnostic Procedures: Assessment Procedure  M43.10 Scoliosis- AP/Lat  M54.16 Lumbar Spine- AP/Lat  Instruction(s)/Education: Assessment Instruction  R03.0 Hypertension education  Z68.26 Dietary management education, guidance, and counseling  Completed Orders (this encounter) Order Details Reason Side Interpretation Result Initial Treatment Date Region  Hypertension education Continue to monitor blood pressure. If blood pressure remains elevated contact primary care provider        Dietary management education, guidance, and counseling patient encouraged to eat a well balanced diet        Scoliosis- AP/Lat      09/07/2017 All Levels to All Levels   Assessment/Plan   # Detail Type Description   1. Assessment Spondylolisthesis, site unspecified (M43.10).       2. Assessment Lumbar radiculopathy (M54.16).   Plan Orders Referral to Clydell Hakim PhD MD for injection. Lumbar Spine - Transforaminal - right - L3-L4.       3. Assessment Lumbar stenosis with neurogenic claudication (M48.062).       4. Assessment Lumbar foraminal stenosis (M99.83).       5. Assessment Body mass index (BMI) 26.0-26.9, adult (Z61.09).   Plan Orders Today's instructions / counseling include(s) Dietary management education, guidance, and counseling. Clinical information/comments: patient encouraged to eat a well balanced diet.       6. Assessment Elevated blood-pressure reading, w/o diagnosis of htn (R03.0).         Pain Management Plan Pain Scale: 4/10. Method: Numeric Pain Intensity Scale. Location: back. Onset: 01/29/2017. Duration: varies. Quality: discomforting. Pain management follow-up plan of care: Patient taking medication as prescribed..              Provider:  Marchia Meiers. Vertell Limber MD  09/07/2017 11:40 AM Dictation edited by: Mirian Mo    CC Providers: Mellody Dance [place],  [place]  [id number]-   Shaili Deveshwar  [facility] [address] [place], [place] [address]-               Electronically signed by Marchia Meiers Vertell Limber MD on 09/08/2017 06:54 PM

## 2017-11-03 NOTE — Interval H&P Note (Signed)
History and Physical Interval Note:  11/03/2017 7:29 AM  Kyle Simon  has presented today for surgery, with the diagnosis of Lumbar stenosis with neurogenic claudication  The various methods of treatment have been discussed with the patient and family. After consideration of risks, benefits and other options for treatment, the patient has consented to  Procedure(s) with comments: Right Lumbar 3-4 Anterolateral lumbar interbody fusion with lateral plate (Right) - Right Lumbar 3-4 Anterolateral lumbar interbody fusion with lateral plate as a surgical intervention .  The patient's history has been reviewed, patient examined, no change in status, stable for surgery.  I have reviewed the patient's chart and labs.  Questions were answered to the patient's satisfaction.     Emmerie Battaglia D

## 2017-11-03 NOTE — Anesthesia Procedure Notes (Addendum)
Procedure Name: Intubation Date/Time: 11/03/2017 7:43 AM Performed by: Colin Benton, CRNA Pre-anesthesia Checklist: Patient identified, Emergency Drugs available, Suction available and Patient being monitored Patient Re-evaluated:Patient Re-evaluated prior to induction Oxygen Delivery Method: Circle system utilized Preoxygenation: Pre-oxygenation with 100% oxygen Induction Type: IV induction Ventilation: Mask ventilation without difficulty and Oral airway inserted - appropriate to patient size Laryngoscope Size: Sabra Heck and 2 Grade View: Grade II Tube type: Oral Tube size: 8.0 mm Number of attempts: 1 Airway Equipment and Method: Stylet Placement Confirmation: ETT inserted through vocal cords under direct vision,  positive ETCO2 and breath sounds checked- equal and bilateral Secured at: 23 cm Tube secured with: Tape Dental Injury: Teeth and Oropharynx as per pre-operative assessment

## 2017-11-04 ENCOUNTER — Encounter (HOSPITAL_COMMUNITY): Payer: Self-pay | Admitting: Neurosurgery

## 2017-11-04 LAB — GLUCOSE, CAPILLARY
Glucose-Capillary: 107 mg/dL — ABNORMAL HIGH (ref 70–99)
Glucose-Capillary: 75 mg/dL (ref 70–99)

## 2017-11-04 MED ORDER — ACETAMINOPHEN 500 MG PO TABS
1000.0000 mg | ORAL_TABLET | Freq: Four times a day (QID) | ORAL | Status: DC | PRN
  Administered 2017-11-04: 1000 mg via ORAL
  Filled 2017-11-04: qty 2

## 2017-11-04 MED ORDER — TRAMADOL HCL 50 MG PO TABS: 50.0000 mg | ORAL_TABLET | Freq: Four times a day (QID) | ORAL | 1 refills | Status: DC | PRN

## 2017-11-04 MED ORDER — METHOCARBAMOL 500 MG PO TABS: 500.0000 mg | ORAL_TABLET | Freq: Four times a day (QID) | ORAL | 1 refills | Status: DC | PRN

## 2017-11-04 NOTE — Discharge Instructions (Signed)

## 2017-11-04 NOTE — Discharge Summary (Signed)
Physician Discharge Summary  Patient ID: Kyle Simon MRN: [id number] DOB/AGE: 1932-08-13 84 y.o.  Admit date: 11/03/2017 Discharge date: 11/04/2017  Admission Diagnoses:Lumbar stenosis with neurogenic claudication, lumbar spondylolisthesis, radiculopathy, lumbago, L 34 level     Discharge Diagnoses: Lumbar stenosis with neurogenic claudication, lumbar spondylolisthesis, radiculopathy, lumbago, L 34 level s/p Right Lumbar three-four Anterolateral lumbar interbody fusion with lateral plate (Right)       Active Problems:   Spondylolisthesis of lumbar region   Discharged Condition: good  Hospital Course: Kyle Simon was admitted for surgery with dx lumbar stenosis and radiculopathy. Following uncomplicated XLIF X7-9, he recovered well and transferred to [facility] for nursing care and therapies. He is mobilizing nicely.  Consults: None  Significant Diagnostic Studies: radiology: X-Ray: intra-op  Treatments: surgery: Right Lumbar three-four Anterolateral lumbar interbody fusion with lateral plate (Right)     Discharge Exam: Blood pressure (!) 129/56, pulse (!) 48, temperature 98.4 F (36.9 C), temperature source Oral, resp. rate 18, SpO2 100 %.  Alert, conversant. Wife present. Pt notes lumbar pain with position changes, but no leg pain. Good strength BLE. Incisions right side without erythema, swelling or drainage beneath honeycomb and Dermabond.     Disposition: Discharge disposition: 01-Home or Self Care  Rx's for Tramadol 50mg  and Robaxin 500mg  for prn home use. Pt verbalizes understanding of d/c instructions and agrees to call Monday for 3-4 week appt.          Allergies as of 11/04/2017      Reactions   Simvastatin Other (See Comments)   MYALGIAS   Oxycodone Other (See Comments)   Severe constipation      Medication List    STOP taking these medications   ibuprofen 600 MG tablet Commonly known as:  ADVIL,MOTRIN     TAKE these medications   acetaminophen 500 MG  tablet Commonly known as:  TYLENOL Take 1,000 mg by mouth every 6 (six) hours as needed for mild pain.   ARTIFICIAL TEARS OP Place 1 drop into both eyes 2 (two) times daily.   aspirin 81 MG tablet Take 81 mg by mouth every evening.   CALCIUM 600 + D PO Take 1 tablet by mouth daily.   CALCIUM-MAGNESIUM-ZINC PO Take 1 tablet by mouth daily.   Fish Oil 1000 MG Caps Take 1,000 mg by mouth every evening.   FLOMAX 0.4 MG Caps capsule Generic drug:  tamsulosin Take 0.4 mg by mouth 2 (two) times daily.   glucose blood test strip USE TO CHECK BLOOD SUGAR ONCE DAILY DX E11.9   LANCETS ULTRA THIN 30G Misc Patient is to use to check blood glucose in the AM fasting and 2 hours after largest meal.   losartan 50 MG tablet Commonly known as:  COZAAR Take 0.5 tablets (25 mg total) by mouth daily. What changed:  how much to take   methocarbamol 500 MG tablet Commonly known as:  ROBAXIN Take 1 tablet (500 mg total) by mouth every 6 (six) hours as needed for muscle spasms.   multivitamin with minerals Tabs tablet Take 1 tablet by mouth daily.   NONFORMULARY OR COMPOUNDED ITEM Glucometer (Precision XTRA- Model #[id number])   Potassium 99 MG Tabs Take 99 mg by mouth daily.   RESTORA Caps One tab qd What changed:    how much to take  how to take this  when to take this  additional instructions   SYNTHROID 150 MCG tablet Generic drug:  levothyroxine TAKE 1 TABLET DAILY BEFORE BREAKFAST  traMADol 50 MG tablet Commonly known as:  ULTRAM Take 1 tablet (50 mg total) by mouth every 6 (six) hours as needed for moderate pain.   vitamin B-12 1000 MCG tablet Commonly known as:  CYANOCOBALAMIN Take 1,000 mcg by mouth daily.        Signed: Verdis Prime 11/04/2017, 1:19 PM   Patient is doing well.  Discharge home.

## 2017-11-04 NOTE — Progress Notes (Addendum)
Subjective: Patient reports "I feel fine"  Objective: Vital signs in last 24 hours: Temp:  [97.7 F (36.5 C)-98.4 F (36.9 C)] 98.4 F (36.9 C) (09/06 1214) Pulse Rate:  [43-60] 48 (09/06 1214) Resp:  [8-18] 18 (09/06 1214) BP: (127-153)/(56-80) 129/56 (09/06 1214) SpO2:  [98 %-100 %] 100 % (09/06 1214)  Intake/Output from previous day: 09/05 0701 - 09/06 0700 In: 1040 [P.O.:120; I.V.:920] Out: 2630 [Urine:2600; Blood:30] Intake/Output this shift: No intake/output data recorded.  Alert, conversant. Wife present. Pt notes lumbar pain with position changes, but no leg pain. Good strength BLE. Incisions right side without erythema, swelling or drainage beneath honeycomb and Dermabond.  Lab Results: Recent Labs    11/03/17 0645  HGB 11.2*  HCT 33.0*   BMET Recent Labs    11/03/17 0645  NA 134*  K 4.3  GLUCOSE 101*    Studies/Results: Dg Lumbar Spine 2-3 Views  Result Date: 11/03/2017 CLINICAL DATA:  L3-4 fusion with lateral plate EXAM: LUMBAR SPINE - 2-3 VIEW COMPARISON:  MR lumbar spine of 09/06/2017 FINDINGS: Two C-arm spot films show right lateral fusion at L3-4. Interbody fusion plug appears to be in good position with normal alignment. No complicating features are seen. L4-5 with interbody fusion plug present. IMPRESSION: Right lateral fusion of L3-4.  No complicating features. Electronically Signed   By: Ivar Drape M.D.   On: 11/03/2017 11:56   Dg C-arm 1-60 Min  Result Date: 11/03/2017 CLINICAL DATA:  Fusion at L3-4 EXAM: DG C-ARM 61-120 MIN COMPARISON:  MR lumbar spine 09/06/2017 FINDINGS: Two C-arm spot films were returned. These show right lateral fusion at L3-4 with interbody fusion plug present in good position. Interbody fusion at L4-5 is again noted. Fluoroscopy time 1 minutes 54 seconds was recorded. IMPRESSION: C-arm fluoroscopy provided during fusion at L3-4. Electronically Signed   By: Ivar Drape M.D.   On: 11/03/2017 11:57   Dg C-arm 1-60 Min  Result  Date: 11/03/2017 CLINICAL DATA:  L3-4 lumbar fusion with lateral plate EXAM: DG C-ARM 61-120 MIN COMPARISON:  MR lumbar spine of 09/06/2016 FINDINGS: C-arm fluoroscopy was provided during fusion at L3-4. Fluoroscopy time of 1 minutes 54 seconds was recorded. IMPRESSION: C-arm fluoroscopy provided. Electronically Signed   By: Ivar Drape M.D.   On: 11/03/2017 11:54    Assessment/Plan: Immproving  LOS: 1 day  Per [name], d/c IV, d/c to home. Rx's for Tramadol 50mg  and Robaxin 500mg  for prn home use. Pt verbalizes understanding of d/c instructions and agrees to call Monday for 3-4 week appt.   Verdis Prime 11/04/2017, 1:16 PM   Patient is doing well.  Discharge home.

## 2017-11-04 NOTE — Progress Notes (Signed)
Patient alert and oriented, mae's well, voiding adequate amount of urine, swallowing without difficulty, no c/o pain at time of discharge. Patient discharged home with family. Script and discharged instructions given to patient. Patient and family stated understanding of instructions given. Patient has an appointment with Dr.Stern    

## 2017-11-04 NOTE — Evaluation (Signed)
Physical Therapy Evaluation and Discharge Patient Details Name: Kyle Simon MRN: [id number] DOB: 07/19/1932 Today's Date: 11/04/2017   History of Present Illness  84 yo male s/p Right Lumbar three-four Anterolateral lumbar interbody fusion with lateral plate  PMHx: R TKA, DM, HTN, torn left biceps tendon, polio as child, L TKA, back surgery, R rotator cuff surgery  Clinical Impression  Patient evaluated by Physical Therapy with no further acute PT needs identified. Patient ambulating hallway distances with no assistive device and able to negotiate a flight of steps without difficulty. Reviewed positioning, bed mobility, and precautions with daily activities. All education has been completed and the patient has no further questions. No follow-up Physical Therapy or equipment needs. PT is signing off. Thank you for this referral.     Follow Up Recommendations No PT follow up    Equipment Recommendations  None recommended by PT    Recommendations for Other Services       Precautions / Restrictions Precautions Precautions: Back Precaution Booklet Issued: Yes (comment) Precaution Comments: reviewed appropriate seated/sleeping positions, bed mobility Required Braces or Orthoses: Spinal Brace Spinal Brace: Lumbar corset;Applied in sitting position;Applied in standing position      Mobility  Bed Mobility Overal bed mobility: Modified Independent             General bed mobility comments: good log roll technique  Transfers Overall transfer level: Modified independent Equipment used: None                Ambulation/Gait Ambulation/Gait assistance: Modified independent (Device/Increase time) Gait Distance (Feet): 400 Feet Assistive device: None Gait Pattern/deviations: Decreased step length - left;Step-through pattern     General Gait Details: Patient with forward head posture throughout gait; no unsteadiness noted  Stairs Stairs: Yes Stairs assistance:  Supervision Stair Management: One rail Right Number of Stairs: 10 General stair comments: supervision for safety  Wheelchair Mobility    Modified Rankin (Stroke Patients Only)       Balance Overall balance assessment: No apparent balance deficits (not formally assessed)                                           Pertinent Vitals/Pain Pain Assessment: Faces Faces Pain Scale: Hurts a little bit Pain Location: incisional site Pain Descriptors / Indicators: Grimacing;Guarding;Operative site guarding Pain Intervention(s): Monitored during session    Home Living Family/patient expects to be discharged to:: Private residence Living Arrangements: Spouse/significant other Available Help at Discharge: Family;Available 24 hours/day Type of Home: House Home Access: Stairs to enter Entrance Stairs-Rails: None Entrance Stairs-Number of Steps: 1 and 1 Home Layout: One level Home Equipment: Shower seat;Bedside commode;Walker - 2 wheels;Grab bars - tub/shower;Hand held shower head      Prior Function Level of Independence: Independent         Comments: Loves to play golf, 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 [employer], wife also volunteers at NICU, dter works on [employer]   Dominant Hand: Left    Extremity/Trunk Assessment   Upper Extremity Assessment Upper Extremity Assessment: Overall WFL for tasks assessed    Lower Extremity Assessment Lower Extremity Assessment: Overall WFL for tasks assessed    Cervical / Trunk Assessment Cervical / Trunk Assessment: Other exceptions Cervical / Trunk Exceptions: forward head posture  Communication   Communication: No difficulties  Cognition Arousal/Alertness: Awake/alert Behavior During Therapy: [facility]  for tasks assessed/performed Overall Cognitive Status: Within Functional Limits for tasks assessed                                        General Comments      Exercises      Assessment/Plan    PT Assessment Patent does not need any further PT services  PT Problem List         PT Treatment Interventions      PT Goals (Current goals can be found in the Care Plan section)  Acute Rehab PT Goals Patient Stated Goal: return to volunteering, piloting PT Goal Formulation: All assessment and education complete, DC therapy    Frequency     Barriers to discharge        Co-evaluation               AM-PAC PT "6 Clicks" Daily Activity  Outcome Measure Difficulty turning over in bed (including adjusting bedclothes, sheets and blankets)?: None Difficulty moving from lying on back to sitting on the side of the bed? : None Difficulty sitting down on and standing up from a chair with arms (e.g., wheelchair, bedside commode, etc,.)?: None Help needed moving to and from a bed to chair (including a wheelchair)?: None Help needed walking in hospital room?: None Help needed climbing 3-5 steps with a railing? : A Little 6 Click Score: 23    End of Session Equipment Utilized During Treatment: Gait belt;Back brace Activity Tolerance: Patient tolerated treatment well Patient left: in bed;with call bell/phone within reach;with family/visitor present Nurse Communication: Mobility status PT Visit Diagnosis: Difficulty in walking, not elsewhere classified (R26.2);Pain Pain - part of body: (back)    Time: 9373-4287 PT Time Calculation (min) (ACUTE ONLY): 20 min   Charges:   PT Evaluation $PT Eval Low Complexity: 1 Low         Ellamae Sia, PT, DPT Acute Rehabilitation Services  Pager: [phone]   Willy Eddy 11/04/2017, 10:48 AM

## 2017-11-07 MED ORDER — LOSARTAN POTASSIUM 50 MG PO TABS: 50.0000 mg | ORAL_TABLET | Freq: Every day | ORAL | 1 refills | Status: DC

## 2017-11-07 NOTE — Telephone Encounter (Signed)
Patient thought Cardiology changed the dose at [facility] on 10/05/2017.  Patient has been taking 1 full tablet daily of losartan 50 mg instead of the 1/2 tablet daily since 10/06/2017.  Per Cardiology notes and Dr. Jillyn Ledger message change was not made at cardiology visit.  Spoke to patient's wife (June) patient has been checking and recording BP readings since change and per patient BP has been running 135-140/75-80.  Patient's wife is unable to located BP log, but will bring by the office as soon as she does. FYI: Patient had back surgery on 11/03/2017. MPulliam, CMA/RT(R)

## 2017-11-07 NOTE — Telephone Encounter (Signed)
If cardiology changed his medication then he can get that medication from them.   Otherwise if he wants to get it from [place] he would need to bring in data of exactly when he change the dose from what to what, what his blood pressures been running etc.

## 2017-12-05 DIAGNOSIS — Z6825 Body mass index (BMI) 25.0-25.9, adult: Secondary | ICD-10-CM | POA: Diagnosis not present

## 2017-12-05 DIAGNOSIS — I10 Essential (primary) hypertension: Secondary | ICD-10-CM | POA: Diagnosis not present

## 2017-12-05 DIAGNOSIS — M5416 Radiculopathy, lumbar region: Secondary | ICD-10-CM | POA: Diagnosis not present

## 2017-12-05 DIAGNOSIS — M9983 Other biomechanical lesions of lumbar region: Secondary | ICD-10-CM | POA: Diagnosis not present

## 2017-12-05 DIAGNOSIS — M4310 Spondylolisthesis, site unspecified: Secondary | ICD-10-CM | POA: Diagnosis not present

## 2017-12-05 DIAGNOSIS — M48062 Spinal stenosis, lumbar region with neurogenic claudication: Secondary | ICD-10-CM | POA: Diagnosis not present

## 2017-12-05 DIAGNOSIS — M7138 Other bursal cyst, other site: Secondary | ICD-10-CM | POA: Diagnosis not present

## 2017-12-18 ENCOUNTER — Other Ambulatory Visit: Payer: Self-pay | Admitting: Family Medicine

## 2018-01-11 DIAGNOSIS — L570 Actinic keratosis: Secondary | ICD-10-CM | POA: Diagnosis not present

## 2018-01-19 DIAGNOSIS — E1151 Type 2 diabetes mellitus with diabetic peripheral angiopathy without gangrene: Secondary | ICD-10-CM | POA: Diagnosis not present

## 2018-01-19 DIAGNOSIS — I739 Peripheral vascular disease, unspecified: Secondary | ICD-10-CM | POA: Diagnosis not present

## 2018-01-19 DIAGNOSIS — L84 Corns and callosities: Secondary | ICD-10-CM | POA: Diagnosis not present

## 2018-01-19 DIAGNOSIS — L603 Nail dystrophy: Secondary | ICD-10-CM | POA: Diagnosis not present

## 2018-01-25 DIAGNOSIS — M48062 Spinal stenosis, lumbar region with neurogenic claudication: Secondary | ICD-10-CM | POA: Diagnosis not present

## 2018-02-07 DIAGNOSIS — H40013 Open angle with borderline findings, low risk, bilateral: Secondary | ICD-10-CM | POA: Diagnosis not present

## 2018-02-07 DIAGNOSIS — H40003 Preglaucoma, unspecified, bilateral: Secondary | ICD-10-CM | POA: Diagnosis not present

## 2018-02-07 DIAGNOSIS — H0102A Squamous blepharitis right eye, upper and lower eyelids: Secondary | ICD-10-CM | POA: Diagnosis not present

## 2018-02-07 DIAGNOSIS — Z9849 Cataract extraction status, unspecified eye: Secondary | ICD-10-CM | POA: Diagnosis not present

## 2018-02-07 DIAGNOSIS — Z961 Presence of intraocular lens: Secondary | ICD-10-CM | POA: Diagnosis not present

## 2018-02-07 DIAGNOSIS — H11823 Conjunctivochalasis, bilateral: Secondary | ICD-10-CM | POA: Diagnosis not present

## 2018-02-07 DIAGNOSIS — H40052 Ocular hypertension, left eye: Secondary | ICD-10-CM | POA: Diagnosis not present

## 2018-02-07 DIAGNOSIS — E119 Type 2 diabetes mellitus without complications: Secondary | ICD-10-CM | POA: Diagnosis not present

## 2018-02-07 DIAGNOSIS — H18413 Arcus senilis, bilateral: Secondary | ICD-10-CM | POA: Diagnosis not present

## 2018-02-07 DIAGNOSIS — H11153 Pinguecula, bilateral: Secondary | ICD-10-CM | POA: Diagnosis not present

## 2018-02-07 DIAGNOSIS — H0102B Squamous blepharitis left eye, upper and lower eyelids: Secondary | ICD-10-CM | POA: Diagnosis not present

## 2018-02-07 LAB — HM DIABETES EYE EXAM

## 2018-03-14 NOTE — Progress Notes (Deleted)
 Office Visit Note  Patient: Kyle Simon             Date of Birth: 06/28/1932           MRN: [id number]             PCP: Opalski, Deborah, DO Referring: Opalski, Deborah, DO Visit Date: 03/28/2018 Occupation: @GUAROCC@  Subjective:  No chief complaint on file.   History of Present Illness: Kyle Simon is a 85 y.o. male ***   Activities of Daily Living:  Patient reports morning stiffness for *** {minute/hour:19697}.   Patient {ACTIONS;DENIES/REPORTS:21021675::"Denies"} nocturnal pain.  Difficulty dressing/grooming: {ACTIONS;DENIES/REPORTS:21021675::"Denies"} Difficulty climbing stairs: {ACTIONS;DENIES/REPORTS:21021675::"Denies"} Difficulty getting out of chair: {ACTIONS;DENIES/REPORTS:21021675::"Denies"} Difficulty using hands for taps, buttons, cutlery, and/or writing: {ACTIONS;DENIES/REPORTS:21021675::"Denies"}  No Rheumatology ROS completed.   PMFS History:  Patient Active Problem List   Diagnosis Date Noted  . SOB (shortness of breath) 10/05/2017  . Aortic atherosclerosis (HCC) 10/05/2017  . Fatigue 08/22/2017  . Chronic pain of right knee 08/22/2017  . Stressful life event affecting family-loss on May 31st 08/22/2017  . Inactivity-recently due to knee pain 08/22/2017  . Lumbar radiculopathy 06/16/2017  . Total knee replacement status, right 04/14/2017  . DM assoc with CKD (chronic kidney disease), stage III (HCC) 04/14/2017  . Arthralgia of right temporomandibular joint 03/02/2017  . Chronic eczematous otitis externa of both ears 03/02/2017  . Presbycusis of both ears 03/02/2017  . H/O total knee replacement, bilateral 11/22/2016  . ETD (Eustachian tube dysfunction), right 09/15/2016  . Strain of masseter muscle 09/15/2016  . Trochanteric bursitis of right hip 09/07/2016  . Age-related osteoporosis without current pathological fracture 07/16/2016  . History of lumbar fusion 07/16/2016  . History of total knee arthroplasty, left 07/16/2016  . Cough  01/04/2016  . High risk medications (not anticoagulants) long-term use 01/04/2016  . Hypomagnesemia 12/29/2015  . h/o Hyponatremia 12/29/2015  . Cerumen impaction 08/25/2015  . Diabetes mellitus without complication (HCC) 08/05/2015  . Flatulence symptom 07/25/2015  . Diarrhea 06/21/2015  . Chronic rhinitis 11/01/2014  . Hypertension associated with diabetes (HCC) 03/20/2014  . Spondylolisthesis of lumbar region 10/23/2013  . Diverticulosis of colon without hemorrhage 01/22/2013  . B12 deficiency anemia 01/22/2013  . Anemia, unspecified 08/13/2012  . Synovial cyst of lumbar spine 07/26/2011  . Spasms of the hands or feet 07/26/2011  . GERD (gastroesophageal reflux disease) 03/26/2011  . SENILE KERATOSIS 06/12/2008  . Hyperlipidemia associated with type 2 diabetes mellitus (HCC) 04/19/2007  . SNORING, HX OF 04/19/2007  . BPH (benign prostatic hyperplasia) 04/18/2007  . Hypothyroidism 04/20/2006    Past Medical History:  Diagnosis Date  . Arthritis    both hands;Dr.Deveshwar  . Constipation due to pain medication   . Diabetes mellitus without complication (HCC)    Type II. Uses no medication. Pt controls with diet and weight  . Enlarged prostate   . Frequent urination at night   . GERD (gastroesophageal reflux disease)   . Hyperlipidemia   . Hypertension   . Hypothyroidism   . Polio    as a child w/o complications  . Snoring    no sleep apnea  . Torn meniscus   . Transfusion history 2012   post TKR    Family History  Problem Relation Age of Onset  . Sudden death Father        MVA  . Coronary artery disease Mother   . Emphysema Mother   . Diverticulosis Mother   . Thyroid disease Mother   .    Office Visit Note  Patient: Kyle Simon             Date of Birth: 10/14/1932           MRN: [id number]             PCP: Opalski, Deborah, DO Referring: Opalski, Deborah, DO Visit Date: 03/28/2018 Occupation: @GUAROCC@  Subjective:  No chief complaint on file.   History of Present Illness: Kyle Simon is a 85 y.o. male ***   Activities of Daily Living:  Patient reports morning stiffness for *** {minute/hour:19697}.   Patient {ACTIONS;DENIES/REPORTS:21021675::"Denies"} nocturnal pain.  Difficulty dressing/grooming: {ACTIONS;DENIES/REPORTS:21021675::"Denies"} Difficulty climbing stairs: {ACTIONS;DENIES/REPORTS:21021675::"Denies"} Difficulty getting out of chair: {ACTIONS;DENIES/REPORTS:21021675::"Denies"} Difficulty using hands for taps, buttons, cutlery, and/or writing: {ACTIONS;DENIES/REPORTS:21021675::"Denies"}  No Rheumatology ROS completed.   PMFS History:  Patient Active Problem List   Diagnosis Date Noted  . SOB (shortness of breath) 10/05/2017  . Aortic atherosclerosis (HCC) 10/05/2017  . Fatigue 08/22/2017  . Chronic pain of right knee 08/22/2017  . Stressful life event affecting family-loss on May 31st 08/22/2017  . Inactivity-recently due to knee pain 08/22/2017  . Lumbar radiculopathy 06/16/2017  . Total knee replacement status, right 04/14/2017  . DM assoc with CKD (chronic kidney disease), stage III (HCC) 04/14/2017  . Arthralgia of right temporomandibular joint 03/02/2017  . Chronic eczematous otitis externa of both ears 03/02/2017  . Presbycusis of both ears 03/02/2017  . H/O total knee replacement, bilateral 11/22/2016  . ETD (Eustachian tube dysfunction), right 09/15/2016  . Strain of masseter muscle 09/15/2016  . Trochanteric bursitis of right hip 09/07/2016  . Age-related osteoporosis without current pathological fracture 07/16/2016  . History of lumbar fusion 07/16/2016  . History of total knee arthroplasty, left 07/16/2016  . Cough  01/04/2016  . High risk medications (not anticoagulants) long-term use 01/04/2016  . Hypomagnesemia 12/29/2015  . h/o Hyponatremia 12/29/2015  . Cerumen impaction 08/25/2015  . Diabetes mellitus without complication (HCC) 08/05/2015  . Flatulence symptom 07/25/2015  . Diarrhea 06/21/2015  . Chronic rhinitis 11/01/2014  . Hypertension associated with diabetes (HCC) 03/20/2014  . Spondylolisthesis of lumbar region 10/23/2013  . Diverticulosis of colon without hemorrhage 01/22/2013  . B12 deficiency anemia 01/22/2013  . Anemia, unspecified 08/13/2012  . Synovial cyst of lumbar spine 07/26/2011  . Spasms of the hands or feet 07/26/2011  . GERD (gastroesophageal reflux disease) 03/26/2011  . SENILE KERATOSIS 06/12/2008  . Hyperlipidemia associated with type 2 diabetes mellitus (HCC) 04/19/2007  . SNORING, HX OF 04/19/2007  . BPH (benign prostatic hyperplasia) 04/18/2007  . Hypothyroidism 04/20/2006    Past Medical History:  Diagnosis Date  . Arthritis    both hands;Dr.Deveshwar  . Constipation due to pain medication   . Diabetes mellitus without complication (HCC)    Type II. Uses no medication. Pt controls with diet and weight  . Enlarged prostate   . Frequent urination at night   . GERD (gastroesophageal reflux disease)   . Hyperlipidemia   . Hypertension   . Hypothyroidism   . Polio    as a child w/o complications  . Snoring    no sleep apnea  . Torn meniscus   . Transfusion history 2012   post TKR    Family History  Problem Relation Age of Onset  . Sudden death Father        MVA  . Coronary artery disease Mother   . Emphysema Mother   . Diverticulosis Mother   . Thyroid disease Mother   .    Office Visit Note  Patient: Kyle Simon             Date of Birth: 10/14/1932           MRN: [id number]             PCP: Opalski, Deborah, DO Referring: Opalski, Deborah, DO Visit Date: 03/28/2018 Occupation: @GUAROCC@  Subjective:  No chief complaint on file.   History of Present Illness: Kyle Simon is a 85 y.o. male ***   Activities of Daily Living:  Patient reports morning stiffness for *** {minute/hour:19697}.   Patient {ACTIONS;DENIES/REPORTS:21021675::"Denies"} nocturnal pain.  Difficulty dressing/grooming: {ACTIONS;DENIES/REPORTS:21021675::"Denies"} Difficulty climbing stairs: {ACTIONS;DENIES/REPORTS:21021675::"Denies"} Difficulty getting out of chair: {ACTIONS;DENIES/REPORTS:21021675::"Denies"} Difficulty using hands for taps, buttons, cutlery, and/or writing: {ACTIONS;DENIES/REPORTS:21021675::"Denies"}  No Rheumatology ROS completed.   PMFS History:  Patient Active Problem List   Diagnosis Date Noted  . SOB (shortness of breath) 10/05/2017  . Aortic atherosclerosis (HCC) 10/05/2017  . Fatigue 08/22/2017  . Chronic pain of right knee 08/22/2017  . Stressful life event affecting family-loss on May 31st 08/22/2017  . Inactivity-recently due to knee pain 08/22/2017  . Lumbar radiculopathy 06/16/2017  . Total knee replacement status, right 04/14/2017  . DM assoc with CKD (chronic kidney disease), stage III (HCC) 04/14/2017  . Arthralgia of right temporomandibular joint 03/02/2017  . Chronic eczematous otitis externa of both ears 03/02/2017  . Presbycusis of both ears 03/02/2017  . H/O total knee replacement, bilateral 11/22/2016  . ETD (Eustachian tube dysfunction), right 09/15/2016  . Strain of masseter muscle 09/15/2016  . Trochanteric bursitis of right hip 09/07/2016  . Age-related osteoporosis without current pathological fracture 07/16/2016  . History of lumbar fusion 07/16/2016  . History of total knee arthroplasty, left 07/16/2016  . Cough  01/04/2016  . High risk medications (not anticoagulants) long-term use 01/04/2016  . Hypomagnesemia 12/29/2015  . h/o Hyponatremia 12/29/2015  . Cerumen impaction 08/25/2015  . Diabetes mellitus without complication (HCC) 08/05/2015  . Flatulence symptom 07/25/2015  . Diarrhea 06/21/2015  . Chronic rhinitis 11/01/2014  . Hypertension associated with diabetes (HCC) 03/20/2014  . Spondylolisthesis of lumbar region 10/23/2013  . Diverticulosis of colon without hemorrhage 01/22/2013  . B12 deficiency anemia 01/22/2013  . Anemia, unspecified 08/13/2012  . Synovial cyst of lumbar spine 07/26/2011  . Spasms of the hands or feet 07/26/2011  . GERD (gastroesophageal reflux disease) 03/26/2011  . SENILE KERATOSIS 06/12/2008  . Hyperlipidemia associated with type 2 diabetes mellitus (HCC) 04/19/2007  . SNORING, HX OF 04/19/2007  . BPH (benign prostatic hyperplasia) 04/18/2007  . Hypothyroidism 04/20/2006    Past Medical History:  Diagnosis Date  . Arthritis    both hands;Dr.Deveshwar  . Constipation due to pain medication   . Diabetes mellitus without complication (HCC)    Type II. Uses no medication. Pt controls with diet and weight  . Enlarged prostate   . Frequent urination at night   . GERD (gastroesophageal reflux disease)   . Hyperlipidemia   . Hypertension   . Hypothyroidism   . Polio    as a child w/o complications  . Snoring    no sleep apnea  . Torn meniscus   . Transfusion history 2012   post TKR    Family History  Problem Relation Age of Onset  . Sudden death Father        MVA  . Coronary artery disease Mother   . Emphysema Mother   . Diverticulosis Mother   . Thyroid disease Mother   .

## 2018-03-28 ENCOUNTER — Ambulatory Visit: Payer: Medicare Other | Admitting: Rheumatology

## 2018-03-31 ENCOUNTER — Other Ambulatory Visit: Payer: Self-pay | Admitting: Family Medicine

## 2018-04-07 MED ORDER — NONFORMULARY OR COMPOUNDED ITEM: 0 refills | Status: DC

## 2018-04-10 ENCOUNTER — Telehealth: Payer: Self-pay | Admitting: Family Medicine

## 2018-04-10 NOTE — Telephone Encounter (Signed)
Pt scheduled appt for 2/13 says he needs his 6 month f/u & A1c check,but his last visit in June 2019 says ,he needed a 6 week f/u for losartan increase--forwarding message to medical assistant contact pt he needs to know if he needs to get Blood drawn before his OV--glh

## 2018-04-11 NOTE — Telephone Encounter (Signed)
Called and notified patient that no labs were ordered and per Dr. Raliegh Scarlet we can obtain any needed labs at [facility]. MPulliam, CMA/RT(R)

## 2018-04-12 ENCOUNTER — Ambulatory Visit (INDEPENDENT_AMBULATORY_CARE_PROVIDER_SITE_OTHER): Payer: Medicare Other | Admitting: Family Medicine

## 2018-04-12 ENCOUNTER — Encounter: Payer: Self-pay | Admitting: Family Medicine

## 2018-04-12 VITALS — BP 120/72 | HR 54 | Temp 98.2°F | Ht 71.0 in | Wt 193.0 lb

## 2018-04-12 DIAGNOSIS — Z6379 Other stressful life events affecting family and household: Secondary | ICD-10-CM | POA: Diagnosis not present

## 2018-04-12 DIAGNOSIS — I152 Hypertension secondary to endocrine disorders: Secondary | ICD-10-CM

## 2018-04-12 DIAGNOSIS — E119 Type 2 diabetes mellitus without complications: Secondary | ICD-10-CM | POA: Diagnosis not present

## 2018-04-12 DIAGNOSIS — I10 Essential (primary) hypertension: Secondary | ICD-10-CM | POA: Diagnosis not present

## 2018-04-12 DIAGNOSIS — R5383 Other fatigue: Secondary | ICD-10-CM | POA: Diagnosis not present

## 2018-04-12 DIAGNOSIS — E1159 Type 2 diabetes mellitus with other circulatory complications: Secondary | ICD-10-CM | POA: Diagnosis not present

## 2018-04-12 LAB — POCT GLYCOSYLATED HEMOGLOBIN (HGB A1C): Hemoglobin A1C: 6.4 % — AB (ref 4.0–5.6)

## 2018-04-12 NOTE — Progress Notes (Signed)
Impression and Recommendations:    1. Diabetes mellitus without complication ([name])   2. Hypertension associated with diabetes ([name])   3. Fatigue, unspecified type   4. Stressful life event affecting family- moving home with wife     1. Diet-Controlled Diabetes Mellitus Without Complication - [name] is 6.4 today, up from 6.2 last check. - Continues to be controlled at this time.  No changes to treatment made today.  - If patient wishes to check his blood glucose once weekly, encouraged to check fasting values after he's eaten especially poorly the night before, or 2 hours post-prandial after he's eaten particularly poorly.  - Counseled patient on pathophysiology of disease and discussed various treatment options, which includes continued dietary and lifestyle modifications as first line.  Importance of low carb/ketogenic diet discussed with patient in addition to regular exercise.   - Being a diabetic, you need yearly eye and foot exams. Make appt.for diabetic eye exam  2. Hypertension Associated with Diabetes Mellitus - Stable at this time, improved from prior.  - Encouraged patient to continue to keep an eye on blood pressure at home.   - Ambulatory blood pressure monitoring encouraged.  Keep a log and bring to next OV. - Reviewed goal blood pressure should be consistently less than 140/90.  3. Recent Back Surgery, Managed on Gabapentin - Encouraged patient to continue following up with specialists ([name], [name]) as established for any orthopedic concerns.  - Treatment plan and follow-up will continue to be managed by specialists as established.  4. Stressful Life Event - Moving Home with Wife - Encouraged patient to engage in daily physical activity to help alleviate stress.  - Reviewed the "spokes of the wheel" of mood and health management.  Stressed the importance of ongoing prudent habits, including regular exercise, appropriate sleep hygiene, healthful  dietary habits, and prayer/meditation to relax.  5. Lifestyle & Preventative Health Maintenance, Unspecified Fatigue - Advised patient to continue working toward exercising to improve fatigue, and overall mental, physical, and emotional health.    - Encouraged patient to engage in daily physical activity, especially a formal exercise routine.  Recommended that the patient eventually strive for at least 150 minutes of moderate cardiovascular activity per week according to guidelines established by the [facility].   - Prudent dietary habits encouraged, including low-carb, and high amounts of lean protein in diet.   - Health counseling performed.  All questions answered.  - Patient should also consume adequate amounts of water.   Education and routine counseling performed. Handouts provided.   Orders Placed This Encounter  Procedures  . POCT glycosylated hemoglobin (Hb A1C)  . HM Diabetes Foot Exam    There are no discontinued medications.    No orders of the defined types were placed in this encounter.   The patient was counseled, risk factors were discussed, anticipatory guidance given.  Gross side effects, risk and benefits, and alternatives of medications discussed with patient.  Patient is aware that all medications have potential side effects and we are unable to predict every side effect or drug-drug interaction that may occur.  Expresses verbal understanding and consents to current therapy plan and treatment regimen.   Return for near future for Medicare wellness with [facility] .   Please see AVS handed out to patient at the end of our visit for further patient instructions/ counseling done pertaining to today's office visit.    Note:  This document was prepared using Systems analyst and  may include unintentional dictation errors.   This document serves as a record of services personally performed by Mellody Dance, DO. It was created on her behalf by [name], a trained medical scribe. The creation of this record is based on the scribe's personal observations and the provider's statements to them.   I have reviewed the above medical documentation for accuracy and completeness and I concur.  Mellody Dance, DO 04/12/2018 10:09 PM       Subjective:    Chief Complaint  Patient presents with  . Follow-up    Kyle Simon is a 85 y.o. male who presents to [facility] today for Diabetes Management.    Recent Back Surgery Had back surgery with [name].  Patient was placed on gabapentin for symptoms in his "nerve endings."  Patient states his nerve endings were compressed.  The surgery and gabapentin have both helped him.  Patient states that he's looking forward to flying again, but cannot fly while on gabapentin.  DM HPI: -  He has been working on diet and exercise for diabetes.  Not doing much exercise given his move, but did purchase a stationary bike for christmas.  Pt is currently maintained on the following medications for diabetes: diet-controlled. Medication compliance - continues on diet-controlled management.  Feels his sugars have been okay, but recently lost all of his equipment in his move.  They've been out of their house for 2 weeks and living with their daughter.  They are moving to [place].  Home glucose readings range: Blood sugar was running 95-105 before his move.   Denies polyuria/polydipsia. Denies hypo/ hyperglycemia symptoms - He denies new onset of: chest pain, exercise intolerance, shortness of breath, dizziness, visual changes, headache, lower extremity swelling or claudication.   Last diabetic eye exam was  Lab Results  Component Value Date   HMDIABEYEEXA No Retinopathy 02/07/2018    Foot exam- UTD  Last A1C in the office was:  Lab Results  Component Value Date   HGBA1C 6.4 (A) 04/12/2018   HGBA1C 6.2 (H) 10/25/2017   HGBA1C 6.1 (A) 08/22/2017    Lab  Results  Component Value Date   MICROALBUR 10 04/14/2017   LDLCALC 98 09/15/2016   CREATININE 0.95 10/25/2017    1. HTN HPI:  -  His blood pressure has been controlled at home.  Pt is checking it at home.  States that his blood pressure was running around 140's at home prior to his move.  Patient states that he knows his blood pressure machine runs high, and that 140-145's at home correlate to 120's-130's here.  States that his heart rate at home can sometimes be as low as 49, usually between 50-60.  Overall feels well.  Had a recent stress test, but passed it.  - Patient reports good compliance with blood pressure medications  - Denies medication S-E   - Smoking Status noted   - He denies new onset of: chest pain, exercise intolerance, shortness of breath, dizziness, visual changes, headache, lower extremity swelling or claudication.   Last 3 blood pressure readings in our office are as follows: BP Readings from Last 3 Encounters:  04/12/18 120/72  11/04/17 (!) 129/56  10/25/17 (!) 150/69    Filed Weights   04/12/18 1556  Weight: 193 lb (87.5 kg)    Last 3 blood pressure readings in our office are as follows: BP Readings from Last 3 Encounters:  04/12/18 120/72  11/04/17 (!) 129/56  10/25/17 (!) 150/69    BMI Readings from Last 3 Encounters:  04/12/18 26.92 kg/m  10/25/17 27.13 kg/m  10/07/17 26.78 kg/m     No problems updated.    Patient Care Team    Relationship Specialty Notifications Start End  Mellody Dance, DO PCP - General Family Medicine  08/05/15   Rana Snare, MD Consulting Physician Urology  11/10/15   Lavonna Monarch, MD Consulting Physician Dermatology  7/74/12   Jodi Marble, MD Consulting Physician Otolaryngology  11/10/15   Calton Dach, MD Referring Physician Optometry  11/10/15   Inocencio Homes, [place] Consulting Physician Podiatry  11/10/15   Bo Merino, MD Consulting Physician Rheumatology  11/10/15   Vickey Huger, MD  Consulting Physician Orthopedic Surgery  11/10/15   Erline Levine, MD Consulting Physician Neurosurgery  11/10/15   Deboraha Sprang, MD Consulting Physician Cardiology  01/04/16    Comment: seen '12 last  Elayne Snare, MD Consulting Physician Endocrinology  04/14/17   Kathrynn Ducking, MD Consulting Physician Neurology  8/78/67   Jodi Marble, MD Consulting Physician Otolaryngology  04/22/17      Patient Active Problem List   Diagnosis Date Noted  . Stressful life event affecting family-loss on May 31st 08/22/2017    Priority: High  . DM assoc with CKD (chronic kidney disease), stage III ([place]) 04/14/2017    Priority: High  . Diabetes mellitus without complication ([place]) 67/20/9470    Priority: High  . Hypertension associated with diabetes ([place]) 03/20/2014    Priority: High  . Hyperlipidemia associated with type 2 diabetes mellitus ([place]) 04/19/2007    Priority: High  . H/O total knee replacement, bilateral 11/22/2016    Priority: Medium  . High risk medications (not anticoagulants) long-term use 01/04/2016    Priority: Medium  . Hypomagnesemia 12/29/2015    Priority: Medium  . h/o Hyponatremia 12/29/2015    Priority: Medium  . BPH (benign prostatic hyperplasia) 04/18/2007    Priority: Medium  . Hypothyroidism 04/20/2006    Priority: Medium  . Age-related osteoporosis without current pathological fracture 07/16/2016    Priority: Low  . Flatulence symptom 07/25/2015    Priority: Low  . Diarrhea 06/21/2015    Priority: Low  . B12 deficiency anemia 01/22/2013    Priority: Low  . Anemia, unspecified 08/13/2012    Priority: Low  . GERD (gastroesophageal reflux disease) 03/26/2011    Priority: Low  . SOB (shortness of breath) 10/05/2017  . Aortic atherosclerosis ([place]) 10/05/2017  . Fatigue 08/22/2017  . Chronic pain of right knee 08/22/2017  . Inactivity-recently due to knee pain 08/22/2017  . Lumbar radiculopathy 06/16/2017  . Total knee replacement status, right 04/14/2017    . Arthralgia of right temporomandibular joint 03/02/2017  . Chronic eczematous otitis externa of both ears 03/02/2017  . Presbycusis of both ears 03/02/2017  . ETD (Eustachian tube dysfunction), right 09/15/2016  . Strain of masseter muscle 09/15/2016  . Trochanteric bursitis of right hip 09/07/2016  . History of lumbar fusion 07/16/2016  . History of total knee arthroplasty, left 07/16/2016  . Cough 01/04/2016  . Cerumen impaction 08/25/2015  . Chronic rhinitis 11/01/2014  . Spondylolisthesis of lumbar region 10/23/2013  . Diverticulosis of colon without hemorrhage 01/22/2013  . Synovial cyst of lumbar spine 07/26/2011  . Spasms of the hands or feet 07/26/2011  . SENILE KERATOSIS 06/12/2008  . SNORING, HX OF 04/19/2007     Past Medical History:  Diagnosis Date  . Arthritis    both hands;Dr.Deveshwar  .  Constipation due to pain medication   . Diabetes mellitus without complication ([place])    Type II. Uses no medication. Pt controls with diet and weight  . Enlarged prostate   . Frequent urination at night   . GERD (gastroesophageal reflux disease)   . Hyperlipidemia   . Hypertension   . Hypothyroidism   . Polio    as a child w/o complications  . Snoring    no sleep apnea  . Torn meniscus   . Transfusion history 2012   post TKR     Past Surgical History:  Procedure Laterality Date  . ANTERIOR LAT LUMBAR FUSION Right 11/03/2017   Procedure: Right Lumbar three-four Anterolateral lumbar interbody fusion with lateral plate;  Surgeon: Erline Levine, MD;  Location: [facility];  Service: Neurosurgery;  Laterality: Right;  . BACK SURGERY  2011   hemiarthroplasty 01/11/1999 and 6  . CARDIAC CATHETERIZATION  09/2001   negative  . COLONOSCOPY    . EYE SURGERY  2003   R&L cataract removed & IOL- 2003 & 2007,Dr.Jenkins  . INGUINAL HERNIA REPAIR  2002   right, Dr. Truitt Leep  . JOINT REPLACEMENT  2012   left knee replacement  . LIPOMA EXCISION     back  . MENISCECTOMY Bilateral     Dr. Noemi Chapel  . PROSTATE SURGERY  07/2006   for urinary urgency, Dr. Risa Grill  . ROTATOR CUFF REPAIR Right October, 2016  . sinus & throat surgery-1995  1995  . TONSILLECTOMY    . TOTAL KNEE ARTHROPLASTY  01/11/2011   Procedure: TOTAL KNEE ARTHROPLASTY;  Surgeon: Rudean Haskell, MD;  Location: [facility];  Service: Orthopedics;  Laterality: Left;  . TOTAL KNEE ARTHROPLASTY Right 11/22/2016   Procedure: TOTAL KNEE ARTHROPLASTY;  Surgeon: Vickey Huger, MD;  Location: [facility];  Service: Orthopedics;  Laterality: Right;  . UVULECTOMY  1610   Dr. Erik Obey  . VASECTOMY    . WRIST SURGERY  1959   fracture- right     Family History  Problem Relation Age of Onset  . Sudden death Father        MVA  . Coronary artery disease Mother   . Emphysema Mother   . Diverticulosis Mother   . Thyroid disease Mother   . Asthma Brother   . Bone cancer Brother   . Diabetes Paternal Grandmother   . Sarcoidosis Son   . Thyroid disease Daughter   . Anesthesia problems Neg Hx   . Hypotension Neg Hx   . Malignant hyperthermia Neg Hx   . Pseudochol deficiency Neg Hx   . Colon cancer Neg Hx      Social History   Substance and Sexual Activity  Drug Use No  ,  Social History   Substance and Sexual Activity  Alcohol Use Yes  . Alcohol/week: 4.0 standard drinks  . Types: 4 Shots of liquor per week   Comment: social, few drinks a week  ,  Social History   Tobacco Use  Smoking Status Former Smoker  . Packs/day: 1.00  . Years: 18.00  . Pack years: 18.00  . Last attempt to quit: 03/01/1966  . Years since quitting: 52.1  Smokeless Tobacco Never Used  ,    Current Outpatient Medications on File Prior to Visit  Medication Sig Dispense Refill  . acetaminophen (TYLENOL) 500 MG tablet Take 1,000 mg by mouth every 6 (six) hours as needed for mild pain.     Marland Kitchen aspirin 81 MG tablet Take 81 mg by mouth  every evening.     . Calcium Carb-Cholecalciferol (CALCIUM 600 + D PO) Take 1 tablet by mouth daily.     Marland Kitchen  CALCIUM-MAGNESIUM-ZINC PO Take 1 tablet by mouth daily.    Marland Kitchen gabapentin (NEURONTIN) 100 MG capsule Take 1 capsule by mouth 3 (three) times daily.    Marland Kitchen glucose blood (PRECISION XTRA TEST STRIPS) test strip USE TO CHECK BLOOD SUGAR ONCE DAILY DX E11.9 100 each 2  . Hypromellose (ARTIFICIAL TEARS OP) Place 1 drop into both eyes 2 (two) times daily.    Marland Kitchen LANCETS ULTRA THIN 30G MISC Patient is to use to check blood glucose in the AM fasting and 2 hours after largest meal. 100 each 6  . losartan (COZAAR) 50 MG tablet Take 1 tablet (50 mg total) by mouth daily. 90 tablet 1  . methocarbamol (ROBAXIN) 500 MG tablet Take 1 tablet (500 mg total) by mouth every 6 (six) hours as needed for muscle spasms. 60 tablet 1  . Multiple Vitamin (MULTIVITAMIN WITH MINERALS) TABS Take 1 tablet by mouth daily.    . NONFORMULARY OR COMPOUNDED ITEM Glucometer (Precision XTRA- Model #[id number]) 1 each 0  . Omega-3 Fatty Acids (FISH OIL) 1000 MG CAPS Take 1,000 mg by mouth every evening.    . Potassium 99 MG TABS Take 99 mg by mouth daily.    . Probiotic Product (RESTORA) CAPS One tab qd (Patient taking differently: Take 1 capsule by mouth every evening. ) 90 capsule 3  . SYNTHROID 150 MCG tablet TAKE 1 TABLET DAILY BEFORE BREAKFAST 90 tablet 4  . Tamsulosin HCl (FLOMAX) 0.4 MG CAPS Take 0.4 mg by mouth 2 (two) times daily.     . traMADol (ULTRAM) 50 MG tablet Take 1 tablet (50 mg total) by mouth every 6 (six) hours as needed for moderate pain. 60 tablet 1  . vitamin B-12 (CYANOCOBALAMIN) 1000 MCG tablet Take 1,000 mcg by mouth daily.     No current facility-administered medications on file prior to visit.      Allergies  Allergen Reactions  . Simvastatin Other (See Comments)    MYALGIAS  . Oxycodone Other (See Comments)    Severe constipation     Review of Systems:   General:  Denies fever, chills Optho/Auditory:   Denies visual changes, blurred vision Respiratory:   Denies SOB, cough, wheeze, DIB    Cardiovascular:   Denies chest pain, palpitations, painful respirations Gastrointestinal:   Denies nausea, vomiting, diarrhea.  Endocrine:     Denies new hot or cold intolerance Musculoskeletal:  Denies joint swelling, gait issues, or new unexplained myalgias/ arthralgias Skin:  Denies rash, suspicious lesions  Neurological:    Denies dizziness, unexplained weakness, numbness  Psychiatric/Behavioral:   Denies mood changes    Objective:     Blood pressure 120/72, pulse (!) 54, temperature 98.2 F (36.8 C), height 5\' 11"  (1.803 m), weight 193 lb (87.5 kg), SpO2 100 %.  Body mass index is 26.92 kg/m.  General: Well Developed, well nourished, and in no acute distress.  HEENT: Normocephalic, atraumatic, pupils equal round reactive to light, neck supple, No carotid bruits, no JVD Skin: Warm and dry, cap RF less 2 sec Cardiac: Regular rate and rhythm, S1, S2 WNL's, no murmurs rubs or gallops Respiratory: ECTA B/L, Not using accessory muscles, speaking in full sentences. NeuroM-Sk: Ambulates w/o assistance, moves ext * 4 w/o difficulty, sensation grossly intact.  Ext: scant edema b/l lower ext Psych: No HI/SI, judgement and insight good, Euthymic mood. Full  Affect.

## 2018-04-12 NOTE — Patient Instructions (Signed)

## 2018-04-27 NOTE — Progress Notes (Deleted)
Office Visit Note  Patient: Kyle Simon             Date of Birth: 1932-06-22           MRN: [id number]             PCP: Thomasene Lot, DO Referring: Thomasene Lot, DO Visit Date: 05/11/2018 Occupation: @GUAROCC @  Subjective:  No chief complaint on file.   History of Present Illness: Kyle Simon is a 85 y.o. male ***   Activities of Daily Living:  Patient reports morning stiffness for *** {minute/hour:19697}.   Patient {ACTIONS;DENIES/REPORTS:21021675::"Denies"} nocturnal pain.  Difficulty dressing/grooming: {ACTIONS;DENIES/REPORTS:21021675::"Denies"} Difficulty climbing stairs: {ACTIONS;DENIES/REPORTS:21021675::"Denies"} Difficulty getting out of chair: {ACTIONS;DENIES/REPORTS:21021675::"Denies"} Difficulty using hands for taps, buttons, cutlery, and/or writing: {ACTIONS;DENIES/REPORTS:21021675::"Denies"}  No Rheumatology ROS completed.   PMFS History:  Patient Active Problem List   Diagnosis Date Noted  . SOB (shortness of breath) 10/05/2017  . Aortic atherosclerosis (HCC) 10/05/2017  . Fatigue 08/22/2017  . Chronic pain of right knee 08/22/2017  . Stressful life event affecting family-loss on May 31st 08/22/2017  . Inactivity-recently due to knee pain 08/22/2017  . Lumbar radiculopathy 06/16/2017  . Total knee replacement status, right 04/14/2017  . DM assoc with CKD (chronic kidney disease), stage III (HCC) 04/14/2017  . Arthralgia of right temporomandibular joint 03/02/2017  . Chronic eczematous otitis externa of both ears 03/02/2017  . Presbycusis of both ears 03/02/2017  . H/O total knee replacement, bilateral 11/22/2016  . ETD (Eustachian tube dysfunction), right 09/15/2016  . Strain of masseter muscle 09/15/2016  . Trochanteric bursitis of right hip 09/07/2016  . Age-related osteoporosis without current pathological fracture 07/16/2016  . History of lumbar fusion 07/16/2016  . History of total knee arthroplasty, left 07/16/2016  . Cough  01/04/2016  . High risk medications (not anticoagulants) long-term use 01/04/2016  . Hypomagnesemia 12/29/2015  . h/o Hyponatremia 12/29/2015  . Cerumen impaction 08/25/2015  . Diabetes mellitus without complication (HCC) 08/05/2015  . Flatulence symptom 07/25/2015  . Diarrhea 06/21/2015  . Chronic rhinitis 11/01/2014  . Hypertension associated with diabetes (HCC) 03/20/2014  . Spondylolisthesis of lumbar region 10/23/2013  . Diverticulosis of colon without hemorrhage 01/22/2013  . B12 deficiency anemia 01/22/2013  . Anemia, unspecified 08/13/2012  . Synovial cyst of lumbar spine 07/26/2011  . Spasms of the hands or feet 07/26/2011  . GERD (gastroesophageal reflux disease) 03/26/2011  . SENILE KERATOSIS 06/12/2008  . Hyperlipidemia associated with type 2 diabetes mellitus (HCC) 04/19/2007  . SNORING, HX OF 04/19/2007  . BPH (benign prostatic hyperplasia) 04/18/2007  . Hypothyroidism 04/20/2006    Past Medical History:  Diagnosis Date  . Arthritis    both hands;Dr.Deveshwar  . Constipation due to pain medication   . Diabetes mellitus without complication (HCC)    Type II. Uses no medication. Pt controls with diet and weight  . Enlarged prostate   . Frequent urination at night   . GERD (gastroesophageal reflux disease)   . Hyperlipidemia   . Hypertension   . Hypothyroidism   . Polio    as a child w/o complications  . Snoring    no sleep apnea  . Torn meniscus   . Transfusion history 2012   post TKR    Family History  Problem Relation Age of Onset  . Sudden death Father        MVA  . Coronary artery disease Mother   . Emphysema Mother   . Diverticulosis Mother   . Thyroid disease Mother   .  Asthma Brother   . Bone cancer Brother   . Diabetes Paternal Grandmother   . Sarcoidosis Son   . Thyroid disease Daughter   . Anesthesia problems Neg Hx   . Hypotension Neg Hx   . Malignant hyperthermia Neg Hx   . Pseudochol deficiency Neg Hx   . Colon cancer Neg Hx     Past Surgical History:  Procedure Laterality Date  . ANTERIOR LAT LUMBAR FUSION Right 11/03/2017   Procedure: Right Lumbar three-four Anterolateral lumbar interbody fusion with lateral plate;  Surgeon: Maeola Harman, MD;  Location: [facility] OR;  Service: Neurosurgery;  Laterality: Right;  . BACK SURGERY  2011   hemiarthroplasty 01/11/1999 and 6  . CARDIAC CATHETERIZATION  09/2001   negative  . COLONOSCOPY    . EYE SURGERY  2003   R&L cataract removed & IOL- 2003 & 2007,Dr.Jenkins  . INGUINAL HERNIA REPAIR  2002   right, Dr. Luan Pulling  . JOINT REPLACEMENT  2012   left knee replacement  . LIPOMA EXCISION     back  . MENISCECTOMY Bilateral    Dr. Thurston Hole  . PROSTATE SURGERY  07/2006   for urinary urgency, Dr. Isabel Caprice  . ROTATOR CUFF REPAIR Right October, 2016  . sinus & throat surgery-1995  1995  . TONSILLECTOMY    . TOTAL KNEE ARTHROPLASTY  01/11/2011   Procedure: TOTAL KNEE ARTHROPLASTY;  Surgeon: Raymon Mutton, MD;  Location: [facility] OR;  Service: Orthopedics;  Laterality: Left;  . TOTAL KNEE ARTHROPLASTY Right 11/22/2016   Procedure: TOTAL KNEE ARTHROPLASTY;  Surgeon: Dannielle Huh, MD;  Location: [facility] OR;  Service: Orthopedics;  Laterality: Right;  . UVULECTOMY  1996   Dr. Lazarus Salines  . VASECTOMY    . WRIST SURGERY  1959   fracture- right   Social History   Social History Narrative  . Not on file   Immunization History  Administered Date(s) Administered  . Influenza Whole 01/09/2002  . Influenza,inj,Quad PF,6+ Mos 11/29/2012, 11/29/2013  . Influenza-Unspecified 11/30/2014, 11/22/2015, 01/12/2018  . MMR 07/12/2017  . Pneumococcal Conjugate-13 03/20/2014  . Pneumococcal Polysaccharide-23 03/30/1996, 02/13/2010  . Td 03/30/1996, 04/26/2007     Objective: Vital Signs: There were no vitals taken for this visit.   Physical Exam   Musculoskeletal Exam: ***  CDAI Exam: CDAI Score: Not documented Patient Global Assessment: Not documented; Provider Global Assessment: Not  documented Swollen: Not documented; Tender: Not documented Joint Exam   Not documented   There is currently no information documented on the homunculus. Go to the Rheumatology activity and complete the homunculus joint exam.  Investigation: No additional findings.  Imaging: No results found.  Recent Labs: Lab Results  Component Value Date   WBC 5.3 10/25/2017   HGB 11.2 (L) 11/03/2017   PLT 224 10/25/2017   NA 134 (L) 11/03/2017   K 4.3 11/03/2017   CL 96 (L) 10/25/2017   CO2 24 10/25/2017   GLUCOSE 101 (H) 11/03/2017   BUN 12 10/25/2017   CREATININE 0.95 10/25/2017   BILITOT 0.4 08/22/2017   ALKPHOS 79 08/22/2017   AST 19 08/22/2017   ALT 19 08/22/2017   PROT 6.4 08/22/2017   ALBUMIN 4.2 08/22/2017   CALCIUM 9.3 10/25/2017   GFRAA >60 10/25/2017    Speciality Comments: No specialty comments available.  Procedures:  No procedures performed Allergies: Simvastatin and Oxycodone   Assessment / Plan:     Visit Diagnoses: No diagnosis found.   Orders: No orders of the defined types were placed in this encounter.  No orders of the defined types were placed in this encounter.   Face-to-face time spent with patient was *** minutes. Greater than 50% of time was spent in counseling and coordination of care.  Follow-Up Instructions: No follow-ups on file.   Ellen Henri, CMA  Note - This record has been created using Animal nutritionist.  Chart creation errors have been sought, but may not always  have been located. Such creation errors do not reflect on  the standard of medical care.

## 2018-04-28 DIAGNOSIS — L84 Corns and callosities: Secondary | ICD-10-CM | POA: Diagnosis not present

## 2018-04-28 DIAGNOSIS — E1151 Type 2 diabetes mellitus with diabetic peripheral angiopathy without gangrene: Secondary | ICD-10-CM | POA: Diagnosis not present

## 2018-04-28 DIAGNOSIS — L603 Nail dystrophy: Secondary | ICD-10-CM | POA: Diagnosis not present

## 2018-04-28 DIAGNOSIS — I739 Peripheral vascular disease, unspecified: Secondary | ICD-10-CM | POA: Diagnosis not present

## 2018-05-01 DIAGNOSIS — M48062 Spinal stenosis, lumbar region with neurogenic claudication: Secondary | ICD-10-CM | POA: Diagnosis not present

## 2018-05-01 DIAGNOSIS — M545 Low back pain: Secondary | ICD-10-CM | POA: Diagnosis not present

## 2018-05-01 DIAGNOSIS — Z681 Body mass index (BMI) 19 or less, adult: Secondary | ICD-10-CM | POA: Diagnosis not present

## 2018-05-01 DIAGNOSIS — M4310 Spondylolisthesis, site unspecified: Secondary | ICD-10-CM | POA: Diagnosis not present

## 2018-05-01 DIAGNOSIS — M5416 Radiculopathy, lumbar region: Secondary | ICD-10-CM | POA: Diagnosis not present

## 2018-05-01 DIAGNOSIS — M7138 Other bursal cyst, other site: Secondary | ICD-10-CM | POA: Diagnosis not present

## 2018-05-09 ENCOUNTER — Ambulatory Visit (INDEPENDENT_AMBULATORY_CARE_PROVIDER_SITE_OTHER): Payer: Medicare Other | Admitting: Rheumatology

## 2018-05-09 ENCOUNTER — Encounter: Payer: Self-pay | Admitting: Physician Assistant

## 2018-05-09 VITALS — BP 111/70 | HR 56 | Resp 15 | Ht 68.5 in | Wt 193.0 lb

## 2018-05-09 DIAGNOSIS — Z8679 Personal history of other diseases of the circulatory system: Secondary | ICD-10-CM

## 2018-05-09 DIAGNOSIS — Z8719 Personal history of other diseases of the digestive system: Secondary | ICD-10-CM

## 2018-05-09 DIAGNOSIS — M4316 Spondylolisthesis, lumbar region: Secondary | ICD-10-CM | POA: Diagnosis not present

## 2018-05-09 DIAGNOSIS — Z96653 Presence of artificial knee joint, bilateral: Secondary | ICD-10-CM | POA: Diagnosis not present

## 2018-05-09 DIAGNOSIS — Z862 Personal history of diseases of the blood and blood-forming organs and certain disorders involving the immune mechanism: Secondary | ICD-10-CM | POA: Diagnosis not present

## 2018-05-09 DIAGNOSIS — Z8639 Personal history of other endocrine, nutritional and metabolic disease: Secondary | ICD-10-CM

## 2018-05-09 DIAGNOSIS — M810 Age-related osteoporosis without current pathological fracture: Secondary | ICD-10-CM | POA: Diagnosis not present

## 2018-05-09 NOTE — Progress Notes (Signed)
Office Visit Note  Patient: Kyle Simon             Date of Birth: 1932/06/03           MRN: [id number]             PCP: Thomasene Lot, DO Referring: Thomasene Lot, DO Visit Date: 05/09/2018 Occupation: @GUAROCC @  Subjective:  Osteoporosis.   History of Present Illness: Kyle Simon is a 85 y.o. male with history of osteoporosis, lumbar spine DDD and osteoarthritis. Patient reports he has usual knee joint stiffness in the morning which improves with movement. He states his back is doing much better after his surgery on 10/2017 and his knee pain has greatly improved and has minimal discomfort. Denies other joint pain or discomfort. Patient continues to take his calcium and vitamin D supplements and is agreeable to obtaining a bone density scan. He denies any joint swelling.      Activities of Daily Living:  Patient reports morning stiffness for 30 minutes.   Patient Denies nocturnal pain.  Difficulty dressing/grooming: Denies Difficulty climbing stairs: Denies Difficulty getting out of chair: Denies Difficulty using hands for taps, buttons, cutlery, and/or writing: Denies  Review of Systems  Constitutional: Negative for fatigue.  HENT: Negative for mouth sores, mouth dryness and nose dryness.   Eyes: Negative for pain and itching.  Respiratory: Negative for shortness of breath, wheezing and difficulty breathing.   Cardiovascular: Negative for chest pain and hypertension.  Gastrointestinal: Negative for abdominal pain, constipation and diarrhea.  Endocrine: Negative for increased urination.  Genitourinary: Negative for painful urination and pelvic pain.  Musculoskeletal: Positive for arthralgias, joint pain and morning stiffness. Negative for joint swelling.  Skin: Negative for rash and redness.  Allergic/Immunologic: Negative for susceptible to infections.  Neurological: Negative for dizziness, light-headedness, headaches, memory loss and weakness.  Hematological:  Positive for bruising/bleeding tendency.  Psychiatric/Behavioral: Negative for confusion and sleep disturbance. The patient is not nervous/anxious.     PMFS History:  Patient Active Problem List   Diagnosis Date Noted  . SOB (shortness of breath) 10/05/2017  . Aortic atherosclerosis (HCC) 10/05/2017  . Fatigue 08/22/2017  . Chronic pain of right knee 08/22/2017  . Stressful life event affecting family-loss on May 31st 08/22/2017  . Inactivity-recently due to knee pain 08/22/2017  . Lumbar radiculopathy 06/16/2017  . Total knee replacement status, right 04/14/2017  . DM assoc with CKD (chronic kidney disease), stage III (HCC) 04/14/2017  . Arthralgia of right temporomandibular joint 03/02/2017  . Chronic eczematous otitis externa of both ears 03/02/2017  . Presbycusis of both ears 03/02/2017  . H/O total knee replacement, bilateral 11/22/2016  . ETD (Eustachian tube dysfunction), right 09/15/2016  . Strain of masseter muscle 09/15/2016  . Trochanteric bursitis of right hip 09/07/2016  . Age-related osteoporosis without current pathological fracture 07/16/2016  . History of lumbar fusion 07/16/2016  . History of total knee arthroplasty, left 07/16/2016  . Cough 01/04/2016  . High risk medications (not anticoagulants) long-term use 01/04/2016  . Hypomagnesemia 12/29/2015  . h/o Hyponatremia 12/29/2015  . Cerumen impaction 08/25/2015  . Diabetes mellitus without complication (HCC) 08/05/2015  . Flatulence symptom 07/25/2015  . Diarrhea 06/21/2015  . Chronic rhinitis 11/01/2014  . Hypertension associated with diabetes (HCC) 03/20/2014  . Spondylolisthesis of lumbar region 10/23/2013  . Diverticulosis of colon without hemorrhage 01/22/2013  . B12 deficiency anemia 01/22/2013  . Anemia, unspecified 08/13/2012  . Synovial cyst of lumbar spine 07/26/2011  . Spasms of the  hands or feet 07/26/2011  . GERD (gastroesophageal reflux disease) 03/26/2011  . SENILE KERATOSIS 06/12/2008  .  Hyperlipidemia associated with type 2 diabetes mellitus (HCC) 04/19/2007  . SNORING, HX OF 04/19/2007  . BPH (benign prostatic hyperplasia) 04/18/2007  . Hypothyroidism 04/20/2006    Past Medical History:  Diagnosis Date  . Arthritis    both hands;Dr.Eylin Pontarelli  . Constipation due to pain medication   . Diabetes mellitus without complication (HCC)    Type II. Uses no medication. Pt controls with diet and weight  . Enlarged prostate   . Frequent urination at night   . GERD (gastroesophageal reflux disease)   . Hyperlipidemia   . Hypertension   . Hypothyroidism   . Polio    as a child w/o complications  . Snoring    no sleep apnea  . Torn meniscus   . Transfusion history 2012   post TKR    Family History  Problem Relation Age of Onset  . Sudden death Father        MVA  . Coronary artery disease Mother   . Emphysema Mother   . Diverticulosis Mother   . Thyroid disease Mother   . Asthma Brother   . Bone cancer Brother   . Diabetes Paternal Grandmother   . Sarcoidosis Son   . Thyroid disease Daughter   . Anesthesia problems Neg Hx   . Hypotension Neg Hx   . Malignant hyperthermia Neg Hx   . Pseudochol deficiency Neg Hx   . Colon cancer Neg Hx    Past Surgical History:  Procedure Laterality Date  . ANTERIOR LAT LUMBAR FUSION Right 11/03/2017   Procedure: Right Lumbar three-four Anterolateral lumbar interbody fusion with lateral plate;  Surgeon: Maeola Harman, MD;  Location: [facility] OR;  Service: Neurosurgery;  Laterality: Right;  . BACK SURGERY  2011   hemiarthroplasty 01/11/1999 and 6  . CARDIAC CATHETERIZATION  09/2001   negative  . COLONOSCOPY    . EYE SURGERY  2003   R&L cataract removed & IOL- 2003 & 2007,Dr.Jenkins  . INGUINAL HERNIA REPAIR  2002   right, Dr. Luan Pulling  . JOINT REPLACEMENT  2012   left knee replacement  . LIPOMA EXCISION     back  . MENISCECTOMY Bilateral    Dr. Thurston Hole  . PROSTATE SURGERY  07/2006   for urinary urgency, Dr. Isabel Caprice  . ROTATOR  CUFF REPAIR Right October, 2016  . sinus & throat surgery-1995  1995  . TONSILLECTOMY    . TOTAL KNEE ARTHROPLASTY  01/11/2011   Procedure: TOTAL KNEE ARTHROPLASTY;  Surgeon: Raymon Mutton, MD;  Location: [facility] OR;  Service: Orthopedics;  Laterality: Left;  . TOTAL KNEE ARTHROPLASTY Right 11/22/2016   Procedure: TOTAL KNEE ARTHROPLASTY;  Surgeon: Dannielle Huh, MD;  Location: [facility] OR;  Service: Orthopedics;  Laterality: Right;  . UVULECTOMY  1996   Dr. Lazarus Salines  . VASECTOMY    . WRIST SURGERY  1959   fracture- right   Social History   Social History Narrative  . Not on file   Immunization History  Administered Date(s) Administered  . Influenza Whole 01/09/2002  . Influenza,inj,Quad PF,6+ Mos 11/29/2012, 11/29/2013  . Influenza-Unspecified 11/30/2014, 11/22/2015, 01/12/2018  . MMR 07/12/2017  . Pneumococcal Conjugate-13 03/20/2014  . Pneumococcal Polysaccharide-23 03/30/1996, 02/13/2010  . Td 03/30/1996, 04/26/2007     Objective: Vital Signs: BP 111/70 (BP Location: Left Arm, Patient Position: Sitting, Cuff Size: Normal)   Pulse (!) 56   Resp 15   Ht  5' 8.5" (1.74 m)   Wt 193 lb (87.5 kg)   BMI 28.92 kg/m    Physical Exam Vitals signs and nursing note reviewed.  Constitutional:      Appearance: He is well-developed.  HENT:     Head: Normocephalic and atraumatic.  Eyes:     Conjunctiva/sclera: Conjunctivae normal.     Pupils: Pupils are equal, round, and reactive to light.  Neck:     Musculoskeletal: Normal range of motion and neck supple.  Cardiovascular:     Rate and Rhythm: Normal rate and regular rhythm.     Heart sounds: Normal heart sounds.  Pulmonary:     Effort: Pulmonary effort is normal.     Breath sounds: Normal breath sounds.  Abdominal:     General: Bowel sounds are normal.     Palpations: Abdomen is soft.  Skin:    General: Skin is warm and dry.     Capillary Refill: Capillary refill takes less than 2 seconds.  Neurological:     Mental Status: He is  alert and oriented to person, place, and time.  Psychiatric:        Behavior: Behavior normal.      Musculoskeletal Exam: C-spine limited range of motion.  He has thoracic kyphosis.  He has limited range of lumbar spine.. Shoulder joints, elbow joints, wrist joints in good range of motion. PIP and DIP synovial thickening noted in bilateral hands with no synovitis consistent with osteoarthritis. Hip joints in good range of motion. Patient has bilateral total knee replacement with some warmth and no joint pain or discomfort.   CDAI Exam: CDAI Score: Not documented Patient Global Assessment: Not documented; Provider Global Assessment: Not documented Swollen: Not documented; Tender: Not documented Joint Exam   Not documented   There is currently no information documented on the homunculus. Go to the Rheumatology activity and complete the homunculus joint exam.  Investigation: No additional findings.  Imaging: No results found.  Recent Labs: Lab Results  Component Value Date   WBC 5.3 10/25/2017   HGB 11.2 (L) 11/03/2017   PLT 224 10/25/2017   NA 134 (L) 11/03/2017   K 4.3 11/03/2017   CL 96 (L) 10/25/2017   CO2 24 10/25/2017   GLUCOSE 101 (H) 11/03/2017   BUN 12 10/25/2017   CREATININE 0.95 10/25/2017   BILITOT 0.4 08/22/2017   ALKPHOS 79 08/22/2017   AST 19 08/22/2017   ALT 19 08/22/2017   PROT 6.4 08/22/2017   ALBUMIN 4.2 08/22/2017   CALCIUM 9.3 10/25/2017   GFRAA >60 10/25/2017    Speciality Comments: No specialty comments available.  Procedures:  No procedures performed Allergies: Simvastatin and Oxycodone   Assessment / Plan:     Visit Diagnoses: Age-related osteoporosis without current pathological fracture - 05/27/2015 DEXA T score -2.5 right radius.  Patient has been taking calcium and vitamin D.  We will schedule bone density.  Spondylolisthesis of lumbar region - Status post lumbar fusion by Dr. Venetia Maxon 10/2017  H/O total knee replacement, bilateral -  Right total knee replacement 11/22/2016 by Dr. Valentina Gu, left total knee replacement 2012 by Dr. Valentina Gu.  He still continues to have some discomfort in his knee joints.  He has not started any exercises yet.  History of anemia  History of gastroesophageal reflux (GERD)  History of hyperlipidemia  History of diabetes mellitus  History of hypertension -blood pressure was well-controlled today.  Orders: Orders Placed This Encounter  Procedures  . DG BONE DENSITY (DXA)  No orders of the defined types were placed in this encounter.    Follow-Up Instructions: Return in about 6 months (around 11/09/2018) for Osteoarthritis, Osteoporosis.   Pollyann Savoy, MD  Note - This record has been created using Animal nutritionist.  Chart creation errors have been sought, but may not always  have been located. Such creation errors do not reflect on  the standard of medical care.

## 2018-05-11 ENCOUNTER — Ambulatory Visit: Payer: Medicare Other | Admitting: Rheumatology

## 2018-06-03 ENCOUNTER — Other Ambulatory Visit: Payer: Self-pay | Admitting: Family Medicine

## 2018-06-03 DIAGNOSIS — I152 Hypertension secondary to endocrine disorders: Secondary | ICD-10-CM

## 2018-06-03 DIAGNOSIS — I10 Essential (primary) hypertension: Principal | ICD-10-CM

## 2018-06-03 DIAGNOSIS — E1159 Type 2 diabetes mellitus with other circulatory complications: Secondary | ICD-10-CM

## 2018-07-04 ENCOUNTER — Encounter: Payer: Self-pay | Admitting: Family Medicine

## 2018-07-04 ENCOUNTER — Ambulatory Visit (INDEPENDENT_AMBULATORY_CARE_PROVIDER_SITE_OTHER): Payer: Medicare Other | Admitting: Family Medicine

## 2018-07-04 ENCOUNTER — Other Ambulatory Visit: Payer: Self-pay

## 2018-07-04 VITALS — BP 132/71 | HR 60 | Temp 96.0°F | Ht 68.5 in | Wt 184.0 lb

## 2018-07-04 DIAGNOSIS — I152 Hypertension secondary to endocrine disorders: Secondary | ICD-10-CM

## 2018-07-04 DIAGNOSIS — E559 Vitamin D deficiency, unspecified: Secondary | ICD-10-CM

## 2018-07-04 DIAGNOSIS — E039 Hypothyroidism, unspecified: Secondary | ICD-10-CM | POA: Diagnosis not present

## 2018-07-04 DIAGNOSIS — E119 Type 2 diabetes mellitus without complications: Secondary | ICD-10-CM

## 2018-07-04 DIAGNOSIS — I10 Essential (primary) hypertension: Secondary | ICD-10-CM

## 2018-07-04 DIAGNOSIS — R5383 Other fatigue: Secondary | ICD-10-CM | POA: Diagnosis not present

## 2018-07-04 DIAGNOSIS — Z23 Encounter for immunization: Secondary | ICD-10-CM

## 2018-07-04 DIAGNOSIS — Z0000 Encounter for general adult medical examination without abnormal findings: Secondary | ICD-10-CM

## 2018-07-04 DIAGNOSIS — M7061 Trochanteric bursitis, right hip: Secondary | ICD-10-CM

## 2018-07-04 DIAGNOSIS — E1159 Type 2 diabetes mellitus with other circulatory complications: Secondary | ICD-10-CM | POA: Diagnosis not present

## 2018-07-04 DIAGNOSIS — D519 Vitamin B12 deficiency anemia, unspecified: Secondary | ICD-10-CM | POA: Diagnosis not present

## 2018-07-04 DIAGNOSIS — D509 Iron deficiency anemia, unspecified: Secondary | ICD-10-CM

## 2018-07-04 MED ORDER — ZOSTER VAC RECOMB ADJUVANTED 50 MCG/0.5ML IM SUSR: 0.5000 mL | Freq: Once | INTRAMUSCULAR | 0 refills | Status: AC

## 2018-07-04 MED ORDER — TETANUS-DIPHTH-ACELL PERTUSSIS 5-2.5-18.5 LF-MCG/0.5 IM SUSP: 0.5000 mL | Freq: Once | INTRAMUSCULAR | 0 refills | Status: AC

## 2018-07-04 NOTE — Progress Notes (Addendum)
Telehealth office visit note for Kyle Simon, D.O- at Primary Care at [facility]   I connected with current patient today and verified that I am speaking with the correct person using two identifiers.   . Location of the patient: Home . Location of the provider: Office Only the patient (+/- their family members at pt's discretion) and myself were participating in the encounter    - This visit type was conducted due to national recommendations for restrictions regarding the COVID-19 Pandemic (e.g. social distancing) in an effort to limit this patient's exposure and mitigate transmission in our community.  This format is felt to be most appropriate for this patient at this time.   - The patient did not have access to video technology or had technical difficulties with video requiring transitioning to audio format only. - No physical exam could be performed with this format, beyond that communicated to [place] by the patient/ family members as noted.   - Additionally my office staff/ schedulers discussed with the patient that there may be a monetary charge related to this service, depending on their medical insurance.   The patient expressed understanding, and agreed to proceed.    Subjective:   Kyle Simon is a 85 y.o. male who presents for Medicare Annual/Subsequent preventive examination.  - has recently moved into new home- stuck in house  - declines shingles vaccine- Shingrix.        Objective:    Vitals: BP 132/71   Pulse 60   Temp (!) 96 F (35.6 C)   Ht 5' 8.5" (1.74 m)   Wt 184 lb (83.5 kg)   BMI 27.57 kg/m   Body mass index is 27.57 kg/m.    Advanced Directives 11/03/2017 10/25/2017 11/16/2016 09/15/2016 07/25/2015 10/11/2013 09/30/2011  Does Patient Have a Medical Advance Directive? Yes Yes Yes Yes Yes Yes Patient has advance directive, copy in chart  Type of Advance Directive - [place];Living will Living will [place];Living will  [place];Living will [place];Living will [place];Living will  Does patient want to make changes to medical advance directive? - No - Patient declined No - Patient declined - No - Patient declined No - Patient declined -  Copy of [place] in Chart? - Yes - - - Yes -  Pre-existing out of facility DNR order (yellow form or pink MOST form) - - - - - - No    Tobacco Social History   Tobacco Use  Smoking Status Former Smoker  . Packs/day: 1.00  . Years: 18.00  . Pack years: 18.00  . Last attempt to quit: 03/01/1966  . Years since quitting: 52.3  Smokeless Tobacco Never Used     Counseling given: Not Answered    Past Medical History:  Diagnosis Date  . Arthritis    both hands;Dr.Deveshwar  . Constipation due to pain medication   . Diabetes mellitus without complication ([place])    Type II. Uses no medication. Pt controls with diet and weight  . Enlarged prostate   . Frequent urination at night   . GERD (gastroesophageal reflux disease)   . Hyperlipidemia   . Hypertension   . Hypothyroidism   . Polio    as a child w/o complications  . Snoring    no sleep apnea  . Torn meniscus   . Transfusion history 2012   post TKR    Past Surgical History:  Procedure Laterality Date  .  ANTERIOR LAT LUMBAR FUSION Right 11/03/2017   Procedure: Right Lumbar three-four Anterolateral lumbar interbody fusion with lateral plate;  Surgeon: Erline Levine, MD;  Location: [place];  Service: Neurosurgery;  Laterality: Right;  . BACK SURGERY  2011   hemiarthroplasty 01/11/1999 and 6  . CARDIAC CATHETERIZATION  09/2001   negative  . COLONOSCOPY    . EYE SURGERY  2003   R&L cataract removed & IOL- 2003 & 2007,Dr.Jenkins  . INGUINAL HERNIA REPAIR  2002   right, Dr. Truitt Leep  . JOINT REPLACEMENT  2012   left knee replacement  . LIPOMA EXCISION     back  . MENISCECTOMY Bilateral    Dr. Noemi Chapel  . PROSTATE SURGERY  07/2006    for urinary urgency, Dr. Risa Grill  . ROTATOR CUFF REPAIR Right October, 2016  . sinus & throat surgery-1995  1995  . TONSILLECTOMY    . TOTAL KNEE ARTHROPLASTY  01/11/2011   Procedure: TOTAL KNEE ARTHROPLASTY;  Surgeon: Rudean Haskell, MD;  Location: [place];  Service: Orthopedics;  Laterality: Left;  . TOTAL KNEE ARTHROPLASTY Right 11/22/2016   Procedure: TOTAL KNEE ARTHROPLASTY;  Surgeon: Vickey Huger, MD;  Location: [place];  Service: Orthopedics;  Laterality: Right;  . UVULECTOMY  5320   Dr. Erik Obey  . VASECTOMY    . WRIST SURGERY  1959   fracture- right    Family History  Problem Relation Age of Onset  . Sudden death Father        MVA  . Coronary artery disease Mother   . Emphysema Mother   . Diverticulosis Mother   . Thyroid disease Mother   . Asthma Brother   . Bone cancer Brother   . Diabetes Paternal Grandmother   . Sarcoidosis Son   . Thyroid disease Daughter   . Anesthesia problems Neg Hx   . Hypotension Neg Hx   . Malignant hyperthermia Neg Hx   . Pseudochol deficiency Neg Hx   . Colon cancer Neg Hx     Social History   Socioeconomic History  . Marital status: Married    Spouse name: Not on file  . Number of children: Not on file  . Years of education: Not on file  . Highest education level: Not on file  Occupational History  . Not on file  Social Needs  . Financial resource strain: Not on file  . Food insecurity:    Worry: Not on file    Inability: Not on file  . Transportation needs:    Medical: Not on file    Non-medical: Not on file  Tobacco Use  . Smoking status: Former Smoker    Packs/day: 1.00    Years: 18.00    Pack years: 18.00    Last attempt to quit: 03/01/1966    Years since quitting: 52.3  . Smokeless tobacco: Never Used  Substance and Sexual Activity  . Alcohol use: Yes    Alcohol/week: 4.0 standard drinks    Types: 4 Shots of liquor per week    Comment: social, few drinks a week  . Drug use: No  . Sexual activity: Yes   Lifestyle  . Physical activity:    Days per week: Not on file    Minutes per session: Not on file  . Stress: Not on file  Relationships  . Social connections:    Talks on phone: Not on file    Gets together: Not on file    Attends religious service: Not on file  Active member of club or organization: Not on file    Attends meetings of clubs or organizations: Not on file    Relationship status: Not on file  Other Topics Concern  . Not on file  Social History Narrative  . Not on file    Outpatient Encounter Medications as of 07/04/2018  Medication Sig  . acetaminophen (TYLENOL) 500 MG tablet Take 1,000 mg by mouth every 6 (six) hours as needed for mild pain.   Marland Kitchen aspirin 81 MG tablet Take 81 mg by mouth every evening.   . Calcium Carb-Cholecalciferol (CALCIUM 600 + D PO) Take 1 tablet by mouth daily.   Marland Kitchen CALCIUM-MAGNESIUM-ZINC PO Take 1 tablet by mouth daily.  Marland Kitchen glucose blood (PRECISION XTRA TEST STRIPS) test strip USE TO CHECK BLOOD SUGAR ONCE DAILY DX E11.9  . Hypromellose (ARTIFICIAL TEARS OP) Place 1 drop into both eyes 2 (two) times daily.  Marland Kitchen LANCETS ULTRA THIN 30G MISC Patient is to use to check blood glucose in the AM fasting and 2 hours after largest meal.  . losartan (COZAAR) 50 MG tablet Take 1 tablet (50 mg total) by mouth daily.  . methocarbamol (ROBAXIN) 500 MG tablet Take 1 tablet (500 mg total) by mouth every 6 (six) hours as needed for muscle spasms.  . Multiple Vitamin (MULTIVITAMIN WITH MINERALS) TABS Take 1 tablet by mouth daily.  . NONFORMULARY OR COMPOUNDED ITEM Glucometer Museum/gallery curator XTRA- Model #[id number])  . Omega-3 Fatty Acids (FISH OIL) 1000 MG CAPS Take 1,000 mg by mouth every evening.  . Probiotic Product (RESTORA) CAPS One tab qd (Patient taking differently: Take 1 capsule by mouth every evening. )  . SYNTHROID 150 MCG tablet TAKE 1 TABLET DAILY BEFORE BREAKFAST  . Tamsulosin HCl (FLOMAX) 0.4 MG CAPS Take 0.4 mg by mouth 2 (two) times daily.   .  traMADol (ULTRAM) 50 MG tablet Take 1 tablet (50 mg total) by mouth every 6 (six) hours as needed for moderate pain.  . vitamin B-12 (CYANOCOBALAMIN) 1000 MCG tablet Take 1,000 mcg by mouth daily.  . Tdap (BOOSTRIX) 5-2.5-18.5 LF-MCG/0.5 injection Inject 0.5 mLs into the muscle once for 1 dose.  Marland Kitchen Zoster Vaccine Adjuvanted [facility]) injection Inject 0.5 mLs into the muscle once for 1 dose.  . [DISCONTINUED] gabapentin (NEURONTIN) 100 MG capsule Take 1 capsule by mouth 3 (three) times daily.  . [DISCONTINUED] Potassium 99 MG TABS Take 99 mg by mouth daily.   No facility-administered encounter medications on file as of 07/04/2018.     Activities of Daily Living In your present state of health, do you have any difficulty performing the following activities: 07/04/2018 10/25/2017  Hearing? N N  Vision? N N  Difficulty concentrating or making decisions? N N  Walking or climbing stairs? N N  Dressing or bathing? N N  Doing errands, shopping? N Y  Some recent data might be hidden   Activities of Daily Living In your present state of health, do you have difficulty performing the following activities?  1- Driving - no 2- Managing money - no 3- Feeding yourself - no 4- Getting from the bed to the chair - no 5- Climbing a flight of stairs - no 6- Preparing food and eating - no 7- Bathing or showering - no 8- Getting dressed - no 9- Getting to the toilet - no 10- Using the toilet - no 11- Moving around from place to place - no  Patient states that he feels safe at home   Patient  Care Team: Kyle Dance, DO as PCP - General (Family Medicine) Rana Snare, MD as Consulting Physician (Urology) Lavonna Monarch, MD as Consulting Physician (Dermatology) Jodi Marble, MD as Consulting Physician (Otolaryngology) Shirley Muscat, Loreen Freud, MD as Referring Physician (Optometry) Inocencio Homes, DPM as Consulting Physician (Podiatry) Bo Merino, MD as Consulting Physician (Rheumatology) Vickey Huger, MD as Consulting Physician (Orthopedic Surgery) Erline Levine, MD as Consulting Physician (Neurosurgery) Deboraha Sprang, MD as Consulting Physician (Cardiology) Elayne Snare, MD as Consulting Physician (Endocrinology) Kathrynn Ducking, MD as Consulting Physician (Neurology) Jodi Marble, MD as Consulting Physician (Otolaryngology)     Assessment:   This is a routine wellness examination for [place].  Exercise Activities and Dietary recommendations Current Exercise Habits: Home exercise routine, Time (Minutes): 15, Frequency (Times/Week): 3, Weekly Exercise (Minutes/Week): 45, Intensity: Moderate, Exercise limited by: cardiac condition(s)(bike)  Goals   None     Fall Risk Fall Risk  07/04/2018 04/12/2018 05/18/2017 04/14/2017 11/11/2016  Falls in the past year? 0 0 No No No  Follow up Falls evaluation completed Falls evaluation completed - - -    Is the patient's home free of loose throw rugs in walkways, pet beds, electrical cords, etc?   yes      Grab bars in the bathroom? yes      Handrails on the stairs?   yes      Adequate lighting?   yes   Timed Get Up and Go Performed: unable to perform   Depression Screen PHQ 2/9 Scores 04/12/2018 08/22/2017 05/18/2017 04/14/2017  PHQ - 2 Score 0 0 0 0  PHQ- 9 Score 0 2 0 0   Depression screen [facility] 2/9 04/12/2018 08/22/2017 05/18/2017 04/14/2017 11/11/2016  Decreased Interest 0 0 0 0 0  Down, Depressed, Hopeless 0 0 0 0 0  PHQ - 2 Score 0 0 0 0 0  Altered sleeping 0 0 0 0 -  Tired, decreased energy 0 2 0 0 -  Change in appetite 0 0 0 0 -  Feeling bad or failure about yourself  0 0 0 0 -  Trouble concentrating 0 0 0 0 -  Moving slowly or fidgety/restless 0 0 0 0 -  Suicidal thoughts 0 0 0 0 -  PHQ-9 Score 0 2 0 0 -  Difficult doing work/chores Not difficult at all - Not difficult at all Not difficult at all -    GAD 7 : Generalized Anxiety Score 07/04/2018  Nervous, Anxious, on Edge 0  Control/stop worrying 0  Worry too much -  different things 0  Trouble relaxing 0  Restless 0  Easily annoyed or irritable 0  Afraid - awful might happen 0  Total GAD 7 Score 0  Anxiety Difficulty Not difficult at all     Cognitive Function 6CIT Screen 07/04/2018 05/18/2017  What Year? 0 points 0 points  What month? 0 points 0 points  What time? 0 points 0 points  Count back from 20 0 points 0 points  Months in reverse 0 points 0 points  Repeat phrase 0 points 0 points  Total Score 0 0     Immunization History  Administered Date(s) Administered  . Influenza Whole 01/09/2002  . Influenza,inj,Quad PF,6+ Mos 11/29/2012, 11/29/2013  . Influenza-Unspecified 11/30/2014, 11/22/2015, 01/12/2018  . MMR 07/12/2017  . Pneumococcal Conjugate-13 03/20/2014  . Pneumococcal Polysaccharide-23 03/30/1996, 02/13/2010  . Td 03/30/1996, 04/26/2007    Qualifies for Shingles Vaccine? Will send in  Screening Tests Health Maintenance  Topic Date Due  .  TETANUS/TDAP  04/25/2017  . INFLUENZA VACCINE  09/30/2018  . HEMOGLOBIN A1C  10/11/2018  . OPHTHALMOLOGY EXAM  02/08/2019  . FOOT EXAM  04/13/2019  . PNA vac Low Risk Adult  Completed  . COLONOSCOPY  Discontinued    Cancer Screenings: Lung: Low Dose CT Chest recommended if Age 80-80 years, 30 pack-year currently smoking OR have quit w/in 15years. Patient does not qualify. Colorectal: 05/17/2012      Plan:     - shingrix and Tdap sent in to pharmacy  - not exercising--> recommending 3-min daily cardio.     Return for DM, HTN, HLD follow up every 19mo w FBW 3-5d prior.   I have personally reviewed and noted the following in the patient's chart:   . Medical and social history . Use of alcohol, tobacco or illicit drugs  . Current medications and supplements . Functional ability and status . Nutritional status . Physical activity . Advanced directives . List of other physicians . Hospitalizations, surgeries, and ER visits in previous 12 months . Vitals . Screenings to  include cognitive, depression, and falls . Referrals and appointments  In addition, I have reviewed and discussed with patient certain preventive protocols, quality metrics, and best practice recommendations. A written personalized care plan for preventive services as well as general preventive health recommendations were provided to patient.    DMellody Dance DO  07/04/2018

## 2018-08-10 DIAGNOSIS — M25561 Pain in right knee: Secondary | ICD-10-CM | POA: Diagnosis not present

## 2018-08-10 DIAGNOSIS — Z96651 Presence of right artificial knee joint: Secondary | ICD-10-CM | POA: Diagnosis not present

## 2018-08-15 DIAGNOSIS — M25561 Pain in right knee: Secondary | ICD-10-CM | POA: Diagnosis not present

## 2018-08-15 DIAGNOSIS — Z96651 Presence of right artificial knee joint: Secondary | ICD-10-CM | POA: Diagnosis not present

## 2018-08-15 DIAGNOSIS — G8929 Other chronic pain: Secondary | ICD-10-CM | POA: Diagnosis not present

## 2018-08-16 ENCOUNTER — Other Ambulatory Visit: Payer: Self-pay | Admitting: Family Medicine

## 2018-08-16 DIAGNOSIS — I152 Hypertension secondary to endocrine disorders: Secondary | ICD-10-CM

## 2018-08-16 DIAGNOSIS — E1159 Type 2 diabetes mellitus with other circulatory complications: Secondary | ICD-10-CM

## 2018-08-25 DIAGNOSIS — M25561 Pain in right knee: Secondary | ICD-10-CM | POA: Diagnosis not present

## 2018-08-25 DIAGNOSIS — Z96651 Presence of right artificial knee joint: Secondary | ICD-10-CM | POA: Diagnosis not present

## 2018-08-25 DIAGNOSIS — G8929 Other chronic pain: Secondary | ICD-10-CM | POA: Diagnosis not present

## 2018-08-28 DIAGNOSIS — L821 Other seborrheic keratosis: Secondary | ICD-10-CM | POA: Diagnosis not present

## 2018-08-28 DIAGNOSIS — N401 Enlarged prostate with lower urinary tract symptoms: Secondary | ICD-10-CM | POA: Diagnosis not present

## 2018-08-28 DIAGNOSIS — K8020 Calculus of gallbladder without cholecystitis without obstruction: Secondary | ICD-10-CM | POA: Diagnosis not present

## 2018-08-28 DIAGNOSIS — L570 Actinic keratosis: Secondary | ICD-10-CM | POA: Diagnosis not present

## 2018-08-28 DIAGNOSIS — R351 Nocturia: Secondary | ICD-10-CM | POA: Diagnosis not present

## 2018-08-28 DIAGNOSIS — D229 Melanocytic nevi, unspecified: Secondary | ICD-10-CM | POA: Diagnosis not present

## 2018-08-28 DIAGNOSIS — R1031 Right lower quadrant pain: Secondary | ICD-10-CM | POA: Diagnosis not present

## 2018-08-28 DIAGNOSIS — M545 Low back pain: Secondary | ICD-10-CM | POA: Diagnosis not present

## 2018-08-29 DIAGNOSIS — M160 Bilateral primary osteoarthritis of hip: Secondary | ICD-10-CM | POA: Diagnosis not present

## 2018-08-29 DIAGNOSIS — M25551 Pain in right hip: Secondary | ICD-10-CM | POA: Diagnosis not present

## 2018-08-29 DIAGNOSIS — Z96651 Presence of right artificial knee joint: Secondary | ICD-10-CM | POA: Diagnosis not present

## 2018-08-29 DIAGNOSIS — G8929 Other chronic pain: Secondary | ICD-10-CM | POA: Diagnosis not present

## 2018-08-29 DIAGNOSIS — M25561 Pain in right knee: Secondary | ICD-10-CM | POA: Diagnosis not present

## 2018-09-07 DIAGNOSIS — G8929 Other chronic pain: Secondary | ICD-10-CM | POA: Diagnosis not present

## 2018-09-07 DIAGNOSIS — M25561 Pain in right knee: Secondary | ICD-10-CM | POA: Diagnosis not present

## 2018-09-07 DIAGNOSIS — Z96651 Presence of right artificial knee joint: Secondary | ICD-10-CM | POA: Diagnosis not present

## 2018-09-12 DIAGNOSIS — M5416 Radiculopathy, lumbar region: Secondary | ICD-10-CM | POA: Diagnosis not present

## 2018-09-13 IMAGING — RF DG C-ARM 61-120 MIN
1 series · 2 of 2 positions shown · non-contrast
Comparison: MR lumbar spine 09/06/2017

CLINICAL DATA: Fusion at L3-4

EXAM:
DG C-ARM 61-120 MIN

[Series 1: run · 2 of 2 slices shown]
[im 1/2]
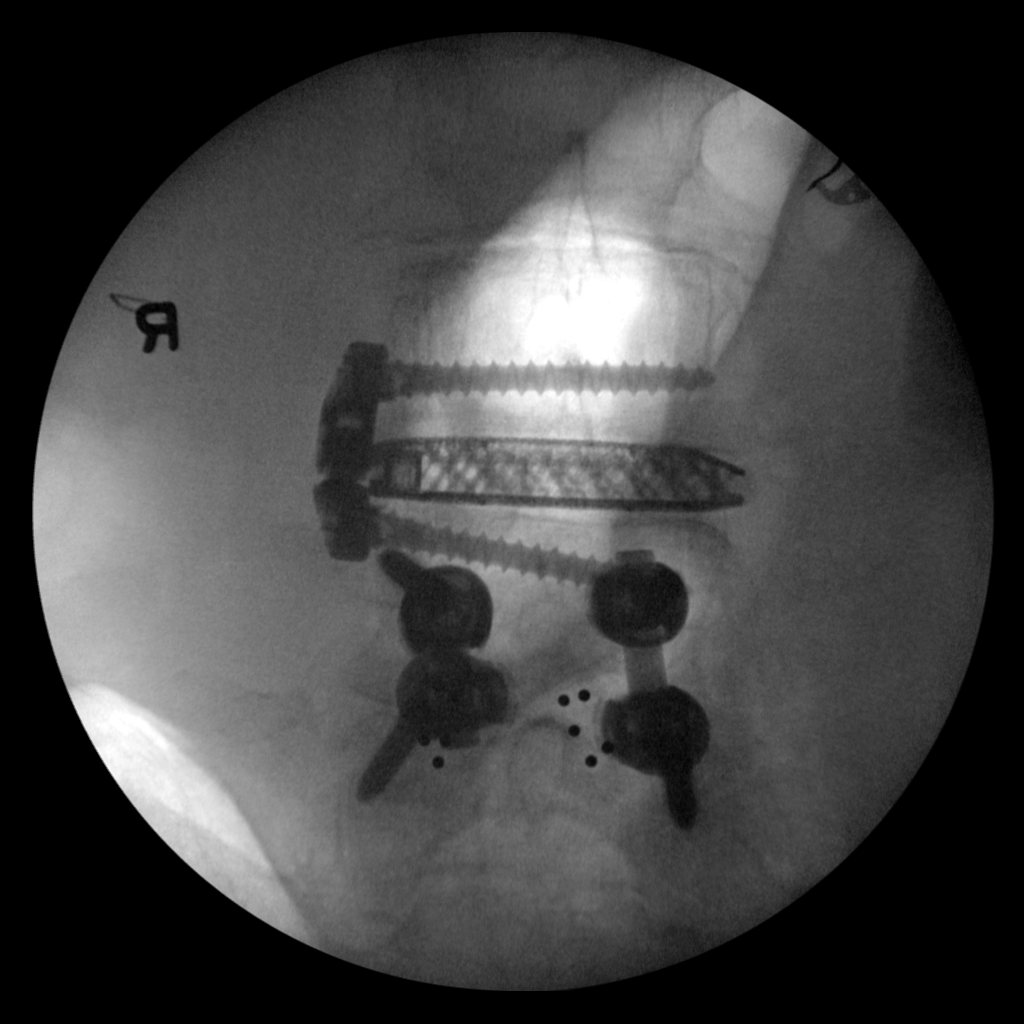
[im 2/2]
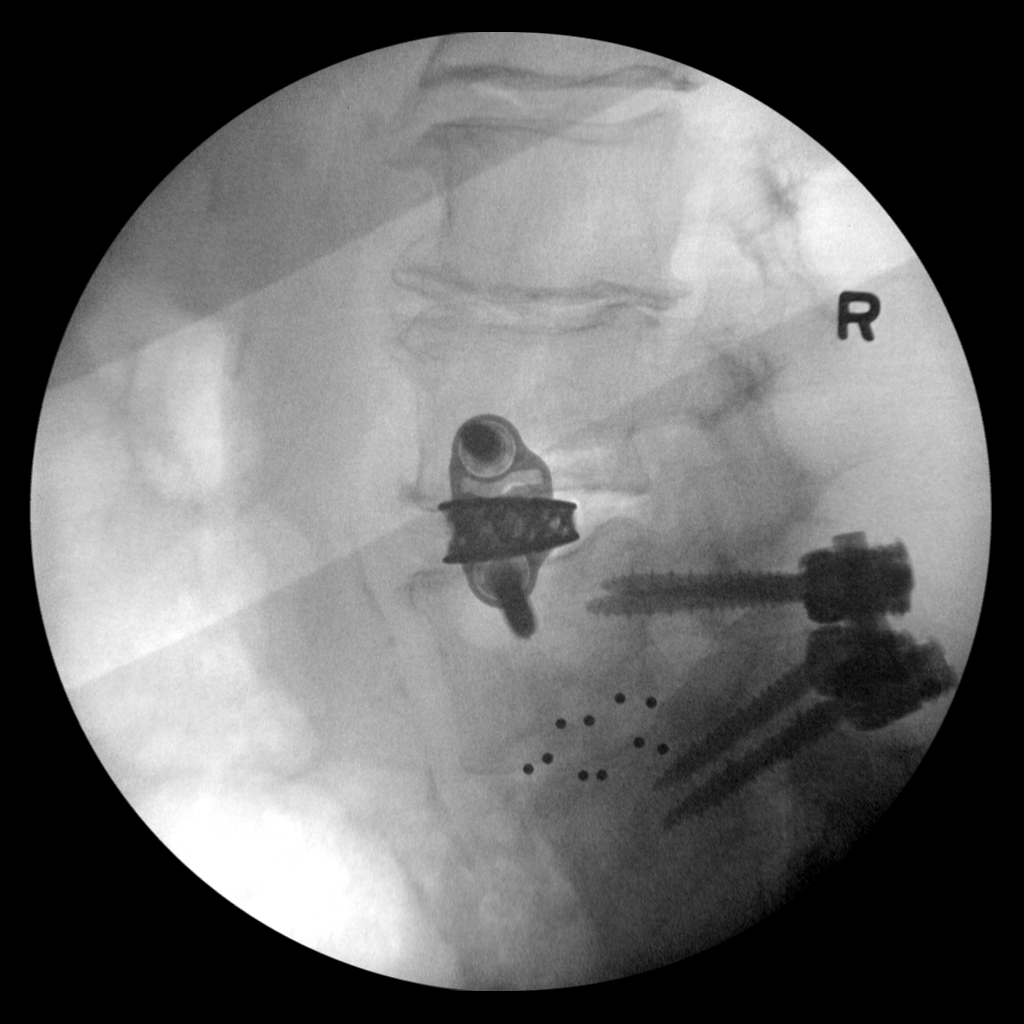

[2 of 2 positions shown; findings below may reference images not displayed]

FINDINGS: Two C-arm spot films were returned. These show right lateral fusion
at L3-4 with interbody fusion plug present in good position.
Interbody fusion at L4-5 is again noted. Fluoroscopy time 1 minutes
54 seconds was recorded.
IMPRESSION: C-arm fluoroscopy provided during fusion at L3-4.

## 2018-09-20 DIAGNOSIS — M545 Low back pain: Secondary | ICD-10-CM | POA: Diagnosis not present

## 2018-09-20 DIAGNOSIS — M48062 Spinal stenosis, lumbar region with neurogenic claudication: Secondary | ICD-10-CM | POA: Diagnosis not present

## 2018-09-20 DIAGNOSIS — M5416 Radiculopathy, lumbar region: Secondary | ICD-10-CM | POA: Diagnosis not present

## 2018-09-20 DIAGNOSIS — M4310 Spondylolisthesis, site unspecified: Secondary | ICD-10-CM | POA: Diagnosis not present

## 2018-10-10 DIAGNOSIS — G8929 Other chronic pain: Secondary | ICD-10-CM | POA: Diagnosis not present

## 2018-10-10 DIAGNOSIS — M545 Low back pain: Secondary | ICD-10-CM | POA: Diagnosis not present

## 2018-10-10 DIAGNOSIS — M5136 Other intervertebral disc degeneration, lumbar region: Secondary | ICD-10-CM | POA: Diagnosis not present

## 2018-10-19 ENCOUNTER — Other Ambulatory Visit: Payer: Self-pay

## 2018-10-19 DIAGNOSIS — Z20822 Contact with and (suspected) exposure to covid-19: Secondary | ICD-10-CM

## 2018-10-20 LAB — NOVEL CORONAVIRUS, NAA: SARS-CoV-2, NAA: NOT DETECTED

## 2018-10-24 DIAGNOSIS — M545 Low back pain: Secondary | ICD-10-CM | POA: Diagnosis not present

## 2018-10-24 DIAGNOSIS — M5136 Other intervertebral disc degeneration, lumbar region: Secondary | ICD-10-CM | POA: Diagnosis not present

## 2018-10-24 DIAGNOSIS — G8929 Other chronic pain: Secondary | ICD-10-CM | POA: Diagnosis not present

## 2018-11-07 NOTE — Progress Notes (Signed)
Office Visit Note  Patient: Kyle Simon             Date of Birth: [birth year]           MRN: [id number]             PCP: Mellody Dance, DO Referring: Mellody Dance, DO Visit Date: 11/21/2018 Occupation: @GUAROCC @  Subjective:  Discuss recent DEXA results    History of Present Illness: Kyle Simon is a 85 y.o. male with history of osteoporosis.  He is taking a calcium and vitamin D supplement.  He has not had any recent fractures or falls.  He reports that his lower back pain has improved since going to physical therapy.  He is not performing home exercises.  He reports he has been walking 1 mile 4 to 5 days/week which is also been helping with his overall arthralgias.  He states that he experiences pain in both knee replacements but denies any joint swelling.  He has occasional right shoulder joint pain and some weakness.  He has attended physical therapy in the past but denies much improvement.  He reports he has good range of motion of the right shoulder.  He denies any other joint pain or joint swelling at this time.   Activities of Daily Living:  Patient reports morning stiffness for 0 minutes.   Patient Denies nocturnal pain.  Difficulty dressing/grooming: Denies Difficulty climbing stairs: Denies Difficulty getting out of chair: Denies Difficulty using hands for taps, buttons, cutlery, and/or writing: Denies  Review of Systems  Constitutional: Negative for fatigue and night sweats.  HENT: Negative for mouth sores, mouth dryness and nose dryness.   Eyes: Negative for redness, itching and dryness.  Respiratory: Negative for cough, hemoptysis, shortness of breath, wheezing and difficulty breathing.   Cardiovascular: Negative for chest pain, palpitations, hypertension, irregular heartbeat and swelling in legs/feet.  Gastrointestinal: Negative for abdominal pain, blood in stool, constipation and diarrhea.  Endocrine: Negative for increased urination.  Genitourinary:  Negative for difficulty urinating and painful urination.  Musculoskeletal: Positive for arthralgias and joint pain. Negative for joint swelling, myalgias, muscle weakness, morning stiffness, muscle tenderness and myalgias.  Skin: Negative for color change, rash, hair loss, nodules/bumps, skin tightness, ulcers and sensitivity to sunlight.  Allergic/Immunologic: Negative for susceptible to infections.  Neurological: Negative for dizziness, fainting, numbness, headaches, memory loss, night sweats and weakness.  Hematological: Negative for bruising/bleeding tendency and swollen glands.  Psychiatric/Behavioral: Negative for depressed mood and sleep disturbance. The patient is not nervous/anxious.     PMFS History:  Patient Active Problem List   Diagnosis Date Noted  . Statin declined-  11/20/2018  . History of noncompliance with medical treatment, presenting hazards to health 11/20/2018  . SOB (shortness of breath) 10/05/2017  . Aortic atherosclerosis ([place]) 10/05/2017  . Fatigue 08/22/2017  . Chronic pain of right knee 08/22/2017  . Stressful life event affecting family-loss on May 31st 08/22/2017  . Inactivity-recently due to knee pain 08/22/2017  . Lumbar radiculopathy 06/16/2017  . Total knee replacement status, right 04/14/2017  . DM assoc with CKD (chronic kidney disease), stage III ([place]) 04/14/2017  . Arthralgia of right temporomandibular joint 03/02/2017  . Chronic eczematous otitis externa of both ears 03/02/2017  . Presbycusis of both ears 03/02/2017  . H/O total knee replacement, bilateral 11/22/2016  . ETD (Eustachian tube dysfunction), right 09/15/2016  . Strain of masseter muscle 09/15/2016  . Trochanteric bursitis of right hip 09/07/2016  . Age-related osteoporosis without current pathological  fracture 07/16/2016  . History of lumbar fusion 07/16/2016  . History of total knee arthroplasty, left 07/16/2016  . Cough 01/04/2016  . High risk medications (not anticoagulants)  long-term use 01/04/2016  . Hypomagnesemia 12/29/2015  . h/o Hyponatremia 12/29/2015  . Cerumen impaction 08/25/2015  . Diabetes mellitus without complication ([place]) 09/09/1973  . Flatulence symptom 07/25/2015  . Diarrhea 06/21/2015  . Chronic rhinitis 11/01/2014  . Hypertension associated with diabetes ([place]) 03/20/2014  . Spondylolisthesis of lumbar region 10/23/2013  . Diverticulosis of colon without hemorrhage 01/22/2013  . B12 deficiency anemia 01/22/2013  . Anemia, unspecified 08/13/2012  . Synovial cyst of lumbar spine 07/26/2011  . Spasms of the hands or feet 07/26/2011  . GERD (gastroesophageal reflux disease) 03/26/2011  . SENILE KERATOSIS 06/12/2008  . Hyperlipidemia associated with type 2 diabetes mellitus ([place]) 04/19/2007  . SNORING, HX OF 04/19/2007  . BPH (benign prostatic hyperplasia) 04/18/2007  . Hypothyroidism 04/20/2006    Past Medical History:  Diagnosis Date  . Arthritis    both hands;Dr.Deveshwar  . Constipation due to pain medication   . Diabetes mellitus without complication ([place])    Type II. Uses no medication. Pt controls with diet and weight  . Enlarged prostate   . Frequent urination at night   . GERD (gastroesophageal reflux disease)   . History of noncompliance with medical treatment, presenting hazards to health 11/20/2018  . Hyperlipidemia   . Hyperlipidemia associated with type 2 diabetes mellitus ([place]) 04/19/2007   Qualifier: Diagnosis of  By: Linna Darner MD, Gwyndolyn Saxon    . Hypertension   . Hypothyroidism   . Polio    as a child w/o complications  . Snoring    no sleep apnea  . Statin declined-  11/20/2018   patient understands risks associated with this decision and has been advised to restart by cardiologist as well as myself several times  . Torn meniscus   . Transfusion history 2012   post TKR    Family History  Problem Relation Age of Onset  . Sudden death Father        MVA  . Coronary artery disease Mother   . Emphysema Mother   .  Diverticulosis Mother   . Thyroid disease Mother   . Asthma Brother   . Bone cancer Brother   . Diabetes Paternal Grandmother   . Sarcoidosis Son   . Thyroid disease Daughter   . Anesthesia problems Neg Hx   . Hypotension Neg Hx   . Malignant hyperthermia Neg Hx   . Pseudochol deficiency Neg Hx   . Colon cancer Neg Hx    Past Surgical History:  Procedure Laterality Date  . ANTERIOR LAT LUMBAR FUSION Right 11/03/2017   Procedure: Right Lumbar three-four Anterolateral lumbar interbody fusion with lateral plate;  Surgeon: Erline Levine, MD;  Location: [facility];  Service: Neurosurgery;  Laterality: Right;  . BACK SURGERY  2011   hemiarthroplasty 01/11/1999 and 6  . CARDIAC CATHETERIZATION  09/2001   negative  . COLONOSCOPY    . EYE SURGERY  2003   R&L cataract removed & IOL- 2003 & 2007,Dr.Jenkins  . INGUINAL HERNIA REPAIR  2002   right, Dr. Truitt Leep  . JOINT REPLACEMENT  2012   left knee replacement  . LIPOMA EXCISION     back  . MENISCECTOMY Bilateral    Dr. Noemi Chapel  . PROSTATE SURGERY  07/2006   for urinary urgency, Dr. Risa Grill  . ROTATOR CUFF REPAIR Right October, 2016  . sinus & throat  surgery-1995  1995  . TONSILLECTOMY    . TOTAL KNEE ARTHROPLASTY  01/11/2011   Procedure: TOTAL KNEE ARTHROPLASTY;  Surgeon: Rudean Haskell, MD;  Location: [facility];  Service: Orthopedics;  Laterality: Left;  . TOTAL KNEE ARTHROPLASTY Right 11/22/2016   Procedure: TOTAL KNEE ARTHROPLASTY;  Surgeon: Vickey Huger, MD;  Location: [facility];  Service: Orthopedics;  Laterality: Right;  . UVULECTOMY  3903   Dr. Erik Obey  . VASECTOMY    . WRIST SURGERY  1959   fracture- right   Social History   Social History Narrative  . Not on file   Immunization History  Administered Date(s) Administered  . Influenza Whole 01/09/2002  . Influenza,inj,Quad PF,6+ Mos 11/29/2012, 11/29/2013  . Influenza-Unspecified 11/30/2014, 11/22/2015, 01/12/2018  . MMR 07/12/2017  . Pneumococcal Conjugate-13 03/20/2014  .  Pneumococcal Polysaccharide-23 03/30/1996, 02/13/2010  . Td 03/30/1996, 04/26/2007     Objective: Vital Signs: BP 125/72 (BP Location: Left Arm, Patient Position: Sitting, Cuff Size: Normal)   Pulse (!) 52   Resp 15   Ht 5' 8.5" (1.74 m)   Wt 193 lb (87.5 kg)   BMI 28.92 kg/m    Physical Exam Vitals signs and nursing note reviewed.  Constitutional:      Appearance: He is well-developed.  HENT:     Head: Normocephalic and atraumatic.  Eyes:     Conjunctiva/sclera: Conjunctivae normal.     Pupils: Pupils are equal, round, and reactive to light.  Neck:     Musculoskeletal: Normal range of motion and neck supple.  Cardiovascular:     Rate and Rhythm: Normal rate and regular rhythm.     Heart sounds: Normal heart sounds.  Pulmonary:     Effort: Pulmonary effort is normal.     Breath sounds: Normal breath sounds.  Abdominal:     General: Bowel sounds are normal.     Palpations: Abdomen is soft.  Skin:    General: Skin is warm and dry.     Capillary Refill: Capillary refill takes less than 2 seconds.  Neurological:     Mental Status: He is alert and oriented to person, place, and time.  Psychiatric:        Behavior: Behavior normal.      Musculoskeletal Exam: C-spine limited range of motion.  Thoracic kyphosis noted.  Limited range of motion lumbar spine.  Shoulder joints have good range of motion with some discomfort in the right shoulder.  Elbow joints, wrist joints, MCPs, PIPs, DIPs good range of motion no synovitis.  He has PIP and DIP synovial thickening consistent with osteoarthritis of bilateral hands.  Hip joints have good range of motion.  Bilateral knee replacements have good range of motion with mild warmth but no effusion.  Ankle joints have good range of motion with no tenderness or inflammation.  CDAI Exam: CDAI Score: - Patient Global: -; Provider Global: - Swollen: -; Tender: - Joint Exam   No joint exam has been documented for this visit   There is  currently no information documented on the homunculus. Go to the Rheumatology activity and complete the homunculus joint exam.  Investigation: No additional findings.  Imaging: No results found.  Recent Labs: Lab Results  Component Value Date   WBC 4.0 11/15/2018   HGB 13.4 11/15/2018   PLT 251 11/15/2018   NA 127 (L) 11/15/2018   K 5.3 (H) 11/15/2018   CL 91 (L) 11/15/2018   CO2 23 11/15/2018   GLUCOSE 115 (H) 11/15/2018  BUN 13 11/15/2018   CREATININE 0.92 11/15/2018   BILITOT 0.5 11/15/2018   ALKPHOS 85 11/15/2018   AST 15 11/15/2018   ALT 13 11/15/2018   PROT 7.3 11/15/2018   ALBUMIN 4.5 11/15/2018   CALCIUM 9.8 11/15/2018   GFRAA 89 11/15/2018    Speciality Comments: No specialty comments available.  Procedures:  No procedures performed Allergies: Simvastatin and Oxycodone     Assessment / Plan:     Visit Diagnoses: Age-related osteoporosis without current pathological fracture - 05/27/2015 DEXA T score -2.5 right radius.  11/13/18: T-score -2.7 BMD 0.672 right distal radius. Recent DEXA results were discussed with the patient. He has not had any recent falls or fractures.  He has thoracic kyphosis but no recent height loss.  He has been taking a calcium and vitamin D supplement.  We discussed that he should be obtaining 1200 mg of calcium daily either through supplements or his diet.  He was advised to calculate how much calcium he is ingesting on average daily. His calcium level was 9.8 and vitamin D was 48 on 11/15/2018.  He is naive to osteoporosis therapy.  Treatment with bisphosphonate deferred in the past due to GI work up in 2017. He has a history of GERD.  He will be a good candidate for Reclast IV infusions.  Indications, contraindications, potential side effects of Reclast were discussed.  He is having major dental work in October but we will wait to schedule the infusion until after that.  He will follow up in 6 months.   Spondylolisthesis of lumbar region:  He has no lower back pain at this time.  He recently went to physical therapy which helped his lower back pain significantly.  He has been performing home exercises on a regular basis.  He has no symptoms of radiculopathy at this time.  He has no midline spinal tenderness.  H/O total knee replacement, bilateral - Right total knee replacement 11/22/2016 by Dr. Lorre Nick, left total knee replacement 2012 by Dr. Lorre Nick.  He has good range of motion of bilateral knee replacements.  He has mild warmth but no effusion.  He experiences discomfort when walking for prolonged distances.  Chronic right shoulder pain: He has good range of motion of the right shoulder joint on exam.  He has some discomfort with range of motion.  He experiences some weakness in the right shoulder at times.  He has attended physical therapy in the past but did not notice much improvement.  He has no difficulty with ADLs.  History of lumbar fusion: He has limited range of motion of the lumbar spine.  He recently went to physical therapy which helped his lower back pain significantly.  He is not having any discomfort at this time.   Other medical conditions are listed as follows:  History of anemia  History of gastroesophageal reflux (GERD)  History of hyperlipidemia  History of diabetes mellitus  History of hypertension  Subungual hematoma of right foot, initial encounter  Aortic atherosclerosis (HCC)  Diverticulosis of colon without hemorrhage  DM assoc with CKD (chronic kidney disease), stage III ([place])  Orders: No orders of the defined types were placed in this encounter.  No orders of the defined types were placed in this encounter.   Face-to-face time spent with patient was 30 minutes. Greater than 50% of time was spent in counseling and coordination of care.  Follow-Up Instructions: Return in about 6 months (around 05/21/2019) for Osteoporosis.   Ofilia Neas, PA-C  I examined and evaluated the patient with  Hazel Sams PA.  He had detailed discussion with the patient regarding his bone density.  After discussing different treatment options he wants to proceed with IV Reclast.  Although he wants to wait until his dental procedures are complete.  The plan of care was discussed as noted above.  Bo Merino, MD  Note - This record has been created using Editor, commissioning.  Chart creation errors have been sought, but may not always  have been located. Such creation errors do not reflect on  the standard of medical care.

## 2018-11-13 DIAGNOSIS — Z981 Arthrodesis status: Secondary | ICD-10-CM | POA: Diagnosis not present

## 2018-11-13 DIAGNOSIS — Z96653 Presence of artificial knee joint, bilateral: Secondary | ICD-10-CM | POA: Diagnosis not present

## 2018-11-13 DIAGNOSIS — M8589 Other specified disorders of bone density and structure, multiple sites: Secondary | ICD-10-CM | POA: Diagnosis not present

## 2018-11-13 DIAGNOSIS — M810 Age-related osteoporosis without current pathological fracture: Secondary | ICD-10-CM | POA: Diagnosis not present

## 2018-11-15 ENCOUNTER — Encounter: Payer: Self-pay | Admitting: Family Medicine

## 2018-11-15 ENCOUNTER — Other Ambulatory Visit: Payer: Self-pay

## 2018-11-15 ENCOUNTER — Ambulatory Visit (INDEPENDENT_AMBULATORY_CARE_PROVIDER_SITE_OTHER): Payer: Medicare Other | Admitting: Family Medicine

## 2018-11-15 VITALS — BP 153/80 | HR 58 | Temp 98.5°F | Resp 18 | Wt 196.8 lb

## 2018-11-15 DIAGNOSIS — R194 Change in bowel habit: Secondary | ICD-10-CM | POA: Diagnosis not present

## 2018-11-15 DIAGNOSIS — R5383 Other fatigue: Secondary | ICD-10-CM | POA: Diagnosis not present

## 2018-11-15 DIAGNOSIS — E119 Type 2 diabetes mellitus without complications: Secondary | ICD-10-CM

## 2018-11-15 DIAGNOSIS — D519 Vitamin B12 deficiency anemia, unspecified: Secondary | ICD-10-CM | POA: Diagnosis not present

## 2018-11-15 DIAGNOSIS — I10 Essential (primary) hypertension: Secondary | ICD-10-CM | POA: Diagnosis not present

## 2018-11-15 DIAGNOSIS — M7061 Trochanteric bursitis, right hip: Secondary | ICD-10-CM | POA: Diagnosis not present

## 2018-11-15 DIAGNOSIS — E039 Hypothyroidism, unspecified: Secondary | ICD-10-CM | POA: Diagnosis not present

## 2018-11-15 DIAGNOSIS — E1159 Type 2 diabetes mellitus with other circulatory complications: Secondary | ICD-10-CM

## 2018-11-15 DIAGNOSIS — I152 Hypertension secondary to endocrine disorders: Secondary | ICD-10-CM

## 2018-11-15 DIAGNOSIS — E559 Vitamin D deficiency, unspecified: Secondary | ICD-10-CM

## 2018-11-15 DIAGNOSIS — D509 Iron deficiency anemia, unspecified: Secondary | ICD-10-CM | POA: Diagnosis not present

## 2018-11-15 DIAGNOSIS — Z0000 Encounter for general adult medical examination without abnormal findings: Secondary | ICD-10-CM

## 2018-11-15 DIAGNOSIS — R109 Unspecified abdominal pain: Secondary | ICD-10-CM | POA: Diagnosis not present

## 2018-11-15 DIAGNOSIS — K582 Mixed irritable bowel syndrome: Secondary | ICD-10-CM | POA: Diagnosis not present

## 2018-11-15 NOTE — Patient Instructions (Signed)

## 2018-11-15 NOTE — Progress Notes (Signed)
Pt here for an acute care OV today  Impression and Recommendations:    1. Bowel habit changes   2. Irritable bowel syndrome with both constipation and diarrhea   3. Abdominal pain, unspecified abdominal location   4. Diabetes mellitus without complication ([place])   5. Encounter for Medicare annual wellness exam   6. Hypertension associated with diabetes ([place])   7. Hypothyroidism, unspecified type   8. Fatigue, unspecified type   9. Trochanteric bursitis of right hip   10. Anemia due to vitamin B12 deficiency, unspecified B12 deficiency type   11. Iron deficiency anemia, unspecified iron deficiency anemia type   12. Vitamin D insufficiency     Diabetes Mellitus - Blood sugar stable at this time. - A1c currently 6.4, and prior to that was 6.2.  - Educated patient regarding diabetic gastroparesis. - Discussed importance of ongoing control of blood sugars.  - Patient knows to return for diabetic maintenance in near future.  Acute Abdominal Pain - Discussed that patient's exam during appointment today was WNL.  - Reviewed potential causes of patient symptoms. - Discussed symptoms of constipation at length.  All questions answered.  - Encouraged adequate hydration.  Discussed hydration at length. - Discussed use of Miralax prudently PRN, and stool softener as needed.  - Will continue to monitor.  Change in Bowel Habits - IBS w/ Constipation & Diarrhea - STRONGLY advised follow-up with gastroenterology with GI concerns.  Patient states last GI he did not care for.  Patient does not wish to return to that one  -A second ambulatory referral to GI provided today for pt at his request.  See orders.  We will send to [facility] through [facility]  -  for GI eval of chronic bowel changes, intermittent diarrhea/constipation.  - Will continue to monitor.  Recommendations - Advised patient to return for chronic health maintenance visits as scheduled. - Need for lab work today.  See  orders. - Discussed critical importance of follow-up as recommended.  - Advised patient to go home and check BP!  Write this down and keep a log.  - Patient knows to have follow-up OV    Orders Placed This Encounter  Procedures  . Direct LDL  . Ambulatory referral to Gastroenterology     Education and routine counseling performed. Handouts provided  Gross side effects, risk and benefits, and alternatives of medications and treatment plan in general discussed with patient.  Patient is aware that all medications have potential side effects and we are unable to predict every side effect or drug-drug interaction that may occur.   Patient will call with any questions prior to using medication if they have concerns.    Expresses verbal understanding and consents to current therapy and treatment regimen.  No barriers to understanding were identified.  Red flag symptoms and signs discussed in detail.  Patient expressed understanding regarding what to do in case of emergency\urgent symptoms   Please see AVS handed out to patient at the end of our visit for further patient instructions/ counseling done pertaining to today's office visit.   Return if symptoms worsen or fail to improve in terms of right groin pain, for Follow-up 3 months for diabetes, blood pressure etc. or sooner if labs abnormal.     Note:  This document was prepared occasionally using Dragon voice recognition software and may include unintentional dictation errors in addition to a scribe.  This document serves as a record of services personally performed by Neoma Laming  Chidera Dearcos, DO. It was created on her behalf by [name], a trained medical scribe. The creation of this record is based on the scribe's personal observations and the provider's statements to them.   I have reviewed the above medical documentation for accuracy and completeness and I concur.  Mellody Dance, DO 11/15/2018 7:36 PM         --------------------------------------------------------------------------------------------------------------------------------------------------------------------------------------------------------------------------------------------    Subjective:    CC:  Chief Complaint  Patient presents with  . Abdominal Pain    2-3 months not consistent  . Constipation    seen GI in the past but states it was a bad experience and no help    HPI: Kyle Simon is a 85 y.o. male who presents to [place] at [facility] today for issues as discussed below.  Had a hernia 15 years ago, and says "I had [the same type of] pain then, all of a sudden, out of the blue, BAM."  Pain started last week.  Says he tried to analyze what he was doing but "can't find any correlation between what I was doing or eating."  Pain lasts a split second, a few times per the day; doesn't matter whether he's standing, sitting, walking.  Notes he's had pain last week Wednesday through Monday, but none yesterday.   Sharp pain in right groin area.  Denies pain travelling to testes or anywhere else.  "Just one little very sharp jab right in there."  Says it "comes and goes in a half second."  Denies new abdominal pain, nausea, new changes in bowels.  Denies bladder pain.  - Bowel Concerns (Possibly Unrelated to Current Pain) Says he's been "building up a lot of gas," and when he goes to have a bowel movement, "the first thing that comes out is a clear liquid."  He states these sx started around three months ago.  States he takes Miralax when he has constipation.  Continues doing probiotic.  Says probably "not drinking enough water."  Says "I probably drink a gallon per day between all my liquids."  Says he drinks coffee and water.  Blood Sugars: States staying around 110.  Blood Pressure: BP at home runs 130-140.  Notes temp every day is 97.9.   No problems updated.   Wt Readings from Last 3  Encounters:  11/15/18 196 lb 12.8 oz (89.3 kg)  07/04/18 184 lb (83.5 kg)  05/09/18 193 lb (87.5 kg)   BP Readings from Last 3 Encounters:  11/16/18 (!) 153/80  07/04/18 132/71  05/09/18 111/70   BMI Readings from Last 3 Encounters:  11/15/18 29.49 kg/m  07/04/18 27.57 kg/m  05/09/18 28.92 kg/m     Patient Care Team    Relationship Specialty Notifications Start End  Mellody Dance, DO PCP - General Family Medicine  08/05/15   Rana Snare, MD Consulting Physician Urology  11/10/15   Lavonna Monarch, MD Consulting Physician Dermatology  [id number]   Jodi Marble, MD Consulting Physician Otolaryngology  11/10/15   Calton Dach, MD Referring Physician Optometry  11/10/15   Inocencio Homes, [place] Consulting Physician Podiatry  11/10/15   Bo Merino, MD Consulting Physician Rheumatology  11/10/15   Vickey Huger, MD Consulting Physician Orthopedic Surgery  11/10/15   Erline Levine, MD Consulting Physician Neurosurgery  11/10/15   Deboraha Sprang, MD Consulting Physician Cardiology  01/04/16    Comment: seen '12 last  Elayne Snare, MD Consulting Physician Endocrinology  04/14/17   Kathrynn Ducking, MD Consulting Physician  Neurology  [id number]   Jodi Marble, MD Consulting Physician Otolaryngology  04/22/17      Patient Active Problem List   Diagnosis Date Noted  . Stressful life event affecting family-loss on May 31st 08/22/2017    Priority: High  . DM assoc with CKD (chronic kidney disease), stage III ([place]) 04/14/2017    Priority: High  . Diabetes mellitus without complication ([place]) 123456    Priority: High  . Hypertension associated with diabetes ([place]) 03/20/2014    Priority: High  . Hyperlipidemia associated with type 2 diabetes mellitus ([place]) 04/19/2007    Priority: High  . H/O total knee replacement, bilateral 11/22/2016    Priority: Medium  . High risk medications (not anticoagulants) long-term use 01/04/2016    Priority: Medium  . Hypomagnesemia 12/29/2015     Priority: Medium  . h/o Hyponatremia 12/29/2015    Priority: Medium  . BPH (benign prostatic hyperplasia) 04/18/2007    Priority: Medium  . Hypothyroidism 04/20/2006    Priority: Medium  . Age-related osteoporosis without current pathological fracture 07/16/2016    Priority: Low  . Flatulence symptom 07/25/2015    Priority: Low  . Diarrhea 06/21/2015    Priority: Low  . B12 deficiency anemia 01/22/2013    Priority: Low  . Anemia, unspecified 08/13/2012    Priority: Low  . GERD (gastroesophageal reflux disease) 03/26/2011    Priority: Low  . SOB (shortness of breath) 10/05/2017  . Aortic atherosclerosis ([place]) 10/05/2017  . Fatigue 08/22/2017  . Chronic pain of right knee 08/22/2017  . Inactivity-recently due to knee pain 08/22/2017  . Lumbar radiculopathy 06/16/2017  . Total knee replacement status, right 04/14/2017  . Arthralgia of right temporomandibular joint 03/02/2017  . Chronic eczematous otitis externa of both ears 03/02/2017  . Presbycusis of both ears 03/02/2017  . ETD (Eustachian tube dysfunction), right 09/15/2016  . Strain of masseter muscle 09/15/2016  . Trochanteric bursitis of right hip 09/07/2016  . History of lumbar fusion 07/16/2016  . History of total knee arthroplasty, left 07/16/2016  . Cough 01/04/2016  . Cerumen impaction 08/25/2015  . Chronic rhinitis 11/01/2014  . Spondylolisthesis of lumbar region 10/23/2013  . Diverticulosis of colon without hemorrhage 01/22/2013  . Synovial cyst of lumbar spine 07/26/2011  . Spasms of the hands or feet 07/26/2011  . SENILE KERATOSIS 06/12/2008  . SNORING, HX OF 04/19/2007      Past Medical History:  Diagnosis Date  . Arthritis    both hands;Dr.Deveshwar  . Constipation due to pain medication   . Diabetes mellitus without complication ([place])    Type II. Uses no medication. Pt controls with diet and weight  . Enlarged prostate   . Frequent urination at night   . GERD (gastroesophageal reflux disease)    . Hyperlipidemia   . Hypertension   . Hypothyroidism   . Polio    as a child w/o complications  . Snoring    no sleep apnea  . Torn meniscus   . Transfusion history 2012   post TKR     Past Surgical History:  Procedure Laterality Date  . ANTERIOR LAT LUMBAR FUSION Right 11/03/2017   Procedure: Right Lumbar three-four Anterolateral lumbar interbody fusion with lateral plate;  Surgeon: Erline Levine, MD;  Location: [facility];  Service: Neurosurgery;  Laterality: Right;  . BACK SURGERY  2011   hemiarthroplasty 01/11/1999 and 6  . CARDIAC CATHETERIZATION  09/2001   negative  . COLONOSCOPY    . EYE SURGERY  2003  R&L cataract removed & IOL- 2003 & 2007,Dr.Jenkins  . INGUINAL HERNIA REPAIR  2002   right, Dr. Truitt Leep  . JOINT REPLACEMENT  2012   left knee replacement  . LIPOMA EXCISION     back  . MENISCECTOMY Bilateral    Dr. Noemi Chapel  . PROSTATE SURGERY  07/2006   for urinary urgency, Dr. Risa Grill  . ROTATOR CUFF REPAIR Right October, 2016  . sinus & throat surgery-1995  1995  . TONSILLECTOMY    . TOTAL KNEE ARTHROPLASTY  01/11/2011   Procedure: TOTAL KNEE ARTHROPLASTY;  Surgeon: Rudean Haskell, MD;  Location: [facility];  Service: Orthopedics;  Laterality: Left;  . TOTAL KNEE ARTHROPLASTY Right 11/22/2016   Procedure: TOTAL KNEE ARTHROPLASTY;  Surgeon: Vickey Huger, MD;  Location: [facility];  Service: Orthopedics;  Laterality: Right;  . UVULECTOMY  [id number]   Dr. Erik Obey  . VASECTOMY    . WRIST SURGERY  1959   fracture- right     Family History  Problem Relation Age of Onset  . Sudden death Father        MVA  . Coronary artery disease Mother   . Emphysema Mother   . Diverticulosis Mother   . Thyroid disease Mother   . Asthma Brother   . Bone cancer Brother   . Diabetes Paternal Grandmother   . Sarcoidosis Son   . Thyroid disease Daughter   . Anesthesia problems Neg Hx   . Hypotension Neg Hx   . Malignant hyperthermia Neg Hx   . Pseudochol deficiency Neg Hx   . Colon cancer  Neg Hx      Social History   Socioeconomic History  . Marital status: Married    Spouse name: Not on file  . Number of children: Not on file  . Years of education: Not on file  . Highest education level: Not on file  Occupational History  . Not on file  Social Needs  . Financial resource strain: Not on file  . Food insecurity    Worry: Not on file    Inability: Not on file  . Transportation needs    Medical: Not on file    Non-medical: Not on file  Tobacco Use  . Smoking status: Former Smoker    Packs/day: 1.00    Years: 18.00    Pack years: 18.00    Quit date: 03/01/1966    Years since quitting: 52.7  . Smokeless tobacco: Never Used  Substance and Sexual Activity  . Alcohol use: Yes    Alcohol/week: 4.0 standard drinks    Types: 4 Shots of liquor per week    Comment: social, few drinks a week  . Drug use: No  . Sexual activity: Yes  Lifestyle  . Physical activity    Days per week: Not on file    Minutes per session: Not on file  . Stress: Not on file  Relationships  . Social Herbalist on phone: Not on file    Gets together: Not on file    Attends religious service: Not on file    Active member of club or organization: Not on file    Attends meetings of clubs or organizations: Not on file    Relationship status: Not on file  . Intimate partner violence    Fear of current or ex partner: Not on file    Emotionally abused: Not on file    Physically abused: Not on file    Forced sexual activity:  Not on file  Other Topics Concern  . Not on file  Social History Narrative  . Not on file     No outpatient medications have been marked as taking for the 11/15/18 encounter (Office Visit) with Mellody Dance, DO.    Allergies:  Allergies  Allergen Reactions  . Simvastatin Other (See Comments)    MYALGIAS  . Oxycodone Other (See Comments)    Severe constipation     Review of Systems: General:   Denies fever, chills, unexplained weight loss.   Optho/Auditory:   Denies visual changes, blurred vision/LOV Respiratory:   Denies wheeze, DOE more than baseline levels.   Cardiovascular:   Denies chest pain, palpitations, new onset peripheral edema  Gastrointestinal:   Denies nausea, vomiting, diarrhea, abd pain.  Genitourinary: Denies dysuria, freq/ urgency, flank pain or discharge from genitals.  Endocrine:     Denies hot or cold intolerance, polyuria, polydipsia. Musculoskeletal:   Denies unexplained myalgias, joint swelling, unexplained arthralgias, gait problems.  Skin:  Denies new onset rash, suspicious lesions Neurological:     Denies dizziness, unexplained weakness, numbness  Psychiatric/Behavioral:   Denies mood changes, suicidal or homicidal ideations, hallucinations    Objective:   Blood pressure (!) 153/80, pulse (!) 58, temperature 98.5 F (36.9 C), resp. rate 18, weight 196 lb 12.8 oz (89.3 kg), SpO2 97 %. Body mass index is 29.49 kg/m. General:  Well Developed, well nourished, appropriate for stated age.  Neuro:  Alert and oriented,  extra-ocular muscles intact  HEENT:  Normocephalic, atraumatic, neck supple Skin:  no gross rash, warm, pink. Cardiac:  RRR, S1 S2 Respiratory:  ECTA B/L and A/P, Not using accessory muscles, speaking in full sentences- unlabored. Vascular:  Ext warm, no cyanosis apprec.; cap RF less 2 sec. Psych:  No HI/SI, judgement and insight good, Euthymic mood. Full Affect. Abdominal: Soft, non-tender, bowel sounds active all four quadrants, NO   G/R/R, no masses, no organomegaly.  No tenderness to deep palpation in RLQ of abdomen Genitourinary: Bilateral testes descended, no abnormalities appreciated, no epididymis tender to palpation, and no bilateral hernias appreciated.

## 2018-11-16 LAB — COMPREHENSIVE METABOLIC PANEL
ALT: 13 IU/L (ref 0–44)
AST: 15 IU/L (ref 0–40)
Albumin/Globulin Ratio: 1.6 (ref 1.2–2.2)
Albumin: 4.5 g/dL (ref 3.6–4.6)
Alkaline Phosphatase: 85 IU/L (ref 39–117)
BUN/Creatinine Ratio: 14 (ref 10–24)
BUN: 13 mg/dL (ref 8–27)
Bilirubin Total: 0.5 mg/dL (ref 0.0–1.2)
CO2: 23 mmol/L (ref 20–29)
Calcium: 9.8 mg/dL (ref 8.6–10.2)
Chloride: 91 mmol/L — ABNORMAL LOW (ref 96–106)
Creatinine, Ser: 0.92 mg/dL (ref 0.76–1.27)
GFR calc Af Amer: 89 mL/min/{1.73_m2} (ref >59)
GFR calc non Af Amer: 77 mL/min/{1.73_m2} (ref >59)
Globulin, Total: 2.8 g/dL (ref 1.5–4.5)
Glucose: 115 mg/dL — ABNORMAL HIGH (ref 65–99)
Potassium: 5.3 mmol/L — ABNORMAL HIGH (ref 3.5–5.2)
Sodium: 127 mmol/L — ABNORMAL LOW (ref 134–144)
Total Protein: 7.3 g/dL (ref 6.0–8.5)

## 2018-11-16 LAB — CBC WITH DIFFERENTIAL/PLATELET
Basophils Absolute: 0 10*3/uL (ref 0.0–0.2)
Basos: 1 %
EOS (ABSOLUTE): 0.1 10*3/uL (ref 0.0–0.4)
Eos: 3 %
Hematocrit: 39.4 % (ref 37.5–51.0)
Hemoglobin: 13.4 g/dL (ref 13.0–17.7)
Immature Grans (Abs): 0 10*3/uL (ref 0.0–0.1)
Immature Granulocytes: 0 %
Lymphocytes Absolute: 1.2 10*3/uL (ref 0.7–3.1)
Lymphs: 29 %
MCH: 31.2 pg (ref 26.6–33.0)
MCHC: 34 g/dL (ref 31.5–35.7)
MCV: 92 fL (ref 79–97)
Monocytes Absolute: 0.5 10*3/uL (ref 0.1–0.9)
Monocytes: 13 %
Neutrophils Absolute: 2.2 10*3/uL (ref 1.4–7.0)
Neutrophils: 54 %
Platelets: 251 10*3/uL (ref 150–450)
RBC: 4.3 x10E6/uL (ref 4.14–5.80)
RDW: 12 % (ref 11.6–15.4)
WBC: 4 10*3/uL (ref 3.4–10.8)

## 2018-11-16 LAB — VITAMIN D 25 HYDROXY (VIT D DEFICIENCY, FRACTURES): Vit D, 25-Hydroxy: 48 ng/mL (ref 30.0–100.0)

## 2018-11-16 LAB — LDL CHOLESTEROL, DIRECT: LDL Direct: 121 mg/dL — ABNORMAL HIGH (ref 0–99)

## 2018-11-16 LAB — T4, FREE: Free T4: 1.81 ng/dL — ABNORMAL HIGH (ref 0.82–1.77)

## 2018-11-16 LAB — VITAMIN B12: Vitamin B-12: >2000 pg/mL — ABNORMAL HIGH (ref 232–1245)

## 2018-11-16 LAB — LIPID PANEL
Chol/HDL Ratio: 4.3 ratio (ref 0.0–5.0)
Cholesterol, Total: 185 mg/dL (ref 100–199)
HDL: 43 mg/dL (ref >39)
LDL Chol Calc (NIH): 122 mg/dL — ABNORMAL HIGH (ref 0–99)
Triglycerides: 113 mg/dL (ref 0–149)
VLDL Cholesterol Cal: 20 mg/dL (ref 5–40)

## 2018-11-16 LAB — TSH: TSH: 4.91 u[IU]/mL — ABNORMAL HIGH (ref 0.450–4.500)

## 2018-11-16 LAB — HEMOGLOBIN A1C
Est. average glucose Bld gHb Est-mCnc: 131 mg/dL
Hgb A1c MFr Bld: 6.2 % — ABNORMAL HIGH (ref 4.8–5.6)

## 2018-11-20 ENCOUNTER — Encounter: Payer: Self-pay | Admitting: Family Medicine

## 2018-11-20 DIAGNOSIS — Z91199 Patient's noncompliance with other medical treatment and regimen due to unspecified reason: Secondary | ICD-10-CM | POA: Insufficient documentation

## 2018-11-20 DIAGNOSIS — Z9119 Patient's noncompliance with other medical treatment and regimen: Secondary | ICD-10-CM

## 2018-11-20 DIAGNOSIS — Z5320 Procedure and treatment not carried out because of patient's decision for unspecified reasons: Secondary | ICD-10-CM

## 2018-11-20 HISTORY — DX: Procedure and treatment not carried out because of patient's decision for unspecified reasons: Z53.20

## 2018-11-20 HISTORY — DX: Patient's noncompliance with other medical treatment and regimen: Z91.19

## 2018-11-20 HISTORY — DX: Patient's noncompliance with other medical treatment and regimen due to unspecified reason: Z91.199

## 2018-11-21 ENCOUNTER — Other Ambulatory Visit: Payer: Self-pay

## 2018-11-21 ENCOUNTER — Ambulatory Visit (INDEPENDENT_AMBULATORY_CARE_PROVIDER_SITE_OTHER): Payer: Medicare Other | Admitting: Rheumatology

## 2018-11-21 ENCOUNTER — Encounter: Payer: Self-pay | Admitting: Rheumatology

## 2018-11-21 VITALS — BP 125/72 | HR 52 | Resp 15 | Ht 68.5 in | Wt 193.0 lb

## 2018-11-21 DIAGNOSIS — Z981 Arthrodesis status: Secondary | ICD-10-CM

## 2018-11-21 DIAGNOSIS — M4316 Spondylolisthesis, lumbar region: Secondary | ICD-10-CM

## 2018-11-21 DIAGNOSIS — G8929 Other chronic pain: Secondary | ICD-10-CM

## 2018-11-21 DIAGNOSIS — K5730 Diverticulosis of large intestine without perforation or abscess without bleeding: Secondary | ICD-10-CM

## 2018-11-21 DIAGNOSIS — S90221A Contusion of right lesser toe(s) with damage to nail, initial encounter: Secondary | ICD-10-CM

## 2018-11-21 DIAGNOSIS — Z96653 Presence of artificial knee joint, bilateral: Secondary | ICD-10-CM

## 2018-11-21 DIAGNOSIS — Z8639 Personal history of other endocrine, nutritional and metabolic disease: Secondary | ICD-10-CM

## 2018-11-21 DIAGNOSIS — N183 Chronic kidney disease, stage 3 unspecified: Secondary | ICD-10-CM

## 2018-11-21 DIAGNOSIS — Z8719 Personal history of other diseases of the digestive system: Secondary | ICD-10-CM | POA: Diagnosis not present

## 2018-11-21 DIAGNOSIS — M25511 Pain in right shoulder: Secondary | ICD-10-CM | POA: Diagnosis not present

## 2018-11-21 DIAGNOSIS — Z8679 Personal history of other diseases of the circulatory system: Secondary | ICD-10-CM

## 2018-11-21 DIAGNOSIS — M810 Age-related osteoporosis without current pathological fracture: Secondary | ICD-10-CM

## 2018-11-21 DIAGNOSIS — I700 Atherosclerosis of aorta: Secondary | ICD-10-CM

## 2018-11-21 DIAGNOSIS — Z862 Personal history of diseases of the blood and blood-forming organs and certain disorders involving the immune mechanism: Secondary | ICD-10-CM

## 2018-11-21 NOTE — Progress Notes (Signed)
Pharmacy Note  Subjective: Patient presents today to the [facility] to see Dr. Estanislado Pandy.  Patient seen by pharmacist for counseling on bisphosphonate therapy for osteoporosis.  Medication counseling:  T-score: -2.7   Lab Results  Component Value Date   VD25OH 48.0 11/15/2018   CMP     Component Value Date/Time   NA 127 (L) 11/15/2018 1004   K 5.3 (H) 11/15/2018 1004   CL 91 (L) 11/15/2018 1004   CO2 23 11/15/2018 1004   GLUCOSE 115 (H) 11/15/2018 1004   GLUCOSE 101 (H) 11/03/2017 0645   BUN 13 11/15/2018 1004   CREATININE 0.92 11/15/2018 1004   CREATININE 0.87 07/29/2015 0826   CALCIUM 9.8 11/15/2018 1004   PROT 7.3 11/15/2018 1004   ALBUMIN 4.5 11/15/2018 1004   AST 15 11/15/2018 1004   ALT 13 11/15/2018 1004   ALKPHOS 85 11/15/2018 1004   BILITOT 0.5 11/15/2018 1004   GFRNONAA 77 11/15/2018 1004   GFRAA 89 11/15/2018 1004    Counseled patient that Reclast is an IV bisphosphonate that reduces bone turnover by inhibiting osteoclasts that chew up bone.  Counseled patient on purpose, proper use, and adverse effects of Reclast.   Reviewed importance of taking calcium and vitamin D with bisphosphonate therapy. He does not eat dairy regularly.  He is currently on 3 supplements which contain calcium.  Advised patient to discontinue calcium/magnesium/zinc supplement as those are included in his MVI and will decrease calcium intake below the max of 1400 mg daily.    Provided patient with medication education material and answered all questions.  Reviewed adverse events of Reclast including risk of nausea & diarrhea, headache, and muscle & bone pain.  Reviewed rare adverse effect of osteonecrosis of the jaw and advised patient to alert her dentist that she is on Reclast prior to any major dental work.  Patient has upcoming dental procedure on October 7th.  Advised patient that we would need to hold first infusion until after his dental work.  Patient is to call the office  after procedure to proceed with Reclast.  Patient agrees to trial of Reclast at this time post dental procedure. He will need repeat CMP and Vitamin D prior to infusion.  All questions encouraged and answered.  Instructed patient to call with any questions or concerns.  Mariella Saa, PharmD, [place], [place] Clinical Specialty Pharmacist [phone]  11/21/2018 10:12 AM

## 2018-11-22 ENCOUNTER — Telehealth: Payer: Self-pay | Admitting: Family Medicine

## 2018-11-22 MED ORDER — ROSUVASTATIN CALCIUM 5 MG PO TABS: 5.0000 mg | ORAL_TABLET | Freq: Every day | ORAL | 1 refills | Status: DC

## 2018-11-22 NOTE — Telephone Encounter (Signed)
As below- the 5mg  dose - please recall, these meds are to be taken at bedtime Disp 90, 1rf - Will need FLP and ALT in 54months- with ov 3 d later

## 2018-11-22 NOTE — Telephone Encounter (Signed)
Patient called states he tried to respond to [place] from Dr. Jenetta Downer but system would not allow.  --Patient agrees to restart :  ( as advised by provider / Dr.Opalski & other consults)   CRESTOR 5 MG tablet ID:[id number] DISCONTINUED  Order Details Dose, Route, Frequency: As Directed  Note to Pharmacy:  **LABS DUE NOW**      Dispense Quantity: 90 tablet Refills: 0 Fills remaining: --        Sig: TAKE 1 TABLET BY MOUTH EVERY DAY       Discontinue Date:  10/16/2010 09:15 Discontinue User:  Logan Bores, CMA Discontinue Reason:  Duplicate  Written Date: 08/19/10 Expiration Date: 08/19/11    Start Date: 08/19/10 End Date: 10/16/10         Ordering Provider: Hendricks Limes, MD DEA #:  [id number] NPI:  [id number]      --- Forwarding message to medical assistant to send refill order to  :   [facility], [place].  --glh

## 2018-11-22 NOTE — Telephone Encounter (Signed)
RX sent to pharmacy.  Pt aware of need for labs and OV.  Pt transferred to front desk to schedule appts.  Charyl Bigger, CMA

## 2018-11-22 NOTE — Telephone Encounter (Signed)
Pt is willing to restart Crestor.  Please advise on dosage and sig.  Charyl Bigger, CMA

## 2018-11-25 DIAGNOSIS — R931 Abnormal findings on diagnostic imaging of heart and coronary circulation: Secondary | ICD-10-CM | POA: Insufficient documentation

## 2018-11-25 NOTE — Progress Notes (Signed)
Cardiology Office Note   Date:  11/27/2018   ID:  Kyle Simon, DOB 02-19-1933, MRN [id number]  PCP:  Mellody Dance, DO  Cardiologist:   No primary care provider on file. Referring:  Mellody Dance, DO  Chief Complaint  Patient presents with  . Shortness of Breath      History of Present Illness: Kyle Simon is a 85 y.o. male who is referred by Mellody Dance, DO for evaluation of SOB and abnormal EKG.   He had a low risk stress echo in 2012.  He had a distant cardiac cath.   He had dyspnea at the last visit and I sent him for a [employer].  This was negative for ischemia.    Since I last saw him he has done well.  The patient denies any new symptoms such as chest discomfort, neck or arm discomfort. There has been no new shortness of breath, PND or orthopnea. There have been no reported palpitations, presyncope or syncope. He is trying to be   Past Medical History:  Diagnosis Date  . Arthritis    both hands;Dr.Deveshwar  . Constipation due to pain medication   . Diabetes mellitus without complication ([place])    Type II. Uses no medication. Pt controls with diet and weight  . Enlarged prostate   . Frequent urination at night   . GERD (gastroesophageal reflux disease)   . History of noncompliance with medical treatment, presenting hazards to health 11/20/2018  . Hyperlipidemia   . Hyperlipidemia associated with type 2 diabetes mellitus ([place]) 04/19/2007   Qualifier: Diagnosis of  By: Linna Darner MD, Gwyndolyn Saxon    . Hypertension   . Hypothyroidism   . Polio    as a child w/o complications  . Snoring    no sleep apnea  . Statin declined-  11/20/2018   patient understands risks associated with this decision and has been advised to restart by cardiologist as well as myself several times  . Torn meniscus   . Transfusion history 2012   post TKR    Past Surgical History:  Procedure Laterality Date  . ANTERIOR LAT LUMBAR FUSION Right 11/03/2017   Procedure: Right  Lumbar three-four Anterolateral lumbar interbody fusion with lateral plate;  Surgeon: Erline Levine, MD;  Location: [facility];  Service: Neurosurgery;  Laterality: Right;  . BACK SURGERY  2011   hemiarthroplasty 01/11/1999 and 6  . CARDIAC CATHETERIZATION  09/2001   negative  . COLONOSCOPY    . EYE SURGERY  2003   R&L cataract removed & IOL- 2003 & 2007,Dr.Jenkins  . INGUINAL HERNIA REPAIR  2002   right, Dr. Truitt Leep  . JOINT REPLACEMENT  2012   left knee replacement  . LIPOMA EXCISION     back  . MENISCECTOMY Bilateral    Dr. Noemi Chapel  . PROSTATE SURGERY  07/2006   for urinary urgency, Dr. Risa Grill  . ROTATOR CUFF REPAIR Right October, 2016  . sinus & throat surgery-1995  1995  . TONSILLECTOMY    . TOTAL KNEE ARTHROPLASTY  01/11/2011   Procedure: TOTAL KNEE ARTHROPLASTY;  Surgeon: Rudean Haskell, MD;  Location: [facility];  Service: Orthopedics;  Laterality: Left;  . TOTAL KNEE ARTHROPLASTY Right 11/22/2016   Procedure: TOTAL KNEE ARTHROPLASTY;  Surgeon: Vickey Huger, MD;  Location: [facility];  Service: Orthopedics;  Laterality: Right;  . UVULECTOMY  [id number]   Dr. Erik Obey  . VASECTOMY    . WRIST SURGERY  1959   fracture- right  Current Outpatient Medications  Medication Sig Dispense Refill  . acetaminophen (TYLENOL) 500 MG tablet Take 1,000 mg by mouth every 6 (six) hours as needed for mild pain.     Marland Kitchen aspirin 81 MG tablet Take 81 mg by mouth every evening.     . Calcium Carb-Cholecalciferol (CALCIUM 600 + D PO) Take 1 tablet by mouth daily.     Marland Kitchen glucose blood (PRECISION XTRA TEST STRIPS) test strip USE TO CHECK BLOOD SUGAR ONCE DAILY DX E11.9 100 each 2  . Hypromellose (ARTIFICIAL TEARS OP) Place 1 drop into both eyes 2 (two) times daily.    Marland Kitchen LANCETS ULTRA THIN 30G MISC Patient is to use to check blood glucose in the AM fasting and 2 hours after largest meal. 100 each 6  . losartan (COZAAR) 50 MG tablet TAKE 1 TABLET DAILY 90 tablet 1  . Multiple Vitamin (MULTIVITAMIN WITH MINERALS)  TABS Take 1 tablet by mouth daily.    . NONFORMULARY OR COMPOUNDED ITEM Glucometer (Precision XTRA- Model #[id number]) 1 each 0  . Omega-3 Fatty Acids (FISH OIL) 1000 MG CAPS Take 1,000 mg by mouth every evening.    . Probiotic Product (RESTORA) CAPS One tab qd (Patient taking differently: Take 1 capsule by mouth every evening. ) 90 capsule 3  . rosuvastatin (CRESTOR) 5 MG tablet Take 1 tablet (5 mg total) by mouth at bedtime. 90 tablet 1  . SYNTHROID 150 MCG tablet TAKE 1 TABLET DAILY BEFORE BREAKFAST 90 tablet 4  . Tamsulosin HCl (FLOMAX) 0.4 MG CAPS Take 0.4 mg by mouth 2 (two) times daily.     . vitamin B-12 (CYANOCOBALAMIN) 1000 MCG tablet Take 1,000 mcg by mouth daily.     No current facility-administered medications for this visit.     Allergies:   Simvastatin and Oxycodone    ROS:  Please see the history of present illness.   Otherwise, review of systems are positive for none.   All other systems are reviewed and negative.    PHYSICAL EXAM: VS:  BP 138/75   Pulse (!) 49   Ht 5' 8.5" (1.74 m)   Wt 194 lb (88 kg)   BMI 29.07 kg/m  , BMI Body mass index is 29.07 kg/m. GENERAL:  Well appearing NECK:  No jugular venous distention, waveform within normal limits, carotid upstroke brisk and symmetric, no bruits, no thyromegaly LUNGS:  Clear to auscultation bilaterally CHEST:  Unremarkable HEART:  PMI not displaced or sustained,S1 and S2 within normal limits, no S3, no S4, no clicks, no rubs, no murmurs ABD:  Flat, positive bowel sounds normal in frequency in pitch, no bruits, no rebound, no guarding, no midline pulsatile mass, no hepatomegaly, no splenomegaly EXT:  2 plus pulses throughout, no edema, no cyanosis no clubbing   EKG:  EKG is  ordered today. The ekg ordered today demonstrates sinus rhythm, rate 49, axis within normal limits, intervals within normal limits, premature ventricular contractions.   Recent Labs: 11/15/2018: ALT 13; BUN 13; Creatinine, Ser 0.92;  Hemoglobin 13.4; Platelets 251; Potassium 5.3; Sodium 127; TSH 4.910    Lipid Panel    Component Value Date/Time   CHOL 185 11/15/2018 1004   TRIG 113 11/15/2018 1004   HDL 43 11/15/2018 1004   CHOLHDL 4.3 11/15/2018 1004   CHOLHDL 2.6 07/29/2015 0826   VLDL 20 07/29/2015 0826   LDLCALC 122 (H) 11/15/2018 1004   LDLDIRECT 121 (H) 11/15/2018 1010   LDLDIRECT 61.6 04/25/2013 1431  Wt Readings from Last 3 Encounters:  11/27/18 194 lb (88 kg)  11/21/18 193 lb (87.5 kg)  11/15/18 196 lb 12.8 oz (89.3 kg)      Other studies Reviewed: Additional studies/ records that were reviewed today include:   Review of the above records demonstrates:  Please see elsewhere in the note.     ASSESSMENT AND PLAN:  SOB:  Lexiscan Myoview was negative. He has had no new symptoms.  No change in therapy.  Call Kyle Simon with the results and send results to Mellody Dance, DO  HTN:  The blood pressure is at target. No change in medications is indicated. We will continue with therapeutic lifestyle changes (TLC).  CORONARY CALCIUM:  He has had no new symptoms since his perfusion study.  No further work up.   AORTIC ATHEROSCLEROSIS:     We will continue with risk reduction.   DYSLIPIDEMIA:    He has had mildly increased LDL and was started on Crestor.     Current medicines are reviewed at length with the patient today.  The patient does not have concerns regarding medicines.  The following changes have been made:    Labs/ tests ordered today include: None  Orders Placed This Encounter  Procedures  . EKG 12-Lead     Disposition:   FU with me in one year at his request.    Signed, Minus Breeding, MD  11/27/2018 6:07 PM    [place]

## 2018-11-27 ENCOUNTER — Other Ambulatory Visit: Payer: Self-pay

## 2018-11-27 ENCOUNTER — Encounter: Payer: Self-pay | Admitting: Cardiology

## 2018-11-27 ENCOUNTER — Ambulatory Visit (INDEPENDENT_AMBULATORY_CARE_PROVIDER_SITE_OTHER): Payer: Medicare Other | Admitting: Cardiology

## 2018-11-27 VITALS — BP 138/75 | HR 49 | Ht 68.5 in | Wt 194.0 lb

## 2018-11-27 DIAGNOSIS — I10 Essential (primary) hypertension: Secondary | ICD-10-CM | POA: Diagnosis not present

## 2018-11-27 DIAGNOSIS — I700 Atherosclerosis of aorta: Secondary | ICD-10-CM

## 2018-11-27 DIAGNOSIS — R0602 Shortness of breath: Secondary | ICD-10-CM

## 2018-11-27 DIAGNOSIS — R931 Abnormal findings on diagnostic imaging of heart and coronary circulation: Secondary | ICD-10-CM | POA: Diagnosis not present

## 2018-11-27 NOTE — Patient Instructions (Signed)
Medication Instructions:  Your physician recommends that you continue on your current medications as directed. Please refer to the Current Medication list given to you today.  If you need a refill on your cardiac medications before your next appointment, please call your pharmacy.   Lab work: NONE  Testing/Procedures: NONE  Follow-Up: At [facility], you and your health needs are our priority.  As part of our continuing mission to provide you with exceptional heart care, we have created designated Provider Care Teams.  These Care Teams include your primary Cardiologist (physician) and Advanced Practice Providers (APPs -  Physician Assistants and Nurse Practitioners) who all work together to provide you with the care you need, when you need it. You will need a follow up appointment in 12 months.  Please call our office 2 months in advance to schedule this appointment.  You may see Dr. Hochrein or one of the following Advanced Practice Providers on your designated Care Team:   Rhonda Barrett, PA-C Kathryn Lawrence, DNP, ANP      

## 2018-11-28 ENCOUNTER — Encounter: Payer: Self-pay | Admitting: Gastroenterology

## 2018-11-28 ENCOUNTER — Ambulatory Visit (INDEPENDENT_AMBULATORY_CARE_PROVIDER_SITE_OTHER): Payer: Medicare Other | Admitting: Gastroenterology

## 2018-11-28 VITALS — BP 136/72 | HR 58 | Temp 98.1°F | Ht 70.0 in | Wt 194.6 lb

## 2018-11-28 DIAGNOSIS — K5900 Constipation, unspecified: Secondary | ICD-10-CM | POA: Diagnosis not present

## 2018-11-28 DIAGNOSIS — R931 Abnormal findings on diagnostic imaging of heart and coronary circulation: Secondary | ICD-10-CM

## 2018-11-28 DIAGNOSIS — R143 Flatulence: Secondary | ICD-10-CM | POA: Diagnosis not present

## 2018-11-28 NOTE — Patient Instructions (Signed)
If you are age 83 or older, your body mass index should be between 23-30. Your Body mass index is 27.92 kg/m. If this is out of the aforementioned range listed, please consider follow up with your Primary Care Provider.  If you are age 64 or younger, your body mass index should be between 19-25. Your Body mass index is 27.92 kg/m. If this is out of the aformentioned range listed, please consider follow up with your Primary Care Provider.   You have been given samples of IBgard.  Take as directed.  Continue Miralax daily.  May use Gas-X, Beano, any product containing simethicone.  Thank you for choosing me and [facility].   Alonza Bogus, PA-C

## 2018-11-29 ENCOUNTER — Encounter: Payer: Self-pay | Admitting: Gastroenterology

## 2018-11-29 DIAGNOSIS — R143 Flatulence: Secondary | ICD-10-CM | POA: Insufficient documentation

## 2018-11-29 NOTE — Progress Notes (Signed)
11/29/2018 HINSON VLASIC [id number] 04-Jan-1933   HISTORY OF PRESENT ILLNESS: This is a pleasant 85 year old male who is known to Dr. Ardis Hughs only for previous colonoscopy.  Colonoscopy with Dr. Ardis Hughs in March 2014 showed only diverticulosis in the left colon.  Prior to that he had one in 2004 that was also normal.  He is here today with complaints of constipation and gas/borborygmi.  He says that he had developed some mild abdominal pain along with the constipation.  His PCP advised that he start taking MiraLAX daily.  He has been taking that and has not had any further issues with the abdominal pain.  He is moving his bowels well.  The MiraLAX obviously has not had any impact on his gas.  He wanted to be sure that we thought it was safe for him to take the MiraLAX continuously and to see if we had any recommendations in regards to his flatulence complaints.  Denies rectal bleeding.  TSH is monitored since he is on medication.   Past Medical History:  Diagnosis Date  . Arthritis    both hands;Dr.Deveshwar  . Constipation due to pain medication   . Diabetes mellitus without complication ([place])    Type II. Uses no medication. Pt controls with diet and weight  . Enlarged prostate   . Frequent urination at night   . GERD (gastroesophageal reflux disease)   . History of noncompliance with medical treatment, presenting hazards to health 11/20/2018  . Hyperlipidemia   . Hyperlipidemia associated with type 2 diabetes mellitus ([place]) 04/19/2007   Qualifier: Diagnosis of  By: Linna Darner MD, Gwyndolyn Saxon    . Hypertension   . Hypothyroidism   . Polio    as a child w/o complications  . Snoring    no sleep apnea  . Statin declined-  11/20/2018   patient understands risks associated with this decision and has been advised to restart by cardiologist as well as myself several times  . Torn meniscus   . Transfusion history 2012   post TKR   Past Surgical History:  Procedure Laterality Date  .  ANTERIOR LAT LUMBAR FUSION Right 11/03/2017   Procedure: Right Lumbar three-four Anterolateral lumbar interbody fusion with lateral plate;  Surgeon: Erline Levine, MD;  Location: [facility];  Service: Neurosurgery;  Laterality: Right;  . BACK SURGERY  2011   hemiarthroplasty 01/11/1999 and 6  . CARDIAC CATHETERIZATION  09/2001   negative  . COLONOSCOPY    . EYE SURGERY  2003   R&L cataract removed & IOL- 2003 & 2007,Dr.Jenkins  . INGUINAL HERNIA REPAIR  2002   right, Dr. Truitt Leep  . JOINT REPLACEMENT  2012   left knee replacement  . LIPOMA EXCISION     back  . MENISCECTOMY Bilateral    Dr. Noemi Chapel  . PROSTATE SURGERY  07/2006   for urinary urgency, Dr. Risa Grill  . ROTATOR CUFF REPAIR Right October, 2016  . sinus & throat surgery-1995  1995  . TONSILLECTOMY    . TOTAL KNEE ARTHROPLASTY  01/11/2011   Procedure: TOTAL KNEE ARTHROPLASTY;  Surgeon: Rudean Haskell, MD;  Location: [facility];  Service: Orthopedics;  Laterality: Left;  . TOTAL KNEE ARTHROPLASTY Right 11/22/2016   Procedure: TOTAL KNEE ARTHROPLASTY;  Surgeon: Vickey Huger, MD;  Location: [facility];  Service: Orthopedics;  Laterality: Right;  . UVULECTOMY  [id number]   Dr. Erik Obey  . VASECTOMY    . WRIST SURGERY  1959   fracture- right    reports  that he quit smoking about 52 years ago. He has a 18.00 pack-year smoking history. He has never used smokeless tobacco. He reports current alcohol use of about 4.0 standard drinks of alcohol per week. He reports that he does not use drugs. family history includes Asthma in his brother; Bone cancer in his brother; Coronary artery disease in his mother; Diabetes in his paternal grandmother; Diverticulosis in his mother; Emphysema in his mother; Sarcoidosis in his son; Sudden death in his father; Thyroid disease in his daughter and mother. Allergies  Allergen Reactions  . Simvastatin Other (See Comments)    MYALGIAS  . Oxycodone Other (See Comments)    Severe constipation      Outpatient Encounter  Medications as of 11/28/2018  Medication Sig  . acetaminophen (TYLENOL) 500 MG tablet Take 1,000 mg by mouth every 6 (six) hours as needed for mild pain.   Marland Kitchen aspirin 81 MG tablet Take 81 mg by mouth every evening.   . Calcium Carb-Cholecalciferol (CALCIUM 600 + D PO) Take 1 tablet by mouth daily.   Marland Kitchen glucose blood (PRECISION XTRA TEST STRIPS) test strip USE TO CHECK BLOOD SUGAR ONCE DAILY DX E11.9  . Hypromellose (ARTIFICIAL TEARS OP) Place 1 drop into both eyes 2 (two) times daily.  Marland Kitchen LANCETS ULTRA THIN 30G MISC Patient is to use to check blood glucose in the AM fasting and 2 hours after largest meal.  . losartan (COZAAR) 50 MG tablet TAKE 1 TABLET DAILY  . Multiple Vitamin (MULTIVITAMIN WITH MINERALS) TABS Take 1 tablet by mouth daily.  . NONFORMULARY OR COMPOUNDED ITEM Glucometer Museum/gallery cur[employer] XTRA- Model #XCC [id number])  . Omega-3 Fatty Acids (FISH OIL) 1000 MG CAPS Take 1,000 mg by mouth every evening.  . Probiotic Product (RESTORA) CAPS One tab qd (Patient taking differently: Take 1 capsule by mouth every evening. )  . rosuvastatin (CRESTOR) 5 MG tablet Take 1 tablet (5 mg total) by mouth at bedtime.  Marland Kitchen SYNTHROID 150 MCG tablet TAKE 1 TABLET DAILY BEFORE BREAKFAST  . Tamsulosin HCl (FLOMAX) 0.4 MG CAPS Take 0.4 mg by mouth 2 (two) times daily.   . vitamin B-12 (CYANOCOBALAMIN) 1000 MCG tablet Take 1,000 mcg by mouth daily.   No facility-administered encounter medications on file as of 11/28/2018.      REVIEW OF SYSTEMS  : All other systems reviewed and negative except where noted in the History of Present Illness.   PHYSICAL EXAM: BP 136/72   Pulse (!) 58   Temp 98.1 F (36.7 C)   Ht 5\' 10"  (1.778 m)   Wt 194 lb 9.6 oz (88.3 kg)   BMI 27.92 kg/m  General: Well developed white male in no acute distress Head: Normocephalic and atraumatic Eyes:  Sclerae anicteric, conjunctiva pink. Ears: Normal auditory acuity Lungs: Clear throughout to auscultation; no increased WOB. Heart:  Regular rate and rhythm; no M/R/G. Abdomen: Soft, non-distended.  BS present.  Non-tender. Musculoskeletal: Symmetrical with no gross deformities  Skin: No lesions on visible extremities Extremities: No edema  Neurological: Alert oriented x 4, grossly non-focal Psychological:  Alert and cooperative. Normal mood and affect  ASSESSMENT AND PLAN: *85 year old male with constipation and complaints of gas/flatulence/borborygmi.  He began taking MiraLAX daily at the recommendation of his PCP and is doing well, but wanted our opinion to be sure that it is safe to continue using that on a daily basis.  I gave him reassurance and told him he could absolutely continue that so he agreed to do so.  We discussed using Gas-X, Beano, or any other simethicone-containing product for this issue.  I am also going to give him some samples of IBgard to try as well.   CC:  Mellody Dance, DO

## 2018-11-30 NOTE — Progress Notes (Signed)
I agree with the above note, plan 

## 2018-12-10 NOTE — Progress Notes (Signed)
Patient ID: Kyle Simon, male   DOB: 1932-04-10, 85 y.o.   MRN: [id number]           Referring Physician: Raliegh Scarlet  Reason for Appointment:  Hypothyroidism,  follow-up visit    History of Present Illness:   Hypothyroidism was first diagnosed  10-15 years ago   At the time of diagnosis patient was apparently not having symptoms of  fatigue, cold sensitivity, difficulty concentrating, dry skin, weight gain  He thinks his primary care physician may have done routine testing and told him that he was hypothyroid and started him on unknown dose of levothyroxine He did not start feeling any different when he started taking levothyroxine initially Also usually does not feel any different with dosage adjustments that have been made Apparently his dose was 150 g for some time but this was reduced in 06/2015 when his TSH was low normal Since then he has been taking 150 g 5 days a week and 125 g twice a week Non-insulin initial consultation he was complaining that since spring time his energy level has been not as good and he has not been able to do as much with activities as he used to   He was referred here because his TSH is tending to be high but also his free T4 was relatively higher in 7/18  Recent history: On his initial consultation since his TSH was still relatively high and he was taking the equivalent of about 142 g of levothyroxine daily he was switched to taking 150 g once daily Also he was told to not take his levothyroxine at bedtime but take it before breakfast   Subsequently his TSH had been back to normal and he was told to follow-up with his PCP He is taking his levothyroxine now regularly every morning However it appears that he is taking his multivitamin at the same time which contains some calcium  He does not think he has had any fatigue, lethargy, sleepiness, cold intolerance or weight gain  TSH the last 2 measurements has been consistently higher between about  5-7 Free T4 was high in 9/20, he does take a multivitamin with unknown biotin content   Patient's weight history is as follows:  Wt Readings from Last 3 Encounters:  12/11/18 193 lb 9.6 oz (87.8 kg)  11/28/18 194 lb 9.6 oz (88.3 kg)  11/27/18 194 lb (88 kg)    Thyroid function results have been as follows:  Lab Results  Component Value Date   TSH 4.910 (H) 11/15/2018   TSH 6.950 (H) 08/22/2017   TSH 2.87 04/26/2017   TSH 3.85 12/28/2016   FREET4 1.81 (H) 11/15/2018   FREET4 1.45 08/22/2017   FREET4 1.02 04/26/2017   FREET4 1.27 12/28/2016   T3FREE 3.2 12/28/2016   T3FREE 2.7 10/17/2015    Lab Results  Component Value Date   TSH 4.910 (H) 11/15/2018   TSH 6.950 (H) 08/22/2017   TSH 2.87 04/26/2017   TSH 3.85 12/28/2016   TSH 5.860 (H) 09/15/2016   TSH 5.070 (H) 06/22/2016   TSH 2.19 02/04/2016   TSH 4.31 12/16/2015   TSH 3.45 10/17/2015     Past Medical History:  Diagnosis Date  . Arthritis    both hands;Dr.Deveshwar  . Constipation due to pain medication   . Diabetes mellitus without complication ([place])    Type II. Uses no medication. Pt controls with diet and weight  . Enlarged prostate   . Frequent urination at night   . GERD (  gastroesophageal reflux disease)   . History of noncompliance with medical treatment, presenting hazards to health 11/20/2018  . Hyperlipidemia   . Hyperlipidemia associated with type 2 diabetes mellitus ([place]) 04/19/2007   Qualifier: Diagnosis of  By: Linna Darner MD, Gwyndolyn Saxon    . Hypertension   . Hypothyroidism   . Polio    as a child w/o complications  . Snoring    no sleep apnea  . Statin declined-  11/20/2018   patient understands risks associated with this decision and has been advised to restart by cardiologist as well as myself several times  . Torn meniscus   . Transfusion history 2012   post TKR    Past Surgical History:  Procedure Laterality Date  . ANTERIOR LAT LUMBAR FUSION Right 11/03/2017   Procedure: Right Lumbar  three-four Anterolateral lumbar interbody fusion with lateral plate;  Surgeon: Erline Levine, MD;  Location: [facility];  Service: Neurosurgery;  Laterality: Right;  . BACK SURGERY  2011   hemiarthroplasty 01/11/1999 and 6  . CARDIAC CATHETERIZATION  09/2001   negative  . COLONOSCOPY    . EYE SURGERY  2003   R&L cataract removed & IOL- 2003 & 2007,Dr.Jenkins  . INGUINAL HERNIA REPAIR  2002   right, Dr. Truitt Leep  . JOINT REPLACEMENT  2012   left knee replacement  . LIPOMA EXCISION     back  . MENISCECTOMY Bilateral    Dr. Noemi Chapel  . PROSTATE SURGERY  07/2006   for urinary urgency, Dr. Risa Grill  . ROTATOR CUFF REPAIR Right October, 2016  . sinus & throat surgery-1995  1995  . TONSILLECTOMY    . TOTAL KNEE ARTHROPLASTY  01/11/2011   Procedure: TOTAL KNEE ARTHROPLASTY;  Surgeon: Rudean Haskell, MD;  Location: [facility];  Service: Orthopedics;  Laterality: Left;  . TOTAL KNEE ARTHROPLASTY Right 11/22/2016   Procedure: TOTAL KNEE ARTHROPLASTY;  Surgeon: Vickey Huger, MD;  Location: [facility];  Service: Orthopedics;  Laterality: Right;  . UVULECTOMY  [id number]   Dr. Erik Obey  . VASECTOMY    . WRIST SURGERY  1959   fracture- right    Family History  Problem Relation Age of Onset  . Sudden death Father        MVA  . Coronary artery disease Mother   . Emphysema Mother   . Diverticulosis Mother   . Thyroid disease Mother   . Asthma Brother   . Bone cancer Brother   . Diabetes Paternal Grandmother   . Sarcoidosis Son   . Thyroid disease Daughter   . Anesthesia problems Neg Hx   . Hypotension Neg Hx   . Malignant hyperthermia Neg Hx   . Pseudochol deficiency Neg Hx   . Colon cancer Neg Hx     Social History:  reports that he quit smoking about 52 years ago. He has a 18.00 pack-year smoking history. He has never used smokeless tobacco. He reports current alcohol use of about 4.0 standard drinks of alcohol per week. He reports that he does not use drugs.  Allergies:  Allergies  Allergen Reactions   . Simvastatin Other (See Comments)    MYALGIAS  . Oxycodone Other (See Comments)    Severe constipation    Allergies as of 12/11/2018      Reactions   Simvastatin Other (See Comments)   MYALGIAS   Oxycodone Other (See Comments)   Severe constipation      Medication List       Accurate as of December 11, 2018  3:15 PM. If you have any questions, ask your nurse or doctor.        acetaminophen 500 MG tablet Commonly known as: TYLENOL Take 1,000 mg by mouth every 6 (six) hours as needed for mild pain.   ARTIFICIAL TEARS OP Place 1 drop into both eyes 2 (two) times daily.   aspirin 81 MG tablet Take 81 mg by mouth every evening.   CALCIUM 600 + D PO Take 1 tablet by mouth daily.   Fish Oil 1000 MG Caps Take 1,000 mg by mouth every evening.   Flomax 0.4 MG Caps capsule Generic drug: tamsulosin Take 0.4 mg by mouth 2 (two) times daily.   glucose blood test strip Commonly known as: PRECISION XTRA TEST STRIPS USE TO CHECK BLOOD SUGAR ONCE DAILY DX E11.9   Lancets Ultra Thin 30G Misc Patient is to use to check blood glucose in the AM fasting and 2 hours after largest meal.   losartan 50 MG tablet Commonly known as: COZAAR TAKE 1 TABLET DAILY   multivitamin with minerals Tabs tablet Take 1 tablet by mouth daily.   NONFORMULARY OR COMPOUNDED ITEM Glucometer (Precision XTRA- Model #[id number])   Restora Caps One tab qd What changed:   how much to take  how to take this  when to take this  additional instructions   rosuvastatin 5 MG tablet Commonly known as: Crestor Take 1 tablet (5 mg total) by mouth at bedtime.   Synthroid 150 MCG tablet Generic drug: levothyroxine TAKE 1 TABLET DAILY BEFORE BREAKFAST   vitamin B-12 1000 MCG tablet Commonly known as: CYANOCOBALAMIN Take 1,000 mcg by mouth daily.          Review of Systems  HYPONATREMIA: Serum sodium apparently has been low for quite some time but recently unusually low at 127 He thinks  he is drinking more water lately because of constipation Not on any thiazide diuretics or SSRI drugs No recent unusual weight loss  Lab Results  Component Value Date   NA 127 (L) 11/15/2018   K 5.3 (H) 11/15/2018   CL 91 (L) 11/15/2018   CO2 23 11/15/2018   Wt Readings from Last 3 Encounters:  12/11/18 193 lb 9.6 oz (87.8 kg)  11/28/18 194 lb 9.6 oz (88.3 kg)  11/27/18 194 lb (88 kg)               Examination:    BP 134/64 (BP Location: Left Arm, Patient Position: Sitting, Cuff Size: Normal)   Pulse (!) 55   Ht 5\' 10"  (1.778 m)   Wt 193 lb 9.6 oz (87.8 kg)   BMI 27.78 kg/m       Assessment:  HYPOTHYROIDISM, normal bili autoimmune with no associated goiter  He has had recently mildly abnormal thyroid levels with taking 150 mcg levothyroxine with higher TSH levels compared to last He is however asymptomatic As discussed above his likely having some interaction with taking his multivitamin at the same time Free T4 being slightly high is likely to be from biotin and his multivitamin although this is not confirmed  Otherwise he is compliant with taking levothyroxine in the morning daily before breakfast as directed  Discussed that calcium and iron supplements will interact with levothyroxine  HYPONATREMIA: This is of concern since sodium is lower than usual We will need to recheck this today 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 Any transcriptional errors that result from this process are unintentional.

## 2018-12-11 ENCOUNTER — Ambulatory Visit (INDEPENDENT_AMBULATORY_CARE_PROVIDER_SITE_OTHER): Payer: Medicare Other | Admitting: Endocrinology

## 2018-12-11 ENCOUNTER — Other Ambulatory Visit: Payer: Self-pay

## 2018-12-11 ENCOUNTER — Encounter: Payer: Self-pay | Admitting: Endocrinology

## 2018-12-11 VITALS — BP 134/64 | HR 55 | Ht 70.0 in | Wt 193.6 lb

## 2018-12-11 DIAGNOSIS — R931 Abnormal findings on diagnostic imaging of heart and coronary circulation: Secondary | ICD-10-CM | POA: Diagnosis not present

## 2018-12-11 DIAGNOSIS — E871 Hypo-osmolality and hyponatremia: Secondary | ICD-10-CM

## 2018-12-11 DIAGNOSIS — E875 Hyperkalemia: Secondary | ICD-10-CM

## 2018-12-11 DIAGNOSIS — E063 Autoimmune thyroiditis: Secondary | ICD-10-CM

## 2018-12-11 NOTE — Patient Instructions (Signed)
Take Vitamin at dinner

## 2018-12-12 LAB — BASIC METABOLIC PANEL
BUN: 14 mg/dL (ref 6–23)
CO2: 25 mEq/L (ref 19–32)
Calcium: 9.4 mg/dL (ref 8.4–10.5)
Chloride: 96 mEq/L (ref 96–112)
Creatinine, Ser: 0.93 mg/dL (ref 0.40–1.50)
GFR: 77.44 mL/min (ref >60.00)
Glucose, Bld: 116 mg/dL — ABNORMAL HIGH (ref 70–99)
Potassium: 4.9 mEq/L (ref 3.5–5.1)
Sodium: 128 mEq/L — ABNORMAL LOW (ref 135–145)

## 2018-12-12 LAB — TSH: TSH: 3.09 u[IU]/mL (ref 0.35–4.50)

## 2018-12-12 LAB — CORTISOL: Cortisol, Plasma: 4.8 ug/dL

## 2018-12-13 ENCOUNTER — Telehealth: Payer: Self-pay

## 2018-12-13 DIAGNOSIS — E222 Syndrome of inappropriate secretion of antidiuretic hormone: Secondary | ICD-10-CM

## 2018-12-13 LAB — OSMOLALITY, URINE: Osmolality, Ur: 442 mOsmol/kg

## 2018-12-13 NOTE — Telephone Encounter (Signed)
-----   Message from Mellody Dance, DO sent at 12/13/2018  3:47 PM EDT ----- Patient will need a chest x-ray due to diagnosis of SIADH.  Please order this (to be done 3 days prior to follow-up appointment )  so that can be resulted prior to his follow-up appointment with me.   Please make follow-up appointment with me next available

## 2018-12-28 ENCOUNTER — Telehealth: Payer: Self-pay | Admitting: Pharmacist

## 2018-12-28 NOTE — Telephone Encounter (Signed)
Patient is to be starting Reclast for osteoporosis.  He was to have dental work on October 7th.  Called patient to follow up.  He states he had difficulty with his procedure and he is to have a permanent bridge placed next week. He states he will follow up in 2 weeks with our office.  He also reports that he was able to make adjustments to his calcium supplements that we recommended at last visit.  All questions encouraged and answered.  Instructed patient to call with any other questions or concerns.  Mariella Saa, PharmD, [place], [place] [facility] Pharmacist [phone]  12/28/2018 3:57 PM

## 2018-12-28 NOTE — Telephone Encounter (Signed)
-----   Message from [employer], [facility] sent at 11/21/2018 10:15 AM EDT ----- Regarding: Reclast Call to discuss starting REclast after dental procedure.  He will need update CMP and vitamin D.

## 2019-01-15 ENCOUNTER — Other Ambulatory Visit: Payer: Self-pay

## 2019-01-15 DIAGNOSIS — Z20828 Contact with and (suspected) exposure to other viral communicable diseases: Secondary | ICD-10-CM | POA: Diagnosis not present

## 2019-01-15 DIAGNOSIS — Z20822 Contact with and (suspected) exposure to covid-19: Secondary | ICD-10-CM

## 2019-01-17 LAB — NOVEL CORONAVIRUS, NAA: SARS-CoV-2, NAA: NOT DETECTED

## 2019-01-23 ENCOUNTER — Ambulatory Visit (INDEPENDENT_AMBULATORY_CARE_PROVIDER_SITE_OTHER): Payer: Medicare Other | Admitting: Endocrinology

## 2019-01-23 ENCOUNTER — Ambulatory Visit
Admission: RE | Admit: 2019-01-23 | Discharge: 2019-01-23 | Disposition: A | Discharge status: Home / Self Care | Payer: Medicare Other | Source: Ambulatory Visit | Attending: Endocrinology | Admitting: Endocrinology

## 2019-01-23 ENCOUNTER — Encounter: Payer: Self-pay | Admitting: Endocrinology

## 2019-01-23 ENCOUNTER — Other Ambulatory Visit: Payer: Self-pay

## 2019-01-23 VITALS — BP 130/68 | HR 44 | Ht 70.0 in | Wt 196.0 lb

## 2019-01-23 DIAGNOSIS — E871 Hypo-osmolality and hyponatremia: Secondary | ICD-10-CM | POA: Diagnosis not present

## 2019-01-23 DIAGNOSIS — E222 Syndrome of inappropriate secretion of antidiuretic hormone: Secondary | ICD-10-CM

## 2019-01-23 DIAGNOSIS — R931 Abnormal findings on diagnostic imaging of heart and coronary circulation: Secondary | ICD-10-CM | POA: Diagnosis not present

## 2019-01-23 LAB — COMPREHENSIVE METABOLIC PANEL
ALT: 15 U/L (ref 0–53)
AST: 18 U/L (ref 0–37)
Albumin: 3.9 g/dL (ref 3.5–5.2)
Alkaline Phosphatase: 67 U/L (ref 39–117)
BUN: 11 mg/dL (ref 6–23)
CO2: 27 mEq/L (ref 19–32)
Calcium: 9.3 mg/dL (ref 8.4–10.5)
Chloride: 94 mEq/L — ABNORMAL LOW (ref 96–112)
Creatinine, Ser: 0.86 mg/dL (ref 0.40–1.50)
GFR: 84.74 mL/min (ref >60.00)
Glucose, Bld: 113 mg/dL — ABNORMAL HIGH (ref 70–99)
Potassium: 4.7 mEq/L (ref 3.5–5.1)
Sodium: 126 mEq/L — ABNORMAL LOW (ref 135–145)
Total Bilirubin: 0.7 mg/dL (ref 0.2–1.2)
Total Protein: 6.9 g/dL (ref 6.0–8.3)

## 2019-01-23 NOTE — Progress Notes (Signed)
Patient ID: Kyle Simon, male   DOB: Nov 17, 1932, 85 y.o.   MRN: [id number]           Referring Physician: Raliegh Scarlet  Reason for Appointment:  follow-up visit    History of Present Illness:   HYPONATREMIA:  HYPONATREMIA: Serum sodium has been low for quite some time but recently as low as 127 He has not had any symptoms of decreased appetite, nausea or drowsiness Not on any thiazide diuretics or SSRI drugs No recent unusual weight loss  He tends to drink a lot of water partly for his constipation His urine osmolality was high at 442 He was told to cut back on his fluid intake and is trying to do so Again feels fairly good His PCP asked him to get his chest x-ray done but he has not done so  Repeat labs pending  Lab Results  Component Value Date   NA 128 (L) 12/11/2018   K 4.9 12/11/2018   CL 96 12/11/2018   CO2 25 12/11/2018     The following is a copy of the previous note  Hypothyroidism was first diagnosed  10-15 years ago   At the time of diagnosis patient was apparently not having symptoms of  fatigue, cold sensitivity, difficulty concentrating, dry skin, weight gain  He thinks his primary care physician may have done routine testing and told him that he was hypothyroid and started him on unknown dose of levothyroxine He did not start feeling any different when he started taking levothyroxine initially Also usually does not feel any different with dosage adjustments that have been made Apparently his dose was 150 g for some time but this was reduced in 06/2015 when his TSH was low normal Since then he has been taking 150 g 5 days a week and 125 g twice a week Non-insulin initial consultation he was complaining that since spring time his energy level has been not as good and he has not been able to do as much with activities as he used to   He was referred here because his TSH is tending to be high but also his free T4 was relatively higher in 7/18  Recent  history: On his initial consultation since his TSH was still relatively high and he was taking the equivalent of about 142 g of levothyroxine daily he was switched to taking 150 g once daily Also he was told to not take his levothyroxine at bedtime but take it before breakfast   Subsequently his TSH had been back to normal and he was told to follow-up with his PCP He is taking his levothyroxine now regularly every morning However it appears that he is taking his multivitamin at the same time which contains some calcium  He does not think he has had any fatigue, lethargy, sleepiness, cold intolerance or weight gain  TSH the last 2 measurements has been consistently higher between about 5-7 Free T4 was high in 9/20, he does take a multivitamin with unknown biotin content   Patient's weight history is as follows:  Wt Readings from Last 3 Encounters:  01/23/19 196 lb (88.9 kg)  12/11/18 193 lb 9.6 oz (87.8 kg)  11/28/18 194 lb 9.6 oz (88.3 kg)    Thyroid function results have been as follows:  Lab Results  Component Value Date   TSH 3.09 12/11/2018   TSH 4.910 (H) 11/15/2018   TSH 6.950 (H) 08/22/2017   TSH 2.87 04/26/2017   FREET4 1.81 (H) 11/15/2018  FREET4 1.45 08/22/2017   FREET4 1.02 04/26/2017   FREET4 1.27 12/28/2016   T3FREE 3.2 12/28/2016   T3FREE 2.7 10/17/2015    Lab Results  Component Value Date   TSH 3.09 12/11/2018   TSH 4.910 (H) 11/15/2018   TSH 6.950 (H) 08/22/2017   TSH 2.87 04/26/2017   TSH 3.85 12/28/2016   TSH 5.860 (H) 09/15/2016   TSH 5.070 (H) 06/22/2016   TSH 2.19 02/04/2016   TSH 4.31 12/16/2015     Past Medical History:  Diagnosis Date  . Arthritis    both hands;Dr.Deveshwar  . Constipation due to pain medication   . Diabetes mellitus without complication ([place])    Type II. Uses no medication. Pt controls with diet and weight  . Enlarged prostate   . Frequent urination at night   . GERD (gastroesophageal reflux disease)   .  History of noncompliance with medical treatment, presenting hazards to health 11/20/2018  . Hyperlipidemia   . Hyperlipidemia associated with type 2 diabetes mellitus ([place]) 04/19/2007   Qualifier: Diagnosis of  By: Linna Darner MD, Gwyndolyn Saxon    . Hypertension   . Hypothyroidism   . Polio    as a child w/o complications  . Snoring    no sleep apnea  . Statin declined-  11/20/2018   patient understands risks associated with this decision and has been advised to restart by cardiologist as well as myself several times  . Torn meniscus   . Transfusion history 2012   post TKR    Past Surgical History:  Procedure Laterality Date  . ANTERIOR LAT LUMBAR FUSION Right 11/03/2017   Procedure: Right Lumbar three-four Anterolateral lumbar interbody fusion with lateral plate;  Surgeon: Erline Levine, MD;  Location: [facility];  Service: Neurosurgery;  Laterality: Right;  . BACK SURGERY  2011   hemiarthroplasty 01/11/1999 and 6  . CARDIAC CATHETERIZATION  09/2001   negative  . COLONOSCOPY    . EYE SURGERY  2003   R&L cataract removed & IOL- 2003 & 2007,Dr.Jenkins  . INGUINAL HERNIA REPAIR  2002   right, Dr. Truitt Leep  . JOINT REPLACEMENT  2012   left knee replacement  . LIPOMA EXCISION     back  . MENISCECTOMY Bilateral    Dr. Noemi Chapel  . PROSTATE SURGERY  07/2006   for urinary urgency, Dr. Risa Grill  . ROTATOR CUFF REPAIR Right October, 2016  . sinus & throat surgery-1995  1995  . TONSILLECTOMY    . TOTAL KNEE ARTHROPLASTY  01/11/2011   Procedure: TOTAL KNEE ARTHROPLASTY;  Surgeon: Rudean Haskell, MD;  Location: [facility];  Service: Orthopedics;  Laterality: Left;  . TOTAL KNEE ARTHROPLASTY Right 11/22/2016   Procedure: TOTAL KNEE ARTHROPLASTY;  Surgeon: Vickey Huger, MD;  Location: [facility];  Service: Orthopedics;  Laterality: Right;  . UVULECTOMY  [id number]   Dr. Erik Obey  . VASECTOMY    . WRIST SURGERY  1959   fracture- right    Family History  Problem Relation Age of Onset  . Sudden death Father        MVA   . Coronary artery disease Mother   . Emphysema Mother   . Diverticulosis Mother   . Thyroid disease Mother   . Asthma Brother   . Bone cancer Brother   . Diabetes Paternal Grandmother   . Sarcoidosis Son   . Thyroid disease Daughter   . Anesthesia problems Neg Hx   . Hypotension Neg Hx   . Malignant hyperthermia Neg Hx   .  Pseudochol deficiency Neg Hx   . Colon cancer Neg Hx     Social History:  reports that he quit smoking about 52 years ago. He has a 18.00 pack-year smoking history. He has never used smokeless tobacco. He reports current alcohol use of about 4.0 standard drinks of alcohol per week. He reports that he does not use drugs.  Allergies:  Allergies  Allergen Reactions  . Simvastatin Other (See Comments)    MYALGIAS  . Oxycodone Other (See Comments)    Severe constipation    Allergies as of 01/23/2019      Reactions   Simvastatin Other (See Comments)   MYALGIAS   Oxycodone Other (See Comments)   Severe constipation      Medication List       Accurate as of January 23, 2019 10:48 AM. If you have any questions, ask your nurse or doctor.        acetaminophen 500 MG tablet Commonly known as: TYLENOL Take 1,000 mg by mouth every 6 (six) hours as needed for mild pain.   ARTIFICIAL TEARS OP Place 1 drop into both eyes 2 (two) times daily.   aspirin 81 MG tablet Take 81 mg by mouth every evening.   CALCIUM 600 + D PO Take 1 tablet by mouth daily.   Fish Oil 1000 MG Caps Take 1,000 mg by mouth every evening.   Flomax 0.4 MG Caps capsule Generic drug: tamsulosin Take 0.4 mg by mouth 2 (two) times daily.   glucose blood test strip Commonly known as: PRECISION XTRA TEST STRIPS USE TO CHECK BLOOD SUGAR ONCE DAILY DX E11.9   Lancets Ultra Thin 30G Misc Patient is to use to check blood glucose in the AM fasting and 2 hours after largest meal.   losartan 50 MG tablet Commonly known as: COZAAR TAKE 1 TABLET DAILY   multivitamin with minerals Tabs  tablet Take 1 tablet by mouth daily.   NONFORMULARY OR COMPOUNDED ITEM Glucometer (Precision XTRA- Model #XCC [id number])   Restora Caps One tab qd What changed:   how much to take  how to take this  when to take this  additional instructions   rosuvastatin 5 MG tablet Commonly known as: Crestor Take 1 tablet (5 mg total) by mouth at bedtime.   Synthroid 150 MCG tablet Generic drug: levothyroxine TAKE 1 TABLET DAILY BEFORE BREAKFAST   vitamin B-12 1000 MCG tablet Commonly known as: CYANOCOBALAMIN Take 1,000 mcg by mouth daily.        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 Any transcriptional errors that result from this process are unintentional.

## 2019-01-29 ENCOUNTER — Telehealth: Payer: Self-pay | Admitting: Family Medicine

## 2019-01-29 NOTE — Telephone Encounter (Signed)
Can you please call Kyle Simon and have him make a Doxy follow-up visit with me in the near future at his convenience?     Let Kyle Simon know that his endocrinologist reached out to me and I would like to discuss with the patient further work-up/  Recommendations for his low sodium.

## 2019-02-02 DIAGNOSIS — I739 Peripheral vascular disease, unspecified: Secondary | ICD-10-CM | POA: Diagnosis not present

## 2019-02-02 DIAGNOSIS — E1151 Type 2 diabetes mellitus with diabetic peripheral angiopathy without gangrene: Secondary | ICD-10-CM | POA: Diagnosis not present

## 2019-02-02 DIAGNOSIS — L84 Corns and callosities: Secondary | ICD-10-CM | POA: Diagnosis not present

## 2019-02-02 DIAGNOSIS — L603 Nail dystrophy: Secondary | ICD-10-CM | POA: Diagnosis not present

## 2019-02-09 ENCOUNTER — Other Ambulatory Visit: Payer: Medicare Other

## 2019-02-09 ENCOUNTER — Other Ambulatory Visit: Payer: Self-pay

## 2019-02-09 DIAGNOSIS — E119 Type 2 diabetes mellitus without complications: Secondary | ICD-10-CM | POA: Diagnosis not present

## 2019-02-09 DIAGNOSIS — E1159 Type 2 diabetes mellitus with other circulatory complications: Secondary | ICD-10-CM

## 2019-02-09 DIAGNOSIS — I152 Hypertension secondary to endocrine disorders: Secondary | ICD-10-CM

## 2019-02-09 DIAGNOSIS — E1169 Type 2 diabetes mellitus with other specified complication: Secondary | ICD-10-CM

## 2019-02-09 DIAGNOSIS — I10 Essential (primary) hypertension: Secondary | ICD-10-CM | POA: Diagnosis not present

## 2019-02-09 DIAGNOSIS — E039 Hypothyroidism, unspecified: Secondary | ICD-10-CM | POA: Diagnosis not present

## 2019-02-09 DIAGNOSIS — E785 Hyperlipidemia, unspecified: Secondary | ICD-10-CM | POA: Diagnosis not present

## 2019-02-09 NOTE — Addendum Note (Signed)
Addended by: Mickel Crow on: 02/09/2019 08:25 AM   Modules accepted: Orders

## 2019-02-10 LAB — ALT: ALT: 15 IU/L (ref 0–44)

## 2019-02-10 LAB — LIPID PANEL
Chol/HDL Ratio: 2.5 ratio (ref 0.0–5.0)
Cholesterol, Total: 111 mg/dL (ref 100–199)
HDL: 44 mg/dL (ref >39)
LDL Chol Calc (NIH): 51 mg/dL (ref 0–99)
Triglycerides: 79 mg/dL (ref 0–149)
VLDL Cholesterol Cal: 16 mg/dL (ref 5–40)

## 2019-02-12 ENCOUNTER — Other Ambulatory Visit: Payer: Self-pay | Admitting: Family Medicine

## 2019-02-12 DIAGNOSIS — E1159 Type 2 diabetes mellitus with other circulatory complications: Secondary | ICD-10-CM

## 2019-02-12 DIAGNOSIS — I152 Hypertension secondary to endocrine disorders: Secondary | ICD-10-CM

## 2019-02-13 ENCOUNTER — Ambulatory Visit (INDEPENDENT_AMBULATORY_CARE_PROVIDER_SITE_OTHER): Payer: Medicare Other | Admitting: Family Medicine

## 2019-02-13 ENCOUNTER — Encounter: Payer: Self-pay | Admitting: Family Medicine

## 2019-02-13 ENCOUNTER — Telehealth: Payer: Self-pay

## 2019-02-13 ENCOUNTER — Other Ambulatory Visit: Payer: Self-pay

## 2019-02-13 VITALS — BP 129/72 | HR 49 | Temp 97.6°F | Wt 188.0 lb

## 2019-02-13 DIAGNOSIS — E785 Hyperlipidemia, unspecified: Secondary | ICD-10-CM

## 2019-02-13 DIAGNOSIS — E1159 Type 2 diabetes mellitus with other circulatory complications: Secondary | ICD-10-CM

## 2019-02-13 DIAGNOSIS — Z79899 Other long term (current) drug therapy: Secondary | ICD-10-CM

## 2019-02-13 DIAGNOSIS — E871 Hypo-osmolality and hyponatremia: Secondary | ICD-10-CM | POA: Diagnosis not present

## 2019-02-13 DIAGNOSIS — E1169 Type 2 diabetes mellitus with other specified complication: Secondary | ICD-10-CM

## 2019-02-13 DIAGNOSIS — I10 Essential (primary) hypertension: Secondary | ICD-10-CM

## 2019-02-13 DIAGNOSIS — E119 Type 2 diabetes mellitus without complications: Secondary | ICD-10-CM | POA: Diagnosis not present

## 2019-02-13 DIAGNOSIS — E222 Syndrome of inappropriate secretion of antidiuretic hormone: Secondary | ICD-10-CM

## 2019-02-13 DIAGNOSIS — E039 Hypothyroidism, unspecified: Secondary | ICD-10-CM

## 2019-02-13 DIAGNOSIS — I152 Hypertension secondary to endocrine disorders: Secondary | ICD-10-CM

## 2019-02-13 DIAGNOSIS — Z5320 Procedure and treatment not carried out because of patient's decision for unspecified reasons: Secondary | ICD-10-CM

## 2019-02-13 DIAGNOSIS — R931 Abnormal findings on diagnostic imaging of heart and coronary circulation: Secondary | ICD-10-CM

## 2019-02-13 DIAGNOSIS — R197 Diarrhea, unspecified: Secondary | ICD-10-CM

## 2019-02-13 DIAGNOSIS — N183 Chronic kidney disease, stage 3 unspecified: Secondary | ICD-10-CM

## 2019-02-13 MED ORDER — ROSUVASTATIN CALCIUM 5 MG PO TABS: 5.0000 mg | ORAL_TABLET | Freq: Every day | ORAL | 1 refills | Status: DC

## 2019-02-13 NOTE — Telephone Encounter (Signed)
CMA from Dr. Hershal Coria office called and stated that she attempted to order the low dose CT of lungs that Dr. Dwyane Dee had requested to rule out mass due to new onset hyponatremia. She stated that the diagnosis code does not work that she was given, and Dr. Raliegh Scarlet provided a new one of SIADH as the reason for ordering and this did not work either. Can you please provide a Dx code for this CT and clarify whether you prefer Dr. Hershal Coria office to order this or if you will order yourself for the pt?

## 2019-02-13 NOTE — Progress Notes (Signed)
Virtual / live video office visit note for [employer], D.O- Primary Care Physician at [facility]   I connected with current patient today and beyond visually recognizing the correct individual, I verified that I am speaking with the correct person using two identifiers.  . Location of the patient: Home . Location of the provider: Office Only the patient (+/- their family members at pt's discretion) and myself were participating in the encounter    - This visit type was conducted due to national recommendations for restrictions regarding the COVID-19 Pandemic (e.g. social distancing) in an effort to limit this patient's exposure and mitigate transmission in our community.  This format is felt to be most appropriate for this patient at this time.   - The patient did have access to video technology today  - No physical exam could be performed with this format, beyond that communicated to [place] by the patient/ family members as noted.   - Additionally my office staff/ schedulers discussed with the patient that there may be a monetary charge related to this service, depending on patient's medical insurance.   The patient expressed understanding, and agreed to proceed.      History of Present Illness: I, [name], am serving as scribe for Dr. Mellody Dance.  Patient is asymptomatic, with no impairment to mentation or gait.  - Hyponatremia Patient continues taking salt tablets per Dr. Dwyane Dee.  Notes he is aware he needs to continue to have his sodium monitored.  Notes these tablets sometimes catch in his throat and leave a bad taste, and he does not enjoy taking them.  Notes he hasn't been engaging particularly in fluid restriction.  Says he "hasn't cut back much" on his hydration due to his ongoing difficulty with bowel movements, stating he knows he needs to stay hydrated for his bowels.  He hasn't cut back on his salt.  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 Dwyane Dee.  - Bowel Concerns Says his bowel movements have been an issue for nearly a year, and wonders if this is connected to his low sodium levels.  Notes "it's continuous" and he often worries about his symptoms.  "If I sit on the pot, fluid comes out; clear sometimes, sometimes contaminated."  States then he has gas, and then has loose bowels.  - History of Smoking Quit smoking in 1968, and believes he smoked for about 21-22 years.  Does not think he smoked over a pack of cigarettes per day.   Depression screen [facility] 2/9 02/13/2019 11/15/2018 04/12/2018 08/22/2017 05/18/2017  Decreased Interest 0 0 0 0 0  Down, Depressed, Hopeless 0 0 0 0 0  PHQ - 2 Score 0 0 0 0 0  Altered sleeping 0 0 0 0 0  Tired, decreased energy 0 0 0 2 0  Change in appetite 0 0 0 0 0  Feeling bad or failure about yourself  0 0 0 0 0  Trouble concentrating 0 0 0 0 0  Moving slowly or fidgety/restless 0 0 0 0 0  Suicidal thoughts 0 0 0 0 0  PHQ-9 Score 0 0 0 2 0  Difficult doing work/chores - Not difficult at all Not difficult at all - Not difficult at all  Some recent data might be hidden    GAD 7 :  Generalized Anxiety Score 07/04/2018  Nervous, Anxious, on Edge 0  Control/stop worrying 0  Worry too much - different things 0  Trouble relaxing 0  Restless 0  Easily annoyed or irritable 0  Afraid - awful might happen 0  Total GAD 7 Score 0  Anxiety Difficulty Not difficult at all     Impression and Recommendations:    1. SIADH (syndrome of inappropriate ADH production) ([place])   2. Hyponatremia   3. Diabetes mellitus without complication ([place])   4. Hypertension associated with diabetes ([place])   5. Hyperlipidemia associated with type 2 diabetes mellitus (H[facility])   6. Stage 3 chronic kidney disease, unspecified whether stage 3a or 3b CKD   7. Diarrhea, unspecified type   8.  Hypothyroidism, unspecified type   9. Statin declined-    10. Hypomagnesemia   11. High risk medications (not anticoagulants) long-term use     Hyponatremia & SIADH - Last sodium level obtained 01/23/2019, at that time = 126.  Discussed that these sodium levels have been obtained every four weeks.  Will continue to monitor.  - Reviewed the nature of sodium balance in the body, and need to assess why patient's sodium is low. - When sodium is low, explained critical importance of engaging in both fluid restriction and taking sodium as prescribed by ENDO.  Patient expresses understanding of treatment plan and agrees to cut back on fluid intake- which he had not been doing despite endo notes stating counseling given regarding this.  Extensive education provided and all questions answered.  - Recently spoke with Dr. Dwyane Dee, patient's endocrinologist, regarding low sodium levels.  Discussed that with no known cause of patient's hyponatremia, he stated additional screening through hemoccult cards and low-dose CT may be indicated.  - Explained need to rule out presence of pulmonary tumor/growth. - Need for low-dose CT of chest per ENDO recs.  However, dx of SIADH is not adequate to cover for the test and hence, I am unable to obtain dx in order to get insurance to cover it.  Will await their input/ further direction. - Perhaps being specialists, they can get this test covered. Will reach out to them again.  - Explained need for hemoccult screening.  - Additional labs for SIADH will be left to endocrinology. - encouraged pt to see his GI doc re: ongoing GI SX that are poorly ocntrolled  - Patient is being worked up by Dr. Dwyane Dee for his SIADH. - Salt tablets were initiated and water restriction was encouraged by Endocrinology.  - Patient does not appear to be restricting water intake yet, but encouraged again to do so today.  - Cortisol levels were normal and TSH normal as well.  - Will add on  urine sodium today, CMP, re-check urine osmolality.  -  Patient's urine osmolality was normal when last checked, however after initiation of therapy, will likely be re-checked by Endocrinology in near future.  Further evaluation per endocrinology after initiation of salt tablets by endocrinology as well as water restriction.  GI Concerns - Diarrhea - Reviewed patient's bowel concerns during appointment today. - Advised patient to call GI and ask for appointment to be seen and discuss symptoms. - Encouraged patient to inform GI regarding his recently decreasing sodium levels. - Will continue to monitor.  Recommendations - Return near future for lab work as advised.   - As part of my medical decision making, I reviewed the following data within the [place] History obtained from pt /  family, CMA notes reviewed and incorporated if applicable, Labs reviewed, Radiograph/ tests reviewed if applicable and OV notes from prior OV's with me, as well as other specialists she/he has seen since seeing me last, were all reviewed and used in my medical decision making process today.   - Additionally, discussion had with patient regarding txmnt plan, their biases about that plan etc were used in my medical decision making today.   - The patient agreed with the plan and demonstrated an understanding of the instructions.   No barriers to understanding were identified.    - Red flag symptoms and signs discussed in detail.  Patient expressed understanding regarding what to do in case of emergency\ urgent symptoms.  The patient was advised to call back or seek an in-person evaluation if the symptoms worsen or if the condition fails to improve as anticipated.   Return for lab work in 2-3 days as directed, including CMP, hemoccult cards.    Orders Placed This Encounter  Procedures  . CMP w GFR  . Hemoglobin A1c  . Urine Microalbumin / creatinine ratio  . Lipid panel  . Vit B12  . TSH  .  T4, free- Future  . Magnesium    Meds ordered this encounter  Medications  . rosuvastatin (CRESTOR) 5 MG tablet    Sig: Take 1 tablet (5 mg total) by mouth at bedtime.    Dispense:  90 tablet    Refill:  1    Medications Discontinued During This Encounter  Medication Reason  . rosuvastatin (CRESTOR) 5 MG tablet Reorder      Note:  This note was prepared with assistance of Dragon voice recognition software. Occasional wrong-word or sound-a-like substitutions may have occurred due to the inherent limitations of voice recognition software.   This document serves as a record of services personally performed by Mellody Dance, DO. It was created on her behalf by [name], a trained medical scribe. The creation of this record is based on the scribe's personal observations and the provider's statements to them.   This case required medical decision making of at least moderate complexity. The above documentation has been reviewed to be accurate and was completed by [name], D.O.      Patient Care Team    Relationship Specialty Notifications Start End  Mellody Dance, DO PCP - General Family Medicine  08/05/15   Rana Snare, MD Consulting Physician Urology  11/10/15   Lavonna Monarch, MD Consulting Physician Dermatology  [id number]   Jodi Marble, MD Consulting Physician Otolaryngology  11/10/15   Calton Dach, MD Referring Physician Optometry  11/10/15   Inocencio Homes, [place] Consulting Physician Podiatry  11/10/15   Bo Merino, MD Consulting Physician Rheumatology  11/10/15   Vickey Huger, MD Consulting Physician Orthopedic Surgery  11/10/15   Erline Levine, MD Consulting Physician Neurosurgery  11/10/15   Deboraha Sprang, MD Consulting Physician Cardiology  01/04/16    Comment: seen '12 last  Elayne Snare, MD Consulting Physician Endocrinology  04/14/17   Kathrynn Ducking, MD Consulting Physician Neurology  [id number]   Jodi Marble, MD Consulting  Physician Otolaryngology  04/22/17   Juanita Craver, MD Consulting Physician Gastroenterology  11/19/18    Comment: whom he has seen sev times in past for his chronic GI sx  Milus Banister, MD Attending Physician Gastroenterology  02/13/19     -Vitals obtained; medications/ allergies reconciled;  personal medical, social, Sx etc.histories were updated by CMA, reviewed by me and  are reflected in chart  Patient Active Problem List   Diagnosis Date Noted  . Statin declined-  11/20/2018  . History of noncompliance with medical treatment, presenting hazards to health 11/20/2018  . Stressful life event affecting family-loss on May 31st 08/22/2017  . DM assoc with CKD (chronic kidney disease), stage III ([place]) 04/14/2017  . Diabetes mellitus without complication ([place]) 123456  . Hypertension associated with diabetes ([place]) 03/20/2014  . Hyperlipidemia associated with type 2 diabetes mellitus ([place]) 04/19/2007  . H/O total knee replacement, bilateral 11/22/2016  . High risk medications (not anticoagulants) long-term use 01/04/2016  . Hypomagnesemia 12/29/2015  . h/o Hyponatremia 12/29/2015  . BPH (benign prostatic hyperplasia) 04/18/2007  . Hypothyroidism 04/20/2006  . Age-related osteoporosis without current pathological fracture 07/16/2016  . Flatulence symptom 07/25/2015  . Diarrhea 06/21/2015  . B12 deficiency anemia 01/22/2013  . Anemia, unspecified 08/13/2012  . GERD (gastroesophageal reflux disease) 03/26/2011  . Flatulence 11/29/2018  . Elevated coronary artery calcium score 11/25/2018  . SOB (shortness of breath) 10/05/2017  . Aortic atherosclerosis ([place]) 10/05/2017  . Fatigue 08/22/2017  . Chronic pain of right knee 08/22/2017  . Inactivity-recently due to knee pain 08/22/2017  . Lumbar radiculopathy 06/16/2017  . Total knee replacement status, right 04/14/2017  . Arthralgia of right temporomandibular joint 03/02/2017  . Chronic eczematous otitis externa of both ears 03/02/2017   . Presbycusis of both ears 03/02/2017  . ETD (Eustachian tube dysfunction), right 09/15/2016  . Strain of masseter muscle 09/15/2016  . Trochanteric bursitis of right hip 09/07/2016  . History of lumbar fusion 07/16/2016  . History of total knee arthroplasty, left 07/16/2016  . Cough 01/04/2016  . Cerumen impaction 08/25/2015  . Constipation 08/06/2015  . Chronic rhinitis 11/01/2014  . Spondylolisthesis of lumbar region 10/23/2013  . Diverticulosis of colon without hemorrhage 01/22/2013  . Synovial cyst of lumbar spine 07/26/2011  . Spasms of the hands or feet 07/26/2011  . SENILE KERATOSIS 06/12/2008  . SNORING, HX OF 04/19/2007     Current Meds  Medication Sig  . acetaminophen (TYLENOL) 500 MG tablet Take 1,000 mg by mouth every 6 (six) hours as needed for mild pain.   Marland Kitchen aspirin 81 MG tablet Take 81 mg by mouth every evening.   . Calcium Carb-Cholecalciferol (CALCIUM 600 + D PO) Take 1 tablet by mouth daily.   Marland Kitchen glucose blood (PRECISION XTRA TEST STRIPS) test strip USE TO CHECK BLOOD SUGAR ONCE DAILY DX E11.9  . Hypromellose (ARTIFICIAL TEARS OP) Place 1 drop into both eyes 2 (two) times daily.  Marland Kitchen LANCETS ULTRA THIN 30G MISC Patient is to use to check blood glucose in the AM fasting and 2 hours after largest meal.  . losartan (COZAAR) 50 MG tablet Take 1 tablet (50 mg total) by mouth daily. **PATIENT NEEDS APT FOR FURTHER REFILLS**  . Multiple Vitamin (MULTIVITAMIN WITH MINERALS) TABS Take 1 tablet by mouth daily.  . NONFORMULARY OR COMPOUNDED ITEM Glucometer Museum/gallery curator XTRA- Model #[id number])  . Omega-3 Fatty Acids (FISH OIL) 1000 MG CAPS Take 1,000 mg by mouth every evening.  . Probiotic Product (RESTORA) CAPS One tab qd (Patient taking differently: Take 1 capsule by mouth every evening. )  . rosuvastatin (CRESTOR) 5 MG tablet Take 1 tablet (5 mg total) by mouth at bedtime.  Marland Kitchen SYNTHROID 150 MCG tablet TAKE 1 TABLET DAILY BEFORE BREAKFAST  . Tamsulosin HCl (FLOMAX) 0.4 MG  CAPS Take 0.4 mg by mouth 2 (two) times daily.   . vitamin B-12 (  CYANOCOBALAMIN) 1000 MCG tablet Take 1,000 mcg by mouth daily.  . [DISCONTINUED] rosuvastatin (CRESTOR) 5 MG tablet Take 1 tablet (5 mg total) by mouth at bedtime.     Allergies  Allergen Reactions  . Simvastatin Other (See Comments)    MYALGIAS  . Oxycodone Other (See Comments)    Severe constipation     ROS:  See above HPI for pertinent positives and negatives   Objective:   Blood pressure 129/72, pulse (!) 49, temperature 97.6 F (36.4 C), weight 188 lb (85.3 kg), SpO2 94 %.  (if some vitals are omitted, this means that patient was UNABLE to obtain them even though they were asked to get them prior to OV today.  They were asked to call us at their earliest convenience with these once obtained.)  General: A & O * 3; visually in no acute distress; in usual state of health.  Skin: Visible skin appears normal and pt's usual skin color HEENT:  EOMI, head is normocephalic and atraumatic.  Sclera are anicteric. Neck has a good range of motion.  Lips are noncyanotic Chest: normal chest excursion and movement Respiratory: speaking in full sentences, no conversational dyspnea; no use of accessory muscles Psych: insight good, mood- appears full

## 2019-02-14 NOTE — Telephone Encounter (Signed)
Dawn: Could you please check to see what diagnosis will work for CT scan of the chest for hyponatremia, SIADH

## 2019-02-14 NOTE — Telephone Encounter (Signed)
Hello,   If it is the low dose lung CT scan for lung screen that you are referring to , he is past the eligible screening age for Medicare.   If you are trying to do a diagnostic CT of chest you could order as on of these below and see if the Hyponatremia will go thru with it.    CHEST(thorax) CT [address]- W/O CONTRAST 71260- W/ CONTRAST 71270- W/O & W/ CONTRAST  Let me know if this works.    Thanks , [employer]

## 2019-02-15 ENCOUNTER — Encounter (INDEPENDENT_AMBULATORY_CARE_PROVIDER_SITE_OTHER): Payer: Medicare Other | Admitting: Ophthalmology

## 2019-02-15 ENCOUNTER — Other Ambulatory Visit: Payer: Self-pay

## 2019-02-15 DIAGNOSIS — H35033 Hypertensive retinopathy, bilateral: Secondary | ICD-10-CM | POA: Diagnosis not present

## 2019-02-15 DIAGNOSIS — E113393 Type 2 diabetes mellitus with moderate nonproliferative diabetic retinopathy without macular edema, bilateral: Secondary | ICD-10-CM | POA: Diagnosis not present

## 2019-02-15 DIAGNOSIS — E11319 Type 2 diabetes mellitus with unspecified diabetic retinopathy without macular edema: Secondary | ICD-10-CM | POA: Diagnosis not present

## 2019-02-15 DIAGNOSIS — H43813 Vitreous degeneration, bilateral: Secondary | ICD-10-CM

## 2019-02-15 DIAGNOSIS — I10 Essential (primary) hypertension: Secondary | ICD-10-CM

## 2019-02-15 DIAGNOSIS — H353111 Nonexudative age-related macular degeneration, right eye, early dry stage: Secondary | ICD-10-CM

## 2019-02-15 LAB — HM DIABETES EYE EXAM

## 2019-02-16 ENCOUNTER — Other Ambulatory Visit: Payer: Medicare Other

## 2019-02-16 DIAGNOSIS — Z79899 Other long term (current) drug therapy: Secondary | ICD-10-CM

## 2019-02-16 DIAGNOSIS — N183 Chronic kidney disease, stage 3 unspecified: Secondary | ICD-10-CM

## 2019-02-16 DIAGNOSIS — E1159 Type 2 diabetes mellitus with other circulatory complications: Secondary | ICD-10-CM

## 2019-02-16 DIAGNOSIS — E039 Hypothyroidism, unspecified: Secondary | ICD-10-CM

## 2019-02-16 DIAGNOSIS — E119 Type 2 diabetes mellitus without complications: Secondary | ICD-10-CM

## 2019-02-16 DIAGNOSIS — I152 Hypertension secondary to endocrine disorders: Secondary | ICD-10-CM

## 2019-02-16 DIAGNOSIS — Z5320 Procedure and treatment not carried out because of patient's decision for unspecified reasons: Secondary | ICD-10-CM

## 2019-02-16 DIAGNOSIS — E1169 Type 2 diabetes mellitus with other specified complication: Secondary | ICD-10-CM

## 2019-02-16 DIAGNOSIS — E871 Hypo-osmolality and hyponatremia: Secondary | ICD-10-CM

## 2019-02-16 DIAGNOSIS — R197 Diarrhea, unspecified: Secondary | ICD-10-CM

## 2019-02-16 DIAGNOSIS — E222 Syndrome of inappropriate secretion of antidiuretic hormone: Secondary | ICD-10-CM

## 2019-02-17 LAB — MAGNESIUM: Magnesium: 2.1 mg/dL (ref 1.6–2.3)

## 2019-02-17 LAB — COMPREHENSIVE METABOLIC PANEL
ALT: 14 IU/L (ref 0–44)
AST: 20 IU/L (ref 0–40)
Albumin/Globulin Ratio: 1.5 (ref 1.2–2.2)
Albumin: 4.4 g/dL (ref 3.6–4.6)
Alkaline Phosphatase: 79 IU/L (ref 39–117)
BUN/Creatinine Ratio: 13 (ref 10–24)
BUN: 13 mg/dL (ref 8–27)
Bilirubin Total: 0.5 mg/dL (ref 0.0–1.2)
CO2: 24 mmol/L (ref 20–29)
Calcium: 9.6 mg/dL (ref 8.6–10.2)
Chloride: 93 mmol/L — ABNORMAL LOW (ref 96–106)
Creatinine, Ser: 0.99 mg/dL (ref 0.76–1.27)
GFR calc Af Amer: 81 mL/min/{1.73_m2} (ref >59)
GFR calc non Af Amer: 70 mL/min/{1.73_m2} (ref >59)
Globulin, Total: 3 g/dL (ref 1.5–4.5)
Glucose: 133 mg/dL — ABNORMAL HIGH (ref 65–99)
Potassium: 5 mmol/L (ref 3.5–5.2)
Sodium: 133 mmol/L — ABNORMAL LOW (ref 134–144)
Total Protein: 7.4 g/dL (ref 6.0–8.5)

## 2019-02-17 LAB — LIPID PANEL
Chol/HDL Ratio: 2.4 ratio (ref 0.0–5.0)
Cholesterol, Total: 119 mg/dL (ref 100–199)
HDL: 49 mg/dL (ref >39)
LDL Chol Calc (NIH): 49 mg/dL (ref 0–99)
Triglycerides: 117 mg/dL (ref 0–149)
VLDL Cholesterol Cal: 21 mg/dL (ref 5–40)

## 2019-02-17 LAB — TSH: TSH: 3.35 u[IU]/mL (ref 0.450–4.500)

## 2019-02-17 LAB — T4, FREE: Free T4: 1.66 ng/dL (ref 0.82–1.77)

## 2019-02-17 LAB — VITAMIN B12: Vitamin B-12: 1558 pg/mL — ABNORMAL HIGH (ref 232–1245)

## 2019-02-17 LAB — MICROALBUMIN / CREATININE URINE RATIO
Creatinine, Urine: 51.1 mg/dL
Microalb/Creat Ratio: <6 mg/g creat (ref 0–29)
Microalbumin, Urine: <3 ug/mL

## 2019-02-17 LAB — HEMOGLOBIN A1C
Est. average glucose Bld gHb Est-mCnc: 134 mg/dL
Hgb A1c MFr Bld: 6.3 % — ABNORMAL HIGH (ref 4.8–5.6)

## 2019-02-19 ENCOUNTER — Telehealth: Payer: Self-pay | Admitting: Family Medicine

## 2019-02-19 ENCOUNTER — Other Ambulatory Visit: Payer: Self-pay | Admitting: Endocrinology

## 2019-02-19 DIAGNOSIS — E1169 Type 2 diabetes mellitus with other specified complication: Secondary | ICD-10-CM

## 2019-02-19 DIAGNOSIS — E785 Hyperlipidemia, unspecified: Secondary | ICD-10-CM

## 2019-02-19 DIAGNOSIS — J9811 Atelectasis: Secondary | ICD-10-CM

## 2019-02-19 NOTE — Telephone Encounter (Signed)
[facility] is need order clarification on a medication for this patient. They can be reached at [phone].

## 2019-02-19 NOTE — Telephone Encounter (Signed)
CT scan ordered again with free text explanation

## 2019-02-20 MED ORDER — ROSUVASTATIN CALCIUM 5 MG PO TABS: 5.0000 mg | ORAL_TABLET | Freq: Every day | ORAL | 0 refills | Status: DC

## 2019-02-20 NOTE — Telephone Encounter (Signed)
Called and spoke Cricket with [facility].  Script will be filled today and sent out.

## 2019-02-20 NOTE — Telephone Encounter (Signed)
  Pt is suppose to be on this medicine- crestor that was sent into pharmacy.  I will resend script and please call [facility] to confirm pt should be on the medicine.   Me to Kyle Simon, Kyle Simon"      11/20/18 6:52 PM Kyle Simon.   I just wanted to let you know that I think it is a bad idea that you do not restart Crestor.    You have been on this in the past and tolerated it well without any difficulties.     Several of your cardiology providers including the one you saw back in 8 of 2019, Dr. Percival Spanish, felt you should be on the Crestor as well.  They have all put it in their notes stating you should be on a statin and, you are due to follow-up with them as well.  Thus, please call Dr. Rosezella [place] office for a follow-up visit as you are overdue.   Just let me know if you are willing to start it or not.  We can just put in the chart that patient "declines statin medication" so that your insurance is aware and does not keep sending me letters that you should be on it etc.     My best,   Dr. Raliegh Scarlet

## 2019-02-20 NOTE — Telephone Encounter (Signed)
Good morning Kyle Simon.  Yes I tried the diagnosis of hyponatremia as well as SIADH, neither of which will validate why a CT chest needs to be obtained.   That is why reached out to you/ Dr. Dwyane Dee and his team.   Often times as specialists, you folks are able to get things push through and done easier than primary care offices.     Thanks.

## 2019-02-20 NOTE — Addendum Note (Signed)
Addended by: [name] on: 02/20/2019 03:15 PM   Modules accepted: Orders

## 2019-02-20 NOTE — Telephone Encounter (Signed)
Spoke with Mammie Russian, the pharmacist at [facility].  Rosuvastatin order was cancelled due to allergy on file for patient.  It states patient has allergy to Simvastatin, since Rosuvastatin is the same drug class the order was cancelled.  They tried calling the patient to verify this allergy but got no response from patient.  Please verify this prescription is accurate.  If so, you can call back to [facility] to restart the prescription from 02/13/19.

## 2019-03-05 ENCOUNTER — Ambulatory Visit: Payer: Medicare Other | Attending: Internal Medicine

## 2019-03-05 DIAGNOSIS — Z20822 Contact with and (suspected) exposure to covid-19: Secondary | ICD-10-CM

## 2019-03-06 LAB — NOVEL CORONAVIRUS, NAA: SARS-CoV-2, NAA: NOT DETECTED

## 2019-03-11 ENCOUNTER — Ambulatory Visit: Payer: Medicare Other | Attending: Internal Medicine

## 2019-03-11 DIAGNOSIS — Z23 Encounter for immunization: Secondary | ICD-10-CM | POA: Diagnosis not present

## 2019-03-11 NOTE — Progress Notes (Signed)
   Covid-19 Vaccination Clinic  Name:  Kyle Simon    MRN: [id number] DOB: 11/30/1932  03/11/2019  [name] was observed post Covid-19 immunization for 15 minutes without incidence. He was provided with Vaccine Information Sheet and instruction to access the V-Safe system.   [name] was instructed to call 911 with any severe reactions post vaccine: Marland Kitchen Difficulty breathing  . Swelling of your face and throat  . A fast heartbeat  . A bad rash all over your body  . Dizziness and weakness    Immunizations Administered    Name Date Dose VIS Date Route   Pfizer COVID-19 Vaccine 03/11/2019 12:34 PM 0.3 mL 02/09/2019 Intramuscular   Manufacturer: [place]   Lot: [id number]   NDC: [id number]

## 2019-03-21 ENCOUNTER — Encounter: Payer: Self-pay | Admitting: Family Medicine

## 2019-03-21 ENCOUNTER — Other Ambulatory Visit: Payer: Self-pay

## 2019-03-21 ENCOUNTER — Ambulatory Visit (INDEPENDENT_AMBULATORY_CARE_PROVIDER_SITE_OTHER): Payer: Medicare Other | Admitting: Family Medicine

## 2019-03-21 VITALS — BP 144/73 | HR 99 | Temp 97.7°F | Resp 12 | Ht 70.0 in | Wt 198.1 lb

## 2019-03-21 DIAGNOSIS — I10 Essential (primary) hypertension: Secondary | ICD-10-CM | POA: Diagnosis not present

## 2019-03-21 DIAGNOSIS — E222 Syndrome of inappropriate secretion of antidiuretic hormone: Secondary | ICD-10-CM | POA: Insufficient documentation

## 2019-03-21 DIAGNOSIS — L723 Sebaceous cyst: Secondary | ICD-10-CM | POA: Diagnosis not present

## 2019-03-21 DIAGNOSIS — E119 Type 2 diabetes mellitus without complications: Secondary | ICD-10-CM | POA: Diagnosis not present

## 2019-03-21 DIAGNOSIS — K582 Mixed irritable bowel syndrome: Secondary | ICD-10-CM

## 2019-03-21 DIAGNOSIS — R197 Diarrhea, unspecified: Secondary | ICD-10-CM | POA: Diagnosis not present

## 2019-03-21 DIAGNOSIS — E1159 Type 2 diabetes mellitus with other circulatory complications: Secondary | ICD-10-CM | POA: Diagnosis not present

## 2019-03-21 DIAGNOSIS — E871 Hypo-osmolality and hyponatremia: Secondary | ICD-10-CM | POA: Diagnosis not present

## 2019-03-21 DIAGNOSIS — I152 Hypertension secondary to endocrine disorders: Secondary | ICD-10-CM

## 2019-03-21 NOTE — Progress Notes (Signed)
Pt here for an acute care OV today  Impression and Recommendations:    1. Sebaceous cyst   2. Diabetes mellitus without complication ([place])   3. Hypertension associated with diabetes ([place])   4. SIADH (syndrome of inappropriate ADH production) ([place])   5. Hyponatremia   6. Diarrhea, unspecified type   7. Irritable bowel syndrome with both constipation and diarrhea      Sebaceous Cyst - Reviewed patient's symptoms extensively during appointment.  - Educated patient today regarding onset and treatment of sebaceous cyst.  Lengthy conversation held and counseling provided.  All questions answered.  - Discussed need for referral to dermatology for appropriate removal and assessment.  - Encouraged patient to call dermatology and schedule an appointment for further consultation.  - Told patient to avoid irritation to the area. - Do NOT try to pop the area.  - Will continue to monitor alongside specialist.  Diabetes mellitus - Stable at this time. - Continue management as established. - Continue to monitor fasting blood sugar at home. - Will continue to monitor and re-check as discussed.  Hypertension associated with DM - Stable at this time. - Continue management as established. - Continue to monitor BP at home. - Will continue to monitor.  Flatulence, IBS w/ Constipation and Diarrhea - Told patient to inform gastroenterology that his symptoms are affecting his quality of life and that he desires consultation ASAP.  - Will continue to monitor alongside specialist.   Hyponatremia, SIADH - Patient's labs were last checked 02/17/2019. - BMP ordered today. - Lab results will be sent to Dr. Dwyane Dee as well.  - Reviewed that restricting fluids is just as important as taking salt tablet as prescribed. - Patient will continue to follow all directions of Dr. Dwyane Dee.  - Will continue to monitor alongside specialist.  Recommendations - Discussed importance of following up for  chronic office visits as well as acute concerns. - Patient will return in 3 months for DM, HTN, HLD. - Otherwise, consult with specialists as established.   Orders Placed This Encounter  Procedures  . Basic metabolic panel     Education and routine counseling performed. Handouts provided  Gross side effects, risk and benefits, and alternatives of medications and treatment plan in general discussed with patient.  Patient is aware that all medications have potential side effects and we are unable to predict every side effect or drug-drug interaction that may occur.   Patient will call with any questions prior to using medication if they have concerns.    Expresses verbal understanding and consents to current therapy and treatment regimen.  No barriers to understanding were identified.  Red flag symptoms and signs discussed in detail.  Patient expressed understanding regarding what to do in case of emergency\urgent symptoms   Please see AVS handed out to patient at the end of our visit for further patient instructions/ counseling done pertaining to today's office visit.   Return for f/up 3 months for chronic care, DM, HTN, HLD.     Note:  This document was prepared occasionally using Dragon voice recognition software and may include unintentional dictation errors in addition to a scribe.  This document serves as a record of services personally performed by Mellody Dance, DO. It was created on her behalf by [name], a trained medical scribe. The creation of this record is based on the scribe's personal observations and the provider's statements to them.   This case required medical decision making of at least  moderate complexity. The above documentation has been reviewed to be accurate and was completed by [name], D.O.      --------------------------------------------------------------------------------------------------------------------------------------------------------------------------------------------------------------------------------------------    Subjective:    Kyle Simon, am serving as scribe for Dr. Mellody Dance.  CC:  Chief Complaint  Patient presents with  . Hyperlipidemia  . Hypertension  . Hypothyroidism    HPI: Kyle Simon is a 86 y.o. male who presents to [place] at [facility] today for issues as discussed below.  - Sebaceous Cyst Says he noticed the bump on his back because he could see it over his shoulder.  Notes "a big swelling back there."  His wife came and looked at it, and said "I don't know what it is."  They watched the area for three days, and he called in on Monday.  Notes "it doesn't hurt or anything."  Denies getting bitten by a bug or anything.  Denies tingling, pain, or unusual sensation down his arm.  Patient notes he has seen Dr. Denna Haggard of dermatology for many years.  He regularly sees dermatology, with his last appointment this past December.  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  - Flatulence & Bowel Concerns Patient is in the process of scheduling an appointment with a specialist because he is experiencing significant difficulty with his flatulence.  States he can no longer stand up to urinate, because when he has to pass gas, sometimes he loses stool in addition.  - Chronic Concerns Notes his only chronic concern is that he feels tired / exhausted several days per week.  Notes his BP and blood oxygen are fine at home.  At home, his BP runs between 135-140, over 70's, "never gets up to 80."  Says his pulse stays normal and his fasting sugars are 97.  Dr. Dwyane Dee has placed the patient on a salt tablet daily.  Notes "It doesn't go down as  well as it should and I can taste it."  They recently got a new puppy.  He has obtained his first COVID-19 vaccine.    No problems updated.   Wt Readings from Last 3 Encounters:  03/21/19 198 lb 1.6 oz (89.9 kg)  02/13/19 188 lb (85.3 kg)  01/23/19 196 lb (88.9 kg)   BP Readings from Last 3 Encounters:  03/21/19 (!) 144/73  02/13/19 129/72  01/23/19 130/68   BMI Readings from Last 3 Encounters:  03/21/19 28.42 kg/m  02/13/19 26.98 kg/m  01/23/19 28.12 kg/m     Patient Care Team    Relationship Specialty Notifications Start End  Mellody Dance, DO PCP - General Family Medicine  08/05/15   Rana Snare, MD Consulting Physician Urology  11/10/15   Lavonna Monarch, MD Consulting Physician Dermatology  [id number]   Jodi Marble, MD Consulting Physician Otolaryngology  11/10/15   Calton Dach, MD Referring Physician Optometry  11/10/15   Inocencio Homes, [place] Consulting Physician Podiatry  11/10/15   Bo Merino, MD Consulting Physician Rheumatology  11/10/15   Vickey Huger, MD Consulting Physician Orthopedic Surgery  11/10/15   Erline Levine, MD Consulting Physician Neurosurgery  11/10/15   Deboraha Sprang, MD Consulting Physician Cardiology  01/04/16    Comment: seen '12 last  Elayne Snare, MD Consulting Physician Endocrinology  04/14/17   Kathrynn Ducking, MD Consulting Physician Neurology  [id number]   Jodi Marble, MD Consulting Physician Otolaryngology  04/22/17   Juanita Craver, MD Consulting Physician Gastroenterology  11/19/18    Comment: whom he has seen sev times in past for his chronic GI sx  Milus Banister, MD Attending Physician Gastroenterology  02/13/19      Patient Active Problem List   Diagnosis Date Noted  . Statin declined-  11/20/2018  . History of noncompliance with medical treatment, presenting hazards to health 11/20/2018  . Stressful life event affecting family-loss on May 31st 08/22/2017  . DM assoc with CKD (chronic kidney disease), stage III  ([place]) 04/14/2017  . Diabetes mellitus without complication ([place]) [id number]  . Hypertension associated with diabetes ([place]) 03/20/2014  . Hyperlipidemia associated with type 2 diabetes mellitus ([place]) 04/19/2007  . H/O total knee replacement, bilateral 11/22/2016  . High risk medications (not anticoagulants) long-term use 01/04/2016  . Hypomagnesemia 12/29/2015  . h/o Hyponatremia 12/29/2015  . BPH (benign prostatic hyperplasia) 04/18/2007  . Hypothyroidism 04/20/2006  . Age-related osteoporosis without current pathological fracture 07/16/2016  . Flatulence symptom 07/25/2015  . Diarrhea 06/21/2015  . B12 deficiency anemia 01/22/2013  . Anemia, unspecified 08/13/2012  . GERD (gastroesophageal reflux disease) 03/26/2011  . SIADH (syndrome of inappropriate ADH production) ([place]) 03/21/2019  . Flatulence 11/29/2018  . Elevated coronary artery calcium score 11/25/2018  . SOB (shortness of breath) 10/05/2017  . Aortic atherosclerosis ([place]) 10/05/2017  . Fatigue 08/22/2017  . Chronic pain of right knee 08/22/2017  . Inactivity-recently due to knee pain 08/22/2017  . Lumbar radiculopathy 06/16/2017  . Total knee replacement status, right 04/14/2017  . Arthralgia of right temporomandibular joint 03/02/2017  . Chronic eczematous otitis externa of both ears 03/02/2017  . Presbycusis of both ears 03/02/2017  . ETD (Eustachian tube dysfunction), right 09/15/2016  . Strain of masseter muscle 09/15/2016  . Trochanteric bursitis of right hip 09/07/2016  . History of lumbar fusion 07/16/2016  . History of total knee arthroplasty, left 07/16/2016  . Cough 01/04/2016  . Cerumen impaction 08/25/2015  . Constipation 08/06/2015  . Chronic rhinitis 11/01/2014  . Spondylolisthesis of lumbar region 10/23/2013  . Diverticulosis of colon without hemorrhage 01/22/2013  . Synovial cyst of lumbar spine 07/26/2011  . Spasms of the hands or feet 07/26/2011  . SENILE KERATOSIS 06/12/2008  . SNORING, HX OF  04/19/2007      Past Medical History:  Diagnosis Date  . Arthritis    both hands;Dr.Deveshwar  . Constipation due to pain medication   . Diabetes mellitus without complication ([place])    Type II. Uses no medication. Pt controls with diet and weight  . Enlarged prostate   . Frequent urination at night   . GERD (gastroesophageal reflux disease)   . History of noncompliance with medical treatment, presenting hazards to health 11/20/2018  . Hyperlipidemia   . Hyperlipidemia associated with type 2 diabetes mellitus ([place]) 04/19/2007   Qualifier: Diagnosis of  By: Linna Darner MD, Gwyndolyn Saxon    . Hypertension   . Hypothyroidism   . Polio    as a child w/o complications  . Snoring    no sleep apnea  . Statin declined-  11/20/2018   patient understands risks associated with this decision and has been advised to restart by cardiologist as well as myself several times  . Torn meniscus   . Transfusion history 2012   post TKR     Past Surgical History:  Procedure Laterality Date  . ANTERIOR LAT LUMBAR FUSION Right 11/03/2017   Procedure: Right Lumbar three-four Anterolateral lumbar interbody fusion with lateral  plate;  Surgeon: Erline Levine, MD;  Location: [place];  Service: Neurosurgery;  Laterality: Right;  . BACK SURGERY  2011   hemiarthroplasty 01/11/1999 and 6  . CARDIAC CATHETERIZATION  09/2001   negative  . COLONOSCOPY    . EYE SURGERY  2003   R&L cataract removed & IOL- 2003 & 2007,Dr.Jenkins  . INGUINAL HERNIA REPAIR  2002   right, Dr. Truitt Leep  . JOINT REPLACEMENT  2012   left knee replacement  . LIPOMA EXCISION     back  . MENISCECTOMY Bilateral    Dr. Noemi Chapel  . PROSTATE SURGERY  07/2006   for urinary urgency, Dr. Risa Grill  . ROTATOR CUFF REPAIR Right October, 2016  . sinus & throat surgery-1995  1995  . TONSILLECTOMY    . TOTAL KNEE ARTHROPLASTY  01/11/2011   Procedure: TOTAL KNEE ARTHROPLASTY;  Surgeon: Rudean Haskell, MD;  Location: [place];  Service: Orthopedics;  Laterality:  Left;  . TOTAL KNEE ARTHROPLASTY Right 11/22/2016   Procedure: TOTAL KNEE ARTHROPLASTY;  Surgeon: Vickey Huger, MD;  Location: [place];  Service: Orthopedics;  Laterality: Right;  . UVULECTOMY  [id number]   Dr. Erik Obey  . VASECTOMY    . WRIST SURGERY  1959   fracture- right     Family History  Problem Relation Age of Onset  . Sudden death Father        MVA  . Coronary artery disease Mother   . Emphysema Mother   . Diverticulosis Mother   . Thyroid disease Mother   . Asthma Brother   . Bone cancer Brother   . Diabetes Paternal Grandmother   . Sarcoidosis Son   . Thyroid disease Daughter   . Anesthesia problems Neg Hx   . Hypotension Neg Hx   . Malignant hyperthermia Neg Hx   . Pseudochol deficiency Neg Hx   . Colon cancer Neg Hx      Social History   Socioeconomic History  . Marital status: Married    Spouse name: Not on file  . Number of children: Not on file  . Years of education: Not on file  . Highest education level: Not on file  Occupational History  . Not on file  Tobacco Use  . Smoking status: Former Smoker    Packs/day: 1.00    Years: 18.00    Pack years: 18.00    Quit date: 03/01/1966    Years since quitting: 53.0  . Smokeless tobacco: Never Used  Substance and Sexual Activity  . Alcohol use: Yes    Alcohol/week: 4.0 standard drinks    Types: 4 Shots of liquor per week    Comment: social, few drinks a week  . Drug use: No  . Sexual activity: Yes  Other Topics Concern  . Not on file  Social History Narrative  . Not on file   Social Determinants of Health   Financial Resource Strain:   . Difficulty of Paying Living Expenses: Not on file  Food Insecurity:   . Worried About Charity fundraiser in the Last Year: Not on file  . Ran Out of Food in the Last Year: Not on file  Transportation Needs:   . Lack of Transportation (Medical): Not on file  . Lack of Transportation (Non-Medical): Not on file  Physical Activity:   . Days of Exercise per Week: Not on  file  . Minutes of Exercise per Session: Not on file  Stress:   . Feeling of Stress : Not on  file  Social Connections:   . Frequency of Communication with Friends and Family: Not on file  . Frequency of Social Gatherings with Friends and Family: Not on file  . Attends Religious Services: Not on file  . Active Member of Clubs or Organizations: Not on file  . Attends Archivist Meetings: Not on file  . Marital Status: Not on file  Intimate Partner Violence:   . Fear of Current or Ex-Partner: Not on file  . Emotionally Abused: Not on file  . Physically Abused: Not on file  . Sexually Abused: Not on file     Current Meds  Medication Sig  . acetaminophen (TYLENOL) 500 MG tablet Take 1,000 mg by mouth every 6 (six) hours as needed for mild pain.   Marland Kitchen aspirin 81 MG tablet Take 81 mg by mouth every evening.   . Calcium Carb-Cholecalciferol (CALCIUM 600 + D PO) Take 1 tablet by mouth daily.   Marland Kitchen glucose blood (PRECISION XTRA TEST STRIPS) test strip USE TO CHECK BLOOD SUGAR ONCE DAILY DX E11.9  . Hypromellose (ARTIFICIAL TEARS OP) Place 1 drop into both eyes 2 (two) times daily.  Marland Kitchen LANCETS ULTRA THIN 30G MISC Patient is to use to check blood glucose in the AM fasting and 2 hours after largest meal.  . losartan (COZAAR) 50 MG tablet Take 1 tablet (50 mg total) by mouth daily. **PATIENT NEEDS APT FOR FURTHER REFILLS**  . Multiple Vitamin (MULTIVITAMIN WITH MINERALS) TABS Take 1 tablet by mouth daily.  . NONFORMULARY OR COMPOUNDED ITEM Glucometer Museum/gallery curator XTRA- Model #[id number])  . Omega-3 Fatty Acids (FISH OIL) 1000 MG CAPS Take 1,000 mg by mouth every evening.  . Probiotic Product (RESTORA) CAPS One tab qd (Patient taking differently: Take 1 capsule by mouth every evening. )  . rosuvastatin (CRESTOR) 5 MG tablet Take 1 tablet (5 mg total) by mouth at bedtime.  Marland Kitchen SYNTHROID 150 MCG tablet TAKE 1 TABLET DAILY BEFORE BREAKFAST  . Tamsulosin HCl (FLOMAX) 0.4 MG CAPS Take 0.4 mg by mouth  2 (two) times daily.   . vitamin B-12 (CYANOCOBALAMIN) 1000 MCG tablet Take 1,000 mcg by mouth daily.    Allergies:  Allergies  Allergen Reactions  . Simvastatin Other (See Comments)    MYALGIAS  . Oxycodone Other (See Comments)    Severe constipation     Review of Systems: General:   Denies fever, chills, unexplained weight loss.  Optho/Auditory:   Denies visual changes, blurred vision/LOV Respiratory:   Denies wheeze, DOE more than baseline levels.   Cardiovascular:   Denies chest pain, palpitations, new onset peripheral edema  Gastrointestinal:   Denies nausea, vomiting, diarrhea, abd pain.  Genitourinary: Denies dysuria, freq/ urgency, flank pain or discharge from genitals.  Endocrine:     Denies hot or cold intolerance, polyuria, polydipsia. Musculoskeletal:   Denies unexplained myalgias, joint swelling, unexplained arthralgias, gait problems.  Skin:  Denies new onset rash, suspicious lesions Neurological:     Denies dizziness, unexplained weakness, numbness  Psychiatric/Behavioral:   Denies mood changes, suicidal or homicidal ideations, hallucinations    Objective:   Blood pressure (!) 144/73, pulse 99, temperature 97.7 F (36.5 C), temperature source Oral, resp. rate 12, height 5\' 10"  (1.778 m), weight 198 lb 1.6 oz (89.9 kg), SpO2 100 %. Body mass index is 28.42 kg/m. General:  Well Developed, well nourished, appropriate for stated age.  Neuro:  Alert and oriented,  extra-ocular muscles intact  HEENT:  Normocephalic, atraumatic, neck supple Skin: patient with  noticeable sub-q baseball sized enlargement of his upper left back in the paravertebral region around C7-T1, which is centrally fluctuant, with no surrounding erythema, no increased warmth or redness or abnormality, the size of a baseball. no gross rash, warm, pink. Cardiac:  RRR, S1 S2 Respiratory:  ECTA B/L and A/P, Not using accessory muscles, speaking in full sentences- unlabored. Vascular:  Ext warm, no  cyanosis apprec.; cap RF less 2 sec. Psych:  No HI/SI, judgement and insight good, Euthymic mood. Full Affect.

## 2019-03-22 LAB — BASIC METABOLIC PANEL
BUN/Creatinine Ratio: 15 (ref 10–24)
BUN: 14 mg/dL (ref 8–27)
CO2: 24 mmol/L (ref 20–29)
Calcium: 9.3 mg/dL (ref 8.6–10.2)
Chloride: 93 mmol/L — ABNORMAL LOW (ref 96–106)
Creatinine, Ser: 0.95 mg/dL (ref 0.76–1.27)
GFR calc Af Amer: 85 mL/min/{1.73_m2} (ref >59)
GFR calc non Af Amer: 73 mL/min/{1.73_m2} (ref >59)
Glucose: 162 mg/dL — ABNORMAL HIGH (ref 65–99)
Potassium: 4.9 mmol/L (ref 3.5–5.2)
Sodium: 130 mmol/L — ABNORMAL LOW (ref 134–144)

## 2019-03-27 ENCOUNTER — Encounter: Payer: Self-pay | Admitting: Gastroenterology

## 2019-03-27 ENCOUNTER — Ambulatory Visit (INDEPENDENT_AMBULATORY_CARE_PROVIDER_SITE_OTHER): Payer: Medicare Other | Admitting: Gastroenterology

## 2019-03-27 VITALS — BP 122/60 | HR 58 | Temp 97.3°F | Ht 70.0 in | Wt 195.0 lb

## 2019-03-27 DIAGNOSIS — K5900 Constipation, unspecified: Secondary | ICD-10-CM | POA: Diagnosis not present

## 2019-03-27 NOTE — Patient Instructions (Signed)
If you are age 84 or older, your body mass index should be between 23-30. Your Body mass index is 27.98 kg/m. If this is out of the aforementioned range listed, please consider follow up with your Primary Care Provider.  If you are age 16 or younger, your body mass index should be between 19-25. Your Body mass index is 27.98 kg/m. If this is out of the aformentioned range listed, please consider follow up with your Primary Care Provider.   Please purchase the following medications over the counter and take as directed:  Please take Citrucel orange flavor powder 1 large scoop daily.  Please call our office in 6 - 7 weeks to let us know how Citrucel is working.  Thank you, Dr Ardis Hughs

## 2019-03-27 NOTE — Progress Notes (Signed)
Review of pertinent gastrointestinal problems: 1.  Routine risk for colon cancer.  Colonoscopy Dr. Ardis Hughs March 2014 showed diverticulosis.  HPI: This is a very pleasant 86 year old man  He was here in our office 4 or 5 months ago and he saw [place].  At that time he was complaining of constipation and borborygmi.  He had recently been started on MiraLAX and that was clearly helping with some mild abdominal pains and his constipation.  He was still having quite a lot of gas however.  Since then he has tapered the MiraLAX dosing so that he takes 1 dose only about 2 or 3 times a month.  He generally will take it only if he has not had a bowel movement and 2 or 3 days.  His bowel movements tend to be time-consuming, pellet-like stools only, mucousy, he has a feeling of incomplete evacuation.  He never sees blood in his stool  He is bothered that sometimes if he passes gas he will have some fecal incontinence at the same time.  His weight is up 3 or 4 pounds in the past several months.  His gas pains have improved since he started taking Gas-X at bedtime every night 1 pill   ROS: complete GI ROS as described in HPI, all other review negative.  Constitutional:  No unintentional weight loss   Past Medical History:  Diagnosis Date  . Arthritis    both hands;Dr.Deveshwar  . Constipation due to pain medication   . Diabetes mellitus without complication ([place])    Type II. Uses no medication. Pt controls with diet and weight  . Enlarged prostate   . Frequent urination at night   . GERD (gastroesophageal reflux disease)   . History of noncompliance with medical treatment, presenting hazards to health 11/20/2018  . Hyperlipidemia   . Hyperlipidemia associated with type 2 diabetes mellitus ([place]) 04/19/2007   Qualifier: Diagnosis of  By: Linna Darner MD, Gwyndolyn Saxon    . Hypertension   . Hypothyroidism   . Polio    as a child w/o complications  . Snoring    no sleep apnea  . Statin declined-   11/20/2018   patient understands risks associated with this decision and has been advised to restart by cardiologist as well as myself several times  . Torn meniscus   . Transfusion history 2012   post TKR    Past Surgical History:  Procedure Laterality Date  . ANTERIOR LAT LUMBAR FUSION Right 11/03/2017   Procedure: Right Lumbar three-four Anterolateral lumbar interbody fusion with lateral plate;  Surgeon: Erline Levine, MD;  Location: [facility];  Service: Neurosurgery;  Laterality: Right;  . BACK SURGERY  2011   hemiarthroplasty 01/11/1999 and 6  . CARDIAC CATHETERIZATION  09/2001   negative  . COLONOSCOPY    . EYE SURGERY  2003   R&L cataract removed & IOL- 2003 & 2007,Dr.Jenkins  . INGUINAL HERNIA REPAIR  2002   right, Dr. Truitt Leep  . JOINT REPLACEMENT  2012   left knee replacement  . LIPOMA EXCISION     back  . MENISCECTOMY Bilateral    Dr. Noemi Chapel  . PROSTATE SURGERY  07/2006   for urinary urgency, Dr. Risa Grill  . ROTATOR CUFF REPAIR Right October, 2016  . sinus & throat surgery-1995  1995  . TONSILLECTOMY    . TOTAL KNEE ARTHROPLASTY  01/11/2011   Procedure: TOTAL KNEE ARTHROPLASTY;  Surgeon: Rudean Haskell, MD;  Location: [place] [facility];  Service: Orthopedics;  Laterality: Left;  .  TOTAL KNEE ARTHROPLASTY Right 11/22/2016   Procedure: TOTAL KNEE ARTHROPLASTY;  Surgeon: Vickey Huger, MD;  Location: [place];  Service: Orthopedics;  Laterality: Right;  . UVULECTOMY  [id number]   Dr. Erik Obey  . VASECTOMY    . WRIST SURGERY  1959   fracture- right    Current Outpatient Medications  Medication Sig Dispense Refill  . acetaminophen (TYLENOL) 500 MG tablet Take 1,000 mg by mouth every 6 (six) hours as needed for mild pain.     Marland Kitchen aspirin 81 MG tablet Take 81 mg by mouth every evening.     . Calcium Carb-Cholecalciferol (CALCIUM 600 + D PO) Take 1 tablet by mouth daily.     Marland Kitchen glucose blood (PRECISION XTRA TEST STRIPS) test strip USE TO CHECK BLOOD SUGAR ONCE DAILY DX E11.9 100 each 2  .  Hypromellose (ARTIFICIAL TEARS OP) Place 1 drop into both eyes 2 (two) times daily.    Marland Kitchen LANCETS ULTRA THIN 30G MISC Patient is to use to check blood glucose in the AM fasting and 2 hours after largest meal. 100 each 6  . losartan (COZAAR) 50 MG tablet Take 1 tablet (50 mg total) by mouth daily. **PATIENT NEEDS APT FOR FURTHER REFILLS** 30 tablet 0  . Multiple Vitamin (MULTIVITAMIN WITH MINERALS) TABS Take 1 tablet by mouth daily.    . NONFORMULARY OR COMPOUNDED ITEM Glucometer (Precision XTRA- Model #XCC [phone]) 1 each 0  . Omega-3 Fatty Acids (FISH OIL) 1000 MG CAPS Take 1,000 mg by mouth every evening.    . Probiotic Product (RESTORA) CAPS One tab qd (Patient taking differently: Take 1 capsule by mouth every evening. ) 90 capsule 3  . rosuvastatin (CRESTOR) 5 MG tablet Take 1 tablet (5 mg total) by mouth at bedtime. 90 tablet 0  . SYNTHROID 150 MCG tablet TAKE 1 TABLET DAILY BEFORE BREAKFAST 90 tablet 4  . Tamsulosin HCl (FLOMAX) 0.4 MG CAPS Take 0.4 mg by mouth 2 (two) times daily.     . vitamin B-12 (CYANOCOBALAMIN) 1000 MCG tablet Take 1,000 mcg by mouth daily.     No current facility-administered medications for this visit.    Allergies as of 03/27/2019 - Review Complete 03/27/2019  Allergen Reaction Noted  . Simvastatin Other (See Comments) 04/18/2007  . Oxycodone Other (See Comments) 10/11/2013    Family History  Problem Relation Age of Onset  . Sudden death Father        MVA  . Coronary artery disease Mother   . Emphysema Mother   . Diverticulosis Mother   . Thyroid disease Mother   . Asthma Brother   . Bone cancer Brother   . Diabetes Paternal Grandmother   . Sarcoidosis Son   . Thyroid disease Daughter   . Anesthesia problems Neg Hx   . Hypotension Neg Hx   . Malignant hyperthermia Neg Hx   . Pseudochol deficiency Neg Hx   . Colon cancer Neg Hx     Social History   Socioeconomic History  . Marital status: Married    Spouse name: Not on file  . Number of  children: Not on file  . Years of education: Not on file  . Highest education level: Not on file  Occupational History  . Not on file  Tobacco Use  . Smoking status: Former Smoker    Packs/day: 1.00    Years: 18.00    Pack years: 18.00    Quit date: 03/01/1966    Years since quitting: 53.1  . Smokeless  tobacco: Never Used  Substance and Sexual Activity  . Alcohol use: Yes    Alcohol/week: 4.0 standard drinks    Types: 4 Shots of liquor per week    Comment: social, few drinks a week  . Drug use: No  . Sexual activity: Yes  Other Topics Concern  . Not on file  Social History Narrative  . Not on file   Social Determinants of Health   Financial Resource Strain:   . Difficulty of Paying Living Expenses: Not on file  Food Insecurity:   . Worried About Charity fundraiser in the Last Year: Not on file  . Ran Out of Food in the Last Year: Not on file  Transportation Needs:   . Lack of Transportation (Medical): Not on file  . Lack of Transportation (Non-Medical): Not on file  Physical Activity:   . Days of Exercise per Week: Not on file  . Minutes of Exercise per Session: Not on file  Stress:   . Feeling of Stress : Not on file  Social Connections:   . Frequency of Communication with Friends and Family: Not on file  . Frequency of Social Gatherings with Friends and Family: Not on file  . Attends Religious Services: Not on file  . Active Member of Clubs or Organizations: Not on file  . Attends Archivist Meetings: Not on file  . Marital Status: Not on file  Intimate Partner Violence:   . Fear of Current or Ex-Partner: Not on file  . Emotionally Abused: Not on file  . Physically Abused: Not on file  . Sexually Abused: Not on file     Physical Exam: BP 122/60   Pulse (!) 58   Temp (!) 97.3 F (36.3 C)   Ht 5\' 10"  (1.778 m)   Wt 195 lb (88.5 kg)   BMI 27.98 kg/m  Constitutional: generally well-appearing Psychiatric: alert and oriented x3 Abdomen: soft,  nontender, nondistended, no obvious ascites, no peritoneal signs, normal bowel sounds No peripheral edema noted in lower extremities  Assessment and plan: 86 y.o. male with pellet-like stools, fecal incontinence at times  He is quite bothered at times by fecal incontinence if he passes gas or when he urinates.  I think a lot of this is falling down to incomplete evacuation mild chronic constipation.  He has pellet-like stools only and that is generally after a lot of pushing and straining.  I recommended a trial of Citrucel fiber supplement.  He will slowly taper up to a large heaping spoonful once daily.  He understands it can take several days for this to take effect.  My goal will be a larger, soft bulkier stool that is easy to clean and leads to complete evacuation and that is generally possible fiber supplements.  You call to report on his response in 6 or 7 weeks  Please see the "Patient Instructions" section for addition details about the plan.  Owens Loffler, MD [facility] 03/27/2019, 8:48 AM   Total time on date of encounter was 30 minutes (this included time spent preparing to see the patient reviewing records; obtaining and/or reviewing separately obtained history; performing a medically appropriate exam and/or evaluation; counseling and educating the patient and family if present; ordering medications, tests or procedures if applicable; and documenting clinical information in the health record).

## 2019-04-01 ENCOUNTER — Ambulatory Visit: Payer: Medicare Other

## 2019-04-01 ENCOUNTER — Ambulatory Visit: Payer: Medicare Other | Attending: Internal Medicine

## 2019-04-01 DIAGNOSIS — Z23 Encounter for immunization: Secondary | ICD-10-CM | POA: Insufficient documentation

## 2019-04-01 NOTE — Progress Notes (Signed)
   Covid-19 Vaccination Clinic  Name:  Kyle Simon    MRN: [id number] DOB: 1932-09-28  04/01/2019  [name] was observed post Covid-19 immunization for 15 minutes without incidence. He was provided with Vaccine Information Sheet and instruction to access the V-Safe system.   [name] was instructed to call 911 with any severe reactions post vaccine: Marland Kitchen Difficulty breathing  . Swelling of your face and throat  . A fast heartbeat  . A bad rash all over your body  . Dizziness and weakness    Immunizations Administered    Name Date Dose VIS Date Route   Pfizer COVID-19 Vaccine 04/01/2019 11:26 AM 0.3 mL 02/09/2019 Intramuscular   Manufacturer: [place]   Lot: [id number]   [place]: [id number]

## 2019-04-05 ENCOUNTER — Telehealth: Payer: Self-pay | Admitting: Family Medicine

## 2019-04-05 NOTE — Telephone Encounter (Signed)
Patient cld says wanted Dr. Jenetta Downer tp know he was unable to get dermatology referral appt till 3/15 & wanted here to opinion if this is acceptable.  --glh

## 2019-04-05 NOTE — Telephone Encounter (Signed)
Please advise. AS, CMA 

## 2019-04-06 ENCOUNTER — Telehealth: Payer: Self-pay | Admitting: Family Medicine

## 2019-04-06 NOTE — Telephone Encounter (Signed)
Patient called states he received a bill from [place] for DOS 02/13/2019-- He has 2 insurances ([employer] ) Wishes bill to be reviewed for Coding  corrections & resubmitted to insurance companies.  --Forwarding request to suprv. Rollene Rotunda

## 2019-04-06 NOTE — Telephone Encounter (Signed)
That's fine - this is not acute

## 2019-04-06 NOTE — Telephone Encounter (Signed)
Patient is aware this is fine and states apt is actually in February. AS, CMA

## 2019-04-07 ENCOUNTER — Ambulatory Visit: Payer: Medicare Other

## 2019-04-12 ENCOUNTER — Other Ambulatory Visit: Payer: Self-pay | Admitting: Family Medicine

## 2019-04-12 DIAGNOSIS — E1159 Type 2 diabetes mellitus with other circulatory complications: Secondary | ICD-10-CM

## 2019-04-12 DIAGNOSIS — I152 Hypertension secondary to endocrine disorders: Secondary | ICD-10-CM

## 2019-04-12 MED ORDER — LOSARTAN POTASSIUM 50 MG PO TABS: 50.0000 mg | ORAL_TABLET | Freq: Every day | ORAL | 1 refills | Status: DC

## 2019-04-12 NOTE — Telephone Encounter (Signed)
Patient called states had OV w/ provider 03/21/19-- Pt's wishes 90dys refills on :    losartan (COZAAR) 50 MG tablet [id number] DISCONTINUED  Order Details Dose, Route, Frequency: As Directed  Dispense Quantity: 90 tablet Refills: 1       Sig: TAKE 1 TABLET DAILY   ---Forwarding request to medical asst that if approved send refill order to :   [facility], [place] [phone] (Phone) [phone] (Fax)   --Dion Body

## 2019-04-12 NOTE — Telephone Encounter (Signed)
Refill sent to pharmacy. AS, CMA 

## 2019-04-16 ENCOUNTER — Other Ambulatory Visit: Payer: Self-pay | Admitting: Dermatology

## 2019-04-16 DIAGNOSIS — D1739 Benign lipomatous neoplasm of skin and subcutaneous tissue of other sites: Secondary | ICD-10-CM | POA: Diagnosis not present

## 2019-04-16 DIAGNOSIS — L570 Actinic keratosis: Secondary | ICD-10-CM | POA: Diagnosis not present

## 2019-04-16 DIAGNOSIS — D485 Neoplasm of uncertain behavior of skin: Secondary | ICD-10-CM | POA: Diagnosis not present

## 2019-05-01 DIAGNOSIS — Z96652 Presence of left artificial knee joint: Secondary | ICD-10-CM | POA: Diagnosis not present

## 2019-05-01 DIAGNOSIS — Z96651 Presence of right artificial knee joint: Secondary | ICD-10-CM | POA: Diagnosis not present

## 2019-05-08 ENCOUNTER — Other Ambulatory Visit: Payer: Self-pay | Admitting: Family Medicine

## 2019-05-08 DIAGNOSIS — E785 Hyperlipidemia, unspecified: Secondary | ICD-10-CM

## 2019-05-08 DIAGNOSIS — E1169 Type 2 diabetes mellitus with other specified complication: Secondary | ICD-10-CM

## 2019-05-08 MED ORDER — ROSUVASTATIN CALCIUM 5 MG PO TABS: 5.0000 mg | ORAL_TABLET | Freq: Every day | ORAL | 0 refills | Status: DC

## 2019-05-08 NOTE — Telephone Encounter (Signed)
Patient called states he needs his meds to be an automatic (1) yr refill when requested, says the govt will get cheaper prices when large quanity ordered of  :   rosuvastatin (CRESTOR) 5 MG tablet [id number] DISCONTINUED  Order Details Dose: 5 mg Route: Oral Frequency: Daily at bedtime  Dispense Quantity: 90 tablet Refills: 1       Sig: Take 1 tablet (5 mg total) by mouth at bedtime.    ---Per pt he previous was taking 1//2(half a tab) which lasted a year (with the 90dys,plus 1 refill) --- but Dosage was increase to (1) whole tab -decreased the supply to (40month) instead of a year.   --Forwarding request to med asst to send refill order for (1 year) to  : [facility]      [facility], [place] [phone] (Phone) [phone] (Fax)    --Please contact pt if there are any questions  --glh

## 2019-05-08 NOTE — Telephone Encounter (Signed)
90 day supply sent to [facility]. AS, CMA

## 2019-05-10 DIAGNOSIS — M25562 Pain in left knee: Secondary | ICD-10-CM | POA: Diagnosis not present

## 2019-05-10 DIAGNOSIS — M25561 Pain in right knee: Secondary | ICD-10-CM | POA: Diagnosis not present

## 2019-05-10 DIAGNOSIS — R2689 Other abnormalities of gait and mobility: Secondary | ICD-10-CM | POA: Diagnosis not present

## 2019-05-14 ENCOUNTER — Telehealth: Payer: Self-pay | Admitting: Gastroenterology

## 2019-05-14 NOTE — Telephone Encounter (Signed)
Please advise 

## 2019-05-15 MED ORDER — LINACLOTIDE 72 MCG PO CAPS: 72.0000 ug | ORAL_CAPSULE | Freq: Every day | ORAL | 5 refills | Status: DC

## 2019-05-15 NOTE — Telephone Encounter (Signed)
Spoke with patient and informed him to continue his fiber and we are sending in [place] to see if that also helps. Appointment scheduled. Script sent into pharmacy.

## 2019-05-15 NOTE — Telephone Encounter (Signed)
Yes, he should continue the fiber supplement.  Also advise he concentrate on staying hydrated throughout the day.  Lets also try a single low dose linzess once daily 77mcg pill, disp 30 with 5 refills.  rov with me in 6-7 weeks to see how he is doing.

## 2019-05-16 DIAGNOSIS — M25561 Pain in right knee: Secondary | ICD-10-CM | POA: Diagnosis not present

## 2019-05-16 DIAGNOSIS — M25562 Pain in left knee: Secondary | ICD-10-CM | POA: Diagnosis not present

## 2019-05-16 DIAGNOSIS — R2689 Other abnormalities of gait and mobility: Secondary | ICD-10-CM | POA: Diagnosis not present

## 2019-05-18 DIAGNOSIS — M25562 Pain in left knee: Secondary | ICD-10-CM | POA: Diagnosis not present

## 2019-05-18 DIAGNOSIS — R2689 Other abnormalities of gait and mobility: Secondary | ICD-10-CM | POA: Diagnosis not present

## 2019-05-18 DIAGNOSIS — M25561 Pain in right knee: Secondary | ICD-10-CM | POA: Diagnosis not present

## 2019-05-22 ENCOUNTER — Telehealth: Payer: Self-pay | Admitting: Gastroenterology

## 2019-05-22 NOTE — Telephone Encounter (Signed)
Patient called states his ins requires auth for Linzess

## 2019-05-22 NOTE — Telephone Encounter (Signed)
Left message for patient to return call to discuss prior authorization. Will continue efforts.   Linzess has been approved: [id number] 04-22-2019 - 05-21-2020.

## 2019-05-22 NOTE — Telephone Encounter (Signed)
Patient returned call and was advised that Linzess has been approved. Patient verbalized understanding.

## 2019-05-29 DIAGNOSIS — M25561 Pain in right knee: Secondary | ICD-10-CM | POA: Diagnosis not present

## 2019-05-29 DIAGNOSIS — M25562 Pain in left knee: Secondary | ICD-10-CM | POA: Diagnosis not present

## 2019-05-29 DIAGNOSIS — R2689 Other abnormalities of gait and mobility: Secondary | ICD-10-CM | POA: Diagnosis not present

## 2019-05-31 ENCOUNTER — Telehealth: Payer: Self-pay | Admitting: Gastroenterology

## 2019-05-31 DIAGNOSIS — R2689 Other abnormalities of gait and mobility: Secondary | ICD-10-CM | POA: Diagnosis not present

## 2019-05-31 DIAGNOSIS — M25562 Pain in left knee: Secondary | ICD-10-CM | POA: Diagnosis not present

## 2019-05-31 NOTE — Telephone Encounter (Signed)
The pt was put on Linzess for constipation and has now developed diarrhea.  He was advised to stop the linzess and call back if his diarrhea does not stop or if his constipation returns.  The pt has been advised of the information and verbalized understanding.   He is also going to call to make a follow up with Dr Ardis Hughs

## 2019-06-04 DIAGNOSIS — L603 Nail dystrophy: Secondary | ICD-10-CM | POA: Diagnosis not present

## 2019-06-04 DIAGNOSIS — I739 Peripheral vascular disease, unspecified: Secondary | ICD-10-CM | POA: Diagnosis not present

## 2019-06-04 DIAGNOSIS — E1151 Type 2 diabetes mellitus with diabetic peripheral angiopathy without gangrene: Secondary | ICD-10-CM | POA: Diagnosis not present

## 2019-06-04 DIAGNOSIS — L84 Corns and callosities: Secondary | ICD-10-CM | POA: Diagnosis not present

## 2019-06-04 DIAGNOSIS — B351 Tinea unguium: Secondary | ICD-10-CM | POA: Diagnosis not present

## 2019-06-05 DIAGNOSIS — M25562 Pain in left knee: Secondary | ICD-10-CM | POA: Diagnosis not present

## 2019-06-05 DIAGNOSIS — R2689 Other abnormalities of gait and mobility: Secondary | ICD-10-CM | POA: Diagnosis not present

## 2019-06-06 ENCOUNTER — Encounter: Payer: Self-pay | Admitting: Family Medicine

## 2019-06-06 ENCOUNTER — Other Ambulatory Visit: Payer: Self-pay

## 2019-06-06 ENCOUNTER — Ambulatory Visit (INDEPENDENT_AMBULATORY_CARE_PROVIDER_SITE_OTHER): Payer: Medicare Other | Admitting: Family Medicine

## 2019-06-06 VITALS — BP 128/69 | HR 55 | Temp 97.8°F | Resp 12 | Ht 70.0 in | Wt 197.4 lb

## 2019-06-06 DIAGNOSIS — E039 Hypothyroidism, unspecified: Secondary | ICD-10-CM | POA: Diagnosis not present

## 2019-06-06 DIAGNOSIS — E1159 Type 2 diabetes mellitus with other circulatory complications: Secondary | ICD-10-CM | POA: Diagnosis not present

## 2019-06-06 DIAGNOSIS — E785 Hyperlipidemia, unspecified: Secondary | ICD-10-CM

## 2019-06-06 DIAGNOSIS — E1169 Type 2 diabetes mellitus with other specified complication: Secondary | ICD-10-CM

## 2019-06-06 DIAGNOSIS — I10 Essential (primary) hypertension: Secondary | ICD-10-CM | POA: Diagnosis not present

## 2019-06-06 DIAGNOSIS — E119 Type 2 diabetes mellitus without complications: Secondary | ICD-10-CM | POA: Diagnosis not present

## 2019-06-06 DIAGNOSIS — I152 Hypertension secondary to endocrine disorders: Secondary | ICD-10-CM

## 2019-06-06 LAB — POCT GLYCOSYLATED HEMOGLOBIN (HGB A1C): Hemoglobin A1C: 6.2 % — AB (ref 4.0–5.6)

## 2019-06-06 NOTE — Progress Notes (Signed)
Impression and Recommendations:    1. Diet-controlled diabetes mellitus ([place])   2. Hypertension associated with diabetes ([place])   3. Hyperlipidemia associated with type 2 diabetes mellitus ([place])   4. Hypothyroidism, unspecified type      Diet-controlled diabetes mellitus ([place]) - Plan: POCT glycosylated hemoglobin (Hb A1C)  Hypertension associated with diabetes ([place])  Hyperlipidemia associated with type 2 diabetes mellitus ([place])  Hypothyroidism, unspecified type  1. Diet-controlled diabetes mellitus ([place]) - Home sugars are at goal.  - POCT glycosylated hemoglobin (Hb A1C):  6.2 today and stable - Continue current txmnt plan  - urine microalb WNL's less than 3 mo ago   2. Hypertension associated with diabetes ([facility]) - Continues to be under good control with current regimen.  - Encouraged to check blood pressure at home a few times/wk - on ARB as well.    3. Hyperlipidemia associated with type 2 diabetes mellitus ([place]) - He is taking Crestor and denies SEs. - LDL about 4 mo ago at 49; cont med   4. Hypothyroidism, unspecified type - stable last check less than 33mo ago at 3.35.      Education and routine counseling performed. Handouts provided.  Orders Placed This Encounter  Procedures  . POCT glycosylated hemoglobin (Hb A1C)    The patient was counseled, risk factors were discussed, anticipatory guidance given.  Gross side effects, risk and benefits, and alternatives of medications discussed with patient.  Patient is aware that all medications have potential side effects and we are unable to predict every side effect or drug-drug interaction that may occur.  Expresses verbal understanding and consents to current therapy plan and treatment regimen.   Return for f/up in 4 mo for [facility]   Please see AVS handed out to patient at the end of our visit for further patient instructions/ counseling done pertaining to today's office visit.    Note:  This document was  prepared using Dragon voice recognition software and may include unintentional dictation errors.   The [place] was signed into law in 2016 which includes the topic of electronic health records.  This provides immediate access to information in MyChart.  This includes consultation notes, operative notes, office notes, lab results and pathology reports.  If you have any questions about what you read please let us know at your next visit or call us at the office.  We are right here with you.    This case required medical decision making of at least moderate complexity.       Subjective:    Kyle Simon is a 86 y.o. male who presents to [place] at [facility] today for Diabetes, Hypertension, Hyperlipidemia and Hypothyroidism managment.     DM HPI: -  He has been working on diet and exercise for diabetes.   Pt is currently maintained on the following medications for diabetes:   None   Txmnt  compliance -good  Home glucose readings range 100s-108s   Denies polyuria/polydipsia. Denies hypo/ hyperglycemia symptoms - He denies new onset of: chest pain, exercise intolerance, shortness of breath, dizziness, visual changes, headache, lower extremity swelling or claudication.   Last diabetic eye exam was Lab Results  Component Value Date   HMDIABEYEEXA Retinopathy (A) 02/15/2019   Foot exam- UTD  Last A1C in the office was:  Lab Results  Component Value Date   HGBA1C 6.2 (A) 06/06/2019   HGBA1C 6.3 (H) 02/16/2019   HGBA1C 6.2 (H) 11/15/2018  Lab Results  Component Value Date   MICROALBUR 10 04/14/2017   LDLCALC 49 02/16/2019   CREATININE 0.95 03/21/2019      Last 3 blood pressure readings in our office are as follows: BP Readings from Last 3 Encounters:  06/06/19 128/69  03/27/19 122/60  03/21/19 (!) 144/73    BMI Readings from Last 3 Encounters:  06/06/19 28.32 kg/m  03/27/19 27.98 kg/m  03/21/19 28.42 kg/m     No  problems updated.    Patient Care Team    Relationship Specialty Notifications Start End  Mellody Dance, DO PCP - General Family Medicine  08/05/15   Rana Snare, MD Consulting Physician Urology  11/10/15   Lavonna Monarch, MD Consulting Physician Dermatology  [id number]   Jodi Marble, MD Consulting Physician Otolaryngology  11/10/15   Calton Dach, MD Referring Physician Optometry  11/10/15   Inocencio Homes, [place] Consulting Physician Podiatry  11/10/15   Bo Merino, MD Consulting Physician Rheumatology  11/10/15   Vickey Huger, MD Consulting Physician Orthopedic Surgery  11/10/15   Erline Levine, MD Consulting Physician Neurosurgery  11/10/15   Deboraha Sprang, MD Consulting Physician Cardiology  01/04/16    Comment: seen '12 last  Elayne Snare, MD Consulting Physician Endocrinology  04/14/17   Kathrynn Ducking, MD Consulting Physician Neurology  [id number]   Jodi Marble, MD Consulting Physician Otolaryngology  04/22/17   Juanita Craver, MD Consulting Physician Gastroenterology  11/19/18    Comment: whom he has seen sev times in past for his chronic GI sx  Milus Banister, MD Attending Physician Gastroenterology  02/13/19      Patient Active Problem List   Diagnosis Date Noted  . Statin declined-  11/20/2018  . History of noncompliance with medical treatment, presenting hazards to health 11/20/2018  . Stressful life event affecting family-loss on May 31st 08/22/2017  . DM assoc with CKD (chronic kidney disease), stage III ([place]) 04/14/2017  . Diabetes mellitus without complication ([place]) [id number]  . Hypertension associated with diabetes ([place]) 03/20/2014  . Hyperlipidemia associated with type 2 diabetes mellitus ([place]) 04/19/2007  . H/O total knee replacement, bilateral 11/22/2016  . High risk medications (not anticoagulants) long-term use 01/04/2016  . Hypomagnesemia 12/29/2015  . h/o Hyponatremia 12/29/2015  . BPH (benign prostatic hyperplasia) 04/18/2007  . Hypothyroidism  04/20/2006  . Age-related osteoporosis without current pathological fracture 07/16/2016  . Flatulence symptom 07/25/2015  . Diarrhea 06/21/2015  . B12 deficiency anemia 01/22/2013  . Anemia, unspecified 08/13/2012  . GERD (gastroesophageal reflux disease) 03/26/2011  . SIADH (syndrome of inappropriate ADH production) ([place]) 03/21/2019  . Flatulence 11/29/2018  . Elevated coronary artery calcium score 11/25/2018  . SOB (shortness of breath) 10/05/2017  . Aortic atherosclerosis ([place]) 10/05/2017  . Fatigue 08/22/2017  . Chronic pain of left knee 08/22/2017  . Inactivity-recently due to knee pain 08/22/2017  . Lumbar radiculopathy 06/16/2017  . Total knee replacement status, right 04/14/2017  . Arthralgia of right temporomandibular joint 03/02/2017  . Chronic eczematous otitis externa of both ears 03/02/2017  . Presbycusis of both ears 03/02/2017  . ETD (Eustachian tube dysfunction), right 09/15/2016  . Strain of masseter muscle 09/15/2016  . Trochanteric bursitis of right hip 09/07/2016  . History of lumbar fusion 07/16/2016  . History of total knee arthroplasty, left 07/16/2016  . Cough 01/04/2016  . Cerumen impaction 08/25/2015  . Constipation 08/06/2015  . Chronic rhinitis 11/01/2014  . Spondylolisthesis of lumbar region 10/23/2013  . Diverticulosis of colon without hemorrhage 01/22/2013  .  Synovial cyst of lumbar spine 07/26/2011  . Spasms of the hands or feet 07/26/2011  . SENILE KERATOSIS 06/12/2008  . SNORING, HX OF 04/19/2007     Past Medical History:  Diagnosis Date  . Arthritis    both hands;Dr.Deveshwar  . Constipation due to pain medication   . Diabetes mellitus without complication ([place])    Type II. Uses no medication. Pt controls with diet and weight  . Enlarged prostate   . Frequent urination at night   . GERD (gastroesophageal reflux disease)   . History of noncompliance with medical treatment, presenting hazards to health 11/20/2018  . Hyperlipidemia   .  Hyperlipidemia associated with type 2 diabetes mellitus ([place]) 04/19/2007   Qualifier: Diagnosis of  By: Linna Darner MD, Gwyndolyn Saxon    . Hypertension   . Hypothyroidism   . Polio    as a child w/o complications  . Snoring    no sleep apnea  . Statin declined-  11/20/2018   patient understands risks associated with this decision and has been advised to restart by cardiologist as well as myself several times  . Torn meniscus   . Transfusion history 2012   post TKR     Past Surgical History:  Procedure Laterality Date  . ANTERIOR LAT LUMBAR FUSION Right 11/03/2017   Procedure: Right Lumbar three-four Anterolateral lumbar interbody fusion with lateral plate;  Surgeon: Erline Levine, MD;  Location: [facility];  Service: Neurosurgery;  Laterality: Right;  . BACK SURGERY  2011   hemiarthroplasty 01/11/1999 and 6  . CARDIAC CATHETERIZATION  09/2001   negative  . COLONOSCOPY    . EYE SURGERY  2003   R&L cataract removed & IOL- 2003 & 2007,Dr.Jenkins  . INGUINAL HERNIA REPAIR  2002   right, Dr. Truitt Leep  . JOINT REPLACEMENT  2012   left knee replacement  . LIPOMA EXCISION     back  . MENISCECTOMY Bilateral    Dr. Noemi Chapel  . PROSTATE SURGERY  07/2006   for urinary urgency, Dr. Risa Grill  . ROTATOR CUFF REPAIR Right October, 2016  . sinus & throat surgery-1995  1995  . TONSILLECTOMY    . TOTAL KNEE ARTHROPLASTY  01/11/2011   Procedure: TOTAL KNEE ARTHROPLASTY;  Surgeon: Rudean Haskell, MD;  Location: [facility];  Service: Orthopedics;  Laterality: Left;  . TOTAL KNEE ARTHROPLASTY Right 11/22/2016   Procedure: TOTAL KNEE ARTHROPLASTY;  Surgeon: Vickey Huger, MD;  Location: [facility];  Service: Orthopedics;  Laterality: Right;  . UVULECTOMY  [id number]   Dr. Erik Obey  . VASECTOMY    . WRIST SURGERY  1959   fracture- right     Family History  Problem Relation Age of Onset  . Sudden death Father        MVA  . Coronary artery disease Mother   . Emphysema Mother   . Diverticulosis Mother   . Thyroid disease  Mother   . Asthma Brother   . Bone cancer Brother   . Diabetes Paternal Grandmother   . Sarcoidosis Son   . Thyroid disease Daughter   . Anesthesia problems Neg Hx   . Hypotension Neg Hx   . Malignant hyperthermia Neg Hx   . Pseudochol deficiency Neg Hx   . Colon cancer Neg Hx      Social History   Substance and Sexual Activity  Drug Use No  ,  Social History   Substance and Sexual Activity  Alcohol Use Yes  . Alcohol/week: 4.0 standard drinks  . Types:  4 Shots of liquor per week   Comment: social, few drinks a week  ,  Social History   Tobacco Use  Smoking Status Former Smoker  . Packs/day: 1.00  . Years: 18.00  . Pack years: 18.00  . Quit date: 03/01/1966  . Years since quitting: 53.3  Smokeless Tobacco Never Used  ,    Current Outpatient Medications on File Prior to Visit  Medication Sig Dispense Refill  . acetaminophen (TYLENOL) 500 MG tablet Take 1,000 mg by mouth every 6 (six) hours as needed for mild pain.     Marland Kitchen aspirin 81 MG tablet Take 81 mg by mouth every evening.     Marland Kitchen glucose blood (PRECISION XTRA TEST STRIPS) test strip USE TO CHECK BLOOD SUGAR ONCE DAILY DX E11.9 100 each 2  . Hypromellose (ARTIFICIAL TEARS OP) Place 1 drop into both eyes 2 (two) times daily.    Marland Kitchen LANCETS ULTRA THIN 30G MISC Patient is to use to check blood glucose in the AM fasting and 2 hours after largest meal. 100 each 6  . losartan (COZAAR) 50 MG tablet Take 1 tablet (50 mg total) by mouth daily. 90 tablet 1  . Multiple Vitamin (MULTIVITAMIN WITH MINERALS) TABS Take 1 tablet by mouth daily.    . NONFORMULARY OR COMPOUNDED ITEM Glucometer (Precision XTRA- Model #[id number]) 1 each 0  . Omega-3 Fatty Acids (FISH OIL) 1000 MG CAPS Take 1,000 mg by mouth every evening.    . Probiotic Product (RESTORA) CAPS One tab qd (Patient taking differently: Take 1 capsule by mouth every evening. ) 90 capsule 3  . rosuvastatin (CRESTOR) 5 MG tablet Take 1 tablet (5 mg total) by mouth at bedtime.  90 tablet 0  . SYNTHROID 150 MCG tablet TAKE 1 TABLET DAILY BEFORE BREAKFAST 90 tablet 4  . Tamsulosin HCl (FLOMAX) 0.4 MG CAPS Take 0.4 mg by mouth 2 (two) times daily.     . vitamin B-12 (CYANOCOBALAMIN) 1000 MCG tablet Take 1,000 mcg by mouth daily.    . Calcium Carb-Cholecalciferol (CALCIUM 600 + D PO) Take 1 tablet by mouth daily.     Marland Kitchen linaclotide (LINZESS) 72 MCG capsule Take 1 capsule (72 mcg total) by mouth daily before breakfast. (Patient not taking: Reported on 06/06/2019) 30 capsule 5   No current facility-administered medications on file prior to visit.     Allergies  Allergen Reactions  . Simvastatin Other (See Comments)    MYALGIAS  . Oxycodone Other (See Comments)    Severe constipation     Review of Systems:   General:  Denies fever, chills Optho/Auditory:   Denies visual changes, blurred vision Respiratory:   Denies SOB, cough, wheeze, DIB  Cardiovascular:   Denies chest pain, palpitations, painful respirations Gastrointestinal:   Denies nausea, vomiting, diarrhea.  Endocrine:     Denies new hot or cold intolerance Musculoskeletal:  Denies joint swelling, gait issues, or new unexplained myalgias/ arthralgias Skin:  Denies rash, suspicious lesions  Neurological:    Denies dizziness, unexplained weakness, numbness  Psychiatric/Behavioral:   Denies mood changes    Objective:     Blood pressure 128/69, pulse (!) 55, temperature 97.8 F (36.6 C), temperature source Oral, resp. rate 12, height 5\' 10"  (1.778 m), weight 197 lb 6.4 oz (89.5 kg), SpO2 99 %.  Body mass index is 28.32 kg/m.  General: Well Developed, well nourished, and in no acute distress.  HEENT: Normocephalic, atraumatic, pupils equal round reactive to light, neck supple, No carotid bruits, no  JVD. Skin: Warm and dry, cap RF less 2 sec Cardiac: Regular rate and rhythm, S1, S2 WNL's, no murmurs rubs or gallops Respiratory: ECTA B/L, Not using accessory muscles, speaking in full  sentences. NeuroM-Sk: Ambulates w/o assistance, moves ext * 4 w/o difficulty, sensation grossly intact.  Ext: scant edema b/l lower ext Psych: No HI/SI, judgement and insight good, Euthymic mood. Full Affect.

## 2019-06-06 NOTE — Patient Instructions (Addendum)
Use 1/2 hydrogen peroxide and 1/2 rubbing alcohol drops into ears to help with ear wax impaction.     Earwax Buildup, Adult The ears produce a substance called earwax that helps keep bacteria out of the ear and protects the skin in the ear canal. Occasionally, earwax can build up in the ear and cause discomfort or hearing loss. What increases the risk? This condition is more likely to develop in people who:  Are male.  Are elderly.  Naturally produce more earwax.  Clean their ears often with cotton swabs.  Use earplugs often.  Use in-ear headphones often.  Wear hearing aids.  Have narrow ear canals.  Have earwax that is overly thick or sticky.  Have eczema.  Are dehydrated.  Have excess hair in the ear canal. What are the signs or symptoms? Symptoms of this condition include:  Reduced or muffled hearing.  A feeling of fullness in the ear or feeling that the ear is plugged.  Fluid coming from the ear.  Ear pain.  Ear itch.  Ringing in the ear.  Coughing.  An obvious piece of earwax that can be seen inside the ear canal. How is this diagnosed? This condition may be diagnosed based on:  Your symptoms.  Your medical history.  An ear exam. During the exam, your health care provider will look into your ear with an instrument called an otoscope. You may have tests, including a hearing test. How is this treated? This condition may be treated by:  Using ear drops to soften the earwax.  Having the earwax removed by a health care provider. The health care provider may: ? Flush the ear with water. ? Use an instrument that has a loop on the end (curette). ? Use a suction device.  Surgery to remove the wax buildup. This may be done in severe cases. Follow these instructions at home:   Take over-the-counter and prescription medicines only as told by your health care provider.  Do not put any objects, including cotton swabs, into your ear. You can clean the  opening of your ear canal with a washcloth or facial tissue.  Follow instructions from your health care provider about cleaning your ears. Do not over-clean your ears.  Drink enough fluid to keep your urine clear or pale yellow. This will help to thin the earwax.  Keep all follow-up visits as told by your health care provider. If earwax builds up in your ears often or if you use hearing aids, consider seeing your health care provider for routine, preventive ear cleanings. Ask your health care provider how often you should schedule your cleanings.  If you have hearing aids, clean them according to instructions from the manufacturer and your health care provider. Contact a health care provider if:  You have ear pain.  You develop a fever.  You have blood, pus, or other fluid coming from your ear.  You have hearing loss.  You have ringing in your ears that does not go away.  Your symptoms do not improve with treatment.  You feel like the room is spinning (vertigo). Summary  Earwax can build up in the ear and cause discomfort or hearing loss.  The most common symptoms of this condition include reduced or muffled hearing and a feeling of fullness in the ear or feeling that the ear is plugged.  This condition may be diagnosed based on your symptoms, your medical history, and an ear exam.  This condition may be treated by using ear  drops to soften the earwax or by having the earwax removed by a health care provider.  Do not put any objects, including cotton swabs, into your ear. You can clean the opening of your ear canal with a washcloth or facial tissue. This information is not intended to replace advice given to you by your health care provider. Make sure you discuss any questions you have with your health care provider. Document Revised: 01/28/2017 Document Reviewed: 04/28/2016 Elsevier Patient Education  2020 [employer].

## 2019-06-08 DIAGNOSIS — M25562 Pain in left knee: Secondary | ICD-10-CM | POA: Diagnosis not present

## 2019-06-08 DIAGNOSIS — R2689 Other abnormalities of gait and mobility: Secondary | ICD-10-CM | POA: Diagnosis not present

## 2019-06-11 ENCOUNTER — Ambulatory Visit: Payer: Medicare Other | Attending: Internal Medicine

## 2019-06-11 DIAGNOSIS — Z20822 Contact with and (suspected) exposure to covid-19: Secondary | ICD-10-CM | POA: Diagnosis not present

## 2019-06-13 DIAGNOSIS — M25562 Pain in left knee: Secondary | ICD-10-CM | POA: Diagnosis not present

## 2019-06-13 DIAGNOSIS — R2689 Other abnormalities of gait and mobility: Secondary | ICD-10-CM | POA: Diagnosis not present

## 2019-06-13 LAB — SARS-COV-2, NAA 2 DAY TAT

## 2019-06-13 LAB — NOVEL CORONAVIRUS, NAA: SARS-CoV-2, NAA: NOT DETECTED

## 2019-06-22 DIAGNOSIS — M25562 Pain in left knee: Secondary | ICD-10-CM | POA: Diagnosis not present

## 2019-06-22 DIAGNOSIS — R2689 Other abnormalities of gait and mobility: Secondary | ICD-10-CM | POA: Diagnosis not present

## 2019-06-26 ENCOUNTER — Ambulatory Visit (INDEPENDENT_AMBULATORY_CARE_PROVIDER_SITE_OTHER): Payer: Medicare Other | Admitting: Gastroenterology

## 2019-06-26 ENCOUNTER — Encounter: Payer: Self-pay | Admitting: Gastroenterology

## 2019-06-26 VITALS — BP 124/60 | HR 56 | Temp 98.0°F | Ht 70.0 in | Wt 196.0 lb

## 2019-06-26 DIAGNOSIS — K5900 Constipation, unspecified: Secondary | ICD-10-CM | POA: Diagnosis not present

## 2019-06-26 NOTE — Progress Notes (Signed)
Review of pertinent gastrointestinal problems: 1.  Routine risk for colon cancer.  Colonoscopy Dr. Ardis Hughs March 2014 showed diverticulosis. 2.  Constipation and borborygmi.  Evaluated 2020 and GI.  MiraLAX was helping however he was still bothered by a lot of gas.   HPI: This is a very pleasant 86 year old man whom I last saw about 3 months ago  The time I last saw him about 3 months ago we discussed his pellet-like stools and intermittent fecal incontinence.  I recommended a trial of fiber supplement powder, on a daily basis.  He called to tell me that it was about 60% effective.  At that time I recommended a trial of Linzess low-dose once daily and that he continue taking the fiber supplement daily.  Linzess caused significant loose stools.  He has been taking Citrucel 1 heaping scoop once daily on a daily basis since then.  He notices if he takes it 3 or 4 days in a row that he can actually get a little bit constipated so he stops it for a day and then resumes it.  He is having no overt bleeding.  He is still bothered by a lot of gassiness.  He takes a Gas-X pill at bedtime every night   ROS: complete GI ROS as described in HPI, all other review negative.  Constitutional:  No unintentional weight loss   Past Medical History:  Diagnosis Date  . Arthritis    both hands;Dr.Deveshwar  . Constipation due to pain medication   . Diabetes mellitus without complication ([place])    Type II. Uses no medication. Pt controls with diet and weight  . Enlarged prostate   . Frequent urination at night   . GERD (gastroesophageal reflux disease)   . History of noncompliance with medical treatment, presenting hazards to health 11/20/2018  . Hyperlipidemia   . Hyperlipidemia associated with type 2 diabetes mellitus ([place]) 04/19/2007   Qualifier: Diagnosis of  By: Linna Darner MD, Gwyndolyn Saxon    . Hypertension   . Hypothyroidism   . Polio    as a child w/o complications  . Snoring    no sleep apnea  . Statin  declined-  11/20/2018   patient understands risks associated with this decision and has been advised to restart by cardiologist as well as myself several times  . Torn meniscus   . Transfusion history 2012   post TKR    Past Surgical History:  Procedure Laterality Date  . ANTERIOR LAT LUMBAR FUSION Right 11/03/2017   Procedure: Right Lumbar three-four Anterolateral lumbar interbody fusion with lateral plate;  Surgeon: Erline Levine, MD;  Location: [facility];  Service: Neurosurgery;  Laterality: Right;  . BACK SURGERY  2011   hemiarthroplasty 01/11/1999 and 6  . CARDIAC CATHETERIZATION  09/2001   negative  . COLONOSCOPY    . EYE SURGERY  2003   R&L cataract removed & IOL- 2003 & 2007,Dr.Jenkins  . INGUINAL HERNIA REPAIR  2002   right, Dr. Truitt Leep  . JOINT REPLACEMENT  2012   left knee replacement  . LIPOMA EXCISION     back  . MENISCECTOMY Bilateral    Dr. Noemi Chapel  . PROSTATE SURGERY  07/2006   for urinary urgency, Dr. Risa Grill  . ROTATOR CUFF REPAIR Right October, 2016  . sinus & throat surgery-1995  1995  . TONSILLECTOMY    . TOTAL KNEE ARTHROPLASTY  01/11/2011   Procedure: TOTAL KNEE ARTHROPLASTY;  Surgeon: Rudean Haskell, MD;  Location: [facility];  Service: Orthopedics;  Laterality: Left;  . TOTAL KNEE ARTHROPLASTY Right 11/22/2016   Procedure: TOTAL KNEE ARTHROPLASTY;  Surgeon: Vickey Huger, MD;  Location: [facility];  Service: Orthopedics;  Laterality: Right;  . UVULECTOMY  [id number]   Dr. Erik Obey  . VASECTOMY    . WRIST SURGERY  1959   fracture- right    Current Outpatient Medications  Medication Sig Dispense Refill  . acetaminophen (TYLENOL) 500 MG tablet Take 1,000 mg by mouth every 6 (six) hours as needed for mild pain.     Marland Kitchen aspirin 81 MG tablet Take 81 mg by mouth every evening.     . Calcium Carb-Cholecalciferol (CALCIUM 600 + D PO) Take 1 tablet by mouth daily.     Marland Kitchen glucose blood (PRECISION XTRA TEST STRIPS) test strip USE TO CHECK BLOOD SUGAR ONCE DAILY DX E11.9 100 each 2  .  Hypromellose (ARTIFICIAL TEARS OP) Place 1 drop into both eyes 2 (two) times daily.    Marland Kitchen LANCETS ULTRA THIN 30G MISC Patient is to use to check blood glucose in the AM fasting and 2 hours after largest meal. 100 each 6  . losartan (COZAAR) 50 MG tablet Take 1 tablet (50 mg total) by mouth daily. 90 tablet 1  . Methylcellulose, Laxative, (CITRUCEL PO) Take by mouth at bedtime.    . Multiple Vitamin (MULTIVITAMIN WITH MINERALS) TABS Take 1 tablet by mouth daily.    . NONFORMULARY OR COMPOUNDED ITEM Glucometer (Precision XTRA- Model #[id number]) 1 each 0  . Omega-3 Fatty Acids (FISH OIL) 1000 MG CAPS Take 1,000 mg by mouth every evening.    . Probiotic Product (RESTORA) CAPS One tab qd (Patient taking differently: Take 1 capsule by mouth every evening. ) 90 capsule 3  . rosuvastatin (CRESTOR) 5 MG tablet Take 1 tablet (5 mg total) by mouth at bedtime. 90 tablet 0  . SYNTHROID 150 MCG tablet TAKE 1 TABLET DAILY BEFORE BREAKFAST 90 tablet 4  . Tamsulosin HCl (FLOMAX) 0.4 MG CAPS Take 0.4 mg by mouth 2 (two) times daily.     . vitamin B-12 (CYANOCOBALAMIN) 1000 MCG tablet Take 1,000 mcg by mouth daily.     No current facility-administered medications for this visit.    Allergies as of 06/26/2019 - Review Complete 06/26/2019  Allergen Reaction Noted  . Simvastatin Other (See Comments) 04/18/2007  . Oxycodone Other (See Comments) 10/11/2013    Family History  Problem Relation Age of Onset  . Sudden death Father        MVA  . Coronary artery disease Mother   . Emphysema Mother   . Diverticulosis Mother   . Thyroid disease Mother   . Asthma Brother   . Bone cancer Brother   . Diabetes Paternal Grandmother   . Sarcoidosis Son   . Thyroid disease Daughter   . Anesthesia problems Neg Hx   . Hypotension Neg Hx   . Malignant hyperthermia Neg Hx   . Pseudochol deficiency Neg Hx   . Colon cancer Neg Hx     Social History   Socioeconomic History  . Marital status: Married    Spouse  name: Not on file  . Number of children: Not on file  . Years of education: Not on file  . Highest education level: Not on file  Occupational History  . Not on file  Tobacco Use  . Smoking status: Former Smoker    Packs/day: 1.00    Years: 18.00    Pack years: 18.00    Quit date:  03/01/1966    Years since quitting: 53.3  . Smokeless tobacco: Never Used  Substance and Sexual Activity  . Alcohol use: Yes    Alcohol/week: 4.0 standard drinks    Types: 4 Shots of liquor per week    Comment: social, few drinks a week  . Drug use: No  . Sexual activity: Yes  Other Topics Concern  . Not on file  Social History Narrative  . Not on file   Social Determinants of Health   Financial Resource Strain:   . Difficulty of Paying Living Expenses:   Food Insecurity:   . Worried About Charity fundraiser in the Last Year:   . Arboriculturist in the Last Year:   Transportation Needs:   . Film/video editor (Medical):   Marland Kitchen Lack of Transportation (Non-Medical):   Physical Activity:   . Days of Exercise per Week:   . Minutes of Exercise per Session:   Stress:   . Feeling of Stress :   Social Connections:   . Frequency of Communication with Friends and Family:   . Frequency of Social Gatherings with Friends and Family:   . Attends Religious Services:   . Active Member of Clubs or Organizations:   . Attends Archivist Meetings:   Marland Kitchen Marital Status:   Intimate Partner Violence:   . Fear of Current or Ex-Partner:   . Emotionally Abused:   Marland Kitchen Physically Abused:   . Sexually Abused:      Physical Exam: BP 124/60   Pulse (!) 56   Temp 98 F (36.7 C)   Ht 5\' 10"  (1.778 m)   Wt 196 lb (88.9 kg)   BMI 28.12 kg/m  Constitutional: generally well-appearing Psychiatric: alert and oriented x3 Abdomen: soft, nontender, nondistended, no obvious ascites, no peritoneal signs, normal bowel sounds No peripheral edema noted in lower extremities  Assessment and plan: 86 y.o. male  with chronic constipation  Overall he is very happy with the Citrucel powder fiber supplement that he has been taking.  He has a little bit of adjusting to do with the dose and I recommended he try taking it every other day instead of every single day for a while.  He is going to increase his Gas-X to 2 pills daily instead of 1 pill at bedtime every night as he is still bothered by a bit of gassiness in the morning and sometimes during the day.  He will call to report on his response and he knows that he can adjust his fiber supplement up or down as he sees fit.  Please see the "Patient Instructions" section for addition details about the plan.  Owens Loffler, MD [facility] 06/26/2019, 10:02 AM   Total time on date of encounter was 15 minutes (this included time spent preparing to see the patient reviewing records; obtaining and/or reviewing separately obtained history; performing a medically appropriate exam and/or evaluation; counseling and educating the patient and family if present; ordering medications, tests or procedures if applicable; and documenting clinical information in the health record).

## 2019-06-26 NOTE — Patient Instructions (Signed)
If you are age 84 or older, your body mass index should be between 23-30. Your Body mass index is 28.12 kg/m. If this is out of the aforementioned range listed, please consider follow up with your Primary Care Provider.  If you are age 52 or younger, your body mass index should be between 19-25. Your Body mass index is 28.12 kg/m. If this is out of the aformentioned range listed, please consider follow up with your Primary Care Provider.   You will adjust your Citrucel dose to every other day.  You should take Gas-X two times daily.  Please call our office in 5 -6 weeks to let us know how you are doing.  Thank you for entrusting me with your care and choosing [facility].  Dr Ardis Hughs

## 2019-06-27 DIAGNOSIS — R2689 Other abnormalities of gait and mobility: Secondary | ICD-10-CM | POA: Diagnosis not present

## 2019-06-27 DIAGNOSIS — M25562 Pain in left knee: Secondary | ICD-10-CM | POA: Diagnosis not present

## 2019-07-03 DIAGNOSIS — R2689 Other abnormalities of gait and mobility: Secondary | ICD-10-CM | POA: Diagnosis not present

## 2019-07-03 DIAGNOSIS — M25562 Pain in left knee: Secondary | ICD-10-CM | POA: Diagnosis not present

## 2019-07-05 DIAGNOSIS — Z96651 Presence of right artificial knee joint: Secondary | ICD-10-CM | POA: Diagnosis not present

## 2019-07-05 DIAGNOSIS — R2689 Other abnormalities of gait and mobility: Secondary | ICD-10-CM | POA: Diagnosis not present

## 2019-07-05 DIAGNOSIS — M25562 Pain in left knee: Secondary | ICD-10-CM | POA: Diagnosis not present

## 2019-07-05 DIAGNOSIS — Z96652 Presence of left artificial knee joint: Secondary | ICD-10-CM | POA: Diagnosis not present

## 2019-07-17 DIAGNOSIS — R2689 Other abnormalities of gait and mobility: Secondary | ICD-10-CM | POA: Diagnosis not present

## 2019-07-17 DIAGNOSIS — M25562 Pain in left knee: Secondary | ICD-10-CM | POA: Diagnosis not present

## 2019-07-19 DIAGNOSIS — M25562 Pain in left knee: Secondary | ICD-10-CM | POA: Diagnosis not present

## 2019-07-19 DIAGNOSIS — R2689 Other abnormalities of gait and mobility: Secondary | ICD-10-CM | POA: Diagnosis not present

## 2019-07-23 NOTE — Progress Notes (Signed)
Patient ID: Kyle Simon, male   DOB: 03-Nov-1932, 86 y.o.   MRN: [id number]            Reason for Appointment:  follow-up visit    History of Present Illness:   HYPONATREMIA:   Serum sodium has been low since at least 2011 and has been as low as 125 in the past  Also recently as low as 127 He has not had any symptoms of decreased appetite, nausea or drowsiness Not on any thiazide diuretics or SSRI drugs Has had negative endocrine work-up for thyroid and cortisol deficiencies  Has a history of drinking a lot of water partly for his constipation His urine osmolality was high at 442 on initial evaluation  He was told to cut back on his fluid intake; is drinking 1 glass less in the morning of water and overall less fluids No recent problems with nausea, weight loss or decreased appetite  His sodium appeared to improve with adding a salt tablet in the morning but he cannot tolerate more than 1 a day   Repeat labs pending  Lab Results  Component Value Date   NA 130 (L) 03/21/2019   K 4.9 03/21/2019   CL 93 (L) 03/21/2019   CO2 24 03/21/2019     Hypothyroidism was first diagnosed  10-15 years ago   At the time of diagnosis patient was apparently not having symptoms of  fatigue, cold sensitivity, difficulty concentrating, dry skin, weight gain  He thinks his primary care physician may have done routine testing and told him that he was hypothyroid and started him on unknown dose of levothyroxine He did not start feeling any different when he started taking levothyroxine initially Also usually does not feel any different with dosage adjustments that have been made Apparently his dose was 150 g for some time but this was reduced in 06/2015 when his TSH was low normal Since then he has been taking 150 g 5 days a week and 125 g twice a week Non-insulin initial consultation he was complaining that since spring time his energy level has been not as good and he has not been able  to do as much with activities as he used to   He was referred here because his TSH is tending to be high but also his free T4 was relatively higher in 7/18  Recent history: On his initial consultation since his TSH was still relatively high and he was taking the equivalent of about 142 g of levothyroxine daily he was switched to taking 150 g once daily Also he was told to not take his levothyroxine at bedtime but take it before breakfast   Subsequently his TSH had been back to normal consistently He is taking his levothyroxine now regularly every morning However it appears that he is taking his multivitamin at the same time which contains some calcium  Recently feels fairly good with no unusual fatigue or lethargy.  No unusual cold intolerance  TSH the last 2 measurements has been around 3 Free T4 was high in 9/20, he does take a multivitamin with unknown biotin content   Patient's weight history is as follows:  Wt Readings from Last 3 Encounters:  07/24/19 195 lb (88.5 kg)  06/26/19 196 lb (88.9 kg)  06/06/19 197 lb 6.4 oz (89.5 kg)    Thyroid function results have been as follows:  Lab Results  Component Value Date   TSH 3.350 02/16/2019   TSH 3.09 12/11/2018   TSH  4.910 (H) 11/15/2018   TSH 6.950 (H) 08/22/2017   FREET4 1.66 02/16/2019   FREET4 1.81 (H) 11/15/2018   FREET4 1.45 08/22/2017   FREET4 1.02 04/26/2017   T3FREE 3.2 12/28/2016   T3FREE 2.7 10/17/2015    Lab Results  Component Value Date   TSH 3.350 02/16/2019   TSH 3.09 12/11/2018   TSH 4.910 (H) 11/15/2018   TSH 6.950 (H) 08/22/2017   TSH 2.87 04/26/2017   TSH 3.85 12/28/2016   TSH 5.860 (H) 09/15/2016   TSH 5.070 (H) 06/22/2016   TSH 2.19 02/04/2016     Past Medical History:  Diagnosis Date  . Arthritis    both hands;Dr.Deveshwar  . Constipation due to pain medication   . Diabetes mellitus without complication ([place])    Type II. Uses no medication. Pt controls with diet and weight  .  Enlarged prostate   . Frequent urination at night   . GERD (gastroesophageal reflux disease)   . History of noncompliance with medical treatment, presenting hazards to health 11/20/2018  . Hyperlipidemia   . Hyperlipidemia associated with type 2 diabetes mellitus ([place]) 04/19/2007   Qualifier: Diagnosis of  By: Linna Darner MD, Gwyndolyn Saxon    . Hypertension   . Hypothyroidism   . Polio    as a child w/o complications  . Snoring    no sleep apnea  . Statin declined-  11/20/2018   patient understands risks associated with this decision and has been advised to restart by cardiologist as well as myself several times  . Torn meniscus   . Transfusion history 2012   post TKR    Past Surgical History:  Procedure Laterality Date  . ANTERIOR LAT LUMBAR FUSION Right 11/03/2017   Procedure: Right Lumbar three-four Anterolateral lumbar interbody fusion with lateral plate;  Surgeon: Erline Levine, MD;  Location: [facility];  Service: Neurosurgery;  Laterality: Right;  . BACK SURGERY  2011   hemiarthroplasty 01/11/1999 and 6  . CARDIAC CATHETERIZATION  09/2001   negative  . COLONOSCOPY    . EYE SURGERY  2003   R&L cataract removed & IOL- 2003 & 2007,Dr.Jenkins  . INGUINAL HERNIA REPAIR  2002   right, Dr. Truitt Leep  . JOINT REPLACEMENT  2012   left knee replacement  . LIPOMA EXCISION     back  . MENISCECTOMY Bilateral    Dr. Noemi Chapel  . PROSTATE SURGERY  07/2006   for urinary urgency, Dr. Risa Grill  . ROTATOR CUFF REPAIR Right October, 2016  . sinus & throat surgery-1995  1995  . TONSILLECTOMY    . TOTAL KNEE ARTHROPLASTY  01/11/2011   Procedure: TOTAL KNEE ARTHROPLASTY;  Surgeon: Rudean Haskell, MD;  Location: [facility];  Service: Orthopedics;  Laterality: Left;  . TOTAL KNEE ARTHROPLASTY Right 11/22/2016   Procedure: TOTAL KNEE ARTHROPLASTY;  Surgeon: Vickey Huger, MD;  Location: [facility];  Service: Orthopedics;  Laterality: Right;  . UVULECTOMY  [id number]   Dr. Erik Obey  . VASECTOMY    . WRIST SURGERY  1959    fracture- right    Family History  Problem Relation Age of Onset  . Sudden death Father        MVA  . Coronary artery disease Mother   . Emphysema Mother   . Diverticulosis Mother   . Thyroid disease Mother   . Asthma Brother   . Bone cancer Brother   . Diabetes Paternal Grandmother   . Sarcoidosis Son   . Thyroid disease Daughter   . Anesthesia problems  Neg Hx   . Hypotension Neg Hx   . Malignant hyperthermia Neg Hx   . Pseudochol deficiency Neg Hx   . Colon cancer Neg Hx     Social History:  reports that he quit smoking about 53 years ago. He has a 18.00 pack-year smoking history. He has never used smokeless tobacco. He reports current alcohol use of about 4.0 standard drinks of alcohol per week. He reports that he does not use drugs.  Allergies:  Allergies  Allergen Reactions  . Simvastatin Other (See Comments)    MYALGIAS  . Oxycodone Other (See Comments)    Severe constipation    Allergies as of 07/24/2019      Reactions   Simvastatin Other (See Comments)   MYALGIAS   Oxycodone Other (See Comments)   Severe constipation      Medication List       Accurate as of Jul 24, 2019 10:53 AM. If you have any questions, ask your nurse or doctor.        acetaminophen 500 MG tablet Commonly known as: TYLENOL Take 1,000 mg by mouth every 6 (six) hours as needed for mild pain.   ARTIFICIAL TEARS OP Place 1 drop into both eyes 2 (two) times daily.   aspirin 81 MG tablet Take 81 mg by mouth every evening.   CALCIUM 600 + D PO Take 1 tablet by mouth daily.   CITRUCEL PO Take by mouth at bedtime.   Fish Oil 1000 MG Caps Take 1,000 mg by mouth every evening.   Flomax 0.4 MG Caps capsule Generic drug: tamsulosin Take 0.4 mg by mouth 2 (two) times daily.   glucose blood test strip Commonly known as: PRECISION XTRA TEST STRIPS USE TO CHECK BLOOD SUGAR ONCE DAILY DX E11.9   Lancets Ultra Thin 30G Misc Patient is to use to check blood glucose in the AM fasting  and 2 hours after largest meal.   losartan 50 MG tablet Commonly known as: COZAAR Take 1 tablet (50 mg total) by mouth daily.   multivitamin with minerals Tabs tablet Take 1 tablet by mouth daily.   NONFORMULARY OR COMPOUNDED ITEM Glucometer (Precision XTRA- Model #[id number])   Restora Caps One tab qd What changed:   how much to take  how to take this  when to take this  additional instructions   rosuvastatin 5 MG tablet Commonly known as: Crestor Take 1 tablet (5 mg total) by mouth at bedtime.   Synthroid 150 MCG tablet Generic drug: levothyroxine TAKE 1 TABLET DAILY BEFORE BREAKFAST   vitamin B-12 1000 MCG tablet Commonly known as: CYANOCOBALAMIN Take 1,000 mcg by mouth daily.        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 Any transcriptional errors that result from this process are unintentional.

## 2019-07-24 ENCOUNTER — Ambulatory Visit (INDEPENDENT_AMBULATORY_CARE_PROVIDER_SITE_OTHER): Payer: Medicare Other | Admitting: Endocrinology

## 2019-07-24 ENCOUNTER — Other Ambulatory Visit: Payer: Self-pay

## 2019-07-24 ENCOUNTER — Encounter: Payer: Self-pay | Admitting: Endocrinology

## 2019-07-24 VITALS — BP 130/80 | HR 49 | Ht 70.0 in | Wt 195.0 lb

## 2019-07-24 DIAGNOSIS — R7303 Prediabetes: Secondary | ICD-10-CM

## 2019-07-24 DIAGNOSIS — E063 Autoimmune thyroiditis: Secondary | ICD-10-CM | POA: Diagnosis not present

## 2019-07-24 DIAGNOSIS — E871 Hypo-osmolality and hyponatremia: Secondary | ICD-10-CM

## 2019-07-24 LAB — BASIC METABOLIC PANEL
BUN: 16 mg/dL (ref 6–23)
CO2: 26 mEq/L (ref 19–32)
Calcium: 9.3 mg/dL (ref 8.4–10.5)
Chloride: 97 mEq/L (ref 96–112)
Creatinine, Ser: 0.88 mg/dL (ref 0.40–1.50)
GFR: 82.42 mL/min (ref >60.00)
Glucose, Bld: 124 mg/dL — ABNORMAL HIGH (ref 70–99)
Potassium: 4.6 mEq/L (ref 3.5–5.1)
Sodium: 131 mEq/L — ABNORMAL LOW (ref 135–145)

## 2019-07-24 LAB — TSH: TSH: 3.78 u[IU]/mL (ref 0.35–4.50)

## 2019-07-24 LAB — T4, FREE: Free T4: 1.27 ng/dL (ref 0.60–1.60)

## 2019-07-24 NOTE — Progress Notes (Signed)
Please call to let patient know that the sodium results are slightly better and thyroid is okay; no further action needed

## 2019-07-25 DIAGNOSIS — M25562 Pain in left knee: Secondary | ICD-10-CM | POA: Diagnosis not present

## 2019-07-25 DIAGNOSIS — R2689 Other abnormalities of gait and mobility: Secondary | ICD-10-CM | POA: Diagnosis not present

## 2019-07-25 LAB — OSMOLALITY, URINE: Osmolality, Ur: 443 mOsmol/kg

## 2019-07-25 LAB — SODIUM, URINE, RANDOM: Sodium, Ur: 94 mmol/L

## 2019-07-26 DIAGNOSIS — R2689 Other abnormalities of gait and mobility: Secondary | ICD-10-CM | POA: Diagnosis not present

## 2019-07-26 DIAGNOSIS — M25562 Pain in left knee: Secondary | ICD-10-CM | POA: Diagnosis not present

## 2019-08-14 ENCOUNTER — Telehealth: Payer: Self-pay | Admitting: Gastroenterology

## 2019-08-14 NOTE — Telephone Encounter (Signed)
The pt states he wants to update Dr Ardis Hughs on the "wet gas with mucous" issue he was having.  He says the  citrucel has helped a lot but he still experiences occasional episodes of "wet gas"  FYI

## 2019-08-14 NOTE — Telephone Encounter (Signed)
Patient is calling to update Dr. Ardis Hughs on how he is doing. Patient state that he is still having wet discharge, states having white film that he does not know what it is. States he feels like he is getting a little better but feels he has a long ways to go.

## 2019-08-15 ENCOUNTER — Other Ambulatory Visit: Payer: Self-pay | Admitting: Physician Assistant

## 2019-08-15 DIAGNOSIS — E1169 Type 2 diabetes mellitus with other specified complication: Secondary | ICD-10-CM

## 2019-08-15 DIAGNOSIS — E785 Hyperlipidemia, unspecified: Secondary | ICD-10-CM

## 2019-08-15 MED ORDER — ROSUVASTATIN CALCIUM 5 MG PO TABS: 5.0000 mg | ORAL_TABLET | Freq: Every day | ORAL | 0 refills | Status: DC

## 2019-08-15 NOTE — Telephone Encounter (Signed)
The pt has been advised and has f/u scheduled for 8/25 with Dr Ardis Hughs.

## 2019-08-15 NOTE — Telephone Encounter (Signed)
No changes in his regimen.  It may take a while for full effect.  He needs my next avialable OV to see how he is faring.  Thanks

## 2019-08-16 ENCOUNTER — Other Ambulatory Visit: Payer: Self-pay | Admitting: Family Medicine

## 2019-08-16 ENCOUNTER — Other Ambulatory Visit: Payer: Self-pay

## 2019-08-16 ENCOUNTER — Encounter (INDEPENDENT_AMBULATORY_CARE_PROVIDER_SITE_OTHER): Payer: Medicare Other | Admitting: Ophthalmology

## 2019-08-16 DIAGNOSIS — I10 Essential (primary) hypertension: Secondary | ICD-10-CM

## 2019-08-16 DIAGNOSIS — E113391 Type 2 diabetes mellitus with moderate nonproliferative diabetic retinopathy without macular edema, right eye: Secondary | ICD-10-CM

## 2019-08-16 DIAGNOSIS — H43813 Vitreous degeneration, bilateral: Secondary | ICD-10-CM | POA: Diagnosis not present

## 2019-08-16 DIAGNOSIS — E113312 Type 2 diabetes mellitus with moderate nonproliferative diabetic retinopathy with macular edema, left eye: Secondary | ICD-10-CM | POA: Diagnosis not present

## 2019-08-16 DIAGNOSIS — E11311 Type 2 diabetes mellitus with unspecified diabetic retinopathy with macular edema: Secondary | ICD-10-CM

## 2019-08-16 DIAGNOSIS — E785 Hyperlipidemia, unspecified: Secondary | ICD-10-CM

## 2019-08-16 DIAGNOSIS — E1169 Type 2 diabetes mellitus with other specified complication: Secondary | ICD-10-CM

## 2019-08-16 DIAGNOSIS — H35033 Hypertensive retinopathy, bilateral: Secondary | ICD-10-CM

## 2019-09-12 ENCOUNTER — Other Ambulatory Visit: Payer: Self-pay

## 2019-09-12 ENCOUNTER — Ambulatory Visit (INDEPENDENT_AMBULATORY_CARE_PROVIDER_SITE_OTHER): Payer: Medicare Other | Admitting: Endocrinology

## 2019-09-12 ENCOUNTER — Ambulatory Visit: Payer: Medicare Other | Admitting: Endocrinology

## 2019-09-12 ENCOUNTER — Encounter: Payer: Self-pay | Admitting: Endocrinology

## 2019-09-12 VITALS — BP 130/66 | HR 49 | Ht 70.0 in | Wt 196.6 lb

## 2019-09-12 DIAGNOSIS — E063 Autoimmune thyroiditis: Secondary | ICD-10-CM | POA: Diagnosis not present

## 2019-09-12 DIAGNOSIS — E871 Hypo-osmolality and hyponatremia: Secondary | ICD-10-CM | POA: Diagnosis not present

## 2019-09-12 DIAGNOSIS — R0600 Dyspnea, unspecified: Secondary | ICD-10-CM

## 2019-09-12 LAB — CBC WITH DIFFERENTIAL/PLATELET
Basophils Absolute: 0 10*3/uL (ref 0.0–0.1)
Basophils Relative: 0.4 % (ref 0.0–3.0)
Eosinophils Absolute: 0.1 10*3/uL (ref 0.0–0.7)
Eosinophils Relative: 2 % (ref 0.0–5.0)
HCT: 36.8 % — ABNORMAL LOW (ref 39.0–52.0)
Hemoglobin: 12.5 g/dL — ABNORMAL LOW (ref 13.0–17.0)
Lymphocytes Relative: 26.6 % (ref 12.0–46.0)
Lymphs Abs: 1.7 10*3/uL (ref 0.7–4.0)
MCHC: 34.1 g/dL (ref 30.0–36.0)
MCV: 93.1 fl (ref 78.0–100.0)
Monocytes Absolute: 0.7 10*3/uL (ref 0.1–1.0)
Monocytes Relative: 10.7 % (ref 3.0–12.0)
Neutro Abs: 3.8 10*3/uL (ref 1.4–7.7)
Neutrophils Relative %: 60.3 % (ref 43.0–77.0)
Platelets: 240 10*3/uL (ref 150.0–400.0)
RBC: 3.95 Mil/uL — ABNORMAL LOW (ref 4.22–5.81)
RDW: 12.3 % (ref 11.5–15.5)
WBC: 6.3 10*3/uL (ref 4.0–10.5)

## 2019-09-12 LAB — BASIC METABOLIC PANEL
BUN: 15 mg/dL (ref 6–23)
CO2: 27 mEq/L (ref 19–32)
Calcium: 9.3 mg/dL (ref 8.4–10.5)
Chloride: 96 mEq/L (ref 96–112)
Creatinine, Ser: 0.9 mg/dL (ref 0.40–1.50)
GFR: 80.29 mL/min (ref >60.00)
Glucose, Bld: 118 mg/dL — ABNORMAL HIGH (ref 70–99)
Potassium: 4.4 mEq/L (ref 3.5–5.1)
Sodium: 129 mEq/L — ABNORMAL LOW (ref 135–145)

## 2019-09-12 LAB — TSH: TSH: 6.58 u[IU]/mL — ABNORMAL HIGH (ref 0.35–4.50)

## 2019-09-12 LAB — T4, FREE: Free T4: 1.12 ng/dL (ref 0.60–1.60)

## 2019-09-12 NOTE — Progress Notes (Addendum)
Patient ID: Kyle Simon, male   DOB: Mar 09, 1932, 86 y.o.   MRN: [id number]            Reason for Appointment: Fatigue and follow-up visit    History of Present Illness:   HYPONATREMIA:  Background history:  Serum sodium has been low since at least 2011 and has been as low as 125 in the past More recently lowest reading 127 He has not had any associated symptoms of decreased appetite, nausea or drowsiness Not on any thiazide diuretics or SSRI drugs Has had negative endocrine work-up for cortisol deficiencies  Has a history of drinking a lot of water partly for his constipation  Recent history: His urine osmolality is still high at 443, was also high at 442 on initial evaluation  He was told to cut back on his fluid intake and he thinks he is drinking overall less water He was also started on salt tablets and he takes 1 daily, he thinks his stomach is upset with 2 tablets daily  No recent problems with nausea or decreased appetite  Last sodium was relatively stable at 131  He is following up today because of symptoms of fatigue and drowsiness as discussed below  Lab Results  Component Value Date   NA 131 (L) 07/24/2019   K 4.6 07/24/2019   CL 97 07/24/2019   CO2 26 07/24/2019     Hypothyroidism was first diagnosed  10-15 years ago   At the time of diagnosis patient was not having symptoms of  fatigue, cold sensitivity, difficulty concentrating, dry skin, weight gain Likely had only done routine testing and was told that he was hypothyroid and started him on unknown dose of levothyroxine  He did not start feeling any different when he started taking levothyroxine initially Also usually does not feel any different with dosage adjustments that have been made Apparently his dose was 150 g for some time but this was reduced in 06/2015 when his TSH was low normal Since then he has been taking 150 g 5 days a week and 125 g twice a week Non-insulin initial consultation  he was complaining that since spring time his energy level has been not as good and he has not been able to do as much with activities as he used to   He was referred here because his TSH is tending to be high but also his free T4 was relatively higher in 7/18  Recent history: On his initial consultation since his TSH was still relatively high and he was taking the equivalent of about 142 g of levothyroxine daily he was switched to taking 150 g once daily Also he was told to not take his levothyroxine at bedtime but take it before breakfast  Subsequently his TSH had been back to normal consistently He is taking his levothyroxine consistently before breakfast every morning Not taking any multivitamins in the morning also  For the last 4 to 6 weeks he has felt more tired and has not been able to do much physically.  He also falls asleep easily despite sleeping well at night.  No cold intolerance  TSH the last 2 measurements has been in the 3-3.8 range Free T4 was high in 9/20, he does take a multivitamin with unknown biotin content   Patient's weight history is as follows:  Wt Readings from Last 3 Encounters:  09/12/19 196 lb 9.6 oz (89.2 kg)  07/24/19 195 lb (88.5 kg)  06/26/19 196 lb (88.9 kg)  Thyroid function results have been as follows:  Lab Results  Component Value Date   TSH 3.78 07/24/2019   TSH 3.350 02/16/2019   TSH 3.09 12/11/2018   TSH 4.910 (H) 11/15/2018   FREET4 1.27 07/24/2019   FREET4 1.66 02/16/2019   FREET4 1.81 (H) 11/15/2018   FREET4 1.45 08/22/2017   T3FREE 3.2 12/28/2016   T3FREE 2.7 10/17/2015    Lab Results  Component Value Date   TSH 3.78 07/24/2019   TSH 3.350 02/16/2019   TSH 3.09 12/11/2018   TSH 4.910 (H) 11/15/2018   TSH 6.950 (H) 08/22/2017   TSH 2.87 04/26/2017   TSH 3.85 12/28/2016   TSH 5.860 (H) 09/15/2016   TSH 5.070 (H) 06/22/2016     Past Medical History:  Diagnosis Date  . Arthritis    both hands;Dr.Deveshwar  .  Constipation due to pain medication   . Diabetes mellitus without complication ([place])    Type II. Uses no medication. Pt controls with diet and weight  . Enlarged prostate   . Frequent urination at night   . GERD (gastroesophageal reflux disease)   . History of noncompliance with medical treatment, presenting hazards to health 11/20/2018  . Hyperlipidemia   . Hyperlipidemia associated with type 2 diabetes mellitus ([place]) 04/19/2007   Qualifier: Diagnosis of  By: Linna Darner MD, Gwyndolyn Saxon    . Hypertension   . Hypothyroidism   . Polio    as a child w/o complications  . Snoring    no sleep apnea  . Statin declined-  11/20/2018   patient understands risks associated with this decision and has been advised to restart by cardiologist as well as myself several times  . Torn meniscus   . Transfusion history 2012   post TKR    Past Surgical History:  Procedure Laterality Date  . ANTERIOR LAT LUMBAR FUSION Right 11/03/2017   Procedure: Right Lumbar three-four Anterolateral lumbar interbody fusion with lateral plate;  Surgeon: Erline Levine, MD;  Location: [facility];  Service: Neurosurgery;  Laterality: Right;  . BACK SURGERY  2011   hemiarthroplasty 01/11/1999 and 6  . CARDIAC CATHETERIZATION  09/2001   negative  . COLONOSCOPY    . EYE SURGERY  2003   R&L cataract removed & IOL- 2003 & 2007,Dr.Jenkins  . INGUINAL HERNIA REPAIR  2002   right, Dr. Truitt Leep  . JOINT REPLACEMENT  2012   left knee replacement  . LIPOMA EXCISION     back  . MENISCECTOMY Bilateral    Dr. Noemi Chapel  . PROSTATE SURGERY  07/2006   for urinary urgency, Dr. Risa Grill  . ROTATOR CUFF REPAIR Right October, 2016  . sinus & throat surgery-1995  1995  . TONSILLECTOMY    . TOTAL KNEE ARTHROPLASTY  01/11/2011   Procedure: TOTAL KNEE ARTHROPLASTY;  Surgeon: Rudean Haskell, MD;  Location: [facility];  Service: Orthopedics;  Laterality: Left;  . TOTAL KNEE ARTHROPLASTY Right 11/22/2016   Procedure: TOTAL KNEE ARTHROPLASTY;  Surgeon: Vickey Huger, MD;  Location: [facility];  Service: Orthopedics;  Laterality: Right;  . UVULECTOMY  6606   Dr. Erik Obey  . VASECTOMY    . WRIST SURGERY  1959   fracture- right    Family History  Problem Relation Age of Onset  . Sudden death Father        MVA  . Coronary artery disease Mother   . Emphysema Mother   . Diverticulosis Mother   . Thyroid disease Mother   . Asthma Brother   .  Bone cancer Brother   . Diabetes Paternal Grandmother   . Sarcoidosis Son   . Thyroid disease Daughter   . Anesthesia problems Neg Hx   . Hypotension Neg Hx   . Malignant hyperthermia Neg Hx   . Pseudochol deficiency Neg Hx   . Colon cancer Neg Hx     Social History:  reports that he quit smoking about 53 years ago. He has a 18.00 pack-year smoking history. He has never used smokeless tobacco. He reports current alcohol use of about 4.0 standard drinks of alcohol per week. He reports that he does not use drugs.  Allergies:  Allergies  Allergen Reactions  . Simvastatin Other (See Comments)    MYALGIAS  . Oxycodone Other (See Comments)    Severe constipation    Allergies as of 09/12/2019      Reactions   Simvastatin Other (See Comments)   MYALGIAS   Oxycodone Other (See Comments)   Severe constipation      Medication List       Accurate as of September 12, 2019  3:06 PM. If you have any questions, ask your nurse or doctor.        acetaminophen 500 MG tablet Commonly known as: TYLENOL Take 1,000 mg by mouth every 6 (six) hours as needed for mild pain.   ARTIFICIAL TEARS OP Place 1 drop into both eyes 2 (two) times daily.   aspirin 81 MG tablet Take 81 mg by mouth every evening.   CALCIUM 600 + D PO Take 1 tablet by mouth daily.   CITRUCEL PO Take by mouth at bedtime.   Fish Oil 1000 MG Caps Take 1,000 mg by mouth every evening.   Flomax 0.4 MG Caps capsule Generic drug: tamsulosin Take 0.4 mg by mouth 2 (two) times daily.   glucose blood test strip Commonly known as: PRECISION  XTRA TEST STRIPS USE TO CHECK BLOOD SUGAR ONCE DAILY DX E11.9   Lancets Ultra Thin 30G Misc Patient is to use to check blood glucose in the AM fasting and 2 hours after largest meal.   losartan 50 MG tablet Commonly known as: COZAAR Take 1 tablet (50 mg total) by mouth daily.   multivitamin with minerals Tabs tablet Take 1 tablet by mouth daily.   NONFORMULARY OR COMPOUNDED ITEM Glucometer (Precision XTRA- Model #[id number])   Restora Caps One tab qd What changed:   how much to take  how to take this  when to take this  additional instructions   rosuvastatin 5 MG tablet Commonly known as: Crestor Take 1 tablet (5 mg total) by mouth at bedtime.   Synthroid 150 MCG tablet Generic drug: levothyroxine TAKE 1 TABLET DAILY BEFORE BREAKFAST   vitamin B-12 1000 MCG tablet Commonly known as: CYANOCOBALAMIN Take 1,000 mcg by mouth daily.        Review of Systems  Complaining of daytime somnolence, shortness of breath on exertion and fatigue Has not been able to see his PCP for this  Taking losartan 50 mg for hypertension  He has history of prediabetes with last A1c 6.2, followed by PCP Usually does not check his blood sugars since they are normal        Examination:    BP 130/66 (BP Location: Left Arm, Patient Position: Sitting, Cuff Size: Normal)   Pulse (!) 49   Ht 5\' 10"  (1.778 m)   Wt 196 lb 9.6 oz (89.2 kg)   SpO2 96%   BMI 28.21 kg/m   He  looks alert Thyroid not enlarged Heart sounds normal with a short ejection murmur at the apex Lungs clear No edema Biceps reflexes difficult to elicit but appear normal   Assessment:   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 Sodium levels have been more recently better with sodium tablets and fluid restriction  Last sodium was 131 and not clear if his current symptoms are related to worsening hyponatremia  HYPOTHYROIDISM: He had minimal symptoms at baseline and not  clear if his recent symptoms of fatigue, somnolence are related to hypothyroidism since he just had normal labs about 6 weeks ago  Shortness of breath on exertion and fatigue: Rule out anemia Also likely needs more detailed evaluation for other causes  PLAN:   Chemistry panel and thyroid levels to be done today CBC to be checked today We will decide on treatment based on his labs If labs are not significantly different we will have him see his PCP urgently    Kyle Simon 09/12/2019, 3:06 PM   Sodium 129, TSH 6.6 Result note sent as follows: Needs to try and take a second salt tablet daily after dinner.  Also consistently restrict water intake. Thyroid is slightly lower, add another half tablet extra per week of levothyroxine. Do not think these abnormalities are causing his symptoms especially shortness of breath and needs to see Dr. Einar Pheasant as soon as possible  Note: This office note was prepared with Dragon voice recognition system technology. Any transcriptional errors that result from this process are unintentional.

## 2019-09-13 ENCOUNTER — Other Ambulatory Visit: Payer: Self-pay

## 2019-09-13 DIAGNOSIS — E063 Autoimmune thyroiditis: Secondary | ICD-10-CM

## 2019-09-13 MED ORDER — LEVOTHYROXINE SODIUM 150 MCG PO TABS: 150.0000 ug | ORAL_TABLET | Freq: Every day | ORAL | 3 refills | Status: DC

## 2019-09-17 ENCOUNTER — Other Ambulatory Visit: Payer: Self-pay

## 2019-09-17 ENCOUNTER — Ambulatory Visit (INDEPENDENT_AMBULATORY_CARE_PROVIDER_SITE_OTHER): Payer: Medicare Other | Admitting: Dermatology

## 2019-09-17 DIAGNOSIS — D229 Melanocytic nevi, unspecified: Secondary | ICD-10-CM

## 2019-09-17 DIAGNOSIS — L570 Actinic keratosis: Secondary | ICD-10-CM | POA: Diagnosis not present

## 2019-09-17 DIAGNOSIS — D225 Melanocytic nevi of trunk: Secondary | ICD-10-CM

## 2019-09-17 DIAGNOSIS — Z1283 Encounter for screening for malignant neoplasm of skin: Secondary | ICD-10-CM

## 2019-09-17 DIAGNOSIS — L729 Follicular cyst of the skin and subcutaneous tissue, unspecified: Secondary | ICD-10-CM

## 2019-09-18 DIAGNOSIS — Z96653 Presence of artificial knee joint, bilateral: Secondary | ICD-10-CM | POA: Diagnosis not present

## 2019-09-20 ENCOUNTER — Encounter: Payer: Self-pay | Admitting: Family Medicine

## 2019-09-20 ENCOUNTER — Ambulatory Visit (INDEPENDENT_AMBULATORY_CARE_PROVIDER_SITE_OTHER): Payer: Medicare Other | Admitting: Family Medicine

## 2019-09-20 ENCOUNTER — Other Ambulatory Visit: Payer: Self-pay

## 2019-09-20 VITALS — BP 110/78 | HR 65 | Temp 98.0°F | Ht 68.5 in | Wt 198.0 lb

## 2019-09-20 DIAGNOSIS — E1169 Type 2 diabetes mellitus with other specified complication: Secondary | ICD-10-CM

## 2019-09-20 DIAGNOSIS — E1159 Type 2 diabetes mellitus with other circulatory complications: Secondary | ICD-10-CM | POA: Diagnosis not present

## 2019-09-20 DIAGNOSIS — E039 Hypothyroidism, unspecified: Secondary | ICD-10-CM

## 2019-09-20 DIAGNOSIS — E785 Hyperlipidemia, unspecified: Secondary | ICD-10-CM

## 2019-09-20 DIAGNOSIS — I10 Essential (primary) hypertension: Secondary | ICD-10-CM | POA: Diagnosis not present

## 2019-09-20 DIAGNOSIS — I152 Hypertension secondary to endocrine disorders: Secondary | ICD-10-CM

## 2019-09-20 DIAGNOSIS — N400 Enlarged prostate without lower urinary tract symptoms: Secondary | ICD-10-CM

## 2019-09-20 DIAGNOSIS — I700 Atherosclerosis of aorta: Secondary | ICD-10-CM

## 2019-09-20 MED ORDER — ROSUVASTATIN CALCIUM 5 MG PO TABS: 5.0000 mg | ORAL_TABLET | Freq: Every day | ORAL | 3 refills | Status: DC

## 2019-09-20 MED ORDER — LOSARTAN POTASSIUM 50 MG PO TABS: 50.0000 mg | ORAL_TABLET | Freq: Every day | ORAL | 3 refills | Status: DC

## 2019-09-20 NOTE — Assessment & Plan Note (Signed)
BP controlled ?- Continue losartan ?

## 2019-09-20 NOTE — Assessment & Plan Note (Signed)
Lab Results  Component Value Date   LDLCALC 49 02/16/2019   LDL at goal. Cont Crestor

## 2019-09-20 NOTE — Assessment & Plan Note (Signed)
On statin and asa

## 2019-09-20 NOTE — Assessment & Plan Note (Signed)
Follows with endocrinology. Cont levothyroxine

## 2019-09-20 NOTE — Progress Notes (Signed)
Subjective:     Kyle Simon is a 86 y.o. male presenting for Establish Care and Medication Refill     HPI  #Fatigue/SOB - complained to his endocrinologist - he blood work did not explain - sob has resolved  Doing well on current medications  Sees endocrinology    Review of Systems   Social History   Tobacco Use  Smoking Status Former Smoker  . Packs/day: 1.00  . Years: 18.00  . Pack years: 18.00  . Quit date: 03/01/1966  . Years since quitting: 53.5  Smokeless Tobacco Never Used        Objective:    BP Readings from Last 3 Encounters:  09/20/19 110/78  09/12/19 130/66  07/24/19 130/80   Wt Readings from Last 3 Encounters:  09/20/19 198 lb (89.8 kg)  09/12/19 196 lb 9.6 oz (89.2 kg)  07/24/19 195 lb (88.5 kg)    BP 110/78   Pulse 65   Temp 98 F (36.7 C) (Temporal)   Ht 5' 8.5" (1.74 m)   Wt 198 lb (89.8 kg)   SpO2 98%   BMI 29.67 kg/m    Physical Exam Constitutional:      Appearance: Normal appearance. He is not ill-appearing or diaphoretic.  HENT:     Right Ear: External ear normal.     Left Ear: External ear normal.  Eyes:     General: No scleral icterus.    Extraocular Movements: Extraocular movements intact.     Conjunctiva/sclera: Conjunctivae normal.  Cardiovascular:     Rate and Rhythm: Normal rate and regular rhythm.     Heart sounds: No murmur heard.   Pulmonary:     Effort: Pulmonary effort is normal. No respiratory distress.     Breath sounds: Normal breath sounds. No wheezing.  Musculoskeletal:     Cervical back: Neck supple.  Skin:    General: Skin is warm and dry.  Neurological:     Mental Status: He is alert. Mental status is at baseline.  Psychiatric:        Mood and Affect: Mood normal.           Assessment & Plan:   Problem List Items Addressed This Visit      Cardiovascular and Mediastinum   Hypertension associated with diabetes ([place]) - Primary (Chronic)    BP controlled. Continue losartan.         Relevant Medications   rosuvastatin (CRESTOR) 5 MG tablet   losartan (COZAAR) 50 MG tablet   Aortic atherosclerosis ([facility])    On statin and asa      Relevant Medications   rosuvastatin (CRESTOR) 5 MG tablet   losartan (COZAAR) 50 MG tablet     Endocrine   Hypothyroidism (Chronic)    Follows with endocrinology. Cont levothyroxine      Hyperlipidemia associated with type 2 diabetes mellitus ([facility]) (Chronic)    Lab Results  Component Value Date   LDLCALC 49 02/16/2019   LDL at goal. Cont Crestor      Relevant Medications   rosuvastatin (CRESTOR) 5 MG tablet   losartan (COZAAR) 50 MG tablet     Genitourinary   BPH (benign prostatic hyperplasia) (Chronic)    Stable on tamsulosin. Cont medication          Return in about 6 months (around 03/22/2020) for wellness visit.  Lesleigh Noe, MD  This visit occurred during the SARS-CoV-2 public health emergency.  Safety protocols were in place, including screening questions prior  to the visit, additional usage of staff PPE, and extensive cleaning of exam room while observing appropriate contact time as indicated for disinfecting solutions.

## 2019-09-20 NOTE — Assessment & Plan Note (Signed)
Stable on tamsulosin. Cont medication

## 2019-09-22 ENCOUNTER — Encounter: Payer: Self-pay | Admitting: Dermatology

## 2019-09-22 NOTE — Progress Notes (Signed)
   Follow-Up Visit   Subjective  Kyle Simon is a 86 y.o. male who presents for the following: Skin Problem (Check both sides of face. Some scab like spots that itch sometimes. Also has cyst on back. No problems. ).  Crusts Location: Sun exposed areas Duration:  Quality: Sometimes sensitive Associated Signs/Symptoms: Modifying Factors:  Severity:  Timing: Context:   Objective  Well appearing patient in no apparent distress; mood and affect are within normal limits.  All skin waist up examined.   Assessment & Plan    AK (actinic keratosis) (10) Right Dorsal Hand (3); Right Superior Helix; Left Zygomatic Area (3); Left Temporal Scalp; Right Temporal Scalp (2)  Destruction of lesion - Left Temporal Scalp, Left Zygomatic Area (3), Right Dorsal Hand, Right Superior Helix, Right Temporal Scalp (2) Complexity: simple   Destruction method: cryotherapy   Informed consent: discussed and consent obtained   Timeout:  patient name, date of birth, surgical site, and procedure verified Lesion destroyed using liquid nitrogen: Yes   Region frozen until ice ball extended beyond lesion: Yes   Cryotherapy cycles:  5 Outcome: patient tolerated procedure well with no complications    Cyst of skin Mid Back  Patient content to leave this be.  Nevus Left Upper Back  Annual skin examination Skin cancer screening performed today.     I, Lavonna Monarch, MD, have reviewed all documentation for this visit.  The documentation on 09/22/19 for the exam, diagnosis, procedures, and orders are all accurate and complete.

## 2019-10-01 DIAGNOSIS — L603 Nail dystrophy: Secondary | ICD-10-CM | POA: Diagnosis not present

## 2019-10-01 DIAGNOSIS — I739 Peripheral vascular disease, unspecified: Secondary | ICD-10-CM | POA: Diagnosis not present

## 2019-10-01 DIAGNOSIS — E1151 Type 2 diabetes mellitus with diabetic peripheral angiopathy without gangrene: Secondary | ICD-10-CM | POA: Diagnosis not present

## 2019-10-01 DIAGNOSIS — L84 Corns and callosities: Secondary | ICD-10-CM | POA: Diagnosis not present

## 2019-10-22 DIAGNOSIS — Z20822 Contact with and (suspected) exposure to covid-19: Secondary | ICD-10-CM | POA: Diagnosis not present

## 2019-10-24 ENCOUNTER — Encounter: Payer: Self-pay | Admitting: Gastroenterology

## 2019-10-24 ENCOUNTER — Ambulatory Visit (INDEPENDENT_AMBULATORY_CARE_PROVIDER_SITE_OTHER): Payer: Medicare Other | Admitting: Gastroenterology

## 2019-10-24 VITALS — BP 120/60 | HR 56 | Ht 70.0 in | Wt 196.0 lb

## 2019-10-24 DIAGNOSIS — R140 Abdominal distension (gaseous): Secondary | ICD-10-CM | POA: Diagnosis not present

## 2019-10-24 DIAGNOSIS — K5900 Constipation, unspecified: Secondary | ICD-10-CM

## 2019-10-24 NOTE — Patient Instructions (Signed)
If you are age 84 or older, your body mass index should be between 23-30. Your Body mass index is 28.12 kg/m. If this is out of the aforementioned range listed, please consider follow up with your Primary Care Provider.  If you are age 13 or younger, your body mass index should be between 19-25. Your Body mass index is 28.12 kg/m. If this is out of the aformentioned range listed, please consider follow up with your Primary Care Provider.   Please eliminate greens (ex: lettuce, kale, spinach, etc.) from your diet.  You can use pill form of fiber instead of powder.  Please call our office in 4 - 5 weeks to let us know how you are feeling.   Thank you for entrusting me with your care and choosing [facility]  Dr Ardis Hughs

## 2019-10-24 NOTE — Progress Notes (Signed)
Review of pertinent gastrointestinal problems: 1.Routine risk for colon cancer. Colonoscopy Dr. Ardis Hughs March 2014 showed diverticulosis. 2.  Constipation and borborygmi.  Evaluated 2020 and GI.  MiraLAX was helping however he was still bothered by a lot of gas.   HPI: This is a very pleasant 86 year old man whom I last saw 4 months ago.  He is here with his wife today.  He has a lot of knee pains lately and is seeing his orthopedist later this week.   I last saw him about 4 months ago.  At that time he was taking Citrucel once daily.  He was still quite bothered by gassiness.  Trial of Linzess in the past has caused significant loose stools.  At that point overall he was quite happy with his bowels.  I recommended he try taking the Citrucel on an every other day basis instead of every single day for a while.  He was going to increase his Gas-X to 2 pills daily instead of 1 pill at bedtime.  He called about 2 months after that saying he was having some mucus and intermittent loose stools.  Blood work July 2021 shows hemoglobin 12.5, TSH 6.6 which is elevated  He is overall pretty happy with the effects of the Citrucel.  He has had to take it every day because if he does not he becomes constipated.  He is bothered by gassiness and sometimes he will expel a bit of liquid when he passes gas.  He has had no bleeding   ROS: complete GI ROS as described in HPI, all other review negative.  Constitutional:  No unintentional weight loss   Past Medical History:  Diagnosis Date  . Arthritis    both hands;Dr.Deveshwar  . Constipation due to pain medication   . Diabetes mellitus without complication ([place])    Type II. Uses no medication. Pt controls with diet and weight  . Enlarged prostate   . Frequent urination at night   . GERD (gastroesophageal reflux disease)   . History of noncompliance with medical treatment, presenting hazards to health 11/20/2018  . Hyperlipidemia   . Hyperlipidemia  associated with type 2 diabetes mellitus ([place]) 04/19/2007   Qualifier: Diagnosis of  By: Linna Darner MD, Gwyndolyn Saxon    . Hypertension   . Hypothyroidism   . Polio    as a child w/o complications  . Snoring    no sleep apnea  . Statin declined-  11/20/2018   patient understands risks associated with this decision and has been advised to restart by cardiologist as well as myself several times  . Torn meniscus   . Transfusion history 2012   post TKR    Past Surgical History:  Procedure Laterality Date  . ANTERIOR LAT LUMBAR FUSION Right 11/03/2017   Procedure: Right Lumbar three-four Anterolateral lumbar interbody fusion with lateral plate;  Surgeon: Erline Levine, MD;  Location: [facility];  Service: Neurosurgery;  Laterality: Right;  . BACK SURGERY  2011   hemiarthroplasty 01/11/1999 and 6  . CARDIAC CATHETERIZATION  09/2001   negative  . COLONOSCOPY    . EYE SURGERY  2003   R&L cataract removed & IOL- 2003 & 2007,Dr.Jenkins  . INGUINAL HERNIA REPAIR  2002   right, Dr. Truitt Leep  . JOINT REPLACEMENT  2012   left knee replacement  . LIPOMA EXCISION     back  . MENISCECTOMY Bilateral    Dr. Noemi Chapel  . PROSTATE SURGERY  07/2006   for urinary urgency, Dr. Risa Grill  .  ROTATOR CUFF REPAIR Right October, 2016  . sinus & throat surgery-1995  1995  . TONSILLECTOMY    . TOTAL KNEE ARTHROPLASTY  01/11/2011   Procedure: TOTAL KNEE ARTHROPLASTY;  Surgeon: Rudean Haskell, MD;  Location: [facility];  Service: Orthopedics;  Laterality: Left;  . TOTAL KNEE ARTHROPLASTY Right 11/22/2016   Procedure: TOTAL KNEE ARTHROPLASTY;  Surgeon: Vickey Huger, MD;  Location: [facility];  Service: Orthopedics;  Laterality: Right;  . UVULECTOMY  4580   Dr. Erik Obey  . VASECTOMY    . WRIST SURGERY  1959   fracture- right    Current Outpatient Medications  Medication Sig Dispense Refill  . acetaminophen (TYLENOL) 500 MG tablet Take 1,000 mg by mouth every 6 (six) hours as needed for mild pain.     Marland Kitchen aspirin 81 MG tablet Take 81  mg by mouth every evening.     . Calcium Carb-Cholecalciferol (CALCIUM 600 + D PO) Take 1 tablet by mouth daily.     . celecoxib (CELEBREX) 200 MG capsule Take by mouth 2 (two) times daily.    . diclofenac Sodium (VOLTAREN) 1 % GEL Apply topically.    Marland Kitchen glucose blood (PRECISION XTRA TEST STRIPS) test strip USE TO CHECK BLOOD SUGAR ONCE DAILY DX E11.9 100 each 2  . Hypromellose (ARTIFICIAL TEARS OP) Place 1 drop into both eyes 2 (two) times daily.    Marland Kitchen LANCETS ULTRA THIN 30G MISC Patient is to use to check blood glucose in the AM fasting and 2 hours after largest meal. 100 each 6  . levothyroxine (SYNTHROID) 150 MCG tablet Take 1 tablet (150 mcg total) by mouth daily before breakfast. 90 tablet 3  . losartan (COZAAR) 50 MG tablet Take 1 tablet (50 mg total) by mouth daily. 90 tablet 3  . Methylcellulose, Laxative, (CITRUCEL PO) Take by mouth at bedtime.    . Multiple Vitamin (MULTIVITAMIN WITH MINERALS) TABS Take 1 tablet by mouth daily.    . NONFORMULARY OR COMPOUNDED ITEM Glucometer (Precision XTRA- Model #[id number] [id number]) 1 each 0  . Omega-3 Fatty Acids (FISH OIL) 1000 MG CAPS Take 1,000 mg by mouth every evening.    . Probiotic Product (RESTORA) CAPS One tab qd (Patient taking differently: Take 1 capsule by mouth every evening. ) 90 capsule 3  . rosuvastatin (CRESTOR) 5 MG tablet Take 1 tablet (5 mg total) by mouth at bedtime. 90 tablet 3  . Tamsulosin HCl (FLOMAX) 0.4 MG CAPS Take 0.4 mg by mouth 2 (two) times daily.     . vitamin B-12 (CYANOCOBALAMIN) 1000 MCG tablet Take 1,000 mcg by mouth daily.     No current facility-administered medications for this visit.    Allergies as of 10/24/2019 - Review Complete 10/24/2019  Allergen Reaction Noted  . Simvastatin Other (See Comments) 04/18/2007  . Oxycodone Other (See Comments) 10/11/2013    Family History  Problem Relation Age of Onset  . Sudden death Father        accident  . Coronary artery disease Mother   . Emphysema Mother   .  Diverticulosis Mother   . Thyroid disease Mother   . Asthma Brother   . Bone cancer Brother   . Diabetes Paternal Grandmother   . Sarcoidosis Son   . Thyroid disease Daughter   . Anesthesia problems Neg Hx   . Hypotension Neg Hx   . Malignant hyperthermia Neg Hx   . Pseudochol deficiency Neg Hx   . Colon cancer Neg Hx     Social  History   Socioeconomic History  . Marital status: Married    Spouse name: June  . Number of children: 7  . Years of education: 2 years college  . Highest education level: Not on file  Occupational History  . Not on file  Tobacco Use  . Smoking status: Former Smoker    Packs/day: 1.00    Years: 18.00    Pack years: 18.00    Quit date: 03/01/1966    Years since quitting: 53.6  . Smokeless tobacco: Never Used  Vaping Use  . Vaping Use: Never used  Substance and Sexual Activity  . Alcohol use: Yes    Alcohol/week: 4.0 standard drinks    Types: 4 Shots of liquor per week    Comment: social, few drinks a week  . Drug use: No  . Sexual activity: Yes    Birth control/protection: Post-menopausal, Surgical  Other Topics Concern  . 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 Difficulty of Paying Living Expenses: Not on file  Food Insecurity:   . Worried About Charity fundraiser in the Last Year: Not on file  . Ran Out of Food in the Last Year: Not on file  Transportation Needs:   .  Lack of Transportation (Medical): Not on file  . Lack of Transportation (Non-Medical): Not on file  Physical Activity:   . Days of Exercise per Week: Not on file  . Minutes of Exercise per Session: Not on file  Stress:   . Feeling of Stress : Not on file  Social Connections:   . Frequency of Communication with Friends and Family: Not on file  . Frequency of Social Gatherings with Friends and Family: Not on file  . Attends Religious Services: Not on file  . Active Member of Clubs or Organizations: Not on file  . Attends Archivist Meetings: Not on file  . Marital Status: Not on file  Intimate Partner Violence:   . Fear of Current or Ex-Partner: Not on file  . Emotionally Abused: Not on file  . Physically Abused: Not on file  . Sexually Abused: Not on file     Physical Exam: Ht 5\' 10"  (1.778 m)   Wt 196 lb (88.9 kg)   BMI 28.12 kg/m  Constitutional: generally well-appearing Psychiatric: alert and oriented x3 Abdomen: soft, nontender, nondistended, no obvious ascites, no peritoneal signs, normal bowel sounds No peripheral edema noted in lower extremities  Assessment and plan: 86 y.o. male with improved constipation, gassiness  We discussed the fiber supplements often cause gassiness as a side effect.  He is going to try different type of fiber supplement instead of the Citrucel powder.  He is going to try pills instead.  He is also going to try to avoid greens in his diet  for now.  He will call to report on his response in 4 to 5 weeks.  Please see the "Patient Instructions" section for addition details about the plan.  Owens Loffler, MD [facility] 10/24/2019, 9:11 AM   Total time on date of encounter was 25 minutes (this included time spent preparing to see the patient reviewing records; obtaining and/or reviewing separately obtained history; performing a medically appropriate exam and/or evaluation; counseling and educating the patient and family if  present; ordering medications, tests or procedures if applicable; and documenting clinical information in the health record).

## 2019-10-26 DIAGNOSIS — Z96653 Presence of artificial knee joint, bilateral: Secondary | ICD-10-CM | POA: Diagnosis not present

## 2019-11-06 ENCOUNTER — Other Ambulatory Visit: Payer: Self-pay

## 2019-11-06 ENCOUNTER — Telehealth: Payer: Self-pay | Admitting: Endocrinology

## 2019-11-06 DIAGNOSIS — E063 Autoimmune thyroiditis: Secondary | ICD-10-CM

## 2019-11-06 MED ORDER — LEVOTHYROXINE SODIUM 150 MCG PO TABS: 150.0000 ug | ORAL_TABLET | Freq: Every day | ORAL | 0 refills | Status: DC

## 2019-11-06 NOTE — Telephone Encounter (Signed)
Pt contacted [facility] over the weekend on 11/05/19 he forgot his thyroid medication at home and [place] the beach.   Called pt to see if he still needs the medication. 6 tabs are needed to be sent to [facility] in [place] # [phone] Pharmacist is Legrand Como

## 2019-11-06 NOTE — Telephone Encounter (Signed)
Rx sent 

## 2019-11-20 ENCOUNTER — Ambulatory Visit (INDEPENDENT_AMBULATORY_CARE_PROVIDER_SITE_OTHER): Payer: Medicare Other | Admitting: Endocrinology

## 2019-11-20 ENCOUNTER — Other Ambulatory Visit: Payer: Self-pay

## 2019-11-20 VITALS — BP 142/78 | HR 73 | Ht 70.0 in | Wt 196.0 lb

## 2019-11-20 DIAGNOSIS — E063 Autoimmune thyroiditis: Secondary | ICD-10-CM

## 2019-11-20 DIAGNOSIS — E871 Hypo-osmolality and hyponatremia: Secondary | ICD-10-CM | POA: Diagnosis not present

## 2019-11-20 DIAGNOSIS — R7303 Prediabetes: Secondary | ICD-10-CM

## 2019-11-20 LAB — COMPREHENSIVE METABOLIC PANEL
ALT: 12 U/L (ref 0–53)
AST: 13 U/L (ref 0–37)
Albumin: 4.1 g/dL (ref 3.5–5.2)
Alkaline Phosphatase: 73 U/L (ref 39–117)
BUN: 17 mg/dL (ref 6–23)
CO2: 26 mEq/L (ref 19–32)
Calcium: 9.4 mg/dL (ref 8.4–10.5)
Chloride: 94 mEq/L — ABNORMAL LOW (ref 96–112)
Creatinine, Ser: 1.02 mg/dL (ref 0.40–1.50)
GFR: 69.46 mL/min (ref >60.00)
Glucose, Bld: 142 mg/dL — ABNORMAL HIGH (ref 70–99)
Potassium: 4.7 mEq/L (ref 3.5–5.1)
Sodium: 126 mEq/L — ABNORMAL LOW (ref 135–145)
Total Bilirubin: 0.7 mg/dL (ref 0.2–1.2)
Total Protein: 7 g/dL (ref 6.0–8.3)

## 2019-11-20 LAB — HEMOGLOBIN A1C: Hgb A1c MFr Bld: 6.8 % — ABNORMAL HIGH (ref 4.6–6.5)

## 2019-11-20 LAB — T4, FREE: Free T4: 1.3 ng/dL (ref 0.60–1.60)

## 2019-11-20 LAB — TSH: TSH: 2.22 u[IU]/mL (ref 0.35–4.50)

## 2019-11-20 NOTE — Progress Notes (Signed)
Patient ID: Kyle Simon, male   DOB: 1932-10-06, 86 y.o.   MRN: [id number]            Reason for Appointment: Fatigue and follow-up visit    History of Present Illness:   HYPONATREMIA:  Background history:  Serum sodium has been low since at least 2011 and has been as low as 125 in the past More recently lowest reading 127 He has not had any associated symptoms of decreased appetite, nausea or drowsiness Not on any thiazide diuretics or SSRI drugs Has had negative endocrine work-up for cortisol deficiencies  Has a history of drinking a lot of water partly for his constipation  Recent history: His urine osmolality is last high at 443 done in 06/2019, was also high at 442 on initial evaluation Urine sodium was over 90  He was told to cut back on his fluid intake  He has reduced his fluid intake although he has been told to increase fluids for constipation by gastroenterologist  He was also been taking somewhat more salt tablets recently He cannot tolerate more than 1 tablet daily because of nausea He has obtained some sodium capsules from [employer] but these do not have as much sodium as regular salt tablet He takes 1-1/2 now tablets in the morning and half in the evening  No change in how he feels recently, still tends to take a nap in the afternoon and may go to bed early  Last sodium was relatively low at 129 compared to previous level at 131  Labs pending   Lab Results  Component Value Date   NA 129 (L) 09/12/2019   K 4.4 09/12/2019   CL 96 09/12/2019   CO2 27 09/12/2019     Hypothyroidism was first diagnosed  10-15 years ago   At the time of diagnosis patient was not having symptoms of  fatigue, cold sensitivity, difficulty concentrating, dry skin, weight gain Likely had only done routine testing and was told that he was hypothyroid and started him on unknown dose of levothyroxine  He did not start feeling any different when he started taking levothyroxine  initially Also usually does not feel any different with dosage adjustments that have been made Apparently his dose was 150 g for some time but this was reduced in 06/2015 when his TSH was low normal Since then he has been taking 150 g 5 days a week and 125 g twice a week Non-insulin initial consultation he was complaining that since spring time his energy level has been not as good and he has not been able to do as much with activities as he used to   He was referred here because his TSH is tending to be high but also his free T4 was relatively higher in 7/18  Recent history: On his initial consultation since his TSH was still relatively high and he was taking the equivalent of about 142 g of levothyroxine daily he was switched to taking 150 g once daily Also he was told to not take his levothyroxine at bedtime but take it before breakfast  He is taking his levothyroxine consistently before breakfast every morning Not taking any multivitamins in the morning at the same time  The last visit he was feeling much more tired which is less recently but he still tends to take a nap in the afternoon and will go to bed earlier  TSH had gone up to 6.6 in July He was told to take an extra half  tablet weekly while he is taking home extra tablet every week of the 150 mcg dose   Patient's weight history is as follows:  Wt Readings from Last 3 Encounters:  11/20/19 196 lb (88.9 kg)  10/24/19 196 lb (88.9 kg)  09/20/19 198 lb (89.8 kg)    Thyroid function results have been as follows:  Lab Results  Component Value Date   TSH 6.58 (H) 09/12/2019   TSH 3.78 07/24/2019   TSH 3.350 02/16/2019   TSH 3.09 12/11/2018   FREET4 1.12 09/12/2019   FREET4 1.27 07/24/2019   FREET4 1.66 02/16/2019   FREET4 1.81 (H) 11/15/2018   T3FREE 3.2 12/28/2016   T3FREE 2.7 10/17/2015    Lab Results  Component Value Date   TSH 6.58 (H) 09/12/2019   TSH 3.78 07/24/2019   TSH 3.350 02/16/2019   TSH 3.09  12/11/2018   TSH 4.910 (H) 11/15/2018   TSH 6.950 (H) 08/22/2017   TSH 2.87 04/26/2017   TSH 3.85 12/28/2016   TSH 5.860 (H) 09/15/2016     Past Medical History:  Diagnosis Date  . Arthritis    both hands;Dr.Deveshwar  . Constipation due to pain medication   . Diabetes mellitus without complication ([place])    Type II. Uses no medication. Pt controls with diet and weight  . Enlarged prostate   . Frequent urination at night   . GERD (gastroesophageal reflux disease)   . History of noncompliance with medical treatment, presenting hazards to health 11/20/2018  . Hyperlipidemia   . Hyperlipidemia associated with type 2 diabetes mellitus ([place]) 04/19/2007   Qualifier: Diagnosis of  By: Linna Darner MD, Gwyndolyn Saxon    . Hypertension   . Hypothyroidism   . Polio    as a child w/o complications  . Snoring    no sleep apnea  . Statin declined-  11/20/2018   patient understands risks associated with this decision and has been advised to restart by cardiologist as well as myself several times  . Torn meniscus   . Transfusion history 2012   post TKR    Past Surgical History:  Procedure Laterality Date  . ANTERIOR LAT LUMBAR FUSION Right 11/03/2017   Procedure: Right Lumbar three-four Anterolateral lumbar interbody fusion with lateral plate;  Surgeon: Erline Levine, MD;  Location: [facility];  Service: Neurosurgery;  Laterality: Right;  . BACK SURGERY  2011   hemiarthroplasty 01/11/1999 and 6  . CARDIAC CATHETERIZATION  09/2001   negative  . COLONOSCOPY    . EYE SURGERY  2003   R&L cataract removed & IOL- 2003 & 2007,Dr.Jenkins  . INGUINAL HERNIA REPAIR  2002   right, Dr. Truitt Leep  . JOINT REPLACEMENT  2012   left knee replacement  . LIPOMA EXCISION     back  . MENISCECTOMY Bilateral    Dr. Noemi Chapel  . PROSTATE SURGERY  07/2006   for urinary urgency, Dr. Risa Grill  . ROTATOR CUFF REPAIR Right October, 2016  . sinus & throat surgery-1995  1995  . TONSILLECTOMY    . TOTAL KNEE ARTHROPLASTY   01/11/2011   Procedure: TOTAL KNEE ARTHROPLASTY;  Surgeon: Rudean Haskell, MD;  Location: [facility];  Service: Orthopedics;  Laterality: Left;  . TOTAL KNEE ARTHROPLASTY Right 11/22/2016   Procedure: TOTAL KNEE ARTHROPLASTY;  Surgeon: Vickey Huger, MD;  Location: [facility];  Service: Orthopedics;  Laterality: Right;  . UVULECTOMY  0962   Dr. Erik Obey  . VASECTOMY    . WRIST SURGERY  1959   fracture- right  Family History  Problem Relation Age of Onset  . Sudden death Father        accident  . Coronary artery disease Mother   . Emphysema Mother   . Diverticulosis Mother   . Thyroid disease Mother   . Asthma Brother   . Bone cancer Brother   . Diabetes Paternal Grandmother   . Sarcoidosis Son   . Thyroid disease Daughter   . Anesthesia problems Neg Hx   . Hypotension Neg Hx   . Malignant hyperthermia Neg Hx   . Pseudochol deficiency Neg Hx   . Colon cancer Neg Hx     Social History:  reports that he quit smoking about 53 years ago. He has a 18.00 pack-year smoking history. He has never used smokeless tobacco. He reports current alcohol use of about 4.0 standard drinks of alcohol per week. He reports that he does not use drugs.  Allergies:  Allergies  Allergen Reactions  . Simvastatin Other (See Comments)    MYALGIAS  . Oxycodone Other (See Comments)    Severe constipation    Allergies as of 11/20/2019      Reactions   Simvastatin Other (See Comments)   MYALGIAS   Oxycodone Other (See Comments)   Severe constipation      Medication List       Accurate as of November 20, 2019  2:08 PM. If you have any questions, ask your nurse or doctor.        acetaminophen 500 MG tablet Commonly known as: TYLENOL Take 1,000 mg by mouth every 6 (six) hours as needed for mild pain.   ARTIFICIAL TEARS OP Place 1 drop into both eyes 2 (two) times daily.   aspirin 81 MG tablet Take 81 mg by mouth every evening.   CALCIUM 600 + D PO Take 1 tablet by mouth daily.   celecoxib 200  MG capsule Commonly known as: CELEBREX Take by mouth 2 (two) times daily.   CITRUCEL PO Take by mouth at bedtime.   diclofenac Sodium 1 % Gel Commonly known as: VOLTAREN Apply topically.   Fish Oil 1000 MG Caps Take 1,000 mg by mouth every evening.   Flomax 0.4 MG Caps capsule Generic drug: tamsulosin Take 0.4 mg by mouth 2 (two) times daily.   glucose blood test strip Commonly known as: PRECISION XTRA TEST STRIPS USE TO CHECK BLOOD SUGAR ONCE DAILY DX E11.9   Lancets Ultra Thin 30G Misc Patient is to use to check blood glucose in the AM fasting and 2 hours after largest meal.   levothyroxine 150 MCG tablet Commonly known as: Synthroid Take 1 tablet (150 mcg total) by mouth daily before breakfast.   losartan 50 MG tablet Commonly known as: COZAAR Take 1 tablet (50 mg total) by mouth daily.   multivitamin with minerals Tabs tablet Take 1 tablet by mouth daily.   NONFORMULARY OR COMPOUNDED ITEM Glucometer (Precision XTRA- Model #XCC [id number])   Restora Caps One tab qd What changed:   how much to take  how to take this  when to take this  additional instructions   rosuvastatin 5 MG tablet Commonly known as: Crestor Take 1 tablet (5 mg total) by mouth at bedtime.   vitamin B-12 1000 MCG tablet Commonly known as: CYANOCOBALAMIN Take 1,000 mcg by mouth daily.        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  Sodium levels have fluctuated Is taking sodium supplements because of difficulty getting sodium back to normal with fluid restriction  Still not able to do fluid  restriction adequately and not clear if this is effective in his case also  Last sodium was 129 and he has tried to increase his salt tablet intake   HYPOTHYROIDISM: He had more fatigue on his last visit and now taking 8 tablets a week of the 150 mcg levothyroxine because of increased TSH  He wants to have his A1c checked, history of prediabetes  PLAN:   BMP panel and thyroid levels to be done today We will discuss results and recommendations when reports are available    Elayne Snare 11/20/2019, 2:08 PM     Note: This office note was prepared with Dragon voice recognition system technology. Any transcriptional errors that result from this process are unintentional.

## 2019-11-21 ENCOUNTER — Other Ambulatory Visit: Payer: Self-pay | Admitting: *Deleted

## 2019-11-21 ENCOUNTER — Telehealth: Payer: Self-pay

## 2019-11-21 DIAGNOSIS — D499 Neoplasm of unspecified behavior of unspecified site: Secondary | ICD-10-CM

## 2019-11-21 DIAGNOSIS — Z87891 Personal history of nicotine dependence: Secondary | ICD-10-CM

## 2019-11-21 NOTE — Telephone Encounter (Signed)
-----   Message from Elayne Snare, MD sent at 11/21/2019  1:09 PM EDT ----- Sodium is lower, since salt tablets are not working he can stop this for now and start demeclocycline 150 mg daily.  This may not be covered by insurance.  Also would like to schedule him for CT scan of chest to rule out tumor agreeable with him.  Need follow-up in about 3 weeks, same day labs

## 2019-11-22 ENCOUNTER — Encounter (INDEPENDENT_AMBULATORY_CARE_PROVIDER_SITE_OTHER): Payer: Medicare Other | Admitting: Ophthalmology

## 2019-11-22 ENCOUNTER — Other Ambulatory Visit: Payer: Self-pay | Admitting: Endocrinology

## 2019-11-22 DIAGNOSIS — E871 Hypo-osmolality and hyponatremia: Secondary | ICD-10-CM

## 2019-11-22 DIAGNOSIS — Z87891 Personal history of nicotine dependence: Secondary | ICD-10-CM

## 2019-11-22 DIAGNOSIS — R0600 Dyspnea, unspecified: Secondary | ICD-10-CM

## 2019-11-22 DIAGNOSIS — Z122 Encounter for screening for malignant neoplasm of respiratory organs: Secondary | ICD-10-CM

## 2019-11-22 DIAGNOSIS — E222 Syndrome of inappropriate secretion of antidiuretic hormone: Secondary | ICD-10-CM

## 2019-11-22 MED ORDER — DEMECLOCYCLINE HCL 150 MG PO TABS: 150.0000 mg | ORAL_TABLET | Freq: Every day | ORAL | 0 refills | Status: DC

## 2019-11-26 ENCOUNTER — Other Ambulatory Visit: Payer: Self-pay

## 2019-11-26 ENCOUNTER — Encounter (INDEPENDENT_AMBULATORY_CARE_PROVIDER_SITE_OTHER): Payer: Medicare Other | Admitting: Ophthalmology

## 2019-11-26 DIAGNOSIS — E11311 Type 2 diabetes mellitus with unspecified diabetic retinopathy with macular edema: Secondary | ICD-10-CM

## 2019-11-26 DIAGNOSIS — E113312 Type 2 diabetes mellitus with moderate nonproliferative diabetic retinopathy with macular edema, left eye: Secondary | ICD-10-CM | POA: Diagnosis not present

## 2019-11-26 DIAGNOSIS — H43813 Vitreous degeneration, bilateral: Secondary | ICD-10-CM | POA: Diagnosis not present

## 2019-11-26 DIAGNOSIS — E113291 Type 2 diabetes mellitus with mild nonproliferative diabetic retinopathy without macular edema, right eye: Secondary | ICD-10-CM

## 2019-11-27 ENCOUNTER — Telehealth: Payer: Self-pay | Admitting: Endocrinology

## 2019-11-27 DIAGNOSIS — Z96653 Presence of artificial knee joint, bilateral: Secondary | ICD-10-CM | POA: Diagnosis not present

## 2019-11-27 DIAGNOSIS — M25461 Effusion, right knee: Secondary | ICD-10-CM | POA: Diagnosis not present

## 2019-11-27 NOTE — Telephone Encounter (Signed)
Patient called stating he was not sure as to why he was prescribed demeclocycline. He also states that he is taking an antibiotic called clindamycin for a dental procedure and wanted to make sure it was ok to take both of these together. Please advise. Patient ph# [phone]

## 2019-11-28 NOTE — Telephone Encounter (Signed)
Patient has been notified and verbalized understanding 

## 2019-11-28 NOTE — Telephone Encounter (Signed)
Demeclocycline is for his low sodium.  He can start when he finishes clindamycin

## 2019-11-29 DIAGNOSIS — Z7189 Other specified counseling: Secondary | ICD-10-CM | POA: Insufficient documentation

## 2019-11-29 NOTE — Progress Notes (Signed)
Cardiology Office Note   Date:  11/30/2019   ID:  [name], DOB March 10, 1932, MRN [id number]  PCP:  Lesleigh Noe, MD  Cardiologist:   Minus Breeding, MD   Chief Complaint  Patient presents with  . Shortness of Breath      History of Present Illness: Kyle Simon is a 86 y.o. male who is referred by Lesleigh Noe, MD for evaluation of SOB and abnormal EKG.   He had a low risk stress echo in 2012.  He had a distant cardiac cath.   He had dyspnea at a previous visit and I sent him for a [employer].  This was negative for ischemia.    Since I last saw him he has done well.  He denies any cardiovascular symptoms.  He stays physically active although he fatigues a little more with his household chores.  He moved to [place] so he does not have to do stairs.  He still drives and does his shopping. The patient denies any new symptoms such as chest discomfort, neck or arm discomfort. There has been no new shortness of breath, PND or orthopnea. There have been no reported palpitations, presyncope or syncope.   Past Medical History:  Diagnosis Date  . Arthritis    both hands;Dr.Deveshwar  . Constipation due to pain medication   . Diabetes mellitus without complication ([place])    Type II. Uses no medication. Pt controls with diet and weight  . Enlarged prostate   . Frequent urination at night   . GERD (gastroesophageal reflux disease)   . History of noncompliance with medical treatment, presenting hazards to health 11/20/2018  . Hyperlipidemia   . Hyperlipidemia associated with type 2 diabetes mellitus ([place]) 04/19/2007   Qualifier: Diagnosis of  By: Linna Darner MD, Gwyndolyn Saxon    . Hypertension   . Hypothyroidism   . Polio    as a child w/o complications  . Snoring    no sleep apnea  . Statin declined-  11/20/2018   patient understands risks associated with this decision and has been advised to restart by cardiologist as well as myself several times  . Torn meniscus   .  Transfusion history 2012   post TKR    Past Surgical History:  Procedure Laterality Date  . ANTERIOR LAT LUMBAR FUSION Right 11/03/2017   Procedure: Right Lumbar three-four Anterolateral lumbar interbody fusion with lateral plate;  Surgeon: Erline Levine, MD;  Location: [facility];  Service: Neurosurgery;  Laterality: Right;  . BACK SURGERY  2011   hemiarthroplasty 01/11/1999 and 6  . CARDIAC CATHETERIZATION  09/2001   negative  . COLONOSCOPY    . EYE SURGERY  2003   R&L cataract removed & IOL- 2003 & 2007,Dr.Jenkins  . INGUINAL HERNIA REPAIR  2002   right, Dr. Truitt Leep  . JOINT REPLACEMENT  2012   left knee replacement  . LIPOMA EXCISION     back  . MENISCECTOMY Bilateral    Dr. Noemi Chapel  . PROSTATE SURGERY  07/2006   for urinary urgency, Dr. Risa Grill  . ROTATOR CUFF REPAIR Right October, 2016  . sinus & throat surgery-1995  1995  . TONSILLECTOMY    . TOTAL KNEE ARTHROPLASTY  01/11/2011   Procedure: TOTAL KNEE ARTHROPLASTY;  Surgeon: Rudean Haskell, MD;  Location: [facility];  Service: Orthopedics;  Laterality: Left;  . TOTAL KNEE ARTHROPLASTY Right 11/22/2016   Procedure: TOTAL KNEE ARTHROPLASTY;  Surgeon: Vickey Huger, MD;  Location: [facility];  Service: Orthopedics;  Laterality: Right;  . UVULECTOMY  1962   Dr. Erik Obey  . VASECTOMY    . WRIST SURGERY  1959   fracture- right     Current Outpatient Medications  Medication Sig Dispense Refill  . acetaminophen (TYLENOL) 500 MG tablet Take 1,000 mg by mouth every 6 (six) hours as needed for mild pain.     Marland Kitchen aspirin 81 MG tablet Take 81 mg by mouth every evening.     . Calcium Carb-Cholecalciferol (CALCIUM 600 + D PO) Take 1 tablet by mouth daily.     . celecoxib (CELEBREX) 200 MG capsule Take by mouth 2 (two) times daily.    Marland Kitchen demeclocycline (DECLOMYCIN) 150 MG tablet Take 1 tablet (150 mg total) by mouth daily. 30 tablet 0  . diclofenac Sodium (VOLTAREN) 1 % GEL Apply topically.    Marland Kitchen glucose blood (PRECISION XTRA TEST STRIPS) test strip  USE TO CHECK BLOOD SUGAR ONCE DAILY DX E11.9 100 each 2  . Hypromellose (ARTIFICIAL TEARS OP) Place 1 drop into both eyes 2 (two) times daily.    Marland Kitchen LANCETS ULTRA THIN 30G MISC Patient is to use to check blood glucose in the AM fasting and 2 hours after largest meal. 100 each 6  . levothyroxine (SYNTHROID) 150 MCG tablet Take 1 tablet (150 mcg total) by mouth daily before breakfast. 6 tablet 0  . losartan (COZAAR) 50 MG tablet Take 1 tablet (50 mg total) by mouth daily. 90 tablet 3  . Methylcellulose, Laxative, (CITRUCEL PO) Take by mouth at bedtime.    . Multiple Vitamin (MULTIVITAMIN WITH MINERALS) TABS Take 1 tablet by mouth daily.    . NONFORMULARY OR COMPOUNDED ITEM Glucometer (Precision XTRA- Model #[id number]) 1 each 0  . Omega-3 Fatty Acids (FISH OIL) 1000 MG CAPS Take 1,000 mg by mouth every evening.    . Probiotic Product (RESTORA) CAPS One tab qd (Patient taking differently: Take 1 capsule by mouth every evening. ) 90 capsule 3  . rosuvastatin (CRESTOR) 5 MG tablet Take 1 tablet (5 mg total) by mouth at bedtime. 90 tablet 3  . Tamsulosin HCl (FLOMAX) 0.4 MG CAPS Take 0.4 mg by mouth 2 (two) times daily.     . vitamin B-12 (CYANOCOBALAMIN) 1000 MCG tablet Take 1,000 mcg by mouth daily.     No current facility-administered medications for this visit.    Allergies:   Simvastatin and Oxycodone    ROS:  Please see the history of present illness.   Otherwise, review of systems are positive for none.   All other systems are reviewed and negative.    PHYSICAL EXAM: VS:  BP (!) 116/52   Pulse (!) 54   Ht 5\' 10"  (1.778 m)   Wt 196 lb (88.9 kg)   BMI 28.12 kg/m  , BMI Body mass index is 28.12 kg/m. GENERAL:  Well appearing NECK:  No jugular venous distention, waveform within normal limits, carotid upstroke brisk and symmetric, no bruits, no thyromegaly LUNGS:  Clear to auscultation bilaterally CHEST:  Unremarkable HEART:  PMI not displaced or sustained,S1 and S2 within normal  limits, no S3, no S4, no clicks, no rubs, very soft apical systolic, no diastolic murmurs ABD:  Flat, positive bowel sounds normal in frequency in pitch, no bruits, no rebound, no guarding, no midline pulsatile mass, no hepatomegaly, no splenomegaly EXT:  2 plus pulses throughout, no edema, no cyanosis no clubbing    EKG:  EKG is  ordered today. The ekg ordered today  demonstrates sinus rhythm, rate 54, axis within normal limits, intervals within normal limits, premature ventricular contractions.   Recent Labs: 02/16/2019: Magnesium 2.1 09/12/2019: Hemoglobin 12.5; Platelets 240.0 11/20/2019: ALT 12; BUN 17; Creatinine, Ser 1.02; Potassium 4.7; Sodium 126; TSH 2.22    Lipid Panel    Component Value Date/Time   CHOL 119 02/16/2019 0914   TRIG 117 02/16/2019 0914   HDL 49 02/16/2019 0914   CHOLHDL 2.4 02/16/2019 0914   CHOLHDL 2.6 07/29/2015 0826   VLDL 20 07/29/2015 0826   LDLCALC 49 02/16/2019 0914   LDLDIRECT 121 (H) 11/15/2018 1010   LDLDIRECT 61.6 04/25/2013 1431      Wt Readings from Last 3 Encounters:  11/30/19 196 lb (88.9 kg)  11/20/19 196 lb (88.9 kg)  10/24/19 196 lb (88.9 kg)      Other studies Reviewed: Additional studies/ records that were reviewed today include:  Labs Review of the above records demonstrates: See elsewhere   ASSESSMENT AND PLAN:  SOB:    She has had no new shortness of breath.  No change in therapy.  He had no new symptoms since his negative stress perfusion study.  HTN:  The blood pressure is well controlled.  No change in therapy.   CORONARY CALCIUM:   He has had no symptoms as above.  He will continue with risk reduction.  AORTIC ATHEROSCLEROSIS:    We will continue with primary risk reduction.  DYSLIPIDEMIA:    His LDL was 49.  HDL 49.  No change in therapy.  COVID EDUCATION: He has had his vaccine in his booster.   Current medicines are reviewed at length with the patient today.  The patient does not have concerns regarding  medicines.  The following changes have been made: None  Labs/ tests ordered today include:   Orders Placed This Encounter  Procedures  . EKG 12-Lead     Disposition:   FU with me 12 months   Signed, Minus Breeding, MD  11/30/2019 10:09 AM    [place]

## 2019-11-30 ENCOUNTER — Encounter: Payer: Self-pay | Admitting: Cardiology

## 2019-11-30 ENCOUNTER — Other Ambulatory Visit: Payer: Self-pay

## 2019-11-30 ENCOUNTER — Ambulatory Visit (INDEPENDENT_AMBULATORY_CARE_PROVIDER_SITE_OTHER): Payer: Medicare Other | Admitting: Cardiology

## 2019-11-30 VITALS — BP 116/52 | HR 54 | Ht 70.0 in | Wt 196.0 lb

## 2019-11-30 DIAGNOSIS — I700 Atherosclerosis of aorta: Secondary | ICD-10-CM | POA: Diagnosis not present

## 2019-11-30 DIAGNOSIS — I10 Essential (primary) hypertension: Secondary | ICD-10-CM | POA: Diagnosis not present

## 2019-11-30 DIAGNOSIS — R931 Abnormal findings on diagnostic imaging of heart and coronary circulation: Secondary | ICD-10-CM

## 2019-11-30 DIAGNOSIS — R0602 Shortness of breath: Secondary | ICD-10-CM | POA: Diagnosis not present

## 2019-11-30 DIAGNOSIS — Z7189 Other specified counseling: Secondary | ICD-10-CM

## 2019-11-30 DIAGNOSIS — E785 Hyperlipidemia, unspecified: Secondary | ICD-10-CM | POA: Diagnosis not present

## 2019-11-30 NOTE — Patient Instructions (Signed)
Medication Instructions:   No changes  *If you need a refill on your cardiac medications before your next appointment, please call your pharmacy*   Lab Work: Not needed    Testing/Procedures:  Not needed  Follow-Up: At [facility], you and your health needs are our priority.  As part of our continuing mission to provide you with exceptional heart care, we have created designated Provider Care Teams.  These Care Teams include your primary Cardiologist (physician) and Advanced Practice Providers (APPs -  Physician Assistants and Nurse Practitioners) who all work together to provide you with the care you need, when you need it.     Your next appointment:   12 month(s)  The format for your next appointment:   In Person  Provider:   James Hochrein, MD   

## 2019-12-03 IMAGING — DX DG CHEST 2V
2 series · 2 of 2 positions shown · non-contrast
Comparison: October 03, 2017.

CLINICAL DATA: Hyponatremia.

EXAM:
CHEST - 2 VIEW

[dg chest 2 view (1 of 2)]
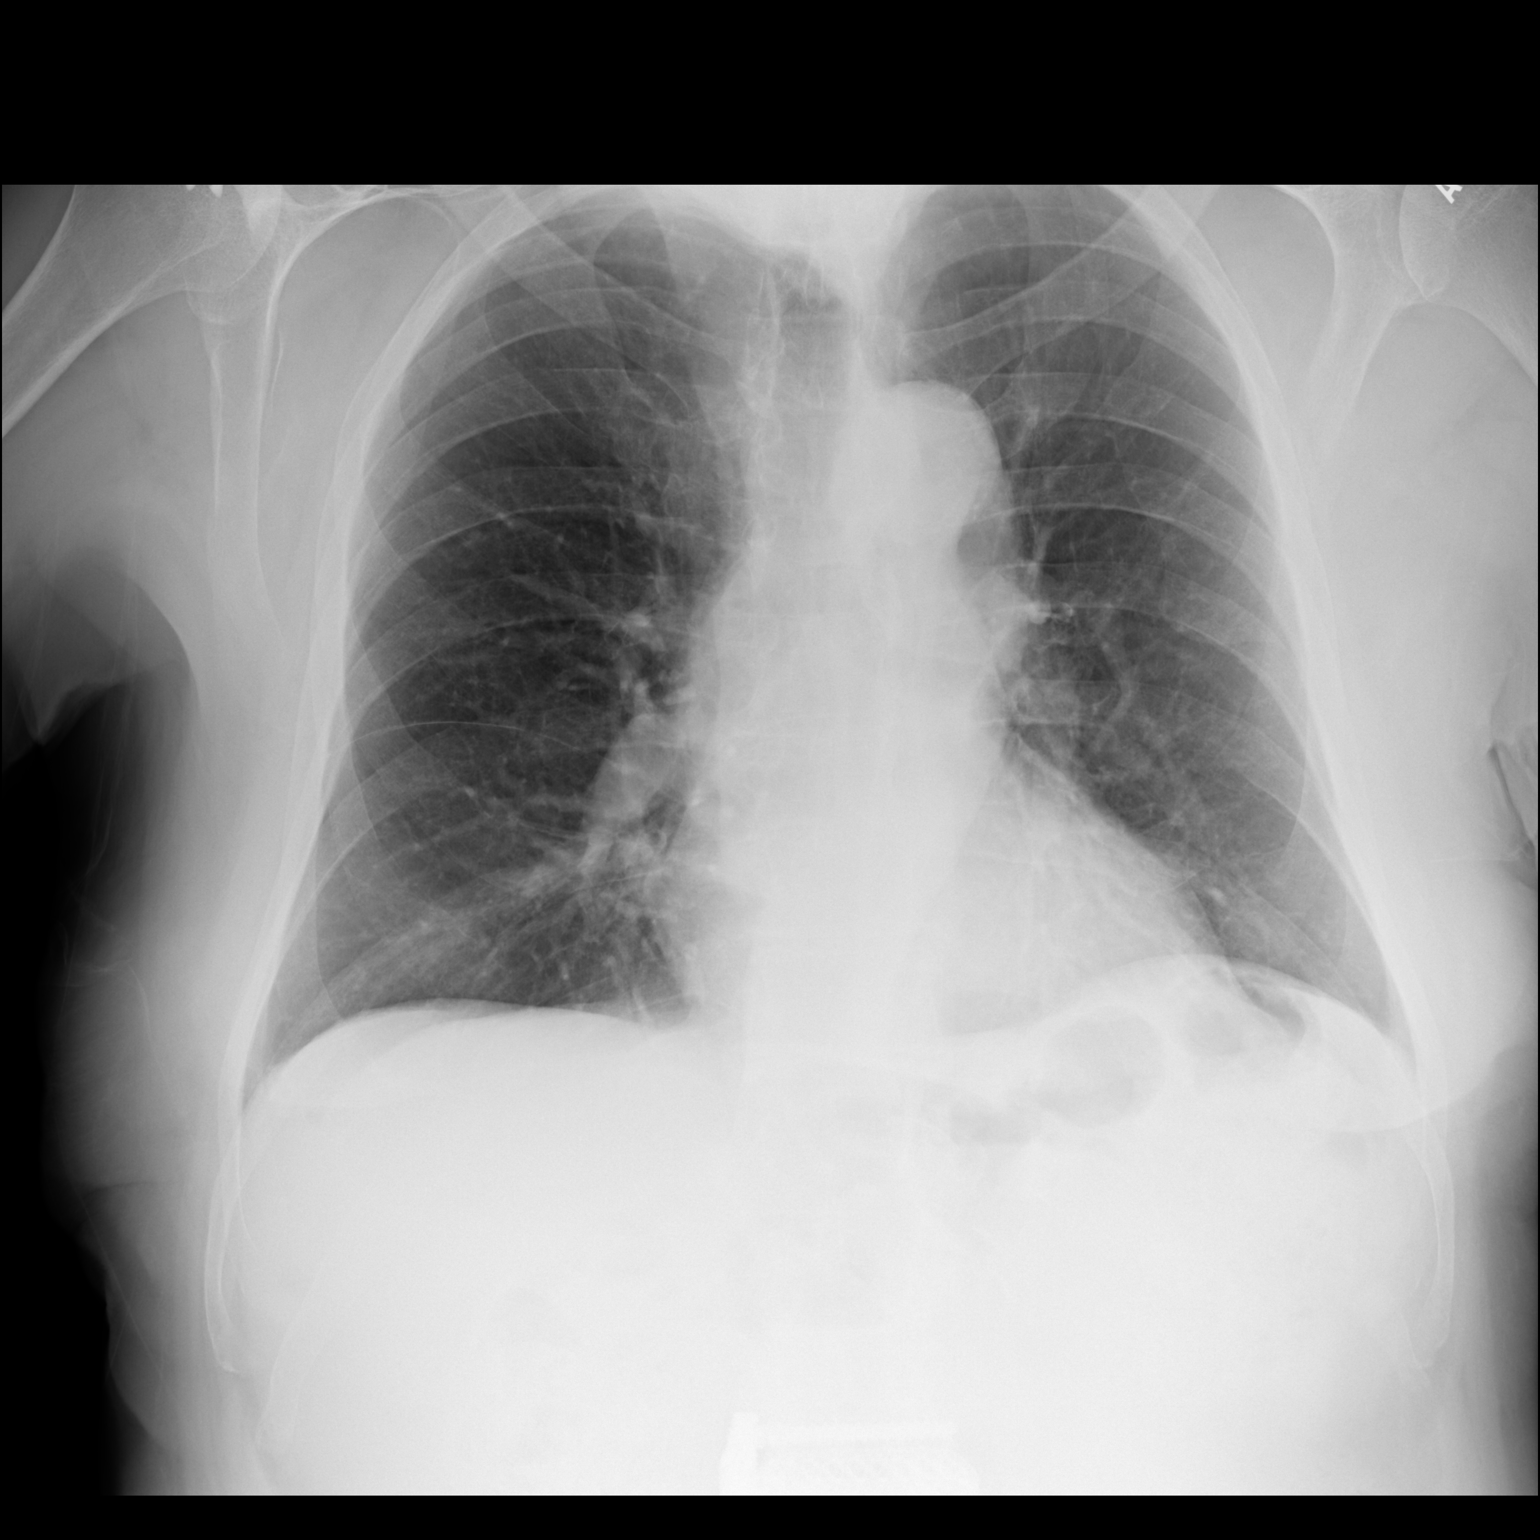

[dg chest 2 view (2 of 2)]
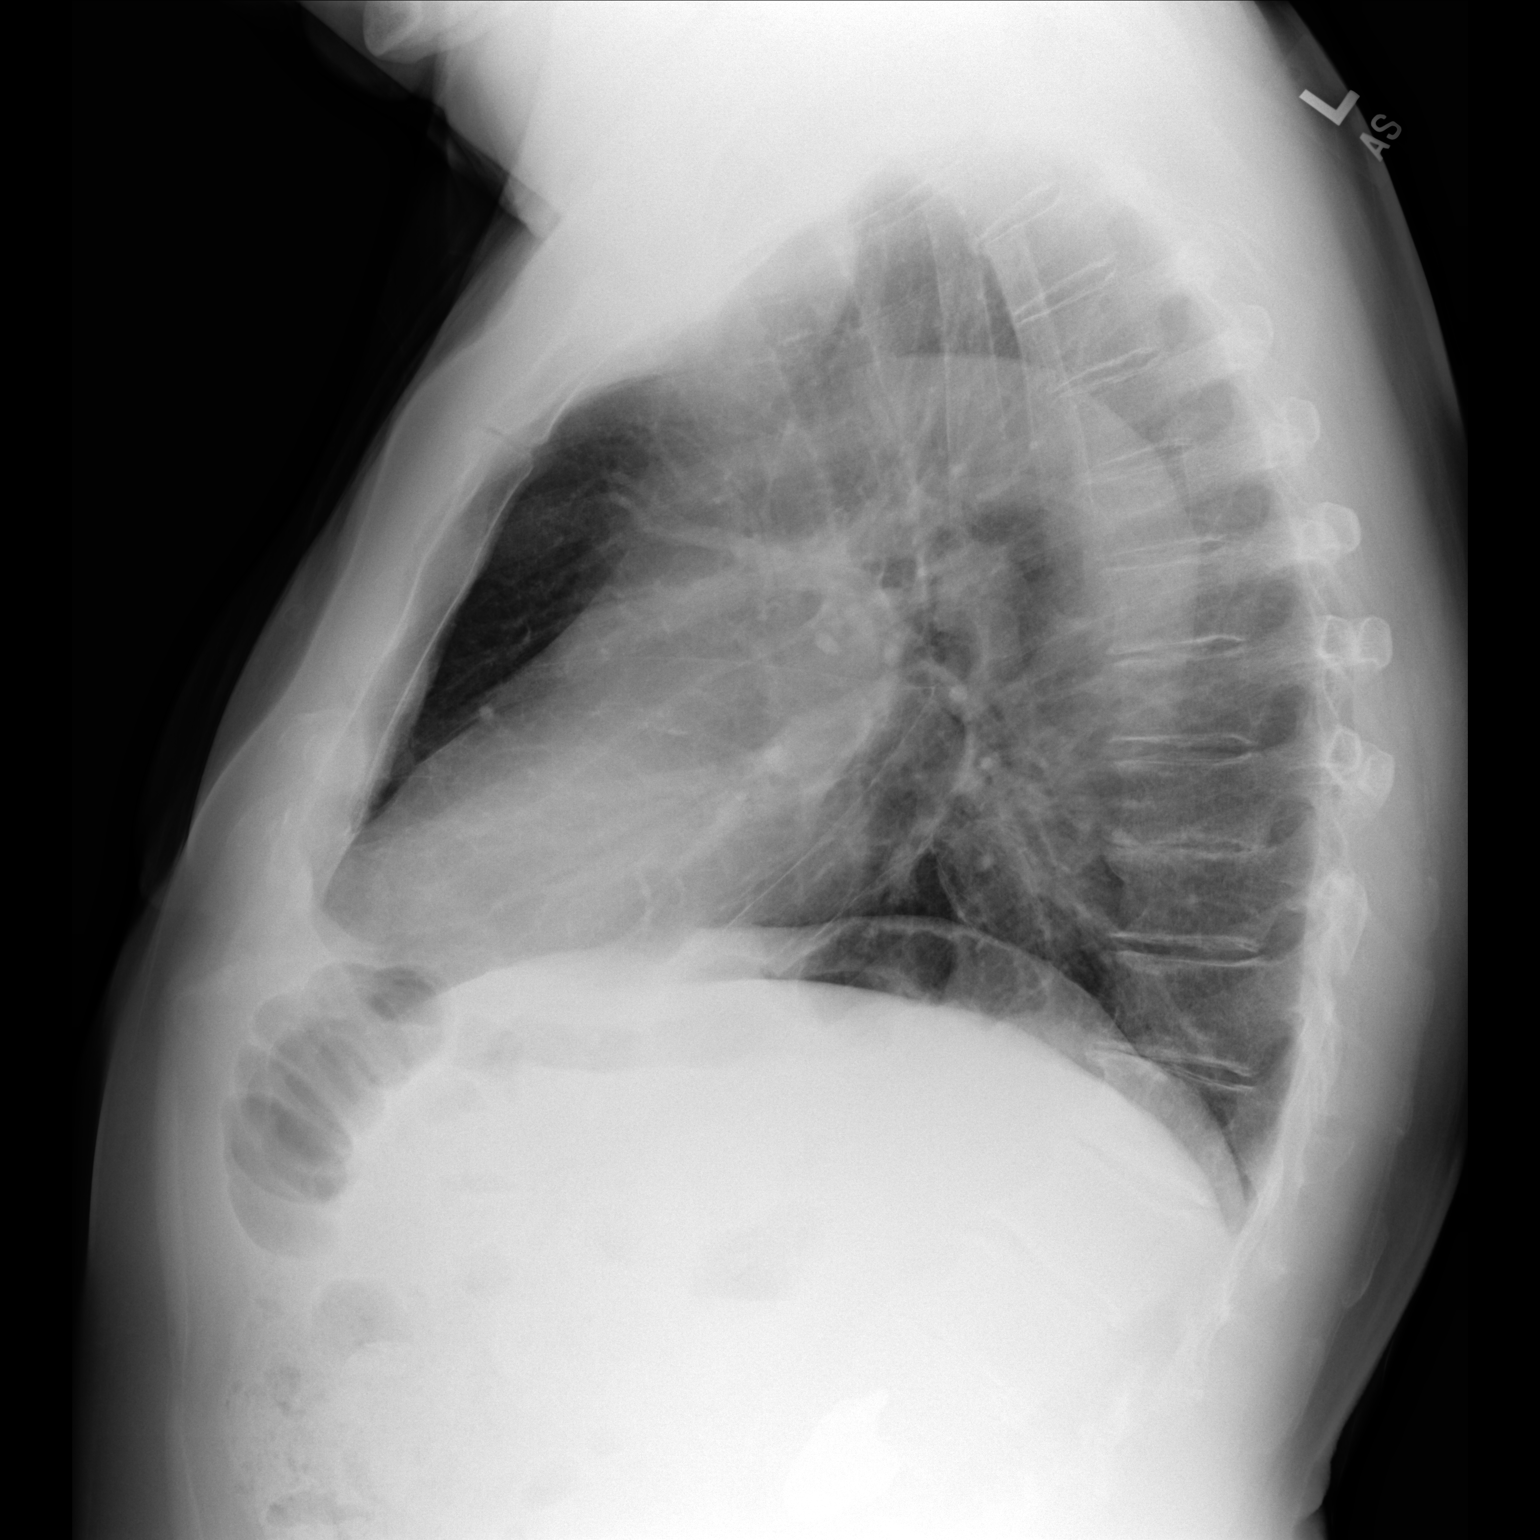

[2 of 2 positions shown; findings below may reference images not displayed]

FINDINGS: The heart size and mediastinal contours are within normal limits.
Both lungs are clear. The visualized skeletal structures are
unremarkable.
IMPRESSION: No active cardiopulmonary disease.

## 2019-12-05 ENCOUNTER — Other Ambulatory Visit: Payer: Self-pay

## 2019-12-05 ENCOUNTER — Ambulatory Visit (INDEPENDENT_AMBULATORY_CARE_PROVIDER_SITE_OTHER): Payer: Medicare Other | Admitting: Endocrinology

## 2019-12-05 ENCOUNTER — Encounter: Payer: Self-pay | Admitting: Endocrinology

## 2019-12-05 VITALS — BP 146/88 | HR 58 | Ht 70.0 in | Wt 195.0 lb

## 2019-12-05 DIAGNOSIS — E063 Autoimmune thyroiditis: Secondary | ICD-10-CM | POA: Diagnosis not present

## 2019-12-05 DIAGNOSIS — E871 Hypo-osmolality and hyponatremia: Secondary | ICD-10-CM

## 2019-12-05 DIAGNOSIS — Z23 Encounter for immunization: Secondary | ICD-10-CM

## 2019-12-05 DIAGNOSIS — R931 Abnormal findings on diagnostic imaging of heart and coronary circulation: Secondary | ICD-10-CM

## 2019-12-05 LAB — COMPREHENSIVE METABOLIC PANEL
ALT: 15 U/L (ref 0–53)
AST: 18 U/L (ref 0–37)
Albumin: 4 g/dL (ref 3.5–5.2)
Alkaline Phosphatase: 62 U/L (ref 39–117)
BUN: 15 mg/dL (ref 6–23)
CO2: 28 mEq/L (ref 19–32)
Calcium: 9.2 mg/dL (ref 8.4–10.5)
Chloride: 95 mEq/L — ABNORMAL LOW (ref 96–112)
Creatinine, Ser: 0.96 mg/dL (ref 0.40–1.50)
GFR: 71.89 mL/min (ref >60.00)
Glucose, Bld: 163 mg/dL — ABNORMAL HIGH (ref 70–99)
Potassium: 4.8 mEq/L (ref 3.5–5.1)
Sodium: 127 mEq/L — ABNORMAL LOW (ref 135–145)
Total Bilirubin: 0.5 mg/dL (ref 0.2–1.2)
Total Protein: 6.7 g/dL (ref 6.0–8.3)

## 2019-12-05 NOTE — Progress Notes (Signed)
Patient ID: Kyle Simon, male   DOB: 1932/12/04, 86 y.o.   MRN: [id number]            Reason for Appointment: Fatigue and follow-up visit    History of Present Illness:   HYPONATREMIA:  Background history:  Serum sodium has been low since at least 2011 and has been as low as 125 in the past More recently lowest reading 127 He has not had any associated symptoms of decreased appetite, nausea or drowsiness Not on any thiazide diuretics or SSRI drugs Has had negative endocrine work-up for cortisol deficiencies  Has a history of drinking a lot of water partly for his constipation  Recent history: His urine osmolality is last high at 443 done in 06/2019, was also high at 442 on initial evaluation Urine sodium was over 90  He was told to cut back on his fluid intake  He has been only using 2 cups of coffee instead of 6/day and also may be a couple more glasses of fluids otherwise  He was told to stop his salt tablets last month because his sodium was down to 126 despite taking these and was having nausea from taking more than 1 regular tablet daily  He feels fairly good, he may take a nap in the afternoon as before No unusual fatigue or nausea  Last sodium was unusually low at 126, previously 129 and gradually declining Because of this he was prescribed demeclocycline but he started this only a week ago since previously was taking clindamycin  Labs pending CT scan of chest is also scheduled for 10/7  Lab Results  Component Value Date   NA 126 (L) 11/20/2019   K 4.7 11/20/2019   CL 94 (L) 11/20/2019   CO2 26 11/20/2019     Hypothyroidism was first diagnosed  10-15 years ago   At the time of diagnosis patient was not having symptoms of  fatigue, cold sensitivity, difficulty concentrating, dry skin, weight gain Likely had only done routine testing and was told that he was hypothyroid and started him on unknown dose of levothyroxine  He did not start feeling any  different when he started taking levothyroxine initially Also usually does not feel any different with dosage adjustments that have been made Apparently his dose was 150 g for some time but this was reduced in 06/2015 when his TSH was low normal Since then he has been taking 150 g 5 days a week and 125 g twice a week Non-insulin initial consultation he was complaining that since spring time his energy level has been not as good and he has not been able to do as much with activities as he used to   He was referred here because his TSH is tending to be high but also his free T4 was relatively higher in 7/18  Recent history: On his initial consultation since his TSH was still relatively high and he was taking the equivalent of about 142 g of levothyroxine daily he was switched to taking 150 g once daily Also he was told to not take his levothyroxine at bedtime but take it before breakfast  He is taking his levothyroxine consistently before breakfast every morning Not taking any multivitamins in the morning at the same time  The last visit he was feeling much more tired which is less recently but he still tends to take a nap in the afternoon and will go to bed earlier  TSH had gone up to 6.6 in July  He was told to take an extra half tablet weekly but he has been doing 1 extra tablet a week With this his last TSH was normal   Patient's weight history is as follows:  Wt Readings from Last 3 Encounters:  12/05/19 195 lb (88.5 kg)  11/30/19 196 lb (88.9 kg)  11/20/19 196 lb (88.9 kg)    Thyroid function results have been as follows:  Lab Results  Component Value Date   TSH 2.22 11/20/2019   TSH 6.58 (H) 09/12/2019   TSH 3.78 07/24/2019   TSH 3.350 02/16/2019   FREET4 1.30 11/20/2019   FREET4 1.12 09/12/2019   FREET4 1.27 07/24/2019   FREET4 1.66 02/16/2019   T3FREE 3.2 12/28/2016   T3FREE 2.7 10/17/2015    Lab Results  Component Value Date   TSH 2.22 11/20/2019   TSH 6.58  (H) 09/12/2019   TSH 3.78 07/24/2019   TSH 3.350 02/16/2019   TSH 3.09 12/11/2018   TSH 4.910 (H) 11/15/2018   TSH 6.950 (H) 08/22/2017   TSH 2.87 04/26/2017   TSH 3.85 12/28/2016     Past Medical History:  Diagnosis Date  . Arthritis    both hands;Dr.Deveshwar  . Constipation due to pain medication   . Diabetes mellitus without complication ([place])    Type II. Uses no medication. Pt controls with diet and weight  . Enlarged prostate   . Frequent urination at night   . GERD (gastroesophageal reflux disease)   . History of noncompliance with medical treatment, presenting hazards to health 11/20/2018  . Hyperlipidemia   . Hyperlipidemia associated with type 2 diabetes mellitus ([place]) 04/19/2007   Qualifier: Diagnosis of  By: Linna Darner MD, Gwyndolyn Saxon    . Hypertension   . Hypothyroidism   . Polio    as a child w/o complications  . Snoring    no sleep apnea  . Statin declined-  11/20/2018   patient understands risks associated with this decision and has been advised to restart by cardiologist as well as myself several times  . Torn meniscus   . Transfusion history 2012   post TKR    Past Surgical History:  Procedure Laterality Date  . ANTERIOR LAT LUMBAR FUSION Right 11/03/2017   Procedure: Right Lumbar three-four Anterolateral lumbar interbody fusion with lateral plate;  Surgeon: Erline Levine, MD;  Location: [facility];  Service: Neurosurgery;  Laterality: Right;  . BACK SURGERY  2011   hemiarthroplasty 01/11/1999 and 6  . CARDIAC CATHETERIZATION  09/2001   negative  . COLONOSCOPY    . EYE SURGERY  2003   R&L cataract removed & IOL- 2003 & 2007,Dr.Jenkins  . INGUINAL HERNIA REPAIR  2002   right, Dr. Truitt Leep  . JOINT REPLACEMENT  2012   left knee replacement  . LIPOMA EXCISION     back  . MENISCECTOMY Bilateral    Dr. Noemi Chapel  . PROSTATE SURGERY  07/2006   for urinary urgency, Dr. Risa Grill  . ROTATOR CUFF REPAIR Right October, 2016  . sinus & throat surgery-1995  1995  .  TONSILLECTOMY    . TOTAL KNEE ARTHROPLASTY  01/11/2011   Procedure: TOTAL KNEE ARTHROPLASTY;  Surgeon: Rudean Haskell, MD;  Location: [facility];  Service: Orthopedics;  Laterality: Left;  . TOTAL KNEE ARTHROPLASTY Right 11/22/2016   Procedure: TOTAL KNEE ARTHROPLASTY;  Surgeon: Vickey Huger, MD;  Location: [facility];  Service: Orthopedics;  Laterality: Right;  . UVULECTOMY  1517   Dr. Erik Obey  . VASECTOMY    .  WRIST SURGERY  1959   fracture- right    Family History  Problem Relation Age of Onset  . Sudden death Father        accident  . Coronary artery disease Mother   . Emphysema Mother   . Diverticulosis Mother   . Thyroid disease Mother   . Asthma Brother   . Bone cancer Brother   . Diabetes Paternal Grandmother   . Sarcoidosis Son   . Thyroid disease Daughter   . Anesthesia problems Neg Hx   . Hypotension Neg Hx   . Malignant hyperthermia Neg Hx   . Pseudochol deficiency Neg Hx   . Colon cancer Neg Hx     Social History:  reports that he quit smoking about 53 years ago. He has a 18.00 pack-year smoking history. He has never used smokeless tobacco. He reports current alcohol use of about 4.0 standard drinks of alcohol per week. He reports that he does not use drugs.  Allergies:  Allergies  Allergen Reactions  . Simvastatin Other (See Comments)    MYALGIAS  . Oxycodone Other (See Comments)    Severe constipation    Allergies as of 12/05/2019      Reactions   Simvastatin Other (See Comments)   MYALGIAS   Oxycodone Other (See Comments)   Severe constipation      Medication List       Accurate as of December 05, 2019  4:04 PM. If you have any questions, ask your nurse or doctor.        acetaminophen 500 MG tablet Commonly known as: TYLENOL Take 1,000 mg by mouth every 6 (six) hours as needed for mild pain.   ARTIFICIAL TEARS OP Place 1 drop into both eyes 2 (two) times daily.   aspirin 81 MG tablet Take 81 mg by mouth every evening.   CALCIUM 600 + D PO Take 1  tablet by mouth daily.   celecoxib 200 MG capsule Commonly known as: CELEBREX Take by mouth 2 (two) times daily.   CITRUCEL PO Take by mouth at bedtime.   demeclocycline 150 MG tablet Commonly known as: DECLOMYCIN Take 1 tablet (150 mg total) by mouth daily.   diclofenac Sodium 1 % Gel Commonly known as: VOLTAREN Apply topically.   Fish Oil 1000 MG Caps Take 1,000 mg by mouth every evening.   Flomax 0.4 MG Caps capsule Generic drug: tamsulosin Take 0.4 mg by mouth 2 (two) times daily.   glucose blood test strip Commonly known as: PRECISION XTRA TEST STRIPS USE TO CHECK BLOOD SUGAR ONCE DAILY DX E11.9   Lancets Ultra Thin 30G Misc Patient is to use to check blood glucose in the AM fasting and 2 hours after largest meal.   levothyroxine 150 MCG tablet Commonly known as: Synthroid Take 1 tablet (150 mcg total) by mouth daily before breakfast.   losartan 50 MG tablet Commonly known as: COZAAR Take 1 tablet (50 mg total) by mouth daily.   multivitamin with minerals Tabs tablet Take 1 tablet by mouth daily.   NONFORMULARY OR COMPOUNDED ITEM Glucometer (Precision XTRA- Model #[id number])   Restora Caps One tab qd What changed:   how much to take  how to take this  when to take this  additional instructions   rosuvastatin 5 MG tablet Commonly known as: Crestor Take 1 tablet (5 mg total) by mouth at bedtime.   vitamin B-12 1000 MCG tablet Commonly known as: CYANOCOBALAMIN Take 1,000 mcg by mouth daily.  Review of Systems   Taking losartan 50 mg for hypertension  BP Readings from Last 3 Encounters:  12/05/19 (!) 146/88  11/30/19 (!) 116/52  11/20/19 (!) 142/78     He has history of prediabetes with last A1c 6.8  Glucose 111 at home recently        Examination:    BP (!) 146/88   Pulse (!) 58   Ht 5\' 10"  (1.778 m)   Wt 195 lb (88.5 kg)   BMI 27.98 kg/m    Assessment:   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 especially  with a very long history  Sodium levels have fluctuated but recently lower This is despite fluid restriction and salt tablets  Last sodium was 126 and he has continued fluid restriction Just starting demeclocycline a week ago   HYPOTHYROIDISM: Last TSH normal with taking 8 tablets a week of the 150 Synthroid  PLAN:   BMP panel and urine osmolality to be done today CT scan pending  We will discuss results, follow-up and recommendations when reports are available  Flu vaccine given  Elayne Snare 12/05/2019, 4:04 PM     Note: This office note was prepared with Dragon voice recognition system technology. Any transcriptional errors that result from this process are unintentional.

## 2019-12-05 NOTE — Progress Notes (Signed)
Sodium stabilizing.  Continue demeclocycline.  Will need to recheck in another 3 weeks.  We can do lab recheck in 3 weeks.  Would like to see the patient back in 6 weeks with labs same day

## 2019-12-06 ENCOUNTER — Ambulatory Visit
Admission: RE | Admit: 2019-12-06 | Discharge: 2019-12-06 | Disposition: A | Discharge status: Home / Self Care | Payer: Medicare Other | Source: Ambulatory Visit | Attending: Endocrinology | Admitting: Endocrinology

## 2019-12-06 DIAGNOSIS — I2510 Atherosclerotic heart disease of native coronary artery without angina pectoris: Secondary | ICD-10-CM | POA: Diagnosis not present

## 2019-12-06 DIAGNOSIS — D499 Neoplasm of unspecified behavior of unspecified site: Secondary | ICD-10-CM

## 2019-12-06 DIAGNOSIS — I712 Thoracic aortic aneurysm, without rupture: Secondary | ICD-10-CM | POA: Diagnosis not present

## 2019-12-06 DIAGNOSIS — Z87891 Personal history of nicotine dependence: Secondary | ICD-10-CM

## 2019-12-06 DIAGNOSIS — J984 Other disorders of lung: Secondary | ICD-10-CM | POA: Diagnosis not present

## 2019-12-06 DIAGNOSIS — R0600 Dyspnea, unspecified: Secondary | ICD-10-CM | POA: Diagnosis not present

## 2019-12-06 LAB — OSMOLALITY, URINE: Osmolality, Ur: 460 mOsmol/kg

## 2019-12-09 NOTE — Progress Notes (Signed)
No tumor seen to explain low sodium.  However mild enlargement of the aorta blood vessel arising from the heart and annual follow-up recommended

## 2019-12-10 ENCOUNTER — Ambulatory Visit: Payer: Medicare Other | Admitting: Endocrinology

## 2019-12-10 ENCOUNTER — Encounter: Payer: Self-pay | Admitting: Family Medicine

## 2019-12-10 ENCOUNTER — Telehealth: Payer: Self-pay

## 2019-12-10 DIAGNOSIS — I712 Thoracic aortic aneurysm, without rupture, unspecified: Secondary | ICD-10-CM | POA: Insufficient documentation

## 2019-12-10 NOTE — Telephone Encounter (Signed)
-----   Message from Elayne Snare, MD sent at 12/09/2019  4:32 PM EDT ----- No tumor seen to explain low sodium.  However mild enlargement of the aorta blood vessel arising from the heart and annual follow-up recommended

## 2019-12-10 NOTE — Telephone Encounter (Signed)
Patient aware of results and recommendations. °

## 2019-12-19 ENCOUNTER — Telehealth: Payer: Self-pay | Admitting: Gastroenterology

## 2019-12-19 NOTE — Telephone Encounter (Signed)
Pls call pt, he will like to update you on his progress.

## 2019-12-19 NOTE — Telephone Encounter (Signed)
Left message on machine to call back  

## 2019-12-20 NOTE — Telephone Encounter (Signed)
The pt was asked to call after he kept a food journal to see if the symptoms he experiences change with food intake. He says that food has not made any difference and he still experiences "wet gas".  Please advise

## 2019-12-21 NOTE — Telephone Encounter (Signed)
The pt has agreed to increase fiber and will call back to make follow up at some point in the future

## 2019-12-21 NOTE — Telephone Encounter (Signed)
I think he is on fiber supplement pills now. He needs to double the dose and arrange ROV with me, next available.  Thanks

## 2019-12-21 NOTE — Telephone Encounter (Signed)
Left message on machine to call back  

## 2019-12-26 ENCOUNTER — Other Ambulatory Visit (INDEPENDENT_AMBULATORY_CARE_PROVIDER_SITE_OTHER): Payer: Medicare Other

## 2019-12-26 ENCOUNTER — Other Ambulatory Visit: Payer: Self-pay

## 2019-12-26 ENCOUNTER — Other Ambulatory Visit: Payer: Self-pay | Admitting: Endocrinology

## 2019-12-26 DIAGNOSIS — E871 Hypo-osmolality and hyponatremia: Secondary | ICD-10-CM

## 2019-12-26 LAB — BASIC METABOLIC PANEL
BUN: 12 mg/dL (ref 6–23)
CO2: 26 mEq/L (ref 19–32)
Calcium: 8.9 mg/dL (ref 8.4–10.5)
Chloride: 97 mEq/L (ref 96–112)
Creatinine, Ser: 0.85 mg/dL (ref 0.40–1.50)
GFR: 79.61 mL/min (ref >60.00)
Glucose, Bld: 117 mg/dL — ABNORMAL HIGH (ref 70–99)
Potassium: 4.6 mEq/L (ref 3.5–5.1)
Sodium: 130 mEq/L — ABNORMAL LOW (ref 135–145)

## 2019-12-27 NOTE — Progress Notes (Signed)
Sodium much better, stay on demeclocycline

## 2020-01-01 DIAGNOSIS — L603 Nail dystrophy: Secondary | ICD-10-CM | POA: Diagnosis not present

## 2020-01-01 DIAGNOSIS — I739 Peripheral vascular disease, unspecified: Secondary | ICD-10-CM | POA: Diagnosis not present

## 2020-01-01 DIAGNOSIS — E1151 Type 2 diabetes mellitus with diabetic peripheral angiopathy without gangrene: Secondary | ICD-10-CM | POA: Diagnosis not present

## 2020-01-01 DIAGNOSIS — L84 Corns and callosities: Secondary | ICD-10-CM | POA: Diagnosis not present

## 2020-01-04 ENCOUNTER — Encounter (INDEPENDENT_AMBULATORY_CARE_PROVIDER_SITE_OTHER): Payer: Medicare Other | Admitting: Ophthalmology

## 2020-01-04 ENCOUNTER — Other Ambulatory Visit: Payer: Self-pay

## 2020-01-04 DIAGNOSIS — E113291 Type 2 diabetes mellitus with mild nonproliferative diabetic retinopathy without macular edema, right eye: Secondary | ICD-10-CM

## 2020-01-04 DIAGNOSIS — E11311 Type 2 diabetes mellitus with unspecified diabetic retinopathy with macular edema: Secondary | ICD-10-CM

## 2020-01-04 DIAGNOSIS — H43813 Vitreous degeneration, bilateral: Secondary | ICD-10-CM | POA: Diagnosis not present

## 2020-01-04 DIAGNOSIS — E113312 Type 2 diabetes mellitus with moderate nonproliferative diabetic retinopathy with macular edema, left eye: Secondary | ICD-10-CM

## 2020-01-18 DIAGNOSIS — M659 Synovitis and tenosynovitis, unspecified: Secondary | ICD-10-CM | POA: Diagnosis not present

## 2020-01-18 DIAGNOSIS — Z96653 Presence of artificial knee joint, bilateral: Secondary | ICD-10-CM | POA: Diagnosis not present

## 2020-01-28 ENCOUNTER — Ambulatory Visit (INDEPENDENT_AMBULATORY_CARE_PROVIDER_SITE_OTHER): Payer: Medicare Other | Admitting: Endocrinology

## 2020-01-28 ENCOUNTER — Other Ambulatory Visit: Payer: Self-pay

## 2020-01-28 ENCOUNTER — Encounter: Payer: Self-pay | Admitting: Endocrinology

## 2020-01-28 VITALS — BP 138/80 | HR 53 | Ht 70.0 in | Wt 194.2 lb

## 2020-01-28 DIAGNOSIS — E871 Hypo-osmolality and hyponatremia: Secondary | ICD-10-CM | POA: Diagnosis not present

## 2020-01-28 DIAGNOSIS — E063 Autoimmune thyroiditis: Secondary | ICD-10-CM

## 2020-01-28 DIAGNOSIS — R931 Abnormal findings on diagnostic imaging of heart and coronary circulation: Secondary | ICD-10-CM | POA: Diagnosis not present

## 2020-01-28 LAB — BASIC METABOLIC PANEL
BUN: 25 mg/dL — ABNORMAL HIGH (ref 6–23)
CO2: 26 mEq/L (ref 19–32)
Calcium: 9.1 mg/dL (ref 8.4–10.5)
Chloride: 94 mEq/L — ABNORMAL LOW (ref 96–112)
Creatinine, Ser: 1.03 mg/dL (ref 0.40–1.50)
GFR: 66.51 mL/min (ref >60.00)
Glucose, Bld: 106 mg/dL — ABNORMAL HIGH (ref 70–99)
Potassium: 5.3 mEq/L — ABNORMAL HIGH (ref 3.5–5.1)
Sodium: 126 mEq/L — ABNORMAL LOW (ref 135–145)

## 2020-01-28 LAB — TSH: TSH: 1.51 u[IU]/mL (ref 0.35–4.50)

## 2020-01-28 NOTE — Progress Notes (Signed)
Patient ID: Kyle Simon, male   DOB: 1932-09-09, 86 y.o.   MRN: [id number]            Reason for Appointment: Endocrinology follow-up visit    History of Present Illness:   HYPONATREMIA:  Background history:  Serum sodium has been low since at least 2011 and has been as low as 125 in the past More recently lowest reading 127 He has not had any associated symptoms of decreased appetite, nausea or drowsiness Not on any thiazide diuretics or SSRI drugs Has had negative endocrine work-up for cortisol deficiencies  Has a history of drinking a lot of water partly for his constipation  Recent history: His urine osmolality is last high at 460 done in 10/21, was also high at 442 on initial evaluation Urine sodium was over 90 previously  He was told to cut back on his fluid intake especially coffee He had reduced intake to 2 cups of coffee instead of 6/day  Even with a trial of salt tablets his sodium was down to 126 in 9/21 Also was having nausea from taking more than 1 regular tablet daily  He feels fairly good, he will take a nap in the afternoon as before No unusual fatigue or change in appetite  Last sodium was significantly better at 130 compared to 127 after starting DEMECLOCYCLINE 150 mg daily  He was prescribed demeclocycline around in September 2021   Labs pending CT scan of chest did not show any tumor  Lab Results  Component Value Date   NA 130 (L) 12/26/2019   K 4.6 12/26/2019   CL 97 12/26/2019   CO2 26 12/26/2019     Hypothyroidism was first diagnosed  around 2005 years ago   At the time of diagnosis patient was not having symptoms of  fatigue, cold sensitivity, difficulty concentrating, dry skin, weight gain Likely had only done routine testing and was told that he was hypothyroid and started him on unknown dose of levothyroxine  He did not start feeling any different when he started taking levothyroxine initially Also usually does not feel any  different with dosage adjustments that have been made Apparently his dose was 150 g for some time but this was reduced in 06/2015 when his TSH was low normal Since then he has been taking 150 g 5 days a week and 125 g twice a week Non-insulin initial consultation he was complaining that since spring time his energy level has been not as good and he has not been able to do as much with activities as he used to   He was referred here because his TSH is tending to be high but also his free T4 was relatively higher in 7/18  Recent history: On his initial consultation since his TSH was still relatively high and he was taking the equivalent of about 142 g of levothyroxine daily he was switched to taking 150 g once daily Also he was told to not take his levothyroxine at bedtime but take it before breakfast  He is taking his levothyroxine consistently before breakfast every morning Not taking any multivitamins in the morning at the same time  The last visit he was feeling much more tired which is less recently but he still tends to take a nap in the afternoon and will go to bed earlier  TSH had gone up to 6.6 in July  With taking 1 extra tablet per week of the levothyroxine 150 mcg his last TSH was normal  Patient's weight history is as follows:  Wt Readings from Last 3 Encounters:  01/28/20 194 lb 3.2 oz (88.1 kg)  12/05/19 195 lb (88.5 kg)  11/30/19 196 lb (88.9 kg)    Thyroid function results have been as follows:  Lab Results  Component Value Date   TSH 2.22 11/20/2019   TSH 6.58 (H) 09/12/2019   TSH 3.78 07/24/2019   TSH 3.350 02/16/2019   FREET4 1.30 11/20/2019   FREET4 1.12 09/12/2019   FREET4 1.27 07/24/2019   FREET4 1.66 02/16/2019   T3FREE 3.2 12/28/2016   T3FREE 2.7 10/17/2015    Lab Results  Component Value Date   TSH 2.22 11/20/2019   TSH 6.58 (H) 09/12/2019   TSH 3.78 07/24/2019   TSH 3.350 02/16/2019   TSH 3.09 12/11/2018   TSH 4.910 (H) 11/15/2018    TSH 6.950 (H) 08/22/2017   TSH 2.87 04/26/2017   TSH 3.85 12/28/2016     Past Medical History:  Diagnosis Date  . Arthritis    both hands;Dr.Deveshwar  . Constipation due to pain medication   . Diabetes mellitus without complication ([place])    Type II. Uses no medication. Pt controls with diet and weight  . Enlarged prostate   . Frequent urination at night   . GERD (gastroesophageal reflux disease)   . History of noncompliance with medical treatment, presenting hazards to health 11/20/2018  . Hyperlipidemia   . Hyperlipidemia associated with type 2 diabetes mellitus ([place]) 04/19/2007   Qualifier: Diagnosis of  By: Linna Darner MD, Gwyndolyn Saxon    . Hypertension   . Hypothyroidism   . Polio    as a child w/o complications  . Snoring    no sleep apnea  . Statin declined-  11/20/2018   patient understands risks associated with this decision and has been advised to restart by cardiologist as well as myself several times  . Torn meniscus   . Transfusion history 2012   post TKR    Past Surgical History:  Procedure Laterality Date  . ANTERIOR LAT LUMBAR FUSION Right 11/03/2017   Procedure: Right Lumbar three-four Anterolateral lumbar interbody fusion with lateral plate;  Surgeon: Erline Levine, MD;  Location: [place];  Service: Neurosurgery;  Laterality: Right;  . BACK SURGERY  2011   hemiarthroplasty 01/11/1999 and 6  . CARDIAC CATHETERIZATION  09/2001   negative  . COLONOSCOPY    . EYE SURGERY  2003   R&L cataract removed & IOL- 2003 & 2007,Dr.Jenkins  . INGUINAL HERNIA REPAIR  2002   right, Dr. Truitt Leep  . JOINT REPLACEMENT  2012   left knee replacement  . LIPOMA EXCISION     back  . MENISCECTOMY Bilateral    Dr. Noemi Chapel  . PROSTATE SURGERY  07/2006   for urinary urgency, Dr. Risa Grill  . ROTATOR CUFF REPAIR Right October, 2016  . sinus & throat surgery-1995  1995  . TONSILLECTOMY    . TOTAL KNEE ARTHROPLASTY  01/11/2011   Procedure: TOTAL KNEE ARTHROPLASTY;  Surgeon: Rudean Haskell,  MD;  Location: [place];  Service: Orthopedics;  Laterality: Left;  . TOTAL KNEE ARTHROPLASTY Right 11/22/2016   Procedure: TOTAL KNEE ARTHROPLASTY;  Surgeon: Vickey Huger, MD;  Location: [place];  Service: Orthopedics;  Laterality: Right;  . UVULECTOMY  0865   Dr. Erik Obey  . VASECTOMY    . WRIST SURGERY  1959   fracture- right    Family History  Problem Relation Age of Onset  . Sudden death Father  accident  . Coronary artery disease Mother   . Emphysema Mother   . Diverticulosis Mother   . Thyroid disease Mother   . Asthma Brother   . Bone cancer Brother   . Diabetes Paternal Grandmother   . Sarcoidosis Son   . Thyroid disease Daughter   . Anesthesia problems Neg Hx   . Hypotension Neg Hx   . Malignant hyperthermia Neg Hx   . Pseudochol deficiency Neg Hx   . Colon cancer Neg Hx     Social History:  reports that he quit smoking about 53 years ago. He has a 18.00 pack-year smoking history. He has never used smokeless tobacco. He reports current alcohol use of about 4.0 standard drinks of alcohol per week. He reports that he does not use drugs.  Allergies:  Allergies  Allergen Reactions  . Simvastatin Other (See Comments)    MYALGIAS  . Oxycodone Other (See Comments)    Severe constipation    Allergies as of 01/28/2020      Reactions   Simvastatin Other (See Comments)   MYALGIAS   Oxycodone Other (See Comments)   Severe constipation      Medication List       Accurate as of January 28, 2020  1:13 PM. If you have any questions, ask your nurse or doctor.        acetaminophen 500 MG tablet Commonly known as: TYLENOL Take 1,000 mg by mouth every 6 (six) hours as needed for mild pain. Takes 2 tablets nightly   ARTIFICIAL TEARS OP Place 1 drop into both eyes 2 (two) times daily.   aspirin 81 MG tablet Take 81 mg by mouth every evening.   CALCIUM 600 + D PO Take 1 tablet by mouth daily.   celecoxib 200 MG capsule Commonly known as: CELEBREX Take by  mouth 2 (two) times daily.   CITRUCEL PO Take by mouth at bedtime.   demeclocycline 150 MG tablet Commonly known as: DECLOMYCIN Take 1 tablet (150 mg total) by mouth daily.   diclofenac Sodium 1 % Gel Commonly known as: VOLTAREN Apply topically.   Fish Oil 1000 MG Caps Take 1,000 mg by mouth every evening.   Flomax 0.4 MG Caps capsule Generic drug: tamsulosin Take 0.4 mg by mouth 2 (two) times daily.   glucose blood test strip Commonly known as: PRECISION XTRA TEST STRIPS USE TO CHECK BLOOD SUGAR ONCE DAILY DX E11.9   Lancets Ultra Thin 30G Misc Patient is to use to check blood glucose in the AM fasting and 2 hours after largest meal.   levothyroxine 150 MCG tablet Commonly known as: Synthroid Take 1 tablet (150 mcg total) by mouth daily before breakfast.   losartan 50 MG tablet Commonly known as: COZAAR Take 1 tablet (50 mg total) by mouth daily.   multivitamin with minerals Tabs tablet Take 1 tablet by mouth daily.   NONFORMULARY OR COMPOUNDED ITEM Glucometer (Precision XTRA- Model #[id number])   Restora Caps One tab qd What changed:   how much to take  how to take this  when to take this  additional instructions   rosuvastatin 5 MG tablet Commonly known as: Crestor Take 1 tablet (5 mg total) by mouth at bedtime.   vitamin B-12 1000 MCG tablet Commonly known as: CYANOCOBALAMIN Take 1,000 mcg by mouth daily.        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 and  CT scan did not show tumor Most recently his sodium has improved to 130 with demeclocycline 150 mg daily Previously has been as low as 126 despite fluid restriction  Since he is usually asymptomatic we will need to recheck her sodium today   HYPOTHYROIDISM: Last TSH normal with taking 8 tablets a week of the 150 Synthroid  History of abnormal glucose levels: He says his glucose is 102 recently at home and will continue to follow with PCP  PLAN:   Sodium urine and serum levels and urine osmolality to be done today Also recheck thyroid levels Does need to still continue fluid restriction  Follow-up to be decided   Elayne Snare 01/28/2020, 1:13 PM     Note: This office note was prepared with Dragon voice recognition system technology. Any transcriptional errors that result from this process are unintentional.

## 2020-01-29 ENCOUNTER — Other Ambulatory Visit: Payer: Self-pay | Admitting: *Deleted

## 2020-01-29 DIAGNOSIS — Z87891 Personal history of nicotine dependence: Secondary | ICD-10-CM

## 2020-01-29 LAB — T4, FREE: Free T4: 1.48 ng/dL (ref 0.60–1.60)

## 2020-01-29 MED ORDER — DEMECLOCYCLINE HCL 150 MG PO TABS: 150.0000 mg | ORAL_TABLET | Freq: Every day | ORAL | 1 refills | Status: DC

## 2020-01-29 NOTE — Progress Notes (Signed)
Sodium has gone down again.  Likely need to cut back on fluid intake to no more than 4 to 5 cups of any kind of liquids a day.  Also increase demeclocycline to twice a day, will need new prescription.Potassium level is high possibly from combination of losartan and Celebrex.  This will need to be addressed by Dr. Maryelizabeth Kaufmann follow-up sodium testing in another 3 to 4 weeks

## 2020-01-31 ENCOUNTER — Telehealth: Payer: Self-pay | Admitting: Family Medicine

## 2020-01-31 LAB — OSMOLALITY, URINE: Osmolality, Ur: 426 mosm/kg

## 2020-01-31 LAB — SODIUM, URINE, RANDOM: Sodium, Ur: 81 mmol/L

## 2020-01-31 NOTE — Telephone Encounter (Signed)
-----   Message from Lesleigh Noe, MD sent at 01/30/2020  8:41 AM EST ----- Regarding: f/u appt Can you have him schedule and office visit with me? His potassium was high with endocrinology and we need to discuss options for treatment.

## 2020-01-31 NOTE — Telephone Encounter (Signed)
Called patient to schedule follow up. LVM to call back to schedule.

## 2020-02-05 ENCOUNTER — Encounter: Payer: Self-pay | Admitting: Family Medicine

## 2020-02-05 ENCOUNTER — Other Ambulatory Visit: Payer: Self-pay

## 2020-02-05 ENCOUNTER — Ambulatory Visit (INDEPENDENT_AMBULATORY_CARE_PROVIDER_SITE_OTHER): Payer: Medicare Other | Admitting: Family Medicine

## 2020-02-05 VITALS — BP 128/60 | HR 73 | Temp 97.2°F | Ht 70.0 in | Wt 193.0 lb

## 2020-02-05 DIAGNOSIS — R0602 Shortness of breath: Secondary | ICD-10-CM | POA: Diagnosis not present

## 2020-02-05 DIAGNOSIS — E1169 Type 2 diabetes mellitus with other specified complication: Secondary | ICD-10-CM

## 2020-02-05 DIAGNOSIS — E875 Hyperkalemia: Secondary | ICD-10-CM | POA: Insufficient documentation

## 2020-02-05 DIAGNOSIS — D509 Iron deficiency anemia, unspecified: Secondary | ICD-10-CM

## 2020-02-05 DIAGNOSIS — E119 Type 2 diabetes mellitus without complications: Secondary | ICD-10-CM | POA: Diagnosis not present

## 2020-02-05 DIAGNOSIS — E222 Syndrome of inappropriate secretion of antidiuretic hormone: Secondary | ICD-10-CM | POA: Diagnosis not present

## 2020-02-05 LAB — CBC WITH DIFFERENTIAL/PLATELET
Basophils Absolute: 0 10*3/uL (ref 0.0–0.1)
Basophils Relative: 0.4 % (ref 0.0–3.0)
Eosinophils Absolute: 0.1 10*3/uL (ref 0.0–0.7)
Eosinophils Relative: 1.1 % (ref 0.0–5.0)
HCT: 37.9 % — ABNORMAL LOW (ref 39.0–52.0)
Hemoglobin: 12.7 g/dL — ABNORMAL LOW (ref 13.0–17.0)
Lymphocytes Relative: 19.4 % (ref 12.0–46.0)
Lymphs Abs: 1.4 10*3/uL (ref 0.7–4.0)
MCHC: 33.6 g/dL (ref 30.0–36.0)
MCV: 91.4 fl (ref 78.0–100.0)
Monocytes Absolute: 0.7 10*3/uL (ref 0.1–1.0)
Monocytes Relative: 10 % (ref 3.0–12.0)
Neutro Abs: 5 10*3/uL (ref 1.4–7.7)
Neutrophils Relative %: 69.1 % (ref 43.0–77.0)
Platelets: 214 10*3/uL (ref 150.0–400.0)
RBC: 4.14 Mil/uL — ABNORMAL LOW (ref 4.22–5.81)
RDW: 12.1 % (ref 11.5–15.5)
WBC: 7.2 10*3/uL (ref 4.0–10.5)

## 2020-02-05 LAB — BASIC METABOLIC PANEL
BUN: 14 mg/dL (ref 6–23)
CO2: 27 mEq/L (ref 19–32)
Calcium: 8.9 mg/dL (ref 8.4–10.5)
Chloride: 94 mEq/L — ABNORMAL LOW (ref 96–112)
Creatinine, Ser: 0.96 mg/dL (ref 0.40–1.50)
GFR: 72.36 mL/min (ref >60.00)
Glucose, Bld: 98 mg/dL (ref 70–99)
Potassium: 5.4 mEq/L — ABNORMAL HIGH (ref 3.5–5.1)
Sodium: 126 mEq/L — ABNORMAL LOW (ref 135–145)

## 2020-02-05 LAB — IBC PANEL
Iron: 100 ug/dL (ref 42–165)
Saturation Ratios: 26.7 % (ref 20.0–50.0)
Transferrin: 268 mg/dL (ref 212.0–360.0)

## 2020-02-05 LAB — FERRITIN: Ferritin: 71.8 ng/mL (ref 22.0–322.0)

## 2020-02-05 MED ORDER — BLOOD GLUCOSE MONITOR KIT: 1.0000 | PACK | 0 refills | Status: DC

## 2020-02-05 NOTE — Assessment & Plan Note (Signed)
Suspect this is due to his recent start of Celebrex. Still elevated on repeat. Will advise stopping medication and plan to re-check in 1-2 weeks.

## 2020-02-05 NOTE — Assessment & Plan Note (Signed)
Appreciate endocrine support.

## 2020-02-05 NOTE — Assessment & Plan Note (Signed)
Mild anemia with normal iron studies. Advised avoiding donating blood and iron rich foods but no need for replacement at this time. Will continue to monitor

## 2020-02-05 NOTE — Progress Notes (Signed)
Subjective:     Kyle Simon is a 86 y.o. male presenting for Follow-up (about meds from last OV with Dr. Dwyane Dee and needs new glucometer w/ supplies) and Shortness of Breath ("runs out of gas by the afternoon" but fine in the AM)     HPI  #Hyperkalemia - on celebrex for his knees - he started this 2 months ago  - getting significant knee pain - Dr Lorre Nick with [facility]  - notes some pain improvement  - does feel that the knee drainage has been more beneficial  - is getting cortisone shots   #SOB - with exertion - in the morning does well - working outside, in the garage - gets tired in the afternoon - will rest and does well - wife said to mention this - no swelling in the leg - no sob overnight - no cp with activity  No blood in stool or dark stools No heart palpations  Review of Systems   Social History   Tobacco Use  Smoking Status Former Smoker  . Packs/day: 1.00  . Years: 18.00  . Pack years: 18.00  . Quit date: 03/01/1966  . Years since quitting: 53.9  Smokeless Tobacco Never Used        Objective:    BP Readings from Last 3 Encounters:  02/05/20 128/60  01/28/20 138/80  12/05/19 (!) 146/88   Wt Readings from Last 3 Encounters:  02/05/20 193 lb (87.5 kg)  01/28/20 194 lb 3.2 oz (88.1 kg)  12/05/19 195 lb (88.5 kg)    BP 128/60   Pulse 73   Temp (!) 97.2 F (36.2 C) (Temporal)   Ht _0  (1.778 m)   Wt 193 lb (87.5 kg)   BMI 27.69 kg/m    Physical Exam Constitutional:      Appearance: Normal appearance. He is not ill-appearing or diaphoretic.  HENT:     Right Ear: External ear normal.     Left Ear: External ear normal.  Eyes:     General: No scleral icterus.    Extraocular Movements: Extraocular movements intact.     Conjunctiva/sclera: Conjunctivae normal.  Cardiovascular:     Rate and Rhythm: Normal rate and regular rhythm.     Heart sounds: No murmur heard.   Pulmonary:     Effort: Pulmonary effort is normal. No  respiratory distress.     Breath sounds: Normal breath sounds. No wheezing.  Musculoskeletal:     Cervical back: Neck supple.  Skin:    General: Skin is warm and dry.  Neurological:     Mental Status: He is alert. Mental status is at baseline.  Psychiatric:        Mood and Affect: Mood normal.           Assessment & Plan:   Problem List Items Addressed This Visit      Endocrine   Type 2 diabetes mellitus with other specified complication ([place]) - Primary    Lab Results  Component Value Date   HGBA1C 6.8 (H) 11/20/2019   Diet controlled. Complicated by HLD and HTN      Relevant Medications   blood glucose meter kit and supplies KIT   SIADH (syndrome of inappropriate ADH production) ([facility])    Appreciate endocrine support.         Other   Anemia, unspecified (Chronic)    Mild anemia with normal iron studies. Advised avoiding donating blood and iron rich foods but no need for replacement  at this time. Will continue to monitor      Relevant Orders   CBC with Differential (Completed)   Ferritin (Completed)   IBC panel (Completed)   Hyperkalemia    Suspect this is due to his recent start of Celebrex. Still elevated on repeat. Will advise stopping medication and plan to re-check in 1-2 weeks.       Relevant Orders   Basic metabolic panel (Completed)   Basic metabolic panel   Shortness of breath    Advised watch and wait. Only happening in the afternoon and no cp. Will continue to monitor.           Return in about 2 weeks (around 02/19/2020) for labs.  Lesleigh Noe, MD  This visit occurred during the SARS-CoV-2 public health emergency.  Safety protocols were in place, including screening questions prior to the visit, additional usage of staff PPE, and extensive cleaning of exam room while observing appropriate contact time as indicated for disinfecting solutions.

## 2020-02-05 NOTE — Assessment & Plan Note (Addendum)
Lab Results  Component Value Date   HGBA1C 6.8 (H) 11/20/2019   Diet controlled. Complicated by HLD and HTN

## 2020-02-05 NOTE — Assessment & Plan Note (Signed)
Advised watch and wait. Only happening in the afternoon and no cp. Will continue to monitor.

## 2020-02-05 NOTE — Patient Instructions (Signed)
We will recheck the potassium   If it is still high - I would recommend that you stop the Celebrex and we check in on pain and repeat potassium next week

## 2020-02-06 ENCOUNTER — Encounter (INDEPENDENT_AMBULATORY_CARE_PROVIDER_SITE_OTHER): Payer: Medicare Other | Admitting: Ophthalmology

## 2020-02-06 DIAGNOSIS — I10 Essential (primary) hypertension: Secondary | ICD-10-CM | POA: Diagnosis not present

## 2020-02-06 DIAGNOSIS — H35033 Hypertensive retinopathy, bilateral: Secondary | ICD-10-CM

## 2020-02-06 DIAGNOSIS — E113312 Type 2 diabetes mellitus with moderate nonproliferative diabetic retinopathy with macular edema, left eye: Secondary | ICD-10-CM | POA: Diagnosis not present

## 2020-02-06 DIAGNOSIS — E11311 Type 2 diabetes mellitus with unspecified diabetic retinopathy with macular edema: Secondary | ICD-10-CM | POA: Diagnosis not present

## 2020-02-06 DIAGNOSIS — E113291 Type 2 diabetes mellitus with mild nonproliferative diabetic retinopathy without macular edema, right eye: Secondary | ICD-10-CM

## 2020-02-06 DIAGNOSIS — H43813 Vitreous degeneration, bilateral: Secondary | ICD-10-CM

## 2020-02-07 ENCOUNTER — Encounter (INDEPENDENT_AMBULATORY_CARE_PROVIDER_SITE_OTHER): Payer: Medicare Other | Admitting: Ophthalmology

## 2020-02-18 ENCOUNTER — Ambulatory Visit: Payer: Medicare Other | Admitting: Endocrinology

## 2020-02-27 DIAGNOSIS — Z96653 Presence of artificial knee joint, bilateral: Secondary | ICD-10-CM | POA: Diagnosis not present

## 2020-03-03 ENCOUNTER — Other Ambulatory Visit: Payer: Medicare Other

## 2020-03-03 DIAGNOSIS — Z20822 Contact with and (suspected) exposure to covid-19: Secondary | ICD-10-CM

## 2020-03-04 ENCOUNTER — Telehealth: Payer: Self-pay | Admitting: *Deleted

## 2020-03-04 LAB — NOVEL CORONAVIRUS, NAA: SARS-CoV-2, NAA: NOT DETECTED

## 2020-03-04 LAB — SARS-COV-2, NAA 2 DAY TAT

## 2020-03-04 NOTE — Telephone Encounter (Signed)
Patient left a voicemail stating that he has received a positive covid test results. Patient stated that he would like to be prescribe whatever medication is being given to covid patients at this time. Tried to call patient back and got his voicemail. Left message for patient to call the office back.

## 2020-03-04 NOTE — Telephone Encounter (Signed)
Please call pt  Unfortunately the medication for outpatient treatment of covid is in extremely short supply.   [facility] is only able to offer it to unvaccinated patients since they are at higher risk.   Since Nadine Counts is at slightly higher risk than June I will refer to monoclonal antibody treatment as he may get selected. If they have medication they will call.

## 2020-03-04 NOTE — Telephone Encounter (Addendum)
Patient called back and stated that he got tested because his wife was going to travel to [place] and she wanted to get a test before traveling. Patient stated the only symptom he has is a dry cough that started 3-4 days ago. Patient denies any other symptoms. Patient stated that his daughter is a Engineer, civil (consulting) at [employer] and suggested that he call his PCP and see about getting an infusion. Patient was given ER precautions and he verbalized understanding.Patient stated that he is in quarantine. Patient stated that he has been vaccinated and has had his booster shot.

## 2020-03-05 NOTE — Telephone Encounter (Signed)
Mychart message sent to pt relaying information from Dr. Cody.  

## 2020-03-06 ENCOUNTER — Other Ambulatory Visit (INDEPENDENT_AMBULATORY_CARE_PROVIDER_SITE_OTHER): Payer: Medicare Other

## 2020-03-06 ENCOUNTER — Other Ambulatory Visit: Payer: Self-pay

## 2020-03-06 DIAGNOSIS — E875 Hyperkalemia: Secondary | ICD-10-CM

## 2020-03-06 LAB — BASIC METABOLIC PANEL
BUN: 17 mg/dL (ref 6–23)
CO2: 27 mEq/L (ref 19–32)
Calcium: 9.2 mg/dL (ref 8.4–10.5)
Chloride: 95 mEq/L — ABNORMAL LOW (ref 96–112)
Creatinine, Ser: 0.93 mg/dL (ref 0.40–1.50)
GFR: 75.13 mL/min (ref >60.00)
Glucose, Bld: 164 mg/dL — ABNORMAL HIGH (ref 70–99)
Potassium: 4.6 mEq/L (ref 3.5–5.1)
Sodium: 128 mEq/L — ABNORMAL LOW (ref 135–145)

## 2020-03-07 ENCOUNTER — Encounter (INDEPENDENT_AMBULATORY_CARE_PROVIDER_SITE_OTHER): Payer: Medicare Other | Admitting: Ophthalmology

## 2020-03-21 ENCOUNTER — Encounter (INDEPENDENT_AMBULATORY_CARE_PROVIDER_SITE_OTHER): Payer: Medicare Other | Admitting: Ophthalmology

## 2020-03-21 ENCOUNTER — Other Ambulatory Visit: Payer: Self-pay

## 2020-03-21 DIAGNOSIS — H43813 Vitreous degeneration, bilateral: Secondary | ICD-10-CM

## 2020-03-21 DIAGNOSIS — E113291 Type 2 diabetes mellitus with mild nonproliferative diabetic retinopathy without macular edema, right eye: Secondary | ICD-10-CM

## 2020-03-21 DIAGNOSIS — E113392 Type 2 diabetes mellitus with moderate nonproliferative diabetic retinopathy without macular edema, left eye: Secondary | ICD-10-CM | POA: Diagnosis not present

## 2020-04-02 ENCOUNTER — Encounter: Payer: Self-pay | Admitting: Family Medicine

## 2020-04-02 ENCOUNTER — Other Ambulatory Visit: Payer: Self-pay

## 2020-04-02 ENCOUNTER — Ambulatory Visit (INDEPENDENT_AMBULATORY_CARE_PROVIDER_SITE_OTHER): Payer: Medicare Other | Admitting: Family Medicine

## 2020-04-02 VITALS — BP 122/80 | HR 61 | Temp 97.0°F | Wt 199.8 lb

## 2020-04-02 DIAGNOSIS — E875 Hyperkalemia: Secondary | ICD-10-CM

## 2020-04-02 DIAGNOSIS — M25562 Pain in left knee: Secondary | ICD-10-CM | POA: Diagnosis not present

## 2020-04-02 DIAGNOSIS — G8929 Other chronic pain: Secondary | ICD-10-CM

## 2020-04-02 DIAGNOSIS — E222 Syndrome of inappropriate secretion of antidiuretic hormone: Secondary | ICD-10-CM | POA: Diagnosis not present

## 2020-04-02 DIAGNOSIS — Z96653 Presence of artificial knee joint, bilateral: Secondary | ICD-10-CM | POA: Diagnosis not present

## 2020-04-02 DIAGNOSIS — E1169 Type 2 diabetes mellitus with other specified complication: Secondary | ICD-10-CM | POA: Diagnosis not present

## 2020-04-02 LAB — BASIC METABOLIC PANEL
BUN: 15 mg/dL (ref 6–23)
CO2: 27 mEq/L (ref 19–32)
Calcium: 9.4 mg/dL (ref 8.4–10.5)
Chloride: 94 mEq/L — ABNORMAL LOW (ref 96–112)
Creatinine, Ser: 0.87 mg/dL (ref 0.40–1.50)
GFR: 78.9 mL/min (ref >60.00)
Glucose, Bld: 109 mg/dL — ABNORMAL HIGH (ref 70–99)
Potassium: 5 mEq/L (ref 3.5–5.1)
Sodium: 126 mEq/L — ABNORMAL LOW (ref 135–145)

## 2020-04-02 NOTE — Progress Notes (Signed)
Subjective:     Kyle Simon is a 87 y.o. male presenting for discuss lab results     HPI   #Hyperkalemia - potassium resolved with stopping Celebrex - but his knee pain worsened - started celebrex about 1 month ago - significant improvement in the knee pain    #Knee pain - getting steroid injections every 3 months - has had knee replacements - and several procedures - already using topical voltaren gel -   Lab Results  Component Value Date   HGBA1C 6.8 (H) 11/20/2019     Review of Systems   Social History   Tobacco Use  Smoking Status Former Smoker  . Packs/day: 1.00  . Years: 18.00  . Pack years: 18.00  . Quit date: 03/01/1966  . Years since quitting: 54.1  Smokeless Tobacco Never Used        Objective:    BP Readings from Last 3 Encounters:  04/02/20 122/80  02/05/20 128/60  01/28/20 138/80   Wt Readings from Last 3 Encounters:  04/02/20 199 lb 12 oz (90.6 kg)  02/05/20 193 lb (87.5 kg)  01/28/20 194 lb 3.2 oz (88.1 kg)    BP 122/80   Pulse 61   Temp (!) 97 F (36.1 C) (Temporal)   Wt 199 lb 12 oz (90.6 kg)   SpO2 95%   BMI 28.66 kg/m    Physical Exam Constitutional:      Appearance: Normal appearance. He is not ill-appearing or diaphoretic.  HENT:     Right Ear: External ear normal.     Left Ear: External ear normal.     Nose: Nose normal.  Eyes:     General: No scleral icterus.    Extraocular Movements: Extraocular movements intact.     Conjunctiva/sclera: Conjunctivae normal.  Cardiovascular:     Rate and Rhythm: Normal rate.  Pulmonary:     Effort: Pulmonary effort is normal.  Musculoskeletal:     Cervical back: Neck supple.  Skin:    General: Skin is warm and dry.  Neurological:     Mental Status: He is alert. Mental status is at baseline.  Psychiatric:        Mood and Affect: Mood normal.           Assessment & Plan:   Problem List Items Addressed This Visit      Endocrine   Type 2 diabetes mellitus  with other specified complication ([place])    Diet controlled. On losartan for kidney protection. Has stage 3 CKD, though last GFR was >60. Discussed risk of declined kidney function if losartan was stopped. Will f/u labs      SIADH (syndrome of inappropriate ADH production) ([place])    Follows with endocrinology. Appreciate support. Avoid diuretics        Other   H/O total knee replacement, bilateral    Long discussion about risk of possibly stopping losartan and kidney damage overtime vs pain control. Pt would like to stay on celebrex and stop losartan if needed.       Chronic pain of left knee   Hyperkalemia - Primary    Complicated case due to comorbidity. Pt with chronic knee pain and noted significant improvement with celebrex but this likely caused hyperkalemia. Has SIADH so is not a candidate for diuretic. Repeat labs. If elevated - plan for low potassium diet and recheck. If still elevated will plan for stopping losartan.       Relevant Orders   Basic metabolic  panel       Return if symptoms worsen or fail to improve.  Lesleigh Noe, MD  This visit occurred during the SARS-CoV-2 public health emergency.  Safety protocols were in place, including screening questions prior to the visit, additional usage of staff PPE, and extensive cleaning of exam room while observing appropriate contact time as indicated for disinfecting solutions.

## 2020-04-02 NOTE — Assessment & Plan Note (Signed)
Diet controlled. On losartan for kidney protection. Has stage 3 CKD, though last GFR was >60. Discussed risk of declined kidney function if losartan was stopped. Will f/u labs

## 2020-04-02 NOTE — Patient Instructions (Signed)
We will repeat labs today   Low potassium diet -- must   If high:   1) Stop Celebrex (this will have the best long term kidney healthy outcome)   2) Start diuretic -- as long as it does not worsen sodium (would ask Dr. Dwyane Dee)  3) Stop Losartan (Risk of worsening kidney function) -- do a different blood pressure medication  4) Just add low potassium diet and recheck  5) Consider a nephrology consult - for further guidance

## 2020-04-02 NOTE — Assessment & Plan Note (Signed)
Follows with endocrinology. Appreciate support. Avoid diuretics

## 2020-04-02 NOTE — Assessment & Plan Note (Signed)
Complicated case due to comorbidity. Pt with chronic knee pain and noted significant improvement with celebrex but this likely caused hyperkalemia. Has SIADH so is not a candidate for diuretic. Repeat labs. If elevated - plan for low potassium diet and recheck. If still elevated will plan for stopping losartan.

## 2020-04-02 NOTE — Assessment & Plan Note (Signed)
Long discussion about risk of possibly stopping losartan and kidney damage overtime vs pain control. Pt would like to stay on celebrex and stop losartan if needed.

## 2020-04-17 DIAGNOSIS — L84 Corns and callosities: Secondary | ICD-10-CM | POA: Diagnosis not present

## 2020-04-17 DIAGNOSIS — I739 Peripheral vascular disease, unspecified: Secondary | ICD-10-CM | POA: Diagnosis not present

## 2020-04-17 DIAGNOSIS — E1151 Type 2 diabetes mellitus with diabetic peripheral angiopathy without gangrene: Secondary | ICD-10-CM | POA: Diagnosis not present

## 2020-04-17 DIAGNOSIS — L603 Nail dystrophy: Secondary | ICD-10-CM | POA: Diagnosis not present

## 2020-05-02 ENCOUNTER — Ambulatory Visit (INDEPENDENT_AMBULATORY_CARE_PROVIDER_SITE_OTHER): Payer: Medicare Other | Admitting: Endocrinology

## 2020-05-02 ENCOUNTER — Other Ambulatory Visit: Payer: Self-pay

## 2020-05-02 ENCOUNTER — Encounter: Payer: Self-pay | Admitting: Endocrinology

## 2020-05-02 VITALS — BP 106/66 | HR 76 | Ht 70.0 in | Wt 197.0 lb

## 2020-05-02 DIAGNOSIS — E063 Autoimmune thyroiditis: Secondary | ICD-10-CM | POA: Diagnosis not present

## 2020-05-02 DIAGNOSIS — R7303 Prediabetes: Secondary | ICD-10-CM

## 2020-05-02 DIAGNOSIS — E871 Hypo-osmolality and hyponatremia: Secondary | ICD-10-CM | POA: Diagnosis not present

## 2020-05-02 DIAGNOSIS — Z8639 Personal history of other endocrine, nutritional and metabolic disease: Secondary | ICD-10-CM

## 2020-05-02 LAB — BASIC METABOLIC PANEL
BUN: 15 mg/dL (ref 6–23)
CO2: 26 mEq/L (ref 19–32)
Calcium: 9.4 mg/dL (ref 8.4–10.5)
Chloride: 97 mEq/L (ref 96–112)
Creatinine, Ser: 0.93 mg/dL (ref 0.40–1.50)
GFR: 75.04 mL/min (ref >60.00)
Glucose, Bld: 119 mg/dL — ABNORMAL HIGH (ref 70–99)
Potassium: 4.6 mEq/L (ref 3.5–5.1)
Sodium: 130 mEq/L — ABNORMAL LOW (ref 135–145)

## 2020-05-02 LAB — T4, FREE: Free T4: 1.08 ng/dL (ref 0.60–1.60)

## 2020-05-02 LAB — HEMOGLOBIN A1C: Hgb A1c MFr Bld: 6.5 % (ref 4.6–6.5)

## 2020-05-02 LAB — CORTISOL: Cortisol, Plasma: 12.3 ug/dL

## 2020-05-02 LAB — TSH: TSH: 3.51 u[IU]/mL (ref 0.35–4.50)

## 2020-05-02 NOTE — Progress Notes (Signed)
Patient ID: Kyle Simon, male   DOB: 25-Dec-1932, 87 y.o.   MRN: [id number]            Reason for Appointment: Endocrinology follow-up visit    History of Present Illness:   HYPONATREMIA:  Background history:  Serum sodium has been low since at least 2011 and has been as low as 125 in the past More recently lowest reading 127 He has not had any associated symptoms of decreased appetite, nausea or drowsiness Not on any thiazide diuretics or SSRI drugs Has had negative endocrine work-up for cortisol deficiencies  Has a history of drinking a lot of water partly for his constipation  Recent history: His urine osmolality is last high at 460 done in 10/21, was also high at 442 on initial evaluation Urine sodium was over 90 previously  He was told to cut back on his fluid intake especially coffee  Even with a trial of salt tablets his sodium was down to 126 in 9/21 Also was having nausea from taking more than 1 regular tablet daily  No unusual fatigue, lethargy or change in appetite No weight change  Sodium level has been consistently below 130 recently, last 126 Initially this was significantly better at 130 compared to 127 after starting DEMECLOCYCLINE 150 mg daily in 9/21  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  Labs pending from today  CT scan of chest did not show any tumor  Lab Results  Component Value Date   NA 126 (L) 04/02/2020   K 5.0 04/02/2020   CL 94 (L) 04/02/2020   CO2 27 04/02/2020     Hypothyroidism was first diagnosed  around 2005 years ago   At the time of diagnosis patient was not having symptoms of  fatigue, cold sensitivity, difficulty concentrating, dry skin, weight gain Likely had only done routine testing and was told that he was hypothyroid and started him on unknown dose of levothyroxine  He did not start feeling any different when he started taking levothyroxine initially Also usually does not feel any  different with dosage adjustments that have been made Apparently his dose was 150 g for some time but this was reduced in 06/2015 when his TSH was low normal Since then he has been taking 150 g 5 days a week and 125 g twice a week Non-insulin initial consultation he was complaining that since spring time his energy level has been not as good and he has not been able to do as much with activities as he used to   He was referred here because his TSH is tending to be high but also his free T4 was relatively higher in 7/18  Recent history: On his initial consultation since his TSH was still relatively high and he was taking the equivalent of about 142 g of levothyroxine daily he was switched to taking 150 g once daily Also he was told to not take his levothyroxine at bedtime but take it before breakfast  He is taking his levothyroxine regularly before breakfast every morning Not taking any multivitamins in the morning at the same time  TSH had gone up to 6.6 in July  With taking 1 extra tablet per week of the levothyroxine 150 mcg his last TSH was normal but now he says that he will frequently forget to take the extra tablet weekly Labs pending   Patient's weight history is as follows:  Wt Readings from Last 3 Encounters:  05/02/20 197 lb (89.4  kg)  04/02/20 199 lb 12 oz (90.6 kg)  02/05/20 193 lb (87.5 kg)    Thyroid function results have been as follows:  Lab Results  Component Value Date   TSH 1.51 01/28/2020   TSH 2.22 11/20/2019   TSH 6.58 (H) 09/12/2019   TSH 3.78 07/24/2019   FREET4 1.48 01/28/2020   FREET4 1.30 11/20/2019   FREET4 1.12 09/12/2019   FREET4 1.27 07/24/2019   T3FREE 3.2 12/28/2016   T3FREE 2.7 10/17/2015    Lab Results  Component Value Date   TSH 1.51 01/28/2020   TSH 2.22 11/20/2019   TSH 6.58 (H) 09/12/2019   TSH 3.78 07/24/2019   TSH 3.350 02/16/2019   TSH 3.09 12/11/2018   TSH 4.910 (H) 11/15/2018   TSH 6.950 (H) 08/22/2017   TSH 2.87  04/26/2017     Past Medical History:  Diagnosis Date  . Arthritis    both hands;Dr.Deveshwar  . Constipation due to pain medication   . Diabetes mellitus without complication ([place])    Type II. Uses no medication. Pt controls with diet and weight  . Enlarged prostate   . Frequent urination at night   . GERD (gastroesophageal reflux disease)   . History of noncompliance with medical treatment, presenting hazards to health 11/20/2018  . Hyperlipidemia   . Hyperlipidemia associated with type 2 diabetes mellitus ([place]) 04/19/2007   Qualifier: Diagnosis of  By: Linna Darner MD, Gwyndolyn Saxon    . Hypertension   . Hypothyroidism   . Polio    as a child w/o complications  . Snoring    no sleep apnea  . Statin declined-  11/20/2018   patient understands risks associated with this decision and has been advised to restart by cardiologist as well as myself several times  . Torn meniscus   . Transfusion history 2012   post TKR    Past Surgical History:  Procedure Laterality Date  . ANTERIOR LAT LUMBAR FUSION Right 11/03/2017   Procedure: Right Lumbar three-four Anterolateral lumbar interbody fusion with lateral plate;  Surgeon: Erline Levine, MD;  Location: [place];  Service: Neurosurgery;  Laterality: Right;  . BACK SURGERY  2011   hemiarthroplasty 01/11/1999 and 6  . CARDIAC CATHETERIZATION  09/2001   negative  . COLONOSCOPY    . EYE SURGERY  2003   R&L cataract removed & IOL- 2003 & 2007,Dr.Jenkins  . INGUINAL HERNIA REPAIR  2002   right, Dr. Truitt Leep  . JOINT REPLACEMENT  2012   left knee replacement  . LIPOMA EXCISION     back  . MENISCECTOMY Bilateral    Dr. Noemi Chapel  . PROSTATE SURGERY  07/2006   for urinary urgency, Dr. Risa Grill  . ROTATOR CUFF REPAIR Right October, 2016  . sinus & throat surgery-1995  1995  . TONSILLECTOMY    . TOTAL KNEE ARTHROPLASTY  01/11/2011   Procedure: TOTAL KNEE ARTHROPLASTY;  Surgeon: Rudean Haskell, MD;  Location: [place];  Service: Orthopedics;  Laterality:  Left;  . TOTAL KNEE ARTHROPLASTY Right 11/22/2016   Procedure: TOTAL KNEE ARTHROPLASTY;  Surgeon: Vickey Huger, MD;  Location: [place];  Service: Orthopedics;  Laterality: Right;  . UVULECTOMY  [id number]   Dr. Erik Obey  . VASECTOMY    . WRIST SURGERY  1959   fracture- right    Family History  Problem Relation Age of Onset  . Sudden death Father        accident  . Coronary artery disease Mother   . Emphysema Mother   .  Diverticulosis Mother   . Thyroid disease Mother   . Asthma Brother   . Bone cancer Brother   . Diabetes Paternal Grandmother   . Sarcoidosis Son   . Thyroid disease Daughter   . Anesthesia problems Neg Hx   . Hypotension Neg Hx   . Malignant hyperthermia Neg Hx   . Pseudochol deficiency Neg Hx   . Colon cancer Neg Hx     Social History:  reports that he quit smoking about 54 years ago. He has a 18.00 pack-year smoking history. He has never used smokeless tobacco. He reports current alcohol use of about 4.0 standard drinks of alcohol per week. He reports that he does not use drugs.  Allergies:  Allergies  Allergen Reactions  . Simvastatin Other (See Comments)    MYALGIAS  . Other     Other reaction(s): Unknown Other reaction(s): Unknown  . Oxycodone Other (See Comments)    Severe constipation    Allergies as of 05/02/2020      Reactions   Simvastatin Other (See Comments)   MYALGIAS   Other    Other reaction(s): Unknown Other reaction(s): Unknown   Oxycodone Other (See Comments)   Severe constipation      Medication List       Accurate as of May 02, 2020 10:53 AM. If you have any questions, ask your nurse or doctor.        acetaminophen 500 MG tablet Commonly known as: TYLENOL Take 1,000 mg by mouth every 6 (six) hours as needed for mild pain. Takes 2 tablets nightly   ARTIFICIAL TEARS OP Place 1 drop into both eyes 2 (two) times daily.   aspirin 81 MG tablet Take 81 mg by mouth every evening.   blood glucose meter kit and supplies  Kit Inject 1 each into the skin as directed. Dispense based on patient and insurance preference. Use up to four times daily as directed. (FOR ICD-9 250.00, 250.01).   CALCIUM 600 + D PO Take 1 tablet by mouth daily.   celecoxib 200 MG capsule Commonly known as: CELEBREX Take by mouth 2 (two) times daily.   CITRUCEL PO Take by mouth at bedtime.   demeclocycline 150 MG tablet Commonly known as: DECLOMYCIN Take 1 tablet (150 mg total) by mouth daily.   diclofenac Sodium 1 % Gel Commonly known as: VOLTAREN Apply topically.   Fish Oil 1000 MG Caps Take 1,000 mg by mouth every evening.   glucose blood test strip Commonly known as: PRECISION XTRA TEST STRIPS USE TO CHECK BLOOD SUGAR ONCE DAILY DX E11.9   Lancets Ultra Thin 30G Misc Patient is to use to check blood glucose in the AM fasting and 2 hours after largest meal.   levothyroxine 150 MCG tablet Commonly known as: Synthroid Take 1 tablet (150 mcg total) by mouth daily before breakfast.   losartan 50 MG tablet Commonly known as: COZAAR Take 1 tablet (50 mg total) by mouth daily.   multivitamin with minerals Tabs tablet Take 1 tablet by mouth daily.   NONFORMULARY OR COMPOUNDED ITEM Glucometer (Precision XTRA- Model #[id number])   Restora Caps One tab qd What changed:   how much to take  how to take this  when to take this  additional instructions   rosuvastatin 5 MG tablet Commonly known as: Crestor Take 1 tablet (5 mg total) by mouth at bedtime.   tamsulosin 0.4 MG Caps capsule Commonly known as: FLOMAX Take 0.4 mg by mouth 2 (two) times daily.  vitamin B-12 1000 MCG tablet Commonly known as: CYANOCOBALAMIN Take 1,000 mcg by mouth daily.        Review of Systems   Taking losartan 50 mg for reportedly kidney protection and not clear if this was prescribed for hypertension He sometimes checks blood pressure at home No lightheadedness recently  BP Readings from Last 3 Encounters:   05/02/20 124/76  04/02/20 122/80  02/05/20 128/60     He has history of prediabetes with last A1c 6.8.  Also several years ago he did have blood sugars in the diabetic range  Glucose 98-110 at home recently Has not been treated with medications  Wt Readings from Last 3 Encounters:  05/02/20 197 lb (89.4 kg)  04/02/20 199 lb 12 oz (90.6 kg)  02/05/20 193 lb (87.5 kg)   Lab Results  Component Value Date   HGBA1C 6.8 (H) 11/20/2019   HGBA1C 6.2 (A) 06/06/2019   HGBA1C 6.3 (H) 02/16/2019   Lab Results  Component Value Date   MICROALBUR 10 04/14/2017   LDLCALC 49 02/16/2019   CREATININE 0.87 04/02/2020           Examination:    BP 124/76   Pulse 76   Ht 5' 10" (1.778 m)   Wt 197 lb (89.4 kg)   SpO2 99%   BMI 28.27 kg/m    Assessment:   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 and CT scan did not show tumor Although previously his sodium had improved to 130 with demeclocycline 150 mg daily Sodium is still recently down to 126 despite fluid restriction Again he is appearing to be asymptomatic from his chronic hyponatremia  Labs to be checked today   HYPOTHYROIDISM: He is supposed to be taking 8 tablets/week of the 150 mcg levothyroxine but he is not always taking the extra 1pill/week  History of abnormal glucose levels: A1c to be checked  Orthostatic hypotension: Blood pressure is lower although asymptomatic Likely does not need losartan or at least a lower dose especially with his recent history of mild hyperkalemia  PLAN:   Sodium and renal function to be checked Likely can give him a trial of Samsca 15 mg 3 days a week for the SIADH, this appears to be on his formulary  He will have a serum cortisol level checked again, consider Cortrosyn stimulation Also recheck thyroid levels Also will continue fluid restriction  Reduce losartan to half tablet for now  Follow-up 6 weeks   Elayne Snare 05/02/2020, 10:53 AM      Note: This office note was prepared with Dragon voice recognition system technology. Any transcriptional errors that result from this process are unintentional.

## 2020-05-02 NOTE — Patient Instructions (Signed)
Reduce Losartan to 1/2 tab

## 2020-06-13 ENCOUNTER — Other Ambulatory Visit: Payer: Self-pay

## 2020-06-13 ENCOUNTER — Encounter (INDEPENDENT_AMBULATORY_CARE_PROVIDER_SITE_OTHER): Payer: Medicare Other | Admitting: Ophthalmology

## 2020-06-13 DIAGNOSIS — E113393 Type 2 diabetes mellitus with moderate nonproliferative diabetic retinopathy without macular edema, bilateral: Secondary | ICD-10-CM

## 2020-06-13 DIAGNOSIS — H43813 Vitreous degeneration, bilateral: Secondary | ICD-10-CM | POA: Diagnosis not present

## 2020-06-16 ENCOUNTER — Telehealth: Payer: Self-pay | Admitting: *Deleted

## 2020-06-16 NOTE — Telephone Encounter (Signed)
His blood work looked good in March. Would recommend he continue to follow-up with endocrinology based on their suggestion. No need for lab appointment

## 2020-06-16 NOTE — Telephone Encounter (Signed)
Sent ot a mychart message relaying Dr. Verda Cumins message.

## 2020-06-16 NOTE — Telephone Encounter (Signed)
Pt called the scheduling line saying it's time for him to recheck his sodium. Looked at notes and PCP said in Feb 2022 to recheck sodium in one month. One month later in March Dr. Dwyane Dee (endo) repeated labs. Pt wasn't aware and thinks he needs f/u labs to recheck everything again. ?? If pt needs a f/u lab appt or is he okay.  I advise pt I will send message to PCP and someone will f/u with him regarding lab appt

## 2020-06-27 DIAGNOSIS — Z96653 Presence of artificial knee joint, bilateral: Secondary | ICD-10-CM | POA: Diagnosis not present

## 2020-06-27 DIAGNOSIS — M659 Synovitis and tenosynovitis, unspecified: Secondary | ICD-10-CM | POA: Diagnosis not present

## 2020-06-30 ENCOUNTER — Encounter: Payer: Self-pay | Admitting: Endocrinology

## 2020-06-30 ENCOUNTER — Ambulatory Visit (INDEPENDENT_AMBULATORY_CARE_PROVIDER_SITE_OTHER): Payer: Medicare Other | Admitting: Endocrinology

## 2020-06-30 ENCOUNTER — Other Ambulatory Visit: Payer: Self-pay

## 2020-06-30 VITALS — BP 112/68 | HR 60 | Ht 70.0 in | Wt 192.4 lb

## 2020-06-30 DIAGNOSIS — E871 Hypo-osmolality and hyponatremia: Secondary | ICD-10-CM

## 2020-06-30 DIAGNOSIS — Z8639 Personal history of other endocrine, nutritional and metabolic disease: Secondary | ICD-10-CM

## 2020-06-30 DIAGNOSIS — E063 Autoimmune thyroiditis: Secondary | ICD-10-CM | POA: Diagnosis not present

## 2020-06-30 LAB — BASIC METABOLIC PANEL WITH GFR
BUN: 35 mg/dL — ABNORMAL HIGH (ref 6–23)
CO2: 25 meq/L (ref 19–32)
Calcium: 9 mg/dL (ref 8.4–10.5)
Chloride: 94 meq/L — ABNORMAL LOW (ref 96–112)
Creatinine, Ser: 1.17 mg/dL (ref 0.40–1.50)
GFR: 56.91 mL/min — ABNORMAL LOW
Glucose, Bld: 168 mg/dL — ABNORMAL HIGH (ref 70–99)
Potassium: 4.8 meq/L (ref 3.5–5.1)
Sodium: 127 meq/L — ABNORMAL LOW (ref 135–145)

## 2020-06-30 LAB — TSH: TSH: 0.55 u[IU]/mL (ref 0.35–4.50)

## 2020-06-30 NOTE — Progress Notes (Addendum)
Patient ID: Kyle Simon, male   DOB: Mar 30, 1932, 87 y.o.   MRN: [id number]            Reason for Appointment: Endocrinology follow-up visit    History of Present Illness:   HYPONATREMIA:  Background history:  Serum sodium has been low since at least 2011 and has been as low as 125 in the past More recently lowest reading 127 He has not had any associated symptoms of decreased appetite, nausea or drowsiness Not on any thiazide diuretics or SSRI drugs Has had negative endocrine work-up for cortisol deficiencies  Has a history of drinking a lot of water partly for his constipation  Recent history: His urine osmolality is last high at 460 done in 10/21, was also high at 442 on initial evaluation Urine sodium was over 90 previously  He was told to cut back on his fluid intake especially coffee  Even with a trial of salt tablets his sodium was down to 126 in 9/21 Also was having nausea from taking more than 1 regular tablet daily  Still does not complain of feeling tired, lethargy or loss. He has lost weight because of dental issues  Sodium level has been mostly below 130 recently, last 130  Initially this was significantly better after starting DEMECLOCYCLINE 150 mg daily in 9/21 However this was stopped when his sodium decreased on the treatment down to 126  Labs pending from today  CT scan of chest did not show any tumor  Lab Results  Component Value Date   NA 130 (L) 05/02/2020   K 4.6 05/02/2020   CL 97 05/02/2020   CO2 26 05/02/2020     Hypothyroidism was first diagnosed  around 2005 years ago   At the time of diagnosis patient was not having symptoms of  fatigue, cold sensitivity, difficulty concentrating, dry skin, weight gain Likely had only done routine testing and was told that he was hypothyroid and started him on unknown dose of levothyroxine  He did not start feeling any different when he started taking levothyroxine initially Also usually does not  feel any different with dosage adjustments that have been made Apparently his dose was 150 g for some time but this was reduced in 06/2015 when his TSH was low normal Since then he has been taking 150 g 5 days a week and 125 g twice a week Non-insulin initial consultation he was complaining that since spring time his energy level has been not as good and he has not been able to do as much with activities as he used to   He was referred here because his TSH is tending to be high but also his free T4 was relatively higher in 7/18  Recent history: On his initial consultation since his TSH was still relatively high and he was taking the equivalent of about 142 g of levothyroxine daily he was switched to taking 150 g once daily Also he was told to not take his levothyroxine at bedtime but take it before breakfast  He is taking his levothyroxine regularly before breakfast every morning Not taking any multivitamins in the morning at the same time  With taking 1 extra tablet per week of the levothyroxine 150 mcg his last TSH was normal  Again he says that he will about half the time forget to take the extra tablet weekly, with this his TSH was 3.5 on the last visit  Labs pending   Patient's weight history is as follows:  Wt  Readings from Last 3 Encounters:  06/30/20 192 lb 6.4 oz (87.3 kg)  05/02/20 197 lb (89.4 kg)  04/02/20 199 lb 12 oz (90.6 kg)    Thyroid function results have been as follows:  Lab Results  Component Value Date   TSH 3.51 05/02/2020   TSH 1.51 01/28/2020   TSH 2.22 11/20/2019   TSH 6.58 (H) 09/12/2019   FREET4 1.08 05/02/2020   FREET4 1.48 01/28/2020   FREET4 1.30 11/20/2019   FREET4 1.12 09/12/2019   T3FREE 3.2 12/28/2016   T3FREE 2.7 10/17/2015    Lab Results  Component Value Date   TSH 3.51 05/02/2020   TSH 1.51 01/28/2020   TSH 2.22 11/20/2019   TSH 6.58 (H) 09/12/2019   TSH 3.78 07/24/2019   TSH 3.350 02/16/2019   TSH 3.09 12/11/2018   TSH  4.910 (H) 11/15/2018   TSH 6.950 (H) 08/22/2017     Past Medical History:  Diagnosis Date  . Arthritis    both hands;Dr.Deveshwar  . Constipation due to pain medication   . Diabetes mellitus without complication ([place])    Type II. Uses no medication. Pt controls with diet and weight  . Enlarged prostate   . Frequent urination at night   . GERD (gastroesophageal reflux disease)   . History of noncompliance with medical treatment, presenting hazards to health 11/20/2018  . Hyperlipidemia   . Hyperlipidemia associated with type 2 diabetes mellitus ([place]) 04/19/2007   Qualifier: Diagnosis of  By: Linna Darner MD, Gwyndolyn Saxon    . Hypertension   . Hypothyroidism   . Polio    as a child w/o complications  . Snoring    no sleep apnea  . Statin declined-  11/20/2018   patient understands risks associated with this decision and has been advised to restart by cardiologist as well as myself several times  . Torn meniscus   . Transfusion history 2012   post TKR    Past Surgical History:  Procedure Laterality Date  . ANTERIOR LAT LUMBAR FUSION Right 11/03/2017   Procedure: Right Lumbar three-four Anterolateral lumbar interbody fusion with lateral plate;  Surgeon: Erline Levine, MD;  Location: [facility];  Service: Neurosurgery;  Laterality: Right;  . BACK SURGERY  2011   hemiarthroplasty 01/11/1999 and 6  . CARDIAC CATHETERIZATION  09/2001   negative  . COLONOSCOPY    . EYE SURGERY  2003   R&L cataract removed & IOL- 2003 & 2007,Dr.Jenkins  . INGUINAL HERNIA REPAIR  2002   right, Dr. Truitt Leep  . JOINT REPLACEMENT  2012   left knee replacement  . LIPOMA EXCISION     back  . MENISCECTOMY Bilateral    Dr. Noemi Chapel  . PROSTATE SURGERY  07/2006   for urinary urgency, Dr. Risa Grill  . ROTATOR CUFF REPAIR Right October, 2016  . sinus & throat surgery-1995  1995  . TONSILLECTOMY    . TOTAL KNEE ARTHROPLASTY  01/11/2011   Procedure: TOTAL KNEE ARTHROPLASTY;  Surgeon: Rudean Haskell, MD;  Location: [facility];   Service: Orthopedics;  Laterality: Left;  . TOTAL KNEE ARTHROPLASTY Right 11/22/2016   Procedure: TOTAL KNEE ARTHROPLASTY;  Surgeon: Vickey Huger, MD;  Location: [facility];  Service: Orthopedics;  Laterality: Right;  . UVULECTOMY  0814   Dr. Erik Obey  . VASECTOMY    . WRIST SURGERY  1959   fracture- right    Family History  Problem Relation Age of Onset  . Sudden death Father        accident  .  Coronary artery disease Mother   . Emphysema Mother   . Diverticulosis Mother   . Thyroid disease Mother   . Asthma Brother   . Bone cancer Brother   . Diabetes Paternal Grandmother   . Sarcoidosis Son   . Thyroid disease Daughter   . Anesthesia problems Neg Hx   . Hypotension Neg Hx   . Malignant hyperthermia Neg Hx   . Pseudochol deficiency Neg Hx   . Colon cancer Neg Hx     Social History:  reports that he quit smoking about 54 years ago. He has a 18.00 pack-year smoking history. He has never used smokeless tobacco. He reports current alcohol use of about 4.0 standard drinks of alcohol per week. He reports that he does not use drugs.  Allergies:  Allergies  Allergen Reactions  . Simvastatin Other (See Comments)    MYALGIAS  . Other     Other reaction(s): Unknown Other reaction(s): Unknown  . Oxycodone Other (See Comments)    Severe constipation    Allergies as of 06/30/2020      Reactions   Simvastatin Other (See Comments)   MYALGIAS   Other    Other reaction(s): Unknown Other reaction(s): Unknown   Oxycodone Other (See Comments)   Severe constipation      Medication List       Accurate as of Jun 30, 2020  1:42 PM. If you have any questions, ask your nurse or doctor.        acetaminophen 500 MG tablet Commonly known as: TYLENOL Take 1,000 mg by mouth every 6 (six) hours as needed for mild pain. Takes 2 tablets nightly   ARTIFICIAL TEARS OP Place 1 drop into both eyes 2 (two) times daily.   aspirin 81 MG tablet Take 81 mg by mouth every evening.   blood glucose  meter kit and supplies Kit Inject 1 each into the skin as directed. Dispense based on patient and insurance preference. Use up to four times daily as directed. (FOR ICD-9 250.00, 250.01).   CALCIUM 600 + D PO Take 1 tablet by mouth daily.   celecoxib 200 MG capsule Commonly known as: CELEBREX Take by mouth 2 (two) times daily.   CITRUCEL PO Take by mouth at bedtime.   diclofenac Sodium 1 % Gel Commonly known as: VOLTAREN Apply topically.   Fish Oil 1000 MG Caps Take 1,000 mg by mouth every evening.   glucose blood test strip Commonly known as: PRECISION XTRA TEST STRIPS USE TO CHECK BLOOD SUGAR ONCE DAILY DX E11.9   Lancets Ultra Thin 30G Misc Patient is to use to check blood glucose in the AM fasting and 2 hours after largest meal.   levothyroxine 150 MCG tablet Commonly known as: Synthroid Take 1 tablet (150 mcg total) by mouth daily before breakfast.   losartan 50 MG tablet Commonly known as: COZAAR Take 1 tablet (50 mg total) by mouth daily.   multivitamin with minerals Tabs tablet Take 1 tablet by mouth daily.   NONFORMULARY OR COMPOUNDED ITEM Glucometer (Precision XTRA- Model #[id number])   Restora Caps One tab qd What changed:   how much to take  how to take this  when to take this  additional instructions   rosuvastatin 5 MG tablet Commonly known as: Crestor Take 1 tablet (5 mg total) by mouth at bedtime.   tamsulosin 0.4 MG Caps capsule Commonly known as: FLOMAX Take 0.4 mg by mouth 2 (two) times daily.   vitamin B-12 1000 MCG  tablet Commonly known as: CYANOCOBALAMIN Take 1,000 mcg by mouth daily.        Review of Systems   Was prescribed losartan 50 mg for reportedly kidney protection by PCP This has been reduced to half tablet daily since 3/22 because of low normal blood pressure  He sometimes checks blood pressure at home, recently about 572 systolic No lightheadedness recently Cortisol level has been normal  BP Readings  from Last 3 Encounters:  06/30/20 112/68  05/02/20 106/66  04/02/20 122/80     He has history of prediabetes with last A1c 6.8.  Also several years ago he did have blood sugars in the diabetic range  Glucose 98-110 at home recently Has not been treated with medications  Wt Readings from Last 3 Encounters:  06/30/20 192 lb 6.4 oz (87.3 kg)  05/02/20 197 lb (89.4 kg)  04/02/20 199 lb 12 oz (90.6 kg)   Lab Results  Component Value Date   HGBA1C 6.5 05/02/2020   HGBA1C 6.8 (H) 11/20/2019   HGBA1C 6.2 (A) 06/06/2019   Lab Results  Component Value Date   MICROALBUR 10 04/14/2017   LDLCALC 49 02/16/2019   CREATININE 0.93 05/02/2020           Examination:    BP 112/68   Pulse 60   Ht 5' 10"  (1.778 m)   Wt 192 lb 6.4 oz (87.3 kg)   SpO2 99%   BMI 27.61 kg/m    Assessment:   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 and CT scan did not show tumor Although previously his sodium had improved to 130 with demeclocycline 150 mg daily subsequently sodium had dropped down again He is not on any treatment and has been advised fluid restriction  Even with levels as low as 126 he is asymptomatic from his chronic hyponatremia  Labs to be checked today   HYPOTHYROIDISM: He is supposed to be taking 8 tablets/week of the 150 mcg levothyroxine but he is again forgetting the extra tablet weekly about half the time   Orthostatic hypotension: Blood pressure is lower but better than before with reducing losartan  PLAN:   Sodium and renal function to be checked  If sodium is significantly low can give him a trial of Samsca 15 mg 3 days a week for the SIADH, this appears to be on his formulary  Also recheck thyroid levels and likely will switch him to 175 mcg levothyroxine for better compliance Also will continue fluid restriction  Continue 25 mg losartan  Follow-up to be decided   Elayne Snare 06/30/2020, 1:42 PM     Note: This office note  was prepared with Dragon voice recognition system technology. Any transcriptional errors that result from this process are unintentional.  Addendum: Sodium 127, since he is asymptomatic we will continue to monitor every 3 months TSH normal, to take extra half a pill on Sundays only

## 2020-07-17 DIAGNOSIS — L84 Corns and callosities: Secondary | ICD-10-CM | POA: Diagnosis not present

## 2020-07-17 DIAGNOSIS — E1151 Type 2 diabetes mellitus with diabetic peripheral angiopathy without gangrene: Secondary | ICD-10-CM | POA: Diagnosis not present

## 2020-07-17 DIAGNOSIS — L603 Nail dystrophy: Secondary | ICD-10-CM | POA: Diagnosis not present

## 2020-07-17 DIAGNOSIS — I739 Peripheral vascular disease, unspecified: Secondary | ICD-10-CM | POA: Diagnosis not present

## 2020-08-15 ENCOUNTER — Other Ambulatory Visit: Payer: Self-pay | Admitting: Endocrinology

## 2020-08-15 DIAGNOSIS — E063 Autoimmune thyroiditis: Secondary | ICD-10-CM

## 2020-08-25 ENCOUNTER — Telehealth: Payer: Self-pay | Admitting: *Deleted

## 2020-08-25 DIAGNOSIS — U071 COVID-19: Secondary | ICD-10-CM | POA: Diagnosis not present

## 2020-08-25 NOTE — Telephone Encounter (Signed)
PLEASE NOTE: All timestamps contained within this report are represented as [place] Standard Time. CONFIDENTIALTY NOTICE: This fax transmission is intended only for the addressee. It contains information that is legally privileged, confidential or otherwise protected from use or disclosure. If you are not the intended recipient, you are strictly prohibited from reviewing, disclosing, copying using or disseminating any of this information or taking any action in reliance on or regarding this information. If you have received this fax in error, please notify us immediately by telephone so that we can arrange for its return to [place]. Phone: [phone], Toll-Free: [phone], Fax: [phone] Page: 1 of 3 Call Id: [id number] [facility] Night - Client TELEPHONE ADVICE RECORD [facility] Patient Name: Kyle Simon Gender: Male DOB: 1933/01/17 Age: 87 Y 55 M Return Phone Number: [phone] (Primary), [phone] (Secondary) Address: City/ State/ Zip: [place] [address] Client [facility] Night - Client Client Site [place] Physician Waunita Schooner- MD Contact Type Call Who Is Calling Patient / Member / Family / Caregiver Call Type Triage / Clinical Caller Name June Relationship To Patient Spouse Return Phone Number [phone] (Primary) Chief Complaint Fever (non urgent symptom) (> THREE MONTHS) Reason for Call Symptomatic / Request for Ewa Gentry states her husband has covid experiencing a cough with cold symptoms and fever. Translation No Nurse Assessment Nurse: Mariea Clonts, RN, Cristan Date/Time (Eastern Time): 08/24/2020 3:17:33 PM Confirm and document reason for call. If symptomatic, describe symptoms. ---Caller states her husband has covid positive today with home test. experiencing a cough with cold symptoms and fever. Symptoms started yesterday. Current temp  99.1 temporal. Does the patient have any new or worsening symptoms? ---Yes Will a triage be completed? ---Yes Related visit to physician within the last 2 weeks? ---No Does the PT have any chronic conditions? (i.e. diabetes, asthma, this includes High risk factors for pregnancy, etc.) ---No Is this a behavioral health or substance abuse call? ---No Guidelines Guideline Title Affirmed Question Affirmed Notes Nurse Date/Time (Eastern Time) COVID-19 - Diagnosed or Suspected [1] HIGH RISK for severe COVID complications (e.g., weak immune system, age > 35 years, obesity with BMI > 25, pregnant, chronic lung disease or other chronic medical Mariea Clonts, RN, Cristan 08/24/2020 3:17:42 PM PLEASE NOTE: All timestamps contained within this report are represented as [place] Standard Time. CONFIDENTIALTY NOTICE: This fax transmission is intended only for the addressee. It contains information that is legally privileged, confidential or otherwise protected from use or disclosure. If you are not the intended recipient, you are strictly prohibited from reviewing, disclosing, copying using or disseminating any of this information or taking any action in reliance on or regarding this information. If you have received this fax in error, please notify us immediately by telephone so that we can arrange for its return to [place]. Phone: [phone], Toll-Free: [phone], Fax: [phone] Page: 2 of 3 Call Id: [id number] Guidelines Guideline Title Affirmed Question Affirmed Notes Nurse Date/Time Eilene Ghazi Time) condition) AND [2] COVID symptoms (e.g., cough, fever) (Exceptions: Already seen by PCP and no new or worsening symptoms.) Disp. Time Eilene Ghazi Time) Disposition Final User 08/24/2020 9:10:22 AM Attempt made - message left Jerene Bears, RN, Will 08/24/2020 9:25:56 AM FINAL ATTEMPT MADE - message left Jerene Bears, RN, Will 08/24/2020 9:26:02 AM Send to RN Final Attempt Lenna Sciara, RN,  Will 08/24/2020 3:06:44 PM Attempt made - no message left Mariea Clonts, RN, Cristan 08/24/2020 3:07:02 PM Attempt made - message left Mariea Clonts, Therapist, sports, [employer]  08/24/2020 3:28:15 PM See HCP within 4 Hours (or PCP triage) Yes Mariea Clonts, RN, Cristan Disposition Overriden: Call PCP Now Override Reason: Patient's symptoms need a higher level of care Caller Disagree/Comply Disagree Caller Understands Yes PreDisposition Did not know what to do Care Advice Given Per Guideline SEE HCP (OR PCP TRIAGE) WITHIN 4 HOURS: * IF OFFICE WILL BE CLOSED AND NO PCP (PRIMARY CARE PROVIDER) SECOND-LEVEL TRIAGE: You need to be seen within the next 3 or 4 hours. A nearby Urgent Care Center [facility]) is often a good source of care. Another choice is to go to the ED. Go sooner if you become worse. * Muscle aches, headache, and other pains: Often this comes and goes with the fever. Take acetaminophen every 4 to 6 hours (Adults 650 mg) OR ibuprofen every 6 to 8 hours (Adults 400 mg). Before taking any medicine, read all the instructions on the package. * Fever: For fever over 101 F (38.3 C), take acetaminophen every 4 to 6 hours (Adults 650 mg) OR ibuprofen every 6 to 8 hours (Adults 400 mg). Before taking any medicine, read all the instructions on the package. Do not take aspirin unless your doctor has prescribed it for you. * Feeling dehydrated: Drink extra liquids. If the air in your home is dry, use a humidifier. * Cough: Use cough drops. CALL BACK IF: * You become worse * [place] HANDS OFTEN: Wash hands often with soap and water. After coughing or sneezing are important times. If soap and water are not available, use an alcohol-based hand sanitizer with at least 60% alcohol, covering all surfaces of your hands and rubbing them together until they feel dry. Avoid touching your eyes, nose, and mouth with unwashed hands. * WEAR A MASK FOR 10 DAYS: Wear a well-fitted mask for 10 full days any time you are around others inside your home or in public.  Do not go to places where you are unable to wear a mask. * STAY HOME A MINIMUM OF 5 DAYS: Home isolation is needed for at least 5 days after the symptoms started. Stay home from school or work if you are sick. Do NOT go to religious services, child care centers, shopping, or other public places. Do NOT use public transportation (e.g., bus, taxis, ride-sharing). Do NOT allow any visitors to your home. Leave the house only if you need to seek urgent medical care. * HOME REMEDY - HONEY: This old home remedy has been shown to help decrease coughing at night. The adult dosage is 2 teaspoons (10 ml) at bedtime. FEVER MEDICINES: PLEASE NOTE: All timestamps contained within this report are represented as [place] Standard Time. CONFIDENTIALTY NOTICE: This fax transmission is intended only for the addressee. It contains information that is legally privileged, confidential or otherwise protected from use or disclosure. If you are not the intended recipient, you are strictly prohibited from reviewing, disclosing, copying using or disseminating any of this information or taking any action in reliance on or regarding this information. If you have received this fax in error, please notify us immediately by telephone so that we can arrange for its return to [place]. Phone: [phone], Toll-Free: [phone], Fax: [phone] Page: 3 of 3 Call Id: [id number] Comments User: Luis Abed, RN Date/Time Eilene Ghazi Time): 08/24/2020 3:06:24 PM Reached caller on primary line, bad connection. Keep losing caller. Caller sounds to be in her vehicle at time of call. Will call back. User: Luis Abed, RN Date/Time Eilene Ghazi Time): 08/24/2020 3:21:42 PM current temp 97.0 temporal User:  Luis Abed, RN Date/Time (Eastern Time): 08/24/2020 3:28:14 PM Caller is currently traveling and does not feel the need to go in for treatment. States "ive been taking dayquill and it works just fine." User: Luis Abed, RN Date/Time (Eastern  Time): 08/24/2020 3:28:44 PM states he is currently traveling home from [place] and will call the office in am for appointment. Referrals GO TO FACILITY REFUSED

## 2020-08-25 NOTE — Telephone Encounter (Signed)
Noted  

## 2020-08-25 NOTE — Telephone Encounter (Signed)
Patient  scheduled a video visit with Dr. Silvio Pate 08/28/20 at 3:15 pm. Patient started with symptoms Sunday, positive for covid.  Dr. Einar Pheasant is out of the office this week.

## 2020-08-25 NOTE — Telephone Encounter (Signed)
Called patient to schedule. He stated he is on his way to urgent care to be assessed in person. He also went ahead and cancelled his appointment for Thursday.

## 2020-08-28 ENCOUNTER — Telehealth: Payer: Medicare Other | Admitting: Internal Medicine

## 2020-09-04 ENCOUNTER — Ambulatory Visit (INDEPENDENT_AMBULATORY_CARE_PROVIDER_SITE_OTHER): Payer: Medicare Other

## 2020-09-04 DIAGNOSIS — Z0000 Encounter for general adult medical examination without abnormal findings: Secondary | ICD-10-CM | POA: Diagnosis not present

## 2020-09-04 NOTE — Patient Instructions (Signed)
Kyle Simon , Thank you for taking time to come for your Medicare Wellness Visit. I appreciate your ongoing commitment to your health goals. Please review the following plan we discussed and let me know if I can assist you in the future.   Screening recommendations/referrals: Colonoscopy: no longer required  Recommended yearly ophthalmology/optometry visit for glaucoma screening and checkup Recommended yearly dental visit for hygiene and checkup  Vaccinations: Influenza vaccine: Up to date, completed 12/05/2019, due 09/2020 Pneumococcal vaccine: Completed series Tdap vaccine: Up to date, completed 07/24/2018, due 06/2028 Shingles vaccine: Completed series   Covid-19: Completed series  Advanced directives: Please bring a copy of your POA (Power of Attorney) and/or Living Will to your next appointment.   Conditions/risks identified: diabetes, hypertension, hyperlipidemia   Next appointment: Follow up in one year for your annual wellness visit.   Preventive Care 12 Years and Older, Male Preventive care refers to lifestyle choices and visits with your health care provider that can promote health and wellness. What does preventive care include? A yearly physical exam. This is also called an annual well check. Dental exams once or twice a year. Routine eye exams. Ask your health care provider how often you should have your eyes checked. Personal lifestyle choices, including: Daily care of your teeth and gums. Regular physical activity. Eating a healthy diet. Avoiding tobacco and drug use. Limiting alcohol use. Practicing safe sex. Taking low doses of aspirin every day. Taking vitamin and mineral supplements as recommended by your health care provider. What happens during an annual well check? The services and screenings done by your health care provider during your annual well check will depend on your age, overall health, lifestyle risk factors, and family history of disease. Counseling   Your health care provider may ask you questions about your: Alcohol use. Tobacco use. Drug use. Emotional well-being. Home and relationship well-being. Sexual activity. Eating habits. History of falls. Memory and ability to understand (cognition). Work and work Statistician. Screening  You may have the following tests or measurements: Height, weight, and BMI. Blood pressure. Lipid and cholesterol levels. These may be checked every 5 years, or more frequently if you are over 22 years old. Skin check. Lung cancer screening. You may have this screening every year starting at age 33 if you have a 30-pack-year history of smoking and currently smoke or have quit within the past 15 years. Fecal occult blood test (FOBT) of the stool. You may have this test every year starting at age 69. Flexible sigmoidoscopy or colonoscopy. You may have a sigmoidoscopy every 5 years or a colonoscopy every 10 years starting at age 46. Prostate cancer screening. Recommendations will vary depending on your family history and other risks. Hepatitis C blood test. Hepatitis B blood test. Sexually transmitted disease (STD) testing. Diabetes screening. This is done by checking your blood sugar (glucose) after you have not eaten for a while (fasting). You may have this done every 1-3 years. Abdominal aortic aneurysm (AAA) screening. You may need this if you are a current or former smoker. Osteoporosis. You may be screened starting at age 73 if you are at high risk. Talk with your health care provider about your test results, treatment options, and if necessary, the need for more tests. Vaccines  Your health care provider may recommend certain vaccines, such as: Influenza vaccine. This is recommended every year. Tetanus, diphtheria, and acellular pertussis (Tdap, Td) vaccine. You may need a Td booster every 10 years. Zoster vaccine. You may need this  after age 70. Pneumococcal 13-valent conjugate (PCV13) vaccine.  One dose is recommended after age 35. Pneumococcal polysaccharide (PPSV23) vaccine. One dose is recommended after age 24. Talk to your health care provider about which screenings and vaccines you need and how often you need them. This information is not intended to replace advice given to you by your health care provider. Make sure you discuss any questions you have with your health care provider. Document Released: 03/14/2015 Document Revised: 11/05/2015 Document Reviewed: 12/17/2014 Elsevier Interactive Patient Education  2017 [place] Prevention in the Home Falls can cause injuries. They can happen to people of all ages. There are many things you can do to make your home safe and to help prevent falls. What can I do on the outside of my home? Regularly fix the edges of walkways and driveways and fix any cracks. Remove anything that might make you trip as you walk through a door, such as a raised step or threshold. Trim any bushes or trees on the path to your home. Use bright outdoor lighting. Clear any walking paths of anything that might make someone trip, such as rocks or tools. Regularly check to see if handrails are loose or broken. Make sure that both sides of any steps have handrails. Any raised decks and porches should have guardrails on the edges. Have any leaves, snow, or ice cleared regularly. Use sand or salt on walking paths during winter. Clean up any spills in your garage right away. This includes oil or grease spills. What can I do in the bathroom? Use night lights. Install grab bars by the toilet and in the tub and shower. Do not use towel bars as grab bars. Use non-skid mats or decals in the tub or shower. If you need to sit down in the shower, use a plastic, non-slip stool. Keep the floor dry. Clean up any water that spills on the floor as soon as it happens. Remove soap buildup in the tub or shower regularly. Attach bath mats securely with double-sided  non-slip rug tape. Do not have throw rugs and other things on the floor that can make you trip. What can I do in the bedroom? Use night lights. Make sure that you have a light by your bed that is easy to reach. Do not use any sheets or blankets that are too big for your bed. They should not hang down onto the floor. Have a firm chair that has side arms. You can use this for support while you get dressed. Do not have throw rugs and other things on the floor that can make you trip. What can I do in the kitchen? Clean up any spills right away. Avoid walking on wet floors. Keep items that you use a lot in easy-to-reach places. If you need to reach something above you, use a strong step stool that has a grab bar. Keep electrical cords out of the way. Do not use floor polish or wax that makes floors slippery. If you must use wax, use non-skid floor wax. Do not have throw rugs and other things on the floor that can make you trip. What can I do with my stairs? Do not leave any items on the stairs. Make sure that there are handrails on both sides of the stairs and use them. Fix handrails that are broken or loose. Make sure that handrails are as long as the stairways. Check any carpeting to make sure that it is firmly attached to the  stairs. Fix any carpet that is loose or worn. Avoid having throw rugs at the top or bottom of the stairs. If you do have throw rugs, attach them to the floor with carpet tape. Make sure that you have a light switch at the top of the stairs and the bottom of the stairs. If you do not have them, ask someone to add them for you. What else can I do to help prevent falls? Wear shoes that: Do not have high heels. Have rubber bottoms. Are comfortable and fit you well. Are closed at the toe. Do not wear sandals. If you use a stepladder: Make sure that it is fully opened. Do not climb a closed stepladder. Make sure that both sides of the stepladder are locked into place. Ask  someone to hold it for you, if possible. Clearly mark and make sure that you can see: Any grab bars or handrails. First and last steps. Where the edge of each step is. Use tools that help you move around (mobility aids) if they are needed. These include: Canes. Walkers. Scooters. Crutches. Turn on the lights when you go into a dark area. Replace any light bulbs as soon as they burn out. Set up your furniture so you have a clear path. Avoid moving your furniture around. If any of your floors are uneven, fix them. If there are any pets around you, be aware of where they are. Review your medicines with your doctor. Some medicines can make you feel dizzy. This can increase your chance of falling. Ask your doctor what other things that you can do to help prevent falls. This information is not intended to replace advice given to you by your health care provider. Make sure you discuss any questions you have with your health care provider. Document Released: 12/12/2008 Document Revised: 07/24/2015 Document Reviewed: 03/22/2014 Elsevier Interactive Patient Education  2017 [employer].

## 2020-09-04 NOTE — Progress Notes (Signed)
PCP notes:  Health Maintenance: Foot exam- due   Abnormal Screenings: none   Patient concerns: none   Nurse concerns: none   Next PCP appt.: none

## 2020-09-04 NOTE — Progress Notes (Signed)
Subjective:   Kyle Simon is a 87 y.o. male who presents for Medicare Annual/Subsequent preventive examination.  Review of Systems: N/A    I connected with patient today by a video enabled telemedicine application and verified that I am speaking with the correct person using two identifiers.  Location patient: home  Location nurse: work  Persons participating in the virtual visit: patient, nurse     I discussed the limitations, risks, security, and privacy concerns of performing an evaluation and management service by telephone and the availability of in person appointments. The patient expressed understanding and agreed to proceed.  Cardiac Risk Factors include: advanced age (>30mn, >>46women);diabetes mellitus;hypertension;male gender;Other (see comment), Risk factor comments: hyperlipidemia     Objective:    There were no vitals filed for this visit. There is no height or weight on file to calculate BMI.  Advanced Directives 09/04/2020 11/03/2017 10/25/2017 11/16/2016 09/15/2016 07/25/2015 10/11/2013  Does Patient Have a Medical Advance Directive? Yes Yes Yes Yes Yes Yes Yes  Type of AParamedicof AMary EstherLiving will - HLibertyLiving will Living will HM[place]argelLiving will [place]Living will [place]Living will  Does patient want to make changes to medical advance directive? - - No - Patient declined No - Patient declined - No - Patient declined No - Patient declined  Copy of [place] Chart? No - copy requested - Yes - - - Yes  Pre-existing out of facility DNR order (yellow form or pink MOST form) - - - - - - -    Current Medications (verified) Outpatient Encounter Medications as of 09/04/2020  Medication Sig   acetaminophen (TYLENOL) 500 MG tablet Take 1,000 mg by mouth every 6 (six) hours as needed for mild pain. Takes 2 tablets nightly   aspirin 81  MG tablet Take 81 mg by mouth every evening.    blood glucose meter kit and supplies KIT Inject 1 each into the skin as directed. Dispense based on patient and insurance preference. Use up to four times daily as directed. (FOR ICD-9 250.00, 250.01).   Calcium Carb-Cholecalciferol (CALCIUM 600 + D PO) Take 1 tablet by mouth daily.   celecoxib (CELEBREX) 200 MG capsule Take by mouth 2 (two) times daily.   diclofenac Sodium (VOLTAREN) 1 % GEL Apply topically.   glucose blood (PRECISION XTRA TEST STRIPS) test strip USE TO CHECK BLOOD SUGAR ONCE DAILY DX E11.9   Hypromellose (ARTIFICIAL TEARS OP) Place 1 drop into both eyes 2 (two) times daily.   LANCETS ULTRA THIN 30G MISC Patient is to use to check blood glucose in the AM fasting and 2 hours after largest meal.   levothyroxine (SYNTHROID) 150 MCG tablet Take 1 tablet (150 mcg total) by mouth daily before breakfast. Take 1.5 tablet (225 mcg) once a week   losartan (COZAAR) 50 MG tablet Take 1 tablet (50 mg total) by mouth daily.   Multiple Vitamin (MULTIVITAMIN WITH MINERALS) TABS Take 1 tablet by mouth daily.   NONFORMULARY OR COMPOUNDED ITEM Glucometer (Precision XTRA- Model #[id number]   Omega-3 Fatty Acids (FISH OIL) 1000 MG CAPS Take 1,000 mg by mouth every evening.   Probiotic Product (RESTORA) CAPS One tab qd (Patient taking differently: Take 1 capsule by mouth every evening.)   rosuvastatin (CRESTOR) 5 MG tablet Take 1 tablet (5 mg total) by mouth at bedtime.   tamsulosin (FLOMAX) 0.4 MG CAPS capsule Take 0.4 mg by  mouth 2 (two) times daily.   vitamin B-12 (CYANOCOBALAMIN) 1000 MCG tablet Take 1,000 mcg by mouth daily.   Methylcellulose, Laxative, (CITRUCEL PO) Take by mouth at bedtime.   No facility-administered encounter medications on file as of 09/04/2020.    Allergies (verified) Simvastatin, Other, and Oxycodone   History: Past Medical History:  Diagnosis Date   Arthritis    both hands;Dr.Deveshwar   Constipation due to pain  medication    Diabetes mellitus without complication (HCC)    Type II. Uses no medication. Pt controls with diet and weight   Enlarged prostate    Frequent urination at night    GERD (gastroesophageal reflux disease)    History of noncompliance with medical treatment, presenting hazards to health 11/20/2018   Hyperlipidemia    Hyperlipidemia associated with type 2 diabetes mellitus ([place]) 04/19/2007   Qualifier: Diagnosis of  By: Linna Darner MD, William     Hypertension    Hypothyroidism    Polio    as a child w/o complications   Snoring    no sleep apnea   Statin declined-  11/20/2018   patient understands risks associated with this decision and has been advised to restart by cardiologist as well as myself several times   Torn meniscus    Transfusion history 2012   post TKR   Past Surgical History:  Procedure Laterality Date   ANTERIOR LAT LUMBAR FUSION Right 11/03/2017   Procedure: Right Lumbar three-four Anterolateral lumbar interbody fusion with lateral plate;  Surgeon: Erline Levine, MD;  Location: [facility];  Service: Neurosurgery;  Laterality: Right;   BACK SURGERY  2011   hemiarthroplasty 01/11/1999 and 6   CARDIAC CATHETERIZATION  09/2001   negative   COLONOSCOPY     EYE SURGERY  2003   R&L cataract removed & IOL- 2003 & 2007,Dr.Jenkins   INGUINAL HERNIA REPAIR  2002   right, Dr. Truitt Leep   JOINT REPLACEMENT  2012   left knee replacement   LIPOMA EXCISION     back   MENISCECTOMY Bilateral    Dr. Noemi Chapel   PROSTATE SURGERY  07/2006   for urinary urgency, Dr. Everardo Pacific CUFF REPAIR Right October, 2016   sinus & throat surgery-1995  1995   TONSILLECTOMY     TOTAL KNEE ARTHROPLASTY  01/11/2011   Procedure: TOTAL KNEE ARTHROPLASTY;  Surgeon: Rudean Haskell, MD;  Location: [facility];  Service: Orthopedics;  Laterality: Left;   TOTAL KNEE ARTHROPLASTY Right 11/22/2016   Procedure: TOTAL KNEE ARTHROPLASTY;  Surgeon: Vickey Huger, MD;  Location: [facility];  Service: Orthopedics;   Laterality: Right;   UVULECTOMY  2863   Dr. Erik Obey   [place]   fracture- right   Family History  Problem Relation Age of Onset   Sudden death Father        accident   Coronary artery disease Mother    Emphysema Mother    Diverticulosis Mother    Thyroid disease Mother    Asthma Brother    Bone cancer Brother    Diabetes Paternal Grandmother    Sarcoidosis Son    Thyroid disease Daughter    Anesthesia problems Neg Hx    Hypotension Neg Hx    Malignant hyperthermia Neg Hx    Pseudochol deficiency Neg Hx    Colon cancer Neg Hx    Social History   Socioeconomic History   Marital status: Married    Spouse name: June  Number of children: 7   Years of education: 2 years college   Highest education level: Not on file  Occupational History   Not on file  Tobacco Use   Smoking status: Former    Packs/day: 1.00    Years: 18.00    Pack years: 18.00    Types: Cigarettes    Quit date: 03/01/1966    Years since quitting: 54.5   Smokeless tobacco: Never  Vaping Use   Vaping Use: Never used  Substance and Sexual Activity   Alcohol use: Yes    Alcohol/week: 4.0 standard drinks    Types: 4 Shots of liquor per week    Comment: social, few drinks a week   Drug use: No   Sexual activity: Yes    Birth control/protection: Post-menopausal, Surgical  Other Topics Concern   Not on file  Social History Narrative   09/20/19   From: [place]   Living: with wife June (1985)   Work: retired from Social research officer, government, 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 [place]      Family: 5 children (one passed away) 2 stepchildren, 14 grandchildren, 83 great-grandchildren - Psychiatrist and granddaughter nearby otherwise across the country       Enjoys: fly and play golf, reading      Exercise: walking the dog   Diet: diabetic diet      Safety   Seat belts: Yes    Guns: Yes  and secure   Safe in relationships: Yes    Social Determinants of Adult nurse Strain: Low Risk    Difficulty of Paying Living Expenses: Not hard at all  Food Insecurity: No Food Insecurity   Worried About Charity fundraiser in the Last Year: Never true   Arboriculturist in the Last Year: Never true  Transportation Needs: No Transportation Needs   Lack of Transportation (Medical): No   Lack of Transportation (Non-Medical): No  Physical Activity: Insufficiently Active   Days of Exercise per Week: 4 days   Minutes of Exercise per Session: 20 min  Stress: No Stress Concern Present   Feeling of Stress : Not at all  Social Connections: Not on file    Tobacco Counseling Counseling given: Not Answered   Clinical Intake:  Pre-visit preparation completed: Yes  Pain : No/denies pain     Nutritional Risks: None Diabetes: Yes CBG done?: No Did pt. bring in CBG monitor from home?: No  How often do you need to have someone help you when you read instructions, pamphlets, or other written materials from your doctor or pharmacy?: 1 - Never  Diabetic: Yes Nutrition Risk Assessment:  Has the patient had any N/V/D within the last 2 months?  No  Does the patient have any non-healing wounds?  No  Has the patient had any unintentional weight loss or weight gain?  No   Diabetes:  Is the patient diabetic?  Yes  If diabetic, was a CBG obtained today?  No  virtual visit  Did the patient bring in their glucometer from home?  No  virtual visit How often do you monitor your CBG's? daily.   Financial Strains and Diabetes Management:  Are you having any financial strains with the device, your supplies or your medication? No .  Does the patient want to be seen by Chronic Care Management for management of their diabetes?  No  Would the patient like to be referred to a Nutritionist or for Diabetic Management?  No   Diabetic Exams:  Diabetic Eye Exam: Completed 03/07/2020 Diabetic Foot Exam: Overdue, Pt has been advised about the importance in  completing this exam.    Interpreter Needed?: No  Information entered by :: CJohnson, LPN   Activities of Daily Living In your present state of health, do you have any difficulty performing the following activities: 09/04/2020  Hearing? N  Vision? N  Difficulty concentrating or making decisions? N  Walking or climbing stairs? N  Dressing or bathing? N  Doing errands, shopping? N  Preparing Food and eating ? N  Using the Toilet? N  In the past six months, have you accidently leaked urine? N  Do you have problems with loss of bowel control? N  Managing your Medications? N  Managing your Finances? N  Housekeeping or managing your Housekeeping? N  Some recent data might be hidden    Patient Care Team: Lesleigh Noe, MD as PCP - General (Family Medicine) Minus Breeding, MD as PCP - Cardiology (Cardiology) Rana Snare, MD (Inactive) as Consulting Physician (Urology) Lavonna Monarch, MD as Consulting Physician (Dermatology) Jodi Marble, MD as Consulting Physician (Otolaryngology) Shirley Muscat Loreen Freud, MD as Referring Physician (Optometry) Inocencio Homes, DPM as Consulting Physician (Podiatry) Bo Merino, MD as Consulting Physician (Rheumatology) Vickey Huger, MD as Consulting Physician (Orthopedic Surgery) Erline Levine, MD as Consulting Physician (Neurosurgery) Deboraha Sprang, MD as Consulting Physician (Cardiology) Elayne Snare, MD as Consulting Physician (Endocrinology) Kathrynn Ducking, MD as Consulting Physician (Neurology) Jodi Marble, MD as Consulting Physician (Otolaryngology) Juanita Craver, MD as Consulting Physician (Gastroenterology) Milus Banister, MD as Attending Physician (Gastroenterology)  Indicate any recent Medical Services you may have received from other than [facility] providers in the past year (date may be approximate).     Assessment:   This is a routine wellness examination for Soren.  Hearing/Vision screen Vision Screening - Comments::  Patient gets annual eye exams   Dietary issues and exercise activities discussed: Current Exercise Habits: Home exercise routine, Type of exercise: walking, Time (Minutes): 20, Frequency (Times/Week): 4, Weekly Exercise (Minutes/Week): 80, Intensity: Moderate, Exercise limited by: None identified   Goals Addressed             This Visit's Progress    Patient Stated       09/04/2020, I will continue to walk 3-4 days a week for about 20 minutes.         Depression Screen PHQ 2/9 Scores 09/04/2020 09/20/2019 06/06/2019 03/21/2019 02/13/2019 11/15/2018 04/12/2018  PHQ - 2 Score 0 0 0 0 0 0 0  PHQ- 9 Score 0 1 1 1  0 0 0    Fall Risk Fall Risk  09/04/2020 02/05/2020 07/04/2018 04/12/2018 05/18/2017  Falls in the past year? 0 0 0 0 No  Number falls in past yr: 0 0 - - -  Injury with Fall? 0 - - - -  Risk for fall due to : Medication side effect - - - -  Follow up Falls evaluation completed;Falls prevention discussed - Falls evaluation completed Falls evaluation completed -    FALL RISK PREVENTION PERTAINING TO THE HOME:  Any stairs in or around the home? Yes  If so, are there any without handrails? No  Home free of loose throw rugs in walkways, pet beds, electrical cords, etc? Yes  Adequate lighting in your home to reduce risk of falls? Yes   ASSISTIVE DEVICES UTILIZED TO PREVENT FALLS:  Life alert? No  Use of a cane, walker or w/c?  No  Grab bars in the bathroom? No  Shower chair or bench in shower? No  Elevated toilet seat or a handicapped toilet? No   TIMED UP AND GO:  Was the test performed?  N/A virtual visit .  Cognitive Function: MMSE - Mini Mental State Exam 09/04/2020  Orientation to time 5  Orientation to Place 5  Registration 3  Attention/ Calculation 5  Recall 3  Language- repeat 1  Mini Cog  Mini-Cog screen was completed. Maximum score is 22. A value of 0 denotes this part of the MMSE was not completed or the patient failed this part of the Mini-Cog screening.     6CIT Screen 07/04/2018 05/18/2017  What Year? 0 points 0 points  What month? 0 points 0 points  What time? 0 points 0 points  Count back from 20 0 points 0 points  Months in reverse 0 points 0 points  Repeat phrase 0 points 0 points  Total Score 0 0    Immunizations Immunization History  Administered Date(s) Administered   Fluad Quad(high Dose 65+) 12/05/2019   Influenza Whole 01/09/2002   Influenza, High Dose Seasonal PF 11/24/2018, 02/08/2019   Influenza,inj,Quad PF,6+ Mos 11/29/2012, 11/29/2013   Influenza-Unspecified 11/30/2014, 11/22/2015, 01/12/2018   MMR 07/12/2017   PFIZER(Purple Top)SARS-COV-2 Vaccination 03/11/2019, 04/01/2019, 11/07/2019, 06/18/2020   Pneumococcal Conjugate-13 03/20/2014   Pneumococcal Polysaccharide-23 03/30/1996, 02/13/2010   Td 03/30/1996, 04/26/2007   Tdap 07/24/2018   Zoster Recombinat (Shingrix) 07/24/2018, 11/24/2018, 02/08/2019    TDAP status: Up to date  Flu Vaccine status: Up to date  Pneumococcal vaccine status: Up to date  Covid-19 vaccine status: Completed vaccines  Qualifies for Shingles Vaccine? Yes   Zostavax completed No   Shingrix Completed?: Yes  Screening Tests Health Maintenance  Topic Date Due   FOOT EXAM  06/03/2020   INFLUENZA VACCINE  09/29/2020   COVID-19 Vaccine (5 - Booster for Pfizer series) 10/18/2020   HEMOGLOBIN A1C  11/02/2020   OPHTHALMOLOGY EXAM  03/07/2021   TETANUS/TDAP  07/23/2028   PNA vac Low Risk Adult  Completed   Zoster Vaccines- Shingrix  Completed   HPV VACCINES  Aged Out   COLONOSCOPY (Pts 45-5yr Insurance coverage will need to be confirmed)  Discontinued    Health Maintenance  Health Maintenance Due  Topic Date Due   FOOT EXAM  06/03/2020    Colorectal cancer screening: No longer required.   Lung Cancer Screening: (Low Dose CT Chest recommended if Age 85-80years, 30 pack-year currently smoking OR have quit w/in 15years.) does not qualify.   Additional Screening:  Hepatitis C  Screening: does not qualify; Completed N/A  Vision Screening: Recommended annual ophthalmology exams for early detection of glaucoma and other disorders of the eye. Is the patient up to date with their annual eye exam?  Yes  Who is the provider or what is the name of the office in which the patient attends annual eye exams? Dr. MZigmund DanielIf pt is not established with a provider, would they like to be referred to a provider to establish care? No .   Dental Screening: Recommended annual dental exams for proper oral hygiene  Community Resource Referral / Chronic Care Management: CRR required this visit?  No   CCM required this visit?  No      Plan:     I have personally reviewed and noted the following in the patient's chart:   Medical and social history Use of alcohol, tobacco or illicit drugs  Current medications and  supplements including opioid prescriptions. Patient is not currently taking opioid prescriptions. Functional ability and status Nutritional status Physical activity Advanced directives List of other physicians Hospitalizations, surgeries, and ER visits in previous 12 months Vitals Screenings to include cognitive, depression, and falls Referrals and appointments  In addition, I have reviewed and discussed with patient certain preventive protocols, quality metrics, and best practice recommendations. A written personalized care plan for preventive services as well as general preventive health recommendations were provided to patient.   Due to this being a telephonic visit, the after visit summary with patients personalized plan was offered to patient via office or my-chart. Patient preferred to pick up at office at next visit or via mychart.   Andrez Grime, LPN   10/02/7205

## 2020-09-06 ENCOUNTER — Inpatient Hospital Stay: Payer: Medicare Other

## 2020-09-06 ENCOUNTER — Other Ambulatory Visit: Payer: Self-pay

## 2020-09-06 ENCOUNTER — Inpatient Hospital Stay
Admission: EM | Admit: 2020-09-06 | Discharge: 2020-09-09 | DRG: 336 | Disposition: A | Discharge status: Home / Self Care | Payer: Medicare Other | Attending: Internal Medicine | Admitting: Internal Medicine

## 2020-09-06 ENCOUNTER — Emergency Department: Payer: Medicare Other

## 2020-09-06 ENCOUNTER — Encounter: Payer: Self-pay | Admitting: Emergency Medicine

## 2020-09-06 DIAGNOSIS — G8929 Other chronic pain: Secondary | ICD-10-CM | POA: Diagnosis present

## 2020-09-06 DIAGNOSIS — Z452 Encounter for adjustment and management of vascular access device: Secondary | ICD-10-CM | POA: Diagnosis not present

## 2020-09-06 DIAGNOSIS — D62 Acute posthemorrhagic anemia: Secondary | ICD-10-CM | POA: Diagnosis not present

## 2020-09-06 DIAGNOSIS — Z8249 Family history of ischemic heart disease and other diseases of the circulatory system: Secondary | ICD-10-CM

## 2020-09-06 DIAGNOSIS — R140 Abdominal distension (gaseous): Secondary | ICD-10-CM | POA: Diagnosis not present

## 2020-09-06 DIAGNOSIS — K219 Gastro-esophageal reflux disease without esophagitis: Secondary | ICD-10-CM | POA: Diagnosis present

## 2020-09-06 DIAGNOSIS — N401 Enlarged prostate with lower urinary tract symptoms: Secondary | ICD-10-CM | POA: Diagnosis present

## 2020-09-06 DIAGNOSIS — K56609 Unspecified intestinal obstruction, unspecified as to partial versus complete obstruction: Secondary | ICD-10-CM

## 2020-09-06 DIAGNOSIS — Z825 Family history of asthma and other chronic lower respiratory diseases: Secondary | ICD-10-CM

## 2020-09-06 DIAGNOSIS — E861 Hypovolemia: Secondary | ICD-10-CM | POA: Diagnosis present

## 2020-09-06 DIAGNOSIS — Z981 Arthrodesis status: Secondary | ICD-10-CM

## 2020-09-06 DIAGNOSIS — K5652 Intestinal adhesions [bands] with complete obstruction: Secondary | ICD-10-CM | POA: Diagnosis not present

## 2020-09-06 DIAGNOSIS — Z66 Do not resuscitate: Secondary | ICD-10-CM | POA: Diagnosis present

## 2020-09-06 DIAGNOSIS — E871 Hypo-osmolality and hyponatremia: Secondary | ICD-10-CM | POA: Diagnosis present

## 2020-09-06 DIAGNOSIS — K4020 Bilateral inguinal hernia, without obstruction or gangrene, not specified as recurrent: Secondary | ICD-10-CM

## 2020-09-06 DIAGNOSIS — Z0189 Encounter for other specified special examinations: Secondary | ICD-10-CM

## 2020-09-06 DIAGNOSIS — Z8349 Family history of other endocrine, nutritional and metabolic diseases: Secondary | ICD-10-CM | POA: Diagnosis not present

## 2020-09-06 DIAGNOSIS — I129 Hypertensive chronic kidney disease with stage 1 through stage 4 chronic kidney disease, or unspecified chronic kidney disease: Secondary | ICD-10-CM | POA: Diagnosis present

## 2020-09-06 DIAGNOSIS — I10 Essential (primary) hypertension: Secondary | ICD-10-CM | POA: Diagnosis not present

## 2020-09-06 DIAGNOSIS — R911 Solitary pulmonary nodule: Secondary | ICD-10-CM | POA: Diagnosis not present

## 2020-09-06 DIAGNOSIS — E875 Hyperkalemia: Secondary | ICD-10-CM | POA: Diagnosis not present

## 2020-09-06 DIAGNOSIS — N179 Acute kidney failure, unspecified: Secondary | ICD-10-CM | POA: Diagnosis present

## 2020-09-06 DIAGNOSIS — Z4682 Encounter for fitting and adjustment of non-vascular catheter: Secondary | ICD-10-CM | POA: Diagnosis not present

## 2020-09-06 DIAGNOSIS — J9811 Atelectasis: Secondary | ICD-10-CM | POA: Diagnosis not present

## 2020-09-06 DIAGNOSIS — E860 Dehydration: Secondary | ICD-10-CM | POA: Diagnosis present

## 2020-09-06 DIAGNOSIS — Z9889 Other specified postprocedural states: Secondary | ICD-10-CM | POA: Diagnosis not present

## 2020-09-06 DIAGNOSIS — E1122 Type 2 diabetes mellitus with diabetic chronic kidney disease: Secondary | ICD-10-CM | POA: Diagnosis present

## 2020-09-06 DIAGNOSIS — R1110 Vomiting, unspecified: Secondary | ICD-10-CM | POA: Diagnosis not present

## 2020-09-06 DIAGNOSIS — N182 Chronic kidney disease, stage 2 (mild): Secondary | ICD-10-CM | POA: Diagnosis present

## 2020-09-06 DIAGNOSIS — E785 Hyperlipidemia, unspecified: Secondary | ICD-10-CM | POA: Diagnosis present

## 2020-09-06 DIAGNOSIS — K8020 Calculus of gallbladder without cholecystitis without obstruction: Secondary | ICD-10-CM | POA: Diagnosis present

## 2020-09-06 DIAGNOSIS — K5730 Diverticulosis of large intestine without perforation or abscess without bleeding: Secondary | ICD-10-CM | POA: Diagnosis not present

## 2020-09-06 DIAGNOSIS — K6389 Other specified diseases of intestine: Secondary | ICD-10-CM | POA: Diagnosis not present

## 2020-09-06 DIAGNOSIS — R110 Nausea: Secondary | ICD-10-CM | POA: Diagnosis not present

## 2020-09-06 DIAGNOSIS — Z833 Family history of diabetes mellitus: Secondary | ICD-10-CM | POA: Diagnosis not present

## 2020-09-06 DIAGNOSIS — K469 Unspecified abdominal hernia without obstruction or gangrene: Secondary | ICD-10-CM | POA: Diagnosis not present

## 2020-09-06 DIAGNOSIS — Z96653 Presence of artificial knee joint, bilateral: Secondary | ICD-10-CM | POA: Diagnosis present

## 2020-09-06 DIAGNOSIS — R109 Unspecified abdominal pain: Secondary | ICD-10-CM | POA: Diagnosis not present

## 2020-09-06 DIAGNOSIS — K565 Intestinal adhesions [bands], unspecified as to partial versus complete obstruction: Secondary | ICD-10-CM | POA: Diagnosis not present

## 2020-09-06 DIAGNOSIS — E222 Syndrome of inappropriate secretion of antidiuretic hormone: Secondary | ICD-10-CM | POA: Diagnosis present

## 2020-09-06 DIAGNOSIS — R112 Nausea with vomiting, unspecified: Secondary | ICD-10-CM | POA: Diagnosis not present

## 2020-09-06 DIAGNOSIS — I517 Cardiomegaly: Secondary | ICD-10-CM | POA: Diagnosis not present

## 2020-09-06 DIAGNOSIS — E039 Hypothyroidism, unspecified: Secondary | ICD-10-CM | POA: Diagnosis present

## 2020-09-06 DIAGNOSIS — N183 Chronic kidney disease, stage 3 unspecified: Secondary | ICD-10-CM | POA: Diagnosis present

## 2020-09-06 LAB — SURGICAL PCR SCREEN
MRSA, PCR: NEGATIVE
Staphylococcus aureus: NEGATIVE

## 2020-09-06 LAB — LIPASE, BLOOD: Lipase: 27 U/L (ref 11–51)

## 2020-09-06 LAB — CBC
HCT: 43.9 % (ref 39.0–52.0)
Hemoglobin: 14.7 g/dL (ref 13.0–17.0)
MCH: 30.2 pg (ref 26.0–34.0)
MCHC: 33.5 g/dL (ref 30.0–36.0)
MCV: 90.3 fL (ref 80.0–100.0)
Platelets: 364 10*3/uL (ref 150–400)
RBC: 4.86 MIL/uL (ref 4.22–5.81)
RDW: 12.6 % (ref 11.5–15.5)
WBC: 16.4 10*3/uL — ABNORMAL HIGH (ref 4.0–10.5)
nRBC: 0 % (ref 0.0–0.2)

## 2020-09-06 LAB — TROPONIN I (HIGH SENSITIVITY): Troponin I (High Sensitivity): 8 ng/L (ref <18)

## 2020-09-06 LAB — COMPREHENSIVE METABOLIC PANEL
ALT: 18 U/L (ref 0–44)
AST: 24 U/L (ref 15–41)
Albumin: 4.3 g/dL (ref 3.5–5.0)
Alkaline Phosphatase: 75 U/L (ref 38–126)
Anion gap: 13 (ref 5–15)
BUN: 29 mg/dL — ABNORMAL HIGH (ref 8–23)
CO2: 23 mmol/L (ref 22–32)
Calcium: 9.4 mg/dL (ref 8.9–10.3)
Chloride: 89 mmol/L — ABNORMAL LOW (ref 98–111)
Creatinine, Ser: 1.16 mg/dL (ref 0.61–1.24)
GFR, Estimated: >60 mL/min (ref >60)
Glucose, Bld: 187 mg/dL — ABNORMAL HIGH (ref 70–99)
Potassium: 4.7 mmol/L (ref 3.5–5.1)
Sodium: 125 mmol/L — ABNORMAL LOW (ref 135–145)
Total Bilirubin: 1.7 mg/dL — ABNORMAL HIGH (ref 0.3–1.2)
Total Protein: 8.1 g/dL (ref 6.5–8.1)

## 2020-09-06 LAB — GLUCOSE, CAPILLARY: Glucose-Capillary: 128 mg/dL — ABNORMAL HIGH (ref 70–99)

## 2020-09-06 MED ORDER — PANTOPRAZOLE SODIUM 40 MG IV SOLR
40.0000 mg | INTRAVENOUS | Status: DC
  Administered 2020-09-08: 40 mg via INTRAVENOUS
  Filled 2020-09-06 (×2): qty 40

## 2020-09-06 MED ORDER — ONDANSETRON 4 MG PO TBDP
4.0000 mg | ORAL_TABLET | Freq: Once | ORAL | Status: AC | PRN
  Administered 2020-09-06: 4 mg via ORAL
  Filled 2020-09-06: qty 1

## 2020-09-06 MED ORDER — PHENOL 1.4 % MT LIQD
1.0000 | Freq: Three times a day (TID) | OROMUCOSAL | Status: DC | PRN
  Filled 2020-09-06: qty 177

## 2020-09-06 MED ORDER — CHLORHEXIDINE GLUCONATE CLOTH 2 % EX PADS
6.0000 | MEDICATED_PAD | Freq: Once | CUTANEOUS | Status: AC
  Administered 2020-09-07: 6 via TOPICAL

## 2020-09-06 MED ORDER — IOHEXOL 350 MG/ML SOLN
75.0000 mL | Freq: Once | INTRAVENOUS | Status: AC | PRN
  Administered 2020-09-06: 75 mL via INTRAVENOUS

## 2020-09-06 MED ORDER — ONDANSETRON HCL 4 MG/2ML IJ SOLN: 4.0000 mg | Freq: Four times a day (QID) | INTRAMUSCULAR | Status: DC | PRN

## 2020-09-06 MED ORDER — CHLORHEXIDINE GLUCONATE CLOTH 2 % EX PADS: 6.0000 | MEDICATED_PAD | Freq: Once | CUTANEOUS | Status: DC

## 2020-09-06 MED ORDER — ONDANSETRON HCL 4 MG PO TABS: 4.0000 mg | ORAL_TABLET | Freq: Four times a day (QID) | ORAL | Status: DC | PRN

## 2020-09-06 MED ORDER — SODIUM CHLORIDE 0.9 % IV SOLN: Freq: Once | INTRAVENOUS | Status: AC

## 2020-09-06 MED ORDER — DIATRIZOATE MEGLUMINE & SODIUM 66-10 % PO SOLN: 90.0000 mL | Freq: Once | ORAL | Status: DC

## 2020-09-06 MED ORDER — CEFAZOLIN SODIUM-DEXTROSE 2-4 GM/100ML-% IV SOLN
2.0000 g | INTRAVENOUS | Status: AC
  Administered 2020-09-07: 2 g via INTRAVENOUS

## 2020-09-06 MED ORDER — SODIUM CHLORIDE 0.9 % IV BOLUS: 1000.0000 mL | Freq: Once | INTRAVENOUS | Status: DC

## 2020-09-06 MED ORDER — SODIUM CHLORIDE 0.9 % IV SOLN: INTRAVENOUS | Status: DC

## 2020-09-06 NOTE — ED Provider Notes (Signed)
CT scan consistent with SBO. Discussed with Dr. Dahlia Byes with surgery. Recommended NG tube and admission to hospitalist service.    Nance Pear, MD 09/06/20 1351

## 2020-09-06 NOTE — Plan of Care (Signed)
  Problem: Health Behavior/Discharge Planning: Goal: Ability to manage health-related needs will improve Outcome: Not Progressing   Problem: Education: Goal: Knowledge of General Education information will improve Description: Including pain rating scale, medication(s)/side effects and non-pharmacologic comfort measures Outcome: Not Progressing   Problem: Clinical Measurements: Goal: Ability to maintain clinical measurements within normal limits will improve Outcome: Not Progressing Goal: Will remain free from infection Outcome: Not Progressing Goal: Diagnostic test results will improve Outcome: Not Progressing Goal: Respiratory complications will improve Outcome: Not Progressing Goal: Cardiovascular complication will be avoided Outcome: Not Progressing   Problem: Activity: Goal: Risk for activity intolerance will decrease Outcome: Not Progressing   Problem: Nutrition: Goal: Adequate nutrition will be maintained Outcome: Not Progressing   Problem: Coping: Goal: Level of anxiety will decrease Outcome: Not Progressing   Problem: Elimination: Goal: Will not experience complications related to bowel motility Outcome: Not Progressing Goal: Will not experience complications related to urinary retention Outcome: Not Progressing   Problem: Pain Managment: Goal: General experience of comfort will improve Outcome: Not Progressing   Problem: Safety: Goal: Ability to remain free from injury will improve Outcome: Not Progressing   Problem: Skin Integrity: Goal: Risk for impaired skin integrity will decrease Outcome: Not Progressing

## 2020-09-06 NOTE — H&P (Signed)
History and Physical    Kyle Simon ZYY:[id number] DOB: [birth year] DOA: 09/06/2020  PCP: Lesleigh Noe, MD   Patient coming from: Home  I have personally briefly reviewed patient's old medical records in [place]  Chief Complaint: Nausea and vomiting  HPI: Kyle Simon is a 87 y.o. male with medical history significant for diet-controlled diabetes mellitus with complications of stage III chronic kidney disease, hypertension, dyslipidemia, hypothyroidism who presents to the ER via private vehicle for evaluation of a 3-day history of nausea and vomiting. Patient states his symptoms started 3 days ago after he ate a cup of clam chowder from his neighbor.  He has had persistent nausea and emesis and has been unable to tolerate any oral intake since then.  He denies having any abdominal pain and his last bowel movement was 1 day prior to his admission.  He usually has daily bowel movements.  He is not able to pass flatus.  Due to the persistence of his symptoms he decided to come to the emergency room to get checked. He denies having any hematemesis, no hematochezia or melena stools, no fever, no chills, no cough, no dizziness, no lightheadedness, no headache, no focal deficits, no palpitations or diaphoresis. CT angiogram of the abdomen and pelvis shows findings worrisome for high-grade small bowel obstruction with transition site located within the right mid hemiabdomen, the etiology of which is not depicted on this examination and thus presumably secondary to adhesions. Cholelithiasis without evidence of cholecystitis. Colonic diverticulosis without evidence of superimposed acute diverticulitis.Potential punctate (0.5 cm) left lower lobe nodule versus area of nodular atelectasis/aspiration, not seen on the 10/20 21 examination.  Twelve-lead EKG reviewed by me shows sinus rhythm with PACs    ED Course: Patient is an 87 year old Caucasian male who presents to the ER for evaluation  of 3 days of nausea and vomiting.  Imaging shows high-grade small bowel obstruction with transition site located within the right mid hemiabdomen presumably secondary to adhesions. Labs show hyponatremia and leukocytosis. Patient will be admitted to the hospital for further evaluation.  Review of Systems: As per HPI otherwise all other systems reviewed and negative.    Past Medical History:  Diagnosis Date   Arthritis    both hands;Dr.Deveshwar   Constipation due to pain medication    Diabetes mellitus without complication ([place])    Type II. Uses no medication. Pt controls with diet and weight   Enlarged prostate    Frequent urination at night    GERD (gastroesophageal reflux disease)    History of noncompliance with medical treatment, presenting hazards to health 11/20/2018   Hyperlipidemia    Hyperlipidemia associated with type 2 diabetes mellitus ([place]) 04/19/2007   Qualifier: Diagnosis of  By: Linna Darner MD, William     Hypertension    Hypothyroidism    Polio    as a child w/o complications   Snoring    no sleep apnea   Statin declined-  11/20/2018   patient understands risks associated with this decision and has been advised to restart by cardiologist as well as myself several times   Torn meniscus    Transfusion history 2012   post TKR    Past Surgical History:  Procedure Laterality Date   ANTERIOR LAT LUMBAR FUSION Right 11/03/2017   Procedure: Right Lumbar three-four Anterolateral lumbar interbody fusion with lateral plate;  Surgeon: Erline Levine, MD;  Location: [facility];  Service: Neurosurgery;  Laterality: Right;   BACK SURGERY  2011  hemiarthroplasty 01/11/1999 and 6   CARDIAC CATHETERIZATION  09/2001   negative   COLONOSCOPY     EYE SURGERY  2003   R&L cataract removed & IOL- 2003 & 2007,Dr.Jenkins   INGUINAL HERNIA REPAIR  2002   right, Dr. Truitt Leep   JOINT REPLACEMENT  2012   left knee replacement   LIPOMA EXCISION     back   MENISCECTOMY Bilateral    Dr.  Noemi Chapel   PROSTATE SURGERY  07/2006   for urinary urgency, Dr. Everardo Pacific CUFF REPAIR Right October, 2016   sinus & throat surgery-1995  1995   TONSILLECTOMY     TOTAL KNEE ARTHROPLASTY  01/11/2011   Procedure: TOTAL KNEE ARTHROPLASTY;  Surgeon: Rudean Haskell, MD;  Location: [facility];  Service: Orthopedics;  Laterality: Left;   TOTAL KNEE ARTHROPLASTY Right 11/22/2016   Procedure: TOTAL KNEE ARTHROPLASTY;  Surgeon: Vickey Huger, MD;  Location: [facility];  Service: Orthopedics;  Laterality: Right;   UVULECTOMY  1856   Dr. Erik Obey   [place]   fracture- right     reports that he quit smoking about 54 years ago. His smoking use included cigarettes. He has a 18.00 pack-year smoking history. He has never used smokeless tobacco. He reports current alcohol use of about 4.0 standard drinks of alcohol per week. He reports that he does not use drugs.  Allergies  Allergen Reactions   Simvastatin Other (See Comments)    MYALGIAS   Other     Other reaction(s): Unknown Other reaction(s): Unknown   Oxycodone Other (See Comments)    Severe constipation    Family History  Problem Relation Age of Onset   Sudden death Father        accident   Coronary artery disease Mother    Emphysema Mother    Diverticulosis Mother    Thyroid disease Mother    Asthma Brother    Bone cancer Brother    Diabetes Paternal Grandmother    Sarcoidosis Son    Thyroid disease Daughter    Anesthesia problems Neg Hx    Hypotension Neg Hx    Malignant hyperthermia Neg Hx    Pseudochol deficiency Neg Hx    Colon cancer Neg Hx       Prior to Admission medications   Medication Sig Start Date End Date Taking? Authorizing Provider  acetaminophen (TYLENOL) 500 MG tablet Take 1,000 mg by mouth every 6 (six) hours as needed for mild pain. Takes 2 tablets nightly   Yes [provider]  aspirin 81 MG tablet Take 81 mg by mouth every evening.    Yes [provider]  Calcium  Carb-Cholecalciferol (CALCIUM 600 + D PO) Take 1 tablet by mouth daily.   Yes [provider]  celecoxib (CELEBREX) 200 MG capsule Take 200 mg by mouth daily. 09/18/19  Yes [provider]  diclofenac Sodium (VOLTAREN) 1 % GEL Apply 2 g topically at bedtime. 09/10/19  Yes [provider]  Hypromellose (ARTIFICIAL TEARS OP) Place 1 drop into both eyes 2 (two) times daily.   Yes [provider]  levothyroxine (SYNTHROID) 150 MCG tablet Take 1 tablet (150 mcg total) by mouth daily before breakfast. Take 1.5 tablet (225 mcg) once a week Patient taking differently: Take 150 mcg by mouth daily before breakfast. 08/15/20  Yes Elayne Snare, MD  losartan (COZAAR) 50 MG tablet Take 1 tablet (50 mg total) by mouth daily. 09/20/19  Yes  Lesleigh Noe, MD  Methylcellulose, Laxative, (CITRUCEL PO) Take 1 g by mouth in the morning.   Yes [provider]  Multiple Vitamin (MULTIVITAMIN WITH MINERALS) TABS Take 1 tablet by mouth daily.   Yes [provider]  Omega-3 Fatty Acids (FISH OIL) 1000 MG CAPS Take 1,000 mg by mouth every evening.   Yes [provider]  Probiotic Product (RESTORA) CAPS One tab qd Patient taking differently: Take 1 capsule by mouth every evening. 07/25/15  Yes Opalski, Deborah, DO  rosuvastatin (CRESTOR) 5 MG tablet Take 1 tablet (5 mg total) by mouth at bedtime. 09/20/19  Yes Lesleigh Noe, MD  tamsulosin (FLOMAX) 0.4 MG CAPS capsule Take 0.4 mg by mouth 2 (two) times daily.   Yes [provider]  vitamin B-12 (CYANOCOBALAMIN) 1000 MCG tablet Take 1,000 mcg by mouth daily.   Yes [provider]  blood glucose meter kit and supplies KIT Inject 1 each into the skin as directed. Dispense based on patient and insurance preference. Use up to four times daily as directed. (FOR ICD-9 250.00, 250.01). 02/05/20   Lesleigh Noe, MD  glucose blood (PRECISION XTRA TEST STRIPS) test strip USE TO CHECK BLOOD SUGAR ONCE DAILY DX  E11.9 04/17/15   Midge Minium, MD  LANCETS ULTRA THIN 30G [place] Patient is to use to check blood glucose in the AM fasting and 2 hours after largest meal. 04/14/17   Mellody Dance, DO  NONFORMULARY OR COMPOUNDED ITEM Glucometer (Precision [place]- Model #[id number]) 04/07/18   Mellody Dance, DO    Physical Exam: Vitals:   09/06/20 0654 09/06/20 0727 09/06/20 0836 09/06/20 0930  BP: 129/74 107/73 110/73 118/77  Pulse: 68 68 63 (!) 59  Resp: 16 14 15 10   Temp:  97.9 F (36.6 C)    TempSrc:      SpO2: 96% 96% 95% 99%  Weight:      Height:         Vitals:   09/06/20 0654 09/06/20 0727 09/06/20 0836 09/06/20 0930  BP: 129/74 107/73 110/73 118/77  Pulse: 68 68 63 (!) 59  Resp: 16 14 15 10   Temp:  97.9 F (36.6 C)    TempSrc:      SpO2: 96% 96% 95% 99%  Weight:      Height:          Constitutional: Alert and oriented x 3 . Not in any apparent distress.  Nasogastric tube in place.  Appears comfortable and in no distress. HEENT:      Head: Normocephalic and atraumatic.         Eyes: PERLA, EOMI, Conjunctivae are normal. Sclera is non-icteric.       Mouth/Throat: Mucous membranes are moist.       Neck: Supple with no signs of meningismus. Cardiovascular: Regular rate and rhythm. No murmurs, gallops, or rubs. 2+ symmetrical distal pulses are present . No JVD. No LE edema Respiratory: Respiratory effort normal .Lungs sounds clear bilaterally. No wheezes, crackles, or rhonchi.  Gastrointestinal: Soft, non tender, and non distended with hypoactive bowel sounds.  Genitourinary: No CVA tenderness. Musculoskeletal: Nontender with normal range of motion in all extremities. No cyanosis, or erythema of extremities. Neurologic:  Face is symmetric. Moving all extremities. No gross focal neurologic deficits . Skin: Skin is warm, dry.  No rash or ulcers Psychiatric: Mood and affect are normal    Labs on Admission: I have personally reviewed following labs and imaging  studies  CBC: Recent Labs  Lab 09/06/20 0435  WBC 16.4*  HGB 14.7  HCT 43.9  MCV 90.3  PLT 623   Basic Metabolic Panel: Recent Labs  Lab 09/06/20 0435  NA 125*  K 4.7  CL 89*  CO2 23  GLUCOSE 187*  BUN 29*  CREATININE 1.16  CALCIUM 9.4   GFR: Estimated Creatinine Clearance: 48.1 mL/min (by C-G formula based on SCr of 1.16 mg/dL). Liver Function Tests: Recent Labs  Lab 09/06/20 0435  AST 24  ALT 18  ALKPHOS 75  BILITOT 1.7*  PROT 8.1  ALBUMIN 4.3   Recent Labs  Lab 09/06/20 0435  LIPASE 27   No results for input(s): AMMONIA in the last 168 hours. Coagulation Profile: No results for input(s): INR, PROTIME in the last 168 hours. Cardiac Enzymes: No results for input(s): CKTOTAL, CKMB, CKMBINDEX, TROPONINI in the last 168 hours. BNP (last 3 results) No results for input(s): PROBNP in the last 8760 hours. HbA1C: No results for input(s): HGBA1C in the last 72 hours. CBG: No results for input(s): GLUCAP in the last 168 hours. Lipid Profile: No results for input(s): CHOL, HDL, LDLCALC, TRIG, CHOLHDL, LDLDIRECT in the last 72 hours. Thyroid Function Tests: No results for input(s): TSH, T4TOTAL, FREET4, T3FREE, THYROIDAB in the last 72 hours. Anemia Panel: No results for input(s): VITAMINB12, FOLATE, FERRITIN, TIBC, IRON, RETICCTPCT in the last 72 hours. Urine analysis:    Component Value Date/Time   COLORURINE YELLOW 12/31/2010 1135   APPEARANCEUR CLEAR 12/31/2010 1135   LABSPEC 1.011 12/31/2010 1135   PHURINE 6.0 12/31/2010 1135   GLUCOSEU NEGATIVE 12/31/2010 1135   HGBUR NEGATIVE 12/31/2010 1135   HGBUR negative 04/22/2009 1618   BILIRUBINUR Neg 04/27/2011 1204   KETONESUR NEGATIVE 12/31/2010 1135   PROTEINUR Neg 04/27/2011 1204   PROTEINUR NEGATIVE 12/31/2010 1135   UROBILINOGEN 0.2 04/27/2011 1204   UROBILINOGEN 1.0 12/31/2010 1135   NITRITE Neg 04/27/2011 1204   NITRITE NEGATIVE 12/31/2010 1135   LEUKOCYTESUR Negative 04/27/2011 1204     Radiological Exams on Admission: DG Chest Port 1 View  Result Date: 09/06/2020 CLINICAL DATA:  NG tube placement EXAM: PORTABLE CHEST 1 VIEW COMPARISON:  01/23/2019 FINDINGS: Esophagogastric tube with tip and side port below the diaphragm. No acute abnormality of the lungs. Mild cardiomegaly. IMPRESSION: 1. Esophagogastric tube with tip and side port below the diaphragm. 2. No acute abnormality of the lungs. Mild cardiomegaly. Electronically Signed   By: Eddie Candle M.D.   On: 09/06/2020 12:11   CT Angio Abd/Pel W and/or Wo Contrast  Result Date: 09/06/2020 CLINICAL DATA:  Nausea and vomiting.  Concern for GI bleeding. EXAM: CTA ABDOMEN AND PELVIS WITHOUT AND WITH CONTRAST TECHNIQUE: Multidetector CT imaging of the abdomen and pelvis was performed using the standard protocol during bolus administration of intravenous contrast. Multiplanar reconstructed images and MIPs were obtained and reviewed to evaluate the vascular anatomy. CONTRAST:  79m OMNIPAQUE IOHEXOL 350 MG/ML SOLN COMPARISON:  CT abdomen pelvis-08/28/2018; chest CT-12/06/2019; 10/05/2017 FINDINGS: VASCULAR Aorta: Moderate amount mixed calcified and noncalcified atherosclerotic plaque within normal caliber abdominal aorta, not resulting in a hemodynamically significant stenosis. No evidence of abdominal aortic dissection or periaortic stranding. Celiac: There is a minimal amount of eccentric calcified atherosclerotic plaque involving the cranial aspect of the origin the celiac artery, not resulting in a hemodynamically significant stenosis. Conventional branching pattern. SMA: There is a minimal amount of eccentric calcified atherosclerotic plaque involving the origin of the SMA, not resulting in a hemodynamically significant stenosis. Conventional branching pattern. The distal  tributaries the SMA appear widely patent without discrete lumen filling defect to suggest distal embolism. Renals: There are 3 discrete left-sided renal arteries.  Solitary right renal artery. There is a minimal amount of calcified atherosclerotic plaque involving all renal arteries though not definitely resulting in hemodynamically significant narrowing. No vessel irregularity to suggest FMD. IMA: Remains patent. Inflow: There is a minimal amount of calcified atherosclerotic plaque involving the bilateral common iliac arteries, not resulting in hemodynamically significant stenosis. The bilateral internal and external iliac arteries are tortuous though of normal caliber and widely patent without hemodynamically significant narrowing. Proximal Outflow: There is a minimal amount of eccentric calcified atherosclerotic plaque involving the right common femoral artery, not resulting in hemodynamically significant narrowing. The left common femoral artery is widely patent without hemodynamically significant narrowing. The bilateral deep and superficial femoral arteries are of normal caliber and widely patent throughout their imaged courses. Veins: The IVC and pelvic venous systems appear widely patent. Review of the MIP images confirms the above findings. _________________________________________________________ NON-VASCULAR Lower chest: Limited visualization of the lower thorax demonstrates an approximately 0.9 x 0.5 x 0.7 cm nodular opacity within the imaged left lower lobe (image 23, series 13; coronal image 117, series 11), potentially an area atelectasis though a discrete pulmonary nodule is not excluded on the basis of this examination and was not seen on the 11/2019 examination, the finding is associated with scattered short-segment occlusion of several adjacent bronchi and thus may represent area of aspiration. Note is also made of unchanged punctate granuloma within the left lower lobe (images 20 and 43, series 13). Minimal dependent subpleural ground-glass atelectasis. No focal airspace opacities. No pleural effusion. Hepatobiliary: Normal hepatic contour. No discrete  hepatic lesions. Layering gallstones are seen within a decompressed but otherwise normal-appearing gallbladder. No intra extrahepatic bili duct dilatation. No ascites. Pancreas: Pancreas remains atrophic. Spleen: Punctate granuloma within otherwise normal-appearing spleen. Punctate splenule is noted at the level of the hilum. Adrenals/Urinary Tract: Is symmetric enhancement of the bilateral kidneys. Note is again made of a right-sided parapelvic cyst. Redemonstrated geographic atrophy involving the inferolateral aspect of the left kidney (image 104, series 9). No evidence of nephrolithiasis on this postcontrast examination. No urinary obstruction or perinephric stranding. Normal appearance of the bilateral adrenal glands. There is mild thickening the urinary bladder wall, potentially accentuated underdistention. Stomach/Bowel: There are no discrete areas of intraluminal contrast extravasation to suggest GI bleeding. There is moderate distension of multiple loops of small bowel with discrete transition site located within the right mid abdomen (image 40, series 6; coronal image 64, series 9), with decompression of the downstream small bowel, findings concerning for a high-grade small bowel obstruction. The exact etiology of the transition is not identified and thus presumably secondary to adhesions. Normal appearance of the terminal ileum. The appendix is not visualized however there is no pericecal inflammatory change. Moderate colonic stool burden. Scattered colonic diverticulosis without evidence of superimposed acute diverticulitis. No pneumoperitoneum, pneumatosis or portal venous gas. No significant hiatal hernia. Lymphatic: No bulky retroperitoneal, mesenteric, pelvic or inguinal lymphadenopathy. Reproductive: Prostate is borderline enlarged with minimal mass effect on the undersurface of the urinary bladder. No free fluid within the pelvic cul-de-sac. Other: Post bilateral inguinal hernia repair without  evidence of recurrence. Tiny mesenteric fat containing periumbilical hernia. There is a minimal amount of subcutaneous edema about the midline of the low back. Musculoskeletal: No acute or aggressive osseous abnormalities. Post L3-L4 and L4-L5 intervertebral disc space replacement as well as the right lateral fusion  of the L3-L4 intervertebral disc space and paraspinal fusion of L4-L5. No evidence of hardware failure or loosening. Mild kyphosis of the thoracolumbar junction without acute compression deformity. Mild degenerative change of the bilateral hips with joint space loss, subchondral sclerosis and osteophytosis. IMPRESSION: VASCULAR 1. No evidence of intraluminal contrast extravasation to suggest GI bleeding. 2. Minimal amount of atherosclerotic plaque within normal caliber abdominal aorta, not resulting in a hemodynamically significant narrowing. NON-VASCULAR 1. Findings worrisome for high-grade small bowel obstruction with transition site located within the right mid hemiabdomen, the etiology of which is not depicted on this examination and thus presumably secondary to adhesions. 2. Cholelithiasis without evidence of cholecystitis. 3. Colonic diverticulosis without evidence of superimposed acute diverticulitis. 4. Potential punctate (0.5 cm) left lower lobe nodule versus area of nodular atelectasis/aspiration, not seen on the 10/20 21 examination. Consider one of the following in 3 months for both low-risk and high-risk individuals: (a) repeat chest CT, (b) follow-up PET-CT, or (c) tissue sampling. This recommendation follows the consensus statement: Guidelines for Management of Incidental Pulmonary Nodules Detected on CT Images: From the Fleischner Society 2017; Radiology 2017; 284:228-243. Electronically Signed   By: Sandi Mariscal M.D.   On: 09/06/2020 08:35     Assessment/Plan Principal Problem:   SBO (small bowel obstruction) (HCC) Active Problems:   Hypothyroidism   GERD (gastroesophageal reflux  disease)   h/o Hyponatremia   DM assoc with CKD (chronic kidney disease), stage III (HCC)   SIADH (syndrome of inappropriate ADH production) ([place])      Small bowel obstruction Patient presents for evaluation of a 3-day history of nausea and vomiting as well as changes in his bowel habits but denies having any abdominal pain. Imaging shows a high-grade small bowel obstruction with transition point located within the right hemiabdomen We will keep patient n.p.o. for now Consult surgery Supportive care with gastric decompression, IV fluid hydration, IV PPI and antiemetics.    Hyponatremia Chronic and secondary to SIADH Judicious IV fluid resuscitation with normal saline    Diabetes mellitus with complications of stage III chronic kidney disease Patient has diet-controlled diabetes mellitus Renal function appears stable Blood sugar checks every 4 hours    GERD Continue IV PPI    Hypothyroidism Hold Synthroid for now  DVT prophylaxis: SCD Code Status: DO NOT RESUSCITATE Family Communication: Greater than 50% of time was spent discussing patient's condition and plan of care with him at the bedside.  All questions and concerns have been addressed.  He lists his wife as his healthcare power of attorney.  CODE STATUS was discussed and he is a DO NOT RESUSCITATE. Disposition Plan: Back to previous home environment Consults called: Surgery Status: At the time of admission, it appears that the appropriate admission status for this patient is inpatient. This is judged to be reasonable and necessary in order to provide the required intensity of service to ensure the patient's safety given the presenting symptoms, physical exam findings and initial radiographic and laboratory data in the context of their comorbid conditions. Patient requires inpatient status due to high intensity of service, high risk of further deterioration and high frequency of surveillance required.    Collier Bullock MD [facility]     09/06/2020, 12:14 PM

## 2020-09-06 NOTE — ED Triage Notes (Signed)
Pt to ED from home c/o n/v x3 days, denies diarrhea, pain or SOB.  States ate clam chowder a couple days ago neighbor made for him, believes that is the cause.  States feeling weak since throwing up.  Pt denies blood in vomit, denies blood thinners or cardiac hx.  Pt A&Ox4, chest rise even and unlabored and in NAD at this time.  Pt states tested positive COVID June 25.

## 2020-09-06 NOTE — Consult Note (Signed)
Patient ID: Kyle Simon, male   DOB: 1932/12/18, 87 y.o.   MRN: [id number]  HPI Kyle Simon is a 87 y.o. male seen in consultation at the request of Dr. Dartha Lodge for small bowel obstruction.  He stated that his symptoms started about 3 days ago with nausea vomiting abdominal pain.  Patient reports that the pain was colicky moderate intensity.  Pain now has subsided.  Currently denies passing any flatus and last bowel movement was yesterday.  No fevers no chills.  He is very functional for an 87 year old male.  He used to be in [employer] and is retired ideation engineered.  He comes with his wife.  He is able to perform more than 4 METS of activity without any shortness of breath or chest pain.  He does have a surgical history significant for bilateral inguinal hernia repairs performed more than 15 years ago by Dr. Simona Huh ,[facility]. apparently he did have issues with chronic pain on the right side.  He also had significant history of multiple back surgeries. He had also had bilateral knee replacement and has done well. CT scan personally reviewed showing evidence of small bowel obstruction with a clear transition zone in the right lower quadrant.  There is no evidence of obvious masses.  No pneumoperitoneum no free air Work shows a white count of 16.4 hemoglobin of 14.7 and platelets of 364.  CMP shows evidence of sodium of 125 chloride of 89 and BUN of 29 with a creatinine of 1.16.  Rest of his labs are within normal limits   HPI  Past Medical History:  Diagnosis Date   Arthritis    both hands;Dr.Deveshwar   Constipation due to pain medication    Diabetes mellitus without complication (HCC)    Type II. Uses no medication. Pt controls with diet and weight   Enlarged prostate    Frequent urination at night    GERD (gastroesophageal reflux disease)    History of noncompliance with medical treatment, presenting hazards to health 11/20/2018   Hyperlipidemia    Hyperlipidemia associated with type 2  diabetes mellitus (HCC) 04/19/2007   Qualifier: Diagnosis of  By: Alwyn Ren MD, William     Hypertension    Hypothyroidism    Polio    as a child w/o complications   Snoring    no sleep apnea   Statin declined-  11/20/2018   patient understands risks associated with this decision and has been advised to restart by cardiologist as well as myself several times   Torn meniscus    Transfusion history 2012   post TKR    Past Surgical History:  Procedure Laterality Date   ANTERIOR LAT LUMBAR FUSION Right 11/03/2017   Procedure: Right Lumbar three-four Anterolateral lumbar interbody fusion with lateral plate;  Surgeon: Maeola Harman, MD;  Location: [facility] OR;  Service: Neurosurgery;  Laterality: Right;   BACK SURGERY  2011   hemiarthroplasty 01/11/1999 and 6   CARDIAC CATHETERIZATION  09/2001   negative   COLONOSCOPY     EYE SURGERY  2003   R&L cataract removed & IOL- 2003 & 2007,Dr.Jenkins   INGUINAL HERNIA REPAIR  2002   right, Dr. Luan Pulling   JOINT REPLACEMENT  2012   left knee replacement   LIPOMA EXCISION     back   MENISCECTOMY Bilateral    Dr. Thurston Hole   PROSTATE SURGERY  07/2006   for urinary urgency, Dr. Tiana Loft CUFF REPAIR Right October, 2016   sinus &  throat surgery-1995  1995   TONSILLECTOMY     TOTAL KNEE ARTHROPLASTY  01/11/2011   Procedure: TOTAL KNEE ARTHROPLASTY;  Surgeon: Raymon Mutton, MD;  Location: [facility] OR;  Service: Orthopedics;  Laterality: Left;   TOTAL KNEE ARTHROPLASTY Right 11/22/2016   Procedure: TOTAL KNEE ARTHROPLASTY;  Surgeon: Dannielle Huh, MD;  Location: [facility] OR;  Service: Orthopedics;  Laterality: Right;   UVULECTOMY  1996   Dr. Lazarus Salines   VASECTOMY     WRIST SURGERY  1959   fracture- right    Family History  Problem Relation Age of Onset   Sudden death Father        accident   Coronary artery disease Mother    Emphysema Mother    Diverticulosis Mother    Thyroid disease Mother    Asthma Brother    Bone cancer Brother    Diabetes Paternal  Grandmother    Sarcoidosis Son    Thyroid disease Daughter    Anesthesia problems Neg Hx    Hypotension Neg Hx    Malignant hyperthermia Neg Hx    Pseudochol deficiency Neg Hx    Colon cancer Neg Hx     Social History Social History   Tobacco Use   Smoking status: Former    Packs/day: 1.00    Years: 18.00    Pack years: 18.00    Types: Cigarettes    Quit date: 03/01/1966    Years since quitting: 54.5   Smokeless tobacco: Never  Vaping Use   Vaping Use: Never used  Substance Use Topics   Alcohol use: Yes    Alcohol/week: 4.0 standard drinks    Types: 4 Shots of liquor per week    Comment: social, few drinks a week   Drug use: No    Allergies  Allergen Reactions   Simvastatin Other (See Comments)    MYALGIAS   Other     Other reaction(s): Unknown Other reaction(s): Unknown   Oxycodone Other (See Comments)    Severe constipation    Current Facility-Administered Medications  Medication Dose Route Frequency Provider Last Rate Last Admin   0.9 %  sodium chloride infusion   Intravenous Continuous Agbata, Tochukwu, MD       ondansetron (ZOFRAN) tablet 4 mg  4 mg Oral Q6H PRN Agbata, Tochukwu, MD       Or   ondansetron (ZOFRAN) injection 4 mg  4 mg Intravenous Q6H PRN Agbata, Tochukwu, MD       pantoprazole (PROTONIX) injection 40 mg  40 mg Intravenous Q24H Agbata, Tochukwu, MD       sodium chloride 0.9 % bolus 1,000 mL  1,000 mL Intravenous Once Irmgard Rampersaud F, MD       Current Outpatient Medications  Medication Sig Dispense Refill   acetaminophen (TYLENOL) 500 MG tablet Take 1,000 mg by mouth every 6 (six) hours as needed for mild pain. Takes 2 tablets nightly     aspirin 81 MG tablet Take 81 mg by mouth every evening.      Calcium Carb-Cholecalciferol (CALCIUM 600 + D PO) Take 1 tablet by mouth daily.     celecoxib (CELEBREX) 200 MG capsule Take 200 mg by mouth daily.     diclofenac Sodium (VOLTAREN) 1 % GEL Apply 2 g topically at bedtime.     Hypromellose  (ARTIFICIAL TEARS OP) Place 1 drop into both eyes 2 (two) times daily.     levothyroxine (SYNTHROID) 150 MCG tablet Take 1 tablet (150 mcg total) by mouth daily  before breakfast. Take 1.5 tablet (225 mcg) once a week (Patient taking differently: Take 150 mcg by mouth daily before breakfast.) 96 tablet 1   losartan (COZAAR) 50 MG tablet Take 1 tablet (50 mg total) by mouth daily. 90 tablet 3   Methylcellulose, Laxative, (CITRUCEL PO) Take 1 g by mouth in the morning.     Multiple Vitamin (MULTIVITAMIN WITH MINERALS) TABS Take 1 tablet by mouth daily.     Omega-3 Fatty Acids (FISH OIL) 1000 MG CAPS Take 1,000 mg by mouth every evening.     Probiotic Product (RESTORA) CAPS One tab qd (Patient taking differently: Take 1 capsule by mouth every evening.) 90 capsule 3   rosuvastatin (CRESTOR) 5 MG tablet Take 1 tablet (5 mg total) by mouth at bedtime. 90 tablet 3   tamsulosin (FLOMAX) 0.4 MG CAPS capsule Take 0.4 mg by mouth 2 (two) times daily.     vitamin B-12 (CYANOCOBALAMIN) 1000 MCG tablet Take 1,000 mcg by mouth daily.     blood glucose meter kit and supplies KIT Inject 1 each into the skin as directed. Dispense based on patient and insurance preference. Use up to four times daily as directed. (FOR ICD-9 250.00, 250.01). 1 each 0   glucose blood (PRECISION XTRA TEST STRIPS) test strip USE TO CHECK BLOOD SUGAR ONCE DAILY DX E11.9 100 each 2   LANCETS ULTRA THIN 30G MISC Patient is to use to check blood glucose in the AM fasting and 2 hours after largest meal. 100 each 6   NONFORMULARY OR COMPOUNDED ITEM Glucometer (Precision XTRA- Model #[id number]) 1 each 0     Review of Systems Full ROS  was asked and was negative except for the information on the HPI  Physical Exam Blood pressure 138/83, pulse 73, temperature 97.9 F (36.6 C), resp. rate 19, height 5\' 10"  (1.778 m), weight 86.2 kg, SpO2 100 %. CONSTITUTIONAL: NAD, alert EYES: Pupils are equal, round, , Sclera are non-icteric. EARS, NOSE,  MOUTH AND THROAT: He is wearing a mask, hearing is intact to voice. LYMPH NODES:  Lymph nodes in the neck are normal. RESPIRATORY:  Lungs are clear. There is normal respiratory effort, with equal breath sounds bilaterally, and without pathologic use of accessory muscles. CARDIOVASCULAR: Heart is regular without murmurs, gallops, or rubs. GI: The abdomen is  soft, nontender, no rebound no peritonitis , distended. There are no palpable masses. There is no hepatosplenomegaly. There are decreased  bowel sounds. GU: Rectal deferred.   MUSCULOSKELETAL: Normal muscle strength and tone. No cyanosis or edema.   SKIN: Turgor is good and there are no pathologic skin lesions or ulcers. NEUROLOGIC: Motor and sensation is grossly normal. Cranial nerves are grossly intact. PSYCH:  Oriented to person, place and time. Affect is normal.  Data Reviewed  I have personally reviewed the patient's imaging, laboratory findings and medical records.    Assessment/Plan Kyle Simon is a very functional 87 year old male presents with classic signs and symptoms of small bowel obstruction confirmed by CT scan.  He only has had bilateral inguinal hernia repairs but no true major abdominal operations.  CT findings show a discrete transition area in the right upper quadrant.  Given his age and clinical presentation I am more concerned that he may require surgical intervention.  We will start with a Gastrografin challenge.  If he does not improve in the next 24 to 48 hours I do think that he will merit at least a diagnostic laparoscopy.  Currently he is not toxic nor  per genetic and does not need an emergent surgical intervention at this time.  We will continue to follow him closely   Sterling Big, MD FACS General Surgeon 09/06/2020, 1:17 PM

## 2020-09-06 NOTE — ED Provider Notes (Signed)
[facility] Emergency Department Provider Note  ____________________________________________   Event Date/Time   First MD Initiated Contact with Patient 09/06/20 0601     (approximate)  I have reviewed the triage vital signs and the nursing notes.   HISTORY  Chief Complaint Nausea and Vomiting    HPI Kyle Simon is a 87 y.o. male with history of diabetes, hypertension, hyperlipidemia, hypothyroidism, thoracic aortic aneurysm, chronic kidney disease who presents to the emergency department with his wife for concerns for nausea and vomiting that started Thursday, July 7.  He states that it started after eating a cup of clam chowder from his neighbor.  States that it was red in color and when he started having red emesis it did not concern him.  His wife reports that he continues to vomit "gallons" of red liquid and she was concerned that he was bleeding.  He denies this and states he does not think it is blood.  He denies bloody stools, melena.  No diarrhea.  His last bowel movement was 2 days ago.  Did have some intermittent stomach cramping but this has resolved.  No fever.  Not on blood thinners.  No chest pain or shortness of breath.  Does report that he was exposed to COVID-19 after being on a cruise.  States they got off the cruise on June 25.  That time he had some symptoms of a "head cold" and a temperature of 99.9 which has resolved.  Wife reports they stayed on the cruise ship the entire time and only got off the ship for a very brief period at [facility].      Past Medical History:  Diagnosis Date   Arthritis    both hands;Dr.Deveshwar   Constipation due to pain medication    Diabetes mellitus without complication ([place])    Type II. Uses no medication. Pt controls with diet and weight   Enlarged prostate    Frequent urination at night    GERD (gastroesophageal reflux disease)    History of noncompliance with medical treatment, presenting hazards to  health 11/20/2018   Hyperlipidemia    Hyperlipidemia associated with type 2 diabetes mellitus ([place]) 04/19/2007   Qualifier: Diagnosis of  By: Linna Darner MD, Gwyndolyn Saxon     Hypertension    Hypothyroidism    Polio    as a child w/o complications   Snoring    no sleep apnea   Statin declined-  11/20/2018   patient understands risks associated with this decision and has been advised to restart by cardiologist as well as myself several times   Torn meniscus    Transfusion history 2012   post TKR    Patient Active Problem List   Diagnosis Date Noted   Hyperkalemia 02/05/2020   Shortness of breath 02/05/2020   Thoracic aortic aneurysm ([place]) 12/10/2019   Educated about COVID-19 virus infection 11/29/2019   SIADH (syndrome of inappropriate ADH production) ([place]) 03/21/2019   Flatulence 11/29/2018   Elevated coronary artery calcium score 11/25/2018   History of noncompliance with medical treatment, presenting hazards to health 11/20/2018   Aortic atherosclerosis ([place]) 10/05/2017   Fatigue 08/22/2017   Chronic pain of left knee 08/22/2017   Stressful life event affecting family-loss on May 31st 08/22/2017   Inactivity-recently due to knee pain 08/22/2017   Lumbar radiculopathy 06/16/2017   Total knee replacement status, right 04/14/2017   DM assoc with CKD (chronic kidney disease), stage III ([place]) 04/14/2017   Arthralgia of right temporomandibular  joint 03/02/2017   Chronic eczematous otitis externa of both ears 03/02/2017   Presbycusis of both ears 03/02/2017   H/O total knee replacement, bilateral 11/22/2016   ETD (Eustachian tube dysfunction), right 09/15/2016   Strain of masseter muscle 09/15/2016   Trochanteric bursitis of right hip 09/07/2016   Age-related osteoporosis without current pathological fracture 07/16/2016   History of lumbar fusion 07/16/2016   History of total knee arthroplasty, left 07/16/2016   Cough 01/04/2016   High risk medications (not anticoagulants) long-term use  01/04/2016   Hypomagnesemia 12/29/2015   h/o Hyponatremia 12/29/2015   Cerumen impaction 08/25/2015   Constipation 08/06/2015   Type 2 diabetes mellitus with other specified complication ([place]) 33/43/5686   Flatulence symptom 07/25/2015   Diarrhea 06/21/2015   Chronic rhinitis 11/01/2014   Hypertension associated with diabetes ([place]) 03/20/2014   Spondylolisthesis of lumbar region 10/23/2013   Diverticulosis of colon without hemorrhage 01/22/2013   B12 deficiency anemia 01/22/2013   Anemia, unspecified 08/13/2012   Synovial cyst of lumbar spine 07/26/2011   Spasms of the hands or feet 07/26/2011   GERD (gastroesophageal reflux disease) 03/26/2011   SENILE KERATOSIS 06/12/2008   Hyperlipidemia associated with type 2 diabetes mellitus ([place]) 04/19/2007   SNORING, HX OF 04/19/2007   BPH (benign prostatic hyperplasia) 04/18/2007   Hypothyroidism 04/20/2006    Past Surgical History:  Procedure Laterality Date   ANTERIOR LAT LUMBAR FUSION Right 11/03/2017   Procedure: Right Lumbar three-four Anterolateral lumbar interbody fusion with lateral plate;  Surgeon: Erline Levine, MD;  Location: [place];  Service: Neurosurgery;  Laterality: Right;   BACK SURGERY  2011   hemiarthroplasty 01/11/1999 and 6   CARDIAC CATHETERIZATION  09/2001   negative   COLONOSCOPY     EYE SURGERY  2003   R&L cataract removed & IOL- 2003 & 2007,Dr.Jenkins   INGUINAL HERNIA REPAIR  2002   right, Dr. Truitt Leep   JOINT REPLACEMENT  2012   left knee replacement   LIPOMA EXCISION     back   MENISCECTOMY Bilateral    Dr. Noemi Chapel   PROSTATE SURGERY  07/2006   for urinary urgency, Dr. Everardo Pacific CUFF REPAIR Right October, 2016   sinus & throat surgery-1995  1995   TONSILLECTOMY     TOTAL KNEE ARTHROPLASTY  01/11/2011   Procedure: TOTAL KNEE ARTHROPLASTY;  Surgeon: Rudean Haskell, MD;  Location: [place];  Service: Orthopedics;  Laterality: Left;   TOTAL KNEE ARTHROPLASTY Right 11/22/2016   Procedure: TOTAL KNEE  ARTHROPLASTY;  Surgeon: Vickey Huger, MD;  Location: [place];  Service: Orthopedics;  Laterality: Right;   UVULECTOMY  1683   Dr. Erik Obey   [place]   fracture- right    Prior to Admission medications   Medication Sig Start Date End Date Taking? Authorizing Provider  acetaminophen (TYLENOL) 500 MG tablet Take 1,000 mg by mouth every 6 (six) hours as needed for mild pain. Takes 2 tablets nightly    [provider]  aspirin 81 MG tablet Take 81 mg by mouth every evening.     [provider]  blood glucose meter kit and supplies KIT Inject 1 each into the skin as directed. Dispense based on patient and insurance preference. Use up to four times daily as directed. (FOR ICD-9 250.00, 250.01). 02/05/20   Lesleigh Noe, MD  Calcium Carb-Cholecalciferol (CALCIUM 600 + D PO) Take 1 tablet by mouth daily.    [provider]  celecoxib (CELEBREX) 200 MG capsule Take by mouth 2 (two) times daily. 09/18/19   [provider]  diclofenac Sodium (VOLTAREN) 1 % GEL Apply topically. 09/10/19   [provider]  glucose blood (PRECISION XTRA TEST STRIPS) test strip USE TO CHECK BLOOD SUGAR ONCE DAILY DX E11.9 04/17/15   Midge Minium, MD  Hypromellose (ARTIFICIAL TEARS OP) Place 1 drop into both eyes 2 (two) times daily.    [provider]  LANCETS ULTRA THIN 30G [place] Patient is to use to check blood glucose in the AM fasting and 2 hours after largest meal. 04/14/17   Opalski, Neoma Laming, DO  levothyroxine (SYNTHROID) 150 MCG tablet Take 1 tablet (150 mcg total) by mouth daily before breakfast. Take 1.5 tablet (225 mcg) once a week 08/15/20   Elayne Snare, MD  losartan (COZAAR) 50 MG tablet Take 1 tablet (50 mg total) by mouth daily. 09/20/19   Lesleigh Noe, MD  Methylcellulose, Laxative, (CITRUCEL PO) Take by mouth at bedtime.    [provider]  Multiple Vitamin (MULTIVITAMIN WITH MINERALS) TABS Take 1 tablet by mouth daily.     [provider]  NONFORMULARY OR COMPOUNDED ITEM Glucometer (Precision XTRA- Model #[id number]) 04/07/18   Mellody Dance, DO  Omega-3 Fatty Acids (FISH OIL) 1000 MG CAPS Take 1,000 mg by mouth every evening.    [provider]  Probiotic Product (RESTORA) CAPS One tab qd Patient taking differently: Take 1 capsule by mouth every evening. 07/25/15   Opalski, Neoma Laming, DO  rosuvastatin (CRESTOR) 5 MG tablet Take 1 tablet (5 mg total) by mouth at bedtime. 09/20/19   Lesleigh Noe, MD  tamsulosin (FLOMAX) 0.4 MG CAPS capsule Take 0.4 mg by mouth 2 (two) times daily.    [provider]  vitamin B-12 (CYANOCOBALAMIN) 1000 MCG tablet Take 1,000 mcg by mouth daily.    [provider]    Allergies Simvastatin, Other, and Oxycodone  Family History  Problem Relation Age of Onset   Sudden death Father        accident   Coronary artery disease Mother    Emphysema Mother    Diverticulosis Mother    Thyroid disease Mother    Asthma Brother    Bone cancer Brother    Diabetes Paternal Grandmother    Sarcoidosis Son    Thyroid disease Daughter    Anesthesia problems Neg Hx    Hypotension Neg Hx    Malignant hyperthermia Neg Hx    Pseudochol deficiency Neg Hx    Colon cancer Neg Hx     Social History Social History   Tobacco Use   Smoking status: Former    Packs/day: 1.00    Years: 18.00    Pack years: 18.00    Types: Cigarettes    Quit date: 03/01/1966    Years since quitting: 54.5   Smokeless tobacco: Never  Vaping Use   Vaping Use: Never used  Substance Use Topics   Alcohol use: Yes    Alcohol/week: 4.0 standard drinks    Types: 4 Shots of liquor per week    Comment: social, few drinks a week   Drug use: No    Review of Systems Constitutional: No fever. Eyes: No visual changes. ENT: No sore throat. Cardiovascular: Denies chest pain. Respiratory: Denies shortness of breath. Gastrointestinal: + Nausea and vomiting.  No  diarrhea. Genitourinary: Negative for dysuria. Musculoskeletal: Negative for back pain. Skin: Negative for rash. Neurological: Negative for focal weakness or numbness.  ____________________________________________   PHYSICAL EXAM:  VITAL SIGNS: ED Triage Vitals [09/06/20 0427]  Enc Vitals Group     BP 107/81     Pulse Rate 96     Resp 18     Temp 97.8 F (36.6 C)     Temp Source Oral     SpO2 98 %     Weight 190 lb (86.2 kg)     Height _0  (1.778 m)     Head Circumference      Peak Flow      Pain Score 0     Pain Loc      Pain Edu?      Excl. in [place]?    CONSTITUTIONAL: Alert and oriented and responds appropriately to questions. Well-appearing; well-nourished, elderly, in no distress HEAD: Normocephalic EYES: Conjunctivae clear, pupils appear equal, EOM appear intact ENT: normal nose; moist mucous membranes NECK: Supple, normal ROM CARD: RRR; S1 and S2 appreciated; no murmurs, no clicks, no rubs, no gallops RESP: Normal chest excursion without splinting or tachypnea; breath sounds clear and equal bilaterally; no wheezes, no rhonchi, no rales, no hypoxia or respiratory distress, speaking full sentences ABD/GI: Normal bowel sounds; non-distended; soft, non-tender, no rebound, no guarding, no peritoneal signs, no hepatosplenomegaly RECTAL:  Normal rectal tone, no gross blood or melena, guaiac negative, brown appearing stool, no hemorrhoids appreciated, nontender rectal exam, no fecal impaction. Chaperone present. BACK: The back appears normal EXT: Normal ROM in all joints; no deformity noted, no edema; no cyanosis SKIN: Normal color for age and race; warm; no rash on exposed skin NEURO: Moves all extremities equally PSYCH: The patient's mood and manner are appropriate.  ____________________________________________   LABS (all labs ordered are listed, but only abnormal results are displayed)  Labs Reviewed  COMPREHENSIVE METABOLIC PANEL - Abnormal; Notable for the  following components:      Result Value   Sodium 125 (*)    Chloride 89 (*)    Glucose, Bld 187 (*)    BUN 29 (*)    Total Bilirubin 1.7 (*)    All other components within normal limits  CBC - Abnormal; Notable for the following components:   WBC 16.4 (*)    All other components within normal limits  LIPASE, BLOOD  URINALYSIS, COMPLETE (UACMP) WITH MICROSCOPIC  TROPONIN I (HIGH SENSITIVITY)   ____________________________________________  EKG   EKG Interpretation  Date/Time:  Saturday September 06 2020 04:42:21 EDT Ventricular Rate:  90 PR Interval:  160 QRS Duration: 76 QT Interval:  344 QTC Calculation: 420 R Axis:   51 Text Interpretation: Sinus rhythm with Premature atrial complexes Nonspecific ST abnormality Abnormal ECG Confirmed by Pryor Curia [phone]) on 09/06/2020 7:07:01 AM         ____________________________________________  RADIOLOGY Jessie Foot Jemery Stacey, personally viewed and evaluated these images (plain radiographs) as part of my medical decision making, as well as reviewing the written report by the radiologist.  ED MD interpretation:  CTAP pending  Official radiology report(s): No results found.  ____________________________________________   PROCEDURES  Procedure(s) performed (including Critical Care):  Procedures  ____________________________________________   INITIAL IMPRESSION / ASSESSMENT AND PLAN / ED COURSE  As part of my medical decision making, I reviewed the following data within the [place] History obtained from family, Nursing notes reviewed and incorporated, Labs reviewed , EKG interpreted , Old EKG reviewed, Patient signed out to Dr. Archie Balboa, and Notes from prior ED visits  Patient here with nausea and vomiting for the past couple of days.  No fevers, chest pain, shortness of breath, diarrhea, abdominal pain.  Labs show mild hyponatremia which appears to be a chronic issue for the patient.  His hemoglobin  is normal at 14.7.  BUN only minimally elevated which also appears to be his baseline.  Labs today not suggestive of large-volume hematemesis.  He has brown appearing stool on rectal exam and is guaiac negative today.  He does have a leukocytosis however of 16,000.  Given age and comorbidities, will obtain CT of the abdomen pelvis.  Abdominal exam is benign today.  Doubt perforation, peritonitis.  Differential includes gastritis, gastroenteritis, colitis, UTI.  Doubt appendicitis, diverticulitis, cholecystitis, pancreatitis, cholangitis.  Urine, CTA of the abdomen pelvis pending.  Will obtain CTA to rule out any signs of active bleeding given wife's concerns.  Patient continues to be hemodynamically stable here.  Reports feeling better after Zofran in triage.  Patient is not having any chest pain or shortness of breath.  EKG nonischemic.  Troponin negative.  I do not think this is his anginal equivalent.  ED PROGRESS  CT, urine pending.  Signed out to Dr. Archie Balboa.  I reviewed all nursing notes and pertinent previous records as available.  I have reviewed and interpreted any EKGs, lab and urine results, imaging (as available).  ____________________________________________   FINAL CLINICAL IMPRESSION(S) / ED DIAGNOSES  Final diagnoses:  Nausea and vomiting in adult     ED Discharge Orders     None       *Please note:  Kyle Simon was evaluated in Emergency Department on 09/06/2020 for the symptoms described in the history of present illness. He was evaluated in the context of the global COVID-19 pandemic, which necessitated consideration that the patient might be at risk for infection with the SARS-CoV-2 virus that causes COVID-19. Institutional protocols and algorithms that pertain to the evaluation of patients at risk for COVID-19 are in a state of rapid change based on information released by regulatory bodies including the CDC and federal and state organizations. These policies and  algorithms were followed during the patient's care in the ED.  Some ED evaluations and interventions may be delayed as a result of limited staffing during and the pandemic.*   Note:  This document was prepared using Dragon voice recognition software and may include unintentional dictation errors.    Shenea Giacobbe, Delice Bison, DO 09/06/20 [phone]

## 2020-09-07 ENCOUNTER — Inpatient Hospital Stay: Payer: Medicare Other

## 2020-09-07 ENCOUNTER — Inpatient Hospital Stay: Payer: Medicare Other | Admitting: Anesthesiology

## 2020-09-07 ENCOUNTER — Encounter: Payer: Self-pay | Admitting: Internal Medicine

## 2020-09-07 ENCOUNTER — Encounter
Admission: EM | Disposition: A | Discharge status: Home / Self Care | Payer: Self-pay | Source: Home / Self Care | Attending: Internal Medicine

## 2020-09-07 DIAGNOSIS — K565 Intestinal adhesions [bands], unspecified as to partial versus complete obstruction: Secondary | ICD-10-CM

## 2020-09-07 DIAGNOSIS — E785 Hyperlipidemia, unspecified: Secondary | ICD-10-CM | POA: Diagnosis not present

## 2020-09-07 DIAGNOSIS — K56609 Unspecified intestinal obstruction, unspecified as to partial versus complete obstruction: Secondary | ICD-10-CM | POA: Diagnosis not present

## 2020-09-07 DIAGNOSIS — K469 Unspecified abdominal hernia without obstruction or gangrene: Secondary | ICD-10-CM

## 2020-09-07 HISTORY — PX: XI ROBOTIC ASSISTED VENTRAL HERNIA: SHX6789

## 2020-09-07 LAB — CBC
HCT: 37.4 % — ABNORMAL LOW (ref 39.0–52.0)
Hemoglobin: 13 g/dL (ref 13.0–17.0)
MCH: 31.9 pg (ref 26.0–34.0)
MCHC: 34.8 g/dL (ref 30.0–36.0)
MCV: 91.7 fL (ref 80.0–100.0)
Platelets: 285 10*3/uL (ref 150–400)
RBC: 4.08 MIL/uL — ABNORMAL LOW (ref 4.22–5.81)
RDW: 12.9 % (ref 11.5–15.5)
WBC: 10.8 10*3/uL — ABNORMAL HIGH (ref 4.0–10.5)
nRBC: 0 % (ref 0.0–0.2)

## 2020-09-07 LAB — BASIC METABOLIC PANEL
Anion gap: 8 (ref 5–15)
BUN: 34 mg/dL — ABNORMAL HIGH (ref 8–23)
CO2: 24 mmol/L (ref 22–32)
Calcium: 8.7 mg/dL — ABNORMAL LOW (ref 8.9–10.3)
Chloride: 98 mmol/L (ref 98–111)
Creatinine, Ser: 1 mg/dL (ref 0.61–1.24)
GFR, Estimated: >60 mL/min (ref >60)
Glucose, Bld: 102 mg/dL — ABNORMAL HIGH (ref 70–99)
Potassium: 4.5 mmol/L (ref 3.5–5.1)
Sodium: 130 mmol/L — ABNORMAL LOW (ref 135–145)

## 2020-09-07 LAB — GLUCOSE, CAPILLARY
Glucose-Capillary: 110 mg/dL — ABNORMAL HIGH (ref 70–99)
Glucose-Capillary: 125 mg/dL — ABNORMAL HIGH (ref 70–99)
Glucose-Capillary: 156 mg/dL — ABNORMAL HIGH (ref 70–99)

## 2020-09-07 LAB — MAGNESIUM: Magnesium: 2.2 mg/dL (ref 1.7–2.4)

## 2020-09-07 SURGERY — REPAIR, HERNIA, VENTRAL, ROBOT-ASSISTED
Anesthesia: General | Site: Abdomen

## 2020-09-07 MED ORDER — KETOROLAC TROMETHAMINE 15 MG/ML IJ SOLN
INTRAMUSCULAR | Status: AC
  Administered 2020-09-07: 15 mg
  Filled 2020-09-07: qty 1

## 2020-09-07 MED ORDER — ONDANSETRON HCL 4 MG/2ML IJ SOLN
INTRAMUSCULAR | Status: AC
  Filled 2020-09-07: qty 2

## 2020-09-07 MED ORDER — FENTANYL CITRATE (PF) 100 MCG/2ML IJ SOLN
INTRAMUSCULAR | Status: AC
  Administered 2020-09-07: 50 ug via INTRAVENOUS
  Filled 2020-09-07: qty 2

## 2020-09-07 MED ORDER — LIDOCAINE HCL (PF) 2 % IJ SOLN
INTRAMUSCULAR | Status: AC
  Filled 2020-09-07: qty 5

## 2020-09-07 MED ORDER — ONDANSETRON HCL 4 MG/2ML IJ SOLN
INTRAMUSCULAR | Status: DC | PRN
  Administered 2020-09-07: 4 mg via INTRAVENOUS

## 2020-09-07 MED ORDER — SUGAMMADEX SODIUM 200 MG/2ML IV SOLN
INTRAVENOUS | Status: DC | PRN
  Administered 2020-09-07: 200 mg via INTRAVENOUS

## 2020-09-07 MED ORDER — FENTANYL CITRATE (PF) 100 MCG/2ML IJ SOLN
25.0000 ug | INTRAMUSCULAR | Status: DC | PRN
  Administered 2020-09-07: 50 ug via INTRAVENOUS

## 2020-09-07 MED ORDER — KETOROLAC TROMETHAMINE 15 MG/ML IJ SOLN: 15.0000 mg | Freq: Once | INTRAMUSCULAR | Status: AC

## 2020-09-07 MED ORDER — PHENYLEPHRINE HCL (PRESSORS) 10 MG/ML IV SOLN
INTRAVENOUS | Status: DC | PRN
  Administered 2020-09-07: 100 ug via INTRAVENOUS

## 2020-09-07 MED ORDER — PROPOFOL 10 MG/ML IV BOLUS
INTRAVENOUS | Status: AC
  Filled 2020-09-07: qty 20

## 2020-09-07 MED ORDER — INDOCYANINE GREEN 25 MG IV SOLR
INTRAVENOUS | Status: DC | PRN
  Administered 2020-09-07: 5 mg via INTRAVENOUS

## 2020-09-07 MED ORDER — DEXAMETHASONE SODIUM PHOSPHATE 10 MG/ML IJ SOLN
INTRAMUSCULAR | Status: AC
  Filled 2020-09-07: qty 1

## 2020-09-07 MED ORDER — LIDOCAINE HCL (CARDIAC) PF 100 MG/5ML IV SOSY
PREFILLED_SYRINGE | INTRAVENOUS | Status: DC | PRN
  Administered 2020-09-07: 100 mg via INTRAVENOUS

## 2020-09-07 MED ORDER — OXYCODONE-ACETAMINOPHEN 5-325 MG PO TABS
1.0000 | ORAL_TABLET | Freq: Four times a day (QID) | ORAL | Status: DC | PRN
  Filled 2020-09-07: qty 1

## 2020-09-07 MED ORDER — DEXAMETHASONE SODIUM PHOSPHATE 10 MG/ML IJ SOLN
INTRAMUSCULAR | Status: DC | PRN
  Administered 2020-09-07: 10 mg via INTRAVENOUS

## 2020-09-07 MED ORDER — LACTATED RINGERS IV SOLN: INTRAVENOUS | Status: DC | PRN

## 2020-09-07 MED ORDER — KETOROLAC TROMETHAMINE 15 MG/ML IJ SOLN
15.0000 mg | Freq: Four times a day (QID) | INTRAMUSCULAR | Status: DC
  Administered 2020-09-07 – 2020-09-09 (×6): 15 mg via INTRAVENOUS
  Filled 2020-09-07 (×8): qty 1

## 2020-09-07 MED ORDER — SUCCINYLCHOLINE CHLORIDE 20 MG/ML IJ SOLN
INTRAMUSCULAR | Status: DC | PRN
  Administered 2020-09-07: 160 mg via INTRAVENOUS

## 2020-09-07 MED ORDER — ROCURONIUM BROMIDE 100 MG/10ML IV SOLN
INTRAVENOUS | Status: DC | PRN
  Administered 2020-09-07: 40 mg via INTRAVENOUS

## 2020-09-07 MED ORDER — BUPIVACAINE LIPOSOME 1.3 % IJ SUSP
INTRAMUSCULAR | Status: DC | PRN
  Administered 2020-09-07: 20 mL

## 2020-09-07 MED ORDER — BUPIVACAINE-EPINEPHRINE 0.25% -1:200000 IJ SOLN
INTRAMUSCULAR | Status: DC | PRN
  Administered 2020-09-07: 30 mL

## 2020-09-07 MED ORDER — 0.9 % SODIUM CHLORIDE (POUR BTL) OPTIME
TOPICAL | Status: DC | PRN
  Administered 2020-09-07: 1000 mL

## 2020-09-07 MED ORDER — PROPOFOL 10 MG/ML IV BOLUS
INTRAVENOUS | Status: DC | PRN
  Administered 2020-09-07: 100 mg via INTRAVENOUS

## 2020-09-07 MED ORDER — MORPHINE SULFATE (PF) 2 MG/ML IV SOLN: 2.0000 mg | INTRAVENOUS | Status: DC | PRN

## 2020-09-07 MED ORDER — CEFAZOLIN SODIUM 1 G IJ SOLR
INTRAMUSCULAR | Status: AC
  Filled 2020-09-07: qty 20

## 2020-09-07 MED ORDER — FENTANYL CITRATE (PF) 100 MCG/2ML IJ SOLN
INTRAMUSCULAR | Status: DC | PRN
  Administered 2020-09-07: 75 ug via INTRAVENOUS

## 2020-09-07 MED ORDER — TRAMADOL HCL 50 MG PO TABS: 50.0000 mg | ORAL_TABLET | Freq: Four times a day (QID) | ORAL | Status: DC | PRN

## 2020-09-07 MED ORDER — FENTANYL CITRATE (PF) 250 MCG/5ML IJ SOLN
INTRAMUSCULAR | Status: AC
  Filled 2020-09-07: qty 5

## 2020-09-07 SURGICAL SUPPLY — 52 items
CANISTER SUCT 1200ML W/VALVE (MISCELLANEOUS) ×2 IMPLANT
CANNULA REDUC XI 12-8 STAPL (CANNULA) ×2
CANNULA REDUCER 12-8 DVNC XI (CANNULA) ×1 IMPLANT
CHLORAPREP W/TINT 26 (MISCELLANEOUS) ×2 IMPLANT
COVER TIP SHEARS 8 DVNC (MISCELLANEOUS) ×1 IMPLANT
COVER TIP SHEARS 8MM DA VINCI (MISCELLANEOUS) ×2
DEFOGGER SCOPE WARMER CLEARIFY (MISCELLANEOUS) ×2 IMPLANT
DERMABOND ADVANCED (GAUZE/BANDAGES/DRESSINGS) ×1
DERMABOND ADVANCED .7 DNX12 (GAUZE/BANDAGES/DRESSINGS) ×1 IMPLANT
DRAPE 3/4 80X56 (DRAPES) ×2 IMPLANT
DRAPE ARM DVNC X/XI (DISPOSABLE) ×4 IMPLANT
DRAPE COLUMN DVNC XI (DISPOSABLE) ×1 IMPLANT
DRAPE DA VINCI XI ARM (DISPOSABLE) ×8
DRAPE DA VINCI XI COLUMN (DISPOSABLE) ×2
ELECT CAUTERY BLADE 6.4 (BLADE) ×2 IMPLANT
ELECT REM PT RETURN 9FT ADLT (ELECTROSURGICAL) ×2
ELECTRODE REM PT RTRN 9FT ADLT (ELECTROSURGICAL) ×1 IMPLANT
GAUZE 4X4 16PLY ~~LOC~~+RFID DBL (SPONGE) ×2 IMPLANT
GLOVE SURG ENC MOIS LTX SZ7 (GLOVE) ×4 IMPLANT
GOWN STRL REUS W/ TWL LRG LVL3 (GOWN DISPOSABLE) ×3 IMPLANT
GOWN STRL REUS W/TWL LRG LVL3 (GOWN DISPOSABLE) ×6
GRASPER SUT TROCAR 14GX15 (MISCELLANEOUS) ×2 IMPLANT
IRRIGATION STRYKERFLOW (MISCELLANEOUS) IMPLANT
IRRIGATOR STRYKERFLOW (MISCELLANEOUS)
IV NS 1000ML (IV SOLUTION)
IV NS 1000ML BAXH (IV SOLUTION) IMPLANT
KIT PINK PAD W/HEAD ARE REST (MISCELLANEOUS) ×2 IMPLANT
KIT PINK PAD W/HEAD ARM REST (MISCELLANEOUS) ×1 IMPLANT
LABEL OR SOLS (LABEL) ×2 IMPLANT
MANIFOLD NEPTUNE II (INSTRUMENTS) ×2 IMPLANT
NEEDLE HYPO 22GX1.5 SAFETY (NEEDLE) ×2 IMPLANT
OBTURATOR OPTICAL STANDARD 8MM (TROCAR) ×2
OBTURATOR OPTICAL STND 8 DVNC (TROCAR) ×1
OBTURATOR OPTICALSTD 8 DVNC (TROCAR) ×1 IMPLANT
PACK LAP CHOLECYSTECTOMY (MISCELLANEOUS) ×2 IMPLANT
PENCIL ELECTRO HAND CTR (MISCELLANEOUS) ×2 IMPLANT
SEAL CANN UNIV 5-8 DVNC XI (MISCELLANEOUS) ×3 IMPLANT
SEAL XI 5MM-8MM UNIVERSAL (MISCELLANEOUS) ×6
SET TUBE SMOKE EVAC HIGH FLOW (TUBING) ×2 IMPLANT
SOLUTION ELECTROLUBE (MISCELLANEOUS) ×2 IMPLANT
SPONGE T-LAP 18X18 ~~LOC~~+RFID (SPONGE) ×2 IMPLANT
STAPLER CANNULA SEAL DVNC XI (STAPLE) ×1 IMPLANT
STAPLER CANNULA SEAL XI (STAPLE) ×2
SUT MNCRL 4-0 (SUTURE) ×2
SUT MNCRL 4-0 27XMFL (SUTURE) ×1
SUT STRATAFIX PDS 30 CT-1 (SUTURE) ×2 IMPLANT
SUT VICRYL 0 AB UR-6 (SUTURE) ×4 IMPLANT
SUT VLOC 90 2/L VL 12 GS22 (SUTURE) ×4 IMPLANT
SUTURE MNCRL 4-0 27XMF (SUTURE) ×1 IMPLANT
SYR 20ML LL LF (SYRINGE) ×2 IMPLANT
TAPE TRANSPORE STRL 2 31045 (GAUZE/BANDAGES/DRESSINGS) ×2 IMPLANT
TROCAR BALLN GELPORT 12X130M (ENDOMECHANICALS) ×2 IMPLANT

## 2020-09-07 NOTE — Anesthesia Procedure Notes (Signed)
Procedure Name: Intubation Date/Time: 09/07/2020 10:23 AM Performed by: Esaw Grandchild, CRNA Pre-anesthesia Checklist: Patient identified, Emergency Drugs available, Suction available and Patient being monitored Patient Re-evaluated:Patient Re-evaluated prior to induction Oxygen Delivery Method: Circle system utilized Preoxygenation: Pre-oxygenation with 100% oxygen Induction Type: IV induction, Rapid sequence and Cricoid Pressure applied Ventilation: Mask ventilation without difficulty Laryngoscope Size: Miller and 2 Grade View: Grade I Tube type: Oral Tube size: 7.5 mm Number of attempts: 1 Airway Equipment and Method: Stylet and Oral airway Placement Confirmation: ETT inserted through vocal cords under direct vision, positive ETCO2 and breath sounds checked- equal and bilateral Secured at: 23 cm Tube secured with: Tape Dental Injury: Teeth and Oropharynx as per pre-operative assessment

## 2020-09-07 NOTE — Progress Notes (Addendum)
PROGRESS NOTE    Kyle Simon  JGG:[id number] DOB: Jun 08, 1932 DOA: 09/06/2020 PCP: Lesleigh Noe, MD   Chief Complaint  Patient presents with   Nausea   Vomiting     Brief Narrative: 87 year old male with PMH of chronic constipation and no prior surgeries presents to the ED with 4 days of recurrent nausea and vomiting without abdominal pain or distension who was found to have high grade small bowel obstruction secondary to an adhesional omental hernia diagnosed at laparoscopy.  Subjective: NGT in place with 600 Ccs of bilious drainage. Abdomen is slightly distended. Patient endorses significant pain throughout the day. Denies any chest pain, shortness of breath, or urinary symptoms. Vitals are stable.   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  This is unusual as mesenteric defects due to intra-abdominal adhesions are usually due to trauma or prior surgeries - which patient has no history of. - He had a bowel movement after the laparoscopic repair. - Keep NGT in place at least 24 hours.   - Keep patient NPO at least 24 hours.   - Continue fluids, anti-emetics, protonix, pain meds as needed.   - Recheck Abdominal Xray in the morning.  Chronic Constipation: - Hold bowel regimen.  Mild AKI, pre-renal: - Creatinine improved from 1.2 to 1.0 mg/dL with baseline ~ 0.9 mg/dL> - Continue fluids and monitor.  Mild Hyponatremia, hypovolemic: - Check urine sodium and urine osmolality. -  Monitor sodium level with fluids.  Essential Hypertension: - BP stable.  Hold home meds.  Hyperlipidemia: - Restart Statin tomorrow.  Hypothyroidism: - Restart Synthroid tomorrow.  BPH:  - Restart Flomax tomorrow.  Osteoarthritis / Right TKA / Multiple Orthopedic Surgeries: - Tylenol PRN.  Lumber stenosis with neurogenic claudication / Multiple Spine Surgeries: - Pain meds PRN.  Diet Order             Diet NPO time specified  Diet effective now                         Patient's Body mass index is 27.26 kg/m.   DVT prophylaxis:  Code Status:   Code Status: DNR  Family Communication: plan of care discussed with patient at bedside.  Status is: Inpatient  Remains inpatient appropriate because:Inpatient level of care appropriate due to severity of illness  Dispo: The patient is from: Home              Anticipated d/c is to: Home              Patient currently is not medically stable to d/c.   Difficult to place patient No    Unresulted Labs (From admission, onward)     Start     Ordered   09/06/20 0434  Urinalysis, Complete w Microscopic  ONCE - STAT,   STAT        09/06/20 0433             Medications reviewed:  Scheduled Meds:  Chlorhexidine Gluconate Cloth  6 each Topical Once   diatrizoate meglumine-sodium  90 mL Per NG tube Once   ketorolac  15 mg Intravenous Q6H   pantoprazole (PROTONIX) IV  40 mg Intravenous Q24H   Continuous Infusions:  sodium chloride 75 mL/hr at 09/06/20 2117   sodium chloride      Consultants:see note  Procedures:see note  Antimicrobials: Anti-infectives (From admission, onward)    Start     Dose/Rate Route Frequency Ordered Stop   09/07/20 0600  ceFAZolin (ANCEF) IVPB 2g/100 mL premix        2 g 200 mL/hr over 30 Minutes Intravenous On call to O.R. 09/06/20 1332 09/07/20 1050      Culture/Microbiology    Component Value Date/Time   SDES URINE, CLEAN CATCH 12/31/2010 1135   SPECREQUEST NONE 12/31/2010  1135   CULT NO GROWTH 12/31/2010 1135   REPTSTATUS 01/01/2011 FINAL 12/31/2010 1135    Other culture-see note  Objective: Vitals: Today's Vitals   09/07/20 1300 09/07/20 1400 09/07/20 1553 09/07/20 1752  BP: 132/80 128/71 129/67   Pulse: 74 70 66   Resp: 17 16 17    Temp:  97.6 F (36.4 C) (!) 97.5 F (36.4 C)   TempSrc:  Oral    SpO2: 96% 95% 94%   Weight:      Height:      PainSc:    5     Intake/Output Summary (Last 24 hours) at 09/07/2020 1823 Last data filed at 09/07/2020 1700 Gross per 24 hour  Intake 1207.35 ml  Output 1110 ml  Net 97.35 ml   Filed Weights   09/06/20 0427  Weight: 86.2 kg   Weight change:   Intake/Output from previous day: 07/09 0701 - 07/10 0700 In: 427.4 [I.V.:427.4] Out: 850 [Urine:250; Emesis/NG output:600] Intake/Output this shift: Total I/O In: 780 [I.V.:750; IV Piggyback:30] Out: 260 [Emesis/NG output:250; Blood:10] Filed Weights   09/06/20 0427  Weight: 86.2 kg    Examination: General exam: alert and oriented, mental status intact, appears a little fatigued, NGT in place, mood pleasant HEENT: NCAT, PERRL, no icterus Respiratory system: CTAB no WRR Cardiovascular system: Did not appreciate a murmur, regular, No JVD. Gastrointestinal system: No flank pain, Abdomen soft, NT,ND, BS+. Nervous System: No focal deficits. Extremities: No edema, distal peripheral pulses palpable.  Skin: No rashes, No bruises, No icterus. MSK: Physical Deconditioning   Data Reviewed: I have personally reviewed following labs and imaging studies CBC: Recent Labs  Lab 09/06/20 0435 09/07/20 0504  WBC 16.4* 10.8*  HGB 14.7 13.0  HCT 43.9 37.4*  MCV 90.3 91.7  PLT 364 604   Basic Metabolic Panel: Recent Labs  Lab 09/06/20 0435 09/07/20 0504  NA 125* 130*  K 4.7 4.5  CL 89* 98  CO2 23 24  GLUCOSE 187* 102*  BUN 29* 34*  CREATININE 1.16 1.00  CALCIUM 9.4 8.7*  MG  --  2.2   GFR: Estimated Creatinine Clearance: 55.8 mL/min (by C-G  formula based on SCr of 1 mg/dL). Liver Function Tests: Recent Labs  Lab 09/06/20 0435  AST 24  ALT 18  ALKPHOS 75  BILITOT 1.7*  PROT 8.1  ALBUMIN 4.3   Recent Labs  Lab 09/06/20 0435  LIPASE 27    CBG: Recent Labs  Lab 09/06/20 2122 09/07/20 0529 09/07/20 1216  GLUCAP 128* 110* 125*     Recent Results (from the past 240 hour(s))  Surgical pcr screen     Status: None   Collection Time: 09/06/20  3:04 PM  Specimen: Nasal Mucosa; Nasal Swab  Result Value Ref Range Status   MRSA, PCR NEGATIVE NEGATIVE Final   Staphylococcus aureus NEGATIVE NEGATIVE Final    Comment: (NOTE) The Xpert SA Assay (FDA approved for NASAL specimens in patients 13 years of age and older), is one component of a comprehensive surveillance program. It is not intended to diagnose infection nor to guide or monitor treatment. Performed at [facility], [address], [place], [place] [address]      Radiology Studies: DG Chest Terr[place] 1 View  Result Date: 09/06/2020 CLINICAL DATA:  NG tube placement EXAM: PORTABLE CHEST 1 VIEW COMPARISON:  01/23/2019 FINDINGS: Esophagogastric tube with tip and side port below the diaphragm. No acute abnormality of the lungs. Mild cardiomegaly. IMPRESSION: 1. Esophagogastric tube with tip and side port below the diaphragm. 2. No acute abnormality of the lungs. Mild cardiomegaly. Electronically Signed   By: Eddie Candle M.D.   On: 09/06/2020 12:11   DG Abd Portable 1V  Result Date: 09/07/2020 CLINICAL DATA:  Small-bowel obstruction, abdominal pain and distension. EXAM: PORTABLE ABDOMEN - 1 VIEW COMPARISON:  Plain film of the abdomen dated 09/06/2020. CT abdomen and pelvis dated 09/06/2020. FINDINGS: Distended gas-filled loops of small bowel are again seen within the central to LEFT abdomen, measuring up to 3.8 cm diameter. Air and stool is seen within the RIGHT colon. No evidence of free intraperitoneal air. Lung bases appear clear. Fixation hardware in the  lower lumbar spine. IMPRESSION: Continued evidence of a partial small bowel obstruction. Electronically Signed   By: Franki Cabot M.D.   On: 09/07/2020 07:43   DG Abd Portable 1V-Small Bowel Protocol-Position Verification  Result Date: 09/06/2020 CLINICAL DATA:  Check gastric catheter placement EXAM: PORTABLE ABDOMEN - 1 VIEW COMPARISON:  None. FINDINGS: Gastric catheter is noted within the stomach. Proximal side port appears within the distal esophagus. This should be advanced several cm deeper into the stomach. Some mildly dilated small bowel loops are again identified. Postsurgical changes in the lumbar spine are seen. IMPRESSION: Gastric catheter as described. This should be advanced further into the stomach. Persistent small bowel dilatation is noted. Electronically Signed   By: Inez Catalina M.D.   On: 09/06/2020 22:56   CT Angio Abd/Pel W and/or Wo Contrast  Result Date: 09/06/2020 CLINICAL DATA:  Nausea and vomiting.  Concern for GI bleeding. EXAM: CTA ABDOMEN AND PELVIS WITHOUT AND WITH CONTRAST TECHNIQUE: Multidetector CT imaging of the abdomen and pelvis was performed using the standard protocol during bolus administration of intravenous contrast. Multiplanar reconstructed images and MIPs were obtained and reviewed to evaluate the vascular anatomy. CONTRAST:  64mL OMNIPAQUE IOHEXOL 350 MG/ML SOLN COMPARISON:  CT abdomen pelvis-08/28/2018; chest CT-12/06/2019; 10/05/2017 FINDINGS: VASCULAR Aorta: Moderate amount mixed calcified and noncalcified atherosclerotic plaque within normal caliber abdominal aorta, not resulting in a hemodynamically significant stenosis. No evidence of abdominal aortic dissection or periaortic stranding. Celiac: There is a minimal amount of eccentric calcified atherosclerotic plaque involving the cranial aspect of the origin the celiac artery, not resulting in a hemodynamically significant stenosis. Conventional branching pattern. SMA: There is a minimal amount of eccentric  calcified atherosclerotic plaque involving the origin of the SMA, not resulting in a hemodynamically significant stenosis. Conventional branching pattern. The distal tributaries the SMA appear widely patent without discrete lumen filling defect to suggest distal embolism. Renals: There are 3 discrete left-sided renal arteries. Solitary right renal artery. There is a minimal amount of calcified atherosclerotic plaque involving all renal arteries though not  definitely resulting in hemodynamically significant narrowing. No vessel irregularity to suggest FMD. IMA: Remains patent. Inflow: There is a minimal amount of calcified atherosclerotic plaque involving the bilateral common iliac arteries, not resulting in hemodynamically significant stenosis. The bilateral internal and external iliac arteries are tortuous though of normal caliber and widely patent without hemodynamically significant narrowing. Proximal Outflow: There is a minimal amount of eccentric calcified atherosclerotic plaque involving the right common femoral artery, not resulting in hemodynamically significant narrowing. The left common femoral artery is widely patent without hemodynamically significant narrowing. The bilateral deep and superficial femoral arteries are of normal caliber and widely patent throughout their imaged courses. Veins: The IVC and pelvic venous systems appear widely patent. Review of the MIP images confirms the above findings. _________________________________________________________ NON-VASCULAR Lower chest: Limited visualization of the lower thorax demonstrates an approximately 0.9 x 0.5 x 0.7 cm nodular opacity within the imaged left lower lobe (image 23, series 13; coronal image 117, series 11), potentially an area atelectasis though a discrete pulmonary nodule is not excluded on the basis of this examination and was not seen on the 11/2019 examination, the finding is associated with scattered short-segment occlusion of several  adjacent bronchi and thus may represent area of aspiration. Note is also made of unchanged punctate granuloma within the left lower lobe (images 20 and 43, series 13). Minimal dependent subpleural ground-glass atelectasis. No focal airspace opacities. No pleural effusion. Hepatobiliary: Normal hepatic contour. No discrete hepatic lesions. Layering gallstones are seen within a decompressed but otherwise normal-appearing gallbladder. No intra extrahepatic bili duct dilatation. No ascites. Pancreas: Pancreas remains atrophic. Spleen: Punctate granuloma within otherwise normal-appearing spleen. Punctate splenule is noted at the level of the hilum. Adrenals/Urinary Tract: Is symmetric enhancement of the bilateral kidneys. Note is again made of a right-sided parapelvic cyst. Redemonstrated geographic atrophy involving the inferolateral aspect of the left kidney (image 104, series 9). No evidence of nephrolithiasis on this postcontrast examination. No urinary obstruction or perinephric stranding. Normal appearance of the bilateral adrenal glands. There is mild thickening the urinary bladder wall, potentially accentuated underdistention. Stomach/Bowel: There are no discrete areas of intraluminal contrast extravasation to suggest GI bleeding. There is moderate distension of multiple loops of small bowel with discrete transition site located within the right mid abdomen (image 40, series 6; coronal image 64, series 9), with decompression of the downstream small bowel, findings concerning for a high-grade small bowel obstruction. The exact etiology of the transition is not identified and thus presumably secondary to adhesions. Normal appearance of the terminal ileum. The appendix is not visualized however there is no pericecal inflammatory change. Moderate colonic stool burden. Scattered colonic diverticulosis without evidence of superimposed acute diverticulitis. No pneumoperitoneum, pneumatosis or portal venous gas. No  significant hiatal hernia. Lymphatic: No bulky retroperitoneal, mesenteric, pelvic or inguinal lymphadenopathy. Reproductive: Prostate is borderline enlarged with minimal mass effect on the undersurface of the urinary bladder. No free fluid within the pelvic cul-de-sac. Other: Post bilateral inguinal hernia repair without evidence of recurrence. Tiny mesenteric fat containing periumbilical hernia. There is a minimal amount of subcutaneous edema about the midline of the low back. Musculoskeletal: No acute or aggressive osseous abnormalities. Post L3-L4 and L4-L5 intervertebral disc space replacement as well as the right lateral fusion of the L3-L4 intervertebral disc space and paraspinal fusion of L4-L5. No evidence of hardware failure or loosening. Mild kyphosis of the thoracolumbar junction without acute compression deformity. Mild degenerative change of the bilateral hips with joint space loss, subchondral sclerosis and osteophytosis.  IMPRESSION: VASCULAR 1. No evidence of intraluminal contrast extravasation to suggest GI bleeding. 2. Minimal amount of atherosclerotic plaque within normal caliber abdominal aorta, not resulting in a hemodynamically significant narrowing. NON-VASCULAR 1. Findings worrisome for high-grade small bowel obstruction with transition site located within the right mid hemiabdomen, the etiology of which is not depicted on this examination and thus presumably secondary to adhesions. 2. Cholelithiasis without evidence of cholecystitis. 3. Colonic diverticulosis without evidence of superimposed acute diverticulitis. 4. Potential punctate (0.5 cm) left lower lobe nodule versus area of nodular atelectasis/aspiration, not seen on the 10/20 21 examination. Consider one of the following in 3 months for both low-risk and high-risk individuals: (a) repeat chest CT, (b) follow-up PET-CT, or (c) tissue sampling. This recommendation follows the consensus statement: Guidelines for Management of Incidental  Pulmonary Nodules Detected on CT Images: From the Fleischner Society 2017; Radiology 2017; [id number]. Electronically Signed   By: Sandi Mariscal M.D.   On: 09/06/2020 08:35     LOS: 1 day   George Hugh, MD [facility]  09/07/2020, 6:23 PM

## 2020-09-07 NOTE — Anesthesia Postprocedure Evaluation (Signed)
Anesthesia Post Note  Patient: Kyle Simon  Procedure(s) Performed: XI ROBOTIC ASSISTED DIAGNOSTIC LAPAROSCOPY (Abdomen)  Patient location during evaluation: PACU Anesthesia Type: General Level of consciousness: awake and alert Pain management: pain level controlled Vital Signs Assessment: post-procedure vital signs reviewed and stable Respiratory status: spontaneous breathing, nonlabored ventilation, respiratory function stable and patient connected to nasal cannula oxygen Cardiovascular status: blood pressure returned to baseline and stable Postop Assessment: no apparent nausea or vomiting Anesthetic complications: no   No notable events documented.   Last Vitals:  Vitals:   09/07/20 1300 09/07/20 1400  BP: 132/80 128/71  Pulse: 74 70  Resp: 17 16  Temp:  36.4 C  SpO2: 96% 95%    Last Pain:  Vitals:   09/07/20 1400  TempSrc: Oral  PainSc:                  [name]

## 2020-09-07 NOTE — Transfer of Care (Signed)
Immediate Anesthesia Transfer of Care Note  Patient: XYLAN SHEILS  Procedure(s) Performed: XI ROBOTIC ASSISTED DIAGNOSTIC LAPAROSCOPY (Abdomen)  Patient Location: PACU  Anesthesia Type:General  Level of Consciousness: awake, alert  and oriented  Airway & Oxygen Therapy: Patient Spontanous Breathing  Post-op Assessment: Report given to RN and Post -op Vital signs reviewed and stable  Post vital signs: Reviewed and stable  Last Vitals:  Vitals Value Taken Time  BP 129/71 09/07/20 1137  Temp    Pulse 76 09/07/20 1141  Resp 16 09/07/20 1141  SpO2 97 % 09/07/20 1141  Vitals shown include unvalidated device data.  Last Pain:  Vitals:   09/06/20 2119  TempSrc:   PainSc: 0-No pain         Complications: No notable events documented.

## 2020-09-07 NOTE — TOC Progression Note (Signed)
Transition of Care [facility]) - Progression Note    Patient Details  Name: Kyle Simon MRN: [id number] Date of Birth: 08/20/1932  Transition of [facility]) CM/SW Contact  Izola Price, RN Phone Number: 09/07/2020, 5:05 PM  Clinical Narrative:     7/10: Admit 09/06/20. OR today for small bowel obstruction. NG tube in place. Simmie Davies RN CM        Expected Discharge Plan and Services                                                 Social Determinants of Health (SDOH) Interventions    Readmission Risk Interventions No flowsheet data found.

## 2020-09-07 NOTE — Op Note (Signed)
Robotic assisted enterolysis   Pre-operative Diagnosis: SBO   Post-operative Diagnosis: same   Procedure:  Robotic assisted laparoscopic enterolysis   Surgeon: Caroleen Hamman, MD FACS   Anesthesia: Gen. with endotracheal tube   Findings: Internal hernia caused by an adhesion from the omentum.  No evidence of other primary small bowel pathology   Estimated Blood Loss:5 cc             Complications: none     Procedure Details  The patient was seen again in the Holding Room. The benefits, complications, treatment options, and expected outcomes were discussed with the Family. The risks of bleeding, infection, recurrence of symptoms, failure to resolve symptoms, bowel injury, any of which could require further surgery were reviewed .   The  family concurred with the proposed plan, giving informed consent.  The patient was taken to Operating Room, identified  and the procedure verified. A Time Out was held and the above information confirmed.   Prior to the induction of general anesthesia, antibiotic prophylaxis was administered. VTE prophylaxis was in place. General endotracheal anesthesia was then administered and tolerated well. After the induction, the abdomen was prepped with Chloraprep and draped in the sterile fashion. The patient was positioned in the supine position.    I scrubbed out and went to the console. I Identified at Palmer's point and inserted a varies needle in the standard fashion.  Saline test and pressures confirm appropriate position of the needle. Pneumoperitoneum was then created with CO2 and tolerated well without any adverse changes in the patient's vital signs.  1 Optiview port was placed under direct vision.  , Three additional 8-mm ports were placed under direct vision.   The patient was positioned  in reverse Trendelenburg, robot was brought to the surgical field and docked in the standard fashion.  We made sure all the instrumentation was kept indirect view at  all times and that there were no collision between the arms. Inspection revealed evidence of transition point being created by an adhesion from the omentum.  There was evidence of an internal hernia created but there is omental adhesion.  There was clear-cut transition zone proximal and distal to this area.  Using tip up grasper I elevated the omentum and protected the small bowel.  Using scissors I performed lysis of adhesions sharply. Small bowel was run from the ligament of Treitz all the way to the cecum.  There was no evidence of bowel masses or other bowel pathology.  There was a small area of thickening of the mesentery adjacent to the loop of bowel that was trapped.  I ask anesthesia to inject ICG green to confirm the viability of the bowel and everything lighted up very nicely.   No bleeding, bile  injuries or bowel injury was noted.  Robotic instruments and robotic arms were undocked in the standard fashion.  I scrubbed back in. Liposomal Marcaine was used to infiltrate the abdominal wall at all incision sites   Pneumoperitoneum was released.  Liposomal Marcaine was injected in all incision sites.Marland Kitchen 4-0 subcuticular Monocryl was used to close the skin. Dermabond was  applied.  The patient was then extubated and brought to the recovery room in stable condition. Sponge, lap, and needle counts were correct at closure and at the conclusion of the case.               Caroleen Hamman, MD, FACS

## 2020-09-07 NOTE — Anesthesia Preprocedure Evaluation (Addendum)
Anesthesia Evaluation  Patient identified by MRN, date of birth, ID band Patient awake    Reviewed: Allergy & Precautions, NPO status , Patient's Chart, lab work & pertinent test results  History of Anesthesia Complications Negative for: history of anesthetic complications  Airway Mallampati: III  TM Distance: >3 FB Neck ROM: full    Dental  (+) Missing, Chipped   Pulmonary shortness of breath, former smoker,  +snoring   Pulmonary exam normal        Cardiovascular Exercise Tolerance: Good hypertension, + CAD  Normal cardiovascular exam  Hyperlipidemia   Neuro/Psych polio  Neuromuscular disease negative psych ROS   GI/Hepatic Neg liver ROS, GERD  ,SBO   Endo/Other  diabetes, Type 2Hypothyroidism SIADH (syndrome of inappropriate ADH production)  Renal/GU Renal diseaseCKD     Musculoskeletal  (+) Arthritis ,   Abdominal   Peds  Hematology negative hematology ROS (+) Blood dyscrasia, anemia ,   Anesthesia Other Findings Past Medical History: No date: Arthritis     Comment:  both hands;Dr.Deveshwar No date: Constipation due to pain medication No date: Diabetes mellitus without complication (HCC)     Comment:  Type II. Uses no medication. Pt controls with diet and               weight No date: Enlarged prostate No date: Frequent urination at night No date: GERD (gastroesophageal reflux disease) 11/20/2018: History of noncompliance with medical treatment,  presenting hazards to health No date: Hyperlipidemia 04/19/2007: Hyperlipidemia associated with type 2 diabetes mellitus  ([place])     Comment:  Qualifier: Diagnosis of  By: Linna Darner MD, Gwyndolyn Saxon   No date: Hypertension No date: Hypothyroidism No date: Polio     Comment:  as a child w/o complications No date: Snoring     Comment:  no sleep apnea 11/20/2018: Statin declined-      Comment:  patient understands risks associated with this decision               and  has been advised to restart by cardiologist as well               as myself several times No date: Torn meniscus 2012: Transfusion history     Comment:  post TKR  Past Surgical History: 11/03/2017: ANTERIOR LAT LUMBAR FUSION; Right     Comment:  Procedure: Right Lumbar three-four Anterolateral lumbar               interbody fusion with lateral plate;  Surgeon: Erline Levine, MD;  Location: [facility];  Service: Neurosurgery;                Laterality: Right; 2011: BACK SURGERY     Comment:  hemiarthroplasty 01/11/1999 and 6 09/2001: CARDIAC CATHETERIZATION     Comment:  negative No date: COLONOSCOPY 2003: EYE SURGERY     Comment:  R&L cataract removed & IOL- 2003 & 2007,Dr.Jenkins 2002: INGUINAL HERNIA REPAIR     Comment:  right, Dr. Truitt Leep 2012: JOINT REPLACEMENT     Comment:  left knee replacement No date: LIPOMA EXCISION     Comment:  back No date: MENISCECTOMY; Bilateral     Comment:  Dr. Noemi Chapel 07/2006: PROSTATE SURGERY     Comment:  for urinary urgency, Dr. Risa Grill October, 2016: [facility]; Right 1995: sinus & throat surgery-1995 No date: TONSILLECTOMY 01/11/2011: TOTAL KNEE ARTHROPLASTY     Comment:  Procedure: TOTAL KNEE ARTHROPLASTY;  Surgeon: Rudean Haskell, MD;  Location: [facility];  Service: Orthopedics;                Laterality: Left; 11/22/2016: TOTAL KNEE ARTHROPLASTY; Right     Comment:  Procedure: TOTAL KNEE ARTHROPLASTY;  Surgeon: Vickey Huger, MD;  Location: [facility];  Service: Orthopedics;                Laterality: Right; 1996: UVULECTOMY     Comment:  Dr. Erik Obey No date: VASECTOMY 1959: WRIST SURGERY     Comment:  fracture- right  BMI    Body Mass Index: 27.26 kg/m      Reproductive/Obstetrics negative OB ROS                            Anesthesia Physical Anesthesia Plan  ASA: 4 and emergent  Anesthesia Plan: General ETT and Rapid Sequence   Post-op Pain Management:     Induction: Intravenous  PONV Risk Score and Plan: Ondansetron, Dexamethasone, Midazolam and Treatment may vary due to age or medical condition  Airway Management Planned: Oral ETT  Additional Equipment:   Intra-op Plan:   Post-operative Plan: Extubation in OR and Possible Post-op intubation/ventilation  Informed Consent: I have reviewed the patients History and Physical, chart, labs and discussed the procedure including the risks, benefits and alternatives for the proposed anesthesia with the patient or authorized representative who has indicated his/her understanding and acceptance.   Patient has DNR.  Discussed DNR with patient and Suspend DNR.   Dental Advisory Given  Plan Discussed with: Anesthesiologist, CRNA and Surgeon  Anesthesia Plan Comments: (Patient consented for risks of anesthesia including but not limited to:  - adverse reactions to medications - damage to eyes, teeth, lips or other oral mucosa - nerve damage due to positioning  - sore throat or hoarseness - Damage to heart, brain, nerves, lungs, other parts of body or loss of life  Patient voiced understanding.)       Anesthesia Quick Evaluation

## 2020-09-07 NOTE — Progress Notes (Signed)
Bowel obstruction.  Patient seen and examined.  No abdominal pain no flatus.  NG tube output continues to be high and bilious.  X-ray this morning personally reviewed there is evidence of persistent central loop.  He has never had an abdominal operation in the past.  Again we went through his disease process this morning along with his family.  Given issues of high-grade obstructions on CT with lack of previous major abdominal operations and his age I will therefore recommend at least diagnostic laparoscopy.  He is in agreement with me.  Procedure discussed with patient detail.  Risks, benefits and possible implications including but not limited to: Bleeding, infection, chances of bowel resections, leaks, injuries,potential re interventions and chronic pain. He understands and wishes to proceed. Daughter and wife present during discussion.  All the questions were answered

## 2020-09-08 ENCOUNTER — Inpatient Hospital Stay: Payer: Medicare Other

## 2020-09-08 ENCOUNTER — Encounter: Payer: Self-pay | Admitting: Surgery

## 2020-09-08 LAB — GLUCOSE, CAPILLARY
Glucose-Capillary: 113 mg/dL — ABNORMAL HIGH (ref 70–99)
Glucose-Capillary: 122 mg/dL — ABNORMAL HIGH (ref 70–99)
Glucose-Capillary: 122 mg/dL — ABNORMAL HIGH (ref 70–99)
Glucose-Capillary: 123 mg/dL — ABNORMAL HIGH (ref 70–99)
Glucose-Capillary: 175 mg/dL — ABNORMAL HIGH (ref 70–99)
Glucose-Capillary: 93 mg/dL (ref 70–99)

## 2020-09-08 LAB — BASIC METABOLIC PANEL
Anion gap: 5 (ref 5–15)
BUN: 37 mg/dL — ABNORMAL HIGH (ref 8–23)
CO2: 25 mmol/L (ref 22–32)
Calcium: 8.4 mg/dL — ABNORMAL LOW (ref 8.9–10.3)
Chloride: 105 mmol/L (ref 98–111)
Creatinine, Ser: 0.89 mg/dL (ref 0.61–1.24)
GFR, Estimated: >60 mL/min (ref >60)
Glucose, Bld: 106 mg/dL — ABNORMAL HIGH (ref 70–99)
Potassium: 5.2 mmol/L — ABNORMAL HIGH (ref 3.5–5.1)
Sodium: 135 mmol/L (ref 135–145)

## 2020-09-08 LAB — CBC WITH DIFFERENTIAL/PLATELET
Abs Immature Granulocytes: 0.09 10*3/uL — ABNORMAL HIGH (ref 0.00–0.07)
Basophils Absolute: 0 10*3/uL (ref 0.0–0.1)
Basophils Relative: 0 %
Eosinophils Absolute: 0 10*3/uL (ref 0.0–0.5)
Eosinophils Relative: 0 %
HCT: 34.2 % — ABNORMAL LOW (ref 39.0–52.0)
Hemoglobin: 11.4 g/dL — ABNORMAL LOW (ref 13.0–17.0)
Immature Granulocytes: 1 %
Lymphocytes Relative: 7 %
Lymphs Abs: 0.9 10*3/uL (ref 0.7–4.0)
MCH: 30.8 pg (ref 26.0–34.0)
MCHC: 33.3 g/dL (ref 30.0–36.0)
MCV: 92.4 fL (ref 80.0–100.0)
Monocytes Absolute: 1.2 10*3/uL — ABNORMAL HIGH (ref 0.1–1.0)
Monocytes Relative: 10 %
Neutro Abs: 10.2 10*3/uL — ABNORMAL HIGH (ref 1.7–7.7)
Neutrophils Relative %: 82 %
Platelets: 250 10*3/uL (ref 150–400)
RBC: 3.7 MIL/uL — ABNORMAL LOW (ref 4.22–5.81)
RDW: 12.8 % (ref 11.5–15.5)
WBC: 12.3 10*3/uL — ABNORMAL HIGH (ref 4.0–10.5)
nRBC: 0 % (ref 0.0–0.2)

## 2020-09-08 MED ORDER — ENSURE ENLIVE PO LIQD
237.0000 mL | Freq: Two times a day (BID) | ORAL | Status: DC
  Administered 2020-09-08: 237 mL via ORAL

## 2020-09-08 MED ORDER — ROSUVASTATIN CALCIUM 5 MG PO TABS
5.0000 mg | ORAL_TABLET | Freq: Every day | ORAL | Status: DC
  Administered 2020-09-08: 5 mg via ORAL
  Filled 2020-09-08: qty 1

## 2020-09-08 MED ORDER — LEVOTHYROXINE SODIUM 50 MCG PO TABS
150.0000 ug | ORAL_TABLET | Freq: Every day | ORAL | Status: DC
  Administered 2020-09-09: 150 ug via ORAL
  Filled 2020-09-08: qty 3

## 2020-09-08 MED ORDER — CELECOXIB 200 MG PO CAPS
200.0000 mg | ORAL_CAPSULE | Freq: Every day | ORAL | Status: DC
  Administered 2020-09-08: 200 mg via ORAL
  Filled 2020-09-08: qty 1

## 2020-09-08 MED ORDER — ADULT MULTIVITAMIN W/MINERALS CH
1.0000 | ORAL_TABLET | Freq: Every day | ORAL | Status: DC
  Administered 2020-09-08 – 2020-09-09 (×2): 1 via ORAL
  Filled 2020-09-08 (×2): qty 1

## 2020-09-08 MED ORDER — LOSARTAN POTASSIUM 50 MG PO TABS
50.0000 mg | ORAL_TABLET | Freq: Every day | ORAL | Status: DC
  Administered 2020-09-08 – 2020-09-09 (×2): 50 mg via ORAL
  Filled 2020-09-08 (×2): qty 1

## 2020-09-08 MED ORDER — LOSARTAN POTASSIUM 50 MG PO TABS: 50.0000 mg | ORAL_TABLET | Freq: Every day | ORAL | Status: DC

## 2020-09-08 MED ORDER — TAMSULOSIN HCL 0.4 MG PO CAPS
0.4000 mg | ORAL_CAPSULE | Freq: Two times a day (BID) | ORAL | Status: DC
  Administered 2020-09-08 – 2020-09-09 (×2): 0.4 mg via ORAL
  Filled 2020-09-08 (×2): qty 1

## 2020-09-08 MED ORDER — ASPIRIN EC 81 MG PO TBEC
81.0000 mg | DELAYED_RELEASE_TABLET | Freq: Every evening | ORAL | Status: DC
  Administered 2020-09-08: 81 mg via ORAL
  Filled 2020-09-08: qty 1

## 2020-09-08 MED ORDER — ENOXAPARIN SODIUM 40 MG/0.4ML IJ SOSY: 40.0000 mg | PREFILLED_SYRINGE | INTRAMUSCULAR | Status: DC

## 2020-09-08 MED ORDER — SENNOSIDES-DOCUSATE SODIUM 8.6-50 MG PO TABS
2.0000 | ORAL_TABLET | Freq: Every evening | ORAL | Status: DC | PRN
  Administered 2020-09-08: 2 via ORAL
  Filled 2020-09-08: qty 2

## 2020-09-08 MED ORDER — ACETAMINOPHEN 325 MG PO TABS
650.0000 mg | ORAL_TABLET | Freq: Four times a day (QID) | ORAL | Status: DC | PRN
  Administered 2020-09-08: 650 mg via ORAL
  Filled 2020-09-08: qty 2

## 2020-09-08 NOTE — Progress Notes (Addendum)
PROGRESS NOTE    Kyle Simon  BOF:[id number] DOB: [birth year] DOA: 09/06/2020 PCP: Lesleigh Noe, MD   Chief Complaint  Patient presents with   Nausea   Vomiting     Brief Narrative: 87 year old male with PMH of chronic constipation and multiple inguinal hernia repair surgeries presents to the ED with 4 days of recurrent nausea and vomiting without abdominal pain or distension who was found to have high grade small bowel obstruction secondary to an adhesional omental hernia diagnosed at laparoscopy.  Subjective: NGT was removed this morning by surgery. Abdomen is benign. Patient is having bowel movements. Denies any significant pain.  He has multiple questions about his care. Daughter (nurse) is at bedside. All questions were answered.   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  Mesenteric defect due to intra-abdominal adhesions are likely secondary to patient's history of multiple inguinal repair surgeries. - NGT is out. - Abdomen is benign.  He is having bowel movements. - He is on clear liquid diet - advance as tolerated. - Fluids, Protonix, pain meds PRN. - Discharge is per General Surgery - likely tomorrow.  Chronic Constipation: - Restart bowel regimen as needed on discharge.  In response to coding query: patient does not have CKD3 -change code to CKD2. In response to coding query: AKI was mild - diagnosed was based on Creatinine 1.17 with baseline ~ 0.8 and urine output <  0.5 CC/kg/hr. Mild AKI on CKD2, pre-renal, resolved with fluids: - Creatinine is now at baseline.  In response to coding query: hyponatremia was due to mild dehydration and resolved with fluids.  Hyponatremia is not related to SIADH. Mild Hyponatremia, hypovolemic, resolved with fluids: - Sodium level improved from 130 to 135 mmol/L.  Essential Hypertension: - Losartan 50 mg. - Hold HCTZ as he receives fluids.  Hyperlipidemia: - Statin  Hypothyroidism: - Synthroid  BPH:  - Flomax  Osteoarthritis / Right TKA / Multiple Orthopedic Surgeries: - Celebrex and Tylenol PRN.  Lumber stenosis with neurogenic claudication / Multiple Spine Surgeries: - Pain meds PRN.  Diet Order             Diet full liquid Room service appropriate? Yes; Fluid consistency: Thin  Diet effective now                   Nutrition Problem: Increased nutrient needs Etiology: post-op healing, acute illness (SBO) Signs/Symptoms: estimated needs Interventions: Ensure Enlive (each supplement provides 350kcal and 20 grams of protein), Magic cup, MVI Patient's Body mass index is 27.26 kg/m.   DVT prophylaxis:  Code Status:   Code Status: DNR  Family Communication: plan of care discussed with patient at bedside.  Status is: Inpatient  Remains inpatient appropriate because:Inpatient level of care appropriate due to severity of illness  Dispo: The patient is from: Home              Anticipated d/c is to: Home              Patient currently is not medically  stable to d/c.   Difficult to place patient No    Unresulted Labs (From admission, onward)     Start     Ordered   09/08/20 0500  CBC with Differential/Platelet  Daily,   R     Question:  Specimen collection method  Answer:  Lab=Lab collect   09/07/20 1843   09/08/20 3893  Basic metabolic panel  Daily,   R     Question:  Specimen collection method  Answer:  Lab=Lab collect   09/07/20 1843   09/07/20 1843  Sodium, urine, random  Once,   R         09/07/20 1843   09/07/20 1843  Osmolality, urine  Once,   R        09/07/20 1843   09/06/20 0434  Urinalysis, Complete w Microscopic  ONCE - STAT,   STAT        09/06/20 0433             Medications reviewed:  Scheduled Meds:  aspirin EC  81 mg Oral QPM   celecoxib  200 mg Oral Daily   Chlorhexidine Gluconate Cloth  6 each Topical Once   diatrizoate meglumine-sodium  90 mL Per NG tube Once   feeding supplement  237 mL Oral BID BM   ketorolac  15 mg Intravenous Q6H   [START ON 09/09/2020] levothyroxine  150 mcg Oral Q0600   losartan  50 mg Oral Daily   multivitamin with minerals  1 tablet Oral Daily   pantoprazole (PROTONIX) IV  40 mg Intravenous Q24H   rosuvastatin  5 mg Oral QHS   tamsulosin  0.4 mg Oral BID   Continuous Infusions:  sodium chloride 50 mL/hr at 09/08/20 1036    Consultants:see note  Procedures:see note  Antimicrobials: Anti-infectives (From admission, onward)    Start     Dose/Rate Route Frequency Ordered Stop   09/07/20 0600  ceFAZolin (ANCEF) IVPB 2g/100 mL premix        2 g 200 mL/hr over 30 Minutes Intravenous On call to O.R. 09/06/20 1332 09/07/20 1050      Culture/Microbiology    Component Value Date/Time   SDES URINE, CLEAN CATCH 12/31/2010 1135   SPECREQUEST NONE 12/31/2010 1135   CULT NO GROWTH 12/31/2010 1135   REPTSTATUS 01/01/2011 FINAL 12/31/2010 1135    Other culture-see note  Objective: Vitals: Today's Vitals   09/08/20 0321 09/08/20 0750 09/08/20 1100 09/08/20 1129  BP: (!) 173/75 (!) 149/55  134/64  Pulse: (!) 53 (!) 47  70  Resp: 19 15  18   Temp: 98.1 F (36.7 C) 98.2 F (36.8 C)  98.2 F (36.8 C)  TempSrc:      SpO2: 96% 100%  98%  Weight:      Height:      PainSc:   0-No pain     Intake/Output Summary (Last 24 hours) at 09/08/2020 1331 Last data filed at 09/08/2020 0600 Gross per 24 hour  Intake 263.22 ml  Output 800 ml  Net -536.78 ml    Filed Weights   09/06/20 0427  Weight: 86.2 kg   Weight  change:   Intake/Output from previous day: 07/10 0701 - 07/11 0700 In: 1043.2 [I.V.:1013.2; IV Piggyback:30] Out: 810 [Urine:350; Emesis/NG output:450; Blood:10] Intake/Output this shift: No intake/output data recorded. Filed Weights   09/06/20 0427  Weight: 86.2 kg    Examination: General exam: alert and oriented, mental status intact, appears a little fatigued, NGT in place, mood pleasant HEENT:  NCAT, PERRL, no icterus Respiratory system: CTAB no WRR Cardiovascular system: Did not appreciate a murmur, regular, No JVD. Gastrointestinal system: No flank pain, Abdomen soft, NT,ND, BS+. Nervous System: No focal deficits. Extremities: No edema, distal peripheral pulses palpable.  Skin: No rashes, No bruises, No icterus. MSK: Physical Deconditioning   Data Reviewed: I have personally reviewed following labs and imaging studies CBC: Recent Labs  Lab 09/06/20 0435 09/07/20 0504 09/08/20 0559  WBC 16.4* 10.8* 12.3*  NEUTROABS  --   --  10.2*  HGB 14.7 13.0 11.4*  HCT 43.9 37.4* 34.2*  MCV 90.3 91.7 92.4  PLT 364 285 762    Basic Metabolic Panel: Recent Labs  Lab 09/06/20 0435 09/07/20 0504 09/08/20 0559  NA 125* 130* 135  K 4.7 4.5 5.2*  CL 89* 98 105  CO2 23 24 25   GLUCOSE 187* 102* 106*  BUN 29* 34* 37*  CREATININE 1.16 1.00 0.89  CALCIUM 9.4 8.7* 8.4*  MG  --  2.2  --     GFR: Estimated Creatinine Clearance: 62.7 mL/min (by C-G formula based on SCr of 0.89 mg/dL). Liver Function Tests: Recent Labs  Lab 09/06/20 0435  AST 24  ALT 18  ALKPHOS 75  BILITOT 1.7*  PROT 8.1  ALBUMIN 4.3    Recent Labs  Lab 09/06/20 0435  LIPASE 27     CBG: Recent Labs  Lab 09/07/20 1939 09/08/20 0043 09/08/20 0317 09/08/20 0751 09/08/20 1130  GLUCAP 156* 122* 123* 93 122*      Recent Results (from the past 240 hour(s))  Surgical pcr screen     Status: None   Collection Time: 09/06/20  3:04 PM   Specimen: Nasal Mucosa; Nasal Swab  Result Value Ref  Range Status   MRSA, PCR NEGATIVE NEGATIVE Final   Staphylococcus aureus NEGATIVE NEGATIVE Final    Comment: (NOTE) The Xpert SA Assay (FDA approved for NASAL specimens in patients 15 years of age and older), is one component of a comprehensive surveillance program. It is not intended to diagnose infection nor to guide or monitor treatment. Performed at [facility], [address] [place], [place] [address]       Radiology Studies: DG Abd 1 View  Result Date: 09/08/2020 CLINICAL DATA:  Small bowel obstruction EXAM: ABDOMEN - 1 VIEW COMPARISON:  09/07/2020 FINDINGS: NG tube is in the stomach. Stool and gas noted throughout the colon. Decreasing small bowel distention since prior study. No free air or organomegaly. IMPRESSION: Decreasing small bowel distention compatible with improving small-bowel obstruction. Gas and moderate stool throughout the colon. Electronically Signed   By: Rolm Baptise M.D.   On: 09/08/2020 08:19   DG Abd Portable 1V  Result Date: 09/07/2020 CLINICAL DATA:  Small-bowel obstruction, abdominal pain and distension. EXAM: PORTABLE ABDOMEN - 1 VIEW COMPARISON:  Plain film of the abdomen dated 09/06/2020. CT abdomen and pelvis dated 09/06/2020. FINDINGS: Distended gas-filled loops of small bowel are again seen within the central to LEFT abdomen, measuring up to 3.8 cm diameter. Air and stool is seen within the RIGHT colon. No evidence of free intraperitoneal air. Lung bases appear clear. Fixation hardware in the lower lumbar spine. IMPRESSION: Continued evidence of a partial small bowel obstruction. Electronically Signed   By: Franki Cabot M.D.   On: 09/07/2020 07:43   DG Abd Portable 1V-Small Bowel Protocol-Position Verification  Result Date: 09/06/2020 CLINICAL DATA:  Check gastric catheter placement EXAM: PORTABLE ABDOMEN - 1 VIEW COMPARISON:  None. FINDINGS: Gastric catheter  is noted within the stomach. Proximal side port appears within the distal esophagus.  This should be advanced several cm deeper into the stomach. Some mildly dilated small bowel loops are again identified. Postsurgical changes in the lumbar spine are seen. IMPRESSION: Gastric catheter as described. This should be advanced further into the stomach. Persistent small bowel dilatation is noted. Electronically Signed   By: Inez Catalina M.D.   On: 09/06/2020 22:56     LOS: 2 days   George Hugh, MD [facility]  09/08/2020, 1:31 PM

## 2020-09-08 NOTE — Progress Notes (Signed)
Initial Nutrition Assessment  DOCUMENTATION CODES:  Not applicable  INTERVENTION:  Continue to advance diet as tolerated.  Add Ensure Enlive po BID, each supplement provides 350 kcal and 20 grams of protein.  Add Magic cup TID with meals, each supplement provides 290 kcal and 9 grams of protein.  Add MVI with minerals daily.  NUTRITION DIAGNOSIS:  Increased nutrient needs related to post-op healing, acute illness (SBO) as evidenced by estimated needs.  GOAL:  Patient will meet greater than or equal to 90% of their needs  MONITOR:  PO intake, Supplement acceptance, Diet advancement, Labs, Weight trends, Skin, I & O's  REASON FOR ASSESSMENT:  Malnutrition Screening Tool    ASSESSMENT:  87 yo male with a PMH of chronic constipation, T2DM, HTN, HLD, hypothyroidism, BPH, osteoarthritis, and lumbar stenosis with neurogenic claudication s/p multiple spine surgeries who presents with SBO. 7/10 - robotic assisted laparoscopic lysis of adhesions for SBO  NGT output: 450 ml in 24 hrs UOP: 350 ml in 24 hrs  Spoke with pt at bedside. Pt reports not having had anything to eat 5-6 days. He reports that he is very hungry now and would appreciate anything. MD just left the room and advanced his diet from clears to fulls. He looks forward to lunch. Wife and daughter endorse that before pt was sick, he was eating very well.  Pt reports a few pound change over the last couple of days with not being able to eat, but he expects his weight to increase again once he can eat. Per Epic, pt has lost ~9 lbs (4.7%) in 5 months, which is not necessarily significant for the time frame.  On exam, pt had few age related depletions.  Recommend adding Ensure Enlive BID and Magic Cup TID to aid in pt's caloric and protein intake.  Medications: reviewed; Gastrografin, Protonix, NaCl @ 50 ml/hr via IV  Labs: reviewed; K 5.2 (H), CBG 93-156 HbA1c: 6.5% (04/2020)  NUTRITION - FOCUSED PHYSICAL EXAM: Flowsheet  Row Most Recent Value  Orbital Region No depletion  Upper Arm Region Moderate depletion  Thoracic and Lumbar Region No depletion  Buccal Region No depletion  Temple Region Mild depletion  Clavicle Bone Region No depletion  Clavicle and Acromion Bone Region No depletion  Scapular Bone Region No depletion  Dorsal Hand No depletion  Patellar Region No depletion  Anterior Thigh Region No depletion  Posterior Calf Region No depletion  Edema (RD Assessment) None  Hair Reviewed  Eyes Reviewed  Mouth Reviewed  Skin Reviewed  Nails Reviewed   Diet Order:   Diet Order             Diet full liquid Room service appropriate? Yes; Fluid consistency: Thin  Diet effective now                  EDUCATION NEEDS:  Education needs have been addressed  Skin:  Skin Assessment: Skin Integrity Issues: Skin Integrity Issues:: Incisions Incisions: L abdomen, closed  Last BM:  undocumented  Height:  Ht Readings from Last 1 Encounters:  09/06/20 5\' 10"  (1.778 m)   Weight:  Wt Readings from Last 1 Encounters:  09/06/20 86.2 kg   BMI:  Body mass index is 27.26 kg/m.  Estimated Nutritional Needs:  Kcal:  2000-2200 Protein:  95-110 grams Fluid:  >2 L  Derrel Nip, RD, LDN (she/her/hers) Registered Dietitian I After-Hours/Weekend Pager # in [place]

## 2020-09-08 NOTE — Progress Notes (Signed)
[facility] Hospital Day(s): 2.   Post op day(s): 1 Day Post-Op.   Interval History:  Patient seen and examined No acute events or new complaints overnight.  Patient reports he is feeling much better this morning; he is hungry  No abdominal pain, fever, chills, nausea, emesis Slight bump in leukocytosis to 12.3K; likely reactive from OR Renal function normal; sCr - 0.89; UO - 350 ccs Mild hyperkalemia to 5.2 NGT output recorded at 450 ccs He does endorse passing flatus   Vital signs in last 24 hours: [min-max] current  Temp:  [97.1 F (36.2 C)-98.2 F (36.8 C)] 98.1 F (36.7 C) (07/11 0321) Pulse Rate:  [53-79] 53 (07/11 0321) Resp:  [16-44] 19 (07/11 0321) BP: (124-173)/(67-80) 173/75 (07/11 0321) SpO2:  [93 %-97 %] 96 % (07/11 0321)     Height: 5\' 10"  (177.8 cm) Weight: 86.2 kg BMI (Calculated): 27.26   Intake/Output last 2 shifts:  07/10 0701 - 07/11 0700 In: 1043.2 [I.V.:1013.2; IV Piggyback:30] Out: 55 [Urine:350; Emesis/NG output:450; Blood:10]   Physical Exam:  Constitutional: alert, cooperative and no distress  HEENT: NGT in place (removed) Respiratory: breathing non-labored at rest  Cardiovascular: regular rate and sinus rhythm  Gastrointestinal: soft, non-tender, and non-distended, no rebound/guarding Integumentary: Laparoscopic incisions are CDI with dermabond, no erythema or drainage   Labs:  CBC Latest Ref Rng & Units 09/08/2020 09/07/2020 09/06/2020  WBC 4.0 - 10.5 K/uL 12.3(H) 10.8(H) 16.4(H)  Hemoglobin 13.0 - 17.0 g/dL 11.4(L) 13.0 14.7  Hematocrit 39.0 - 52.0 % 34.2(L) 37.4(L) 43.9  Platelets 150 - 400 K/uL 250 285 364   CMP Latest Ref Rng & Units 09/08/2020 09/07/2020 09/06/2020  Glucose 70 - 99 mg/dL 106(H) 102(H) 187(H)  BUN 8 - 23 mg/dL 37(H) 34(H) 29(H)  Creatinine 0.61 - 1.24 mg/dL 0.89 1.00 1.16  Sodium 135 - 145 mmol/L 135 130(L) 125(L)  Potassium 3.5 - 5.1 mmol/L 5.2(H) 4.5 4.7  Chloride 98 - 111  mmol/L 105 98 89(L)  CO2 22 - 32 mmol/L 25 24 23   Calcium 8.9 - 10.3 mg/dL 8.4(L) 8.7(L) 9.4  Total Protein 6.5 - 8.1 g/dL - - 8.1  Total Bilirubin 0.3 - 1.2 mg/dL - - 1.7(H)  Alkaline Phos 38 - 126 U/L - - 75  AST 15 - 41 U/L - - 24  ALT 0 - 44 U/L - - 18     Imaging studies:   KUB (09/08/2020) personally reviewed improved small bowel appearance, gas throughout colon, and radiologist report reviewed:  IMPRESSION: Decreasing small bowel distention compatible with improving small-bowel obstruction. Gas and moderate stool throughout the colon.   Assessment/Plan:  87 y.o. male with ROBF 1 Day Post-Op s/p robotic assisted laparoscopic lysis of adhesions for SBO.   - Checked NGT residual at bedside; <10 ccs. NGT removed without issues - Will initiate CLD; advance as tolerated   - Wean from IVF as diet advances   - Monitor abdominal examination; on-going bowel function - Pain control prn; antiemetics prn  - Mobilization as tolerated - Further management per primary service; we will follow    All of the above findings and recommendations were discussed with the patient, patient's family (daughter at bedside), and the medical team, and all of patient's and family's questions were answered to their expressed satisfaction.  -- Edison Simon, PA-C [facility] 09/08/2020, 7:23 AM [phone] M-F: 7am - 4pm

## 2020-09-09 LAB — CBC WITH DIFFERENTIAL/PLATELET
Abs Immature Granulocytes: 0.04 10*3/uL (ref 0.00–0.07)
Abs Immature Granulocytes: 0.03 10*3/uL (ref 0.00–0.07)
Basophils Absolute: 0 10*3/uL (ref 0.0–0.1)
Basophils Absolute: 0 10*3/uL (ref 0.0–0.1)
Basophils Relative: 0 %
Basophils Relative: 0 %
Eosinophils Absolute: 0.1 10*3/uL (ref 0.0–0.5)
Eosinophils Absolute: 0.1 10*3/uL (ref 0.0–0.5)
Eosinophils Relative: 1 %
Eosinophils Relative: 1 %
HCT: 29.1 % — ABNORMAL LOW (ref 39.0–52.0)
HCT: 32 % — ABNORMAL LOW (ref 39.0–52.0)
Hemoglobin: 9.9 g/dL — ABNORMAL LOW (ref 13.0–17.0)
Hemoglobin: 10.6 g/dL — ABNORMAL LOW (ref 13.0–17.0)
Immature Granulocytes: 1 %
Immature Granulocytes: 0 %
Lymphocytes Relative: 16 %
Lymphocytes Relative: 15 %
Lymphs Abs: 1.2 10*3/uL (ref 0.7–4.0)
Lymphs Abs: 1.1 10*3/uL (ref 0.7–4.0)
MCH: 30.9 pg (ref 26.0–34.0)
MCH: 31.2 pg (ref 26.0–34.0)
MCHC: 34 g/dL (ref 30.0–36.0)
MCHC: 33.1 g/dL (ref 30.0–36.0)
MCV: 90.9 fL (ref 80.0–100.0)
MCV: 94.1 fL (ref 80.0–100.0)
Monocytes Absolute: 0.8 10*3/uL (ref 0.1–1.0)
Monocytes Absolute: 0.8 10*3/uL (ref 0.1–1.0)
Monocytes Relative: 11 %
Monocytes Relative: 11 %
Neutro Abs: 5.2 10*3/uL (ref 1.7–7.7)
Neutro Abs: 5.2 10*3/uL (ref 1.7–7.7)
Neutrophils Relative %: 71 %
Neutrophils Relative %: 73 %
Platelets: 226 10*3/uL (ref 150–400)
Platelets: 226 10*3/uL (ref 150–400)
RBC: 3.2 MIL/uL — ABNORMAL LOW (ref 4.22–5.81)
RBC: 3.4 MIL/uL — ABNORMAL LOW (ref 4.22–5.81)
RDW: 12.7 % (ref 11.5–15.5)
RDW: 12.9 % (ref 11.5–15.5)
WBC: 7.3 10*3/uL (ref 4.0–10.5)
WBC: 7.3 10*3/uL (ref 4.0–10.5)
nRBC: 0 % (ref 0.0–0.2)
nRBC: 0 % (ref 0.0–0.2)

## 2020-09-09 LAB — BASIC METABOLIC PANEL
Anion gap: 5 (ref 5–15)
BUN: 31 mg/dL — ABNORMAL HIGH (ref 8–23)
CO2: 23 mmol/L (ref 22–32)
Calcium: 7.7 mg/dL — ABNORMAL LOW (ref 8.9–10.3)
Chloride: 102 mmol/L (ref 98–111)
Creatinine, Ser: 0.83 mg/dL (ref 0.61–1.24)
GFR, Estimated: >60 mL/min (ref >60)
Glucose, Bld: 93 mg/dL (ref 70–99)
Potassium: 4.3 mmol/L (ref 3.5–5.1)
Sodium: 130 mmol/L — ABNORMAL LOW (ref 135–145)

## 2020-09-09 LAB — GLUCOSE, CAPILLARY
Glucose-Capillary: 95 mg/dL (ref 70–99)
Glucose-Capillary: 105 mg/dL — ABNORMAL HIGH (ref 70–99)
Glucose-Capillary: 88 mg/dL (ref 70–99)
Glucose-Capillary: 99 mg/dL (ref 70–99)

## 2020-09-09 MED ORDER — ENSURE ENLIVE PO LIQD: 237.0000 mL | Freq: Two times a day (BID) | ORAL | 12 refills | Status: DC

## 2020-09-09 MED ORDER — PANTOPRAZOLE SODIUM 40 MG PO TBEC: 40.0000 mg | DELAYED_RELEASE_TABLET | Freq: Every day | ORAL | 0 refills | Status: DC

## 2020-09-09 MED ORDER — POLYETHYLENE GLYCOL 3350 17 G PO PACK: 17.0000 g | PACK | Freq: Every day | ORAL | 0 refills | Status: DC

## 2020-09-09 NOTE — Discharge Instructions (Signed)
In addition to included general post-operative instructions,  Diet: Resume home diet.   Activity: No heavy lifting >20 pounds (children, pets, laundry, garbage) or strenuous activity for 4 weeks, but light activity and walking are encouraged. Do not drive or drink alcohol if taking narcotic pain medications or having pain that might distract from driving.  Wound care: You may shower/get incision wet with soapy water and pat dry (do not rub incisions), but no baths or submerging incision underwater until follow-up.   Medications: Resume all home medications. For mild to moderate pain: acetaminophen (Tylenol) or ibuprofen/naproxen (if no kidney disease). Combining Tylenol with alcohol can substantially increase your risk of causing liver disease. Narcotic pain medications, if prescribed, can be used for severe pain, though may cause nausea, constipation, and drowsiness. Do not combine Tylenol and Percocet (or similar) within a 6 hour period as Percocet (and similar) contain(s) Tylenol. If you do not need the narcotic pain medication, you do not need to fill the prescription.  Call office [phone] / [phone]) at any time if any questions, worsening pain, fevers/chills, bleeding, drainage from incision site, or other concerns.

## 2020-09-09 NOTE — Care Management Important Message (Signed)
Important Message  Patient Details  Name: JESTON JUNKINS MRN: [id number] Date of Birth: 11/13/1932   Medicare Important Message Given:  Yes     Juliann Pulse A Danetra Glock 09/09/2020, 2:16 PM

## 2020-09-09 NOTE — Discharge Summary (Addendum)
[facility] Discharge Summary   Patient: Kyle Simon GYJ:[id number]  PCP: Lesleigh Noe, MD  Date of admission: 09/06/2020   Date of discharge:  09/09/2020     Discharge Diagnoses:  Principal Problem:   SBO (small bowel obstruction) ([place]) Active Problems:   Hypothyroidism   GERD (gastroesophageal reflux disease)   h/o Hyponatremia   DM assoc with CKD (chronic kidney disease), stage III (HCC)   SIADH (syndrome of inappropriate ADH production) ([place])   Admitted From: home Disposition:  Home  Recommendations for Outpatient Follow-up:  PCP: Follow-up with PCP as well as general surgery. Follow up LABS/TEST: CBC in 1 week.   Follow-up Information     Tylene Fantasia, PA-C. Schedule an appointment as soon as possible for a visit in 2 week(s).   Specialty: Physician Assistant Why: s/p laparoscopic lysis of adhesions Contact information: [address] [place] [address] [phone]         Lesleigh Noe, MD. Schedule an appointment as soon as possible for a visit in 1 week(s).   Specialty: Family Medicine Contact information: [address] [place] [address] [phone]                Discharge Instructions     Diet - low sodium heart healthy   Complete by: As directed    Increase activity slowly   Complete by: As directed    No wound care   Complete by: As directed        Diet recommendation: Cardiac diet  Activity: The patient is advised to gradually reintroduce usual activities, as tolerated  Discharge Condition: stable  Code Status: DNR   History of present illness: As per the H and P dictated on admission, "Kyle Simon is a 87 y.o. male with medical history significant for diet-controlled diabetes mellitus with complications of stage III chronic kidney disease, hypertension, dyslipidemia, hypothyroidism who presents to the ER via private vehicle for evaluation of a 3-day history of nausea and  vomiting. Patient states his symptoms started 3 days ago after he ate a cup of clam chowder from his neighbor.  He has had persistent nausea and emesis and has been unable to tolerate any oral intake since then.  He denies having any abdominal pain and his last bowel movement was 1 day prior to his admission.  He usually has daily bowel movements.  He is not able to pass flatus.  Due to the persistence of his symptoms he decided to come to the emergency room to get checked. He denies having any hematemesis, no hematochezia or melena stools, no fever, no chills, no cough, no dizziness, no lightheadedness, no headache, no focal deficits, no palpitations or diaphoresis. CT angiogram of the abdomen and pelvis shows findings worrisome for high-grade small bowel obstruction with transition site located within the right mid hemiabdomen, the etiology of which is not depicted on this examination and thus presumably secondary to adhesions. Cholelithiasis without evidence of cholecystitis. Colonic diverticulosis without evidence of superimposed acute diverticulitis.Potential punctate (0.5 cm) left lower lobe nodule versus area of nodular atelectasis/aspiration, not seen on the 10/20 21 examination.  Twelve-lead EKG reviewed by me shows sinus rhythm with PACs"  Hospital Course:  Summary of his active problems in the hospital is as following. Acute Small Bowel Obstruction secondary to an adhesional omental hernia s/p laparoscopic repair:  Presents with abdominal pain along with nausea and vomiting. General surgery consulted, was taken to the OR. On laparoscopy,  patient was found to have a loop of small bowel passing through the omental hernia resulting in a significant small bowel obstruction.  Mesenteric defect due to intra-abdominal adhesions are likely secondary to patient's history of multiple inguinal repair surgeries. NG tube was inserted postop.  Removed and currently tolerating oral diet. Pain well  controlled. Wound management and pain management per surgery.  Acute postop blood loss anemia. Baseline hemoglobin around 14. Through the hospital stay hemoglobin dropped from 14-9.9. Repeat hemoglobin remained stable around 10.6. No active bleeding. Outpatient follow-up with PCP in 1 week with repeat CBC.   Chronic Constipation: Restart bowel regimen as needed on discharge.  Switch Citrucel to MiraLAX.   CKD2. Mild renal insufficiency.  Does not meet AKI criteria. Serum creatinine close to baseline of 0.83.   Chronic hyponatremia. From poor p.o. intake. Sodium level improved.  Better than baseline.   Essential Hypertension: Losartan 50 mg.   Hyperlipidemia: Statin   Hypothyroidism: Synthroid   BPH: Flomax   Osteoarthritis / Right TKA / Multiple Orthopedic Surgeries: Celebrex with meals. And Tylenol PRN.   Lumber stenosis with neurogenic claudication / Multiple Spine Surgeries: Pain meds PRN.  As above.  Body mass index is 27.26 kg/m.  Nutrition Problem: Increased nutrient needs Etiology: post-op healing, acute illness (SBO) Nutrition Interventions: Interventions: Ensure Enlive (each supplement provides 350kcal and 20 grams of protein), Magic cup, MVI      Patient was ambulatory without any assistance. On the day of the discharge the patient's vitals were stable, and no other new acute medical condition were reported. The patient was felt safe to be discharge at Home with no therapy needed on discharge.  Consultants: General surgery  Procedures: Robotic assisted laparoscopic enterolysis on 09/07/2020  DISCHARGE MEDICATION: Allergies as of 09/09/2020       Reactions   Simvastatin Other (See Comments)   MYALGIAS   Other    Other reaction(s): Unknown Other reaction(s): Unknown   Oxycodone Other (See Comments)   Severe constipation        Medication List     STOP taking these medications    CITRUCEL PO       TAKE these medications     acetaminophen 500 MG tablet Commonly known as: TYLENOL Take 1,000 mg by mouth every 6 (six) hours as needed for mild pain. Takes 2 tablets nightly Notes to patient: Last dose given 09/08/2020 at 6:48pm.   ARTIFICIAL TEARS OP Place 1 drop into both eyes 2 (two) times daily. Notes to patient: Not given while in hospital.    aspirin 81 MG tablet Take 81 mg by mouth every evening. Notes to patient: Last dose given 09/09/2020 at 6:49pm   blood glucose meter kit and supplies Kit Inject 1 each into the skin as directed. Dispense based on patient and insurance preference. Use up to four times daily as directed. (FOR ICD-9 250.00, 250.01).   CALCIUM 600 + D PO Take 1 tablet by mouth daily. Notes to patient: Not given while in hospital.    celecoxib 200 MG capsule Commonly known as: CELEBREX Take 200 mg by mouth daily. Notes to patient: Last dose given 09/08/2020 at 1:02pm.   diclofenac Sodium 1 % Gel Commonly known as: VOLTAREN Apply 2 g topically at bedtime. Notes to patient: Not given while in hospital.   feeding supplement Liqd Take 237 mLs by mouth 2 (two) times daily between meals. Notes to patient: Last dose given 09/08/2020 at 12:54pm   Fish Oil 1000 MG Caps Take 1,000  mg by mouth every evening. Notes to patient: Not given while in hospital.    glucose blood test strip Commonly known as: PRECISION XTRA TEST STRIPS USE TO CHECK BLOOD SUGAR ONCE DAILY DX E11.9   Lancets Ultra Thin 30G Misc Patient is to use to check blood glucose in the AM fasting and 2 hours after largest meal.   levothyroxine 150 MCG tablet Commonly known as: Synthroid Take 1 tablet (150 mcg total) by mouth daily before breakfast. Take 1.5 tablet (225 mcg) once a week What changed: additional instructions Notes to patient: Last dose given 09/09/2020 at 05:08am   losartan 50 MG tablet Commonly known as: COZAAR Take 1 tablet (50 mg total) by mouth daily. Notes to patient: Last dose given  09/09/2020 at 10:12pm   multivitamin with minerals Tabs tablet Take 1 tablet by mouth daily. Notes to patient: Last dose given 09/09/2020 at 10:12pm   NONFORMULARY OR COMPOUNDED ITEM Glucometer (Precision XTRA- Model #[id number])   pantoprazole 40 MG tablet Commonly known as: Protonix Take 1 tablet (40 mg total) by mouth daily for 14 days. Notes to patient: Last dose given 09/08/2020 at 12:53pm   polyethylene glycol 17 g packet Commonly known as: MiraLax Take 17 g by mouth daily. Notes to patient: Not given while in hospital.    Restora Caps One tab qd What changed:  how much to take how to take this when to take this additional instructions Notes to patient: Not given while in hospital.    rosuvastatin 5 MG tablet Commonly known as: Crestor Take 1 tablet (5 mg total) by mouth at bedtime. Notes to patient: Last dose given 09/08/2020 at 8:21pm   tamsulosin 0.4 MG Caps capsule Commonly known as: FLOMAX Take 0.4 mg by mouth 2 (two) times daily. Notes to patient: Last dose given 09/09/2020 at 10:12am   vitamin B-12 1000 MCG tablet Commonly known as: CYANOCOBALAMIN Take 1,000 mcg by mouth daily. Notes to patient: Not given while in hospital.         Discharge Exam: Filed Weights   09/06/20 0427  Weight: 86.2 kg   Vitals:   09/09/20 0816 09/09/20 1157  BP: 131/82 138/75  Pulse: (!) 51 (!) 48  Resp: 20 20  Temp: 97.6 F (36.4 C) 97.7 F (36.5 C)  SpO2: 98%    General: Appear in no distress, no Rash; Oral Mucosa Clear, moist. no Abnormal Neck Mass Or lumps, Conjunctiva normal  Cardiovascular: S1 and S2 Present, no Murmur Respiratory: good respiratory effort, Bilateral Air entry present and CTA, no Crackles, no wheezes Abdomen: Bowel Sound present, Soft and no tenderness Extremities: no Pedal edema Neurology: alert and oriented to time, place, and person affect appropriate. no new focal deficit   The results of significant diagnostics from this  hospitalization (including imaging, microbiology, ancillary and laboratory) are listed below for reference.    Significant Diagnostic Studies: DG Abd 1 View  Result Date: 09/08/2020 CLINICAL DATA:  Small bowel obstruction EXAM: ABDOMEN - 1 VIEW COMPARISON:  09/07/2020 FINDINGS: NG tube is in the stomach. Stool and gas noted throughout the colon. Decreasing small bowel distention since prior study. No free air or organomegaly. IMPRESSION: Decreasing small bowel distention compatible with improving small-bowel obstruction. Gas and moderate stool throughout the colon. Electronically Signed   By: Rolm Baptise M.D.   On: 09/08/2020 08:19   DG Chest Port 1 View  Result Date: 09/06/2020 CLINICAL DATA:  NG tube placement EXAM: PORTABLE CHEST 1 VIEW COMPARISON:  01/23/2019 FINDINGS: Esophagogastric  tube with tip and side port below the diaphragm. No acute abnormality of the lungs. Mild cardiomegaly. IMPRESSION: 1. Esophagogastric tube with tip and side port below the diaphragm. 2. No acute abnormality of the lungs. Mild cardiomegaly. Electronically Signed   By: Eddie Candle M.D.   On: 09/06/2020 12:11   DG Abd Portable 1V  Result Date: 09/07/2020 CLINICAL DATA:  Small-bowel obstruction, abdominal pain and distension. EXAM: PORTABLE ABDOMEN - 1 VIEW COMPARISON:  Plain film of the abdomen dated 09/06/2020. CT abdomen and pelvis dated 09/06/2020. FINDINGS: Distended gas-filled loops of small bowel are again seen within the central to LEFT abdomen, measuring up to 3.8 cm diameter. Air and stool is seen within the RIGHT colon. No evidence of free intraperitoneal air. Lung bases appear clear. Fixation hardware in the lower lumbar spine. IMPRESSION: Continued evidence of a partial small bowel obstruction. Electronically Signed   By: Franki Cabot M.D.   On: 09/07/2020 07:43   DG Abd Portable 1V-Small Bowel Protocol-Position Verification  Result Date: 09/06/2020 CLINICAL DATA:  Check gastric catheter placement EXAM:  PORTABLE ABDOMEN - 1 VIEW COMPARISON:  None. FINDINGS: Gastric catheter is noted within the stomach. Proximal side port appears within the distal esophagus. This should be advanced several cm deeper into the stomach. Some mildly dilated small bowel loops are again identified. Postsurgical changes in the lumbar spine are seen. IMPRESSION: Gastric catheter as described. This should be advanced further into the stomach. Persistent small bowel dilatation is noted. Electronically Signed   By: Inez Catalina M.D.   On: 09/06/2020 22:56   CT Angio Abd/Pel W and/or Wo Contrast  Result Date: 09/06/2020 CLINICAL DATA:  Nausea and vomiting.  Concern for GI bleeding. EXAM: CTA ABDOMEN AND PELVIS WITHOUT AND WITH CONTRAST TECHNIQUE: Multidetector CT imaging of the abdomen and pelvis was performed using the standard protocol during bolus administration of intravenous contrast. Multiplanar reconstructed images and MIPs were obtained and reviewed to evaluate the vascular anatomy. CONTRAST:  29m OMNIPAQUE IOHEXOL 350 MG/ML SOLN COMPARISON:  CT abdomen pelvis-08/28/2018; chest CT-12/06/2019; 10/05/2017 FINDINGS: VASCULAR Aorta: Moderate amount mixed calcified and noncalcified atherosclerotic plaque within normal caliber abdominal aorta, not resulting in a hemodynamically significant stenosis. No evidence of abdominal aortic dissection or periaortic stranding. Celiac: There is a minimal amount of eccentric calcified atherosclerotic plaque involving the cranial aspect of the origin the celiac artery, not resulting in a hemodynamically significant stenosis. Conventional branching pattern. SMA: There is a minimal amount of eccentric calcified atherosclerotic plaque involving the origin of the SMA, not resulting in a hemodynamically significant stenosis. Conventional branching pattern. The distal tributaries the SMA appear widely patent without discrete lumen filling defect to suggest distal embolism. Renals: There are 3 discrete  left-sided renal arteries. Solitary right renal artery. There is a minimal amount of calcified atherosclerotic plaque involving all renal arteries though not definitely resulting in hemodynamically significant narrowing. No vessel irregularity to suggest FMD. IMA: Remains patent. Inflow: There is a minimal amount of calcified atherosclerotic plaque involving the bilateral common iliac arteries, not resulting in hemodynamically significant stenosis. The bilateral internal and external iliac arteries are tortuous though of normal caliber and widely patent without hemodynamically significant narrowing. Proximal Outflow: There is a minimal amount of eccentric calcified atherosclerotic plaque involving the right common femoral artery, not resulting in hemodynamically significant narrowing. The left common femoral artery is widely patent without hemodynamically significant narrowing. The bilateral deep and superficial femoral arteries are of normal caliber and widely patent throughout their imaged courses. Veins:  The IVC and pelvic venous systems appear widely patent. Review of the MIP images confirms the above findings. _________________________________________________________ NON-VASCULAR Lower chest: Limited visualization of the lower thorax demonstrates an approximately 0.9 x 0.5 x 0.7 cm nodular opacity within the imaged left lower lobe (image 23, series 13; coronal image 117, series 11), potentially an area atelectasis though a discrete pulmonary nodule is not excluded on the basis of this examination and was not seen on the 11/2019 examination, the finding is associated with scattered short-segment occlusion of several adjacent bronchi and thus may represent area of aspiration. Note is also made of unchanged punctate granuloma within the left lower lobe (images 20 and 43, series 13). Minimal dependent subpleural ground-glass atelectasis. No focal airspace opacities. No pleural effusion. Hepatobiliary: Normal  hepatic contour. No discrete hepatic lesions. Layering gallstones are seen within a decompressed but otherwise normal-appearing gallbladder. No intra extrahepatic bili duct dilatation. No ascites. Pancreas: Pancreas remains atrophic. Spleen: Punctate granuloma within otherwise normal-appearing spleen. Punctate splenule is noted at the level of the hilum. Adrenals/Urinary Tract: Is symmetric enhancement of the bilateral kidneys. Note is again made of a right-sided parapelvic cyst. Redemonstrated geographic atrophy involving the inferolateral aspect of the left kidney (image 104, series 9). No evidence of nephrolithiasis on this postcontrast examination. No urinary obstruction or perinephric stranding. Normal appearance of the bilateral adrenal glands. There is mild thickening the urinary bladder wall, potentially accentuated underdistention. Stomach/Bowel: There are no discrete areas of intraluminal contrast extravasation to suggest GI bleeding. There is moderate distension of multiple loops of small bowel with discrete transition site located within the right mid abdomen (image 40, series 6; coronal image 64, series 9), with decompression of the downstream small bowel, findings concerning for a high-grade small bowel obstruction. The exact etiology of the transition is not identified and thus presumably secondary to adhesions. Normal appearance of the terminal ileum. The appendix is not visualized however there is no pericecal inflammatory change. Moderate colonic stool burden. Scattered colonic diverticulosis without evidence of superimposed acute diverticulitis. No pneumoperitoneum, pneumatosis or portal venous gas. No significant hiatal hernia. Lymphatic: No bulky retroperitoneal, mesenteric, pelvic or inguinal lymphadenopathy. Reproductive: Prostate is borderline enlarged with minimal mass effect on the undersurface of the urinary bladder. No free fluid within the pelvic cul-de-sac. Other: Post bilateral  inguinal hernia repair without evidence of recurrence. Tiny mesenteric fat containing periumbilical hernia. There is a minimal amount of subcutaneous edema about the midline of the low back. Musculoskeletal: No acute or aggressive osseous abnormalities. Post L3-L4 and L4-L5 intervertebral disc space replacement as well as the right lateral fusion of the L3-L4 intervertebral disc space and paraspinal fusion of L4-L5. No evidence of hardware failure or loosening. Mild kyphosis of the thoracolumbar junction without acute compression deformity. Mild degenerative change of the bilateral hips with joint space loss, subchondral sclerosis and osteophytosis. IMPRESSION: VASCULAR 1. No evidence of intraluminal contrast extravasation to suggest GI bleeding. 2. Minimal amount of atherosclerotic plaque within normal caliber abdominal aorta, not resulting in a hemodynamically significant narrowing. NON-VASCULAR 1. Findings worrisome for high-grade small bowel obstruction with transition site located within the right mid hemiabdomen, the etiology of which is not depicted on this examination and thus presumably secondary to adhesions. 2. Cholelithiasis without evidence of cholecystitis. 3. Colonic diverticulosis without evidence of superimposed acute diverticulitis. 4. Potential punctate (0.5 cm) left lower lobe nodule versus area of nodular atelectasis/aspiration, not seen on the 10/20 21 examination. Consider one of the following in 3 months for both low-risk  and high-risk individuals: (a) repeat chest CT, (b) follow-up PET-CT, or (c) tissue sampling. This recommendation follows the consensus statement: Guidelines for Management of Incidental Pulmonary Nodules Detected on CT Images: From the Fleischner Society 2017; Radiology 2017; 284:228-243. Electronically Signed   By: Sandi Mariscal M.D.   On: 09/06/2020 08:35    Microbiology: Recent Results (from the past 240 hour(s))  Surgical pcr screen     Status: None   Collection  Time: 09/06/20  3:04 PM   Specimen: Nasal Mucosa; Nasal Swab  Result Value Ref Range Status   MRSA, PCR NEGATIVE NEGATIVE Final   Staphylococcus aureus NEGATIVE NEGATIVE Final    Comment: (NOTE) The Xpert SA Assay (FDA approved for NASAL specimens in patients 8 years of age and older), is one component of a comprehensive surveillance program. It is not intended to diagnose infection nor to guide or monitor treatment. Performed at [facility], [address] [place], [place] [address]      Labs: CBC: Recent Labs  Lab 09/06/20 0435 09/07/20 0504 09/08/20 0559 09/09/20 0451 09/09/20 1150  WBC 16.4* 10.8* 12.3* 7.3 7.3  NEUTROABS  --   --  10.2* 5.2 5.2  HGB 14.7 13.0 11.4* 9.9* 10.6*  HCT 43.9 37.4* 34.2* 29.1* 32.0*  MCV 90.3 91.7 92.4 90.9 94.1  PLT 364 285 250 226 376   Basic Metabolic Panel: Recent Labs  Lab 09/06/20 0435 09/07/20 0504 09/08/20 0559 09/09/20 0451  NA 125* 130* 135 130*  K 4.7 4.5 5.2* 4.3  CL 89* 98 105 102  CO2 _0 GLUCOSE 187* 102* 106* 93  BUN 29* 34* 37* 31*  CREATININE 1.16 1.00 0.89 0.83  CALCIUM 9.4 8.7* 8.4* 7.7*  MG  --  2.2  --   --    Liver Function Tests: Recent Labs  Lab 09/06/20 0435  AST 24  ALT 18  ALKPHOS 75  BILITOT 1.7*  PROT 8.1  ALBUMIN 4.3   CBG: Recent Labs  Lab 09/08/20 1959 09/09/20 0116 09/09/20 0507 09/09/20 0812 09/09/20 1232  GLUCAP 113* 95 105* 88 99    Time spent: 35 minutes  Signed:  Berle Mull  [facility]  09/09/2020

## 2020-09-09 NOTE — Progress Notes (Signed)
[facility] Hospital Day(s): 3.   Post op day(s): 2 Days Post-Op.   Interval History:  Patient seen and examined No acute events or new complaints overnight.  Patient reports he is feeling much better No abdominal pain, nausea, emesis, fever Leukocytosis resolved; now 7.3 Hgb to 9.9; suspect this is dilutional in nature Renal function normal; sCr - 0.83; UO - unmeasured Mild hyponatremia to 130; o/w no significant electrolyte derangements  He has been advanced to soft; tolerating well  Having bowel function; BM in last 24 hours  Vital signs in last 24 hours: [min-max] current  Temp:  [97.9 F (36.6 C)-98.2 F (36.8 C)] 97.9 F (36.6 C) (07/12 0552) Pulse Rate:  [47-70] 49 (07/12 0552) Resp:  [15-18] 17 (07/12 0552) BP: (130-149)/(55-69) 140/69 (07/12 0552) SpO2:  [97 %-100 %] 97 % (07/12 0552)     Height: 5\' 10"  (177.8 cm) Weight: 86.2 kg BMI (Calculated): 27.26   Intake/Output last 2 shifts:  07/11 0701 - 07/12 0700 In: 975.9 [P.O.:500; I.V.:475.9] Out: -    Physical Exam:  Constitutional: alert, cooperative and no distress  Respiratory: breathing non-labored at rest Cardiovascular: regular rate and sinus rhythm Gastrointestinal: soft, non-tender, and non-distended, no rebound/guarding Integumentary: Laparoscopic incisions are CDI with dermabond, no erythema or drainage   Labs:  CBC Latest Ref Rng & Units 09/09/2020 09/08/2020 09/07/2020  WBC 4.0 - 10.5 K/uL 7.3 12.3(H) 10.8(H)  Hemoglobin 13.0 - 17.0 g/dL 9.9(L) 11.4(L) 13.0  Hematocrit 39.0 - 52.0 % 29.1(L) 34.2(L) 37.4(L)  Platelets 150 - 400 K/uL 226 250 285   CMP Latest Ref Rng & Units 09/09/2020 09/08/2020 09/07/2020  Glucose 70 - 99 mg/dL 93 106(H) 102(H)  BUN 8 - 23 mg/dL 31(H) 37(H) 34(H)  Creatinine 0.61 - 1.24 mg/dL 0.83 0.89 1.00  Sodium 135 - 145 mmol/L 130(L) 135 130(L)  Potassium 3.5 - 5.1 mmol/L 4.3 5.2(H) 4.5  Chloride 98 - 111 mmol/L 102 105 98  CO2 22 - 32  mmol/L 23 25 24   Calcium 8.9 - 10.3 mg/dL 7.7(L) 8.4(L) 8.7(L)  Total Protein 6.5 - 8.1 g/dL - - -  Total Bilirubin 0.3 - 1.2 mg/dL - - -  Alkaline Phos 38 - 126 U/L - - -  AST 15 - 41 U/L - - -  ALT 0 - 44 U/L - - -     Imaging studies: No new pertinent imaging studies   Assessment/Plan:  87 y.o. male with ROBF 2 Days Post-Op s/p robotic assisted laparoscopic lysis of adhesions for SBO.   - Continue soft/regular diet  - Monitor abdominal examination; on-going bowel function - Pain control prn; antiemetics prn             - Mobilization as tolerated - Further management per primary service    - Discharge Planning: Okay for  discharge from surgical perspective; follow up in 2 weeks; reviewed discharge instructions. I will update these and make follow up appointment.   All of the above findings and recommendations were discussed with the patient, and the medical team, and all of patient's questions were answered to his expressed satisfaction.  -- Edison Simon, PA-C [facility] 09/09/2020, 7:04 AM [phone] M-F: 7am - 4pm

## 2020-09-09 NOTE — Progress Notes (Signed)
Pt d/c home via [place] with wife.  D/c paperwork reviewed with pt and wife and both expressed understanding.  All belongings taken at time of d/c.  IV removed from pts L wrist without issue.  NAD noted VSS.  Incisions to abdomen intact no s/s of infection.

## 2020-09-10 ENCOUNTER — Telehealth: Payer: Self-pay

## 2020-09-10 NOTE — Telephone Encounter (Signed)
Noted will see next week 

## 2020-09-10 NOTE — Telephone Encounter (Signed)
Transition Care Management Unsuccessful Follow-up Telephone Call  Date of discharge and from where:  09/09/2020, [facility]  Attempts:  1st Attempt  Reason for unsuccessful TCM follow-up call:  Left voice message

## 2020-09-10 NOTE — Telephone Encounter (Signed)
Transition Care Management Follow-up Telephone Call Date of discharge and from where: 09/09/20, [facility] How have you been since you were released from the hospital? Patient is doing much better. Any questions or concerns? No  Items Reviewed: Did the pt receive and understand the discharge instructions provided? Yes  Medications obtained and verified? Yes  Other? No  Any new allergies since your discharge? No  Dietary orders reviewed? Yes Do you have support at home? Yes   Home Care and Equipment/Supplies: Were home health services ordered? not applicable If so, what is the name of the agency? N/A  Has the agency set up a time to come to the patient's home? not applicable Were any new equipment or medical supplies ordered?  No What is the name of the medical supply agency? N/A Were you able to get the supplies/equipment? not applicable Do you have any questions related to the use of the equipment or supplies? No  Functional Questionnaire: (I = Independent and D = Dependent) ADLs: I  Bathing/Dressing- I  Meal Prep- I  Eating- I  Maintaining continence- I  Transferring/Ambulation- I  Managing Meds- I  Follow up appointments reviewed:  PCP Hospital f/u appt confirmed? Yes  Scheduled to see Dr. Einar Pheasant on 09/16/2020 @ 10:40 am. Specialist Hospital f/u appt confirmed? Yes  Scheduled to see general surgery  Are transportation arrangements needed? No  If their condition worsens, is the pt aware to call PCP or go to the Emergency Dept.? Yes Was the patient provided with contact information for the PCP's office or ED? Yes Was to pt encouraged to call back with questions or concerns? Yes

## 2020-09-10 NOTE — Telephone Encounter (Signed)
Transition Care Management Unsuccessful Follow-up Telephone Call  Date of discharge and from where:  09/09/2020, [facility]  Attempts:  2nd Attempt  Reason for unsuccessful TCM follow-up call:  Left voice message

## 2020-09-10 NOTE — Telephone Encounter (Signed)
Pt called returning your call 

## 2020-09-16 ENCOUNTER — Ambulatory Visit (INDEPENDENT_AMBULATORY_CARE_PROVIDER_SITE_OTHER): Payer: Medicare Other | Admitting: Family Medicine

## 2020-09-16 ENCOUNTER — Encounter: Payer: Self-pay | Admitting: Family Medicine

## 2020-09-16 ENCOUNTER — Ambulatory Visit: Payer: Medicare Other | Admitting: Family Medicine

## 2020-09-16 ENCOUNTER — Ambulatory Visit (INDEPENDENT_AMBULATORY_CARE_PROVIDER_SITE_OTHER): Payer: Medicare Other | Admitting: Dermatology

## 2020-09-16 ENCOUNTER — Other Ambulatory Visit: Payer: Self-pay

## 2020-09-16 ENCOUNTER — Other Ambulatory Visit: Payer: Self-pay | Admitting: Family Medicine

## 2020-09-16 VITALS — BP 114/72 | HR 62 | Temp 97.4°F | Ht 70.0 in | Wt 185.0 lb

## 2020-09-16 DIAGNOSIS — Z1283 Encounter for screening for malignant neoplasm of skin: Secondary | ICD-10-CM

## 2020-09-16 DIAGNOSIS — E785 Hyperlipidemia, unspecified: Secondary | ICD-10-CM

## 2020-09-16 DIAGNOSIS — D509 Iron deficiency anemia, unspecified: Secondary | ICD-10-CM | POA: Diagnosis not present

## 2020-09-16 DIAGNOSIS — K5900 Constipation, unspecified: Secondary | ICD-10-CM | POA: Diagnosis not present

## 2020-09-16 DIAGNOSIS — E1159 Type 2 diabetes mellitus with other circulatory complications: Secondary | ICD-10-CM

## 2020-09-16 DIAGNOSIS — R202 Paresthesia of skin: Secondary | ICD-10-CM | POA: Diagnosis not present

## 2020-09-16 DIAGNOSIS — I152 Hypertension secondary to endocrine disorders: Secondary | ICD-10-CM

## 2020-09-16 DIAGNOSIS — K56609 Unspecified intestinal obstruction, unspecified as to partial versus complete obstruction: Secondary | ICD-10-CM | POA: Diagnosis not present

## 2020-09-16 DIAGNOSIS — E1169 Type 2 diabetes mellitus with other specified complication: Secondary | ICD-10-CM

## 2020-09-16 MED ORDER — LOSARTAN POTASSIUM 50 MG PO TABS: 50.0000 mg | ORAL_TABLET | Freq: Every day | ORAL | 3 refills | Status: DC

## 2020-09-16 MED ORDER — ROSUVASTATIN CALCIUM 5 MG PO TABS
5.0000 mg | ORAL_TABLET | Freq: Every day | ORAL | 3 refills | Status: DC
Start: 2020-09-16 — End: 2021-07-31

## 2020-09-16 NOTE — Progress Notes (Signed)
Subjective:     Kyle Simon is a 87 y.o. male presenting for Hospitalization Follow-up     HPI  #LLQ pain - on the lower side - radiates to the groin - did notice some blood on the t-shirt - had robotic surgery - started looking and only had one that was red - for the most part they look - pain is new - started yesterday - BM this morning - pebble sized with some straining - using miralax w/o improvement - once daily - was told to stop citracel  - no nausea/vomiting - doing Ok eating   Just feels down a little bit Did feel good for a few days but has been feeling down for a the last few days Not as active as he wants to be   Wife notes - no energy - using cane - needs more time to move about   Review of Systems  7/9-7/01/2021: Admission - SBO - laproscopic surgery with small bowel through the omental hernia. NG tube placed. Anemia 2/2 to blood loss stable.   Social History   Tobacco Use  Smoking Status Former   Packs/day: 1.00   Years: 18.00   Pack years: 18.00   Types: Cigarettes   Quit date: 03/01/1966   Years since quitting: 54.5  Smokeless Tobacco Never        Objective:    BP Readings from Last 3 Encounters:  09/16/20 114/72  09/09/20 138/75  06/30/20 112/68   Wt Readings from Last 3 Encounters:  09/16/20 185 lb (83.9 kg)  09/06/20 190 lb (86.2 kg)  06/30/20 192 lb 6.4 oz (87.3 kg)    BP 114/72   Pulse 62   Temp (!) 97.4 F (36.3 C) (Temporal)   Ht 5\' 10"  (1.778 m)   Wt 185 lb (83.9 kg)   BMI 26.54 kg/m    Physical Exam Constitutional:      Appearance: Normal appearance. He is not ill-appearing or diaphoretic.  HENT:     Right Ear: External ear normal.     Left Ear: External ear normal.     Nose: Nose normal.  Eyes:     General: No scleral icterus.    Extraocular Movements: Extraocular movements intact.     Conjunctiva/sclera: Conjunctivae normal.  Cardiovascular:     Rate and Rhythm: Normal rate and regular rhythm.   Pulmonary:     Effort: Pulmonary effort is normal.     Breath sounds: Normal breath sounds.  Abdominal:     General: Abdomen is flat. Bowel sounds are normal. There is no distension.     Palpations: Abdomen is soft.     Tenderness: There is abdominal tenderness in the suprapubic area.     Comments: Surgical scars with dried blood, no erythema or active bleeding  Musculoskeletal:     Cervical back: Neck supple.  Skin:    General: Skin is warm and dry.  Neurological:     Mental Status: He is alert. Mental status is at baseline.  Psychiatric:        Mood and Affect: Mood normal.          Assessment & Plan:   Problem List Items Addressed This Visit       Cardiovascular and Mediastinum   Hypertension associated with diabetes ([place]) (Chronic)    BP controled. Cont losartan 50 mg. Monitor labs.        Relevant Medications   losartan (COZAAR) 50 MG tablet   rosuvastatin (CRESTOR) 5  MG tablet   Other Relevant Orders   Basic metabolic panel     Digestive   SBO (small bowel obstruction) ([facility])    Some abdominal pain which I suspect is 2/2 constipation vs overactivity given recent surgery. No signs of obstructions and incisions healing well.          Endocrine   Hyperlipidemia associated with type 2 diabetes mellitus ([place]) (Chronic)    Lab Results  Component Value Date   LDLCALC 49 02/16/2019  Recheck today. At goal. Cont rosuvastatin 5 mg      Relevant Medications   losartan (COZAAR) 50 MG tablet   rosuvastatin (CRESTOR) 5 MG tablet   Other Relevant Orders   Lipid panel   Type 2 diabetes mellitus with other specified complication ([place])    Remains diet controlled. Recheck today       Relevant Medications   losartan (COZAAR) 50 MG tablet   rosuvastatin (CRESTOR) 5 MG tablet   Other Relevant Orders   Hemoglobin A1c     Other   Anemia, unspecified - Primary (Chronic)    Acute blood loss anemia 2/2 to surgery. Recheck today. Discussed it could take 3 months to  fully recover and this is likely contributing to fatigue. No signs of active bleeding       Relevant Orders   CBC   Constipation    Poorly controlled. Suspect this could be contributing to his abdominal pain. Increase miralax to BID and TID if needed. Work for soft, easy to pass stools         Return if symptoms worsen or fail to improve.  Lesleigh Noe, MD  This visit occurred during the SARS-CoV-2 public health emergency.  Safety protocols were in place, including screening questions prior to the visit, additional usage of staff PPE, and extensive cleaning of exam room while observing appropriate contact time as indicated for disinfecting solutions.

## 2020-09-16 NOTE — Assessment & Plan Note (Signed)
BP controled. Cont losartan 50 mg. Monitor labs.

## 2020-09-16 NOTE — Patient Instructions (Addendum)
Constipation - increase miralax to twice daily - if still hard to pass stools after 2-3 days, increase to three times daily    It may take 6-12 weeks to get fully back to normal  Listen to your body. If you get pain, slow down

## 2020-09-16 NOTE — Assessment & Plan Note (Signed)
Acute blood loss anemia 2/2 to surgery. Recheck today. Discussed it could take 3 months to fully recover and this is likely contributing to fatigue. No signs of active bleeding

## 2020-09-16 NOTE — Assessment & Plan Note (Signed)
Some abdominal pain which I suspect is 2/2 constipation vs overactivity given recent surgery. No signs of obstructions and incisions healing well.

## 2020-09-16 NOTE — Assessment & Plan Note (Signed)
Lab Results  Component Value Date   LDLCALC 49 02/16/2019   Recheck today. At goal. Cont rosuvastatin 5 mg

## 2020-09-16 NOTE — Assessment & Plan Note (Signed)
Poorly controlled. Suspect this could be contributing to his abdominal pain. Increase miralax to BID and TID if needed. Work for soft, easy to pass stools

## 2020-09-16 NOTE — Assessment & Plan Note (Signed)
Remains diet controlled. Recheck today

## 2020-09-17 DIAGNOSIS — Z20822 Contact with and (suspected) exposure to covid-19: Secondary | ICD-10-CM | POA: Diagnosis not present

## 2020-09-17 LAB — BASIC METABOLIC PANEL
BUN: 11 mg/dL (ref 6–23)
CO2: 25 mEq/L (ref 19–32)
Calcium: 9.2 mg/dL (ref 8.4–10.5)
Chloride: 99 mEq/L (ref 96–112)
Creatinine, Ser: 0.98 mg/dL (ref 0.40–1.50)
GFR: 70.29 mL/min (ref >60.00)
Glucose, Bld: 95 mg/dL (ref 70–99)
Potassium: 5 mEq/L (ref 3.5–5.1)
Sodium: 133 mEq/L — ABNORMAL LOW (ref 135–145)

## 2020-09-17 LAB — CBC
HCT: 36.1 % — ABNORMAL LOW (ref 39.0–52.0)
Hemoglobin: 12.2 g/dL — ABNORMAL LOW (ref 13.0–17.0)
MCHC: 33.7 g/dL (ref 30.0–36.0)
MCV: 93.3 fl (ref 78.0–100.0)
Platelets: 268 10*3/uL (ref 150.0–400.0)
RBC: 3.87 Mil/uL — ABNORMAL LOW (ref 4.22–5.81)
RDW: 13.3 % (ref 11.5–15.5)
WBC: 6.2 10*3/uL (ref 4.0–10.5)

## 2020-09-17 LAB — HEMOGLOBIN A1C: Hgb A1c MFr Bld: 7.1 % — ABNORMAL HIGH (ref 4.6–6.5)

## 2020-09-17 LAB — LIPID PANEL
Cholesterol: 118 mg/dL (ref 0–200)
HDL: 48.6 mg/dL (ref >39.00)
LDL Cholesterol: 47 mg/dL (ref 0–99)
NonHDL: 69.76
Total CHOL/HDL Ratio: 2
Triglycerides: 114 mg/dL (ref 0.0–149.0)
VLDL: 22.8 mg/dL (ref 0.0–40.0)

## 2020-09-24 ENCOUNTER — Encounter: Payer: Self-pay | Admitting: Dermatology

## 2020-09-24 ENCOUNTER — Ambulatory Visit (INDEPENDENT_AMBULATORY_CARE_PROVIDER_SITE_OTHER): Payer: Medicare Other | Admitting: Physician Assistant

## 2020-09-24 ENCOUNTER — Other Ambulatory Visit: Payer: Self-pay

## 2020-09-24 ENCOUNTER — Encounter: Payer: Self-pay | Admitting: Physician Assistant

## 2020-09-24 VITALS — BP 105/67 | HR 71 | Temp 97.8°F | Ht 70.0 in | Wt 186.0 lb

## 2020-09-24 DIAGNOSIS — Z09 Encounter for follow-up examination after completed treatment for conditions other than malignant neoplasm: Secondary | ICD-10-CM

## 2020-09-24 DIAGNOSIS — K56609 Unspecified intestinal obstruction, unspecified as to partial versus complete obstruction: Secondary | ICD-10-CM

## 2020-09-24 NOTE — Progress Notes (Signed)
[facility] POST-OP OFFICE VISIT  09/24/2020  HPI: Kyle Simon is a 87 y.o. male 17 days s/p robotic assisted laparoscopic lysis of adhesion for SBO with Dr Dahlia Byes.   He is doing reasonably well He reports that he has felt more fatigued since surgery  No reports of abdominal pain, nausea, emesis He does note he has been taking Miralax twice daily and his stools are very loose. He is interested in switching back to Dulcolax as this has worked better for him in the past.  No issues with incisions Overall doing well  Vital signs: BP 105/67   Pulse 71   Temp 97.8 F (36.6 C)   Ht '5\' 10"'$  (1.778 m)   Wt 186 lb (84.4 kg)   BMI 26.69 kg/m    Physical Exam: Constitutional: Well appearing male, NAD Abdomen: Soft, non-tender, non-distended, no rebound/guarding Skin: Laparoscopic incisions are CDI with dermabond, no erythema or drainage   Assessment/Plan: This is a 87 y.o. male 17 days s/p robotic assisted laparoscopic lysis of adhesion for SBO    - Pain control prn with OTC medications  - Okay to switch to dulcolax; Miralax prn  - Reviewed wound care  - Reviewed lifting restrictions; 4 weeks total   - He can follow up prn   -- Edison Simon, PA-C  [facility] 09/24/2020, 10:51 AM [phone] M-F: 7am - 4pm

## 2020-09-24 NOTE — Patient Instructions (Addendum)
May alternate tylenol with Ibuprofen every 4-6 hours if needed for any discomfort.  May use ice or heat to the area as needed for comfort. What ever seems to work best for you.  Follow-up with our office as needed.  Please call and ask to speak with a nurse if you develop questions or concerns.  GENERAL POST-OPERATIVE PATIENT INSTRUCTIONS   WOUND CARE INSTRUCTIONS: Try to keep the wound dry and avoid ointments on the wound unless directed to do so.  If the wound becomes bright red and painful or starts to drain infected material that is not clear, please contact your physician immediately.  If the wound is mildly pink and has a thick firm ridge underneath it, this is normal, and is referred to as a healing ridge.  This will resolve over the next 4-6 weeks.  BATHING: You may shower if you have been informed of this by your surgeon. However, Please do not submerge in a tub, hot tub, or pool until incisions are completely sealed or have been told by your surgeon that you may do so.  DIET:  You may eat any foods that you can tolerate.  It is a good idea to eat a high fiber diet and take in plenty of fluids to prevent constipation.  If you do become constipated you may want to take a mild laxative or take ducolax tablets on a daily basis until your bowel habits are regular.  Constipation can be very uncomfortable, along with straining, after recent surgery.  ACTIVITY: You may want to hug a pillow when coughing and sneezing to add additional support to the surgical area, if you had abdominal or chest surgery, which will decrease pain during these times.  You are encouraged to walk and engage in light activity for the next two weeks.  You should not lift more than 20 pounds for 6 weeks after surgery as it could put you at increased risk for complications.  Twenty pounds is roughly equivalent to a plastic bag of groceries. At that time- Listen to your body when lifting, if you have pain when lifting, stop  and then try again in a few days. Soreness after doing exercises or activities of daily living is normal as you get back in to your normal routine.  MEDICATIONS:  Try to take narcotic medications and anti-inflammatory medications, such as tylenol, ibuprofen, naprosyn, etc., with food.  This will minimize stomach upset from the medication.  Should you develop nausea and vomiting from the pain medication, or develop a rash, please discontinue the medication and contact your physician.  You should not drive, make important decisions, or operate machinery when taking narcotic pain medication.  SUNBLOCK Use sun block to incision area over the next year if this area will be exposed to sun. This helps decrease scarring and will allow you avoid a permanent darkened area over your incision.  QUESTIONS:  Please feel free to call our office if you have any questions, and we will be glad to assist you.

## 2020-09-24 NOTE — Progress Notes (Signed)
   Follow-Up Visit   Subjective  Kyle Simon is a 87 y.o. male who presents for the following: Annual Exam (Here to follow up on face still has a spot on left temple area that's still there. Also has itchy spot in the middle of back. /Patient has surgery last week for bowel obstruction.per paper chart no history of bcc scc or atypia ).  General skin examination, one persistent spot left temple Location:  Duration:  Quality:  Associated Signs/Symptoms: Modifying Factors:  Severity:  Timing: Context:   Objective  Well appearing patient in no apparent distress; mood and affect are within normal limits. Mid Back Fill body skin check, patient had recent bowel surgery and has folow up with pcp today.  No atypical pigmented lesions.  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  Right Upper Back itch on right scapular area without rash or growth compatible with neurogenic pruritus    All skin waist up examined.   Assessment & Plan    Screening exam for skin cancer Mid Back  Annual skin examination, sooner if spot on temple grows or bleeds.  Notalgia paresthetica Right Upper Back  If bothersome he may look for an over-the-counter anti-itch lotion containing the ingredient pramoxine.  This may be used as needed if it provides some relief of the itching.      I, Lavonna Monarch, MD, have reviewed all documentation for this visit.  The documentation on 09/24/20 for the exam, diagnosis, procedures, and orders are all accurate and complete.

## 2020-09-30 ENCOUNTER — Ambulatory Visit: Payer: Medicare Other

## 2020-10-08 ENCOUNTER — Encounter: Payer: Self-pay | Admitting: Dermatology

## 2020-10-10 DIAGNOSIS — Z96653 Presence of artificial knee joint, bilateral: Secondary | ICD-10-CM | POA: Diagnosis not present

## 2020-10-10 DIAGNOSIS — M6588 Other synovitis and tenosynovitis, other site: Secondary | ICD-10-CM | POA: Diagnosis not present

## 2020-10-12 DIAGNOSIS — Z20822 Contact with and (suspected) exposure to covid-19: Secondary | ICD-10-CM | POA: Diagnosis not present

## 2020-10-13 ENCOUNTER — Encounter: Payer: Self-pay | Admitting: Endocrinology

## 2020-10-13 ENCOUNTER — Other Ambulatory Visit: Payer: Self-pay

## 2020-10-13 ENCOUNTER — Ambulatory Visit (INDEPENDENT_AMBULATORY_CARE_PROVIDER_SITE_OTHER): Payer: Medicare Other | Admitting: Endocrinology

## 2020-10-13 VITALS — BP 140/70 | HR 60 | Wt 186.0 lb

## 2020-10-13 DIAGNOSIS — E063 Autoimmune thyroiditis: Secondary | ICD-10-CM | POA: Diagnosis not present

## 2020-10-13 DIAGNOSIS — E871 Hypo-osmolality and hyponatremia: Secondary | ICD-10-CM

## 2020-10-13 LAB — BASIC METABOLIC PANEL
BUN: 23 mg/dL (ref 6–23)
CO2: 25 mEq/L (ref 19–32)
Calcium: 9.3 mg/dL (ref 8.4–10.5)
Chloride: 96 mEq/L (ref 96–112)
Creatinine, Ser: 0.9 mg/dL (ref 0.40–1.50)
GFR: 77.81 mL/min (ref >60.00)
Glucose, Bld: 100 mg/dL — ABNORMAL HIGH (ref 70–99)
Potassium: 4.8 mEq/L (ref 3.5–5.1)
Sodium: 129 mEq/L — ABNORMAL LOW (ref 135–145)

## 2020-10-13 LAB — T4, FREE: Free T4: 1.43 ng/dL (ref 0.60–1.60)

## 2020-10-13 LAB — TSH: TSH: 1.02 u[IU]/mL (ref 0.35–5.50)

## 2020-10-13 NOTE — Progress Notes (Signed)
Patient ID: Kyle Simon, male   DOB: [birth year], 87 y.o.   MRN: [id number]            Reason for Appointment: Endocrinology follow-up visit    History of Present Illness:   HYPONATREMIA:  Background history:  Serum sodium has been low since at least 2011 and has been as low as 125 in the past More recently lowest reading 127 He has not had any associated symptoms of decreased appetite, nausea or drowsiness Not on any thiazide diuretics or SSRI drugs Has had negative endocrine work-up for cortisol deficiencies  Has a history of drinking a lot of water partly for his constipation  Recent history: His urine osmolality is last high at 460 done in 10/21, was also high at 442 on initial evaluation Urine sodium was over 90 previously  He was told to cut back on his fluid intake especially coffee, usually drinking only 1 cup of coffee now  Even with a trial of salt tablets his sodium was down to 126 in 9/21 Also was having nausea from taking more than 1 regular tablet daily  Initially this was significantly better after starting DEMECLOCYCLINE 150 mg daily in 9/21 However this was stopped when his sodium decreased on the treatment down to 126  He feels his appetite is normal, he has no nausea or unusual fatigue He has lost weight because of recent surgical issues  Sodium level has been recently much improved during his hospitalization for bowel obstruction  Labs pending from today  CT scan of chest did not show any tumor  Lab Results  Component Value Date   NA 129 (L) 10/13/2020   K 4.8 10/13/2020   CL 96 10/13/2020   CO2 25 10/13/2020     Hypothyroidism was first diagnosed  around 2005 years ago   At the time of diagnosis patient was not having symptoms of  fatigue, cold sensitivity, difficulty concentrating, dry skin, weight gain Likely had only done routine testing and was told that he was hypothyroid and started him on unknown dose of levothyroxine  He did not  start feeling any different when he started taking levothyroxine initially Also usually does not feel any different with dosage adjustments that have been made Apparently his dose was 150 g for some time but this was reduced in 06/2015 when his TSH was low normal Since then he has been taking 150 g 5 days a week and 125 g twice a week Non-insulin initial consultation he was complaining that since spring time his energy level has been not as good and he has not been able to do as much with activities as he used to   He was referred here because his TSH is tending to be high but also his free T4 was relatively higher in 7/18  Recent history: On his initial consultation since his TSH was still relatively high and he was taking the equivalent of about 142 g of levothyroxine daily he was switched to taking 150 g once daily Also he was told to not take his levothyroxine at bedtime but take it before breakfast  He is taking his levothyroxine regularly before breakfast every morning Not taking any multivitamins in the morning at the same time  Since his TSH was low normal on his last visit he was told to take only half tablet weekly extra instead of 8 tablets a week However he forgot and he is still taking an extra tablet on Saturdays No complaints of fatigue  Labs pending   Patient's weight history is as follows:  Wt Readings from Last 3 Encounters:  10/13/20 186 lb (84.4 kg)  09/24/20 186 lb (84.4 kg)  09/16/20 185 lb (83.9 kg)    Thyroid function results have been as follows:  Lab Results  Component Value Date   TSH 1.02 10/13/2020   TSH 0.55 06/30/2020   TSH 3.51 05/02/2020   TSH 1.51 01/28/2020   FREET4 1.43 10/13/2020   FREET4 1.08 05/02/2020   FREET4 1.48 01/28/2020   FREET4 1.30 11/20/2019   T3FREE 3.2 12/28/2016   T3FREE 2.7 10/17/2015    Lab Results  Component Value Date   TSH 1.02 10/13/2020   TSH 0.55 06/30/2020   TSH 3.51 05/02/2020   TSH 1.51 01/28/2020    TSH 2.22 11/20/2019   TSH 6.58 (H) 09/12/2019   TSH 3.78 07/24/2019   TSH 3.350 02/16/2019   TSH 3.09 12/11/2018     Past Medical History:  Diagnosis Date   Arthritis    both hands;Dr.Deveshwar   Constipation due to pain medication    Diabetes mellitus without complication ([place])    Type II. Uses no medication. Pt controls with diet and weight   Enlarged prostate    Frequent urination at night    GERD (gastroesophageal reflux disease)    History of noncompliance with medical treatment, presenting hazards to health 11/20/2018   Hyperlipidemia    Hyperlipidemia associated with type 2 diabetes mellitus ([place]) 04/19/2007   Qualifier: Diagnosis of  By: Linna Darner MD, William     Hypertension    Hypothyroidism    Polio    as a child w/o complications   Snoring    no sleep apnea   Statin declined-  11/20/2018   patient understands risks associated with this decision and has been advised to restart by cardiologist as well as myself several times   Torn meniscus    Transfusion history 2012   post TKR    Past Surgical History:  Procedure Laterality Date   ANTERIOR LAT LUMBAR FUSION Right 11/03/2017   Procedure: Right Lumbar three-four Anterolateral lumbar interbody fusion with lateral plate;  Surgeon: Erline Levine, MD;  Location: [facility];  Service: Neurosurgery;  Laterality: Right;   BACK SURGERY  2011   hemiarthroplasty 01/11/1999 and 6   CARDIAC CATHETERIZATION  09/2001   negative   COLONOSCOPY     EYE SURGERY  2003   R&L cataract removed & IOL- 2003 & 2007,Dr.Jenkins   INGUINAL HERNIA REPAIR  2002   right, Dr. Truitt Leep   JOINT REPLACEMENT  2012   left knee replacement   LIPOMA EXCISION     back   MENISCECTOMY Bilateral    Dr. Noemi Chapel   PROSTATE SURGERY  07/2006   for urinary urgency, Dr. Everardo Pacific CUFF REPAIR Right October, 2016   sinus & throat surgery-1995  1995   TONSILLECTOMY     TOTAL KNEE ARTHROPLASTY  01/11/2011   Procedure: TOTAL KNEE ARTHROPLASTY;  Surgeon:  Rudean Haskell, MD;  Location: [facility];  Service: Orthopedics;  Laterality: Left;   TOTAL KNEE ARTHROPLASTY Right 11/22/2016   Procedure: TOTAL KNEE ARTHROPLASTY;  Surgeon: Vickey Huger, MD;  Location: [facility];  Service: Orthopedics;  Laterality: Right;   UVULECTOMY  4650   Dr. Erik Obey   [place]   fracture- right   XI ROBOTIC ASSISTED VENTRAL HERNIA N/A 09/07/2020   Procedure: XI ROBOTIC ASSISTED DIAGNOSTIC LAPAROSCOPY;  Surgeon: Dahlia Byes,  Marjory Lies, MD;  Location: [facility];  Service: General;  Laterality: N/A;    Family History  Problem Relation Age of Onset   Sudden death Father        accident   Coronary artery disease Mother    Emphysema Mother    Diverticulosis Mother    Thyroid disease Mother    Asthma Brother    Bone cancer Brother    Diabetes Paternal Grandmother    Sarcoidosis Son    Thyroid disease Daughter    Anesthesia problems Neg Hx    Hypotension Neg Hx    Malignant hyperthermia Neg Hx    Pseudochol deficiency Neg Hx    Colon cancer Neg Hx     Social History:  reports that he quit smoking about 54 years ago. His smoking use included cigarettes. He has a 18.00 pack-year smoking history. He has never used smokeless tobacco. He reports current alcohol use of about 4.0 standard drinks per week. He reports that he does not use drugs.  Allergies:  Allergies  Allergen Reactions   Simvastatin Other (See Comments)    MYALGIAS   Other     Other reaction(s): Unknown Other reaction(s): Unknown   Oxycodone Other (See Comments)    Severe constipation    Allergies as of 10/13/2020       Reactions   Simvastatin Other (See Comments)   MYALGIAS   Other    Other reaction(s): Unknown Other reaction(s): Unknown   Oxycodone Other (See Comments)   Severe constipation        Medication List        Accurate as of October 13, 2020 11:59 PM. If you have any questions, ask your nurse or doctor.          acetaminophen 500 MG tablet Commonly known  as: TYLENOL Take 1,000 mg by mouth every 6 (six) hours as needed for mild pain. Takes 2 tablets nightly   ARTIFICIAL TEARS OP Place 1 drop into both eyes 2 (two) times daily.   aspirin 81 MG tablet Take 81 mg by mouth every evening.   blood glucose meter kit and supplies Kit Inject 1 each into the skin as directed. Dispense based on patient and insurance preference. Use up to four times daily as directed. (FOR ICD-9 250.00, 250.01).   CALCIUM 600 + D PO Take 1 tablet by mouth daily.   celecoxib 200 MG capsule Commonly known as: CELEBREX Take 200 mg by mouth daily.   diclofenac Sodium 1 % Gel Commonly known as: VOLTAREN Apply 2 g topically at bedtime.   feeding supplement Liqd Take 237 mLs by mouth 2 (two) times daily between meals.   Fish Oil 1000 MG Caps Take 1,000 mg by mouth every evening.   glucose blood test strip Commonly known as: PRECISION XTRA TEST STRIPS USE TO CHECK BLOOD SUGAR ONCE DAILY DX E11.9   Lancets Ultra Thin 30G Misc Patient is to use to check blood glucose in the AM fasting and 2 hours after largest meal.   levothyroxine 150 MCG tablet Commonly known as: Synthroid Take 1 tablet (150 mcg total) by mouth daily before breakfast. Take 1.5 tablet (225 mcg) once a week   losartan 50 MG tablet Commonly known as: COZAAR Take 1 tablet (50 mg total) by mouth daily.   multivitamin with minerals Tabs tablet Take 1 tablet by mouth daily.   NONFORMULARY OR COMPOUNDED ITEM Glucometer (Precision XTRA- Model #[id number])   pantoprazole 40 MG tablet Commonly known as:  Protonix Take 1 tablet (40 mg total) by mouth daily for 14 days.   polyethylene glycol 17 g packet Commonly known as: MiraLax Take 17 g by mouth daily.   Restora Caps One tab qd   rosuvastatin 5 MG tablet Commonly known as: Crestor Take 1 tablet (5 mg total) by mouth at bedtime.   tamsulosin 0.4 MG Caps capsule Commonly known as: FLOMAX Take 0.4 mg by mouth 2 (two) times daily.    vitamin B-12 1000 MCG tablet Commonly known as: CYANOCOBALAMIN Take 1,000 mcg by mouth daily.         Review of Systems   Was prescribed losartan 50 mg for reportedly kidney protection by PCP This has been reduced to half tablet daily since 3/22 because of low normal blood pressure He has a blood pressure monitor at home Has been Cortisol level has been normal  BP Readings from Last 3 Encounters:  10/13/20 140/70  09/24/20 105/67  09/16/20 114/72     He has history of prediabetes with last A1c 6.8.  Also several years ago he did have blood sugars in the diabetic range  Glucose has been up to 132 fasting at home and he is not sure why He is asking for a new glucose monitor prescription as he lost the prescription that was given by PCP  Has not been treated with medications, A1c has been somewhat variable  Wt Readings from Last 3 Encounters:  10/13/20 186 lb (84.4 kg)  09/24/20 186 lb (84.4 kg)  09/16/20 185 lb (83.9 kg)   Lab Results  Component Value Date   HGBA1C 7.1 (H) 09/16/2020   HGBA1C 6.5 05/02/2020   HGBA1C 6.8 (H) 11/20/2019   Lab Results  Component Value Date   MICROALBUR 10 04/14/2017   LDLCALC 47 09/16/2020   CREATININE 0.90 10/13/2020           Examination:    BP 140/70   Pulse 60   Wt 186 lb (84.4 kg)   BMI 26.69 kg/m    Assessment:   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 and CT scan did not show tumor He is usually asymptomatic Although previously his sodium had improved to 130 with demeclocycline 150 mg daily subsequently sodium had dropped down again He  has been advised fluid restriction, especially with coffee which he has reduced  Recently sodium was much improved likely to be from hydration in the hospital and avoidance of oral fluids  Labs to be checked today   HYPOTHYROIDISM: He is supposed to be taking 8 tablets/week of the 150 mcg levothyroxine but he is again forgetting the extra  tablet weekly about half the time  Mild diabetes: Although A1c is slightly higher at 7.1 is still adequate for his age  PLAN:   Sodium and renal function to be checked  Since he is not symptomatic he is not a candidate for [place], may use this only if sodium is below 125  Also recheck thyroid levels since she is still taking an extra 150 mcg weekly instead of cutting down by half a tablet weekly on the last visit  Follow-up in 4 months if labs are in a good range  New One Touch Verio monitor was given today  Elayne Snare 10/14/2020, 8:18 AM     Note: This office note was prepared with Dragon voice recognition system technology. Any transcriptional errors that result from this process are unintentional.  Addendum: Sodium 129, since he is asymptomatic  we will continue to monitor  TSH normal, to switch to 175 mcg daily

## 2020-10-13 NOTE — Patient Instructions (Signed)
Avoid excess fluids

## 2020-10-14 MED ORDER — GLUCOSE BLOOD VI STRP: ORAL_STRIP | 12 refills | Status: DC

## 2020-10-15 ENCOUNTER — Other Ambulatory Visit: Payer: Self-pay

## 2020-10-15 ENCOUNTER — Encounter (INDEPENDENT_AMBULATORY_CARE_PROVIDER_SITE_OTHER): Payer: Medicare Other | Admitting: Ophthalmology

## 2020-10-15 DIAGNOSIS — H43813 Vitreous degeneration, bilateral: Secondary | ICD-10-CM | POA: Diagnosis not present

## 2020-10-15 DIAGNOSIS — E113393 Type 2 diabetes mellitus with moderate nonproliferative diabetic retinopathy without macular edema, bilateral: Secondary | ICD-10-CM

## 2020-10-15 MED ORDER — ONETOUCH VERIO VI STRP: ORAL_STRIP | 3 refills | Status: DC

## 2020-10-15 MED ORDER — LEVOTHYROXINE SODIUM 175 MCG PO TABS: 175.0000 ug | ORAL_TABLET | Freq: Every day | ORAL | 3 refills | Status: DC

## 2020-10-15 MED ORDER — ONETOUCH ULTRASOFT LANCETS MISC: 3 refills | Status: DC

## 2020-10-15 NOTE — Addendum Note (Signed)
Addended by: Cinda Quest on: 10/15/2020 04:50 PM   Modules accepted: Orders

## 2020-10-16 DIAGNOSIS — M65871 Other synovitis and tenosynovitis, right ankle and foot: Secondary | ICD-10-CM | POA: Diagnosis not present

## 2020-10-30 DIAGNOSIS — R3914 Feeling of incomplete bladder emptying: Secondary | ICD-10-CM | POA: Diagnosis not present

## 2020-10-30 DIAGNOSIS — R3912 Poor urinary stream: Secondary | ICD-10-CM | POA: Diagnosis not present

## 2020-10-30 DIAGNOSIS — M25572 Pain in left ankle and joints of left foot: Secondary | ICD-10-CM | POA: Diagnosis not present

## 2020-10-30 DIAGNOSIS — N401 Enlarged prostate with lower urinary tract symptoms: Secondary | ICD-10-CM | POA: Diagnosis not present

## 2020-10-30 DIAGNOSIS — M65872 Other synovitis and tenosynovitis, left ankle and foot: Secondary | ICD-10-CM | POA: Diagnosis not present

## 2020-10-30 DIAGNOSIS — M7732 Calcaneal spur, left foot: Secondary | ICD-10-CM | POA: Diagnosis not present

## 2020-10-31 ENCOUNTER — Telehealth: Payer: Self-pay | Admitting: Endocrinology

## 2020-10-31 NOTE — Telephone Encounter (Signed)
Patient called to advise that his insurance does not cover One Touch "without hearing from the doctor"  For questions or clarification please contact patient at Ph# [phone]

## 2020-10-31 NOTE — Telephone Encounter (Signed)
Can you check and see if a PA was received for One Touch.

## 2020-11-04 ENCOUNTER — Other Ambulatory Visit (HOSPITAL_COMMUNITY): Payer: Self-pay

## 2020-11-04 MED ORDER — FREESTYLE LITE W/DEVICE KIT: 1.0000 | PACK | Freq: Every day | 0 refills | Status: DC

## 2020-11-04 NOTE — Telephone Encounter (Signed)
Meter has been sent. 

## 2020-11-04 NOTE — Telephone Encounter (Signed)
Is it okay to send it a for the Free style Lite as this is what insurance covers

## 2020-11-06 ENCOUNTER — Telehealth: Payer: Self-pay

## 2020-11-07 NOTE — Telephone Encounter (Signed)
Supposed to get Onetouch strips. Has Onetouch monitor & lancets. thru [employer].

## 2020-11-14 DIAGNOSIS — M65871 Other synovitis and tenosynovitis, right ankle and foot: Secondary | ICD-10-CM | POA: Diagnosis not present

## 2020-11-14 DIAGNOSIS — M25571 Pain in right ankle and joints of right foot: Secondary | ICD-10-CM | POA: Diagnosis not present

## 2020-11-28 NOTE — Telephone Encounter (Signed)
Pt needs Onetouch test strips. Pt is out. Also, would like info on a cheaper BS montior. Through [employer]. Pt contact [phone]

## 2020-11-30 DIAGNOSIS — E785 Hyperlipidemia, unspecified: Secondary | ICD-10-CM | POA: Insufficient documentation

## 2020-11-30 NOTE — Progress Notes (Signed)
Cardiology Office Note   Date:  12/01/2020   ID:  Kyle, Simon Aug 02, 1932, MRN [id number]  PCP:  Lesleigh Noe, Simon  Cardiologist:   Minus Breeding, Simon   Chief Complaint  Patient presents with   Shortness of Breath       History of Present Illness: Kyle Simon is a 87 y.o. male who is referred by Lesleigh Noe, Simon for evaluation of SOB and abnormal EKG.   He had a low risk stress echo in 2012.  He had a distant cardiac cath.   He had dyspnea at a previous visit and I sent him for a [employer].  This was negative for ischemia.    Since I last saw him he was in the hospital with SBO.  He had no cardiovascular complications with this hospitalization and I did review those records for this visit.  He gets around slowly because of knee and ankle problems.  He was doing some water aerobics in the pool that he had access to during the summer.  He is limited in the colder weather. The patient denies any new symptoms such as chest discomfort, neck or arm discomfort. There has been no new shortness of breath, PND or orthopnea. There have been no reported palpitations, presyncope or syncope.    Past Medical History:  Diagnosis Date   Arthritis    both hands;Dr.Deveshwar   Constipation due to pain medication    Diabetes mellitus without complication ([place])    Type II. Uses no medication. Pt controls with diet and weight   Enlarged prostate    Frequent urination at night    GERD (gastroesophageal reflux disease)    History of noncompliance with medical treatment, presenting hazards to health 11/20/2018   Hyperlipidemia associated with type 2 diabetes mellitus ([place]) 04/19/2007   Qualifier: Diagnosis of  By: Kyle Simon, Kyle Simon     Hypertension    Hypothyroidism    Polio    as a child w/o complications   Snoring    no sleep apnea   Statin declined-  11/20/2018   patient understands risks associated with this decision and has been advised to restart by cardiologist as  well as myself several times   Torn meniscus    Transfusion history 2012   post TKR    Past Surgical History:  Procedure Laterality Date   ANTERIOR LAT LUMBAR FUSION Right 11/03/2017   Procedure: Right Lumbar three-four Anterolateral lumbar interbody fusion with lateral plate;  Surgeon: Erline Levine, Simon;  Location: [facility];  Service: Neurosurgery;  Laterality: Right;   BACK SURGERY  2011   hemiarthroplasty 01/11/1999 and 6   CARDIAC CATHETERIZATION  09/2001   negative   COLONOSCOPY     EYE SURGERY  2003   R&L cataract removed & IOL- 2003 & 2007,Dr.Jenkins   INGUINAL HERNIA REPAIR  2002   right, Dr. Truitt Leep   JOINT REPLACEMENT  2012   left knee replacement   LIPOMA EXCISION     back   MENISCECTOMY Bilateral    Dr. Noemi Chapel   PROSTATE SURGERY  07/2006   for urinary urgency, Dr. Everardo Pacific CUFF REPAIR Right October, 2016   sinus & throat surgery-1995  1995   TONSILLECTOMY     TOTAL KNEE ARTHROPLASTY  01/11/2011   Procedure: TOTAL KNEE ARTHROPLASTY;  Surgeon: Rudean Haskell, Simon;  Location: [facility];  Service: Orthopedics;  Laterality: Left;   TOTAL KNEE ARTHROPLASTY Right 11/22/2016  Procedure: TOTAL KNEE ARTHROPLASTY;  Surgeon: Vickey Huger, Simon;  Location: [facility];  Service: Orthopedics;  Laterality: Right;   UVULECTOMY  6945   Dr. Erik Obey   [place]   fracture- right   XI ROBOTIC ASSISTED VENTRAL HERNIA N/A 09/07/2020   Procedure: XI ROBOTIC ASSISTED DIAGNOSTIC LAPAROSCOPY;  Surgeon: Jules Husbands, Simon;  Location: [facility];  Service: General;  Laterality: N/A;     Current Outpatient Medications  Medication Sig Dispense Refill   acetaminophen (TYLENOL) 500 MG tablet Take 1,000 mg by mouth every 6 (six) hours as needed for mild pain. Takes 2 tablets nightly     aspirin 81 MG tablet Take 81 mg by mouth every evening.      blood glucose meter kit and supplies KIT Inject 1 each into the skin as directed. Dispense based on patient and insurance  preference. Use up to four times daily as directed. (FOR ICD-9 250.00, 250.01). 1 each 0   Blood Glucose Monitoring Suppl (FREESTYLE LITE) w/Device KIT 1 Device by Does not apply route daily. 1 kit 0   Calcium Carb-Cholecalciferol (CALCIUM 600 + D PO) Take 1 tablet by mouth daily.     celecoxib (CELEBREX) 200 MG capsule Take 200 mg by mouth daily.     diclofenac Sodium (VOLTAREN) 1 % GEL Apply 2 g topically at bedtime.     glucose blood (ONETOUCH VERIO) test strip Use to check blood sugar daily 100 each 3   glucose blood test strip Use as instructed to check once daily at various times 50 each 12   Hypromellose (ARTIFICIAL TEARS OP) Place 1 drop into both eyes 2 (two) times daily.     Lancets (ONETOUCH ULTRASOFT) lancets Use to check blood sugar daily 100 each 3   levothyroxine (SYNTHROID) 175 MCG tablet Take 1 tablet (175 mcg total) by mouth daily. 90 tablet 3   losartan (COZAAR) 50 MG tablet Take 1 tablet (50 mg total) by mouth daily. 90 tablet 3   Multiple Vitamin (MULTIVITAMIN WITH MINERALS) TABS Take 1 tablet by mouth daily.     NONFORMULARY OR COMPOUNDED ITEM Glucometer (Precision XTRA- Model #XCC [id number]) 1 each 0   Omega-3 Fatty Acids (FISH OIL) 1000 MG CAPS Take 1,000 mg by mouth every evening.     polyethylene glycol (MIRALAX) 17 g packet Take 17 g by mouth daily. 14 each 0   Probiotic Product (RESTORA) CAPS One tab qd 90 capsule 3   rosuvastatin (CRESTOR) 5 MG tablet Take 1 tablet (5 mg total) by mouth at bedtime. 90 tablet 3   tamsulosin (FLOMAX) 0.4 MG CAPS capsule Take 0.4 mg by mouth 2 (two) times daily.     vitamin B-12 (CYANOCOBALAMIN) 1000 MCG tablet Take 1,000 mcg by mouth daily.     pantoprazole (PROTONIX) 40 MG tablet Take 1 tablet (40 mg total) by mouth daily for 14 days. 14 tablet 0   No current facility-administered medications for this visit.    Allergies:   Simvastatin, Other, and Oxycodone    ROS:  Please see the history of present illness.   Otherwise, review  of systems are positive for constipation, joint pains.   All other systems are reviewed and negative.    PHYSICAL EXAM: VS:  BP 130/70   Pulse 62   Ht 5' 10"  (1.778 m)   Wt 188 lb (85.3 kg)   SpO2 92%   BMI 26.98 kg/m  , BMI Body mass index is 26.98  kg/m. GENERAL:  Well appearing NECK:  No jugular venous distention, waveform within normal limits, carotid upstroke brisk and symmetric, no bruits, no thyromegaly LUNGS:  Clear to auscultation bilaterally CHEST:  Unremarkable HEART:  PMI not displaced or sustained,S1 and S2 within normal limits, no S3, no S4, no clicks, no rubs, no murmurs ABD:  Flat, positive bowel sounds normal in frequency in pitch, no bruits, no rebound, no guarding, no midline pulsatile mass, no hepatomegaly, no splenomegaly EXT:  2 plus pulses throughout, no edema, no cyanosis no clubbing  EKG:  EKG is not ordered today. The ekg ordered 09/06/2020 demonstrates sinus rhythm, rate 70, axis within normal limits, intervals within normal limits, premature ventricular contractions.   Recent Labs: 09/06/2020: ALT 18 09/07/2020: Magnesium 2.2 09/16/2020: Hemoglobin 12.2; Platelets 268.0 10/13/2020: BUN 23; Creatinine, Ser 0.90; Potassium 4.8; Sodium 129; TSH 1.02    Lipid Panel    Component Value Date/Time   CHOL 118 09/16/2020 1518   CHOL 119 02/16/2019 0914   TRIG 114.0 09/16/2020 1518   HDL 48.60 09/16/2020 1518   HDL 49 02/16/2019 0914   CHOLHDL 2 09/16/2020 1518   VLDL 22.8 09/16/2020 1518   LDLCALC 47 09/16/2020 1518   LDLCALC 49 02/16/2019 0914   LDLDIRECT 121 (H) 11/15/2018 1010   LDLDIRECT 61.6 04/25/2013 1431      Wt Readings from Last 3 Encounters:  12/01/20 188 lb (85.3 kg)  10/13/20 186 lb (84.4 kg)  09/24/20 186 lb (84.4 kg)      Other studies Reviewed: Additional studies/ records that were reviewed today include: Hospital records Review of the above records demonstrates: See elsewhere   ASSESSMENT AND PLAN:  SOB:   This is baseline and  not worse since his stress perfusion study in 2019.  No further work-up.  I have suggested finding a [employer] indoor pool for exercise.  HTN:  The blood pressure is controlled.  No change in therapy.   DM: A1c was 7.1 which is up slightly and he is having this followed now that he is out of the hospital and may be back to baseline activities and diet.  CORONARY CALCIUM:    Again he had a negative perfusion study in 2019.  We are continuing with risk reduction.   AORTIC ATHEROSCLEROSIS:    As above.   DYSLIPIDEMIA:    His LDL was 47 with an HDL of 48.  No change in therapy.   Current medicines are reviewed at length with the patient today.  The patient does not have concerns regarding medicines.  The following changes have been made: None  Labs/ tests ordered today include:   No orders of the defined types were placed in this encounter.    Disposition:   FU with me 12 months   Signed, Minus Breeding, Simon  12/01/2020 11:37 AM    [facility]

## 2020-12-01 ENCOUNTER — Ambulatory Visit (INDEPENDENT_AMBULATORY_CARE_PROVIDER_SITE_OTHER): Payer: Medicare Other | Admitting: Cardiology

## 2020-12-01 ENCOUNTER — Encounter: Payer: Self-pay | Admitting: Cardiology

## 2020-12-01 ENCOUNTER — Other Ambulatory Visit: Payer: Self-pay

## 2020-12-01 ENCOUNTER — Other Ambulatory Visit: Payer: Self-pay | Admitting: Family Medicine

## 2020-12-01 VITALS — BP 130/70 | HR 62 | Ht 70.0 in | Wt 188.0 lb

## 2020-12-01 DIAGNOSIS — R0602 Shortness of breath: Secondary | ICD-10-CM | POA: Diagnosis not present

## 2020-12-01 DIAGNOSIS — R931 Abnormal findings on diagnostic imaging of heart and coronary circulation: Secondary | ICD-10-CM | POA: Diagnosis not present

## 2020-12-01 DIAGNOSIS — I700 Atherosclerosis of aorta: Secondary | ICD-10-CM

## 2020-12-01 DIAGNOSIS — E785 Hyperlipidemia, unspecified: Secondary | ICD-10-CM | POA: Diagnosis not present

## 2020-12-01 DIAGNOSIS — I10 Essential (primary) hypertension: Secondary | ICD-10-CM

## 2020-12-01 DIAGNOSIS — I7121 Aneurysm of the ascending aorta, without rupture: Secondary | ICD-10-CM

## 2020-12-01 NOTE — Progress Notes (Signed)
Ordering f/u CT

## 2020-12-01 NOTE — Telephone Encounter (Signed)
Called patient back. Wants to hold off on sending the Onetouch test strips. He will call to see if accu-chek is covered and give [place] a call back.

## 2020-12-01 NOTE — Patient Instructions (Signed)
Medication Instructions:  Your Physician recommend you continue on your current medication as directed.    *If you need a refill on your cardiac medications before your next appointment, please call your pharmacy*   Lab Work: None ordered today   Testing/Procedures: None ordered today   Follow-Up: At [facility], you and your health needs are our priority.  As part of our continuing mission to provide you with exceptional heart care, we have created designated Provider Care Teams.  These Care Teams include your primary Cardiologist (physician) and Advanced Practice Providers (APPs -  Physician Assistants and Nurse Practitioners) who all work together to provide you with the care you need, when you need it.  We recommend signing up for the patient portal called "MyChart".  Sign up information is provided on this After Visit Summary.  MyChart is used to connect with patients for Virtual Visits (Telemedicine).  Patients are able to view lab/test results, encounter notes, upcoming appointments, etc.  Non-urgent messages can be sent to your provider as well.   To learn more about what you can do with MyChart, go to [email or web address].    Your next appointment:   1 year(s)  The format for your next appointment:   In Person  Provider:   James Hochrein, MD {        

## 2020-12-02 ENCOUNTER — Telehealth: Payer: Self-pay | Admitting: Family Medicine

## 2020-12-02 NOTE — Telephone Encounter (Signed)
Pt called regarding a CT order and was wondering why this was ordered.Please advise.

## 2020-12-02 NOTE — Telephone Encounter (Signed)
Sorry for not reaching out sooner.   Please advise: He needs a repeat CT scan to follow-up from last years abnormal aorta size

## 2020-12-03 NOTE — Telephone Encounter (Signed)
Spoke to pt and explained what the CT order was for. Pt stated understanding.

## 2020-12-08 DIAGNOSIS — M65872 Other synovitis and tenosynovitis, left ankle and foot: Secondary | ICD-10-CM | POA: Diagnosis not present

## 2020-12-08 DIAGNOSIS — M25572 Pain in left ankle and joints of left foot: Secondary | ICD-10-CM | POA: Diagnosis not present

## 2020-12-17 ENCOUNTER — Other Ambulatory Visit: Payer: Self-pay

## 2020-12-17 ENCOUNTER — Ambulatory Visit (INDEPENDENT_AMBULATORY_CARE_PROVIDER_SITE_OTHER): Payer: Medicare Other | Admitting: Family Medicine

## 2020-12-17 ENCOUNTER — Encounter: Payer: Self-pay | Admitting: Family Medicine

## 2020-12-17 VITALS — BP 118/70 | HR 59 | Temp 96.2°F | Ht 70.0 in | Wt 190.2 lb

## 2020-12-17 DIAGNOSIS — M25572 Pain in left ankle and joints of left foot: Secondary | ICD-10-CM

## 2020-12-17 DIAGNOSIS — E1159 Type 2 diabetes mellitus with other circulatory complications: Secondary | ICD-10-CM

## 2020-12-17 DIAGNOSIS — E1169 Type 2 diabetes mellitus with other specified complication: Secondary | ICD-10-CM | POA: Diagnosis not present

## 2020-12-17 DIAGNOSIS — G8929 Other chronic pain: Secondary | ICD-10-CM | POA: Diagnosis not present

## 2020-12-17 DIAGNOSIS — I152 Hypertension secondary to endocrine disorders: Secondary | ICD-10-CM | POA: Diagnosis not present

## 2020-12-17 DIAGNOSIS — R2689 Other abnormalities of gait and mobility: Secondary | ICD-10-CM | POA: Diagnosis not present

## 2020-12-17 LAB — POCT GLYCOSYLATED HEMOGLOBIN (HGB A1C): Hemoglobin A1C: 6 % — AB (ref 4.0–5.6)

## 2020-12-17 NOTE — Patient Instructions (Addendum)
#  Diabetes - continue to work on diet   #Balance issues - referral to [place] therapy

## 2020-12-17 NOTE — Assessment & Plan Note (Signed)
Controlled. Cont losartan 50 mg

## 2020-12-17 NOTE — Assessment & Plan Note (Signed)
Chronic swelling at the ankle. Likely 2/2 to arthritis. He would like a second opinion from a different podiatrist - referral placed. Advised trial of compression socks.

## 2020-12-17 NOTE — Assessment & Plan Note (Signed)
C/b HTN, HLD.  Lab Results  Component Value Date   HGBA1C 6.0 (A) 12/17/2020  Continues to be diet controlled. Doing well. Cont losartan and crestor

## 2020-12-17 NOTE — Progress Notes (Signed)
Subjective:     Kyle Simon is a 87 y.o. male presenting for Knee Pain and Edema (L ankle )     Knee Pain    #Diabetes Currently taking - diet controlled  Using medications without difficulties: Yes Hypoglycemic episodes:No  Hyperglycemic episodes:No  Feet problems:No  Blood Sugars averaging: 100  Last HgbA1c:  Lab Results  Component Value Date   HGBA1C 6.0 (A) 12/17/2020    Diabetes Health Maintenance Due:    Diabetes Health Maintenance Due  Topic Date Due   FOOT EXAM  06/03/2020   OPHTHALMOLOGY EXAM  03/07/2021   HEMOGLOBIN A1C  06/17/2021   #Knee pain - left knee - no injury - has been getting worse with time - worse over the last few months - seeing Dr. Verneita Griffes w/o ortho - getting the knee drained - s/p knee replacement on both side - PA with ortho thought maybe the right knee  - stumbling around due to the pain and swelling which is getting worse instead of better - off and on with PT  #Left ankle - swelling - no injury - the right ankle swelling improves overnight - saw podiatry - got 2 separate shots in the ankle - swelling is not betting better - saw Dr. Pearson Grippe     Review of Systems   Social History   Tobacco Use  Smoking Status Former   Packs/day: 1.00   Years: 18.00   Pack years: 18.00   Types: Cigarettes   Quit date: 03/01/1966   Years since quitting: 54.8  Smokeless Tobacco Never        Objective:    BP Readings from Last 3 Encounters:  12/17/20 118/70  12/01/20 130/70  10/13/20 140/70   Wt Readings from Last 3 Encounters:  12/17/20 190 lb 4 oz (86.3 kg)  12/01/20 188 lb (85.3 kg)  10/13/20 186 lb (84.4 kg)    BP 118/70   Pulse (!) 59   Temp (!) 96.2 F (35.7 C) (Temporal)   Ht 5\' 10"  (1.778 m)   Wt 190 lb 4 oz (86.3 kg)   SpO2 99%   BMI 27.30 kg/m    Physical Exam Constitutional:      Appearance: Normal appearance. He is not ill-appearing or diaphoretic.  HENT:     Right Ear: External ear normal.      Left Ear: External ear normal.  Eyes:     General: No scleral icterus.    Extraocular Movements: Extraocular movements intact.     Conjunctiva/sclera: Conjunctivae normal.  Cardiovascular:     Rate and Rhythm: Normal rate and regular rhythm.     Heart sounds: Murmur heard.  Pulmonary:     Effort: Pulmonary effort is normal. No respiratory distress.     Breath sounds: Normal breath sounds. No wheezing.  Musculoskeletal:     Cervical back: Neck supple.     Comments: Left ankle Inspection: swelling posterior to the lateral malleolus Palpation: TTP along the lateral ankle ROM: normal Strength: normal   Skin:    General: Skin is warm and dry.  Neurological:     Mental Status: He is alert. Mental status is at baseline.  Psychiatric:        Mood and Affect: Mood normal.          Assessment & Plan:   Problem List Items Addressed This Visit       Cardiovascular and Mediastinum   Hypertension associated with diabetes ([place]) (Chronic)    Controlled. Cont losartan  50 mg        Endocrine   Type 2 diabetes mellitus with other specified complication (HCC) - Primary    C/b HTN, HLD.  Lab Results  Component Value Date   HGBA1C 6.0 (A) 12/17/2020  Continues to be diet controlled. Doing well. Cont losartan and crestor       Relevant Orders   POCT glycosylated hemoglobin (Hb A1C) (Completed)     Other   Balance problems    Likely 2/2 to worsening arthritis. He is working with ortho and podiatry for ongoing knee/ankle pain. Advised PT to help with balance and decrease fall risk. Referral placed      Relevant Orders   Ambulatory referral to Physical Therapy   Chronic pain of left ankle    Chronic swelling at the ankle. Likely 2/2 to arthritis. He would like a second opinion from a different podiatrist - referral placed. Advised trial of compression socks.       Relevant Orders   Ambulatory referral to Podiatry     Return in about 6 months (around 06/17/2021).  Lesleigh Noe, MD  This visit occurred during the SARS-CoV-2 public health emergency.  Safety protocols were in place, including screening questions prior to the visit, additional usage of staff PPE, and extensive cleaning of exam room while observing appropriate contact time as indicated for disinfecting solutions.

## 2020-12-17 NOTE — Assessment & Plan Note (Signed)
Likely 2/2 to worsening arthritis. He is working with ortho and podiatry for ongoing knee/ankle pain. Advised PT to help with balance and decrease fall risk. Referral placed

## 2020-12-18 ENCOUNTER — Encounter: Payer: Self-pay | Admitting: *Deleted

## 2020-12-18 ENCOUNTER — Ambulatory Visit
Admission: RE | Admit: 2020-12-18 | Discharge: 2020-12-18 | Disposition: A | Discharge status: Home / Self Care | Payer: Medicare Other | Source: Ambulatory Visit | Attending: Family Medicine | Admitting: Family Medicine

## 2020-12-18 DIAGNOSIS — I712 Thoracic aortic aneurysm, without rupture, unspecified: Secondary | ICD-10-CM | POA: Diagnosis not present

## 2020-12-18 DIAGNOSIS — L03115 Cellulitis of right lower limb: Secondary | ICD-10-CM | POA: Diagnosis not present

## 2020-12-18 DIAGNOSIS — M6588 Other synovitis and tenosynovitis, other site: Secondary | ICD-10-CM | POA: Diagnosis not present

## 2020-12-18 DIAGNOSIS — Z96653 Presence of artificial knee joint, bilateral: Secondary | ICD-10-CM | POA: Diagnosis not present

## 2020-12-18 DIAGNOSIS — I77810 Thoracic aortic ectasia: Secondary | ICD-10-CM | POA: Diagnosis not present

## 2020-12-18 DIAGNOSIS — I7121 Aneurysm of the ascending aorta, without rupture: Secondary | ICD-10-CM

## 2020-12-18 DIAGNOSIS — I700 Atherosclerosis of aorta: Secondary | ICD-10-CM | POA: Diagnosis not present

## 2020-12-18 DIAGNOSIS — L03116 Cellulitis of left lower limb: Secondary | ICD-10-CM | POA: Diagnosis not present

## 2020-12-18 MED ORDER — IOPAMIDOL (ISOVUE-370) INJECTION 76%
75.0000 mL | Freq: Once | INTRAVENOUS | Status: AC | PRN
  Administered 2020-12-18: 75 mL via INTRAVENOUS

## 2020-12-24 DIAGNOSIS — M65871 Other synovitis and tenosynovitis, right ankle and foot: Secondary | ICD-10-CM | POA: Diagnosis not present

## 2020-12-24 DIAGNOSIS — M25571 Pain in right ankle and joints of right foot: Secondary | ICD-10-CM | POA: Diagnosis not present

## 2020-12-31 DIAGNOSIS — R2689 Other abnormalities of gait and mobility: Secondary | ICD-10-CM | POA: Diagnosis not present

## 2020-12-31 DIAGNOSIS — R2681 Unsteadiness on feet: Secondary | ICD-10-CM | POA: Diagnosis not present

## 2021-01-02 DIAGNOSIS — R2689 Other abnormalities of gait and mobility: Secondary | ICD-10-CM | POA: Diagnosis not present

## 2021-01-02 DIAGNOSIS — R2681 Unsteadiness on feet: Secondary | ICD-10-CM | POA: Diagnosis not present

## 2021-01-06 DIAGNOSIS — R2689 Other abnormalities of gait and mobility: Secondary | ICD-10-CM | POA: Diagnosis not present

## 2021-01-06 DIAGNOSIS — R2681 Unsteadiness on feet: Secondary | ICD-10-CM | POA: Diagnosis not present

## 2021-01-07 ENCOUNTER — Ambulatory Visit: Payer: Medicare Other

## 2021-01-07 ENCOUNTER — Ambulatory Visit (INDEPENDENT_AMBULATORY_CARE_PROVIDER_SITE_OTHER): Payer: Medicare Other | Admitting: Podiatry

## 2021-01-07 ENCOUNTER — Other Ambulatory Visit: Payer: Self-pay

## 2021-01-07 DIAGNOSIS — M25572 Pain in left ankle and joints of left foot: Secondary | ICD-10-CM

## 2021-01-07 DIAGNOSIS — B351 Tinea unguium: Secondary | ICD-10-CM | POA: Diagnosis not present

## 2021-01-07 DIAGNOSIS — M25472 Effusion, left ankle: Secondary | ICD-10-CM | POA: Diagnosis not present

## 2021-01-07 DIAGNOSIS — M7750 Other enthesopathy of unspecified foot: Secondary | ICD-10-CM

## 2021-01-07 DIAGNOSIS — M7752 Other enthesopathy of left foot: Secondary | ICD-10-CM

## 2021-01-07 DIAGNOSIS — M79674 Pain in right toe(s): Secondary | ICD-10-CM | POA: Diagnosis not present

## 2021-01-07 DIAGNOSIS — N183 Chronic kidney disease, stage 3 unspecified: Secondary | ICD-10-CM

## 2021-01-07 DIAGNOSIS — M79675 Pain in left toe(s): Secondary | ICD-10-CM

## 2021-01-08 DIAGNOSIS — R2689 Other abnormalities of gait and mobility: Secondary | ICD-10-CM | POA: Diagnosis not present

## 2021-01-08 DIAGNOSIS — R2681 Unsteadiness on feet: Secondary | ICD-10-CM | POA: Diagnosis not present

## 2021-01-09 DIAGNOSIS — M25472 Effusion, left ankle: Secondary | ICD-10-CM | POA: Diagnosis not present

## 2021-01-10 LAB — URIC ACID: Uric Acid: 3 mg/dL — ABNORMAL LOW (ref 3.8–8.4)

## 2021-01-11 NOTE — Progress Notes (Signed)
  Subjective:  Patient ID: Kyle Simon, male    DOB: 08/12/1932,  MRN: [id number]  Chief Complaint  Patient presents with   Foot Swelling     New pt- Left ankle swollen for a long time saw a different foot doctor before not getting better    87 y.o. male presents with the above complaint. History confirmed with patient.  He has diabetes controlled with diet.  His A1c is 6.0%.  Left ankle has been swollen but not painful.  Has thickened elongated toenails he is unable to cut.  He previously was 1 to from the foot care center but it is too far to drive to [place] at this point.  No history of gout as far as he knows  Objective:  Physical Exam: warm, good capillary refill, no trophic changes or ulcerative lesions, normal DP and PT pulses, normal monofilament exam, and normal sensory exam.  Pes planus deformity Left Foot: dystrophic yellowed discolored nail plates with subungual debris and left ankle swelling, mild pain in the ankle and sinus tarsi Right Foot: dystrophic yellowed discolored nail plates with subungual debris   Assessment:   1. Capsulitis of left ankle   2. Swelling of left ankle joint   3. Sinus tarsitis, left      Plan:  Patient was evaluated and treated and all questions answered.  Patient educated on diabetes. Discussed proper diabetic foot care and discussed risks and complications of disease. Educated patient in depth on reasons to return to the office immediately should he/she discover anything concerning or new on the feet. All questions answered. Discussed proper shoes as well.   Discussed the etiology and treatment options for the condition in detail with the patient. Educated patient on the topical and oral treatment options for mycotic nails. Recommended debridement of the nails today. Sharp and mechanical debridement performed of all painful and mycotic nails today. Nails debrided in length and thickness using a nail nipper to level of comfort. Discussed  treatment options including appropriate shoe gear. Follow up as needed for painful nails.  he has pain and swelling in the left anterior ankle and sinus tarsi, suspect secondary to his pes planus deformity and secondary arthritis.  Discussed treatment of this including shoe gear bracing and injections.  Recommend injections of corticosteroid into the left ankle and sinus tarsi.  Following sterile prep with Betadine 2 mg of dexamethasone and 5 mg of Kenalog was injected into each joint separately.  He tolerated this well.  He will return as needed for this  Return in about 3 months (around 04/09/2021) for at risk diabetic foot care, f/u injections L foot/ankle.

## 2021-01-12 DIAGNOSIS — R2681 Unsteadiness on feet: Secondary | ICD-10-CM | POA: Diagnosis not present

## 2021-01-12 DIAGNOSIS — R2689 Other abnormalities of gait and mobility: Secondary | ICD-10-CM | POA: Diagnosis not present

## 2021-01-15 DIAGNOSIS — R2681 Unsteadiness on feet: Secondary | ICD-10-CM | POA: Diagnosis not present

## 2021-01-15 DIAGNOSIS — R2689 Other abnormalities of gait and mobility: Secondary | ICD-10-CM | POA: Diagnosis not present

## 2021-01-19 DIAGNOSIS — R2689 Other abnormalities of gait and mobility: Secondary | ICD-10-CM | POA: Diagnosis not present

## 2021-01-19 DIAGNOSIS — R2681 Unsteadiness on feet: Secondary | ICD-10-CM | POA: Diagnosis not present

## 2021-01-26 DIAGNOSIS — R2681 Unsteadiness on feet: Secondary | ICD-10-CM | POA: Diagnosis not present

## 2021-01-26 DIAGNOSIS — R2689 Other abnormalities of gait and mobility: Secondary | ICD-10-CM | POA: Diagnosis not present

## 2021-01-28 DIAGNOSIS — R2681 Unsteadiness on feet: Secondary | ICD-10-CM | POA: Diagnosis not present

## 2021-01-28 DIAGNOSIS — R2689 Other abnormalities of gait and mobility: Secondary | ICD-10-CM | POA: Diagnosis not present

## 2021-02-03 DIAGNOSIS — M25461 Effusion, right knee: Secondary | ICD-10-CM | POA: Diagnosis not present

## 2021-02-03 DIAGNOSIS — Z96653 Presence of artificial knee joint, bilateral: Secondary | ICD-10-CM | POA: Diagnosis not present

## 2021-02-09 DIAGNOSIS — Z20822 Contact with and (suspected) exposure to covid-19: Secondary | ICD-10-CM | POA: Diagnosis not present

## 2021-02-16 ENCOUNTER — Encounter: Payer: Self-pay | Admitting: Endocrinology

## 2021-02-16 ENCOUNTER — Other Ambulatory Visit: Payer: Self-pay

## 2021-02-16 ENCOUNTER — Ambulatory Visit (INDEPENDENT_AMBULATORY_CARE_PROVIDER_SITE_OTHER): Payer: Medicare Other | Admitting: Endocrinology

## 2021-02-16 VITALS — BP 140/78 | HR 124 | Ht 70.0 in | Wt 190.2 lb

## 2021-02-16 DIAGNOSIS — M25461 Effusion, right knee: Secondary | ICD-10-CM | POA: Diagnosis not present

## 2021-02-16 DIAGNOSIS — M659 Synovitis and tenosynovitis, unspecified: Secondary | ICD-10-CM | POA: Diagnosis not present

## 2021-02-16 DIAGNOSIS — E871 Hypo-osmolality and hyponatremia: Secondary | ICD-10-CM

## 2021-02-16 DIAGNOSIS — R931 Abnormal findings on diagnostic imaging of heart and coronary circulation: Secondary | ICD-10-CM

## 2021-02-16 DIAGNOSIS — E119 Type 2 diabetes mellitus without complications: Secondary | ICD-10-CM

## 2021-02-16 DIAGNOSIS — E063 Autoimmune thyroiditis: Secondary | ICD-10-CM

## 2021-02-16 DIAGNOSIS — M25561 Pain in right knee: Secondary | ICD-10-CM | POA: Diagnosis not present

## 2021-02-16 LAB — BASIC METABOLIC PANEL
BUN: 16 mg/dL (ref 6–23)
CO2: 25 mEq/L (ref 19–32)
Calcium: 9.3 mg/dL (ref 8.4–10.5)
Chloride: 97 mEq/L (ref 96–112)
Creatinine, Ser: 0.87 mg/dL (ref 0.40–1.50)
GFR: 78.42 mL/min (ref >60.00)
Glucose, Bld: 124 mg/dL — ABNORMAL HIGH (ref 70–99)
Potassium: 5.1 mEq/L (ref 3.5–5.1)
Sodium: 131 mEq/L — ABNORMAL LOW (ref 135–145)

## 2021-02-16 LAB — TSH: TSH: 0.05 u[IU]/mL — ABNORMAL LOW (ref 0.35–5.50)

## 2021-02-16 NOTE — Progress Notes (Addendum)
Patient ID: Kyle Simon, male   DOB: 06/14/1932, 87 y.o.   MRN: [id number]            Reason for Appointment: Endocrinology follow-up visit    History of Present Illness:   HYPONATREMIA:  Background history:  Serum sodium has been low since at least 2011 and has been as low as 125 in the past More recently lowest reading 127 He has not had any associated symptoms of decreased appetite, nausea or drowsiness Not on any thiazide diuretics or SSRI drugs Has had negative endocrine work-up for cortisol deficiencies  Has a history of drinking a lot of water partly for his constipation  Recent history: His urine osmolality was last high at 460 done in 10/21, was also high at 442 on initial evaluation Urine sodium was over 90 previously  He was previously told to cut back on his fluid intake especially coffee He is usually drinking only 1 cup of coffee now and no other excess fluids  With a trial of salt tablets his sodium did not improve, it was still down to 126 in 9/21 Also was having nausea from taking more than 1 regular tablet daily  Initially sodium was significantly better after starting DEMECLOCYCLINE 150 mg daily in 9/21 However this was stopped when his sodium decreased on the treatment down to 126  He feels fairly good without any malaise, decreased appetite or nausea   Labs pending from today  CT scan of chest did not show any tumor  Lab Results  Component Value Date   NA 129 (L) 10/13/2020   K 4.8 10/13/2020   CL 96 10/13/2020   CO2 25 10/13/2020     Hypothyroidism was first diagnosed  around 2005 years ago   At the time of diagnosis patient was not having symptoms of  fatigue, cold sensitivity, difficulty concentrating, dry skin, weight gain Likely had only done routine testing and was told that he was hypothyroid and started him on unknown dose of levothyroxine  He did not start feeling any different when he started taking levothyroxine  initially Also usually does not feel any different with dosage adjustments that have been made Apparently his dose was 150 g for some time but this was reduced in 06/2015 when his TSH was low normal Since then he has been taking 150 g 5 days a week and 125 g twice a week on-insulin initial consultation he was complaining that since spring time his energy level has been not as good and he has not been able to do as much with activities as he used to   He was referred here because his TSH is tending to be high but also his free T4 was relatively higher in 7/18  Recent history: On his initial consultation since his TSH was still relatively high and he was taking the equivalent of about 142 g of levothyroxine daily he was switched to taking 150 g once daily Also he was told to not take his levothyroxine at bedtime but take it before breakfast  He is taking his levothyroxine regularly before breakfast every morning More recently has been on 175 mcg levothyroxine daily, he does better with taking a daily regimen of the same dose Not taking any multivitamins in the morning at the same time  No recent complaints of fatigue  Labs pending   Patient's weight history is as follows:  Wt Readings from Last 3 Encounters:  02/16/21 190 lb 3.2 oz (86.3 kg)  12/17/20 190  lb 4 oz (86.3 kg)  12/01/20 188 lb (85.3 kg)    Thyroid function results have been as follows:  Lab Results  Component Value Date   TSH 1.02 10/13/2020   TSH 0.55 06/30/2020   TSH 3.51 05/02/2020   TSH 1.51 01/28/2020   FREET4 1.43 10/13/2020   FREET4 1.08 05/02/2020   FREET4 1.48 01/28/2020   FREET4 1.30 11/20/2019   T3FREE 3.2 12/28/2016   T3FREE 2.7 10/17/2015    Lab Results  Component Value Date   TSH 1.02 10/13/2020   TSH 0.55 06/30/2020   TSH 3.51 05/02/2020   TSH 1.51 01/28/2020   TSH 2.22 11/20/2019   TSH 6.58 (H) 09/12/2019   TSH 3.78 07/24/2019   TSH 3.350 02/16/2019   TSH 3.09 12/11/2018      Past Medical History:  Diagnosis Date   Arthritis    both hands;Dr.Deveshwar   Constipation due to pain medication    Diabetes mellitus without complication (HCC)    Type II. Uses no medication. Pt controls with diet and weight   Enlarged prostate    Frequent urination at night    GERD (gastroesophageal reflux disease)    History of noncompliance with medical treatment, presenting hazards to health 11/20/2018   Hyperlipidemia associated with type 2 diabetes mellitus (HCC) 04/19/2007   Qualifier: Diagnosis of  By: Alwyn Ren MD, William     Hypertension    Hypothyroidism    Polio    as a child w/o complications   Snoring    no sleep apnea   Statin declined-  11/20/2018   patient understands risks associated with this decision and has been advised to restart by cardiologist as well as myself several times   Torn meniscus    Transfusion history 2012   post TKR    Past Surgical History:  Procedure Laterality Date   ANTERIOR LAT LUMBAR FUSION Right 11/03/2017   Procedure: Right Lumbar three-four Anterolateral lumbar interbody fusion with lateral plate;  Surgeon: Maeola Harman, MD;  Location[facility] OR;  Service: Neurosurgery;  Laterality: Right;   BACK SURGERY  2011   hemiarthroplasty 01/11/1999 and 6   CARDIAC CATHETERIZATION  09/2001   negative   COLONOSCOPY     EYE SURGERY  2003   R&L cataract removed & IOL- 2003 & 2007,Dr.Jenkins   INGUINAL HERNIA REPAIR  2002   right, Dr. Luan Pulling   JOINT REPLACEMENT  2012   left knee replacement   LIPOMA EXCISION     back   MENISCECTOMY Bilateral    Dr. Thurston Hole   PROSTATE SURGERY  07/2006   for urinary urgency, Dr. Tiana Loft CUFF REPAIR Right October, 2016   sinus & throat surgery-1995  1995   TONSILLECTOMY     TOTAL KNEE ARTHROPLASTY  01/11/2011   Procedure: TOTAL KNEE ARTHROPLASTY;  Surgeon: Raymon Mutton, MD;  Location: [facility] OR;  Service: Orthopedics;  Laterality: Left;   TOTAL KNEE ARTHROPLASTY Right 11/22/2016    Procedure: TOTAL KNEE ARTHROPLASTY;  Surgeon: Dannielle Huh, MD;  Location: [facility] OR;  Service: Orthopedics;  Laterality: Right;   UVULECTOMY  1996   Dr. Lazarus Salines   VASECTOMY     WRIST SURGERY  1959   fracture- right   XI ROBOTIC ASSISTED VENTRAL HERNIA N/A 09/07/2020   Procedure: XI ROBOTIC ASSISTED DIAGNOSTIC LAPAROSCOPY;  Surgeon: Leafy Ro, MD;  Location: [facility] ORS;  Service: General;  Laterality: N/A;    Family History  Problem Relation Age of Onset   Sudden death  Father        accident   Coronary artery disease Mother    Emphysema Mother    Diverticulosis Mother    Thyroid disease Mother    Asthma Brother    Bone cancer Brother    Diabetes Paternal Grandmother    Sarcoidosis Son    Thyroid disease Daughter    Anesthesia problems Neg Hx    Hypotension Neg Hx    Malignant hyperthermia Neg Hx    Pseudochol deficiency Neg Hx    Colon cancer Neg Hx     Social History:  reports that he quit smoking about 55 years ago. His smoking use included cigarettes. He has a 18.00 pack-year smoking history. He has never used smokeless tobacco. He reports current alcohol use of about 4.0 standard drinks per week. He reports that he does not use drugs.  Allergies:  Allergies  Allergen Reactions   Simvastatin Other (See Comments)    MYALGIAS   Other     Other reaction(s): Unknown Other reaction(s): Unknown   Oxycodone Other (See Comments)    Severe constipation    Allergies as of 02/16/2021       Reactions   Simvastatin Other (See Comments)   MYALGIAS   Other    Other reaction(s): Unknown Other reaction(s): Unknown   Oxycodone Other (See Comments)   Severe constipation        Medication List        Accurate as of February 16, 2021  1:09 PM. If you have any questions, ask your nurse or doctor.          acetaminophen 500 MG tablet Commonly known as: TYLENOL Take 1,000 mg by mouth every 6 (six) hours as needed for mild pain. Takes 2 tablets nightly   ARTIFICIAL  TEARS OP Place 1 drop into both eyes 2 (two) times daily.   aspirin 81 MG tablet Take 81 mg by mouth every evening.   blood glucose meter kit and supplies Kit Inject 1 each into the skin as directed. Dispense based on patient and insurance preference. Use up to four times daily as directed. (FOR ICD-9 250.00, 250.01).   CALCIUM 600 + D PO Take 1 tablet by mouth daily.   celecoxib 200 MG capsule Commonly known as: CELEBREX Take 200 mg by mouth daily.   diclofenac Sodium 1 % Gel Commonly known as: VOLTAREN Apply 2 g topically at bedtime.   Fish Oil 1000 MG Caps Take 1,000 mg by mouth every evening.   FreeStyle Lite w/Device Kit 1 Device by Does not apply route daily.   glucose blood test strip Use as instructed to check once daily at various times   OneTouch Verio test strip Generic drug: glucose blood Use to check blood sugar daily   levothyroxine 175 MCG tablet Commonly known as: SYNTHROID Take 1 tablet (175 mcg total) by mouth daily.   losartan 50 MG tablet Commonly known as: COZAAR Take 1 tablet (50 mg total) by mouth daily.   multivitamin with minerals Tabs tablet Take 1 tablet by mouth daily.   NONFORMULARY OR COMPOUNDED ITEM Glucometer (Precision XTRA- Model #[id number])   onetouch ultrasoft lancets Use to check blood sugar daily   pantoprazole 40 MG tablet Commonly known as: Protonix Take 1 tablet (40 mg total) by mouth daily for 14 days.   polyethylene glycol 17 g packet Commonly known as: MiraLax Take 17 g by mouth daily.   Restora Caps One tab qd   rosuvastatin 5 MG tablet  Commonly known as: Crestor Take 1 tablet (5 mg total) by mouth at bedtime.   tamsulosin 0.4 MG Caps capsule Commonly known as: FLOMAX Take 0.4 mg by mouth 2 (two) times daily.   vitamin B-12 1000 MCG tablet Commonly known as: CYANOCOBALAMIN Take 1,000 mcg by mouth daily.         Review of Systems   Was prescribed losartan 50 mg for reportedly kidney  protection by PCP This has been reduced to half tablet daily since 3/22 because of low normal blood pressure He has a blood pressure monitor at home Has been Cortisol level has been normal  BP Readings from Last 3 Encounters:  02/16/21 140/78  12/17/20 118/70  12/01/20 130/70     He has history of mild diabetes with A1c as high as 7.1.  Also several years ago he did have blood sugars in the diabetic range  Glucose has been better controlled with last A1c improved He is using One Touch Verio monitor  Has not been treated with medications Has limited ability to exercise because of knee pain  Wt Readings from Last 3 Encounters:  02/16/21 190 lb 3.2 oz (86.3 kg)  12/17/20 190 lb 4 oz (86.3 kg)  12/01/20 188 lb (85.3 kg)   Lab Results  Component Value Date   HGBA1C 6.0 (A) 12/17/2020   HGBA1C 7.1 (H) 09/16/2020   HGBA1C 6.5 05/02/2020   Lab Results  Component Value Date   MICROALBUR 10 04/14/2017   LDLCALC 47 09/16/2020   CREATININE 0.90 10/13/2020           Examination:    BP 140/78    Pulse (!) 124    Ht 5\' 10"  (1.778 m)    Wt 190 lb 3.2 oz (86.3 kg)    SpO2 90%    BMI 27.29 kg/m    Assessment:   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 since he has had a long history of hyponatremia and CT scan did not show tumor He is fairly consistently asymptomatic Although previously his sodium had improved to 130 with demeclocycline 150 mg daily subsequently sodium had gone down again He  has been advised fluid restriction, especially with coffee which he has managed to follow Again he is asymptomatic now Weight is stable  Labs to be checked today   HYPOTHYROIDISM: He is subjectively doing well with 175 mcg levothyroxine daily Takes this in the morning  Mild diabetes: Although A1c has been as high as 7.1 it is now improved at 6 May have benefited from previous weight loss   PLAN:   Sodium and renal function to be checked  Since he is not currently  symptomatic he is not a candidate for [place], may use this only if sodium is below 125  Also recheck thyroid levels since he is now taking a steady dose of 175 mcg levothyroxine  Will continue to follow-up with his PCP regarding mild diabetes   Follow-up in 6 months if labs are in a reasonably good range for sodium and normal TSH  Brigitta Pricer 02/16/2021, 1:09 PM   Addendum:Sodium level is improved at 131, continue fluid restriction TSH 0.05, needs to go back to 150 mcg levothyroxine and will need follow-up in another 2 months,    Note: This office note was prepared with Insurance underwriter. Any transcriptional errors that result from this process are unintentional.

## 2021-02-17 ENCOUNTER — Other Ambulatory Visit: Payer: Self-pay

## 2021-02-17 DIAGNOSIS — E063 Autoimmune thyroiditis: Secondary | ICD-10-CM

## 2021-02-17 LAB — OSMOLALITY, URINE: Osmolality, Ur: 551 mOsmol/kg

## 2021-02-17 MED ORDER — LEVOTHYROXINE SODIUM 150 MCG PO TABS: 150.0000 ug | ORAL_TABLET | Freq: Every day | ORAL | 2 refills | Status: DC

## 2021-02-18 ENCOUNTER — Other Ambulatory Visit: Payer: Self-pay

## 2021-02-18 ENCOUNTER — Ambulatory Visit (INDEPENDENT_AMBULATORY_CARE_PROVIDER_SITE_OTHER): Payer: Medicare Other | Admitting: Podiatry

## 2021-02-18 DIAGNOSIS — M25571 Pain in right ankle and joints of right foot: Secondary | ICD-10-CM

## 2021-02-18 DIAGNOSIS — I872 Venous insufficiency (chronic) (peripheral): Secondary | ICD-10-CM

## 2021-02-18 DIAGNOSIS — M7751 Other enthesopathy of right foot: Secondary | ICD-10-CM

## 2021-02-18 NOTE — Progress Notes (Signed)
°  Subjective:  Patient ID: Kyle Simon, male    DOB: Nov 24, 1932,  MRN: [id number]  Chief Complaint  Patient presents with   Tendonitis    Right ankle pain, left ankle still swollen    87 y.o. male presents with the above complaint. History confirmed with patient.  The left side is doing well the swelling is still there but the pain is no longer bothering him.  He now has the same thing on the right side.   Objective:  Physical Exam: warm, good capillary refill, no trophic changes or ulcerative lesions, normal DP and PT pulses, normal monofilament exam, and normal sensory exam.  Pes planus deformity.  Chronic venous insufficiency with numerous varicose and MICA veins Left Foot: dystrophic yellowed discolored nail plates with subungual debris and left chronic ankle swelling, minimal to no pain in the ankle and sinus tarsi Right Foot: dystrophic yellowed discolored nail plates with subungual debris Swelling of the ankle and pain on palpation to the anterior joint line and sinus tarsi  Assessment:   1. Capsulitis of right ankle   2. Sinus tarsitis, right      Plan:  Patient was evaluated and treated and all questions answered.   Today he has similar pain and swelling in the right anterior ankle and sinus tarsi just like he did in the left, I also suspect that this is secondary to his pes planus deformity and secondary arthritis.  Discussed treatment of this including shoe gear bracing and injections.  Recommend injections of corticosteroid into the left ankle and sinus tarsi.  Following sterile prep with Betadine 2 mg of dexamethasone and 5 mg of Kenalog was injected into each joint separately.  He tolerated this well.  I will reevaluate him in February.  We also discussed getting a long-term ankle brace made for support and may have him follow-up with our orthotist at some point.    Also discussed chronic swelling and venous insufficiency with him and advised to try to walk, wear  compression stockings if needed and elevate legs when not on his feet.  No follow-ups on file.

## 2021-03-11 ENCOUNTER — Ambulatory Visit (INDEPENDENT_AMBULATORY_CARE_PROVIDER_SITE_OTHER): Payer: Medicare Other | Admitting: Podiatry

## 2021-03-11 ENCOUNTER — Encounter: Payer: Self-pay | Admitting: Podiatry

## 2021-03-11 ENCOUNTER — Other Ambulatory Visit: Payer: Self-pay

## 2021-03-11 ENCOUNTER — Ambulatory Visit (INDEPENDENT_AMBULATORY_CARE_PROVIDER_SITE_OTHER): Payer: Medicare Other

## 2021-03-11 DIAGNOSIS — M25571 Pain in right ankle and joints of right foot: Secondary | ICD-10-CM | POA: Diagnosis not present

## 2021-03-11 DIAGNOSIS — M7989 Other specified soft tissue disorders: Secondary | ICD-10-CM | POA: Diagnosis not present

## 2021-03-15 NOTE — Progress Notes (Signed)
°  Subjective:  Patient ID: Kyle Simon, male    DOB: 1932-10-24,  MRN: [id number]  Chief Complaint  Patient presents with   Foot Pain    "I have a shooting pain that goes up from my right ankle to up my leg.  My foot gave out on me last week."    88 y.o. male presents with the above complaint.  He rolled his ankle again and is having quite a bit of pain as well on the right side .  He has chronic knee issues as well as infections. Objective:  Physical Exam: warm, good capillary refill, no trophic changes or ulcerative lesions, normal DP and PT pulses, normal monofilament exam, and normal sensory exam.  Pes planus deformity.  Chronic venous insufficiency with numerous varicose and spider veins Left Foot: dystrophic yellowed discolored nail plates with subungual debris and left chronic ankle swelling, minimal to no pain in the ankle and sinus tarsi Right Foot: dystrophic yellowed discolored nail plates with subungual debris Swelling of the ankle and pain on palpation to the anterior joint line and sinus tarsi, swelling in the leg with pain in the posterior calf   New radiographs showed no significant change since last visit with degenerative changes in the subtalar and ankle joint Assessment:   1. Sinus tarsitis, right   2. Swelling of lower leg      Plan:  Patient was evaluated and treated and all questions answered.  Do think most of this is likely arthritic pain unfortunately.  He may have some peroneal tendinitis from his recent injury as well.  I discussed bracing, he may benefit from a long-term bracing we may have him schedule with our pedorthist to have this fitted.  I will reevaluate him again in about a month.  Again discussed chronic swelling and venous insufficiency.  I am ordering a DVT ultrasound to evaluate for possibility of DVT no I think this is unlikely.  Discussed wearing compression stockings.  No follow-ups on file.

## 2021-03-23 ENCOUNTER — Telehealth: Payer: Self-pay | Admitting: *Deleted

## 2021-03-23 NOTE — Telephone Encounter (Signed)
"  Dr. Sherryle Lis said I need to have a scan/ Ultrasound.  I haven't heard anything yet."

## 2021-03-25 NOTE — Addendum Note (Signed)
Addended bySherryle Lis, Tawania Daponte R on: 03/25/2021 01:55 PM   Modules accepted: Orders

## 2021-03-25 NOTE — Telephone Encounter (Signed)
I changed the order, I think it's correct now if you can coordinate. Thanks!

## 2021-03-26 ENCOUNTER — Telehealth: Payer: Self-pay | Admitting: *Deleted

## 2021-03-26 NOTE — Telephone Encounter (Signed)
I called [facility] for Radiology for [facility] and spoke to [place].  I inquired about Mr. Greenstreet.  She said she sees both orders for Mr. Holeman in the system.  I asked her what the protocol is for scheduling.  She said we need to contact them while the patient is in the office or we need to have the patient call them to schedule.  They do not call patients off the workqueue.  She asked me what I wanted to do.  I told her I would get the patient to call her.  I verified their phone number, [phone].  She stated that is correct.

## 2021-03-26 NOTE — Telephone Encounter (Signed)
"  I'm a patient of Dr. Sherryle Lis.  He had told me I needed to get a scan on the back of my leg for a possible blood clot.  It's been over two weeks.  I've called several time to tell you or whoever and I haven't heard from anybody with a number or a name of a clinic that I should go to and if they should call me or I should call them.  This is my third time and the last time.  I hope nothing comes loose on the back of my leg in the interim."

## 2021-03-26 NOTE — Telephone Encounter (Signed)
I attempted to call Mr. Davlin.  I left him a message to call [facility] Sched[facility]) at [phone].  I apologized to him that no one had returned his call.  I explained to him that there was some miscommunication on the protocol between [place] and [place].    Mr. Vecchione called me back.  I gave him the phone number for Central Scheduling.  I apologized to him for no one returning his call.  I told him we were not aware of [facility]'s  protocol for scheduling.  He said he would call them once he hung up with me.

## 2021-03-27 ENCOUNTER — Ambulatory Visit
Admission: RE | Admit: 2021-03-27 | Discharge: 2021-03-27 | Disposition: A | Discharge status: Home / Self Care | Payer: Medicare Other | Source: Ambulatory Visit | Attending: Podiatry | Admitting: Podiatry

## 2021-03-27 DIAGNOSIS — M7989 Other specified soft tissue disorders: Secondary | ICD-10-CM | POA: Insufficient documentation

## 2021-03-31 DIAGNOSIS — Z96651 Presence of right artificial knee joint: Secondary | ICD-10-CM | POA: Diagnosis not present

## 2021-03-31 DIAGNOSIS — T8484XD Pain due to internal orthopedic prosthetic devices, implants and grafts, subsequent encounter: Secondary | ICD-10-CM | POA: Diagnosis not present

## 2021-04-07 ENCOUNTER — Encounter (INDEPENDENT_AMBULATORY_CARE_PROVIDER_SITE_OTHER): Payer: Medicare Other | Admitting: Ophthalmology

## 2021-04-07 ENCOUNTER — Other Ambulatory Visit: Payer: Self-pay

## 2021-04-07 DIAGNOSIS — E113291 Type 2 diabetes mellitus with mild nonproliferative diabetic retinopathy without macular edema, right eye: Secondary | ICD-10-CM | POA: Diagnosis not present

## 2021-04-07 DIAGNOSIS — E113312 Type 2 diabetes mellitus with moderate nonproliferative diabetic retinopathy with macular edema, left eye: Secondary | ICD-10-CM

## 2021-04-07 DIAGNOSIS — H43813 Vitreous degeneration, bilateral: Secondary | ICD-10-CM

## 2021-04-13 ENCOUNTER — Ambulatory Visit: Payer: Medicare Other | Admitting: Podiatry

## 2021-04-15 ENCOUNTER — Ambulatory Visit: Payer: Medicare Other | Admitting: Podiatry

## 2021-04-19 DIAGNOSIS — J069 Acute upper respiratory infection, unspecified: Secondary | ICD-10-CM | POA: Diagnosis not present

## 2021-04-19 DIAGNOSIS — R059 Cough, unspecified: Secondary | ICD-10-CM | POA: Diagnosis not present

## 2021-04-19 DIAGNOSIS — Z20822 Contact with and (suspected) exposure to covid-19: Secondary | ICD-10-CM | POA: Diagnosis not present

## 2021-04-20 ENCOUNTER — Encounter: Payer: Self-pay | Admitting: Endocrinology

## 2021-04-20 ENCOUNTER — Ambulatory Visit (INDEPENDENT_AMBULATORY_CARE_PROVIDER_SITE_OTHER): Payer: Medicare Other | Admitting: Endocrinology

## 2021-04-20 ENCOUNTER — Ambulatory Visit (INDEPENDENT_AMBULATORY_CARE_PROVIDER_SITE_OTHER): Payer: Medicare Other | Admitting: Podiatry

## 2021-04-20 ENCOUNTER — Other Ambulatory Visit: Payer: Self-pay

## 2021-04-20 VITALS — BP 138/78 | HR 80 | Ht 70.0 in | Wt 191.0 lb

## 2021-04-20 DIAGNOSIS — B351 Tinea unguium: Secondary | ICD-10-CM | POA: Diagnosis not present

## 2021-04-20 DIAGNOSIS — M7989 Other specified soft tissue disorders: Secondary | ICD-10-CM | POA: Diagnosis not present

## 2021-04-20 DIAGNOSIS — M25571 Pain in right ankle and joints of right foot: Secondary | ICD-10-CM | POA: Diagnosis not present

## 2021-04-20 DIAGNOSIS — M79674 Pain in right toe(s): Secondary | ICD-10-CM | POA: Diagnosis not present

## 2021-04-20 DIAGNOSIS — E871 Hypo-osmolality and hyponatremia: Secondary | ICD-10-CM | POA: Diagnosis not present

## 2021-04-20 DIAGNOSIS — E119 Type 2 diabetes mellitus without complications: Secondary | ICD-10-CM

## 2021-04-20 DIAGNOSIS — E063 Autoimmune thyroiditis: Secondary | ICD-10-CM | POA: Diagnosis not present

## 2021-04-20 DIAGNOSIS — M25572 Pain in left ankle and joints of left foot: Secondary | ICD-10-CM

## 2021-04-20 DIAGNOSIS — M79675 Pain in left toe(s): Secondary | ICD-10-CM | POA: Diagnosis not present

## 2021-04-20 NOTE — Progress Notes (Signed)
Patient ID: Kyle Simon, male   DOB: [birth year], 88 y.o.   MRN: [id number]            Reason for Appointment: Endocrinology follow-up visit    History of Present Illness:   HYPONATREMIA:  Background history:  Serum sodium has been low since at least 2011 and has been as low as 125 in the past More recently lowest reading 127 He has not had any associated symptoms of decreased appetite, nausea or drowsiness Not on any thiazide diuretics or SSRI drugs Has had negative endocrine work-up for cortisol deficiencies  Has a history of drinking a lot of water partly for his constipation  Recent history: His urine osmolality was last high at 551 Was also high at 442 on initial evaluation Urine sodium was over 90 previously  He was previously told to cut back on his fluid intake especially coffee He is usually drinking only 2 cup of coffee now However he now admits that he has been drinking 4 cups of water in the morning as advised by his other physicians He will also drink 32 ounces of Gatorade during the day  With a trial of salt tablets his sodium did not improve, it was still down to 126 in 9/21 Also was having nausea from taking more than 1 regular tablet daily  Initially sodium was significantly better after starting DEMECLOCYCLINE 150 mg daily in 9/21 However this was stopped when his sodium decreased on the treatment down to 126  He feels good without any fatigue, decreased appetite or nausea Last sodium was 131  Labs pending from today  CT scan of chest did not show any tumor  Lab Results  Component Value Date   NA 131 (L) 02/16/2021   K 5.1 02/16/2021   CL 97 02/16/2021   CO2 25 02/16/2021     Hypothyroidism was first diagnosed  around 2005 years ago   At the time of diagnosis patient was not having symptoms of  fatigue, cold sensitivity, difficulty concentrating, dry skin, weight gain Likely had only done routine testing and was told that he was hypothyroid  and started him on unknown dose of levothyroxine  He did not start feeling any different when he started taking levothyroxine initially Also usually does not feel any different with dosage adjustments that have been made Apparently his dose was 150 g for some time but this was reduced in 06/2015 when his TSH was low normal Since then he has been taking 150 g 5 days a week and 125 g twice a week on-insulin initial consultation he was complaining that since spring time his energy level has been not as good and he has not been able to do as much with activities as he used to   He was referred here because his TSH is tending to be high but also his free T4 was relatively higher in 7/18  Recent history: On his initial consultation since his TSH was still relatively high and he was taking the equivalent of about 142 g of levothyroxine daily he was switched to taking 150 g once daily Also he was told to not take his levothyroxine at bedtime but take it before breakfast  He is taking his levothyroxine regularly before breakfast every morning Since his TSH was low in 12/22 he was told to reduce his levothyroxine, previously on 175 However he forgot to change his dose and not clear why he did not get his new prescription from mail order company  More recently has been on 150 mcg levothyroxine daily for about 3 weeks He has not changed the way he takes his levothyroxine  He has felt fairly good and no change in his weight No cold or heat intolerance  Labs pending   Patient's weight history is as follows:  Wt Readings from Last 3 Encounters:  04/20/21 191 lb (86.6 kg)  02/16/21 190 lb 3.2 oz (86.3 kg)  12/17/20 190 lb 4 oz (86.3 kg)    Thyroid function results have been as follows:  Lab Results  Component Value Date   TSH 0.05 (L) 02/16/2021   TSH 1.02 10/13/2020   TSH 0.55 06/30/2020   TSH 3.51 05/02/2020   FREET4 1.43 10/13/2020   FREET4 1.08 05/02/2020   FREET4 1.48 01/28/2020    FREET4 1.30 11/20/2019   T3FREE 3.2 12/28/2016   T3FREE 2.7 10/17/2015    Lab Results  Component Value Date   TSH 0.05 (L) 02/16/2021   TSH 1.02 10/13/2020   TSH 0.55 06/30/2020   TSH 3.51 05/02/2020   TSH 1.51 01/28/2020   TSH 2.22 11/20/2019   TSH 6.58 (H) 09/12/2019   TSH 3.78 07/24/2019   TSH 3.350 02/16/2019     Past Medical History:  Diagnosis Date   Arthritis    both hands;Dr.Deveshwar   Constipation due to pain medication    Diabetes mellitus without complication (HCC)    Type II. Uses no medication. Pt controls with diet and weight   Enlarged prostate    Frequent urination at night    GERD (gastroesophageal reflux disease)    History of noncompliance with medical treatment, presenting hazards to health 11/20/2018   Hyperlipidemia associated with type 2 diabetes mellitus (HCC) 04/19/2007   Qualifier: Diagnosis of  By: Alwyn Ren MD, William     Hypertension    Hypothyroidism    Polio    as a child w/o complications   Snoring    no sleep apnea   Statin declined-  11/20/2018   patient understands risks associated with this decision and has been advised to restart by cardiologist as well as myself several times   Torn meniscus    Transfusion history 2012   post TKR    Past Surgical History:  Procedure Laterality Date   ANTERIOR LAT LUMBAR FUSION Right 11/03/2017   Procedure: Right Lumbar three-four Anterolateral lumbar interbody fusion with lateral plate;  Surgeon: Maeola Harman, MD;  Location: [facility] OR;  Service: Neurosurgery;  Laterality: Right;   BACK SURGERY  2011   hemiarthroplasty 01/11/1999 and 6   CARDIAC CATHETERIZATION  09/2001   negative   COLONOSCOPY     EYE SURGERY  2003   R&L cataract removed & IOL- 2003 & 2007,Dr.Jenkins   INGUINAL HERNIA REPAIR  2002   right, Dr. Luan Pulling   JOINT REPLACEMENT  2012   left knee replacement   LIPOMA EXCISION     back   MENISCECTOMY Bilateral    Dr. Thurston Hole   PROSTATE SURGERY  07/2006   for urinary urgency,  Dr. Tiana Loft CUFF REPAIR Right October, 2016   sinus & throat surgery-1995  1995   TONSILLECTOMY     TOTAL KNEE ARTHROPLASTY  01/11/2011   Procedure: TOTAL KNEE ARTHROPLASTY;  Surgeon: Raymon Mutton, MD;  Location: [facility] OR;  Service: Orthopedics;  Laterality: Left;   TOTAL KNEE ARTHROPLASTY Right 11/22/2016   Procedure: TOTAL KNEE ARTHROPLASTY;  Surgeon: Dannielle Huh, MD;  Location: [facility] OR;  Service: Orthopedics;  Laterality: Right;  UVULECTOMY  1996   Dr. Lazarus Salines   VASECTOMY     WRIST SURGERY  1959   fracture- right   XI ROBOTIC ASSISTED VENTRAL HERNIA N/A 09/07/2020   Procedure: XI ROBOTIC ASSISTED DIAGNOSTIC LAPAROSCOPY;  Surgeon: Leafy Ro, MD;  Location: [facility] ORS;  Service: General;  Laterality: N/A;    Family History  Problem Relation Age of Onset   Sudden death Father        accident   Coronary artery disease Mother    Emphysema Mother    Diverticulosis Mother    Thyroid disease Mother    Asthma Brother    Bone cancer Brother    Diabetes Paternal Grandmother    Sarcoidosis Son    Thyroid disease Daughter    Anesthesia problems Neg Hx    Hypotension Neg Hx    Malignant hyperthermia Neg Hx    Pseudochol deficiency Neg Hx    Colon cancer Neg Hx     Social History:  reports that he quit smoking about 55 years ago. His smoking use included cigarettes. He has a 18.00 pack-year smoking history. He has never used smokeless tobacco. He reports current alcohol use of about 4.0 standard drinks per week. He reports that he does not use drugs.  Allergies:  Allergies  Allergen Reactions   Simvastatin Other (See Comments)    MYALGIAS   Other     Other reaction(s): Unknown Other reaction(s): Unknown   Oxycodone Other (See Comments)    Severe constipation    Allergies as of 04/20/2021       Reactions   Simvastatin Other (See Comments)   MYALGIAS   Other    Other reaction(s): Unknown Other reaction(s): Unknown   Oxycodone Other (See Comments)   Severe  constipation        Medication List        Accurate as of April 20, 2021  1:34 PM. If you have any questions, ask your nurse or doctor.          acetaminophen 500 MG tablet Commonly known as: TYLENOL Take 1,000 mg by mouth every 6 (six) hours as needed for mild pain. Takes 2 tablets nightly   ARTIFICIAL TEARS OP Place 1 drop into both eyes 2 (two) times daily.   aspirin 81 MG tablet Take 81 mg by mouth every evening.   blood glucose meter kit and supplies Kit Inject 1 each into the skin as directed. Dispense based on patient and insurance preference. Use up to four times daily as directed. (FOR ICD-9 250.00, 250.01).   CALCIUM 600 + D PO Take 1 tablet by mouth daily.   celecoxib 200 MG capsule Commonly known as: CELEBREX Take 200 mg by mouth daily.   diclofenac Sodium 1 % Gel Commonly known as: VOLTAREN Apply 2 g topically at bedtime.   Fish Oil 1000 MG Caps Take 1,000 mg by mouth every evening.   FreeStyle Lite w/Device Kit 1 Device by Does not apply route daily.   glucose blood test strip Use as instructed to check once daily at various times   OneTouch Verio test strip Generic drug: glucose blood Use to check blood sugar daily   levothyroxine 150 MCG tablet Commonly known as: SYNTHROID Take 1 tablet (150 mcg total) by mouth daily.   losartan 50 MG tablet Commonly known as: COZAAR Take 1 tablet (50 mg total) by mouth daily.   multivitamin with minerals Tabs tablet Take 1 tablet by mouth daily.   NONFORMULARY OR COMPOUNDED  ITEM Glucometer (Precision XTRA- Model #[id number])   onetouch ultrasoft lancets Use to check blood sugar daily   pantoprazole 40 MG tablet Commonly known as: Protonix Take 1 tablet (40 mg total) by mouth daily for 14 days.   polyethylene glycol 17 g packet Commonly known as: MiraLax Take 17 g by mouth daily.   Restora Caps One tab qd   rosuvastatin 5 MG tablet Commonly known as: Crestor Take 1 tablet (5 mg  total) by mouth at bedtime.   tamsulosin 0.4 MG Caps capsule Commonly known as: FLOMAX Take 0.4 mg by mouth 2 (two) times daily.   vitamin B-12 1000 MCG tablet Commonly known as: CYANOCOBALAMIN Take 1,000 mcg by mouth daily.         Review of Systems   Was prescribed losartan 50 mg for reportedly kidney protection by PCP This has been reduced to half tablet daily since 3/22 because of low normal blood pressure He has a blood pressure monitor at home  Cortisol level has been normal  BP Readings from Last 3 Encounters:  04/20/21 138/78  02/16/21 140/78  12/17/20 118/70     He has history of mild diabetes with A1c as high as 7.1.  Also several years ago he did have blood sugars in the diabetic range  Glucose has been better controlled with last A1c improved He is using One Touch Verio monitor and he says his blood sugars are between 95-120  Has not been treated with medications Has limited ability to exercise because of knee pain  Wt Readings from Last 3 Encounters:  04/20/21 191 lb (86.6 kg)  02/16/21 190 lb 3.2 oz (86.3 kg)  12/17/20 190 lb 4 oz (86.3 kg)   Lab Results  Component Value Date   HGBA1C 6.0 (A) 12/17/2020   HGBA1C 7.1 (H) 09/16/2020   HGBA1C 6.5 05/02/2020   Lab Results  Component Value Date   MICROALBUR 10 04/14/2017   LDLCALC 47 09/16/2020   CREATININE 0.87 02/16/2021           Examination:    BP 138/78    Pulse 80    Ht 5\' 10"  (1.778 m)    Wt 191 lb (86.6 kg)    SpO2 (!) 85%    BMI 27.41 kg/m    Assessment:   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 since he has had a long history of hyponatremia and CT scan did not show tumor He is consistently asymptomatic  Levels as low as 125  Again he is asymptomatic now He knowledges that he is drinking 4 cups of water in the morning, also is ingesting 2 cups of coffee a day and at least 2 more cups of Gatorade daily  Labs to be checked today   HYPOTHYROIDISM: He has had  inconsistent TSH levels Although he was supposed to reduce his dose to 150 mcg after his last visit he has done this only in the last 3 weeks  Mild diabetes: Blood sugars are relatively normal at home   PLAN:   We will check labs today including thyroid and electrolytes He will reduce his his water intake to only when he is thirsty and discussed that since he is retaining excess water this is not helping him in any way To call if he has any unusual fatigue or lethargy  Follow-up in 6 months if labs are in a good range for sodium and normal TSH  Francesca Strome 04/20/2021, 1:34 PM      Note:  This office note was prepared with Insurance underwriter. Any transcriptional errors that result from this process are unintentional.

## 2021-04-20 NOTE — Patient Instructions (Addendum)
No excess water in am

## 2021-04-20 NOTE — Progress Notes (Signed)
°  Subjective:  Patient ID: Kyle Simon, male    DOB: 1932/10/03,  MRN: [id number]  Chief Complaint  Patient presents with   Ankle Pain   Nail Problem    "Its doing a lot better.  Can he trim my nails today"    88 y.o. male presents with the above complaint.  Doing better on the right side now the swelling has gone down quite a bit  Objective:  Physical Exam: warm, good capillary refill, no trophic changes or ulcerative lesions, normal DP and PT pulses, normal monofilament exam, and normal sensory exam.  Pes planus deformity.  Chronic venous insufficiency with numerous varicose and spider veins.  Swelling on the right side has resolved Left Foot: dystrophic yellowed discolored nail plates with subungual debris and left chronic ankle swelling, minimal to no pain in the ankle and sinus tarsi Right Foot: dystrophic yellowed discolored nail plates with subungual debris No swelling in the right side today   New radiographs showed no significant change since last visit with degenerative changes in the subtalar and ankle joint  Ultrasound negative for DVT and phlebitis no masses seen Assessment:   1. Sinus tarsitis, right   2. Swelling of lower leg   3. Sinus tarsitis, left   4. Pain due to onychomycosis of toenails of both feet       Plan:  Patient was evaluated and treated and all questions answered.  Discussed the etiology and treatment options for the condition in detail with the patient. Educated patient on the topical and oral treatment options for mycotic nails. Recommended debridement of the nails today. Sharp and mechanical debridement performed of all painful and mycotic nails today. Nails debrided in length and thickness using a nail nipper to level of comfort. Discussed treatment options including appropriate shoe gear. Follow up as needed for painful nails.  Dispensed compression sleeve and he will wear this to control swelling has been successful on the other side.   Could consider further injections spaced out every 3 to 4 months.  Return in about 9 weeks (around 06/22/2021) for painful thick nails, f/u bilateral arthritis and injections .

## 2021-04-21 ENCOUNTER — Other Ambulatory Visit (INDEPENDENT_AMBULATORY_CARE_PROVIDER_SITE_OTHER): Payer: Medicare Other

## 2021-04-21 DIAGNOSIS — E871 Hypo-osmolality and hyponatremia: Secondary | ICD-10-CM | POA: Diagnosis not present

## 2021-04-21 DIAGNOSIS — E063 Autoimmune thyroiditis: Secondary | ICD-10-CM

## 2021-04-22 LAB — BASIC METABOLIC PANEL
BUN: 9 mg/dL (ref 6–23)
CO2: 27 mEq/L (ref 19–32)
Calcium: 8.9 mg/dL (ref 8.4–10.5)
Chloride: 96 mEq/L (ref 96–112)
Creatinine, Ser: 0.82 mg/dL (ref 0.40–1.50)
GFR: 79.73 mL/min (ref >60.00)
Glucose, Bld: 175 mg/dL — ABNORMAL HIGH (ref 70–99)
Potassium: 4.5 mEq/L (ref 3.5–5.1)
Sodium: 128 mEq/L — ABNORMAL LOW (ref 135–145)

## 2021-04-22 LAB — TSH: TSH: 0.1 u[IU]/mL — ABNORMAL LOW (ref 0.35–5.50)

## 2021-04-22 LAB — T4, FREE: Free T4: 1.67 ng/dL — ABNORMAL HIGH (ref 0.60–1.60)

## 2021-04-28 MED ORDER — LEVOTHYROXINE SODIUM 137 MCG PO TABS: 137.0000 ug | ORAL_TABLET | Freq: Every day | ORAL | 2 refills | Status: DC

## 2021-05-04 ENCOUNTER — Encounter: Payer: Self-pay | Admitting: Dermatology

## 2021-05-04 ENCOUNTER — Ambulatory Visit (INDEPENDENT_AMBULATORY_CARE_PROVIDER_SITE_OTHER): Payer: Medicare Other | Admitting: Dermatology

## 2021-05-04 ENCOUNTER — Other Ambulatory Visit: Payer: Self-pay

## 2021-05-04 DIAGNOSIS — L309 Dermatitis, unspecified: Secondary | ICD-10-CM

## 2021-05-04 DIAGNOSIS — Z1283 Encounter for screening for malignant neoplasm of skin: Secondary | ICD-10-CM | POA: Diagnosis not present

## 2021-05-04 DIAGNOSIS — L570 Actinic keratosis: Secondary | ICD-10-CM | POA: Diagnosis not present

## 2021-05-04 MED ORDER — CLOBETASOL PROP EMOLLIENT BASE 0.05 % EX CREA: 1.0000 "application " | TOPICAL_CREAM | Freq: Two times a day (BID) | CUTANEOUS | 4 refills | Status: DC

## 2021-05-05 DIAGNOSIS — Z471 Aftercare following joint replacement surgery: Secondary | ICD-10-CM | POA: Diagnosis not present

## 2021-05-05 DIAGNOSIS — M25561 Pain in right knee: Secondary | ICD-10-CM | POA: Diagnosis not present

## 2021-05-05 DIAGNOSIS — Z96651 Presence of right artificial knee joint: Secondary | ICD-10-CM | POA: Diagnosis not present

## 2021-05-08 DIAGNOSIS — R059 Cough, unspecified: Secondary | ICD-10-CM | POA: Diagnosis not present

## 2021-05-08 DIAGNOSIS — Z20822 Contact with and (suspected) exposure to covid-19: Secondary | ICD-10-CM | POA: Diagnosis not present

## 2021-05-08 DIAGNOSIS — R051 Acute cough: Secondary | ICD-10-CM | POA: Diagnosis not present

## 2021-05-10 DIAGNOSIS — Z20822 Contact with and (suspected) exposure to covid-19: Secondary | ICD-10-CM | POA: Diagnosis not present

## 2021-05-11 ENCOUNTER — Encounter: Payer: Self-pay | Admitting: Dermatology

## 2021-05-11 NOTE — Progress Notes (Signed)
? ?  Follow-Up Visit ?  ?Subjective  ?Kyle Simon is a 88 y.o. male who presents for the following: Annual Exam (Left & right temple x 2 months- "itch" & both knees have been itch- tx - none). ? ?Crusts on face, itching on legs ?Location:  ?Duration:  ?Quality:  ?Associated Signs/Symptoms: ?Modifying Factors:  ?Severity:  ?Timing: ?Context:  ? ?Objective  ?Well appearing patient in no apparent distress; mood and affect are within normal limits. ?Left Knee - Anterior, Right Knee - Anterior ?Left knee itching with visible eczematous patches on medial leg, right knee edges with no visible rash.  This may be a mixture of an eczematous inflammatory process plus a neurogenic pruritus. ? ?left temple, right temple ?Pink gritty 43m crusts ? ? ? ?All skin waist up examined.  Plus leg ? ? ?Assessment & Plan  ? ? ?Eczema, unspecified type ?Left Knee - Anterior; Right Knee - Anterior ? ?We will use clobetasol topical daily after bathing on areas that itch for 4 weeks, follow-up by MyChart or phone at that time. ? ?Clobetasol Prop Emollient Base (CLOBETASOL PROPIONATE E) 0.05 % emollient cream - Left Knee - Anterior, Right Knee - Anterior ?Apply 1 application topically 2 (two) times daily. ? ?AK (actinic keratosis) (2) ?left temple; right temple ? ?Destruction of lesion - left temple, right temple ?Complexity: simple   ?Destruction method: cryotherapy   ?Informed consent: discussed and consent obtained   ?Timeout:  patient name, date of birth, surgical site, and procedure verified ?Lesion destroyed using liquid nitrogen: Yes   ?Cryotherapy cycles:  3 ?Outcome: patient tolerated procedure well with no complications   ? ? ? ? ? ?I, SLavonna Monarch MD, have reviewed all documentation for this visit.  The documentation on 05/11/21 for the exam, diagnosis, procedures, and orders are all accurate and complete. ?

## 2021-05-29 DIAGNOSIS — R3914 Feeling of incomplete bladder emptying: Secondary | ICD-10-CM | POA: Diagnosis not present

## 2021-06-10 DIAGNOSIS — Z20822 Contact with and (suspected) exposure to covid-19: Secondary | ICD-10-CM | POA: Diagnosis not present

## 2021-06-11 DIAGNOSIS — R3914 Feeling of incomplete bladder emptying: Secondary | ICD-10-CM | POA: Diagnosis not present

## 2021-06-11 DIAGNOSIS — N401 Enlarged prostate with lower urinary tract symptoms: Secondary | ICD-10-CM | POA: Diagnosis not present

## 2021-06-15 ENCOUNTER — Ambulatory Visit (INDEPENDENT_AMBULATORY_CARE_PROVIDER_SITE_OTHER): Payer: Medicare Other | Admitting: Endocrinology

## 2021-06-15 VITALS — BP 132/64 | HR 51 | Ht 70.0 in | Wt 188.0 lb

## 2021-06-15 DIAGNOSIS — E871 Hypo-osmolality and hyponatremia: Secondary | ICD-10-CM

## 2021-06-15 DIAGNOSIS — E063 Autoimmune thyroiditis: Secondary | ICD-10-CM | POA: Diagnosis not present

## 2021-06-15 LAB — BASIC METABOLIC PANEL
BUN: 20 mg/dL (ref 6–23)
CO2: 25 mEq/L (ref 19–32)
Calcium: 9 mg/dL (ref 8.4–10.5)
Chloride: 94 mEq/L — ABNORMAL LOW (ref 96–112)
Creatinine, Ser: 0.89 mg/dL (ref 0.40–1.50)
GFR: 77.71 mL/min (ref >60.00)
Glucose, Bld: 111 mg/dL — ABNORMAL HIGH (ref 70–99)
Potassium: 5.3 mEq/L — ABNORMAL HIGH (ref 3.5–5.1)
Sodium: 126 mEq/L — ABNORMAL LOW (ref 135–145)

## 2021-06-15 LAB — TSH: TSH: 0.44 u[IU]/mL (ref 0.35–5.50)

## 2021-06-15 LAB — T4, FREE: Free T4: 1.24 ng/dL (ref 0.60–1.60)

## 2021-06-15 MED ORDER — FLUDROCORTISONE ACETATE 0.1 MG PO TABS: ORAL_TABLET | ORAL | 3 refills | Status: DC

## 2021-06-15 NOTE — Progress Notes (Signed)
Patient ID: Kyle Simon, male   DOB: April 16, 1932, 87 y.o.   MRN: [id number] ?       ? ? ? ? ?Reason for Appointment: Endocrinology follow-up visit ? ? ? History of Present Illness:  ? ?HYPONATREMIA: ? ?Background history: ? ?Serum sodium has been low since at least 2011 and has been as low as 125 in the past ?More recently lowest reading 127 ?He has not had any associated symptoms of decreased appetite, nausea or drowsiness ?Not on any thiazide diuretics or SSRI drugs ?Has had negative endocrine work-up for cortisol deficiencies ? ?Has a history of drinking a lot of water partly for his constipation ? ?Recent history: ?His urine osmolality was last high at 551 ?Was also high at 442 on initial evaluation ?Urine sodium was over 90 previously ? ?He was previously told to cut back on his fluid intake especially coffee ?He is usually drinking only 2 cup of coffee now ?However he still has been drinking 2 cups of water in the morning but has cut back on Gatorade ? ?With a trial of salt tablets his sodium did not improve, it was still down to 126 in 9/21 ?Also was having nausea from taking more than 1 regular tablet daily ? ?Initially sodium was significantly better after starting DEMECLOCYCLINE 150 mg daily in 9/21 ?However this was stopped when his sodium decreased on the treatment down to 126 ? ?He feels good without any fatigue, decreased appetite or nausea ?Last sodium was 128 ? ?Labs pending from today ? ?CT scan of chest did not show any tumor ? ?Lab Results  ?Component Value Date  ? NA 128 (L) 04/21/2021  ? K 4.5 04/21/2021  ? CL 96 04/21/2021  ? CO2 27 04/21/2021  ? ? ? ?Hypothyroidism was first diagnosed  around 2005 years ago  ? ?At the time of diagnosis patient was not having symptoms of  fatigue, cold sensitivity, difficulty concentrating, dry skin, weight gain ?Likely had only done routine testing and was told that he was hypothyroid and started him on unknown dose of levothyroxine ? ?He did not start  feeling any different when he started taking levothyroxine initially ?Also usually does not feel any different with dosage adjustments that have been made ?Apparently his dose was 150 ?g for some time but this was reduced in 06/2015 when his TSH was low normal ?Since then he has been taking 150 ?g 5 days a week and 125 ?g twice a week ?on-insulin initial consultation he was complaining that since spring time his energy level has been not as good and he has not been able to do as much with activities as he used to  ? ?He was referred here because his TSH is tending to be high but also his free T4 was relatively higher in 7/18 ? ?Recent history: ?On his initial consultation since his TSH was still relatively high and he was taking the equivalent of about 142 ?g of levothyroxine daily he was switched to taking 150 ?g once daily ?Also he was told to not take his levothyroxine at bedtime but take it before breakfast ? ?He is taking his levothyroxine regularly before breakfast every morning ?The dose has been gradually decreased the last 2 times ? ?More recently has been on 137 mcg levothyroxine daily for about 8 weeks ?He has not changed the way he takes his levothyroxine ? ?He does have some fatigue now, weight is down slightly ? ?Labs pending ?  ?Patient's weight history is  as follows: ? ?Wt Readings from Last 3 Encounters:  ?06/15/21 188 lb (85.3 kg)  ?04/20/21 191 lb (86.6 kg)  ?02/16/21 190 lb 3.2 oz (86.3 kg)  ? ? ?Thyroid function results have been as follows: ? ?Lab Results  ?Component Value Date  ? TSH 0.10 (L) 04/21/2021  ? TSH 0.05 (L) 02/16/2021  ? TSH 1.02 10/13/2020  ? TSH 0.55 06/30/2020  ? FREET4 1.67 (H) 04/21/2021  ? FREET4 1.43 10/13/2020  ? FREET4 1.08 05/02/2020  ? FREET4 1.48 01/28/2020  ? T3FREE 3.2 12/28/2016  ? T3FREE 2.7 10/17/2015  ? ? ?Lab Results  ?Component Value Date  ? TSH 0.10 (L) 04/21/2021  ? TSH 0.05 (L) 02/16/2021  ? TSH 1.02 10/13/2020  ? TSH 0.55 06/30/2020  ? TSH 3.51 05/02/2020  ?  TSH 1.51 01/28/2020  ? TSH 2.22 11/20/2019  ? TSH 6.58 (H) 09/12/2019  ? TSH 3.78 07/24/2019  ? ? ? ?Past Medical History:  ?Diagnosis Date  ? Arthritis   ? both hands;Dr.Deveshwar  ? Constipation due to pain medication   ? Diabetes mellitus without complication ([place])   ? Type II. Uses no medication. Pt controls with diet and weight  ? Enlarged prostate   ? Frequent urination at night   ? GERD (gastroesophageal reflux disease)   ? History of noncompliance with medical treatment, presenting hazards to health 11/20/2018  ? Hyperlipidemia associated with type 2 diabetes mellitus ([place]) 04/19/2007  ? Qualifier: Diagnosis of  By: Linna Darner MD, Gwyndolyn Saxon    ? Hypertension   ? Hypothyroidism   ? Polio   ? as a child w/o complications  ? Snoring   ? no sleep apnea  ? Statin declined-  11/20/2018  ? patient understands risks associated with this decision and has been advised to restart by cardiologist as well as myself several times  ? Torn meniscus   ? Transfusion history 2012  ? post TKR  ? ? ?Past Surgical History:  ?Procedure Laterality Date  ? ANTERIOR LAT LUMBAR FUSION Right 11/03/2017  ? Procedure: Right Lumbar three-four Anterolateral lumbar interbody fusion with lateral plate;  Surgeon: Erline Levine, MD;  Location: [facility];  Service: Neurosurgery;  Laterality: Right;  ? BACK SURGERY  2011  ? hemiarthroplasty 01/11/1999 and 6  ? CARDIAC CATHETERIZATION  09/2001  ? negative  ? COLONOSCOPY    ? EYE SURGERY  2003  ? R&L cataract removed & IOL- 2003 & 2007,Dr.Jenkins  ? INGUINAL HERNIA REPAIR  2002  ? right, Dr. Truitt Leep  ? JOINT REPLACEMENT  2012  ? left knee replacement  ? LIPOMA EXCISION    ? back  ? MENISCECTOMY Bilateral   ? Dr. Noemi Chapel  ? PROSTATE SURGERY  07/2006  ? for urinary urgency, Dr. Risa Grill  ? ROTATOR CUFF REPAIR Right October, 2016  ? sinus & throat surgery-1995  1995  ? TONSILLECTOMY    ? TOTAL KNEE ARTHROPLASTY  01/11/2011  ? Procedure: TOTAL KNEE ARTHROPLASTY;  Surgeon: Rudean Haskell, MD;  Location: [facility];   Service: Orthopedics;  Laterality: Left;  ? TOTAL KNEE ARTHROPLASTY Right 11/22/2016  ? Procedure: TOTAL KNEE ARTHROPLASTY;  Surgeon: Vickey Huger, MD;  Location: [facility];  Service: Orthopedics;  Laterality: Right;  ? [place]  ? Dr. Erik Obey  ? VASECTOMY    ? WRIST SURGERY  1959  ? fracture- right  ? XI ROBOTIC ASSISTED VENTRAL HERNIA N/A 09/07/2020  ? Procedure: XI ROBOTIC ASSISTED DIAGNOSTIC LAPAROSCOPY;  Surgeon: Jules Husbands, MD;  Location:  [facility];  Service: General;  Laterality: N/A;  ? ? ?Family History  ?Problem Relation Age of Onset  ? Sudden death Father   ?     accident  ? Coronary artery disease Mother   ? Emphysema Mother   ? Diverticulosis Mother   ? Thyroid disease Mother   ? Asthma Brother   ? Bone cancer Brother   ? Diabetes Paternal Grandmother   ? Sarcoidosis Son   ? Thyroid disease Daughter   ? Anesthesia problems Neg Hx   ? Hypotension Neg Hx   ? Malignant hyperthermia Neg Hx   ? Pseudochol deficiency Neg Hx   ? Colon cancer Neg Hx   ? ? ?Social History:  reports that he quit smoking about 55 years ago. His smoking use included cigarettes. He has a 18.00 pack-year smoking history. He has never used smokeless tobacco. He reports current alcohol use of about 4.0 standard drinks per week. He reports that he does not use drugs. ? ?Allergies:  ?Allergies  ?Allergen Reactions  ? Simvastatin Other (See Comments)  ?  MYALGIAS  ? Other   ?  Other reaction(s): Unknown ?Other reaction(s): Unknown  ? Oxycodone Other (See Comments)  ?  Severe constipation  ? ? ?Allergies as of 06/15/2021   ? ?   Reactions  ? Simvastatin Other (See Comments)  ? MYALGIAS  ? Other   ? Other reaction(s): Unknown ?Other reaction(s): Unknown  ? Oxycodone Other (See Comments)  ? Severe constipation  ? ?  ? ?  ?Medication List  ?  ? ?  ? Accurate as of June 15, 2021  2:06 PM. If you have any questions, ask your nurse or doctor.  ?  ?  ? ?  ? ?acetaminophen 500 MG tablet ?Commonly known as: TYLENOL ?Take 1,000 mg by mouth  every 6 (six) hours as needed for mild pain. Takes 2 tablets nightly ?  ?ARTIFICIAL TEARS OP ?Place 1 drop into both eyes 2 (two) times daily. ?  ?aspirin 81 MG tablet ?Take 81 mg by mouth every evening. ?  ?bloo

## 2021-06-16 ENCOUNTER — Other Ambulatory Visit: Payer: Self-pay

## 2021-06-16 DIAGNOSIS — E871 Hypo-osmolality and hyponatremia: Secondary | ICD-10-CM

## 2021-06-16 LAB — CORTISOL: Cortisol, Plasma: 6.1 ug/dL

## 2021-06-16 NOTE — Addendum Note (Signed)
Addended by: Kaylyn Lim I on: 06/16/2021 07:34 AM ? ? Modules accepted: Orders ? ?

## 2021-06-17 ENCOUNTER — Ambulatory Visit (INDEPENDENT_AMBULATORY_CARE_PROVIDER_SITE_OTHER): Payer: Medicare Other | Admitting: Family Medicine

## 2021-06-17 ENCOUNTER — Ambulatory Visit: Payer: Medicare Other | Admitting: Family Medicine

## 2021-06-17 VITALS — BP 108/60 | HR 87 | Temp 97.7°F | Wt 187.4 lb

## 2021-06-17 DIAGNOSIS — G8929 Other chronic pain: Secondary | ICD-10-CM

## 2021-06-17 DIAGNOSIS — M25562 Pain in left knee: Secondary | ICD-10-CM | POA: Diagnosis not present

## 2021-06-17 DIAGNOSIS — E1169 Type 2 diabetes mellitus with other specified complication: Secondary | ICD-10-CM | POA: Diagnosis not present

## 2021-06-17 DIAGNOSIS — I152 Hypertension secondary to endocrine disorders: Secondary | ICD-10-CM

## 2021-06-17 DIAGNOSIS — E1159 Type 2 diabetes mellitus with other circulatory complications: Secondary | ICD-10-CM | POA: Diagnosis not present

## 2021-06-17 DIAGNOSIS — E785 Hyperlipidemia, unspecified: Secondary | ICD-10-CM

## 2021-06-17 DIAGNOSIS — D509 Iron deficiency anemia, unspecified: Secondary | ICD-10-CM | POA: Diagnosis not present

## 2021-06-17 LAB — CBC WITH DIFFERENTIAL/PLATELET
Basophils Absolute: 0 10*3/uL (ref 0.0–0.1)
Basophils Relative: 0.5 % (ref 0.0–3.0)
Eosinophils Absolute: 0.1 10*3/uL (ref 0.0–0.7)
Eosinophils Relative: 2.6 % (ref 0.0–5.0)
HCT: 33.8 % — ABNORMAL LOW (ref 39.0–52.0)
Hemoglobin: 11.6 g/dL — ABNORMAL LOW (ref 13.0–17.0)
Lymphocytes Relative: 20.9 % (ref 12.0–46.0)
Lymphs Abs: 1.2 10*3/uL (ref 0.7–4.0)
MCHC: 34.2 g/dL (ref 30.0–36.0)
MCV: 89.2 fl (ref 78.0–100.0)
Monocytes Absolute: 0.6 10*3/uL (ref 0.1–1.0)
Monocytes Relative: 10.7 % (ref 3.0–12.0)
Neutro Abs: 3.7 10*3/uL (ref 1.4–7.7)
Neutrophils Relative %: 65.3 % (ref 43.0–77.0)
Platelets: 252 10*3/uL (ref 150.0–400.0)
RBC: 3.79 Mil/uL — ABNORMAL LOW (ref 4.22–5.81)
RDW: 13 % (ref 11.5–15.5)
WBC: 5.7 10*3/uL (ref 4.0–10.5)

## 2021-06-17 LAB — POCT GLYCOSYLATED HEMOGLOBIN (HGB A1C): Hemoglobin A1C: 6.3 % — AB (ref 4.0–5.6)

## 2021-06-17 LAB — FERRITIN: Ferritin: 28.6 ng/mL (ref 22.0–322.0)

## 2021-06-17 NOTE — Assessment & Plan Note (Signed)
Pt with worsening fatigue. May be related to SIADH and general chronic conditions, however, does have chronic recurrent anemia. Will reassess and consider iron replacement if needed. He also recently had his levothyroxine dose adjusted which may help.  ?

## 2021-06-17 NOTE — Assessment & Plan Note (Signed)
Lab Results  ?Component Value Date  ? LDLCALC 47 09/16/2020  ?At goal, cont crestor 5 mg ? ?

## 2021-06-17 NOTE — Assessment & Plan Note (Addendum)
Lab Results  ?Component Value Date  ? HGBA1C 6.3 (A) 06/17/2021  ?c/b HTN, HLD. Continue healthy diet. Controlled.  ? ?

## 2021-06-17 NOTE — Patient Instructions (Addendum)
Pain ?- try to decrease celebrex to once daily or as needed ? ?Potassium ?- stop Losartan ?- check blood pressure ?- message in 1-2 weeks with home blood pressure readings ? ?Diabetes ?- continue to work on diet ?- update if Glucose >140 consistently ? ?Fatigue ?- blood work today ?- checking for anemia ?- walk as much as you can ?- continue healthy diet ?- if not better in 2-3 months - come back ?

## 2021-06-17 NOTE — Assessment & Plan Note (Signed)
BP controlled, however, with hyperkalemia. Will stop losartan and monitor K and kidney function. He will monitor bp at home and update. If elevated, consider starting amlodipine if elevated due to no impact on electrolytes.  ?

## 2021-06-17 NOTE — Progress Notes (Signed)
? ?Subjective:  ? ?  ?Kyle Simon is a 88 y.o. male presenting for Follow-up (6 mo DM ) ?  ? ? ?HPI ? ?#Diabetes ?Currently taking diet controlled  ?Using medications without difficulties n/a ?Hypoglycemic episodes:No  ?Hyperglycemic episodes:No  ?Feet problems:No  ?Blood Sugars averaging: typically 100-105 ?Last HgbA1c:  ?Lab Results  ?Component Value Date  ? HGBA1C 6.3 (A) 06/17/2021  ? ? ?Diabetes Health Maintenance Due:  ?  ?Diabetes Health Maintenance Due  ?Topic Date Due  ? FOOT EXAM  06/03/2020  ? OPHTHALMOLOGY EXAM  03/07/2021  ? HEMOGLOBIN A1C  12/17/2021  ? ?#fatigued ?- sometimes the pulse ox at home 93%, but no sob ?- no cp ?- had robotic surgery in July 2022 for bowel obstruction ?- has been using miralax to help with BM - after 2 days ?- no blood in stool or urine ?- no hair loss or skin changes ?- no heat/cold intolerance ?- follows with Dr. Dwyane Dee - just increased levothyroxine to 150 mcg ? ? ? ?Review of Systems ? ? ?Social History  ? ?Tobacco Use  ?Smoking Status Former  ? Packs/day: 1.00  ? Years: 18.00  ? Pack years: 18.00  ? Types: Cigarettes  ? Quit date: 03/01/1966  ? Years since quitting: 55.3  ?Smokeless Tobacco Never  ? ? ? ?   ?Objective:  ?  ?BP Readings from Last 3 Encounters:  ?06/17/21 108/60  ?06/15/21 132/64  ?04/20/21 138/78  ? ?Wt Readings from Last 3 Encounters:  ?06/17/21 187 lb 6 oz (85 kg)  ?06/15/21 188 lb (85.3 kg)  ?04/20/21 191 lb (86.6 kg)  ? ? ?BP 108/60   Pulse 87   Temp 97.7 ?F (36.5 ?C) (Oral)   Wt 187 lb 6 oz (85 kg)   BMI 26.89 kg/m?  ? ? ?Physical Exam ?Constitutional:   ?   Appearance: Normal appearance. He is not ill-appearing or diaphoretic.  ?HENT:  ?   Right Ear: External ear normal.  ?   Left Ear: External ear normal.  ?   Nose: Nose normal.  ?Eyes:  ?   General: No scleral icterus. ?   Extraocular Movements: Extraocular movements intact.  ?   Conjunctiva/sclera: Conjunctivae normal.  ?Cardiovascular:  ?   Rate and Rhythm: Normal rate and regular rhythm.   ?   Heart sounds: Murmur heard.  ?Pulmonary:  ?   Effort: Pulmonary effort is normal. No respiratory distress.  ?   Breath sounds: Normal breath sounds. No wheezing.  ?Musculoskeletal:  ?   Cervical back: Neck supple.  ?Skin: ?   General: Skin is warm and dry.  ?Neurological:  ?   Mental Status: He is alert. Mental status is at baseline.  ?Psychiatric:     ?   Mood and Affect: Mood normal.  ? ? ? ? ? ?   ?Assessment & Plan:  ? ?Problem List Items Addressed This Visit   ? ?  ? Cardiovascular and Mediastinum  ? Hypertension associated with diabetes ([place]) (Chronic)  ?  BP controlled, however, with hyperkalemia. Will stop losartan and monitor K and kidney function. He will monitor bp at home and update. If elevated, consider starting amlodipine if elevated due to no impact on electrolytes.  ? ?  ?  ?  ? Endocrine  ? Hyperlipidemia associated with type 2 diabetes mellitus ([facility]) (Chronic)  ?  Lab Results  ?Component Value Date  ? LDLCALC 47 09/16/2020  ?At goal, cont crestor 5 mg ? ?  ?  ?  Type 2 diabetes mellitus with other specified complication ([place]) - Primary  ?  Lab Results  ?Component Value Date  ? HGBA1C 6.3 (A) 06/17/2021  ?c/b HTN, HLD. Continue healthy diet. Controlled.  ? ?  ?  ? Relevant Orders  ? POCT glycosylated hemoglobin (Hb A1C) (Completed)  ?  ? Other  ? Anemia, unspecified (Chronic)  ?  Pt with worsening fatigue. May be related to SIADH and general chronic conditions, however, does have chronic recurrent anemia. Will reassess and consider iron replacement if needed. He also recently had his levothyroxine dose adjusted which may help.  ? ?  ?  ? Relevant Orders  ? CBC with Differential  ? Ferritin  ? Chronic pain of left knee  ?  Work to decrease the celecoxib to as needed ? ?  ?  ? ? ? ?Return in about 6 months (around 12/17/2021) for wellness visit. ? ?Lesleigh Noe, MD ? ? ? ?

## 2021-06-17 NOTE — Assessment & Plan Note (Signed)
Work to decrease the celecoxib to as needed ?

## 2021-06-24 ENCOUNTER — Ambulatory Visit (INDEPENDENT_AMBULATORY_CARE_PROVIDER_SITE_OTHER): Payer: Medicare Other | Admitting: Podiatry

## 2021-06-24 DIAGNOSIS — S86111A Strain of other muscle(s) and tendon(s) of posterior muscle group at lower leg level, right leg, initial encounter: Secondary | ICD-10-CM

## 2021-06-24 DIAGNOSIS — B351 Tinea unguium: Secondary | ICD-10-CM

## 2021-06-24 NOTE — Progress Notes (Signed)
?  Subjective:  ?Patient ID: Kyle Simon, male    DOB: Aug 03, 1932,  MRN: [id number] ? ?Chief Complaint  ?Patient presents with  ? Foot Pain  ?   BILATERAL ARTHRITIS, AND INJECTIONS  ? Nail Problem  ?   PAINFUL TOENAIL  ? ? ?88 y.o. male presents with the above complaint.  Both ankles are doing okay not particularly painful when he walks on them.  He still has some tenderness in the right calf ? ?Objective:  ?Physical Exam: ?warm, good capillary refill, no trophic changes or ulcerative lesions, normal DP and PT pulses, normal monofilament exam, and normal sensory exam.  Pes planus deformity.  Chronic venous insufficiency with numerous varicose and spider veins.  Still has some tenderness this appears to be centered mostly around the medial head of the gastrocnemius muscle ?Left Foot: dystrophic yellowed discolored nail plates with subungual debris and left chronic ankle swelling, minimal to no pain in the ankle and sinus tarsi ?Right Foot: dystrophic yellowed discolored nail plates with subungual debris ?No swelling in the right side today ? ? ?New radiographs showed no significant change since last visit with degenerative changes in the subtalar and ankle joint ? ?Ultrasound negative for DVT and phlebitis no masses seen ?Assessment:  ? ?1. Strain of gastrocnemius muscle, right, initial encounter   ?2. Pain due to onychomycosis of toenails of both feet   ? ? ? ? ?Plan:  ?Patient was evaluated and treated and all questions answered. ? ?Discussed the etiology and treatment options for the condition in detail with the patient. Educated patient on the topical and oral treatment options for mycotic nails. Recommended debridement of the nails today. Sharp and mechanical debridement performed of all painful and mycotic nails today. Nails debrided in length and thickness using a nail nipper to level of comfort. Discussed treatment options including appropriate shoe gear. Follow up as needed for painful nails. ? ?Patient  educated on diabetes. Discussed proper diabetic foot care and discussed risks and complications of disease. Educated patient in depth on reasons to return to the office immediately should he/she discover anything concerning or new on the feet. All questions answered. Discussed proper shoes as well.  ? ?He still has continued pain on the medial proximal calf.  This mostly seems to be centered around the medial gastrocnemius head.  I gave him therapeutic exercises to work on as well.  Is been persistent for several months and ultrasound was unrevealing for any signs of DVT in the area.  I discussed with him an MRI may give more detail as to the muscle he may have a gastrocnemius tear.  MRI has been ordered for evaluation and I will discuss with him further once we have the results. ? ?Return in about 3 months (around 09/23/2021) for at risk diabetic foot care.  ? ? ?

## 2021-06-24 NOTE — Patient Instructions (Addendum)
Call  [phone] to schedule the MRI  ? ? ? ? [address], [place], [place] [address] ? ? ? ?These exercises may help you when beginning to rehabilitate your injury. Your symptoms may resolve with or without further involvement from your physician, physical therapist or athletic trainer. While completing these exercises, remember:  ?Restoring tissue flexibility helps normal motion to return to the joints. This allows healthier, less painful movement and activity. ?An effective stretch should be held for at least 30 seconds. ?A stretch should never be painful. You should only feel a gentle lengthening or release in the stretched tissue. ? ?STRETCH  Gastroc, Standing  ?Place hands on wall. ?Extend right / left leg, keeping the front knee somewhat bent. ?Slightly point your toes inward on your back foot. ?Keeping your right / left heel on the floor and your knee straight, shift your weight toward the wall, not allowing your back to arch. ?You should feel a gentle stretch in the right / left calf. Hold this position for 10 seconds. ?Repeat 3 times. Complete this stretch 2 times per day. ? ?STRETCH  Soleus, Standing  ?Place hands on wall. ?Extend right / left leg, keeping the other knee somewhat bent. ?Slightly point your toes inward on your back foot. ?Keep your right / left heel on the floor, bend your back knee, and slightly shift your weight over the back leg so that you feel a gentle stretch deep in your back calf. ?Hold this position for 10 seconds. ?Repeat 3 times. Complete this stretch 2 times per day. ? ?Google, Standing  ?Note: This exercise can place a lot of stress on your foot and ankle. Please complete this exercise only if specifically instructed by your caregiver.  ?Place the ball of your right / left foot on a step, keeping your other foot firmly on the same step. ?Hold on to the wall or a rail for balance. ?Slowly lift your other foot, allowing your body weight to  press your heel down over the edge of the step. ?You should feel a stretch in your right / left calf. ?Hold this position for 10 seconds. ?Repeat this exercise with a slight bend in your knee. ?Repeat 3 times. Complete this stretch 2 times per day.  ? ?STRENGTHENING EXERCISES - Achilles Tendinitis ?These exercises may help you when beginning to rehabilitate your injury. They may resolve your symptoms with or without further involvement from your physician, physical therapist or athletic trainer. While completing these exercises, remember:  ?Muscles can gain both the endurance and the strength needed for everyday activities through controlled exercises. ?Complete these exercises as instructed by your physician, physical therapist or athletic trainer. Progress the resistance and repetitions only as guided. ?You may experience muscle soreness or fatigue, but the pain or discomfort you are trying to eliminate should never worsen during these exercises. If this pain does worsen, stop and make certain you are following the directions exactly. If the pain is still present after adjustments, discontinue the exercise until you can discuss the trouble with your clinician. ? ?STRENGTH - Plantar-flexors  ?Sit with your right / left leg extended. Holding onto both ends of a rubber exercise band/tubing, loop it around the ball of your foot. Keep a slight tension in the band. ?Slowly push your toes away from you, pointing them downward. ?Hold this position for 10 seconds. Return slowly, controlling the tension in the band/tubing. ?Repeat 3 times. Complete this exercise 2 times per day.  ? ?  STRENGTH - Plantar-flexors  ?Stand with your feet shoulder width apart. Steady yourself with a wall or table using as little support as needed. ?Keeping your weight evenly spread over the width of your feet, rise up on your toes.* ?Hold this position for 10 seconds. ?Repeat 3 times. Complete this exercise 2 times per day.  ?*If this is too easy,  shift your weight toward your right / left leg until you feel challenged. Ultimately, you may be asked to do this exercise with your right / left foot only. ? ?STRENGTH  Plantar-flexors, Eccentric  ?Note: This exercise can place a lot of stress on your foot and ankle. Please complete this exercise only if specifically instructed by your caregiver.  ?Place the balls of your feet on a step. With your hands, use only enough support from a wall or rail to keep your balance. ?Keep your knees straight and rise up on your toes. ?Slowly shift your weight entirely to your right / left toes and pick up your opposite foot. Gently and with controlled movement, lower your weight through your right / left foot so that your heel drops below the level of the step. You will feel a slight stretch in the back of your calf at the end position. ?Use the healthy leg to help rise up onto the balls of both feet, then lower weight only on the right / left leg again. Build up to 15 repetitions. Then progress to 3 consecutive sets of 15 repetitions.* ?After completing the above exercise, complete the same exercise with a slight knee bend (about 30 degrees). Again, build up to 15 repetitions. Then progress to 3 consecutive sets of 15 repetitions.* ?Perform this exercise 2 times per day.  ?*When you easily complete 3 sets of 15, your physician, physical therapist or athletic trainer may advise you to add resistance by wearing a backpack filled with additional weight. ? ?STRENGTH - Plantar Flexors, Seated  ?Sit on a chair that allows your feet to rest flat on the ground. If necessary, sit at the edge of the chair. ?Keeping your toes firmly on the ground, lift your right / left heel as far as you can without increasing any discomfort in your ankle. ?Repeat 3 times. Complete this exercise 2 times a day. ? ?

## 2021-06-27 DIAGNOSIS — Z1152 Encounter for screening for COVID-19: Secondary | ICD-10-CM | POA: Diagnosis not present

## 2021-06-27 DIAGNOSIS — Z20828 Contact with and (suspected) exposure to other viral communicable diseases: Secondary | ICD-10-CM | POA: Diagnosis not present

## 2021-06-29 DIAGNOSIS — Z20822 Contact with and (suspected) exposure to covid-19: Secondary | ICD-10-CM | POA: Diagnosis not present

## 2021-07-03 ENCOUNTER — Ambulatory Visit
Admission: RE | Admit: 2021-07-03 | Discharge: 2021-07-03 | Disposition: A | Discharge status: Home / Self Care | Payer: Medicare Other | Source: Ambulatory Visit | Attending: Podiatry | Admitting: Podiatry

## 2021-07-03 DIAGNOSIS — S86211A Strain of muscle(s) and tendon(s) of anterior muscle group at lower leg level, right leg, initial encounter: Secondary | ICD-10-CM | POA: Diagnosis not present

## 2021-07-03 DIAGNOSIS — S86111A Strain of other muscle(s) and tendon(s) of posterior muscle group at lower leg level, right leg, initial encounter: Secondary | ICD-10-CM

## 2021-07-07 DIAGNOSIS — Z20822 Contact with and (suspected) exposure to covid-19: Secondary | ICD-10-CM | POA: Diagnosis not present

## 2021-07-09 ENCOUNTER — Encounter: Payer: Self-pay | Admitting: Endocrinology

## 2021-07-09 ENCOUNTER — Encounter: Payer: Self-pay | Admitting: Family Medicine

## 2021-07-09 ENCOUNTER — Ambulatory Visit (INDEPENDENT_AMBULATORY_CARE_PROVIDER_SITE_OTHER): Payer: Medicare Other | Admitting: Endocrinology

## 2021-07-09 VITALS — BP 136/72 | HR 61 | Ht 70.0 in | Wt 186.8 lb

## 2021-07-09 DIAGNOSIS — Z8639 Personal history of other endocrine, nutritional and metabolic disease: Secondary | ICD-10-CM

## 2021-07-09 DIAGNOSIS — E871 Hypo-osmolality and hyponatremia: Secondary | ICD-10-CM | POA: Diagnosis not present

## 2021-07-09 DIAGNOSIS — I10 Essential (primary) hypertension: Secondary | ICD-10-CM | POA: Diagnosis not present

## 2021-07-09 LAB — BASIC METABOLIC PANEL
BUN: 14 mg/dL (ref 6–23)
CO2: 28 mEq/L (ref 19–32)
Calcium: 9.2 mg/dL (ref 8.4–10.5)
Chloride: 96 mEq/L (ref 96–112)
Creatinine, Ser: 0.86 mg/dL (ref 0.40–1.50)
GFR: 78.48 mL/min (ref >60.00)
Glucose, Bld: 166 mg/dL — ABNORMAL HIGH (ref 70–99)
Potassium: 4.6 mEq/L (ref 3.5–5.1)
Sodium: 131 mEq/L — ABNORMAL LOW (ref 135–145)

## 2021-07-09 NOTE — Progress Notes (Signed)
Patient ID: Kyle Simon, male   DOB: Dec 10, 1932, 88 y.o.   MRN: [id number] ?       ? ? ? ? ?Reason for Appointment: Endocrinology follow-up visit ? ? ? History of Present Illness:  ? ?HYPONATREMIA: ? ?Background history: ? ?Serum sodium has been low since at least 2011 and has been as low as 125 in the past ?More recently lowest reading 127 ?He has not had any associated symptoms of decreased appetite, nausea or drowsiness ?Not on any thiazide diuretics or SSRI drugs ?Has had negative endocrine work-up for cortisol deficiencies ? ?Has a history of drinking a lot of water partly for his constipation ? ?Recent history: ?His urine osmolality was last high at 551 ?Was also high at 442 on initial evaluation ?Urine sodium was over 90 previously ? ?He was previously told to cut back on his fluid intake especially coffee ?He is usually drinking only 2 cup of coffee  ? ?With a trial of salt tablets his sodium did not improve, it was still down to 126 in 9/21 ?Also was having nausea from taking more than 1 regular tablet daily ? ?Initially sodium was significantly better after starting DEMECLOCYCLINE 150 mg daily in 9/21 ?However this was stopped when his sodium decreased on the treatment down to 126 ? ?Last sodium was 126 ?As before he has some fatigability but not any different ?Because of his persistently low sodium, mild increase in potassium and history of relatively high urine sodium he is on a trial of Florinef 3 days a week ?No recent weight change ? ?However he now says that the did not have any refill on his Florinef and has not taken it for a week even though he was given 15 tablets with 3 refills ? ?Labs pending from today ? ?CT scan of chest did not show any tumor ? ?Lab Results  ?Component Value Date  ? NA 126 (L) 06/15/2021  ? K 5.3 No hemolysis seen (H) 06/15/2021  ? CL 94 (L) 06/15/2021  ? CO2 25 06/15/2021  ? ? ? ?Hypothyroidism was first diagnosed  around 2005 years ago  ? ?At the time of diagnosis  patient was not having symptoms of  fatigue, cold sensitivity, difficulty concentrating, dry skin, weight gain ?Likely had only done routine testing and was told that he was hypothyroid and started him on unknown dose of levothyroxine ? ?He did not start feeling any different when he started taking levothyroxine initially ?Also usually does not feel any different with dosage adjustments that have been made ?Apparently his dose was 150 ?g for some time but this was reduced in 06/2015 when his TSH was low normal ?Since then he has been taking 150 ?g 5 days a week and 125 ?g twice a week ?on-insulin initial consultation he was complaining that since spring time his energy level has been not as good and he has not been able to do as much with activities as he used to  ? ?He was referred here because his TSH is tending to be high but also his free T4 was relatively higher in 7/18 ? ?Recent history: ?On his initial consultation since his TSH was still relatively high and he was taking the equivalent of about 142 ?g of levothyroxine daily he was switched to taking 150 ?g once daily ?Also he was told to not take his levothyroxine at bedtime but take it before breakfast ? ?He is taking his levothyroxine regularly before breakfast every morning ?The dose has  been gradually decreased the last 2 times ? ?More recently has been on 137 mcg levothyroxine daily  ?Has no unusual fatigue ?In the last 3 to 4 weeks he has started taking iron supplement but he takes this at breakfast which is about 10 minutes after his thyroid medication ? ?  ?Patient's weight history is as follows: ? ?Wt Readings from Last 3 Encounters:  ?07/09/21 186 lb 12.8 oz (84.7 kg)  ?06/17/21 187 lb 6 oz (85 kg)  ?06/15/21 188 lb (85.3 kg)  ? ? ?Thyroid function results have been as follows: ? ?Lab Results  ?Component Value Date  ? TSH 0.44 06/15/2021  ? TSH 0.10 (L) 04/21/2021  ? TSH 0.05 (L) 02/16/2021  ? TSH 1.02 10/13/2020  ? FREET4 1.24 06/15/2021  ? FREET4  1.67 (H) 04/21/2021  ? FREET4 1.43 10/13/2020  ? FREET4 1.08 05/02/2020  ? T3FREE 3.2 12/28/2016  ? T3FREE 2.7 10/17/2015  ? ? ?Lab Results  ?Component Value Date  ? TSH 0.44 06/15/2021  ? TSH 0.10 (L) 04/21/2021  ? TSH 0.05 (L) 02/16/2021  ? TSH 1.02 10/13/2020  ? TSH 0.55 06/30/2020  ? TSH 3.51 05/02/2020  ? TSH 1.51 01/28/2020  ? TSH 2.22 11/20/2019  ? TSH 6.58 (H) 09/12/2019  ? ? ? ?Past Medical History:  ?Diagnosis Date  ? Arthritis   ? both hands;Dr.Deveshwar  ? Constipation due to pain medication   ? Diabetes mellitus without complication ([place])   ? Type II. Uses no medication. Pt controls with diet and weight  ? Enlarged prostate   ? Frequent urination at night   ? GERD (gastroesophageal reflux disease)   ? History of noncompliance with medical treatment, presenting hazards to health 11/20/2018  ? Hyperlipidemia associated with type 2 diabetes mellitus ([place]) 04/19/2007  ? Qualifier: Diagnosis of  By: Linna Darner MD, Gwyndolyn Saxon    ? Hypertension   ? Hypothyroidism   ? Polio   ? as a child w/o complications  ? Snoring   ? no sleep apnea  ? Statin declined-  11/20/2018  ? patient understands risks associated with this decision and has been advised to restart by cardiologist as well as myself several times  ? Torn meniscus   ? Transfusion history 2012  ? post TKR  ? ? ?Past Surgical History:  ?Procedure Laterality Date  ? ANTERIOR LAT LUMBAR FUSION Right 11/03/2017  ? Procedure: Right Lumbar three-four Anterolateral lumbar interbody fusion with lateral plate;  Surgeon: Erline Levine, MD;  Location: [facility];  Service: Neurosurgery;  Laterality: Right;  ? BACK SURGERY  2011  ? hemiarthroplasty 01/11/1999 and 6  ? CARDIAC CATHETERIZATION  09/2001  ? negative  ? COLONOSCOPY    ? EYE SURGERY  2003  ? R&L cataract removed & IOL- 2003 & 2007,Dr.Jenkins  ? INGUINAL HERNIA REPAIR  2002  ? right, Dr. Truitt Leep  ? JOINT REPLACEMENT  2012  ? left knee replacement  ? LIPOMA EXCISION    ? back  ? MENISCECTOMY Bilateral   ? Dr. Noemi Chapel  ?  PROSTATE SURGERY  07/2006  ? for urinary urgency, Dr. Risa Grill  ? ROTATOR CUFF REPAIR Right October, 2016  ? sinus & throat surgery-1995  1995  ? TONSILLECTOMY    ? TOTAL KNEE ARTHROPLASTY  01/11/2011  ? Procedure: TOTAL KNEE ARTHROPLASTY;  Surgeon: Rudean Haskell, MD;  Location: [facility];  Service: Orthopedics;  Laterality: Left;  ? TOTAL KNEE ARTHROPLASTY Right 11/22/2016  ? Procedure: TOTAL KNEE ARTHROPLASTY;  Surgeon: Vickey Huger, MD;  Location: [facility];  Service: Orthopedics;  Laterality: Right;  ? [place]  ? Dr. Erik Obey  ? VASECTOMY    ? WRIST SURGERY  1959  ? fracture- right  ? XI ROBOTIC ASSISTED VENTRAL HERNIA N/A 09/07/2020  ? Procedure: XI ROBOTIC ASSISTED DIAGNOSTIC LAPAROSCOPY;  Surgeon: Jules Husbands, MD;  Location: [facility];  Service: General;  Laterality: N/A;  ? ? ?Family History  ?Problem Relation Age of Onset  ? Sudden death Father   ?     accident  ? Coronary artery disease Mother   ? Emphysema Mother   ? Diverticulosis Mother   ? Thyroid disease Mother   ? Asthma Brother   ? Bone cancer Brother   ? Diabetes Paternal Grandmother   ? Sarcoidosis Son   ? Thyroid disease Daughter   ? Anesthesia problems Neg Hx   ? Hypotension Neg Hx   ? Malignant hyperthermia Neg Hx   ? Pseudochol deficiency Neg Hx   ? Colon cancer Neg Hx   ? ? ?Social History:  reports that he quit smoking about 55 years ago. His smoking use included cigarettes. He has a 18.00 pack-year smoking history. He has never used smokeless tobacco. He reports current alcohol use of about 4.0 standard drinks per week. He reports that he does not use drugs. ? ?Allergies:  ?Allergies  ?Allergen Reactions  ? Simvastatin Other (See Comments)  ?  MYALGIAS  ? Other   ?  Other reaction(s): Unknown ?Other reaction(s): Unknown  ? Oxycodone Other (See Comments)  ?  Severe constipation  ? ? ?Allergies as of 07/09/2021   ? ?   Reactions  ? Simvastatin Other (See Comments)  ? MYALGIAS  ? Other   ? Other reaction(s): Unknown ?Other reaction(s): Unknown   ? Oxycodone Other (See Comments)  ? Severe constipation  ? ?  ? ?  ?Medication List  ?  ? ?  ? Accurate as of Jul 09, 2021  9:54 AM. If you have any questions, ask your nurse or doctor.  ?  ?  ? ?  ? ?aceta

## 2021-07-09 NOTE — Patient Instructions (Signed)
Take iron at least 1-2 hrs after Bfst ? ? ?

## 2021-07-10 NOTE — Telephone Encounter (Signed)
Routing to Dr. Dwyane Dee as his note commented on elevated potassium but patient has already stopped losartan.  ? ?Will ask him to decrease celebrex if able but appreciate any other suggestions.  ?

## 2021-07-13 ENCOUNTER — Encounter: Payer: Self-pay | Admitting: Endocrinology

## 2021-07-17 IMAGING — DX DG ABD PORTABLE 1V
1 series · 1 of 1 positions shown · non-contrast
Comparison: None.

CLINICAL DATA: Check gastric catheter placement

EXAM:
PORTABLE ABDOMEN - 1 VIEW

[abdomen supine]
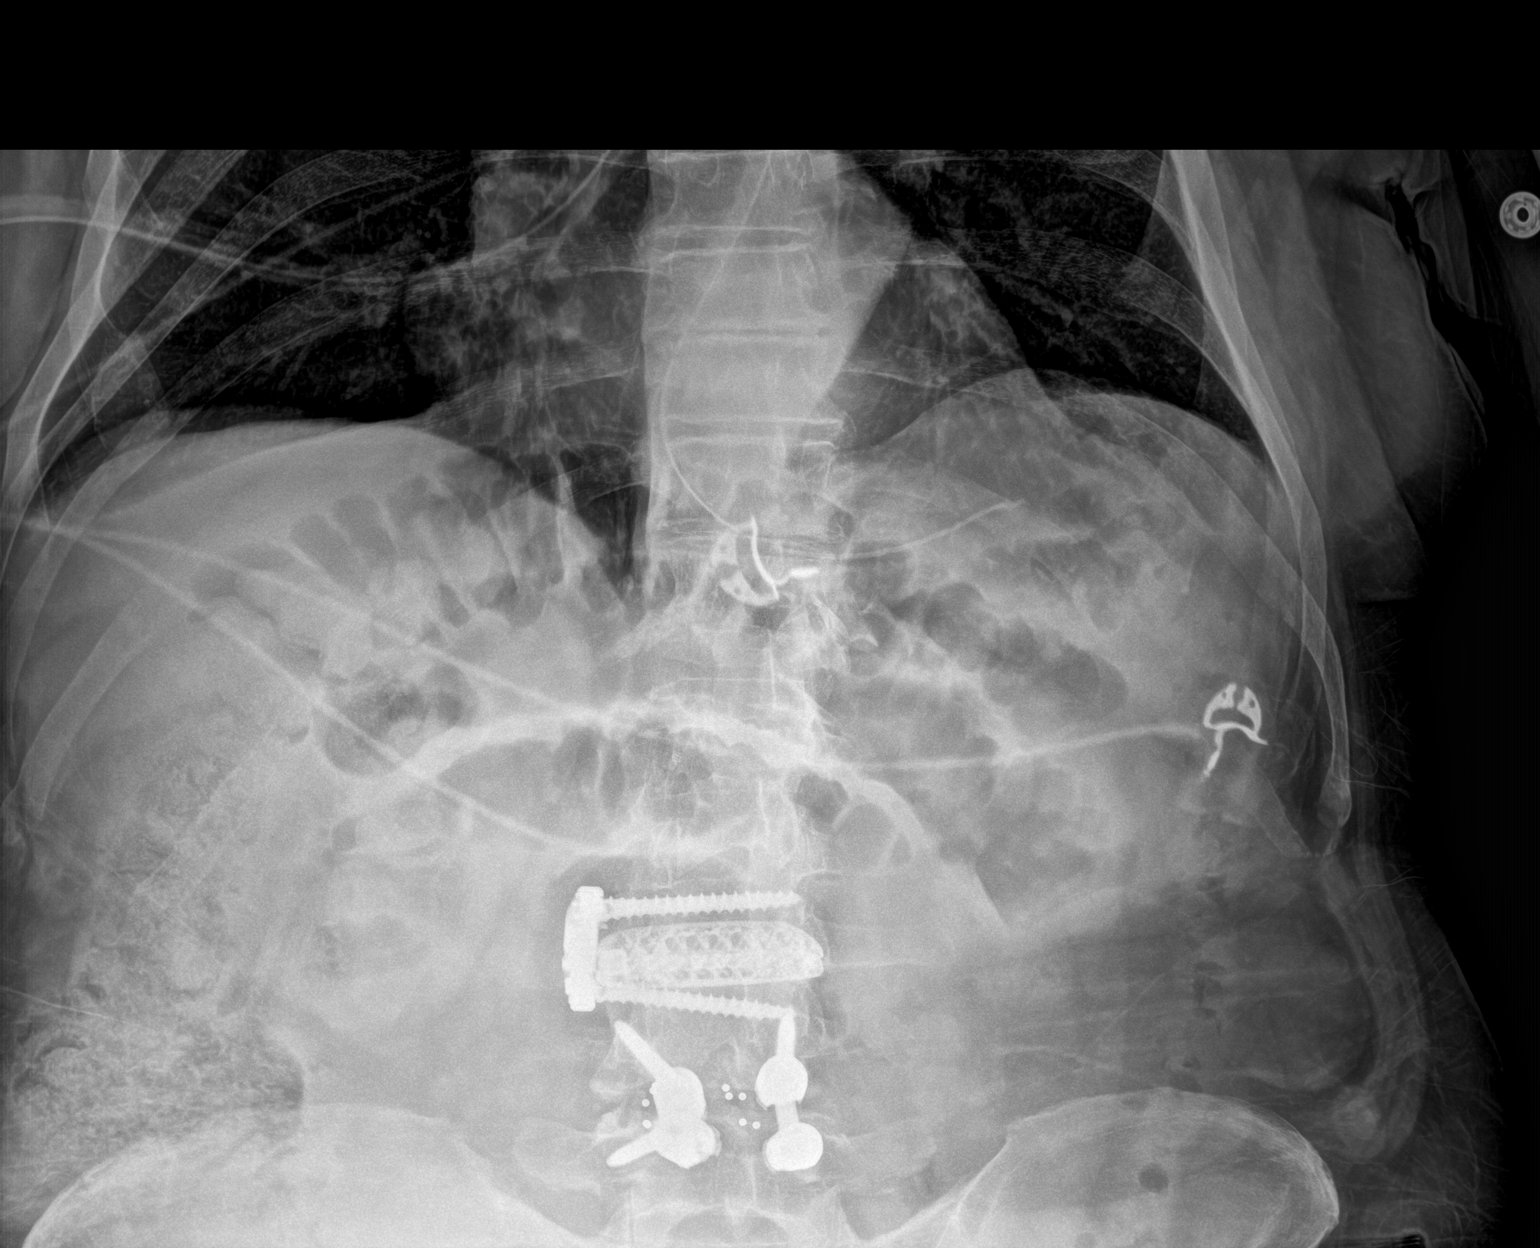

[1 of 1 positions shown; findings below may reference images not displayed]

FINDINGS: Gastric catheter is noted within the stomach. Proximal side port
appears within the distal esophagus. This should be advanced several
cm deeper into the stomach. Some mildly dilated small bowel loops
are again identified. Postsurgical changes in the lumbar spine are
seen.
IMPRESSION: Gastric catheter as described. This should be advanced further into
the stomach.

Persistent small bowel dilatation is noted.

## 2021-07-19 IMAGING — CR DG ABDOMEN 1V
1 series · 2 of 2 positions shown · non-contrast
Comparison: 09/07/2020

CLINICAL DATA: Small bowel obstruction

EXAM:
ABDOMEN - 1 VIEW

[Series 1: dg abd 1 view · 0.14mm/px · 2 of 2 slices shown]
[im 1/2]
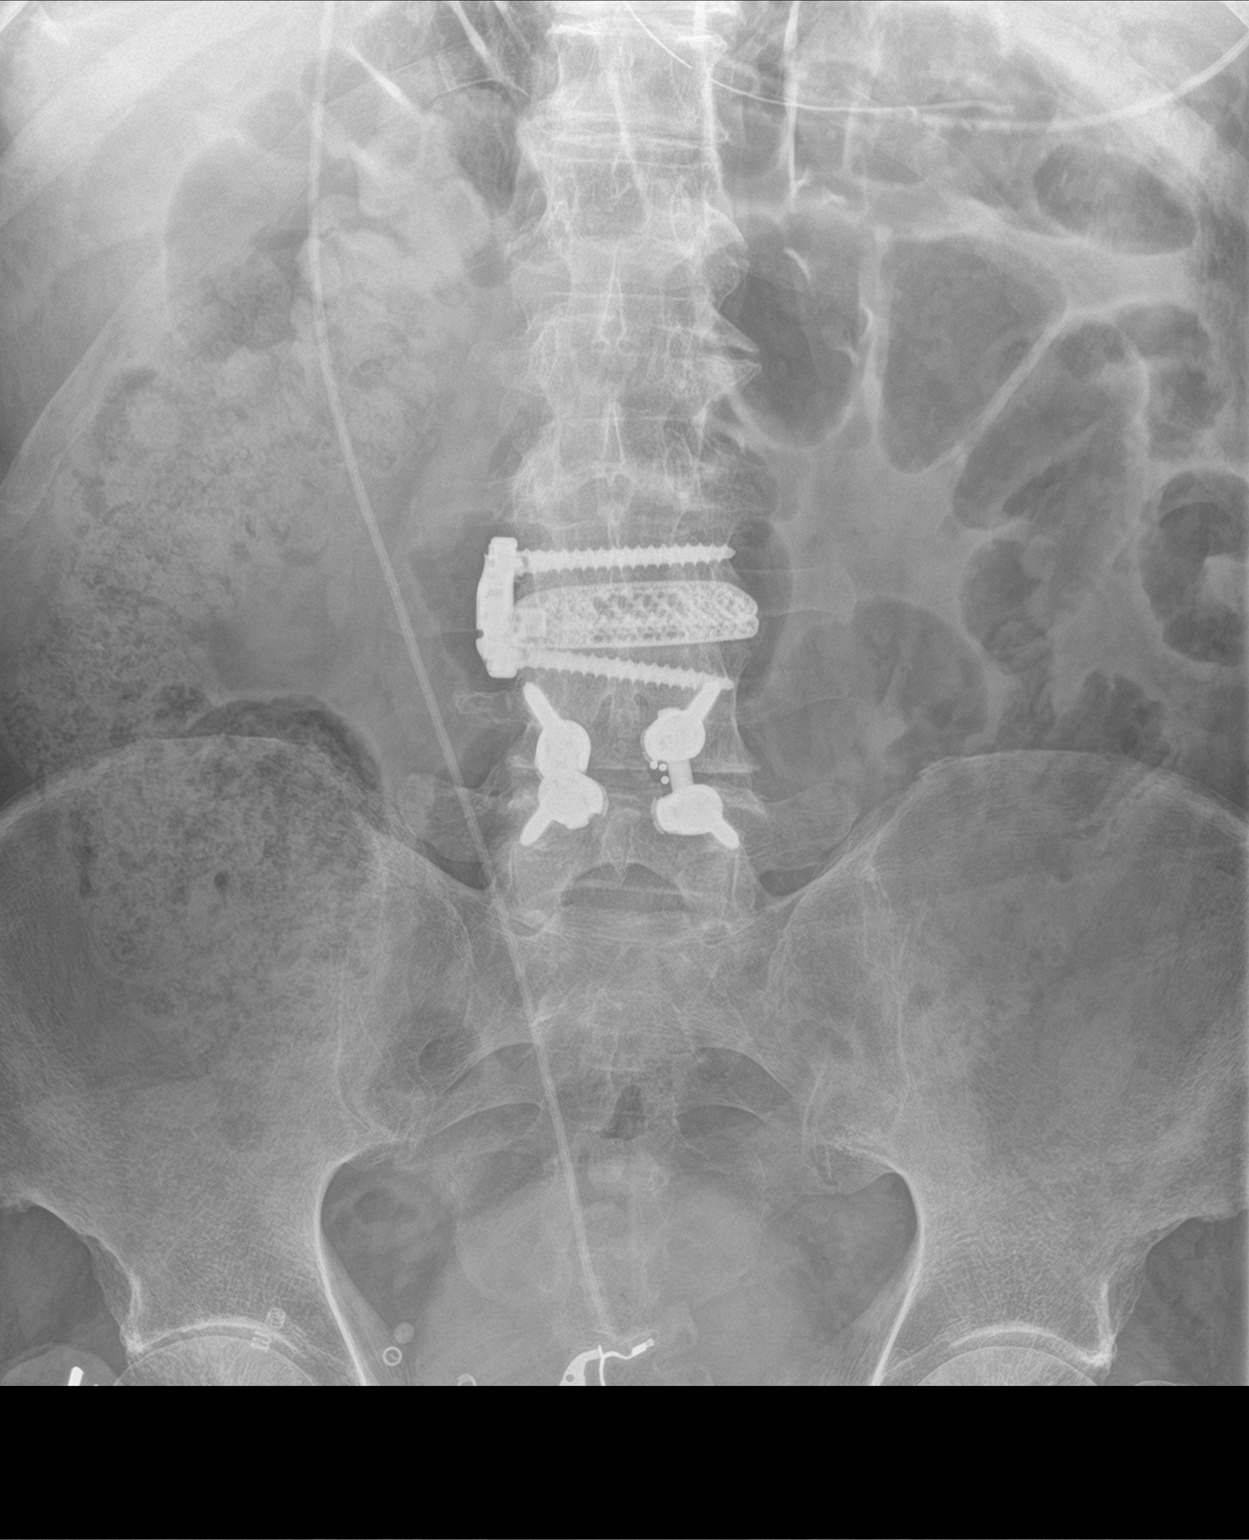
[im 2/2]
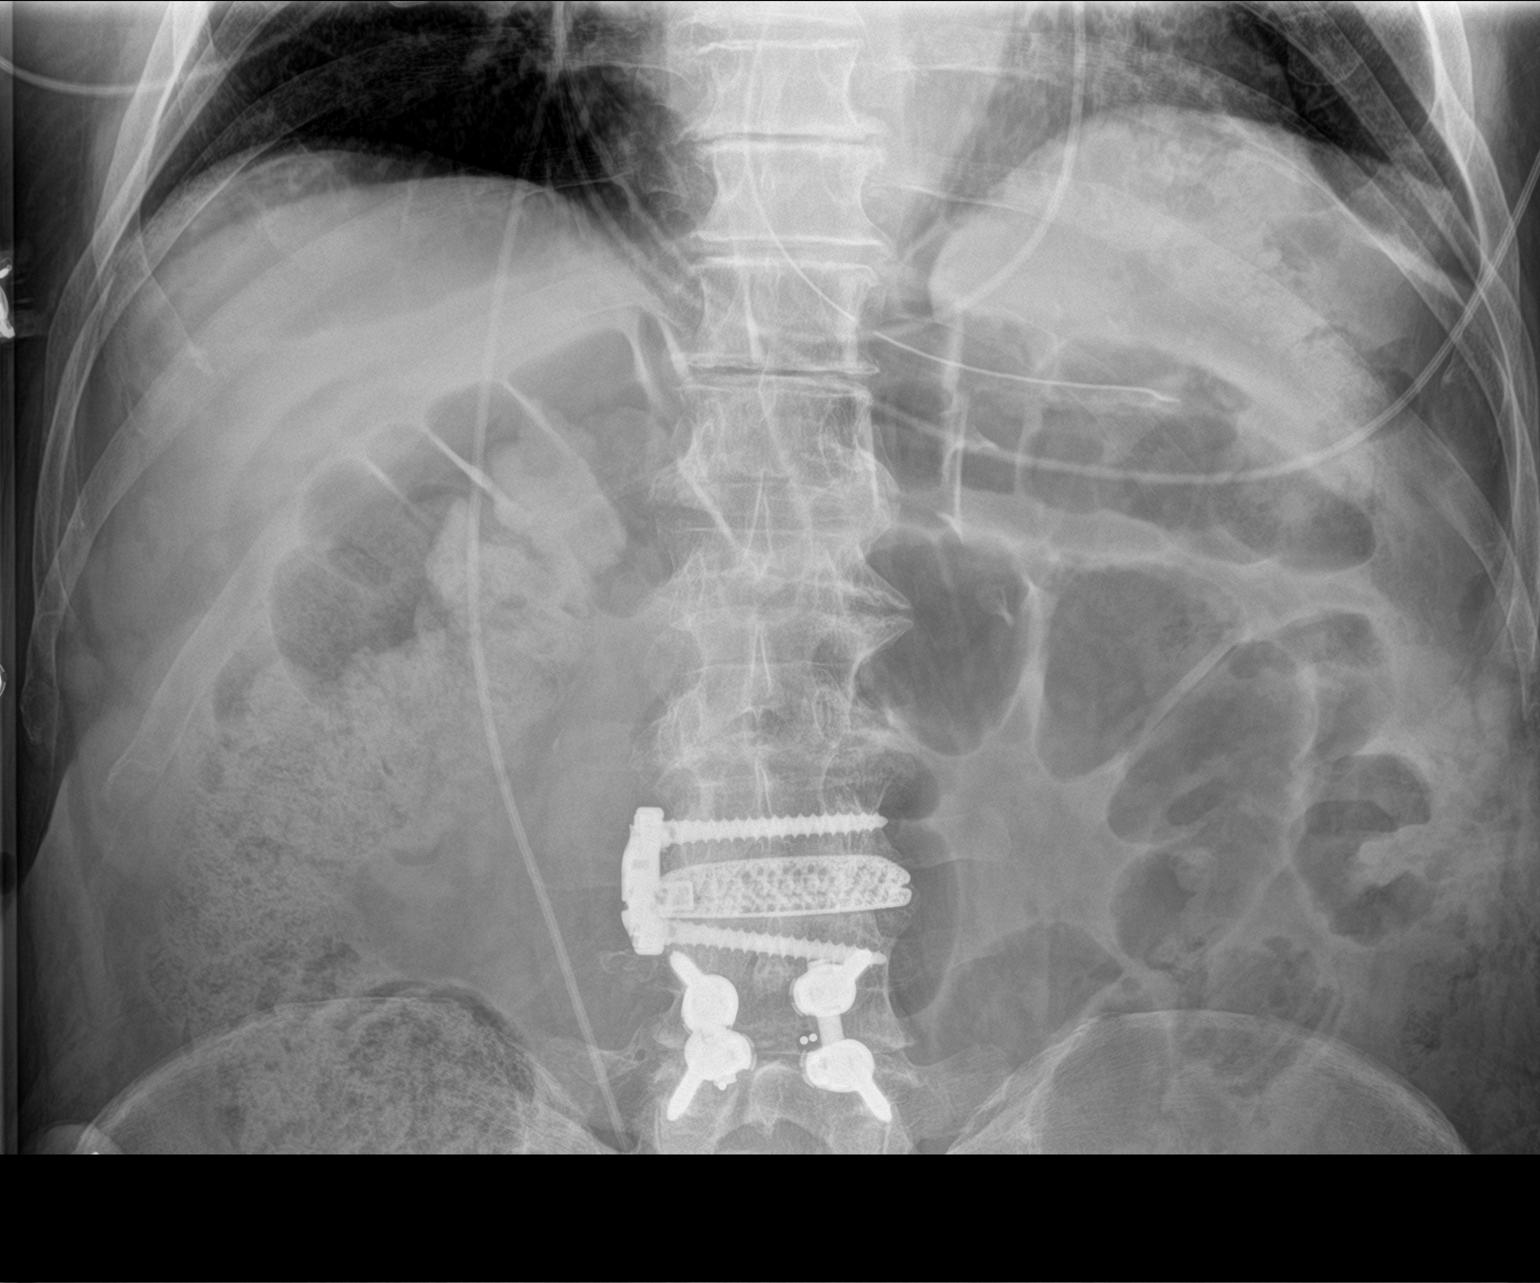

[2 of 2 positions shown; findings below may reference images not displayed]

FINDINGS: NG tube is in the stomach. Stool and gas noted throughout the colon.
Decreasing small bowel distention since prior study. No free air or
organomegaly.
IMPRESSION: Decreasing small bowel distention compatible with improving
small-bowel obstruction. Gas and moderate stool throughout the
colon.

## 2021-07-31 ENCOUNTER — Encounter: Payer: Self-pay | Admitting: Family Medicine

## 2021-07-31 ENCOUNTER — Ambulatory Visit (INDEPENDENT_AMBULATORY_CARE_PROVIDER_SITE_OTHER): Payer: Medicare Other | Admitting: Family Medicine

## 2021-07-31 VITALS — BP 120/68 | HR 70 | Temp 97.6°F | Ht 70.0 in | Wt 186.0 lb

## 2021-07-31 DIAGNOSIS — E222 Syndrome of inappropriate secretion of antidiuretic hormone: Secondary | ICD-10-CM | POA: Diagnosis not present

## 2021-07-31 DIAGNOSIS — I152 Hypertension secondary to endocrine disorders: Secondary | ICD-10-CM

## 2021-07-31 DIAGNOSIS — E785 Hyperlipidemia, unspecified: Secondary | ICD-10-CM

## 2021-07-31 DIAGNOSIS — E1159 Type 2 diabetes mellitus with other circulatory complications: Secondary | ICD-10-CM | POA: Diagnosis not present

## 2021-07-31 DIAGNOSIS — Z96653 Presence of artificial knee joint, bilateral: Secondary | ICD-10-CM

## 2021-07-31 DIAGNOSIS — R2689 Other abnormalities of gait and mobility: Secondary | ICD-10-CM

## 2021-07-31 DIAGNOSIS — E1169 Type 2 diabetes mellitus with other specified complication: Secondary | ICD-10-CM

## 2021-07-31 MED ORDER — ROSUVASTATIN CALCIUM 5 MG PO TABS: 5.0000 mg | ORAL_TABLET | Freq: Every day | ORAL | 3 refills | Status: DC

## 2021-07-31 NOTE — Assessment & Plan Note (Signed)
Controlled off losartan. Some elevated readings at home. Update if worsening.

## 2021-07-31 NOTE — Assessment & Plan Note (Signed)
Complicated by b/l knee pain s/p replacement. Tried PT but had a bad experience. Discussed focusing on balance and strength and not knee pain. He is willing to try again. Has become less stable. Appreciate PT support.

## 2021-07-31 NOTE — Assessment & Plan Note (Signed)
He continues to have knee pain. Discussed increasing celebrex back to twice daily. He will get labs with endo in a few weeks - if K elevated, will need to decrease again. Discussed risks with daily use.

## 2021-07-31 NOTE — Assessment & Plan Note (Signed)
Lab Results  Component Value Date   LDLCALC 47 09/16/2020  Controlled. Cont crestor 5 mg

## 2021-07-31 NOTE — Assessment & Plan Note (Signed)
Improving on last check. Taking fludrocortisone and following with endo. Appreciate endo support. Remaining off losartan 2/2 to hyperkalemia

## 2021-07-31 NOTE — Progress Notes (Signed)
Subjective:     Kyle Simon is a 88 y.o. male presenting for Follow-up (Meds and BP ), Gastroesophageal Reflux (Off and on the last month ), and Leg Pain (Right calf pain. Has already had [place] and MRI. Dx muscle strain)     HPI   #HTN - has been checking at home - sits for a while - 106-165/60-70s - is in more pain  #knee pain - only taking one celebrex - would like to increase to twice daily  #Acid reflux - was bad a few weeks ago - but this got better with tums - is not taking protonix - no longer an issue  #Leg pain - seeing podiatry - negative DVT and MRI with muscle strain - does not want physical therapy - pain to the touch - using the cane for walking  - no pain in the leg  Review of Systems   Social History   Tobacco Use  Smoking Status Former   Packs/day: 1.00   Years: 18.00   Pack years: 18.00   Types: Cigarettes   Quit date: 03/01/1966   Years since quitting: 55.4  Smokeless Tobacco Never        Objective:    BP Readings from Last 3 Encounters:  07/31/21 120/68  07/09/21 136/72  06/17/21 108/60   Wt Readings from Last 3 Encounters:  07/31/21 186 lb (84.4 kg)  07/09/21 186 lb 12.8 oz (84.7 kg)  06/17/21 187 lb 6 oz (85 kg)    BP 120/68   Pulse 70   Temp 97.6 F (36.4 C) (Temporal)   Ht '5\' 10"'$  (1.778 m)   Wt 186 lb (84.4 kg)   SpO2 100%   BMI 26.69 kg/m    Physical Exam Constitutional:      Appearance: Normal appearance. He is not ill-appearing or diaphoretic.  HENT:     Right Ear: External ear normal.     Left Ear: External ear normal.     Nose: Nose normal.  Eyes:     General: No scleral icterus.    Extraocular Movements: Extraocular movements intact.     Conjunctiva/sclera: Conjunctivae normal.  Cardiovascular:     Rate and Rhythm: Normal rate and regular rhythm.     Heart sounds: Murmur heard.  Pulmonary:     Effort: Pulmonary effort is normal. No respiratory distress.     Breath sounds: Normal breath sounds.  No wheezing.  Musculoskeletal:     Cervical back: Neck supple.     Comments: Ambulates with cane, slowly moving with support of wife  Skin:    General: Skin is warm and dry.  Neurological:     Mental Status: He is alert. Mental status is at baseline.  Psychiatric:        Mood and Affect: Mood normal.          Assessment & Plan:   Problem List Items Addressed This Visit       Cardiovascular and Mediastinum   Hypertension associated with diabetes ([place]) - Primary (Chronic)    Controlled off losartan. Some elevated readings at home. Update if worsening.        Relevant Medications   rosuvastatin (CRESTOR) 5 MG tablet     Endocrine   Hyperlipidemia associated with type 2 diabetes mellitus ([place]) (Chronic)    Lab Results  Component Value Date   LDLCALC 47 09/16/2020  Controlled. Cont crestor 5 mg       Relevant Medications   rosuvastatin (CRESTOR) 5 MG  tablet   SIADH (syndrome of inappropriate ADH production) ([place])    Improving on last check. Taking fludrocortisone and following with endo. Appreciate endo support. Remaining off losartan 2/2 to hyperkalemia         Other   H/O total knee replacement, bilateral    He continues to have knee pain. Discussed increasing celebrex back to twice daily. He will get labs with endo in a few weeks - if K elevated, will need to decrease again. Discussed risks with daily use.        Balance problems    Complicated by b/l knee pain s/p replacement. Tried PT but had a bad experience. Discussed focusing on balance and strength and not knee pain. He is willing to try again. Has become less stable. Appreciate PT support.          Return in about 4 months (around 11/30/2021) for as planned.  Lesleigh Noe, MD

## 2021-07-31 NOTE — Patient Instructions (Signed)
#  Blood pressure - Looks overall good - Call if consistently >150/90 - Will consider starting Amlodipine   #Reflux - if this recurs let me know - we can try a medicine like Omeprazole  #Leg pain - continue to monitor and update if worsening  #Knee pain - ok to increase Celebrex - Update if worsening blood pressure

## 2021-08-17 ENCOUNTER — Other Ambulatory Visit: Payer: Self-pay

## 2021-08-17 DIAGNOSIS — I10 Essential (primary) hypertension: Secondary | ICD-10-CM

## 2021-08-17 DIAGNOSIS — E871 Hypo-osmolality and hyponatremia: Secondary | ICD-10-CM

## 2021-08-17 MED ORDER — FLUDROCORTISONE ACETATE 0.1 MG PO TABS: ORAL_TABLET | ORAL | 3 refills | Status: DC

## 2021-08-20 ENCOUNTER — Encounter: Payer: Self-pay | Admitting: Endocrinology

## 2021-08-20 ENCOUNTER — Ambulatory Visit (INDEPENDENT_AMBULATORY_CARE_PROVIDER_SITE_OTHER): Payer: Medicare Other | Admitting: Endocrinology

## 2021-08-20 VITALS — BP 106/66 | HR 64 | Ht 70.0 in | Wt 188.4 lb

## 2021-08-20 DIAGNOSIS — E063 Autoimmune thyroiditis: Secondary | ICD-10-CM | POA: Diagnosis not present

## 2021-08-20 DIAGNOSIS — D508 Other iron deficiency anemias: Secondary | ICD-10-CM | POA: Diagnosis not present

## 2021-08-20 DIAGNOSIS — E871 Hypo-osmolality and hyponatremia: Secondary | ICD-10-CM | POA: Diagnosis not present

## 2021-08-20 LAB — BASIC METABOLIC PANEL
BUN: 16 mg/dL (ref 6–23)
CO2: 27 mEq/L (ref 19–32)
Calcium: 9.3 mg/dL (ref 8.4–10.5)
Chloride: 97 mEq/L (ref 96–112)
Creatinine, Ser: 0.9 mg/dL (ref 0.40–1.50)
GFR: 77.35 mL/min (ref >60.00)
Glucose, Bld: 120 mg/dL — ABNORMAL HIGH (ref 70–99)
Potassium: 4.7 mEq/L (ref 3.5–5.1)
Sodium: 129 mEq/L — ABNORMAL LOW (ref 135–145)

## 2021-08-20 LAB — CBC WITH DIFFERENTIAL/PLATELET
Basophils Absolute: 0 10*3/uL (ref 0.0–0.1)
Basophils Relative: 0.4 % (ref 0.0–3.0)
Eosinophils Absolute: 0.1 10*3/uL (ref 0.0–0.7)
Eosinophils Relative: 1.9 % (ref 0.0–5.0)
HCT: 36 % — ABNORMAL LOW (ref 39.0–52.0)
Hemoglobin: 12 g/dL — ABNORMAL LOW (ref 13.0–17.0)
Lymphocytes Relative: 20.8 % (ref 12.0–46.0)
Lymphs Abs: 1.3 10*3/uL (ref 0.7–4.0)
MCHC: 33.2 g/dL (ref 30.0–36.0)
MCV: 91.7 fl (ref 78.0–100.0)
Monocytes Absolute: 0.5 10*3/uL (ref 0.1–1.0)
Monocytes Relative: 8.5 % (ref 3.0–12.0)
Neutro Abs: 4.4 10*3/uL (ref 1.4–7.7)
Neutrophils Relative %: 68.4 % (ref 43.0–77.0)
Platelets: 251 10*3/uL (ref 150.0–400.0)
RBC: 3.93 Mil/uL — ABNORMAL LOW (ref 4.22–5.81)
RDW: 13.2 % (ref 11.5–15.5)
WBC: 6.4 10*3/uL (ref 4.0–10.5)

## 2021-08-20 LAB — TSH: TSH: 0.61 u[IU]/mL (ref 0.35–5.50)

## 2021-08-20 LAB — T4, FREE: Free T4: 1.6 ng/dL (ref 0.60–1.60)

## 2021-08-20 MED ORDER — FLUDROCORTISONE ACETATE 0.1 MG PO TABS: ORAL_TABLET | ORAL | 3 refills | Status: DC

## 2021-08-20 NOTE — Patient Instructions (Signed)
Stop LOsartan and restart Florinef

## 2021-09-07 ENCOUNTER — Ambulatory Visit (INDEPENDENT_AMBULATORY_CARE_PROVIDER_SITE_OTHER): Payer: Medicare Other

## 2021-09-07 VITALS — Wt 188.0 lb

## 2021-09-07 DIAGNOSIS — Z0000 Encounter for general adult medical examination without abnormal findings: Secondary | ICD-10-CM

## 2021-09-07 NOTE — Patient Instructions (Signed)
Kyle Simon , Thank you for taking time to come for your Medicare Wellness Visit. I appreciate your ongoing commitment to your health goals. Please review the following plan we discussed and let me know if I can assist you in the future.   Screening recommendations/referrals: Colonoscopy: aged out Recommended yearly ophthalmology/optometry visit for glaucoma screening and checkup Recommended yearly dental visit for hygiene and checkup  Vaccinations: Influenza vaccine: n/d Pneumococcal vaccine: 03/20/14 Tdap vaccine: 07/24/18 Shingles vaccine: Shingrix 07/24/18, 11/24/18   Covid-19: 03/11/19, 04/01/19, 11/07/19, 06/18/20, 12/31/20  Advanced directives: no  Conditions/risks identified: none  Next appointment: Follow up in one year for your annual wellness visit. 09/09/22 @ 8:45 am by phone  Preventive Care 65 Years and Older, Male Preventive care refers to lifestyle choices and visits with your health care provider that can promote health and wellness. What does preventive care include? A yearly physical exam. This is also called an annual well check. Dental exams once or twice a year. Routine eye exams. Ask your health care provider how often you should have your eyes checked. Personal lifestyle choices, including: Daily care of your teeth and gums. Regular physical activity. Eating a healthy diet. Avoiding tobacco and drug use. Limiting alcohol use. Practicing safe sex. Taking low doses of aspirin every day. Taking vitamin and mineral supplements as recommended by your health care provider. What happens during an annual well check? The services and screenings done by your health care provider during your annual well check will depend on your age, overall health, lifestyle risk factors, and family history of disease. Counseling  Your health care provider may ask you questions about your: Alcohol use. Tobacco use. Drug use. Emotional well-being. Home and relationship well-being. Sexual  activity. Eating habits. History of falls. Memory and ability to understand (cognition). Work and work Statistician. Screening  You may have the following tests or measurements: Height, weight, and BMI. Blood pressure. Lipid and cholesterol levels. These may be checked every 5 years, or more frequently if you are over 63 years old. Skin check. Lung cancer screening. You may have this screening every year starting at age 79 if you have a 30-pack-year history of smoking and currently smoke or have quit within the past 15 years. Fecal occult blood test (FOBT) of the stool. You may have this test every year starting at age 72. Flexible sigmoidoscopy or colonoscopy. You may have a sigmoidoscopy every 5 years or a colonoscopy every 10 years starting at age 12. Prostate cancer screening. Recommendations will vary depending on your family history and other risks. Hepatitis C blood test. Hepatitis B blood test. Sexually transmitted disease (STD) testing. Diabetes screening. This is done by checking your blood sugar (glucose) after you have not eaten for a while (fasting). You may have this done every 1-3 years. Abdominal aortic aneurysm (AAA) screening. You may need this if you are a current or former smoker. Osteoporosis. You may be screened starting at age 6 if you are at high risk. Talk with your health care provider about your test results, treatment options, and if necessary, the need for more tests. Vaccines  Your health care provider may recommend certain vaccines, such as: Influenza vaccine. This is recommended every year. Tetanus, diphtheria, and acellular pertussis (Tdap, Td) vaccine. You may need a Td booster every 10 years. Zoster vaccine. You may need this after age 60. Pneumococcal 13-valent conjugate (PCV13) vaccine. One dose is recommended after age 29. Pneumococcal polysaccharide (PPSV23) vaccine. One dose is recommended after age 69.  Talk to your health care provider about which  screenings and vaccines you need and how often you need them. This information is not intended to replace advice given to you by your health care provider. Make sure you discuss any questions you have with your health care provider. Document Released: 03/14/2015 Document Revised: 11/05/2015 Document Reviewed: 12/17/2014 Elsevier Interactive Patient Education  2017 [place] Prevention in the Home Falls can cause injuries. They can happen to people of all ages. There are many things you can do to make your home safe and to help prevent falls. What can I do on the outside of my home? Regularly fix the edges of walkways and driveways and fix any cracks. Remove anything that might make you trip as you walk through a door, such as a raised step or threshold. Trim any bushes or trees on the path to your home. Use bright outdoor lighting. Clear any walking paths of anything that might make someone trip, such as rocks or tools. Regularly check to see if handrails are loose or broken. Make sure that both sides of any steps have handrails. Any raised decks and porches should have guardrails on the edges. Have any leaves, snow, or ice cleared regularly. Use sand or salt on walking paths during winter. Clean up any spills in your garage right away. This includes oil or grease spills. What can I do in the bathroom? Use night lights. Install grab bars by the toilet and in the tub and shower. Do not use towel bars as grab bars. Use non-skid mats or decals in the tub or shower. If you need to sit down in the shower, use a plastic, non-slip stool. Keep the floor dry. Clean up any water that spills on the floor as soon as it happens. Remove soap buildup in the tub or shower regularly. Attach bath mats securely with double-sided non-slip rug tape. Do not have throw rugs and other things on the floor that can make you trip. What can I do in the bedroom? Use night lights. Make sure that you have a  light by your bed that is easy to reach. Do not use any sheets or blankets that are too big for your bed. They should not hang down onto the floor. Have a firm chair that has side arms. You can use this for support while you get dressed. Do not have throw rugs and other things on the floor that can make you trip. What can I do in the kitchen? Clean up any spills right away. Avoid walking on wet floors. Keep items that you use a lot in easy-to-reach places. If you need to reach something above you, use a strong step stool that has a grab bar. Keep electrical cords out of the way. Do not use floor polish or wax that makes floors slippery. If you must use wax, use non-skid floor wax. Do not have throw rugs and other things on the floor that can make you trip. What can I do with my stairs? Do not leave any items on the stairs. Make sure that there are handrails on both sides of the stairs and use them. Fix handrails that are broken or loose. Make sure that handrails are as long as the stairways. Check any carpeting to make sure that it is firmly attached to the stairs. Fix any carpet that is loose or worn. Avoid having throw rugs at the top or bottom of the stairs. If you do have throw  rugs, attach them to the floor with carpet tape. Make sure that you have a light switch at the top of the stairs and the bottom of the stairs. If you do not have them, ask someone to add them for you. What else can I do to help prevent falls? Wear shoes that: Do not have high heels. Have rubber bottoms. Are comfortable and fit you well. Are closed at the toe. Do not wear sandals. If you use a stepladder: Make sure that it is fully opened. Do not climb a closed stepladder. Make sure that both sides of the stepladder are locked into place. Ask someone to hold it for you, if possible. Clearly mark and make sure that you can see: Any grab bars or handrails. First and last steps. Where the edge of each step  is. Use tools that help you move around (mobility aids) if they are needed. These include: Canes. Walkers. Scooters. Crutches. Turn on the lights when you go into a dark area. Replace any light bulbs as soon as they burn out. Set up your furniture so you have a clear path. Avoid moving your furniture around. If any of your floors are uneven, fix them. If there are any pets around you, be aware of where they are. Review your medicines with your doctor. Some medicines can make you feel dizzy. This can increase your chance of falling. Ask your doctor what other things that you can do to help prevent falls. This information is not intended to replace advice given to you by your health care provider. Make sure you discuss any questions you have with your health care provider. Document Released: 12/12/2008 Document Revised: 07/24/2015 Document Reviewed: 03/22/2014 Elsevier Interactive Patient Education  2017 [employer].

## 2021-09-07 NOTE — Progress Notes (Signed)
Virtual Visit via Telephone Note  I connected with  Timmie Foerster on 09/07/21 at  8:45 AM EDT by telephone and verified that I am speaking with the correct person using two identifiers.  Location: Patient: home Provider: [place] Persons participating in the virtual visit: [place]   I discussed the limitations, risks, security and privacy concerns of performing an evaluation and management service by telephone and the availability of in person appointments. The patient expressed understanding and agreed to proceed.  Interactive audio and video telecommunications were attempted between this nurse and patient, however failed, due to patient having technical difficulties OR patient did not have access to video capability.  We continued and completed visit with audio only.  Some vital signs may be absent or patient reported.   Dionisio David, LPN  Subjective:   KATHRYN LINAREZ is a 88 y.o. male who presents for Medicare Annual/Subsequent preventive examination.  Review of Systems     Cardiac Risk Factors include: advanced age (>53mn, >>56women);diabetes mellitus;hypertension;male gender;dyslipidemia     Objective:    There were no vitals filed for this visit. There is no height or weight on file to calculate BMI.     09/07/2021    8:56 AM 09/06/2020    1:43 PM 09/06/2020    4:31 AM 09/04/2020    8:30 AM 11/03/2017    6:44 AM 10/25/2017    9:20 AM 11/16/2016    9:00 AM  Advanced Directives  Does Patient Have a Medical Advance Directive? No _0  Yes  Type of ACorporate treasurerof [place] will;Out of facility DNR (pink MOST or yellow form) [place] will;Out of facility DNR (pink MOST or yellow form) [place] RockLiving will  [place] will Living will  Does patient want to make changes to medical advance directive?  No - Patient declined    No - Patient  declined No - Patient declined  Copy of [place] Chart?  No - copy requested  No - copy requested  Yes   Would patient like information on creating a medical advance directive? No - Patient declined          Current Medications (verified) Outpatient Encounter Medications as of 09/07/2021  Medication Sig   acetaminophen (TYLENOL) 500 MG tablet Take 1,000 mg by mouth every 6 (six) hours as needed for mild pain. Takes 2 tablets nightly   aspirin 81 MG tablet Take 81 mg by mouth every evening.    Calcium Carb-Cholecalciferol (CALCIUM 600 + D PO) Take 1 tablet by mouth daily.   celecoxib (CELEBREX) 200 MG capsule Take 200 mg by mouth 2 (two) times daily.   CLOBETASOL PROPIONATE E 0.05 % emollient cream as needed.   diclofenac Sodium (VOLTAREN) 1 % GEL Apply 2 g topically at bedtime.   Ferrous Sulfate (IRON) 28 MG TABS Take 1 each by mouth daily.   finasteride (PROSCAR) 5 MG tablet Take 5 mg by mouth daily.   fludrocortisone (FLORINEF) 0.1 MG tablet 1 tablet 3 days a week   Hypromellose (ARTIFICIAL TEARS OP) Place 1 drop into both eyes 2 (two) times daily.   levothyroxine (SYNTHROID) 137 MCG tablet Take 1 tablet (137 mcg total) by mouth daily before breakfast.   Multiple Vitamin (MULTIVITAMIN WITH MINERALS) TABS Take 1 tablet by mouth daily.   NONFORMULARY OR COMPOUNDED ITEM Glucometer (US AirwaysXTRA- Model #[id number]   Omega-3  Fatty Acids (FISH OIL) 1000 MG CAPS Take 1,000 mg by mouth every evening.   polyethylene glycol (MIRALAX) 17 g packet Take 17 g by mouth daily.   Probiotic Product (RESTORA) CAPS One tab qd   rosuvastatin (CRESTOR) 5 MG tablet Take 1 tablet (5 mg total) by mouth at bedtime.   tamsulosin (FLOMAX) 0.4 MG CAPS capsule Take 0.4 mg by mouth 2 (two) times daily.   vitamin B-12 (CYANOCOBALAMIN) 1000 MCG tablet Take 1,000 mcg by mouth daily.   No facility-administered encounter medications on file as of 09/07/2021.    Allergies  (verified) Simvastatin, Other, and Oxycodone   History: Past Medical History:  Diagnosis Date   Arthritis    both hands;Dr.Deveshwar   Constipation due to pain medication    Diabetes mellitus without complication (HCC)    Type II. Uses no medication. Pt controls with diet and weight   Enlarged prostate    Frequent urination at night    GERD (gastroesophageal reflux disease)    History of noncompliance with medical treatment, presenting hazards to health 11/20/2018   Hyperlipidemia associated with type 2 diabetes mellitus ([place]) 04/19/2007   Qualifier: Diagnosis of  By: Linna Darner MD, William     Hypertension    Hypothyroidism    Polio    as a child w/o complications   Snoring    no sleep apnea   Statin declined-  11/20/2018   patient understands risks associated with this decision and has been advised to restart by cardiologist as well as myself several times   Torn meniscus    Transfusion history 2012   post TKR   Past Surgical History:  Procedure Laterality Date   ANTERIOR LAT LUMBAR FUSION Right 11/03/2017   Procedure: Right Lumbar three-four Anterolateral lumbar interbody fusion with lateral plate;  Surgeon: Erline Levine, MD;  Location: [facility];  Service: Neurosurgery;  Laterality: Right;   BACK SURGERY  2011   hemiarthroplasty 01/11/1999 and 6   CARDIAC CATHETERIZATION  09/2001   negative   COLONOSCOPY     EYE SURGERY  2003   R&L cataract removed & IOL- 2003 & 2007,Dr.Jenkins   INGUINAL HERNIA REPAIR  2002   right, Dr. Truitt Leep   JOINT REPLACEMENT  2012   left knee replacement   LIPOMA EXCISION     back   MENISCECTOMY Bilateral    Dr. Noemi Chapel   PROSTATE SURGERY  07/2006   for urinary urgency, Dr. Everardo Pacific CUFF REPAIR Right October, 2016   sinus & throat surgery-1995  1995   TONSILLECTOMY     TOTAL KNEE ARTHROPLASTY  01/11/2011   Procedure: TOTAL KNEE ARTHROPLASTY;  Surgeon: Rudean Haskell, MD;  Location: [facility];  Service: Orthopedics;  Laterality: Left;    TOTAL KNEE ARTHROPLASTY Right 11/22/2016   Procedure: TOTAL KNEE ARTHROPLASTY;  Surgeon: Vickey Huger, MD;  Location: [facility];  Service: Orthopedics;  Laterality: Right;   UVULECTOMY  [id number]   Dr. Erik Obey   [place]   fracture- right   XI ROBOTIC ASSISTED VENTRAL HERNIA N/A 09/07/2020   Procedure: XI ROBOTIC ASSISTED DIAGNOSTIC LAPAROSCOPY;  Surgeon: Jules Husbands, MD;  Location: [facility];  Service: General;  Laterality: N/A;   Family History  Problem Relation Age of Onset   Sudden death Father        accident   Coronary artery disease Mother    Emphysema Mother    Diverticulosis Mother    Thyroid disease Mother  Asthma Brother    Bone cancer Brother    Diabetes Paternal Grandmother    Sarcoidosis Son    Thyroid disease Daughter    Anesthesia problems Neg Hx    Hypotension Neg Hx    Malignant hyperthermia Neg Hx    Pseudochol deficiency Neg Hx    Colon cancer Neg Hx    Social History   Socioeconomic History   Marital status: Married    Spouse name: June   Number of children: 7   Years of education: 2 years college   Highest education level: Not on file  Occupational History   Not on file  Tobacco Use   Smoking status: Former    Packs/day: 1.00    Years: 18.00    Total pack years: 18.00    Types: Cigarettes    Quit date: 03/01/1966    Years since quitting: 55.5   Smokeless tobacco: Never  Vaping Use   Vaping Use: Never used  Substance and Sexual Activity   Alcohol use: Yes    Alcohol/week: 4.0 standard drinks of alcohol    Types: 4 Shots of liquor per week    Comment: social, few drinks a week   Drug use: No   Sexual activity: Yes    Birth control/protection: Post-menopausal, Surgical  Other Topics Concern   Not on file  Social History Narrative   09/20/19   From: [place]   Living: with wife June (1985)   Work: retired from Social research officer, government, 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 [employer]      Family: 5 children (one passed  away) 2 stepchildren, 14 grandchildren, 9 great-grandchildren - Psychiatrist and granddaughter nearby otherwise across the country       Enjoys: fly and play golf, reading      Exercise: walking the dog   Diet: diabetic diet      Safety   Seat belts: Yes    Guns: Yes  and secure   Safe in relationships: Yes    Social Determinants of Radio broadcast assistant Strain: Low Risk  (09/07/2021)   Overall Financial Resource Strain (CARDIA)    Difficulty of Paying Living Expenses: Not hard at all  Food Insecurity: No Food Insecurity (09/07/2021)   Hunger Vital Sign    Worried About Running Out of Food in the Last Year: Never true    [place] in the Last Year: Never true  Transportation Needs: No Transportation Needs (09/07/2021)   PRAPARE - Hydrologist (Medical): No    Lack of Transportation (Non-Medical): No  Physical Activity: Insufficiently Active (09/07/2021)   Exercise Vital Sign    Days of Exercise per Week: 2 days    Minutes of Exercise per Session: 20 min  Stress: No Stress Concern Present (09/07/2021)   [place]    Feeling of Stress : Not at all  Social Connections: Moderately Integrated (09/07/2021)   Social Connection and Isolation Panel [NHANES]    Frequency of Communication with Friends and Family: Once a week    Frequency of Social Gatherings with Friends and Family: More than three times a week    Attends Religious Services: More than 4 times per year    Active Member of [employer] or Organizations: No    Attends Archivist Meetings: Never    Marital Status: Married    Tobacco Counseling Counseling given: Not Answered   Clinical Intake:  Pre-visit  preparation completed: Yes  Pain : No/denies pain     Nutritional Risks: None Diabetes: Yes CBG done?: No Did pt. bring in CBG monitor from home?: No  How often do you need to have someone help you when  you read instructions, pamphlets, or other written materials from your doctor or pharmacy?: 1 - Never  Diabetic?yes Nutrition Risk Assessment:  Has the patient had any N/V/D within the last 2 months?  No  Does the patient have any non-healing wounds?  No  Has the patient had any unintentional weight loss or weight gain?  No   Diabetes:  Is the patient diabetic?  Yes  If diabetic, was a CBG obtained today?  No  Did the patient bring in their glucometer from home?  No  How often do you monitor your CBG's? weekly  Financial Strains and Diabetes Management:  Are you having any financial strains with the device, your supplies or your medication? No .  Does the patient want to be seen by Chronic Care Management for management of their diabetes?  No  Would the patient like to be referred to a Nutritionist or for Diabetic Management?  No   Diabetic Exams:  Diabetic Eye Exam: Completed 03/07/20. Overdue for diabetic eye exam. Pt has been advised about the importance in completing this exam.   Diabetic Foot Exam: Completed 06/24/21. Pt has been advised about the importance in completing this exam.   Interpreter Needed?: No  Information entered by :: Kirke Shaggy, LPN   Activities of Daily Living    09/07/2021    8:57 AM  In your present state of health, do you have any difficulty performing the following activities:  Hearing? 0  Vision? 0  Difficulty concentrating or making decisions? 0  Walking or climbing stairs? 1  Dressing or bathing? 0  Doing errands, shopping? 0  Preparing Food and eating ? N  Using the Toilet? N  In the past six months, have you accidently leaked urine? N  Do you have problems with loss of bowel control? N  Managing your Medications? N  Managing your Finances? N  Housekeeping or managing your Housekeeping? N    Patient Care Team: Lesleigh Noe, MD as PCP - General (Family Medicine) Minus Breeding, MD as PCP - Cardiology (Cardiology) Rana Snare,  MD (Inactive) as Consulting Physician (Urology) Lavonna Monarch, MD as Consulting Physician (Dermatology) Jodi Marble, MD as Consulting Physician (Otolaryngology) Shirley Muscat Loreen Freud, MD as Referring Physician (Optometry) Inocencio Homes, DPM as Consulting Physician (Podiatry) Bo Merino, MD as Consulting Physician (Rheumatology) Vickey Huger, MD as Consulting Physician (Orthopedic Surgery) Erline Levine, MD as Consulting Physician (Neurosurgery) Deboraha Sprang, MD as Consulting Physician (Cardiology) Elayne Snare, MD as Consulting Physician (Endocrinology) Kathrynn Ducking, MD (Inactive) as Consulting Physician (Neurology) Jodi Marble, MD as Consulting Physician (Otolaryngology) Juanita Craver, MD as Consulting Physician (Gastroenterology) Milus Banister, MD as Attending Physician (Gastroenterology)  Indicate any recent Medical Services you may have received from other than [facility] providers in the past year (date may be approximate).     Assessment:   This is a routine wellness examination for [place].  Hearing/Vision screen Hearing Screening - Comments:: No aids Vision Screening - Comments:: No glasses- Dr.Groat  Dietary issues and exercise activities discussed: Current Exercise Habits: Home exercise routine, Type of exercise: walking, Time (Minutes): 20, Frequency (Times/Week): 2, Weekly Exercise (Minutes/Week): 40, Intensity: Mild   Goals Addressed             This  Visit's Progress    DIET - EAT MORE FRUITS AND VEGETABLES         Depression Screen    09/07/2021    8:54 AM 09/04/2020    8:31 AM 09/20/2019    4:14 PM 06/06/2019   10:39 AM 03/21/2019    1:11 PM 02/13/2019    8:51 AM 11/15/2018    9:07 AM  PHQ 2/9 Scores  PHQ - 2 Score 0 0 0 0 0 0 0  PHQ- 9 Score 0 0 _0 0 0    Fall Risk    09/07/2021    8:57 AM 09/04/2020    8:31 AM 02/05/2020   12:26 PM 07/04/2018    9:44 AM 04/12/2018    3:58 PM  Fall Risk   Falls in the past year? 0 0 0 0 0  Number falls  in past yr: 0 0 0    Injury with Fall? 0 0     Risk for fall due to : No Fall Risks Medication side effect     Follow up Falls evaluation completed Falls evaluation completed;Falls prevention discussed  Falls evaluation completed Falls evaluation completed    FALL RISK PREVENTION PERTAINING TO THE HOME:  Any stairs in or around the home? Yes  If so, are there any without handrails? No  Home free of loose throw rugs in walkways, pet beds, electrical cords, etc? Yes  Adequate lighting in your home to reduce risk of falls? Yes   ASSISTIVE DEVICES UTILIZED TO PREVENT FALLS:  Life alert? No  Use of a cane, walker or w/c? Yes  Grab bars in the bathroom? Yes  Shower chair or bench in shower? Yes  Elevated toilet seat or a handicapped toilet? Yes    Cognitive Function: declined 2023      09/04/2020    8:46 AM  MMSE - Mini Mental State Exam  Orientation to time 5  Orientation to Place 5  Registration 3  Attention/ Calculation 5  Recall 3  Language- repeat 1        07/04/2018    9:45 AM 05/18/2017    1:35 PM  6CIT Screen  What Year? 0 points 0 points  What month? 0 points 0 points  What time? 0 points 0 points  Count back from 20 0 points 0 points  Months in reverse 0 points 0 points  Repeat phrase 0 points 0 points  Total Score 0 points 0 points    Immunizations Immunization History  Administered Date(s) Administered   Fluad Quad(high Dose 65+) 12/05/2019   Influenza Whole 01/09/2002   Influenza, High Dose Seasonal PF 11/24/2018, 02/08/2019   Influenza,inj,Quad PF,6+ Mos 11/29/2012, 11/29/2013   Influenza-Unspecified 11/30/2014, 11/22/2015, 01/12/2018   MMR 07/12/2017   PFIZER(Purple Top)SARS-COV-2 Vaccination 03/11/2019, 04/01/2019, 11/07/2019, 06/18/2020   Pfizer Covid-19 Vaccine Bivalent Booster 67yr & up 12/31/2020   Pneumococcal Conjugate-13 03/20/2014   Pneumococcal Polysaccharide-23 03/30/1996, 02/13/2010   Td 03/30/1996, 04/26/2007   Td (Adult) 03/30/1996,  04/26/2007   Tdap 07/24/2018   Zoster Recombinat (Shingrix) 07/24/2018, 11/24/2018, 02/08/2019    TDAP status: Up to date  Flu Vaccine status: Up to date  Pneumococcal vaccine status: Up to date  Covid-19 vaccine status: Completed vaccines  Qualifies for Shingles Vaccine? Yes   Zostavax completed No   Shingrix Completed?: Yes  Screening Tests Health Maintenance  Topic Date Due   OPHTHALMOLOGY EXAM  03/07/2021   INFLUENZA VACCINE  09/29/2021   HEMOGLOBIN A1C  12/17/2021   FOOT  EXAM  06/25/2022   TETANUS/TDAP  07/23/2028   Pneumonia Vaccine 27+ Years old  Completed   COVID-19 Vaccine  Completed   Zoster Vaccines- Shingrix  Completed   HPV VACCINES  Aged Out   COLONOSCOPY (Pts 45-59yr Insurance coverage will need to be confirmed)  Discontinued    Health Maintenance  Health Maintenance Due  Topic Date Due   OPHTHALMOLOGY EXAM  03/07/2021    Colorectal cancer screening: No longer required.   Lung Cancer Screening: (Low Dose CT Chest recommended if Age 86-80years, 30 pack-year currently smoking OR have quit w/in 15years.) does not qualify.   Additional Screening:  Hepatitis C Screening: does not qualify; Completed no  Vision Screening: Recommended annual ophthalmology exams for early detection of glaucoma and other disorders of the eye. Is the patient up to date with their annual eye exam?  Yes  Who is the provider or what is the name of the office in which the patient attends annual eye exams? Dr.Groat If pt is not established with a provider, would they like to be referred to a provider to establish care? No .   Dental Screening: Recommended annual dental exams for proper oral hygiene  Community Resource Referral / Chronic Care Management: CRR required this visit?  No   CCM required this visit?  No      Plan:     I have personally reviewed and noted the following in the patient's chart:   Medical and social history Use of alcohol, tobacco or illicit  drugs  Current medications and supplements including opioid prescriptions. Patient is not currently taking opioid prescriptions. Functional ability and status Nutritional status Physical activity Advanced directives List of other physicians Hospitalizations, surgeries, and ER visits in previous 12 months Vitals Screenings to include cognitive, depression, and falls Referrals and appointments  In addition, I have reviewed and discussed with patient certain preventive protocols, quality metrics, and best practice recommendations. A written personalized care plan for preventive services as well as general preventive health recommendations were provided to patient.     LDionisio David LPN   [id number]  Nurse Notes: none

## 2021-09-16 ENCOUNTER — Encounter: Payer: Self-pay | Admitting: Dermatology

## 2021-09-16 ENCOUNTER — Ambulatory Visit (INDEPENDENT_AMBULATORY_CARE_PROVIDER_SITE_OTHER): Payer: Medicare Other | Admitting: Dermatology

## 2021-09-16 DIAGNOSIS — Z1283 Encounter for screening for malignant neoplasm of skin: Secondary | ICD-10-CM | POA: Diagnosis not present

## 2021-09-16 DIAGNOSIS — L570 Actinic keratosis: Secondary | ICD-10-CM | POA: Diagnosis not present

## 2021-09-16 DIAGNOSIS — D485 Neoplasm of uncertain behavior of skin: Secondary | ICD-10-CM

## 2021-09-16 DIAGNOSIS — D3611 Benign neoplasm of peripheral nerves and autonomic nervous system of face, head, and neck: Secondary | ICD-10-CM

## 2021-09-16 NOTE — Patient Instructions (Signed)

## 2021-09-23 ENCOUNTER — Ambulatory Visit: Payer: Medicare Other | Admitting: Podiatry

## 2021-09-30 ENCOUNTER — Ambulatory Visit (INDEPENDENT_AMBULATORY_CARE_PROVIDER_SITE_OTHER): Payer: Medicare Other | Admitting: Podiatry

## 2021-09-30 DIAGNOSIS — B351 Tinea unguium: Secondary | ICD-10-CM | POA: Diagnosis not present

## 2021-09-30 DIAGNOSIS — M25572 Pain in left ankle and joints of left foot: Secondary | ICD-10-CM

## 2021-09-30 DIAGNOSIS — M79675 Pain in left toe(s): Secondary | ICD-10-CM | POA: Diagnosis not present

## 2021-09-30 DIAGNOSIS — M79674 Pain in right toe(s): Secondary | ICD-10-CM | POA: Diagnosis not present

## 2021-09-30 DIAGNOSIS — M19072 Primary osteoarthritis, left ankle and foot: Secondary | ICD-10-CM

## 2021-09-30 NOTE — Patient Instructions (Signed)
The shoes I had on today are the Atom shoes from [place]. Some of my patients also like to wear Hey Dude shoes which can be comfortable and have a square toe box

## 2021-09-30 NOTE — Progress Notes (Signed)
  Subjective:  Patient ID: Kyle Simon, male    DOB: Feb 13, 1933,  MRN: [id number]  Chief Complaint  Patient presents with   Nail Problem    Thick painful toenails, 3 month follow up    Tendonitis    Still having swelling in left ankle    88 y.o. male presents with the above complaint.  Right side is doing okay, the left ankle still quite swollen and tender.  The toenails are thickened elongated and giving him trouble as well again  Objective:  Physical Exam: warm, good capillary refill, no trophic changes or ulcerative lesions, normal DP and PT pulses, normal monofilament exam, and normal sensory exam.  Pes planus deformity.  Chronic venous insufficiency with numerous varicose and spider veins.   Left Foot: dystrophic yellowed discolored nail plates with subungual debris and left chronic ankle swelling, minimal to no pain in the ankle joint lateral gutter with compression along the anterior joint line, he also has pain in the sinus tarsi and with range of motion of the subtalar joint Right Foot: dystrophic yellowed discolored nail plates with subungual debris No swelling in the right side today   New radiographs showed no significant change since last visit with degenerative changes in the subtalar and ankle joint  Ultrasound negative for DVT and phlebitis no masses seen Assessment:   1. Pain due to onychomycosis of toenails of both feet   2. Sinus tarsitis, left   3. Arthritis of left ankle       Plan:  Patient was evaluated and treated and all questions answered.  Discussed the etiology and treatment options for the condition in detail with the patient. Educated patient on the topical and oral treatment options for mycotic nails. Recommended debridement of the nails today. Sharp and mechanical debridement performed of all painful and mycotic nails today. Nails debrided in length and thickness using a nail nipper to level of comfort. Discussed treatment options including  appropriate shoe gear. Follow up as needed for painful nails.  Patient educated on diabetes. Discussed proper diabetic foot care and discussed risks and complications of disease. Educated patient in depth on reasons to return to the office immediately should he/she discover anything concerning or new on the feet. All questions answered. Discussed proper shoes as well.   Still having pain and swelling in the lateral ankle and sinus tarsi.  Discussed that this is likely degenerative changes.  Corticosteroid injection was helpful on the right side.  I recommended a repeat injection on the left side today.  Separately through different approaches, and following sterile prep with Betadine, 2 mg of dexamethasone and 5 mg Kenalog with 1 cc of lidocaine 2% was injected directly into the sinus tarsi and subtalar joint, and ankle joint respectively.  He tolerated the procedures well. Return in about 3 months (around 12/31/2021) for at risk foot care, f/u left ankle arthritis.

## 2021-10-06 NOTE — Progress Notes (Addendum)
Office Visit Note  Patient: Kyle Simon             Date of Birth: [birth year]           MRN: [id number]             PCP: Lynnda Child, MD Referring: Lynnda Child, MD Visit Date: 10/20/2021 Occupation: @GUAROCC @  Subjective:  Left ankle pain  History of Present Illness: Kyle Simon is a 88 y.o. male with history of osteoarthritis and osteoporosis.  He returns today after his last visit in September 2020.  He states for the last 1-1/2-year he has been having pain and discomfort in his left ankle.  He has been seeing Dr. Lilian Kapur at [facility].  He states Dr. Lilian Kapur injected his left ankle twice with cortisone which gave him some relief.  Last cortisone injection was on September 30, 2021.  He continues to have pain and swelling in his left ankle joint.  He also had discomfort in his right ankle joint few months back which has improved.  He denies any lower back pain today.  He continues to have discomfort in his knee joints which have been replaced.  He states he went to [place] for a second opinion and surgery or revision was not advised.  Right shoulder joint continues to hurt.  Activities of Daily Living:  Patient reports morning stiffness for 5 minutes.   Patient Denies nocturnal pain.  Difficulty dressing/grooming: Denies Difficulty climbing stairs: Reports Difficulty getting out of chair: Reports Difficulty using hands for taps, buttons, cutlery, and/or writing: Denies  Review of Systems  Constitutional:  Negative for fatigue.  HENT:  Negative for mouth sores and mouth dryness.   Eyes:  Negative for dryness.  Respiratory:  Negative for shortness of breath.   Cardiovascular:  Negative for chest pain and palpitations.  Gastrointestinal:  Negative for blood in stool, constipation and diarrhea.  Endocrine: Negative for increased urination.  Genitourinary:  Negative for involuntary urination.  Musculoskeletal:  Positive for joint pain, gait problem, joint pain and  morning stiffness. Negative for joint swelling, myalgias, muscle weakness, muscle tenderness and myalgias.  Skin:  Negative for color change, rash and sensitivity to sunlight.  Allergic/Immunologic: Negative for susceptible to infections.  Neurological:  Negative for dizziness and headaches.  Hematological:  Negative for swollen glands.  Psychiatric/Behavioral:  Negative for depressed mood and sleep disturbance. The patient is not nervous/anxious.     PMFS History:  Patient Active Problem List   Diagnosis Date Noted   Balance problems 12/17/2020   Chronic pain of left ankle 12/17/2020   Dyslipidemia 11/30/2020   SBO (small bowel obstruction) (HCC) 09/06/2020   Hyperkalemia 02/05/2020   Shortness of breath 02/05/2020   Thoracic aortic aneurysm (HCC) 12/10/2019   Educated about COVID-19 virus infection 11/29/2019   SIADH (syndrome of inappropriate ADH production) (HCC) 03/21/2019   Flatulence 11/29/2018   Elevated coronary artery calcium score 11/25/2018   History of noncompliance with medical treatment, presenting hazards to health 11/20/2018   Aortic atherosclerosis (HCC) 10/05/2017   Fatigue 08/22/2017   Chronic pain of left knee 08/22/2017   Stressful life event affecting family-loss on May 31st 08/22/2017   Inactivity-recently due to knee pain 08/22/2017   Lumbar radiculopathy 06/16/2017   Total knee replacement status, right 04/14/2017   DM assoc with CKD (chronic kidney disease), stage III (HCC) 04/14/2017   Arthralgia of right temporomandibular joint 03/02/2017   Chronic eczematous otitis externa of both ears 03/02/2017  Presbycusis of both ears 03/02/2017   H/O total knee replacement, bilateral 11/22/2016   ETD (Eustachian tube dysfunction), right 09/15/2016   Strain of masseter muscle 09/15/2016   Trochanteric bursitis of right hip 09/07/2016   Age-related osteoporosis without current pathological fracture 07/16/2016   History of lumbar fusion 07/16/2016   History of  total knee arthroplasty, left 07/16/2016   Cough 01/04/2016   High risk medications (not anticoagulants) long-term use 01/04/2016   Hypomagnesemia 12/29/2015   h/o Hyponatremia 12/29/2015   Cerumen impaction 08/25/2015   Constipation 08/06/2015   Type 2 diabetes mellitus with other specified complication (HCC) 08/05/2015   Flatulence symptom 07/25/2015   Diarrhea 06/21/2015   Chronic rhinitis 11/01/2014   Hypertension associated with diabetes (HCC) 03/20/2014   Spondylolisthesis of lumbar region 10/23/2013   Diverticulosis of colon without hemorrhage 01/22/2013   B12 deficiency anemia 01/22/2013   Anemia, unspecified 08/13/2012   Synovial cyst of lumbar spine 07/26/2011   Spasms of the hands or feet 07/26/2011   GERD (gastroesophageal reflux disease) 03/26/2011   SENILE KERATOSIS 06/12/2008   Hyperlipidemia associated with type 2 diabetes mellitus (HCC) 04/19/2007   SNORING, HX OF 04/19/2007   BPH (benign prostatic hyperplasia) 04/18/2007   Hypothyroidism 04/20/2006    Past Medical History:  Diagnosis Date   Arthritis    both hands;Dr.Shatana Saxton   Constipation due to pain medication    Diabetes mellitus without complication (HCC)    Type II. Uses no medication. Pt controls with diet and weight   Enlarged prostate    Frequent urination at night    GERD (gastroesophageal reflux disease)    History of noncompliance with medical treatment, presenting hazards to health 11/20/2018   Hyperlipidemia associated with type 2 diabetes mellitus (HCC) 04/19/2007   Qualifier: Diagnosis of  By: Alwyn Ren MD, William     Hypertension    Hypothyroidism    Polio    as a child w/o complications   Snoring    no sleep apnea   Statin declined-  11/20/2018   patient understands risks associated with this decision and has been advised to restart by cardiologist as well as myself several times   Torn meniscus    Transfusion history 2012   post TKR    Family History  Problem Relation Age of Onset    Sudden death Father        accident   Coronary artery disease Mother    Emphysema Mother    Diverticulosis Mother    Thyroid disease Mother    Asthma Brother    Bone cancer Brother    Diabetes Paternal Grandmother    Sarcoidosis Son    Thyroid disease Daughter    Anesthesia problems Neg Hx    Hypotension Neg Hx    Malignant hyperthermia Neg Hx    Pseudochol deficiency Neg Hx    Colon cancer Neg Hx    Past Surgical History:  Procedure Laterality Date   ANTERIOR LAT LUMBAR FUSION Right 11/03/2017   Procedure: Right Lumbar three-four Anterolateral lumbar interbody fusion with lateral plate;  Surgeon: Maeola Harman, MD;  Location: [facility] OR;  Service: Neurosurgery;  Laterality: Right;   BACK SURGERY  2011   hemiarthroplasty 01/11/1999 and 6   CARDIAC CATHETERIZATION  09/2001   negative   COLONOSCOPY     EYE SURGERY  2003   R&L cataract removed & IOL- 2003 & 2007,Dr.Jenkins   INGUINAL HERNIA REPAIR  2002   right, Dr. Luan Pulling   JOINT REPLACEMENT  2012  left knee replacement   LIPOMA EXCISION     back   MENISCECTOMY Bilateral    Dr. Thurston Hole   PROSTATE SURGERY  07/2006   for urinary urgency, Dr. Tiana Loft CUFF REPAIR Right October, 2016   sinus & throat surgery-1995  1995   TONSILLECTOMY     TOTAL KNEE ARTHROPLASTY  01/11/2011   Procedure: TOTAL KNEE ARTHROPLASTY;  Surgeon: Raymon Mutton, MD;  Location: [facility] OR;  Service: Orthopedics;  Laterality: Left;   TOTAL KNEE ARTHROPLASTY Right 11/22/2016   Procedure: TOTAL KNEE ARTHROPLASTY;  Surgeon: Dannielle Huh, MD;  Location: [facility] OR;  Service: Orthopedics;  Laterality: Right;   UVULECTOMY  1996   Dr. 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      Family: 5 children (one passed away) 2 stepchildren, 14 grandchildren, 9 great-grandchildren - Museum/gallery conservator and granddaughter nearby otherwise across the country       Enjoys: fly and play golf, reading      Exercise: walking the dog   Diet: diabetic diet      Safety   Seat belts: Yes    Guns: Yes  and secure   Safe in relationships: Yes    Immunization History  Administered Date(s) Administered   Fluad Quad(high Dose 65+) 12/05/2019   Influenza Whole 01/09/2002   Influenza, High Dose Seasonal PF 11/24/2018, 02/08/2019   Influenza,inj,Quad PF,6+ Mos 11/29/2012, 11/29/2013   Influenza-Unspecified 11/30/2014, 11/22/2015, 01/12/2018   MMR 07/12/2017   PFIZER(Purple Top)SARS-COV-2 Vaccination 03/11/2019, 04/01/2019, 11/07/2019, 06/18/2020   Pfizer Covid-19 Vaccine Bivalent Booster 38yrs & up 12/31/2020   Pneumococcal Conjugate-13 03/20/2014   Pneumococcal Polysaccharide-23 03/30/1996, 02/13/2010   Td 03/30/1996, 04/26/2007   Td (Adult) 03/30/1996, 04/26/2007   Tdap 07/24/2018   Zoster Recombinat (Shingrix) 07/24/2018, 11/24/2018, 02/08/2019     Objective: Vital Signs: BP 116/68 (BP Location: Left Arm, Patient Position: Sitting, Cuff Size: Normal)   Pulse 66   Resp 17   Ht 5\' 10"  (1.778 m)   Wt 186 lb (84.4 kg)   BMI 26.69 kg/m    Physical Exam Vitals and nursing note reviewed.  Constitutional:      Appearance: He is well-developed.  HENT:     Head: Normocephalic and atraumatic.  Eyes:     Conjunctiva/sclera: Conjunctivae normal.     Pupils: Pupils are equal, round, and reactive to light.  Cardiovascular:     Rate and Rhythm: Normal rate and regular rhythm.     Heart sounds: Normal heart sounds.  Pulmonary:     Effort: Pulmonary effort is normal.     Breath sounds: Normal breath sounds.  Abdominal:     General: Bowel sounds  are normal.     Palpations: Abdomen is soft.  Musculoskeletal:     Cervical back: Normal range of motion and neck supple.     Right lower leg: Edema present.     Left lower leg: Edema present.  Skin:    General: Skin is warm and dry.     Capillary Refill: Capillary  refill takes less than 2 seconds.  Neurological:     Mental Status: He is alert and oriented to person, place, and time.  Psychiatric:        Behavior: Behavior normal.      Musculoskeletal Exam: He had limited range of motion of the cervical spine.  He had thoracic kyphosis.  Shoulder joints, elbow joints, wrist joints with good range of motion.  Bilateral PIP and DIP thickening was consistent with osteoarthritis.  Hip joints were in good range of motion.  Bilateral knee joints were replaced.  He had limited extension of the right knee joint.  He had pedal edema over bilateral ankles.  He had tenderness on palpation over left ankle joint but no warmth was noted.  PIP and DIP thickening in bilateral feet was noted.  CDAI Exam: CDAI Score: -- Patient Global: --; Provider Global: -- Swollen: --; Tender: -- Joint Exam 10/20/2021   No joint exam has been documented for this visit   There is currently no information documented on the homunculus. Go to the Rheumatology activity and complete the homunculus joint exam.  Investigation: No additional findings.  Imaging: No results found.  Recent Labs: Lab Results  Component Value Date   WBC 6.4 08/20/2021   HGB 12.0 (L) 08/20/2021   PLT 251.0 08/20/2021   NA 129 (L) 08/20/2021   K 4.7 08/20/2021   CL 97 08/20/2021   CO2 27 08/20/2021   GLUCOSE 120 (H) 08/20/2021   BUN 16 08/20/2021   CREATININE 0.90 08/20/2021   BILITOT 1.7 (H) 09/06/2020   ALKPHOS 75 09/06/2020   AST 24 09/06/2020   ALT 18 09/06/2020   PROT 8.1 09/06/2020   ALBUMIN 4.3 09/06/2020   CALCIUM 9.3 08/20/2021   GFRAA 85 03/21/2019    Speciality Comments: No specialty comments  available.  Procedures:  No procedures performed Allergies: Simvastatin, Other, and Oxycodone   Assessment / Plan:     Visit Diagnoses: Chronic pain of left ankle -she has been experiencing pain and discomfort in his left ankle joint off-and-on for the last 1-1/2-year.  On my examination today he had no warmth on palpation.  Although he had some tenderness and edema.  He had edema on his bilateral lower extremities more prominent in the left lower extremity.  I advised compression socks.  Patient states he did get compression socks per advice of Dr. Lilian Kapur but they were inconvenient to put on.  He has not noticed some improvement in his left ankle discomfort since he had cortisone injection.  I could not find previous x-rays to review.  I will obtain following labs to evaluate this further.  Plan: Sedimentation rate, Rheumatoid factor, Cyclic citrul peptide antibody, IgG, Uric acid  Pedal edema-he had bilateral pedal edema.  H/O total knee replacement, bilateral - Right total knee replacement 11/22/2016 by Dr. Valentina Gu, left total knee replacement 2012 by Dr. Valentina Gu.  Patient continues to have bilateral knee joint pain.  He states that he was evaluated by an orthopedic surgeon in [place] for second opinion who did not advise further surgery.  Chronic right shoulder pain-he continues to have chronic right shoulder joint pain.  History of lumbar fusion-lower back pain improved after the lumbar spine fusion.  DDD (degenerative disc disease), lumbar  Age-related osteoporosis without current pathological fracture - 05/27/2015 DEXA T score -2.5 right radius.  11/13/18: T-score -2.7 BMD 0.672 right distal radius.  We discussed Reclast infusions at the last visit but he declined.  I do not have  more recent DEXA scan on him.  I advised him to discuss this further with his PCP.  Other medical problems are listed as follows:  History of anemia  History of gastroesophageal reflux (GERD)  History of  diabetes mellitus  History of hyperlipidemia  History of hypertension  Aortic atherosclerosis (HCC)  Diverticulosis of colon without hemorrhage  Type 2 diabetes mellitus with other specified complication, unspecified whether long term insulin use (HCC) - DM assoc with CKD (chronic kidney disease), stage III   Orders: Orders Placed This Encounter  Procedures   Sedimentation rate   Rheumatoid factor   Cyclic citrul peptide antibody, IgG   Uric acid   No orders of the defined types were placed in this encounter.   Face-to-face time spent with patient was 30 minutes. Greater than 50% of time was spent in counseling and coordination of care.  Follow-Up Instructions: Return in about 3 months (around 01/20/2022).   Pollyann Savoy, MD  Note - This record has been created using Animal nutritionist.  Chart creation errors have been sought, but may not always  have been located. Such creation errors do not reflect on  the standard of medical care.

## 2021-10-07 ENCOUNTER — Encounter: Payer: Self-pay | Admitting: Dermatology

## 2021-10-07 NOTE — Progress Notes (Signed)
   Follow-Up Visit   Subjective  Kyle Simon is a 88 y.o. male who presents for the following: Actinic Keratosis (Patient here today to with his wife to have his temples rechecked. Per patient both temples are still itching, no bleeding. ).  Recheck spots on temples that were previously treated, check other spots. Location:  Duration:  Quality:  Associated Signs/Symptoms: Modifying Factors:  Severity:  Timing: Context:   Objective  Well appearing patient in no apparent distress; mood and affect are within normal limits. No atypical nevi or signs of NMSC noted at the time of the visit.   Neck - Posterior     Left Temple, Right Temple (2) 26m gritty pink crusts    All skin waist up examined.   Assessment & Plan    Screening exam for skin cancer  Neoplasm of uncertain behavior of skin Neck - Posterior  Skin / nail biopsy Type of biopsy: tangential   Informed consent: discussed and consent obtained   Timeout: patient name, date of birth, surgical site, and procedure verified   Anesthesia: the lesion was anesthetized in a standard fashion   Anesthetic:  1% lidocaine w/ epinephrine 1-100,000 local infiltration Instrument used: flexible razor blade   Hemostasis achieved with: aluminum chloride and electrodesiccation   Outcome: patient tolerated procedure well   Post-procedure details: wound care instructions given    Specimen 1 - Surgical pathology Differential Diagnosis: tag/NF  Check Margins: No  AK (actinic keratosis) (3) Left Temple; Right Temple (2)  Harder freeze due to previous ln2  Destruction of lesion - Left Temple, Right Temple Complexity: simple   Destruction method: cryotherapy   Informed consent: discussed and consent obtained   Timeout:  patient name, date of birth, surgical site, and procedure verified Lesion destroyed using liquid nitrogen: Yes   Cryotherapy cycles:  5 Outcome: patient tolerated procedure well with no complications         I, SLavonna Monarch MD, have reviewed all documentation for this visit.  The documentation on 10/07/21 for the exam, diagnosis, procedures, and orders are all accurate and complete.

## 2021-10-13 ENCOUNTER — Encounter (INDEPENDENT_AMBULATORY_CARE_PROVIDER_SITE_OTHER): Payer: Medicare Other | Admitting: Ophthalmology

## 2021-10-13 DIAGNOSIS — E113391 Type 2 diabetes mellitus with moderate nonproliferative diabetic retinopathy without macular edema, right eye: Secondary | ICD-10-CM | POA: Diagnosis not present

## 2021-10-13 DIAGNOSIS — E113312 Type 2 diabetes mellitus with moderate nonproliferative diabetic retinopathy with macular edema, left eye: Secondary | ICD-10-CM

## 2021-10-13 DIAGNOSIS — H43813 Vitreous degeneration, bilateral: Secondary | ICD-10-CM | POA: Diagnosis not present

## 2021-10-20 ENCOUNTER — Ambulatory Visit: Payer: Medicare Other | Attending: Rheumatology | Admitting: Rheumatology

## 2021-10-20 ENCOUNTER — Encounter: Payer: Self-pay | Admitting: Rheumatology

## 2021-10-20 VITALS — BP 116/68 | HR 66 | Resp 17 | Ht 70.0 in | Wt 186.0 lb

## 2021-10-20 DIAGNOSIS — I700 Atherosclerosis of aorta: Secondary | ICD-10-CM | POA: Diagnosis not present

## 2021-10-20 DIAGNOSIS — G8929 Other chronic pain: Secondary | ICD-10-CM | POA: Diagnosis not present

## 2021-10-20 DIAGNOSIS — Z8719 Personal history of other diseases of the digestive system: Secondary | ICD-10-CM

## 2021-10-20 DIAGNOSIS — R600 Localized edema: Secondary | ICD-10-CM

## 2021-10-20 DIAGNOSIS — Z981 Arthrodesis status: Secondary | ICD-10-CM | POA: Diagnosis not present

## 2021-10-20 DIAGNOSIS — M25511 Pain in right shoulder: Secondary | ICD-10-CM | POA: Insufficient documentation

## 2021-10-20 DIAGNOSIS — Z96653 Presence of artificial knee joint, bilateral: Secondary | ICD-10-CM

## 2021-10-20 DIAGNOSIS — Z8639 Personal history of other endocrine, nutritional and metabolic disease: Secondary | ICD-10-CM

## 2021-10-20 DIAGNOSIS — M5136 Other intervertebral disc degeneration, lumbar region: Secondary | ICD-10-CM | POA: Diagnosis not present

## 2021-10-20 DIAGNOSIS — K5730 Diverticulosis of large intestine without perforation or abscess without bleeding: Secondary | ICD-10-CM | POA: Diagnosis not present

## 2021-10-20 DIAGNOSIS — M4316 Spondylolisthesis, lumbar region: Secondary | ICD-10-CM

## 2021-10-20 DIAGNOSIS — Z862 Personal history of diseases of the blood and blood-forming organs and certain disorders involving the immune mechanism: Secondary | ICD-10-CM | POA: Diagnosis not present

## 2021-10-20 DIAGNOSIS — Z8679 Personal history of other diseases of the circulatory system: Secondary | ICD-10-CM

## 2021-10-20 DIAGNOSIS — E1169 Type 2 diabetes mellitus with other specified complication: Secondary | ICD-10-CM | POA: Diagnosis not present

## 2021-10-20 DIAGNOSIS — M810 Age-related osteoporosis without current pathological fracture: Secondary | ICD-10-CM

## 2021-10-20 DIAGNOSIS — M25572 Pain in left ankle and joints of left foot: Secondary | ICD-10-CM | POA: Diagnosis not present

## 2021-10-20 DIAGNOSIS — M51369 Other intervertebral disc degeneration, lumbar region without mention of lumbar back pain or lower extremity pain: Secondary | ICD-10-CM

## 2021-10-22 LAB — URIC ACID: Uric Acid, Serum: 3.9 mg/dL — ABNORMAL LOW (ref 4.0–8.0)

## 2021-10-22 LAB — RHEUMATOID FACTOR: Rheumatoid fact SerPl-aCnc: <14 IU/mL (ref <14)

## 2021-10-22 LAB — SEDIMENTATION RATE: Sed Rate: 22 mm/h — ABNORMAL HIGH (ref 0–20)

## 2021-10-22 LAB — CYCLIC CITRUL PEPTIDE ANTIBODY, IGG: Cyclic Citrullin Peptide Ab: <16 UNITS

## 2021-10-22 NOTE — Progress Notes (Signed)
Sed rate is mildly elevated not significant.  Uric acid is within normal limits.  Rheumatoid factor and anti-CCP are negative.  (Labs are negative for gout and rheumatoid arthritis)

## 2021-11-08 ENCOUNTER — Other Ambulatory Visit: Payer: Self-pay | Admitting: Endocrinology

## 2021-11-08 DIAGNOSIS — E119 Type 2 diabetes mellitus without complications: Secondary | ICD-10-CM

## 2021-11-08 DIAGNOSIS — E063 Autoimmune thyroiditis: Secondary | ICD-10-CM

## 2021-11-09 ENCOUNTER — Other Ambulatory Visit (INDEPENDENT_AMBULATORY_CARE_PROVIDER_SITE_OTHER): Payer: Medicare Other

## 2021-11-09 DIAGNOSIS — E063 Autoimmune thyroiditis: Secondary | ICD-10-CM | POA: Diagnosis not present

## 2021-11-09 DIAGNOSIS — E119 Type 2 diabetes mellitus without complications: Secondary | ICD-10-CM | POA: Diagnosis not present

## 2021-11-09 LAB — BASIC METABOLIC PANEL
BUN: 14 mg/dL (ref 6–23)
CO2: 24 mEq/L (ref 19–32)
Calcium: 9.1 mg/dL (ref 8.4–10.5)
Chloride: 97 mEq/L (ref 96–112)
Creatinine, Ser: 0.98 mg/dL (ref 0.40–1.50)
GFR: 69.72 mL/min (ref >60.00)
Glucose, Bld: 108 mg/dL — ABNORMAL HIGH (ref 70–99)
Potassium: 4.2 mEq/L (ref 3.5–5.1)
Sodium: 130 mEq/L — ABNORMAL LOW (ref 135–145)

## 2021-11-09 LAB — T4, FREE: Free T4: 1.22 ng/dL (ref 0.60–1.60)

## 2021-11-09 LAB — TSH: TSH: 3.17 u[IU]/mL (ref 0.35–5.50)

## 2021-11-09 LAB — HEMOGLOBIN A1C: Hgb A1c MFr Bld: 6.9 % — ABNORMAL HIGH (ref 4.6–6.5)

## 2021-11-12 ENCOUNTER — Ambulatory Visit: Payer: Medicare Other | Admitting: Endocrinology

## 2021-11-12 ENCOUNTER — Ambulatory Visit (INDEPENDENT_AMBULATORY_CARE_PROVIDER_SITE_OTHER): Payer: Medicare Other | Admitting: Podiatry

## 2021-11-12 DIAGNOSIS — M7751 Other enthesopathy of right foot: Secondary | ICD-10-CM | POA: Diagnosis not present

## 2021-11-19 ENCOUNTER — Ambulatory Visit: Payer: Medicare Other | Admitting: Endocrinology

## 2021-11-23 ENCOUNTER — Ambulatory Visit (INDEPENDENT_AMBULATORY_CARE_PROVIDER_SITE_OTHER): Payer: Medicare Other | Admitting: Endocrinology

## 2021-11-23 ENCOUNTER — Encounter: Payer: Self-pay | Admitting: Endocrinology

## 2021-11-23 VITALS — BP 124/68 | HR 60 | Ht 70.0 in | Wt 186.2 lb

## 2021-11-23 DIAGNOSIS — E119 Type 2 diabetes mellitus without complications: Secondary | ICD-10-CM

## 2021-11-23 DIAGNOSIS — E871 Hypo-osmolality and hyponatremia: Secondary | ICD-10-CM | POA: Diagnosis not present

## 2021-11-23 DIAGNOSIS — E063 Autoimmune thyroiditis: Secondary | ICD-10-CM

## 2021-11-23 NOTE — Progress Notes (Signed)
Patient ID: Kyle Simon, male   DOB: 08/11/1932, 88 y.o.   MRN: [id number]            Reason for Appointment: Endocrinology follow-up visit    History of Present Illness:   HYPONATREMIA:  Background history:  Serum sodium has been low since at least 2011 and has been as low as 125 in the past More recently lowest reading 127 He has not had any associated symptoms of decreased appetite, nausea or drowsiness Not on any thiazide diuretics or SSRI drugs Has had negative endocrine work-up for cortisol deficiencies  Has a history of drinking a lot of water partly for his constipation  Recent history: His urine osmolality was last high at 551 Was also high at 442 on initial evaluation Urine sodium was over 90 previously  He was previously told to cut back on his fluid intake especially coffee He is usually drinking only 2 cup of coffee   With a trial of salt tablets his sodium did not improve, it was still down to 126 in 9/21 Also was having nausea from taking more than 1 regular tablet daily  Initially sodium was significantly better after starting DEMECLOCYCLINE 150 mg daily in 9/21 However this was stopped when his sodium decreased on the treatment down to 126  He was empirically started on FLORINEF in April 2023 because of persistently low sodium, mild increase in potassium and history of relatively high urine sodium Also had a potassium of 5.3 at baseline He is now taking this 3 times a week  Sodium is now 130 compared to 126 in April Again subjectively he did not feel any different with improvement of his sodium He thinks he is at some difficulty with balance related to joint and foot issues  No swelling of the ankles with Florinef  He has been consistent with taking his Florinef now   CT scan of chest did not show any tumor  Lab Results  Component Value Date   NA 130 (L) 11/09/2021   K 4.2 11/09/2021   CL 97 11/09/2021   CO2 24 11/09/2021      Hypothyroidism was first diagnosed  around 2005 years ago   At the time of diagnosis patient was not having symptoms of  fatigue, cold sensitivity, difficulty concentrating, dry skin, weight gain Likely had only done routine testing and was told that he was hypothyroid and started him on unknown dose of levothyroxine  He did not start feeling any different when he started taking levothyroxine initially Also usually does not feel any different with dosage adjustments that have been made Apparently his dose was 150 g for some time but this was reduced in 06/2015 when his TSH was low normal Since then he has been taking 150 g 5 days a week and 125 g twice a week on-insulin initial consultation he was complaining that since spring time his energy level has been not as good and he has not been able to do as much with activities as he used to   He was referred here because his TSH is tending to be high but also his free T4 was relatively higher in 7/18  Recent history: On his initial consultation since his TSH was still relatively high and he was taking the equivalent of about 142 g of levothyroxine daily he was switched to taking 150 g once daily  He is taking his levothyroxine regularly before breakfast every morning The dose has been gradually decreased the last 2  times  He is now on 137 mcg levothyroxine consistently Has no unusual fatigue TSH again normal    Patient's weight history is as follows:  Wt Readings from Last 3 Encounters:  11/23/21 186 lb 3.2 oz (84.5 kg)  10/20/21 186 lb (84.4 kg)  09/07/21 188 lb (85.3 kg)    Thyroid function results have been as follows:  Lab Results  Component Value Date   TSH 3.17 11/09/2021   TSH 0.61 08/20/2021   TSH 0.44 06/15/2021   TSH 0.10 (L) 04/21/2021   FREET4 1.22 11/09/2021   FREET4 1.60 08/20/2021   FREET4 1.24 06/15/2021   FREET4 1.67 (H) 04/21/2021   T3FREE 3.2 12/28/2016   T3FREE 2.7 10/17/2015    Lab Results   Component Value Date   TSH 3.17 11/09/2021   TSH 0.61 08/20/2021   TSH 0.44 06/15/2021   TSH 0.10 (L) 04/21/2021   TSH 0.05 (L) 02/16/2021   TSH 1.02 10/13/2020   TSH 0.55 06/30/2020   TSH 3.51 05/02/2020   TSH 1.51 01/28/2020     Past Medical History:  Diagnosis Date   Arthritis    both hands;Dr.Deveshwar   Constipation due to pain medication    Diabetes mellitus without complication ([place])    Type II. Uses no medication. Pt controls with diet and weight   Enlarged prostate    Frequent urination at night    GERD (gastroesophageal reflux disease)    History of noncompliance with medical treatment, presenting hazards to health 11/20/2018   Hyperlipidemia associated with type 2 diabetes mellitus ([place]) 04/19/2007   Qualifier: Diagnosis of  By: Linna Darner MD, William     Hypertension    Hypothyroidism    Polio    as a child w/o complications   Snoring    no sleep apnea   Statin declined-  11/20/2018   patient understands risks associated with this decision and has been advised to restart by cardiologist as well as myself several times   Torn meniscus    Transfusion history 2012   post TKR    Past Surgical History:  Procedure Laterality Date   ANTERIOR LAT LUMBAR FUSION Right 11/03/2017   Procedure: Right Lumbar three-four Anterolateral lumbar interbody fusion with lateral plate;  Surgeon: Erline Levine, MD;  Location: [facility] [facility];  Service: Neurosurgery;  Laterality: Right;   BACK SURGERY  2011   hemiarthroplasty 01/11/1999 and 6   CARDIAC CATHETERIZATION  09/2001   negative   COLONOSCOPY     EYE SURGERY  2003   R&L cataract removed & IOL- 2003 & 2007,Dr.Jenkins   INGUINAL HERNIA REPAIR  2002   right, Dr. Truitt Leep   JOINT REPLACEMENT  2012   left knee replacement   LIPOMA EXCISION     back   MENISCECTOMY Bilateral    Dr. Noemi Chapel   PROSTATE SURGERY  07/2006   for urinary urgency, Dr. Everardo [place] CUFF REPAIR Right October, 2016   sinus & throat surgery-1995  1995    TONSILLECTOMY     TOTAL KNEE ARTHROPLASTY  01/11/2011   Procedure: TOTAL KNEE ARTHROPLASTY;  Surgeon: Rudean Haskell, MD;  Location: [facility];  Service: Orthopedics;  Laterality: Left;   TOTAL KNEE ARTHROPLASTY Right 11/22/2016   Procedure: TOTAL KNEE ARTHROPLASTY;  Surgeon: Vickey Huger, MD;  Location: [facility];  Service: Orthopedics;  Laterality: Right;   UVULECTOMY  8099   Dr. [place]   fracture- right   XI  ROBOTIC ASSISTED VENTRAL HERNIA N/A 09/07/2020   Procedure: XI ROBOTIC ASSISTED DIAGNOSTIC LAPAROSCOPY;  Surgeon: Jules Husbands, MD;  Location: [facility];  Service: General;  Laterality: N/A;    Family History  Problem Relation Age of Onset   Sudden death Father        accident   Coronary artery disease Mother    Emphysema Mother    Diverticulosis Mother    Thyroid disease Mother    Asthma Brother    Bone cancer Brother    Diabetes Paternal Grandmother    Sarcoidosis Son    Thyroid disease Daughter    Anesthesia problems Neg Hx    Hypotension Neg Hx    Malignant hyperthermia Neg Hx    Pseudochol deficiency Neg Hx    Colon cancer Neg Hx     Social History:  reports that he quit smoking about 55 years ago. His smoking use included cigarettes. He has a 18.00 pack-year smoking history. He has never been exposed to tobacco smoke. He has never used smokeless tobacco. He reports current alcohol use of about 4.0 standard drinks of alcohol per week. He reports that he does not use drugs.  Allergies:  Allergies  Allergen Reactions   Simvastatin Other (See Comments)    MYALGIAS   Other     Other reaction(s): Unknown Other reaction(s): Unknown   Oxycodone Other (See Comments)    Severe constipation    Allergies as of 11/23/2021       Reactions   Simvastatin Other (See Comments)   MYALGIAS   Other    Other reaction(s): Unknown Other reaction(s): Unknown   Oxycodone Other (See Comments)   Severe constipation        Medication List         Accurate as of November 23, 2021  9:33 PM. If you have any questions, ask your nurse or doctor.          acetaminophen 500 MG tablet Commonly known as: TYLENOL Take 1,000 mg by mouth every 6 (six) hours as needed for mild pain. Takes 2 tablets nightly   ARTIFICIAL TEARS OP Place 1 drop into both eyes 2 (two) times daily.   aspirin 81 MG tablet Take 81 mg by mouth every evening.   celecoxib 200 MG capsule Commonly known as: CELEBREX Take 200 mg by mouth 2 (two) times daily.   Clobetasol Propionate E 0.05 % emollient cream Generic drug: Clobetasol Prop Emollient Base as needed.   cyanocobalamin 1000 MCG tablet Commonly known as: VITAMIN B12 Take 1,000 mcg by mouth daily.   diclofenac Sodium 1 % Gel Commonly known as: VOLTAREN Apply 2 g topically at bedtime.   finasteride 5 MG tablet Commonly known as: PROSCAR Take 5 mg by mouth daily.   Fish Oil 1000 MG Caps Take 1,000 mg by mouth every evening.   fludrocortisone 0.1 MG tablet Commonly known as: FLORINEF 1 tablet 3 days a week   Iron 28 MG Tabs Take 1 each by mouth daily.   levothyroxine 137 MCG tablet Commonly known as: SYNTHROID Take 1 tablet (137 mcg total) by mouth daily before breakfast.   multivitamin with minerals Tabs tablet Take 1 tablet by mouth daily.   NONFORMULARY OR COMPOUNDED ITEM Glucometer (Precision XTRA- Model #[id number])   polyethylene glycol 17 g packet Commonly known as: MiraLax Take 17 g by mouth daily.   Restora Caps One tab qd   rosuvastatin 5 MG tablet Commonly known as: Crestor Take 1 tablet (5 mg  total) by mouth at bedtime.   tamsulosin 0.4 MG Caps capsule Commonly known as: FLOMAX Take 0.4 mg by mouth 2 (two) times daily.         Review of Systems   Previously on losartan 50 mg for reportedly kidney protection by PCP Losartan was stopped because of low normal blood pressure readings previously  He has a blood pressure monitor at home  Recent home  BP: 130/70, usually not high  Cortisol level has been normal  BP Readings from Last 3 Encounters:  11/23/21 124/68  10/20/21 116/68  08/20/21 106/66     He has history of mild diabetes with A1c as high as 7.1.  Also several years ago he did have blood sugars in the diabetic range  Glucose has been better controlled with last A1c 6.3 He feels that his blood sugars are fairly close to normal in the low 100 range fasting but not checking after meals He is using One Touch Verio monitor   Has not been treated with medications Has limited ability to exercise because of knee pain  Wt Readings from Last 3 Encounters:  11/23/21 186 lb 3.2 oz (84.5 kg)  10/20/21 186 lb (84.4 kg)  09/07/21 188 lb (85.3 kg)   Lab Results  Component Value Date   HGBA1C 6.9 (H) 11/09/2021   HGBA1C 6.3 (A) 06/17/2021   HGBA1C 6.0 (A) 12/17/2020   Lab Results  Component Value Date   MICROALBUR 10 04/14/2017   LDLCALC 47 09/16/2020   CREATININE 0.98 11/09/2021            Examination:    BP 124/68 (Patient Position: Standing)   Pulse 60   Ht '5\' 10"'$  (1.778 m)   Wt 186 lb 3.2 oz (84.5 kg)   BMI 26.72 kg/m   No lower leg edema present  Assessment:    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 since he has had a long history of hyponatremia and CT scan did not show tumor He is usually asymptomatic  Sodium levels have been as low as 125 He is now on a regimen of Florinef 3 days a week Again sodium is close to 130 He is tolerating the Florinef well without change in blood pressure, no edema or abnormal potassium  HYPOTHYROIDISM: Recent levels have been consistently normal on 137 mcg To check labs again on the next visit  History of hypertension: Blood pressure is excellent, lower on the second measurement including standing  PLAN:   No change in any of the medications as above  Since his A1c is increasing he needs to start checking some readings after meals although his level  of control is still adequate for his age   Elayne Snare 11/23/2021, 9:33 PM      Note: This office note was prepared with Dragon voice recognition system technology. Any transcriptional errors that result from this process are unintentional.

## 2021-11-23 NOTE — Patient Instructions (Signed)
Check blood sugars on waking up 1 days a week  Also check blood sugars about 2 hours after meals and do this after different meals by rotation  Recommended blood sugar levels on waking up are 90-130 and about 2 hours after meal is 130-180  Please bring your blood sugar monitor to each visit, thank you

## 2021-11-24 ENCOUNTER — Encounter (INDEPENDENT_AMBULATORY_CARE_PROVIDER_SITE_OTHER): Payer: Medicare Other | Admitting: Ophthalmology

## 2021-11-24 DIAGNOSIS — E113312 Type 2 diabetes mellitus with moderate nonproliferative diabetic retinopathy with macular edema, left eye: Secondary | ICD-10-CM | POA: Diagnosis not present

## 2021-11-24 DIAGNOSIS — E113391 Type 2 diabetes mellitus with moderate nonproliferative diabetic retinopathy without macular edema, right eye: Secondary | ICD-10-CM | POA: Diagnosis not present

## 2021-11-24 DIAGNOSIS — H43813 Vitreous degeneration, bilateral: Secondary | ICD-10-CM

## 2021-11-24 DIAGNOSIS — H33301 Unspecified retinal break, right eye: Secondary | ICD-10-CM | POA: Diagnosis not present

## 2021-11-24 NOTE — Progress Notes (Signed)
Subjective:  Patient ID: Kyle Simon, male    DOB: 1933-01-29,  MRN: [id number]  Chief Complaint  Patient presents with   Foot Pain    88 y.o. male presents with the above complaint.  Patient presents with complaint of right ankle pain.  Patient is that there is some swelling and discomfort.  Patient states he has a history of rheumatoid arthritis he wanted get it evaluated like an injection he is also diabetic but controls with diet and weight.  Denies any other acute issues.   Review of Systems: Negative except as noted in the HPI. Denies N/V/F/Ch.  Past Medical History:  Diagnosis Date   Arthritis    both hands;Dr.Deveshwar   Constipation due to pain medication    Diabetes mellitus without complication ([place])    Type II. Uses no medication. Pt controls with diet and weight   Enlarged prostate    Frequent urination at night    GERD (gastroesophageal reflux disease)    History of noncompliance with medical treatment, presenting hazards to health 11/20/2018   Hyperlipidemia associated with type 2 diabetes mellitus ([place]) 04/19/2007   Qualifier: Diagnosis of  By: Linna Darner MD, Gwyndolyn Saxon     Hypertension    Hypothyroidism    Polio    as a child w/o complications   Snoring    no sleep apnea   Statin declined-  11/20/2018   patient understands risks associated with this decision and has been advised to restart by cardiologist as well as myself several times   Torn meniscus    Transfusion history 2012   post TKR    Current Outpatient Medications:    acetaminophen (TYLENOL) 500 MG tablet, Take 1,000 mg by mouth every 6 (six) hours as needed for mild pain. Takes 2 tablets nightly, Disp: , Rfl:    aspirin 81 MG tablet, Take 81 mg by mouth every evening. , Disp: , Rfl:    celecoxib (CELEBREX) 200 MG capsule, Take 200 mg by mouth 2 (two) times daily., Disp: , Rfl:    CLOBETASOL PROPIONATE E 0.05 % emollient cream, as needed., Disp: , Rfl:    diclofenac Sodium (VOLTAREN) 1 % GEL, Apply  2 g topically at bedtime., Disp: , Rfl:    Ferrous Sulfate (IRON) 28 MG TABS, Take 1 each by mouth daily., Disp: , Rfl:    finasteride (PROSCAR) 5 MG tablet, Take 5 mg by mouth daily., Disp: , Rfl:    fludrocortisone (FLORINEF) 0.1 MG tablet, 1 tablet 3 days a week, Disp: 40 tablet, Rfl: 3   Hypromellose (ARTIFICIAL TEARS OP), Place 1 drop into both eyes 2 (two) times daily., Disp: , Rfl:    levothyroxine (SYNTHROID) 137 MCG tablet, Take 1 tablet (137 mcg total) by mouth daily before breakfast., Disp: 90 tablet, Rfl: 2   Multiple Vitamin (MULTIVITAMIN WITH MINERALS) TABS, Take 1 tablet by mouth daily., Disp: , Rfl:    NONFORMULARY OR COMPOUNDED ITEM, Glucometer (Precision XTRA- Model #[id number]), Disp: 1 each, Rfl: 0   Omega-3 Fatty Acids (FISH OIL) 1000 MG CAPS, Take 1,000 mg by mouth every evening., Disp: , Rfl:    polyethylene glycol (MIRALAX) 17 g packet, Take 17 g by mouth daily., Disp: 14 each, Rfl: 0   Probiotic Product (RESTORA) CAPS, One tab qd, Disp: 90 capsule, Rfl: 3   rosuvastatin (CRESTOR) 5 MG tablet, Take 1 tablet (5 mg total) by mouth at bedtime., Disp: 90 tablet, Rfl: 3   tamsulosin (FLOMAX) 0.4 MG CAPS capsule, Take 0.4  mg by mouth 2 (two) times daily., Disp: , Rfl:    vitamin B-12 (CYANOCOBALAMIN) 1000 MCG tablet, Take 1,000 mcg by mouth daily., Disp: , Rfl:   Social History   Tobacco Use  Smoking Status Former   Packs/day: 1.00   Years: 18.00   Total pack years: 18.00   Types: Cigarettes   Quit date: 03/01/1966   Years since quitting: 55.7   Passive exposure: Never  Smokeless Tobacco Never    Allergies  Allergen Reactions   Simvastatin Other (See Comments)    MYALGIAS   Other     Other reaction(s): Unknown Other reaction(s): Unknown   Oxycodone Other (See Comments)    Severe constipation   Objective:  There were no vitals filed for this visit. There is no height or weight on file to calculate BMI. Constitutional Well developed. Well nourished.   Vascular Dorsalis pedis pulses palpable bilaterally. Posterior tibial pulses palpable bilaterally. Capillary refill normal to all digits.  No cyanosis or clubbing noted. Pedal hair growth normal.  Neurologic Normal speech. Oriented to person, place, and time. Epicritic sensation to light touch grossly present bilaterally.  Dermatologic Nails well groomed and normal in appearance. No open wounds. No skin lesions.  Orthopedic: Pain on palpation of right ankle joint pain with range of motion of the joint deep intra-articular ankle pain noted mild crepitus clinically appreciated.  No pain at the ATFL ligament, Achilles tendon, peroneal tendon   Radiographs: None Assessment:   1. Capsulitis of right ankle    Plan:  Patient was evaluated and treated and all questions answered.  Right ankle capsulitis with underlying history of rheumatoid arthritis -All questions and concerns were discussed with the patient in extensive detail.  Given the amount of pain that he is having would benefit from a steroid injection of decrease acute inflammatory component associate with pain.  Patient agrees with plan like to proceed with steroid injection -A steroid injection was performed at Right ankle joint using 1% plain Lidocaine and 10 mg of Kenalog. This was well tolerated.   No follow-ups on file.

## 2021-11-26 ENCOUNTER — Ambulatory Visit (INDEPENDENT_AMBULATORY_CARE_PROVIDER_SITE_OTHER): Payer: Medicare Other | Admitting: Family Medicine

## 2021-11-26 ENCOUNTER — Encounter: Payer: Self-pay | Admitting: Family Medicine

## 2021-11-26 ENCOUNTER — Ambulatory Visit: Payer: Medicare Other | Admitting: Podiatry

## 2021-11-26 VITALS — BP 136/74 | HR 59 | Temp 98.6°F | Ht 70.0 in | Wt 184.0 lb

## 2021-11-26 DIAGNOSIS — R195 Other fecal abnormalities: Secondary | ICD-10-CM | POA: Diagnosis not present

## 2021-11-26 DIAGNOSIS — E1159 Type 2 diabetes mellitus with other circulatory complications: Secondary | ICD-10-CM

## 2021-11-26 DIAGNOSIS — E222 Syndrome of inappropriate secretion of antidiuretic hormone: Secondary | ICD-10-CM | POA: Diagnosis not present

## 2021-11-26 DIAGNOSIS — I152 Hypertension secondary to endocrine disorders: Secondary | ICD-10-CM

## 2021-11-26 DIAGNOSIS — R413 Other amnesia: Secondary | ICD-10-CM | POA: Diagnosis not present

## 2021-11-26 DIAGNOSIS — E1169 Type 2 diabetes mellitus with other specified complication: Secondary | ICD-10-CM | POA: Diagnosis not present

## 2021-11-26 DIAGNOSIS — E785 Hyperlipidemia, unspecified: Secondary | ICD-10-CM

## 2021-11-26 DIAGNOSIS — E039 Hypothyroidism, unspecified: Secondary | ICD-10-CM | POA: Diagnosis not present

## 2021-11-26 LAB — LIPID PANEL
Cholesterol: 128 mg/dL (ref 0–200)
HDL: 51.5 mg/dL (ref >39.00)
LDL Cholesterol: 58 mg/dL (ref 0–99)
NonHDL: 76.07
Total CHOL/HDL Ratio: 2
Triglycerides: 88 mg/dL (ref 0.0–149.0)
VLDL: 17.6 mg/dL (ref 0.0–40.0)

## 2021-11-26 NOTE — Assessment & Plan Note (Signed)
Notes intermittent hard stools as well as large watery bowel movements.  Clear trigger, he takes MiraLAX as needed.  He will do a food diary as well as a log of when he is having abnormal stools.  He notes this is happened since his hernia surgery.  Return in 4 to 6 weeks, GI referral in the event that symptoms persist.

## 2021-11-26 NOTE — Progress Notes (Signed)
Subjective:     Kyle Simon is a 88 y.o. male presenting for Follow-up (Talk about vaccines that he is due)     HPI  Memory - wife has noticed some memory issues - he is noticing some issue - some short term memory loss - wife is reminding him constantly  #Hyponatremia - seeing Dr. Dwyane Dee  Lab Results  Component Value Date   HGBA1C 6.9 (H) 11/09/2021   Diabetes is increasing No change in diet Will start checking sugars after meals  Knees - still very bad - using cane - no falls - moving ok overall  Has gotten dizzy a few times  No falls  Using the cane out of the house when having to move more  Has been taking 2 per day celebrex - does seem to help has gone back to twice daily  Vitamin b12 was high in 2020 Thyroid recently normal  GI symptoms - will get diarrhea and constipation - flucuating - taking miralax - now having small stools or occasionally clear stools - getting stool with urgency and liquid a few times a month w/o clear association   Review of Systems   Social History   Tobacco Use  Smoking Status Former   Packs/day: 1.00   Years: 18.00   Total pack years: 18.00   Types: Cigarettes   Quit date: 03/01/1966   Years since quitting: 55.7   Passive exposure: Never  Smokeless Tobacco Never        Objective:    BP Readings from Last 3 Encounters:  11/26/21 136/74  11/23/21 124/68  10/20/21 116/68   Wt Readings from Last 3 Encounters:  11/26/21 184 lb (83.5 kg)  11/23/21 186 lb 3.2 oz (84.5 kg)  10/20/21 186 lb (84.4 kg)    BP 136/74   Pulse (!) 59   Temp 98.6 F (37 C) (Oral)   Ht '5\' 10"'$  (1.778 m)   Wt 184 lb (83.5 kg)   SpO2 97%   BMI 26.40 kg/m    Physical Exam Constitutional:      Appearance: Normal appearance. He is not ill-appearing or diaphoretic.  HENT:     Right Ear: External ear normal.     Left Ear: External ear normal.     Nose: Nose normal.  Eyes:     General: No scleral icterus.    Extraocular  Movements: Extraocular movements intact.     Conjunctiva/sclera: Conjunctivae normal.  Cardiovascular:     Rate and Rhythm: Normal rate and regular rhythm.     Heart sounds: No murmur heard. Pulmonary:     Effort: Pulmonary effort is normal. No respiratory distress.     Breath sounds: Normal breath sounds. No wheezing.  Abdominal:     General: Abdomen is flat. Bowel sounds are normal. There is no distension.     Palpations: Abdomen is soft.     Tenderness: There is no abdominal tenderness. There is no guarding or rebound.  Musculoskeletal:     Cervical back: Neck supple.  Skin:    General: Skin is warm and dry.  Neurological:     Mental Status: He is alert. Mental status is at baseline.  Psychiatric:        Mood and Affect: Mood normal.           Assessment & Plan:   Problem List Items Addressed This Visit       Cardiovascular and Mediastinum   Hypertension associated with diabetes ([place]) - Primary (Chronic)  Controlled.  Not currently on medication and tolerating fludrocortisone without increase.        Endocrine   Hypothyroidism (Chronic)    stable on last check, continue levo 137 mcg.      Hyperlipidemia associated with type 2 diabetes mellitus ([facility]) (Chronic)   Relevant Orders   Lipid panel   Type 2 diabetes mellitus with other specified complication ([place])    Slightly elevated on last check, continue to manage with healthy diet.  Recheck in 3 months.      SIADH (syndrome of inappropriate ADH production) [facility])    Following with endocrinology, appreciate support.  Has been stable even with restarting Celebrex.  Continue Florinef 0.1 mg 3 times a week.        Other   Memory loss    6 CIT with a score of 4. Check B12, other labs within normal limits.  Courage words studies and continued activity.  Continue to monitor.      Relevant Orders   Vitamin B12   Abnormal stool caliber    Notes intermittent hard stools as well as large watery bowel movements.   Clear trigger, he takes MiraLAX as needed.  He will do a food diary as well as a log of when he is having abnormal stools.  He notes this is happened since his hernia surgery.  Return in 4 to 6 weeks, GI referral in the event that symptoms persist.      Relevant Orders   Ambulatory referral to Gastroenterology     Return in about 6 weeks (around 01/07/2022) for TOC with Tabitha for bowel f/u and hyponatremia.  Lesleigh Noe, MD

## 2021-11-26 NOTE — Assessment & Plan Note (Signed)
Slightly elevated on last check, continue to manage with healthy diet.  Recheck in 3 months.

## 2021-11-26 NOTE — Assessment & Plan Note (Signed)
Following with endocrinology, appreciate support.  Has been stable even with restarting Celebrex.  Continue Florinef 0.1 mg 3 times a week.

## 2021-11-26 NOTE — Patient Instructions (Signed)
Keep a food and stool  - Write down - hard stool or small pass stool -- day, when the last BM before that, as well as any change in stool - Track when you take miralax and when and what stool after for 24 hours - track when liquid stools occur - what you ate in the last 24 hours

## 2021-11-26 NOTE — Assessment & Plan Note (Signed)
6 CIT with a score of 4. Check B12, other labs within normal limits.  Courage words studies and continued activity.  Continue to monitor.

## 2021-11-26 NOTE — Assessment & Plan Note (Addendum)
stable on last check, continue levo 137 mcg.

## 2021-11-26 NOTE — Assessment & Plan Note (Signed)
Controlled.  Not currently on medication and tolerating fludrocortisone without increase.

## 2021-11-27 LAB — VITAMIN B12: Vitamin B-12: >1500 pg/mL — ABNORMAL HIGH (ref 211–911)

## 2021-12-09 NOTE — Progress Notes (Signed)
Cardiology Office Note   Date:  12/10/2021   ID:  Kyle Simon, DOB August 17, 1932, MRN [id number]  PCP:  Waunita Schooner, MD  Cardiologist:   Minus Breeding, MD   Chief Complaint  Patient presents with   Shortness of Breath     History of Present Illness: Kyle Simon is a 88 y.o. male who is referred by Waunita Schooner, MD for evaluation of SOB and abnormal EKG.   He had a low risk stress echo in 2012.  He had a distant cardiac cath.   He had dyspnea at a previous visit and I sent him for a [employer].  This was negative for ischemia.    Since I last saw him he has had no new overt cardiovascular problems.  He gets around more slowly because of balance and weakness and joint problems.  He is short of breath when he gets in the bed but he says it is just a hard thing for him to do.  He is not describing new resting shortness of breath, PND or orthopnea.  He is not describing new chest pressure, neck or arm discomfort.  He is not reporting any palpitations, presyncope or syncope.   Past Medical History:  Diagnosis Date   Arthritis    both hands;Dr.Deveshwar   Constipation due to pain medication    Diabetes mellitus without complication ([place])    Type II. Uses no medication. Pt controls with diet and weight   Enlarged prostate    Frequent urination at night    GERD (gastroesophageal reflux disease)    History of noncompliance with medical treatment, presenting hazards to health 11/20/2018   Hyperlipidemia associated with type 2 diabetes mellitus ([place]) 04/19/2007   Qualifier: Diagnosis of  By: Linna Darner MD, William     Hypertension    Hypothyroidism    Polio    as a child w/o complications   Snoring    no sleep apnea   Statin declined-  11/20/2018   patient understands risks associated with this decision and has been advised to restart by cardiologist as well as myself several times   Torn meniscus    Transfusion history 2012   post TKR    Past Surgical History:   Procedure Laterality Date   ANTERIOR LAT LUMBAR FUSION Right 11/03/2017   Procedure: Right Lumbar three-four Anterolateral lumbar interbody fusion with lateral plate;  Surgeon: Erline Levine, MD;  Location: [facility];  Service: Neurosurgery;  Laterality: Right;   BACK SURGERY  2011   hemiarthroplasty 01/11/1999 and 6   CARDIAC CATHETERIZATION  09/2001   negative   COLONOSCOPY     EYE SURGERY  2003   R&L cataract removed & IOL- 2003 & 2007,Dr.Jenkins   INGUINAL HERNIA REPAIR  2002   right, Dr. Truitt Leep   JOINT REPLACEMENT  2012   left knee replacement   LIPOMA EXCISION     back   MENISCECTOMY Bilateral    Dr. Noemi Chapel   PROSTATE SURGERY  07/2006   for urinary urgency, Dr. Everardo Pacific CUFF REPAIR Right October, 2016   sinus & throat surgery-1995  1995   TONSILLECTOMY     TOTAL KNEE ARTHROPLASTY  01/11/2011   Procedure: TOTAL KNEE ARTHROPLASTY;  Surgeon: Rudean Haskell, MD;  Location: [place];  Service: Orthopedics;  Laterality: Left;   TOTAL KNEE ARTHROPLASTY Right 11/22/2016   Procedure: TOTAL KNEE ARTHROPLASTY;  Surgeon: Vickey Huger, MD;  Location: [place];  Service: Orthopedics;  Laterality: Right;   UVULECTOMY  1829   Dr. Erik Obey   [place]   fracture- right   XI ROBOTIC ASSISTED VENTRAL HERNIA N/A 09/07/2020   Procedure: XI ROBOTIC ASSISTED DIAGNOSTIC LAPAROSCOPY;  Surgeon: Jules Husbands, MD;  Location: [facility];  Service: General;  Laterality: N/A;     Current Outpatient Medications  Medication Sig Dispense Refill   acetaminophen (TYLENOL) 500 MG tablet Take 1,000 mg by mouth every 6 (six) hours as needed for mild pain. Takes 2 tablets nightly     aspirin 81 MG tablet Take 81 mg by mouth every evening.      celecoxib (CELEBREX) 200 MG capsule Take 200 mg by mouth 2 (two) times daily.     CLOBETASOL PROPIONATE E 0.05 % emollient cream as needed.     diclofenac Sodium (VOLTAREN) 1 % GEL Apply 2 g topically at bedtime.     Ferrous Sulfate (IRON) 28  MG TABS Take 1 each by mouth daily.     finasteride (PROSCAR) 5 MG tablet Take 5 mg by mouth daily.     fludrocortisone (FLORINEF) 0.1 MG tablet 1 tablet 3 days a week 40 tablet 3   Hypromellose (ARTIFICIAL TEARS OP) Place 1 drop into both eyes 2 (two) times daily.     levothyroxine (SYNTHROID) 137 MCG tablet Take 1 tablet (137 mcg total) by mouth daily before breakfast. 90 tablet 2   Multiple Vitamin (MULTIVITAMIN WITH MINERALS) TABS Take 1 tablet by mouth daily.     NONFORMULARY OR COMPOUNDED ITEM Glucometer (Precision XTRA- Model #[id number]) 1 each 0   Omega-3 Fatty Acids (FISH OIL) 1000 MG CAPS Take 1,000 mg by mouth every evening.     polyethylene glycol (MIRALAX) 17 g packet Take 17 g by mouth daily. 14 each 0   Probiotic Product (RESTORA) CAPS One tab qd 90 capsule 3   rosuvastatin (CRESTOR) 5 MG tablet Take 1 tablet (5 mg total) by mouth at bedtime. 90 tablet 3   tamsulosin (FLOMAX) 0.4 MG CAPS capsule Take 0.4 mg by mouth 2 (two) times daily.     No current facility-administered medications for this visit.    Allergies:   Simvastatin, Other, and Oxycodone    ROS:  Please see the history of present illness.   Otherwise, review of systems are positive for none.   All other systems are reviewed and negative.    PHYSICAL EXAM: VS:  BP 138/82   Pulse (!) 53   Ht '5\' 10"'$  (1.778 m)   Wt 182 lb (82.6 kg)   SpO2 97%   BMI 26.11 kg/m  , BMI Body mass index is 26.11 kg/m. GENERAL:  Frail appearing NECK:  No jugular venous distention, waveform within normal limits, carotid upstroke brisk and symmetric, no bruits, no thyromegaly LUNGS:  Clear to auscultation bilaterally CHEST:  Unremarkable HEART:  PMI not displaced or sustained,S1 and S2 within normal limits, no S3, no S4, no clicks, no rubs, no murmurs ABD:  Flat, positive bowel sounds normal in frequency in pitch, no bruits, no rebound, no guarding, no midline pulsatile mass, no hepatomegaly, no splenomegaly EXT:  2 plus pulses  throughout, no edema, no cyanosis no clubbing  EKG:  EKG is  ordered today. The ekg ordered 12/10/2021 demonstrates sinus rhythm, rate 53, axis within normal limits, intervals within normal limits, premature ventricular contractions.   Recent Labs: 08/20/2021: Hemoglobin 12.0; Platelets 251.0 11/09/2021: BUN 14; Creatinine, Ser 0.98; Potassium 4.2; Sodium  130; TSH 3.17    Lipid Panel    Component Value Date/Time   CHOL 128 11/26/2021 1044   CHOL 119 02/16/2019 0914   TRIG 88.0 11/26/2021 1044   HDL 51.50 11/26/2021 1044   HDL 49 02/16/2019 0914   CHOLHDL 2 11/26/2021 1044   VLDL 17.6 11/26/2021 1044   LDLCALC 58 11/26/2021 1044   LDLCALC 49 02/16/2019 0914   LDLDIRECT 121 (H) 11/15/2018 1010   LDLDIRECT 61.6 04/25/2013 1431      Wt Readings from Last 3 Encounters:  12/10/21 182 lb (82.6 kg)  11/26/21 184 lb (83.5 kg)  11/23/21 186 lb 3.2 oz (84.5 kg)      Other studies Reviewed: Additional studies/ records that were reviewed today include: Labs Review of the above records demonstrates: See elsewhere   ASSESSMENT AND PLAN:   SOB:   This seems to be baseline.  I think there is nothing to suggest a sudden change or acute cardiac etiology.  I think he has progressive muscle loss and joint problems.  I think this is contributory.  I have suggested that they look into [facility] and that he try physical therapy or water aerobics and if his breathing gets worse with this to let me know.   HTN:  The blood pressure is at target. No change in medications is indicated. We will continue with therapeutic lifestyle changes (TLC).  DM: A1c was 6.9 which is down from 7.1.  No change in therapy.   CORONARY CALCIUM:   He has had negative perfusion study and no new overt symptoms.  He needs continued risk reduction.  AORTIC ATHEROSCLEROSIS: We talked about this today.  As above.  DYSLIPIDEMIA:    His LDL was 58 with an HDL of 51.  No change in therapy.    Current medicines are  reviewed at length with the patient today.  The patient does not have concerns regarding medicines.  The following changes have been made: None  Labs/ tests ordered today include: None  Orders Placed This Encounter  Procedures   EKG 12-Lead      Disposition:   FU with me 6 months at the patient's request   Signed, Minus Breeding, MD  12/10/2021 11:54 AM    [place]

## 2021-12-10 ENCOUNTER — Encounter: Payer: Self-pay | Admitting: Cardiology

## 2021-12-10 ENCOUNTER — Ambulatory Visit: Payer: Medicare Other | Attending: Cardiology | Admitting: Cardiology

## 2021-12-10 VITALS — BP 138/82 | HR 53 | Ht 70.0 in | Wt 182.0 lb

## 2021-12-10 DIAGNOSIS — I700 Atherosclerosis of aorta: Secondary | ICD-10-CM | POA: Diagnosis not present

## 2021-12-10 DIAGNOSIS — R931 Abnormal findings on diagnostic imaging of heart and coronary circulation: Secondary | ICD-10-CM | POA: Insufficient documentation

## 2021-12-10 DIAGNOSIS — R0602 Shortness of breath: Secondary | ICD-10-CM | POA: Insufficient documentation

## 2021-12-10 DIAGNOSIS — E785 Hyperlipidemia, unspecified: Secondary | ICD-10-CM

## 2021-12-10 DIAGNOSIS — E118 Type 2 diabetes mellitus with unspecified complications: Secondary | ICD-10-CM | POA: Diagnosis not present

## 2021-12-10 DIAGNOSIS — I10 Essential (primary) hypertension: Secondary | ICD-10-CM | POA: Insufficient documentation

## 2021-12-10 NOTE — Patient Instructions (Signed)
Medication Instructions:  Your physician recommends that you continue on your current medications as directed. Please refer to the Current Medication list given to you today.  *If you need a refill on your cardiac medications before your next appointment, please call your pharmacy*   Lab Work: NONE ordered at this time of appointment  If you have labs (blood work) drawn today and your tests are completely normal, you will receive your results only by: [place] (if you have MyChart) OR A paper copy in the mail If you have any lab test that is abnormal or we need to change your treatment, we will call you to review the results.   Testing/Procedures: NONE ordered at this time of appointment    Follow-Up: At [facility], you and your health needs are our priority.  As part of our continuing mission to provide you with exceptional heart care, we have created designated Provider Care Teams.  These Care Teams include your primary Cardiologist (physician) and Advanced Practice Providers (APPs -  Physician Assistants and Nurse Practitioners) who all work together to provide you with the care you need, when you need it.  We recommend signing up for the patient portal called "MyChart".  Sign up information is provided on this After Visit Summary.  MyChart is used to connect with patients for Virtual Visits (Telemedicine).  Patients are able to view lab/test results, encounter notes, upcoming appointments, etc.  Non-urgent messages can be sent to your provider as well.   To learn more about what you can do with MyChart, go to [email or web address].    Your next appointment:   1 year(s)  The format for your next appointment:   In Person  Provider:   Minus Breeding, MD      Important Information About Sugar

## 2021-12-17 ENCOUNTER — Encounter: Payer: Medicare Other | Admitting: Family Medicine

## 2021-12-17 DIAGNOSIS — R3914 Feeling of incomplete bladder emptying: Secondary | ICD-10-CM | POA: Diagnosis not present

## 2021-12-17 DIAGNOSIS — N401 Enlarged prostate with lower urinary tract symptoms: Secondary | ICD-10-CM | POA: Diagnosis not present

## 2021-12-25 NOTE — Progress Notes (Addendum)
Office Visit Note  Patient: Kyle Simon             Date of Birth: [birth year]           MRN: [id number]             PCP: Gweneth Dimitri, MD Referring: Gweneth Dimitri, MD Visit Date: 01/06/2022 Occupation: @GUAROCC @  Subjective:  Right ankle pain  History of Present Illness: Kyle Simon is a 88 y.o. male with history of osteoarthritis, degenerative disc disease and osteoporosis.  He states he continues to have pain and discomfort in his right ankle joint.  He was having discomfort in his left ankle at the last visit.  He was recently evaluated by Dr. Allena Katz who diagnosed him with capsulitis.  He also has some neuropathic pain.  He had cortisone injection by Dr. Allena Katz which gave him some relief.  He states he tried using compression socks but they are difficult for him to put on.  He continues to have pain and discomfort in his bilateral knee joints.  His knee joints are replaced.  He mobilizes with the help of a cane.  He has discomfort in his right shoulder joint and limited range of motion.  He continues to have lower back pain.  He does not recall when he had his last bone DEXA scan.  Activities of Daily Living:  Patient reports morning stiffness for all day. Patient Denies nocturnal pain.  Difficulty dressing/grooming: Reports Difficulty climbing stairs: Reports Difficulty getting out of chair: Reports Difficulty using hands for taps, buttons, cutlery, and/or writing: Reports  Review of Systems  Constitutional:  Positive for fatigue.  HENT:  Negative for mouth sores and mouth dryness.   Eyes:  Positive for dryness.  Respiratory:  Negative for difficulty breathing.   Cardiovascular:  Negative for chest pain and palpitations.  Gastrointestinal:  Positive for constipation and diarrhea. Negative for blood in stool.  Endocrine: Negative for increased urination.  Genitourinary:  Negative for involuntary urination.  Musculoskeletal:  Positive for gait problem, muscle weakness and  morning stiffness. Negative for joint pain, joint pain, joint swelling, myalgias, muscle tenderness and myalgias.  Skin:  Negative for color change, rash and sensitivity to sunlight.  Allergic/Immunologic: Negative for susceptible to infections.  Neurological:  Negative for dizziness and headaches.  Hematological:  Negative for swollen glands.  Psychiatric/Behavioral:  Negative for depressed mood and sleep disturbance. The patient is not nervous/anxious.     PMFS History:  Patient Active Problem List   Diagnosis Date Noted   Memory loss 11/26/2021   Abnormal stool caliber 11/26/2021   Balance problems 12/17/2020   Chronic pain of left ankle 12/17/2020   Dyslipidemia 11/30/2020   SBO (small bowel obstruction) (HCC) 09/06/2020   Hyperkalemia 02/05/2020   Shortness of breath 02/05/2020   Thoracic aortic aneurysm (HCC) 12/10/2019   Educated about COVID-19 virus infection 11/29/2019   SIADH (syndrome of inappropriate ADH production) (HCC) 03/21/2019   Flatulence 11/29/2018   Elevated coronary artery calcium score 11/25/2018   History of noncompliance with medical treatment, presenting hazards to health 11/20/2018   Aortic atherosclerosis (HCC) 10/05/2017   Fatigue 08/22/2017   Chronic pain of left knee 08/22/2017   Stressful life event affecting family-loss on May 31st 08/22/2017   Inactivity-recently due to knee pain 08/22/2017   Lumbar radiculopathy 06/16/2017   Total knee replacement status, right 04/14/2017   DM assoc with CKD (chronic kidney disease), stage III (HCC) 04/14/2017   Arthralgia of right temporomandibular joint 03/02/2017  Chronic eczematous otitis externa of both ears 03/02/2017   Presbycusis of both ears 03/02/2017   H/O total knee replacement, bilateral 11/22/2016   ETD (Eustachian tube dysfunction), right 09/15/2016   Strain of masseter muscle 09/15/2016   Trochanteric bursitis of right hip 09/07/2016   Age-related osteoporosis without current pathological  fracture 07/16/2016   History of lumbar fusion 07/16/2016   History of total knee arthroplasty, left 07/16/2016   Cough 01/04/2016   High risk medications (not anticoagulants) long-term use 01/04/2016   Hypomagnesemia 12/29/2015   h/o Hyponatremia 12/29/2015   Cerumen impaction 08/25/2015   Constipation 08/06/2015   Type 2 diabetes mellitus with other specified complication (HCC) 08/05/2015   Flatulence symptom 07/25/2015   Diarrhea 06/21/2015   Chronic rhinitis 11/01/2014   Hypertension associated with diabetes (HCC) 03/20/2014   Spondylolisthesis of lumbar region 10/23/2013   Diverticulosis of colon without hemorrhage 01/22/2013   B12 deficiency anemia 01/22/2013   Anemia, unspecified 08/13/2012   Synovial cyst of lumbar spine 07/26/2011   Spasms of the hands or feet 07/26/2011   GERD (gastroesophageal reflux disease) 03/26/2011   SENILE KERATOSIS 06/12/2008   Hyperlipidemia associated with type 2 diabetes mellitus (HCC) 04/19/2007   SNORING, HX OF 04/19/2007   BPH (benign prostatic hyperplasia) 04/18/2007   Hypothyroidism 04/20/2006    Past Medical History:  Diagnosis Date   Arthritis    both hands;Dr.Sanjit Mcmichael   Constipation due to pain medication    Diabetes mellitus without complication (HCC)    Type II. Uses no medication. Pt controls with diet and weight   Enlarged prostate    Frequent urination at night    GERD (gastroesophageal reflux disease)    History of noncompliance with medical treatment, presenting hazards to health 11/20/2018   Hyperlipidemia associated with type 2 diabetes mellitus (HCC) 04/19/2007   Qualifier: Diagnosis of  By: Alwyn Ren MD, William     Hypertension    Hypothyroidism    Polio    as a child w/o complications   Snoring    no sleep apnea   Statin declined-  11/20/2018   patient understands risks associated with this decision and has been advised to restart by cardiologist as well as myself several times   Torn meniscus    Transfusion  history 2012   post TKR    Family History  Problem Relation Age of Onset   Sudden death Father        accident   Coronary artery disease Mother    Emphysema Mother    Diverticulosis Mother    Thyroid disease Mother    Asthma Brother    Bone cancer Brother    Diabetes Paternal Grandmother    Sarcoidosis Son    Thyroid disease Daughter    Anesthesia problems Neg Hx    Hypotension Neg Hx    Malignant hyperthermia Neg Hx    Pseudochol deficiency Neg Hx    Colon cancer Neg Hx    Past Surgical History:  Procedure Laterality Date   ANTERIOR LAT LUMBAR FUSION Right 11/03/2017   Procedure: Right Lumbar three-four Anterolateral lumbar interbody fusion with lateral plate;  Surgeon: Maeola Harman, MD;  Location: [facility] OR;  Service: Neurosurgery;  Laterality: Right;   BACK SURGERY  2011   hemiarthroplasty 01/11/1999 and 6   CARDIAC CATHETERIZATION  09/2001   negative   COLONOSCOPY     EYE SURGERY  2003   R&L cataract removed & IOL- 2003 & 2007,Dr.Jenkins   INGUINAL HERNIA REPAIR  2002   right,  Dr. Luan Pulling   JOINT REPLACEMENT  2012   left knee replacement   LIPOMA EXCISION     back   MENISCECTOMY Bilateral    Dr. Thurston Hole   PROSTATE SURGERY  07/2006   for urinary urgency, Dr. Tiana Loft CUFF REPAIR Right October, 2016   sinus & throat surgery-1995  1995   TONSILLECTOMY     TOTAL KNEE ARTHROPLASTY  01/11/2011   Procedure: TOTAL KNEE ARTHROPLASTY;  Surgeon: Raymon Mutton, MD;  Location: [facility] OR;  Service: Orthopedics;  Laterality: Left;   TOTAL KNEE ARTHROPLASTY Right 11/22/2016   Procedure: TOTAL KNEE ARTHROPLASTY;  Surgeon: Dannielle Huh, MD;  Location: [facility] OR;  Service: Orthopedics;  Laterality: Right;   UVULECTOMY  1996   Dr. 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      Family: 5 children (one passed away) 2 stepchildren, 14 grandchildren, 9 great-grandchildren - Museum/gallery conservator and granddaughter nearby otherwise across the country       Enjoys: fly and play golf, reading      Exercise: walking the dog   Diet: diabetic diet      Safety   Seat belts: Yes    Guns: Yes  and secure   Safe in relationships: Yes    Immunization History  Administered Date(s) Administered   Fluad Quad(high Dose 65+) 12/05/2019   Influenza Whole 01/09/2002   Influenza, High Dose Seasonal PF 11/24/2018, 02/08/2019   Influenza,inj,Quad PF,6+ Mos 11/29/2012, 11/29/2013   Influenza-Unspecified 11/30/2014, 11/22/2015, 01/12/2018   MMR 07/12/2017   PFIZER(Purple Top)SARS-COV-2 Vaccination 03/11/2019, 04/01/2019, 11/07/2019, 06/18/2020   Pfizer Covid-19 Vaccine Bivalent Booster 32yrs & up 12/31/2020   Pneumococcal Conjugate-13 03/20/2014   Pneumococcal Polysaccharide-23 03/30/1996, 02/13/2010   Td 03/30/1996, 04/26/2007   Td (Adult) 03/30/1996, 04/26/2007   Tdap 07/24/2018   Zoster Recombinat (Shingrix) 07/24/2018, 11/24/2018, 02/08/2019     Objective: Vital Signs: BP (!) 167/81 (BP Location: Left Arm, Patient Position: Standing, Cuff Size: Normal)   Pulse 62   Resp 12   Ht 5\' 10"  (1.778 m)   Wt 183 lb (83 kg)   BMI 26.26 kg/m    Physical Exam Vitals and nursing note reviewed.  Constitutional:      Appearance: He is well-developed.  HENT:     Head: Normocephalic and atraumatic.  Eyes:     Conjunctiva/sclera: Conjunctivae normal.     Pupils: Pupils are equal, round, and reactive to light.  Cardiovascular:     Rate and Rhythm: Normal rate and regular rhythm.     Heart sounds: Normal heart sounds.  Pulmonary:     Effort: Pulmonary effort is normal.      Breath sounds: Normal breath sounds.  Abdominal:     General: Bowel sounds are normal.     Palpations: Abdomen is soft.  Musculoskeletal:     Cervical back: Normal range of motion and neck supple.  Skin:    General: Skin is warm and dry.     Capillary Refill: Capillary refill takes less than 2 seconds.  Neurological:     Mental Status: He is alert and oriented to person, place, and time.  Psychiatric:        Behavior: Behavior normal.      Musculoskeletal Exam: He had limited lateral rotation of the cervical spine.  Right shoulder and abduction was limited to 90 degrees.  Elbow joints, wrist joints with good range of motion.  He had bilateral PIP and DIP thickening.  Hip joints with good range of motion.  Bilateral knee joints were replaced with some limited extension.  He had no synovitis over ankle joints.  He had no tenderness on palpation over ankle joints or MTP joints.  CDAI Exam: CDAI Score: -- Patient Global: --; Provider Global: -- Swollen: --; Tender: -- Joint Exam 01/06/2022   No joint exam has been documented for this visit   There is currently no information documented on the homunculus. Go to the Rheumatology activity and complete the homunculus joint exam.  Investigation: No additional findings.  Imaging: No results found.  Recent Labs: Lab Results  Component Value Date   WBC 6.4 08/20/2021   HGB 12.0 (L) 08/20/2021   PLT 251.0 08/20/2021   NA 130 (L) 11/09/2021   K 4.2 11/09/2021   CL 97 11/09/2021   CO2 24 11/09/2021   GLUCOSE 108 (H) 11/09/2021   BUN 14 11/09/2021   CREATININE 0.98 11/09/2021   BILITOT 1.7 (H) 09/06/2020   ALKPHOS 75 09/06/2020   AST 24 09/06/2020   ALT 18 09/06/2020   PROT 8.1 09/06/2020   ALBUMIN 4.3 09/06/2020   CALCIUM 9.1 11/09/2021   GFRAA 85 03/21/2019    Speciality Comments: No specialty comments available.  Procedures:  No procedures performed Allergies: Simvastatin, Other, and Oxycodone   Assessment / Plan:      Visit Diagnoses: Chronic pain of right ankle-he noticed some improvement in the ankle joint pain after the injection.  I reviewed records from Dr. Eliane Decree visit.  He was evaluated by Dr. Allena Katz who diagnosed him with right ankle capsulitis.  All autoimmune work-up was negative.  Labs obtained at the last visit including uric acid, rheumatoid factor were negative.  Sed rate was mildly elevated not significant.  Pedal edema-improved.  Use of compression socks was discussed.  H/O total knee replacement, bilateral -he continues to have bilateral knee joint discomfort.  He had right total knee replacement 11/22/2016 by Dr. Valentina Gu, left total knee replacement 2012 by Dr. Valentina Gu.  He feels unstable when he walks.  He tries to walk without a cane.  Using a cane on a regular basis was emphasized.  I also discussed possible use of a walker when he is out and about.  Lower extremity muscle strengthening exercises were discussed.  Chronic right shoulder pain-he had limited range of motion of his right shoulder joint and has chronic discomfort.  Postural kyphosis of thoracic region-stretching exercises were discussed.  History of lumbar fusion-he had limited range of motion and discomfort.  DDD (degenerative disc disease), lumbar-chronic discomfort.  Age-related osteoporosis without current pathological fracture - 05/27/2015 DEXA T score -2.5 right radius.  11/13/18: T-score -2.7 BMD 0.672 right distal radius.  Patient is currently not taking any treatment.  He does not recall when his last bone density was.  He has an appointment with the PCP tomorrow.  I advised him to discuss DEXA scan and possible treatment for osteoporosis.  He may benefit from subcutaneous Prolia.  Other medical problems are listed as follows:  History of anemia  History of  diabetes mellitus  History of gastroesophageal reflux (GERD)  History of hyperlipidemia  History of hypertension-patient blood pressure was elevated.  Repeat blood  pressure was again elevated at 167/81.  He was advised to monitor blood pressure closely at home and follow-up with his PCP.  He has an appointment tomorrow.  Aortic atherosclerosis (HCC)  Type 2 diabetes mellitus with other specified complication, unspecified whether long term insulin use (HCC)  Diverticulosis of colon without hemorrhage  Orders: No orders of the defined types were placed in this encounter.  No orders of the defined types were placed in this encounter.    Follow-Up Instructions: Return if symptoms worsen or fail to improve, for Osteoarthritis.   Pollyann Savoy, MD  Note - This record has been created using Animal nutritionist.  Chart creation errors have been sought, but may not always  have been located. Such creation errors do not reflect on  the standard of medical care.

## 2021-12-30 ENCOUNTER — Ambulatory Visit: Payer: Medicare Other | Admitting: Podiatry

## 2021-12-31 ENCOUNTER — Ambulatory Visit (INDEPENDENT_AMBULATORY_CARE_PROVIDER_SITE_OTHER): Payer: Medicare Other | Admitting: Podiatry

## 2021-12-31 ENCOUNTER — Encounter: Payer: Self-pay | Admitting: Podiatry

## 2021-12-31 DIAGNOSIS — M7751 Other enthesopathy of right foot: Secondary | ICD-10-CM

## 2021-12-31 DIAGNOSIS — M792 Neuralgia and neuritis, unspecified: Secondary | ICD-10-CM

## 2021-12-31 NOTE — Progress Notes (Signed)
Subjective:  Patient ID: Kyle Simon, male    DOB: 08-27-1932,  MRN: [id number]  Chief Complaint  Patient presents with   Peripheral Neuropathy    "How can I tell if I have Neuropathy?"   Foot Pain    "I think I need injections in both ankles again.  The right one is starting to hurt again."   Nail Problem    "It may be time to cut my toenails."    88 y.o. male presents with the above complaint.  Patient presents for follow-up of right ankle pain.  Patient states the injection helped considerably.  He would like to have another injection.  He would like to know if he is having some neuropathy or neuropathic pain.  He wanted it evaluated.  Review of Systems: Negative except as noted in the HPI. Denies N/V/F/Ch.  Past Medical History:  Diagnosis Date   Arthritis    both hands;Dr.Deveshwar   Constipation due to pain medication    Diabetes mellitus without complication ([place])    Type II. Uses no medication. Pt controls with diet and weight   Enlarged prostate    Frequent urination at night    GERD (gastroesophageal reflux disease)    History of noncompliance with medical treatment, presenting hazards to health 11/20/2018   Hyperlipidemia associated with type 2 diabetes mellitus ([place]) 04/19/2007   Qualifier: Diagnosis of  By: Linna Darner MD, Gwyndolyn Saxon     Hypertension    Hypothyroidism    Polio    as a child w/o complications   Snoring    no sleep apnea   Statin declined-  11/20/2018   patient understands risks associated with this decision and has been advised to restart by cardiologist as well as myself several times   Torn meniscus    Transfusion history 2012   post TKR    Current Outpatient Medications:    acetaminophen (TYLENOL) 500 MG tablet, Take 1,000 mg by mouth every 6 (six) hours as needed for mild pain. Takes 2 tablets nightly, Disp: , Rfl:    aspirin 81 MG tablet, Take 81 mg by mouth every evening. , Disp: , Rfl:    celecoxib (CELEBREX) 200 MG capsule, Take 200 mg  by mouth 2 (two) times daily., Disp: , Rfl:    CLOBETASOL PROPIONATE E 0.05 % emollient cream, as needed., Disp: , Rfl:    diclofenac Sodium (VOLTAREN) 1 % GEL, Apply 2 g topically at bedtime., Disp: , Rfl:    Ferrous Sulfate (IRON) 28 MG TABS, Take 1 each by mouth daily., Disp: , Rfl:    finasteride (PROSCAR) 5 MG tablet, Take 5 mg by mouth daily., Disp: , Rfl:    fludrocortisone (FLORINEF) 0.1 MG tablet, 1 tablet 3 days a week, Disp: 40 tablet, Rfl: 3   Hypromellose (ARTIFICIAL TEARS OP), Place 1 drop into both eyes 2 (two) times daily., Disp: , Rfl:    levothyroxine (SYNTHROID) 137 MCG tablet, Take 1 tablet (137 mcg total) by mouth daily before breakfast., Disp: 90 tablet, Rfl: 2   Multiple Vitamin (MULTIVITAMIN WITH MINERALS) TABS, Take 1 tablet by mouth daily., Disp: , Rfl:    NONFORMULARY OR COMPOUNDED ITEM, Glucometer (Precision XTRA- Model #[id number]), Disp: 1 each, Rfl: 0   Omega-3 Fatty Acids (FISH OIL) 1000 MG CAPS, Take 1,000 mg by mouth every evening., Disp: , Rfl:    polyethylene glycol (MIRALAX) 17 g packet, Take 17 g by mouth daily., Disp: 14 each, Rfl: 0   Probiotic Product (RESTORA)  CAPS, One tab qd, Disp: 90 capsule, Rfl: 3   rosuvastatin (CRESTOR) 5 MG tablet, Take 1 tablet (5 mg total) by mouth at bedtime., Disp: 90 tablet, Rfl: 3   tamsulosin (FLOMAX) 0.4 MG CAPS capsule, Take 0.4 mg by mouth 2 (two) times daily., Disp: , Rfl:   Social History   Tobacco Use  Smoking Status Former   Packs/day: 1.00   Years: 18.00   Total pack years: 18.00   Types: Cigarettes   Quit date: 03/01/1966   Years since quitting: 55.8   Passive exposure: Never  Smokeless Tobacco Never    Allergies  Allergen Reactions   Simvastatin Other (See Comments)    MYALGIAS   Other     Other reaction(s): Unknown Other reaction(s): Unknown   Oxycodone Other (See Comments)    Severe constipation   Objective:  There were no vitals filed for this visit. There is no height or weight on file to  calculate BMI. Constitutional Well developed. Well nourished.  Vascular Dorsalis pedis pulses palpable bilaterally. Posterior tibial pulses palpable bilaterally. Capillary refill normal to all digits.  No cyanosis or clubbing noted. Pedal hair growth normal.  Neurologic Normal speech. Oriented to person, place, and time. Epicritic sensation to light touch grossly present bilaterally.  Normal nerve exam noted with Semmes monofilament testing  Dermatologic Nails well groomed and normal in appearance. No open wounds. No skin lesions.  Orthopedic: Pain on palpation of right ankle joint pain with range of motion of the joint deep intra-articular ankle pain noted mild crepitus clinically appreciated.  No pain at the ATFL ligament, Achilles tendon, peroneal tendon   Radiographs: None Assessment:   1. Neuropathic pain   2. Capsulitis of right ankle     Plan:  Patient was evaluated and treated and all questions answered.  Neuropathic pain versus neuropathy -I explained to the patient that currently he does not have any neuropathy.  He may be experiencing some neuropathic pain or overall he has excellent sensation to both of his feet.  Right ankle capsulitis with underlying history of rheumatoid arthritis -All questions and concerns were discussed with the patient in extensive detail.  Given the amount of pain that he is having would benefit from a steroid injection of decrease acute inflammatory component associate with pain.  Patient agrees with plan like to proceed with steroid injection -A second steroid injection was performed at Right ankle joint using 1% plain Lidocaine and 10 mg of Kenalog. This was well tolerated.   No follow-ups on file.

## 2022-01-05 ENCOUNTER — Other Ambulatory Visit: Payer: Self-pay | Admitting: Endocrinology

## 2022-01-05 ENCOUNTER — Encounter (INDEPENDENT_AMBULATORY_CARE_PROVIDER_SITE_OTHER): Payer: Medicare Other | Admitting: Ophthalmology

## 2022-01-05 DIAGNOSIS — E113312 Type 2 diabetes mellitus with moderate nonproliferative diabetic retinopathy with macular edema, left eye: Secondary | ICD-10-CM | POA: Diagnosis not present

## 2022-01-05 DIAGNOSIS — E113291 Type 2 diabetes mellitus with mild nonproliferative diabetic retinopathy without macular edema, right eye: Secondary | ICD-10-CM

## 2022-01-05 DIAGNOSIS — H43813 Vitreous degeneration, bilateral: Secondary | ICD-10-CM

## 2022-01-06 ENCOUNTER — Ambulatory Visit: Payer: Medicare Other | Attending: Rheumatology | Admitting: Rheumatology

## 2022-01-06 ENCOUNTER — Encounter: Payer: Self-pay | Admitting: Rheumatology

## 2022-01-06 VITALS — BP 167/81 | HR 62 | Resp 12 | Ht 70.0 in | Wt 183.0 lb

## 2022-01-06 DIAGNOSIS — Z8639 Personal history of other endocrine, nutritional and metabolic disease: Secondary | ICD-10-CM | POA: Insufficient documentation

## 2022-01-06 DIAGNOSIS — M5136 Other intervertebral disc degeneration, lumbar region: Secondary | ICD-10-CM | POA: Insufficient documentation

## 2022-01-06 DIAGNOSIS — Z8679 Personal history of other diseases of the circulatory system: Secondary | ICD-10-CM | POA: Insufficient documentation

## 2022-01-06 DIAGNOSIS — Z96653 Presence of artificial knee joint, bilateral: Secondary | ICD-10-CM | POA: Insufficient documentation

## 2022-01-06 DIAGNOSIS — I700 Atherosclerosis of aorta: Secondary | ICD-10-CM | POA: Diagnosis not present

## 2022-01-06 DIAGNOSIS — M25511 Pain in right shoulder: Secondary | ICD-10-CM | POA: Insufficient documentation

## 2022-01-06 DIAGNOSIS — Z8719 Personal history of other diseases of the digestive system: Secondary | ICD-10-CM | POA: Insufficient documentation

## 2022-01-06 DIAGNOSIS — M25571 Pain in right ankle and joints of right foot: Secondary | ICD-10-CM | POA: Diagnosis not present

## 2022-01-06 DIAGNOSIS — R600 Localized edema: Secondary | ICD-10-CM | POA: Diagnosis not present

## 2022-01-06 DIAGNOSIS — Z862 Personal history of diseases of the blood and blood-forming organs and certain disorders involving the immune mechanism: Secondary | ICD-10-CM | POA: Insufficient documentation

## 2022-01-06 DIAGNOSIS — E1169 Type 2 diabetes mellitus with other specified complication: Secondary | ICD-10-CM

## 2022-01-06 DIAGNOSIS — M4004 Postural kyphosis, thoracic region: Secondary | ICD-10-CM | POA: Insufficient documentation

## 2022-01-06 DIAGNOSIS — M810 Age-related osteoporosis without current pathological fracture: Secondary | ICD-10-CM | POA: Insufficient documentation

## 2022-01-06 DIAGNOSIS — G8929 Other chronic pain: Secondary | ICD-10-CM | POA: Diagnosis not present

## 2022-01-06 DIAGNOSIS — Z981 Arthrodesis status: Secondary | ICD-10-CM | POA: Diagnosis not present

## 2022-01-06 DIAGNOSIS — R931 Abnormal findings on diagnostic imaging of heart and coronary circulation: Secondary | ICD-10-CM

## 2022-01-06 DIAGNOSIS — K5730 Diverticulosis of large intestine without perforation or abscess without bleeding: Secondary | ICD-10-CM | POA: Diagnosis not present

## 2022-01-07 ENCOUNTER — Telehealth: Payer: Self-pay | Admitting: *Deleted

## 2022-01-07 ENCOUNTER — Encounter: Payer: Self-pay | Admitting: Family

## 2022-01-07 ENCOUNTER — Ambulatory Visit (INDEPENDENT_AMBULATORY_CARE_PROVIDER_SITE_OTHER): Payer: Medicare Other | Admitting: Family

## 2022-01-07 VITALS — BP 168/70 | HR 62 | Temp 98.0°F | Resp 16 | Ht 70.0 in | Wt 183.2 lb

## 2022-01-07 DIAGNOSIS — K5900 Constipation, unspecified: Secondary | ICD-10-CM | POA: Diagnosis not present

## 2022-01-07 DIAGNOSIS — E039 Hypothyroidism, unspecified: Secondary | ICD-10-CM | POA: Diagnosis not present

## 2022-01-07 DIAGNOSIS — M810 Age-related osteoporosis without current pathological fracture: Secondary | ICD-10-CM

## 2022-01-07 DIAGNOSIS — E222 Syndrome of inappropriate secretion of antidiuretic hormone: Secondary | ICD-10-CM | POA: Diagnosis not present

## 2022-01-07 DIAGNOSIS — M25562 Pain in left knee: Secondary | ICD-10-CM

## 2022-01-07 DIAGNOSIS — M25572 Pain in left ankle and joints of left foot: Secondary | ICD-10-CM | POA: Diagnosis not present

## 2022-01-07 DIAGNOSIS — G8929 Other chronic pain: Secondary | ICD-10-CM

## 2022-01-07 DIAGNOSIS — K5909 Other constipation: Secondary | ICD-10-CM

## 2022-01-07 DIAGNOSIS — M859 Disorder of bone density and structure, unspecified: Secondary | ICD-10-CM | POA: Diagnosis not present

## 2022-01-07 MED ORDER — POLYETHYLENE GLYCOL 3350 17 G PO PACK: 17.0000 g | PACK | Freq: Every day | ORAL | 0 refills | Status: DC | PRN

## 2022-01-07 NOTE — Telephone Encounter (Signed)
-----   Message from Bo Merino, MD sent at 01/07/2022  1:20 PM EST ----- Please ask patient if he wants [place] to schedule DEXA scan we can do it for him.  Then he will need a follow-up appointment to discuss DEXA scan and treatment plan.  Thank you, SD ----- Message ----- From: Eugenia Pancoast, FNP Sent: 01/07/2022  10:11 AM EST To: Bo Merino, MD  Good morning, I recently saw our mutual patient in the office inquiring about a bone density. He has not per our records had a bone density in the last five years, if ever. I did just inherit him as my patient at our [facility] appointment today, however I checked external records as well with no findings of recent DEXA if you wanted to have it completed.

## 2022-01-07 NOTE — Patient Instructions (Signed)
  I have ordered imaging for you at [facility] outpatient diagnostic center. This order has been sent over for you electronically.  Please call [phone] to schedule this appointment. (This is for bone density)  Welcome to our clinic, I am happy to have you as my new patient. I am excited to continue on this healthcare journey with you.  Stop by the lab prior to leaving today. I will notify you of your results once received.   Please keep in mind Any my chart messages you send have up to a three business day turnaround for a response.  Phone calls may take up to a one full business day turnaround for a  response.   If you need a medication refill I recommend you request it through the pharmacy as this is easiest for [place] rather than sending a message and or phone call.   Due to recent changes in healthcare laws, you may see results of your imaging and/or laboratory studies on MyChart before I have had a chance to review them.  I understand that in some cases there may be results that are confusing or concerning to you. Please understand that not all results are received at the same time and often I may need to interpret multiple results in order to provide you with the best plan of care or course of treatment. Therefore, I ask that you please give me 2 business days to thoroughly review all your results before contacting my office for clarification. Should we see a critical lab result, you will be contacted sooner.   It was a pleasure seeing you today! Please do not hesitate to reach out with any questions and or concerns.  Regards,   Eugenia Pancoast FNP-C

## 2022-01-07 NOTE — Progress Notes (Signed)
Established Patient Office Visit  Subjective:  Patient ID: Kyle Simon, male    DOB: 09/24/1932  Age: 88 y.o. MRN: [id number]  CC:  Chief Complaint  Patient presents with   Transitions Of Care    HPI Kyle Simon is here for a transition of care visit.  Prior provider was: Dr. Waunita Schooner, MD  Pt is without acute concerns.   DEXA: unsure when last was done. Rheumatologist was inquiring when maybe her last dexa was. Rheumatology does worry that he is at risk for fracture, and has had some increased falls in the last year   chronic concerns:  Elevated b12 on last lab work >1500 , stopped b12 since  Hypothyroid: does see endo for this. Does state takes his levothyroxine with his other medications states has done this for 20 years.   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 CT did not show tumor.   Osteoporosis without current pathological fracture: bone density last per rheumatology record 2017, t score -2.5 right radius, and wanted [place] to discuss possible treatment for osteoporosis and repeat dexa. Possible subq prolia.    Past Medical History:  Diagnosis Date   Arthritis    both hands;Dr.Deveshwar   Constipation due to pain medication    Diabetes mellitus without complication ([place])    Type II. Uses no medication. Pt controls with diet and weight   Enlarged prostate    Frequent urination at night    GERD (gastroesophageal reflux disease)    History of noncompliance with medical treatment, presenting hazards to health 11/20/2018   Hyperlipidemia associated with type 2 diabetes mellitus ([place]) 04/19/2007   Qualifier: Diagnosis of  By: Linna Darner MD, William     Hypertension    Hypothyroidism    Polio    as a child w/o complications   Snoring    no sleep apnea   Statin declined-  11/20/2018   patient understands risks associated with this decision and has been advised to restart by cardiologist as well as myself several times   Torn meniscus     Transfusion history 2012   post TKR    Past Surgical History:  Procedure Laterality Date   ANTERIOR LAT LUMBAR FUSION Right 11/03/2017   Procedure: Right Lumbar three-four Anterolateral lumbar interbody fusion with lateral plate;  Surgeon: Erline Levine, MD;  Location: [facility];  Service: Neurosurgery;  Laterality: Right;   BACK SURGERY  2011   hemiarthroplasty 01/11/1999 and 6   CARDIAC CATHETERIZATION  09/2001   negative   COLONOSCOPY     EYE SURGERY  2003   R&L cataract removed & IOL- 2003 & 2007,Dr.Jenkins   INGUINAL HERNIA REPAIR  2002   right, Dr. Truitt Leep   JOINT REPLACEMENT  2012   left knee replacement   LIPOMA EXCISION     back   MENISCECTOMY Bilateral    Dr. Noemi Chapel   PROSTATE SURGERY  07/2006   for urinary urgency, Dr. Everardo Pacific CUFF REPAIR Right October, 2016   sinus & throat surgery-1995  1995   TONSILLECTOMY     TOTAL KNEE ARTHROPLASTY  01/11/2011   Procedure: TOTAL KNEE ARTHROPLASTY;  Surgeon: Rudean Haskell, MD;  Location: [facility];  Service: Orthopedics;  Laterality: Left;   TOTAL KNEE ARTHROPLASTY Right 11/22/2016   Procedure: TOTAL KNEE ARTHROPLASTY;  Surgeon: Vickey Huger, MD;  Location: [facility];  Service: Orthopedics;  Laterality: Right;   UVULECTOMY  2671   Dr. Erik Obey   VASECTOMY  WRIST SURGERY  1959   fracture- right   XI ROBOTIC ASSISTED VENTRAL HERNIA N/A 09/07/2020   Procedure: XI ROBOTIC ASSISTED DIAGNOSTIC LAPAROSCOPY;  Surgeon: Jules Husbands, MD;  Location: [facility];  Service: General;  Laterality: N/A;    Family History  Problem Relation Age of Onset   Coronary artery disease Mother    Emphysema Mother    Diverticulosis Mother    Thyroid disease Mother    Sudden death Father        accident   Asthma Brother    Bone cancer Brother    Thyroid disease Daughter    Sarcoidosis Son    Diabetes Paternal Grandmother    Anesthesia problems Neg Hx    Hypotension Neg Hx    Malignant hyperthermia Neg Hx    Pseudochol deficiency Neg Hx     Colon cancer Neg Hx     Social History   Socioeconomic History   Marital status: Married    Spouse name: June   Number of children: 7   Years of education: 2 years college   Highest education level: Not on file  Occupational History   Occupation: retired  Tobacco Use   Smoking status: Former    Packs/day: 1.00    Years: 18.00    Total pack years: 18.00    Types: Cigarettes    Quit date: 03/01/1966    Years since quitting: 55.9    Passive exposure: Never   Smokeless tobacco: Never  Vaping Use   Vaping Use: Never used  Substance and Sexual Activity   Alcohol use: Yes    Alcohol/week: 4.0 standard drinks of alcohol    Types: 4 Shots of liquor per week    Comment: social, few drinks a week   Drug use: No   Sexual activity: Yes    Birth control/protection: Post-menopausal, Surgical  Other Topics Concern   Not on file  Social History Narrative   09/20/19   From: [place]   Living: with wife June (1985)   Work: retired from Social research officer, government, 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 [place]      Family: 5 children (one passed away) 2 stepchildren, 14 grandchildren, 9 great-grandchildren - Psychiatrist and granddaughter nearby otherwise across the country       Enjoys: fly and play golf, reading      Exercise: walking the dog   Diet: diabetic diet      Safety   Seat belts: Yes    Guns: Yes  and secure   Safe in relationships: Yes    Social Determinants of Radio broadcast assistant Strain: Low Risk  (09/07/2021)   Overall Financial Resource Strain (CARDIA)    Difficulty of Paying Living Expenses: Not hard at all  Food Insecurity: No Food Insecurity (09/07/2021)   Hunger Vital Sign    Worried About Running Out of Food in the Last Year: Never true    [place] in the Last Year: Never true  Transportation Needs: No Transportation Needs (09/07/2021)   PRAPARE - Hydrologist (Medical): No    Lack of Transportation (Non-Medical):  No  Physical Activity: Insufficiently Active (09/07/2021)   Exercise Vital Sign    Days of Exercise per Week: 2 days    Minutes of Exercise per Session: 20 min  Stress: No Stress Concern Present (09/07/2021)   [place]    Feeling of Stress : Not at  all  Social Connections: Moderately Integrated (09/07/2021)   Social Connection and Isolation Panel [NHANES]    Frequency of Communication with Friends and Family: Once a week    Frequency of Social Gatherings with Friends and Family: More than three times a week    Attends Religious Services: More than 4 times per year    Active Member of [employer] or Organizations: No    Attends Archivist Meetings: Never    Marital Status: Married  Human resources officer Violence: Not At Risk (09/07/2021)   Humiliation, Afraid, Rape, and Kick questionnaire    Fear of Current or Ex-Partner: No    Emotionally Abused: No    Physically Abused: No    Sexually Abused: No    Outpatient Medications Prior to Visit  Medication Sig Dispense Refill   acetaminophen (TYLENOL) 500 MG tablet Take 1,000 mg by mouth every 6 (six) hours as needed for mild pain. Takes 2 tablets nightly     aspirin 81 MG tablet Take 81 mg by mouth every evening.      celecoxib (CELEBREX) 200 MG capsule Take 200 mg by mouth 2 (two) times daily.     CLOBETASOL PROPIONATE E 0.05 % emollient cream as needed.     diclofenac Sodium (VOLTAREN) 1 % GEL Apply 2 g topically at bedtime.     Ferrous Sulfate (IRON) 28 MG TABS Take 1 each by mouth daily.     finasteride (PROSCAR) 5 MG tablet Take 5 mg by mouth daily.     fludrocortisone (FLORINEF) 0.1 MG tablet 1 tablet 3 days a week 40 tablet 3   Hypromellose (ARTIFICIAL TEARS OP) Place 1 drop into both eyes 2 (two) times daily.     Multiple Vitamin (MULTIVITAMIN WITH MINERALS) TABS Take 1 tablet by mouth daily.     Omega-3 Fatty Acids (FISH OIL) 1000 MG CAPS Take 1,000 mg by mouth every  evening.     Probiotic Product (RESTORA) CAPS One tab qd 90 capsule 3   rosuvastatin (CRESTOR) 5 MG tablet Take 1 tablet (5 mg total) by mouth at bedtime. 90 tablet 3   SYNTHROID 137 MCG tablet TAKE 1 TABLET DAILY BEFORE BREAKFAST 90 tablet 3   tamsulosin (FLOMAX) 0.4 MG CAPS capsule Take 0.4 mg by mouth 2 (two) times daily.     polyethylene glycol (MIRALAX) 17 g packet Take 17 g by mouth daily. 14 each 0   NONFORMULARY OR COMPOUNDED ITEM Glucometer (Precision XTRA- Model #XCC [id number]) 1 each 0   No facility-administered medications prior to visit.    Allergies  Allergen Reactions   Simvastatin Other (See Comments)    MYALGIAS   Oxycodone Other (See Comments)    Severe constipation    ROS Review of Systems  Review of Systems  Respiratory:  Negative for shortness of breath.   Cardiovascular:  Negative for chest pain and palpitations.  Gastrointestinal:  Negative for constipation and diarrhea.  Genitourinary:  Negative for dysuria, frequency and urgency.  Musculoskeletal:  Negative for myalgias.  Psychiatric/Behavioral:  Negative for depression and suicidal ideas.   All other systems reviewed and are negative.    Objective:    Physical Exam  Gen: NAD, resting comfortably CV: RRR with no murmurs appreciated Pulm: NWOB, CTAB with no crackles, wheezes, or rhonchi Skin: warm, dry Psych: Normal affect and thought content  BP (!) 168/70   Pulse 62   Temp 98 F (36.7 C)   Resp 16   Ht '5\' 10"'$  (1.778 m)   Wt  183 lb 4 oz (83.1 kg)   SpO2 100%   BMI 26.29 kg/m  Wt Readings from Last 3 Encounters:  01/07/22 183 lb 4 oz (83.1 kg)  01/06/22 183 lb (83 kg)  12/10/21 182 lb (82.6 kg)     Health Maintenance Due  Topic Date Due   COVID-19 Vaccine (6 - Pfizer risk series) 02/25/2021   OPHTHALMOLOGY EXAM  03/07/2021    There are no preventive care reminders to display for this patient.  Lab Results  Component Value Date   TSH 3.17 11/09/2021   Lab Results  Component  Value Date   WBC 6.4 08/20/2021   HGB 12.0 (L) 08/20/2021   HCT 36.0 (L) 08/20/2021   MCV 91.7 08/20/2021   PLT 251.0 08/20/2021   Lab Results  Component Value Date   NA 130 (L) 11/09/2021   K 4.2 11/09/2021   CO2 24 11/09/2021   GLUCOSE 108 (H) 11/09/2021   BUN 14 11/09/2021   CREATININE 0.98 11/09/2021   BILITOT 1.7 (H) 09/06/2020   ALKPHOS 75 09/06/2020   AST 24 09/06/2020   ALT 18 09/06/2020   PROT 8.1 09/06/2020   ALBUMIN 4.3 09/06/2020   CALCIUM 9.1 11/09/2021   ANIONGAP 5 09/09/2020   GFR 69.72 11/09/2021   Lab Results  Component Value Date   CHOL 128 11/26/2021   Lab Results  Component Value Date   HDL 51.50 11/26/2021   Lab Results  Component Value Date   LDLCALC 58 11/26/2021   Lab Results  Component Value Date   TRIG 88.0 11/26/2021   Lab Results  Component Value Date   CHOLHDL 2 11/26/2021   Lab Results  Component Value Date   HGBA1C 6.9 (H) 11/09/2021      Assessment & Plan:   Problem List Items Addressed This Visit       Endocrine   Hypothyroidism (Chronic)    Continue levothyroxine and f/u with endo as scheduled.       SIADH (syndrome of inappropriate ADH production) (HCC)    Stable sodium level, chronic. Maintained on florinef. Continue f/u with endo as scheduled.        Musculoskeletal and Integument   Age-related osteoporosis without current pathological fracture    Ordering repeat bone density pending results  F/u with rheumatology as scheduled.       Relevant Orders   DG Bone Density   Disorder of bone density and structure, unspecified     Other   Constipation    Chronic, refill sent for miralax      Chronic pain of left knee   Chronic pain of left ankle   Other Visit Diagnoses     Chronic constipation    -  Primary   Relevant Medications   polyethylene glycol (MIRALAX) 17 g packet       Meds ordered this encounter  Medications   polyethylene glycol (MIRALAX) 17 g packet    Sig: Take 17 g by mouth  daily as needed for mild constipation or moderate constipation.    Dispense:  14 each    Refill:  0    Order Specific Question:   Supervising Provider    Answer:   Diona Browner, AMY E [[id number]]    Follow-up: Return in about 3 months (around 04/09/2022) for f/u osteoporosis.    Eugenia Pancoast, FNP

## 2022-01-07 NOTE — Telephone Encounter (Signed)
Attempted to contact the patient and left message for patient to call the office.  

## 2022-01-11 NOTE — Telephone Encounter (Signed)
Spoke with patient and Eugenia Pancoast, FNP has placed the order for the bone density scan. Patient has the appointment scheduled for 03/18/2022. Patient with follow up with Tabitha for results.

## 2022-01-15 DIAGNOSIS — M859 Disorder of bone density and structure, unspecified: Secondary | ICD-10-CM | POA: Insufficient documentation

## 2022-01-15 NOTE — Assessment & Plan Note (Signed)
Chronic, refill sent for miralax

## 2022-01-15 NOTE — Assessment & Plan Note (Signed)
Continue levothyroxine and f/u with endo as scheduled.

## 2022-01-15 NOTE — Assessment & Plan Note (Signed)
Stable sodium level, chronic. Maintained on florinef. Continue f/u with endo as scheduled.

## 2022-01-15 NOTE — Assessment & Plan Note (Signed)
Ordering repeat bone density pending results  F/u with rheumatology as scheduled.

## 2022-03-16 ENCOUNTER — Encounter (INDEPENDENT_AMBULATORY_CARE_PROVIDER_SITE_OTHER): Payer: Medicare Other | Admitting: Ophthalmology

## 2022-03-16 DIAGNOSIS — H33301 Unspecified retinal break, right eye: Secondary | ICD-10-CM | POA: Diagnosis not present

## 2022-03-16 DIAGNOSIS — E113312 Type 2 diabetes mellitus with moderate nonproliferative diabetic retinopathy with macular edema, left eye: Secondary | ICD-10-CM

## 2022-03-16 DIAGNOSIS — E113391 Type 2 diabetes mellitus with moderate nonproliferative diabetic retinopathy without macular edema, right eye: Secondary | ICD-10-CM

## 2022-03-16 DIAGNOSIS — H43813 Vitreous degeneration, bilateral: Secondary | ICD-10-CM

## 2022-03-18 ENCOUNTER — Ambulatory Visit
Admission: RE | Admit: 2022-03-18 | Discharge: 2022-03-18 | Disposition: A | Discharge status: Home / Self Care | Payer: Medicare Other | Source: Ambulatory Visit | Attending: Family | Admitting: Family

## 2022-03-18 DIAGNOSIS — M810 Age-related osteoporosis without current pathological fracture: Secondary | ICD-10-CM | POA: Insufficient documentation

## 2022-03-18 DIAGNOSIS — M8589 Other specified disorders of bone density and structure, multiple sites: Secondary | ICD-10-CM | POA: Diagnosis not present

## 2022-03-23 ENCOUNTER — Telehealth: Payer: Self-pay | Admitting: Gastroenterology

## 2022-03-23 ENCOUNTER — Telehealth: Payer: Self-pay

## 2022-03-23 DIAGNOSIS — M238X1 Other internal derangements of right knee: Secondary | ICD-10-CM | POA: Diagnosis not present

## 2022-03-23 DIAGNOSIS — Z96653 Presence of artificial knee joint, bilateral: Secondary | ICD-10-CM | POA: Diagnosis not present

## 2022-03-23 DIAGNOSIS — R6889 Other general symptoms and signs: Secondary | ICD-10-CM | POA: Diagnosis not present

## 2022-03-23 NOTE — Telephone Encounter (Signed)
Patient saw Dr. Ardis Hughs at [facility] on 10/24/2019. Need to find out if he is transferring care  and if he is get Dr. Marius Ditch approval.

## 2022-03-23 NOTE — Telephone Encounter (Signed)
Please advise if you are okay with seeing patient

## 2022-03-23 NOTE — Telephone Encounter (Signed)
Pt is requesting transfering care from [facility] because he is changing residence. Pt has an appointment sched with our office on 04/07/2022

## 2022-03-23 NOTE — Telephone Encounter (Signed)
Per Katha Cabal Pt is requesting transfering care from [facility] because he is changing residence. Pt has an appointment sched with our office on 04/07/2022

## 2022-03-25 ENCOUNTER — Other Ambulatory Visit (INDEPENDENT_AMBULATORY_CARE_PROVIDER_SITE_OTHER): Payer: Medicare Other

## 2022-03-25 DIAGNOSIS — E063 Autoimmune thyroiditis: Secondary | ICD-10-CM | POA: Diagnosis not present

## 2022-03-25 DIAGNOSIS — E119 Type 2 diabetes mellitus without complications: Secondary | ICD-10-CM | POA: Diagnosis not present

## 2022-03-25 DIAGNOSIS — E871 Hypo-osmolality and hyponatremia: Secondary | ICD-10-CM

## 2022-03-25 LAB — BASIC METABOLIC PANEL
BUN: 17 mg/dL (ref 6–23)
CO2: 26 mEq/L (ref 19–32)
Calcium: 9.2 mg/dL (ref 8.4–10.5)
Chloride: 93 mEq/L — ABNORMAL LOW (ref 96–112)
Creatinine, Ser: 0.77 mg/dL (ref 0.40–1.50)
GFR: 80.74 mL/min (ref >60.00)
Glucose, Bld: 170 mg/dL — ABNORMAL HIGH (ref 70–99)
Potassium: 4.5 mEq/L (ref 3.5–5.1)
Sodium: 128 mEq/L — ABNORMAL LOW (ref 135–145)

## 2022-03-25 LAB — TSH: TSH: 0.45 u[IU]/mL (ref 0.35–5.50)

## 2022-03-25 LAB — T4, FREE: Free T4: 1.46 ng/dL (ref 0.60–1.60)

## 2022-03-25 LAB — HEMOGLOBIN A1C: Hgb A1c MFr Bld: 6.9 % — ABNORMAL HIGH (ref 4.6–6.5)

## 2022-03-26 NOTE — Telephone Encounter (Signed)
error 

## 2022-03-29 ENCOUNTER — Encounter: Payer: Self-pay | Admitting: Endocrinology

## 2022-03-29 ENCOUNTER — Ambulatory Visit (INDEPENDENT_AMBULATORY_CARE_PROVIDER_SITE_OTHER): Payer: Medicare Other | Admitting: Endocrinology

## 2022-03-29 VITALS — BP 140/60 | HR 103 | Ht 70.0 in | Wt 184.2 lb

## 2022-03-29 DIAGNOSIS — E063 Autoimmune thyroiditis: Secondary | ICD-10-CM

## 2022-03-29 DIAGNOSIS — E871 Hypo-osmolality and hyponatremia: Secondary | ICD-10-CM

## 2022-03-29 NOTE — Progress Notes (Incomplete)
Patient ID: Kyle Simon, male   DOB: November 21, 1932, 89 y.o.   MRN: [id number]            Reason for Appointment: Endocrinology follow-up visit    History of Present Illness:   HYPONATREMIA:  Background history:  Serum sodium has been low since at least 2011 and has been as low as 125 in the past More recently lowest reading 127 He has not had any associated symptoms of decreased appetite, nausea or drowsiness Not on any thiazide diuretics or SSRI drugs Has had negative endocrine work-up for cortisol deficiencies  Has a history of drinking a lot of water partly for his constipation  Recent history: His urine osmolality was last high at 551 Was also high at 442 on initial evaluation Urine sodium was over 90 previously  He was previously told to cut back on his fluid intake especially coffee He is usually drinking only 2 cup of coffee   With a trial of salt tablets his sodium did not improve, it was still down to 126 in 9/21 Also was having nausea from taking more than 1 regular tablet daily  Initially sodium was significantly better after starting DEMECLOCYCLINE 150 mg daily in 9/21 However this was stopped when his sodium decreased on the treatment down to 126  He was empirically started on FLORINEF in April 2023 because of persistently low sodium, mild increase in potassium and history of relatively high urine sodium Also had a potassium of 5.3 at baseline  He is now taking this 3 times a week  Sodium is now  compared to 126 in April Again subjectively he did not feel any different with improvement of his sodium He thinks he is at some difficulty with balance related to joint and foot issues  No swelling of the ankles with Florinef  He has been consistent with taking his Florinef now  CT scan of chest did not show any tumor  Lab Results  Component Value Date   NA 128 (L) 03/25/2022   K 4.5 03/25/2022   CL 93 (L) 03/25/2022   CO2 26 03/25/2022      Hypothyroidism was first diagnosed  around 2005 years ago   At the time of diagnosis patient was not having symptoms of  fatigue, cold sensitivity, difficulty concentrating, dry skin, weight gain Likely had only done routine testing and was told that he was hypothyroid and started him on unknown dose of levothyroxine  He did not start feeling any different when he started taking levothyroxine initially Also usually does not feel any different with dosage adjustments that have been made Apparently his dose was 150 g for some time but this was reduced in 06/2015 when his TSH was low normal Since then he has been taking 150 g 5 days a week and 125 g twice a week on-insulin initial consultation he was complaining that since spring time his energy level has been not as good and he has not been able to do as much with activities as he used to   He was referred here because his TSH is tending to be high but also his free T4 was relatively higher in 7/18  Recent history: On his initial consultation since his TSH was still relatively high and he was taking the equivalent of about 142 g of levothyroxine daily he was switched to taking 150 g once daily  He is taking his levothyroxine regularly before breakfast every morning The dose has been gradually decreased the last  2 times  He is now on 137 mcg levothyroxine consistently Has no unusual fatigue TSH again normal    Patient's weight history is as follows:  Wt Readings from Last 3 Encounters:  03/29/22 184 lb 4 oz (83.6 kg)  01/07/22 183 lb 4 oz (83.1 kg)  01/06/22 183 lb (83 kg)    Thyroid function results have been as follows:  Lab Results  Component Value Date   TSH 0.45 03/25/2022   TSH 3.17 11/09/2021   TSH 0.61 08/20/2021   TSH 0.44 06/15/2021   FREET4 1.46 03/25/2022   FREET4 1.22 11/09/2021   FREET4 1.60 08/20/2021   FREET4 1.24 06/15/2021   T3FREE 3.2 12/28/2016   T3FREE 2.7 10/17/2015    Lab Results   Component Value Date   TSH 0.45 03/25/2022   TSH 3.17 11/09/2021   TSH 0.61 08/20/2021   TSH 0.44 06/15/2021   TSH 0.10 (L) 04/21/2021   TSH 0.05 (L) 02/16/2021   TSH 1.02 10/13/2020   TSH 0.55 06/30/2020   TSH 3.51 05/02/2020     Past Medical History:  Diagnosis Date  . Arthritis    both hands;Dr.Deveshwar  . Constipation due to pain medication   . Diabetes mellitus without complication ([place])    Type II. Uses no medication. Pt controls with diet and weight  . Enlarged prostate   . Frequent urination at night   . GERD (gastroesophageal reflux disease)   . History of noncompliance with medical treatment, presenting hazards to health 11/20/2018  . Hyperlipidemia associated with type 2 diabetes mellitus ([place]) 04/19/2007   Qualifier: Diagnosis of  By: Linna Darner MD, Gwyndolyn Saxon    . Hypertension   . Hypothyroidism   . Polio    as a child w/o complications  . Snoring    no sleep apnea  . Statin declined-  11/20/2018   patient understands risks associated with this decision and has been advised to restart by cardiologist as well as myself several times  . Torn meniscus   . Transfusion history 2012   post TKR    Past Surgical History:  Procedure Laterality Date  . ANTERIOR LAT LUMBAR FUSION Right 11/03/2017   Procedure: Right Lumbar three-four Anterolateral lumbar interbody fusion with lateral plate;  Surgeon: Erline Levine, MD;  Location: [facility];  Service: Neurosurgery;  Laterality: Right;  . BACK SURGERY  2011   hemiarthroplasty 01/11/1999 and 6  . CARDIAC CATHETERIZATION  09/2001   negative  . COLONOSCOPY    . EYE SURGERY  2003   R&L cataract removed & IOL- 2003 & 2007,Dr.Jenkins  . INGUINAL HERNIA REPAIR  2002   right, Dr. Truitt Leep  . JOINT REPLACEMENT  2012   left knee replacement  . LIPOMA EXCISION     back  . MENISCECTOMY Bilateral    Dr. Noemi Chapel  . PROSTATE SURGERY  07/2006   for urinary urgency, Dr. Risa Grill  . ROTATOR CUFF REPAIR Right October, 2016  . sinus &  throat surgery-1995  1995  . TONSILLECTOMY    . TOTAL KNEE ARTHROPLASTY  01/11/2011   Procedure: TOTAL KNEE ARTHROPLASTY;  Surgeon: Rudean Haskell, MD;  Location: [facility];  Service: Orthopedics;  Laterality: Left;  . TOTAL KNEE ARTHROPLASTY Right 11/22/2016   Procedure: TOTAL KNEE ARTHROPLASTY;  Surgeon: Vickey Huger, MD;  Location: [facility];  Service: Orthopedics;  Laterality: Right;  . UVULECTOMY  [id number]   Dr. Erik Obey  . VASECTOMY    . WRIST SURGERY  1959   fracture- right  .  XI ROBOTIC ASSISTED VENTRAL HERNIA N/A 09/07/2020   Procedure: XI ROBOTIC ASSISTED DIAGNOSTIC LAPAROSCOPY;  Surgeon: Jules Husbands, MD;  Location: [facility];  Service: General;  Laterality: N/A;    Family History  Problem Relation Age of Onset  . Coronary artery disease Mother   . Emphysema Mother   . Diverticulosis Mother   . Thyroid disease Mother   . Sudden death Father        accident  . Asthma Brother   . Bone cancer Brother   . Thyroid disease Daughter   . Sarcoidosis Son   . Diabetes Paternal Grandmother   . Anesthesia problems Neg Hx   . Hypotension Neg Hx   . Malignant hyperthermia Neg Hx   . Pseudochol deficiency Neg Hx   . Colon cancer Neg Hx     Social History:  reports that he quit smoking about 56 years ago. His smoking use included cigarettes. He has a 18.00 pack-year smoking history. He has never been exposed to tobacco smoke. He has never used smokeless tobacco. He reports current alcohol use of about 4.0 standard drinks of alcohol per week. He reports that he does not use drugs.  Allergies:  Allergies  Allergen Reactions  . Simvastatin Other (See Comments)    MYALGIAS  . Oxycodone Other (See Comments)    Severe constipation    Allergies as of 03/29/2022       Reactions   Simvastatin Other (See Comments)   MYALGIAS   Oxycodone Other (See Comments)   Severe constipation        Medication List        Accurate as of March 29, 2022  1:51 PM. If you have any questions, ask your  nurse or doctor.          acetaminophen 500 MG tablet Commonly known as: TYLENOL Take 1,000 mg by mouth every 6 (six) hours as needed for mild pain. Takes 2 tablets nightly   ARTIFICIAL TEARS OP Place 1 drop into both eyes 2 (two) times daily.   aspirin 81 MG tablet Take 81 mg by mouth every evening.   celecoxib 200 MG capsule Commonly known as: CELEBREX Take 200 mg by mouth 2 (two) times daily.   Clobetasol Propionate E 0.05 % emollient cream Generic drug: Clobetasol Prop Emollient Base as needed.   diclofenac Sodium 1 % Gel Commonly known as: VOLTAREN Apply 2 g topically at bedtime.   finasteride 5 MG tablet Commonly known as: PROSCAR Take 5 mg by mouth daily.   Fish Oil 1000 MG Caps Take 1,000 mg by mouth every evening.   fludrocortisone 0.1 MG tablet Commonly known as: FLORINEF 1 tablet 3 days a week   Iron 28 MG Tabs Take 1 each by mouth daily.   multivitamin with minerals Tabs tablet Take 1 tablet by mouth daily.   polyethylene glycol 17 g packet Commonly known as: MiraLax Take 17 g by mouth daily as needed for mild constipation or moderate constipation.   Restora Caps One tab qd   rosuvastatin 5 MG tablet Commonly known as: Crestor Take 1 tablet (5 mg total) by mouth at bedtime.   Synthroid 137 MCG tablet Generic drug: levothyroxine TAKE 1 TABLET DAILY BEFORE BREAKFAST   tamsulosin 0.4 MG Caps capsule Commonly known as: FLOMAX Take 0.4 mg by mouth 2 (two) times daily.         Review of Systems   Previously on losartan 50 mg for reportedly kidney protection by PCP Losartan was  stopped because of low normal blood pressure readings previously  He has a blood pressure monitor at home  Recent home BP: 130/70, usually not high  Cortisol level has been normal  BP Readings from Last 3 Encounters:  03/29/22 (!) 140/60  01/07/22 (!) 168/70  01/06/22 (!) 167/81     He has history of mild diabetes with A1c as high as 7.1.  Also several  years ago he did have blood sugars in the diabetic range  Glucose has been better controlled with last A1c 6.3 He feels that his blood sugars are fairly close to normal in the low 100 range fasting but not checking after meals He is using One Touch Verio monitor   Has not been treated with medications Has limited ability to exercise because of knee pain  Wt Readings from Last 3 Encounters:  03/29/22 184 lb 4 oz (83.6 kg)  01/07/22 183 lb 4 oz (83.1 kg)  01/06/22 183 lb (83 kg)   Lab Results  Component Value Date   HGBA1C 6.9 (H) 03/25/2022   HGBA1C 6.9 (H) 11/09/2021   HGBA1C 6.3 (A) 06/17/2021   Lab Results  Component Value Date   MICROALBUR 10 04/14/2017   LDLCALC 58 11/26/2021   CREATININE 0.77 03/25/2022           Examination:    BP (!) 140/60   Pulse (!) 103   Ht '5\' 10"'$  (1.778 m)   Wt 184 lb 4 oz (83.6 kg)   BMI 26.44 kg/m   No lower leg edema present  Assessment:    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 since he has had a long history of hyponatremia and CT scan did not show tumor He is usually asymptomatic  Sodium levels have been as low as 125 He is now on a regimen of Florinef 3 days a week Again sodium is close to 130 He is tolerating the Florinef well without change in blood pressure, no edema or abnormal potassium  HYPOTHYROIDISM: Recent levels have been consistently normal on 137 mcg To check labs again on the next visit  History of hypertension: Blood pressure is excellent, lower on the second measurement including standing  PLAN:   No change in any of the medications as above  Since his A1c is increasing he needs to start checking some readings after meals although his level of control is still adequate for his age   Elayne Snare 03/29/2022, 1:51 PM      Note: This office note was prepared with Estate agent. Any transcriptional errors that result from this process are unintentional.

## 2022-03-29 NOTE — Patient Instructions (Signed)
THYROID PILLS 1 pill daily and 1/2 on Sundays

## 2022-03-30 ENCOUNTER — Ambulatory Visit (INDEPENDENT_AMBULATORY_CARE_PROVIDER_SITE_OTHER): Payer: Medicare Other | Admitting: Podiatry

## 2022-03-30 ENCOUNTER — Encounter: Payer: Self-pay | Admitting: Podiatry

## 2022-03-30 DIAGNOSIS — M79675 Pain in left toe(s): Secondary | ICD-10-CM

## 2022-03-30 DIAGNOSIS — B351 Tinea unguium: Secondary | ICD-10-CM | POA: Diagnosis not present

## 2022-03-30 DIAGNOSIS — M79674 Pain in right toe(s): Secondary | ICD-10-CM

## 2022-03-30 DIAGNOSIS — L853 Xerosis cutis: Secondary | ICD-10-CM | POA: Diagnosis not present

## 2022-03-30 DIAGNOSIS — E871 Hypo-osmolality and hyponatremia: Secondary | ICD-10-CM | POA: Diagnosis not present

## 2022-03-30 MED ORDER — AMMONIUM LACTATE 12 % EX LOTN: 1.0000 | TOPICAL_LOTION | CUTANEOUS | 0 refills | Status: DC | PRN

## 2022-03-30 NOTE — Progress Notes (Signed)
Patient ID: Kyle Simon, male   DOB: 1932-12-13, 89 y.o.   MRN: [id number]            Reason for Appointment: Endocrinology follow-up visit    History of Present Illness:   HYPONATREMIA:  Background history:  Serum sodium has been low since at least 2011 and has been as low as 125 in the past More recently lowest reading 127 He has not had any associated symptoms of decreased appetite, nausea or drowsiness Not on any thiazide diuretics or SSRI drugs Has had negative endocrine work-up for cortisol deficiencies  Has a history of drinking a lot of water partly for his constipation  Recent history: His urine osmolality was last high at 551 Urine osmolality also high at 442 on initial evaluation Urine sodium was 130 previously  He has been advised cut back on his fluid intake especially coffee He is usually drinking only 2 cup of coffee   With a trial of salt tablets his sodium did not improve, it was still down to 126 in 9/21 Also was having nausea from taking more than 1 regular tablet daily  Initially sodium was significantly better after starting DEMECLOCYCLINE 150 mg daily in 9/21 However this was stopped when his sodium decreased on the treatment down to 126  He was empirically started on FLORINEF in April 2023 because of persistently low sodium, mild increase in potassium and history of relatively high urine sodium Also had a potassium of 5.3 at baseline He is now taking this 3 times a week  Sodium is now 128, was 130 He still does not feel any change in appetite, nausea, weakness or somnolence  No swelling of the ankles with Florinef which he takes regularly   CT scan of chest did not show any tumor  Lab Results  Component Value Date   NA 128 (L) 03/25/2022   K 4.5 03/25/2022   CL 93 (L) 03/25/2022   CO2 26 03/25/2022     Hypothyroidism was first diagnosed  around 2005 years ago   At the time of diagnosis patient was not having symptoms of  fatigue,  cold sensitivity, difficulty concentrating, dry skin, weight gain Likely had only done routine testing and was told that he was hypothyroid and started him on unknown dose of levothyroxine  He did not start feeling any different when he started taking levothyroxine initially Also usually does not feel any different with dosage adjustments that have been made Apparently his dose was 150 g for some time but this was reduced in 06/2015 when his TSH was low normal Since then he has been taking 150 g 5 days a week and 125 g twice a week on-insulin initial consultation he was complaining that since spring time his energy level has been not as good and he has not been able to do as much with activities as he used to   He was referred here because his TSH is tending to be high but also his free T4 was relatively higher in 7/18  Recent history: On his initial consultation since his TSH was still relatively high and he was taking the equivalent of about 142 g of levothyroxine daily he was switched to taking 150 g once daily  He is taking his levothyroxine regularly before breakfast every morning The dose has been gradually decreased the last 2 times  He is now on 137 mcg levothyroxine  Has no unusual fatigue TSH appears to be low normal    Patient's  weight history is as follows:  Wt Readings from Last 3 Encounters:  03/29/22 184 lb 4 oz (83.6 kg)  01/07/22 183 lb 4 oz (83.1 kg)  01/06/22 183 lb (83 kg)    Thyroid function results have been as follows:  Lab Results  Component Value Date   TSH 0.45 03/25/2022   TSH 3.17 11/09/2021   TSH 0.61 08/20/2021   TSH 0.44 06/15/2021   FREET4 1.46 03/25/2022   FREET4 1.22 11/09/2021   FREET4 1.60 08/20/2021   FREET4 1.24 06/15/2021   T3FREE 3.2 12/28/2016   T3FREE 2.7 10/17/2015    Lab Results  Component Value Date   TSH 0.45 03/25/2022   TSH 3.17 11/09/2021   TSH 0.61 08/20/2021   TSH 0.44 06/15/2021   TSH 0.10 (L) 04/21/2021    TSH 0.05 (L) 02/16/2021   TSH 1.02 10/13/2020   TSH 0.55 06/30/2020   TSH 3.51 05/02/2020     Past Medical History:  Diagnosis Date   Arthritis    both hands;Dr.Deveshwar   Constipation due to pain medication    Diabetes mellitus without complication ([place])    Type II. Uses no medication. Pt controls with diet and weight   Enlarged prostate    Frequent urination at night    GERD (gastroesophageal reflux disease)    History of noncompliance with medical treatment, presenting hazards to health 11/20/2018   Hyperlipidemia associated with type 2 diabetes mellitus ([place]) 04/19/2007   Qualifier: Diagnosis of  By: Linna Darner MD, William     Hypertension    Hypothyroidism    Polio    as a child w/o complications   Snoring    no sleep apnea   Statin declined-  11/20/2018   patient understands risks associated with this decision and has been advised to restart by cardiologist as well as myself several times   Torn meniscus    Transfusion history 2012   post TKR    Past Surgical History:  Procedure Laterality Date   ANTERIOR LAT LUMBAR FUSION Right 11/03/2017   Procedure: Right Lumbar three-four Anterolateral lumbar interbody fusion with lateral plate;  Surgeon: Erline Levine, MD;  Location: [facility];  Service: Neurosurgery;  Laterality: Right;   BACK SURGERY  2011   hemiarthroplasty 01/11/1999 and 6   CARDIAC CATHETERIZATION  09/2001   negative   COLONOSCOPY     EYE SURGERY  2003   R&L cataract removed & IOL- 2003 & 2007,Dr.Jenkins   INGUINAL HERNIA REPAIR  2002   right, Dr. Truitt Leep   JOINT REPLACEMENT  2012   left knee replacement   LIPOMA EXCISION     back   MENISCECTOMY Bilateral    Dr. Noemi Chapel   PROSTATE SURGERY  07/2006   for urinary urgency, Dr. Everardo Pacific CUFF REPAIR Right October, 2016   sinus & throat surgery-1995  1995   TONSILLECTOMY     TOTAL KNEE ARTHROPLASTY  01/11/2011   Procedure: TOTAL KNEE ARTHROPLASTY;  Surgeon: Rudean Haskell, MD;  Location: [facility];   Service: Orthopedics;  Laterality: Left;   TOTAL KNEE ARTHROPLASTY Right 11/22/2016   Procedure: TOTAL KNEE ARTHROPLASTY;  Surgeon: Vickey Huger, MD;  Location: [facility];  Service: Orthopedics;  Laterality: Right;   UVULECTOMY  [id number]   Dr. Erik Obey   [place]   fracture- right   XI ROBOTIC ASSISTED VENTRAL HERNIA N/A 09/07/2020   Procedure: XI ROBOTIC ASSISTED DIAGNOSTIC LAPAROSCOPY;  Surgeon: Jules Husbands, MD;  Location: [facility];  Service: General;  Laterality: N/A;    Family History  Problem Relation Age of Onset   Coronary artery disease Mother    Emphysema Mother    Diverticulosis Mother    Thyroid disease Mother    Sudden death Father        accident   Asthma Brother    Bone cancer Brother    Thyroid disease Daughter    Sarcoidosis Son    Diabetes Paternal Grandmother    Anesthesia problems Neg Hx    Hypotension Neg Hx    Malignant hyperthermia Neg Hx    Pseudochol deficiency Neg Hx    Colon cancer Neg Hx     Social History:  reports that he quit smoking about 56 years ago. His smoking use included cigarettes. He has a 18.00 pack-year smoking history. He has never been exposed to tobacco smoke. He has never used smokeless tobacco. He reports current alcohol use of about 4.0 standard drinks of alcohol per week. He reports that he does not use drugs.  Allergies:  Allergies  Allergen Reactions   Simvastatin Other (See Comments)    MYALGIAS   Oxycodone Other (See Comments)    Severe constipation    Allergies as of 03/29/2022       Reactions   Simvastatin Other (See Comments)   MYALGIAS   Oxycodone Other (See Comments)   Severe constipation        Medication List        Accurate as of March 29, 2022 11:59 PM. If you have any questions, ask your nurse or doctor.          acetaminophen 500 MG tablet Commonly known as: TYLENOL Take 1,000 mg by mouth every 6 (six) hours as needed for mild pain. Takes 2 tablets nightly   ARTIFICIAL  TEARS OP Place 1 drop into both eyes 2 (two) times daily.   aspirin 81 MG tablet Take 81 mg by mouth every evening.   celecoxib 200 MG capsule Commonly known as: CELEBREX Take 200 mg by mouth 2 (two) times daily.   Clobetasol Propionate E 0.05 % emollient cream Generic drug: Clobetasol Prop Emollient Base as needed.   diclofenac Sodium 1 % Gel Commonly known as: VOLTAREN Apply 2 g topically at bedtime.   finasteride 5 MG tablet Commonly known as: PROSCAR Take 5 mg by mouth daily.   Fish Oil 1000 MG Caps Take 1,000 mg by mouth every evening.   fludrocortisone 0.1 MG tablet Commonly known as: FLORINEF 1 tablet 3 days a week   Iron 28 MG Tabs Take 1 each by mouth daily.   multivitamin with minerals Tabs tablet Take 1 tablet by mouth daily.   polyethylene glycol 17 g packet Commonly known as: MiraLax Take 17 g by mouth daily as needed for mild constipation or moderate constipation.   Restora Caps One tab qd   rosuvastatin 5 MG tablet Commonly known as: Crestor Take 1 tablet (5 mg total) by mouth at bedtime.   Synthroid 137 MCG tablet Generic drug: levothyroxine TAKE 1 TABLET DAILY BEFORE BREAKFAST   tamsulosin 0.4 MG Caps capsule Commonly known as: FLOMAX Take 0.4 mg by mouth 2 (two) times daily.         Review of Systems   Previously on losartan 50 mg for reportedly kidney protection by PCP Losartan was stopped because of low normal blood pressure readings previously  He has a blood pressure monitor at home  Recent home BP: Usually  163-845 systolic  Cortisol level has been normal  BP Readings from Last 3 Encounters:  03/29/22 (!) 140/60  01/07/22 (!) 168/70  01/06/22 (!) 167/81     He has history of mild diabetes with A1c as high as 7.1.  Also several years ago he did have blood sugars in the diabetic range  Glucose has been not as well controlled with A1c 6.9 again compared to 6.3  He is using One Touch Verio monitor  Checking blood  sugars in the mornings and these are close to 100 but does not remember the reading His blood sugar was 170 in the lab He is eating 2 donuts in the morning almost daily  Has not been treated with oral hypoglycemic drugs  Has limited ability to exercise because of knee pain No recent weight change  Wt Readings from Last 3 Encounters:  03/29/22 184 lb 4 oz (83.6 kg)  01/07/22 183 lb 4 oz (83.1 kg)  01/06/22 183 lb (83 kg)   Lab Results  Component Value Date   HGBA1C 6.9 (H) 03/25/2022   HGBA1C 6.9 (H) 11/09/2021   HGBA1C 6.3 (A) 06/17/2021   Lab Results  Component Value Date   MICROALBUR 10 04/14/2017   LDLCALC 58 11/26/2021   CREATININE 0.77 03/25/2022            Examination:    BP (!) 140/60   Pulse (!) 103   Ht '5\' 10"'$  (1.778 m)   Wt 184 lb 4 oz (83.6 kg)   BMI 26.44 kg/m   No ankle edema present  Assessment:    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 since he has had a long history of hyponatremia and CT scan did not show tumor He is usually asymptomatic  Sodium levels have been as low as 125 He is now on a regimen of Florinef 3 days a week Sodium is slightly lower at 128 Potassium normal  HYPOTHYROIDISM: Subjectively doing well TSH low normal with 137 mcg levothyroxine  History of hypertension: Blood pressure is relatively high but no orthostasis Slightly better at home reportedly  PLAN:   Fluid restriction Since blood pressure is high normal will not increase his Florinef Also since he is asymptomatic we will continue to monitor sodium Meanwhile will recheck his urine sodium and osmolality  He will take his Synthroid 6 days a week and 1 tablet on the seventh day take half tablet  Discussed having a balanced meal in the morning with protein and avoiding sweets like donuts Also checking more readings after meals   Patient Instructions  THYROID PILLS 1 pill daily and 1/2 on Sundays  Kyle Simon 03/30/2022, 12:35 PM      Note:  This office note was prepared with Dragon voice recognition system technology. Any transcriptional errors that result from this process are unintentional.

## 2022-03-30 NOTE — Progress Notes (Signed)
  Subjective:  Patient ID: Kyle Simon, male    DOB: [birth year],  MRN: [id number]  Chief Complaint  Patient presents with   Nail Problem    Nail trim    89 y.o. male returns for the above complaint.  Patient presents with thickened elongated dystrophic toenails x 10 mild pain on palpation hurts with ambulation hurts with pressure.  He would like to have them debride them.  He also has dry skin for which she would like to discuss treatment options.  He would like he is tried over-the-counter options which has not helped.  He would like to discuss options to treat dryness.  Objective:  There were no vitals filed for this visit. Podiatric Exam: Vascular: dorsalis pedis and posterior tibial pulses are palpable bilateral. Capillary return is immediate. Temperature gradient is WNL. Skin turgor WNL  Sensorium: Normal Semmes Weinstein monofilament test. Normal tactile sensation bilaterally. Nail Exam: Pt has thick disfigured discolored nails with subungual debris noted bilateral entire nail hallux through fifth toenails.  Pain on palpation to the nails. Ulcer Exam: There is no evidence of ulcer or pre-ulcerative changes or infection. Orthopedic Exam: Muscle tone and strength are WNL. No limitations in general ROM. No crepitus or effusions noted.  Skin: No Porokeratosis. No infection or ulcers.  Xerosis noted to bilateral feet mild to moderate no skin fissures noted.  No abnormality noted.    Assessment & Plan:   1. Pain due to onychomycosis of toenails of both feet   2. Xerosis of skin     Patient was evaluated and treated and all questions answered.  Xerosis skin -I explained to the patient the etiology of xerosis and various treatment options were extensively discussed.  I explained to the patient the importance of maintaining moisturization of the skin with application of over-the-counter lotion such as Eucerin or Luciderm.  Given the patient has failed all current medication patient  will benefit from ammonium lactate.  I have asked him to apply twice a day he states understanding   Onychomycosis with pain  -Nails palliatively debrided as below. -Educated on self-care  Procedure: Nail Debridement Rationale: pain  Type of Debridement: manual, sharp debridement. Instrumentation: Nail nipper, rotary burr. Number of Nails: 10  Procedures and Treatment: Consent by patient was obtained for treatment procedures. The patient understood the discussion of treatment and procedures well. All questions were answered thoroughly reviewed. Debridement of mycotic and hypertrophic toenails, 1 through 5 bilateral and clearing of subungual debris. No ulceration, no infection noted.  Return Visit-Office Procedure: Patient instructed to return to the office for a follow up visit 3 months for continued evaluation and treatment.  Boneta Lucks, DPM    Return in about 3 months (around 06/29/2022).

## 2022-04-02 ENCOUNTER — Encounter: Payer: Self-pay | Admitting: Family Medicine

## 2022-04-02 ENCOUNTER — Ambulatory Visit: Payer: Medicare Other | Admitting: Podiatry

## 2022-04-02 ENCOUNTER — Ambulatory Visit (INDEPENDENT_AMBULATORY_CARE_PROVIDER_SITE_OTHER): Payer: Medicare Other | Admitting: Family Medicine

## 2022-04-02 VITALS — BP 152/72 | HR 53 | Temp 97.8°F | Resp 16 | Ht 70.0 in | Wt 185.0 lb

## 2022-04-02 DIAGNOSIS — J3489 Other specified disorders of nose and nasal sinuses: Secondary | ICD-10-CM

## 2022-04-02 DIAGNOSIS — K089 Disorder of teeth and supporting structures, unspecified: Secondary | ICD-10-CM

## 2022-04-02 LAB — OSMOLALITY, URINE: Osmolality, Ur: 247 mOsmol/kg

## 2022-04-02 LAB — SODIUM, URINE, RANDOM: Sodium, Ur: 83 mmol/L

## 2022-04-02 NOTE — Assessment & Plan Note (Signed)
Chronic, no clear dental abscess.  Poor dentition.  Patient has follow-up with dentist for possible tooth extraction next week.

## 2022-04-02 NOTE — Progress Notes (Signed)
Patient ID: Kyle Simon, male    DOB: 1932-05-14, 89 y.o.   MRN: [id number]  This visit was conducted in person.  BP (!) 152/72   Pulse (!) 53   Temp 97.8 F (36.6 C)   Resp 16   Ht '5\' 10"'$  (1.778 m)   Wt 185 lb (83.9 kg)   SpO2 99%   BMI 26.54 kg/m    CC:  Chief Complaint  Patient presents with   Nasal Congestion    X 2 weeks dark in color and taste awful infection taste    Subjective:   HPI: Kyle Simon is a 89 y.o. male with diabetes history of eustachian tube dysfunction and chronic rhinitis presenting on 04/02/2022 for Nasal Congestion (X 2 weeks dark in color and taste awful infection taste)   Date of onset:  2 weeks Initial symptoms included  post nasal discharge..  nasal discharge grey almost black.  Occ sneeze. No face pain. No ear pain, no ST.  Symptoms progressed to fatigue  Was treated 2 weeks a go with azithromycin for tooth pain,  improved but then recurred. 1 week ago had a swelling  on gum near infeciton teeth... dentist said it was a boil.  Now teeth are numb.  No fever   No SOB, no wheeze.  No cough  No new body aches.    Sick contacts:  none COVID testing:   negative 2 weeks ago     He has tried to treat with  pseudoephedrine. Has been rinsing mouth with salt water.     No history of chronic lung disease such as asthma or COPD. Non-smoker.  Has follow up with dentist next week.. for possible removal of teeth.     Relevant past medical, surgical, family and social history reviewed and updated as indicated. Interim medical history since our last visit reviewed. Allergies and medications reviewed and updated. Outpatient Medications Prior to Visit  Medication Sig Dispense Refill   acetaminophen (TYLENOL) 500 MG tablet Take 1,000 mg by mouth every 6 (six) hours as needed for mild pain. Takes 2 tablets nightly     ammonium lactate (AMLACTIN DAILY) 12 % lotion Apply 1 Application topically as needed for dry skin. 400 g 0   aspirin 81 MG  tablet Take 81 mg by mouth every evening.      celecoxib (CELEBREX) 200 MG capsule Take 200 mg by mouth 2 (two) times daily.     CLOBETASOL PROPIONATE E 0.05 % emollient cream as needed.     diclofenac Sodium (VOLTAREN) 1 % GEL Apply 2 g topically at bedtime.     Ferrous Sulfate (IRON) 28 MG TABS Take 1 each by mouth daily.     finasteride (PROSCAR) 5 MG tablet Take 5 mg by mouth daily.     fludrocortisone (FLORINEF) 0.1 MG tablet 1 tablet 3 days a week 40 tablet 3   Hypromellose (ARTIFICIAL TEARS OP) Place 1 drop into both eyes 2 (two) times daily.     Multiple Vitamin (MULTIVITAMIN WITH MINERALS) TABS Take 1 tablet by mouth daily.     Omega-3 Fatty Acids (FISH OIL) 1000 MG CAPS Take 1,000 mg by mouth every evening.     polyethylene glycol (MIRALAX) 17 g packet Take 17 g by mouth daily as needed for mild constipation or moderate constipation. 14 each 0   Probiotic Product (RESTORA) CAPS One tab qd 90 capsule 3   rosuvastatin (CRESTOR) 5 MG tablet Take 1 tablet (5 mg total)  by mouth at bedtime. 90 tablet 3   SYNTHROID 137 MCG tablet TAKE 1 TABLET DAILY BEFORE BREAKFAST 90 tablet 3   tamsulosin (FLOMAX) 0.4 MG CAPS capsule Take 0.4 mg by mouth 2 (two) times daily.     No facility-administered medications prior to visit.     Per HPI unless specifically indicated in ROS section below Review of Systems  Constitutional:  Negative for chills and fever.  HENT:  Positive for congestion. Negative for ear pain and sore throat.   Eyes:  Negative for pain and redness.  Respiratory:  Negative for cough and shortness of breath.   Cardiovascular:  Negative for chest pain, palpitations and leg swelling.  Gastrointestinal:  Negative for abdominal pain, blood in stool, constipation, diarrhea, nausea and vomiting.  Genitourinary:  Negative for dysuria.  Musculoskeletal:  Negative for myalgias.  Skin:  Negative for rash.  Neurological:  Negative for dizziness.  Psychiatric/Behavioral:  The patient is not  nervous/anxious.    Objective:  BP (!) 152/72   Pulse (!) 53   Temp 97.8 F (36.6 C)   Resp 16   Ht '5\' 10"'$  (1.778 m)   Wt 185 lb (83.9 kg)   SpO2 99%   BMI 26.54 kg/m   Wt Readings from Last 3 Encounters:  04/02/22 185 lb (83.9 kg)  03/29/22 184 lb 4 oz (83.6 kg)  01/07/22 183 lb 4 oz (83.1 kg)      Physical Exam Constitutional:      Appearance: He is well-developed.  HENT:     Head: Normocephalic.     Right Ear: Hearing normal.     Left Ear: Hearing normal.     Nose: Nose normal.     Mouth/Throat:     Mouth: Mucous membranes are moist. No injury or oral lesions.     Dentition: Abnormal dentition. Has dentures. Gingival swelling present. No dental tenderness, dental caries, dental abscesses or gum lesions.     Tongue: No lesions.     Palate: No mass.     Pharynx: Oropharynx is clear.     Tonsils: No tonsillar exudate.  Neck:     Thyroid: No thyroid mass or thyromegaly.     Vascular: No carotid bruit.     Trachea: Trachea normal.  Cardiovascular:     Rate and Rhythm: Normal rate and regular rhythm.     Pulses: Normal pulses.     Heart sounds: Heart sounds not distant. No murmur heard.    No friction rub. No gallop.     Comments: No peripheral edema Pulmonary:     Effort: Pulmonary effort is normal. No respiratory distress.     Breath sounds: Normal breath sounds.  Skin:    General: Skin is warm and dry.     Findings: No rash.  Psychiatric:        Speech: Speech normal.        Behavior: Behavior normal.        Thought Content: Thought content normal.       Results for orders placed or performed in visit on 03/29/22  Sodium, urine, random  Result Value Ref Range   Sodium, Ur 83 Not Estab. mmol/L  Osmolality, urine  Result Value Ref Range   Osmolality, Ur 247 mOsmol/kg    Assessment and Plan  Nasal discharge Assessment & Plan: Acute, no clear sign of bacterial sinus infection.  Encouraged patient to use nasal saline to flush out the sinuses and to  start Flonase 2 sprays per nostril  at bedtime to decrease inflammation and swelling.   Poor dentition Assessment & Plan: Chronic, no clear dental abscess.  Poor dentition.  Patient has follow-up with dentist for possible tooth extraction next week.     No follow-ups on file.   Eliezer Lofts, MD

## 2022-04-02 NOTE — Patient Instructions (Signed)
Start Flonase 2 sprays per nostril at bedtime.  Use saline 2-3 times daily ( use prior to Flonase to not wash away medication) Continue oral saline and follow up with dentist as planned for tooth extraction.  Increase water intake.

## 2022-04-02 NOTE — Assessment & Plan Note (Signed)
Acute, no clear sign of bacterial sinus infection.  Encouraged patient to use nasal saline to flush out the sinuses and to start Flonase 2 sprays per nostril at bedtime to decrease inflammation and swelling.

## 2022-04-06 ENCOUNTER — Encounter: Payer: Self-pay | Admitting: Endocrinology

## 2022-04-07 ENCOUNTER — Encounter: Payer: Self-pay | Admitting: Gastroenterology

## 2022-04-07 ENCOUNTER — Ambulatory Visit (INDEPENDENT_AMBULATORY_CARE_PROVIDER_SITE_OTHER): Payer: Medicare Other | Admitting: Gastroenterology

## 2022-04-07 VITALS — BP 142/80 | HR 74 | Temp 97.7°F | Ht 70.0 in | Wt 184.1 lb

## 2022-04-07 DIAGNOSIS — K529 Noninfective gastroenteritis and colitis, unspecified: Secondary | ICD-10-CM

## 2022-04-07 NOTE — Progress Notes (Signed)
Cephas Darby, MD [address]  [place]  [place], [place] [address]  Main: [phone]  Fax: [phone]    Gastroenterology Consultation  Referring Provider:     Waunita Schooner, MD Primary Care Physician:  Eugenia Pancoast, FNP Primary Gastroenterologist:  Dr. Cephas Darby Reason for Consultation: Chronic diarrhea        HPI:   BARNIE SOPKO is a 89 y.o. male referred by Eugenia Pancoast, [place]  for consultation & management of chronic diarrhea.  Patient had history of small bowel obstruction secondary to adhesions status post lysis of adhesions in 08/2020.  Patient reports approximately 1 year history of loose stools, 3-4 times daily for 2 to 3 days followed by mucus drainage without any bowel movement for 2 days.  He then takes Dulcolax to have a bowel movement.  He denies any weight loss, loss of appetite, independent of ADLs.  He drinks plenty of water, tries to incorporate fiber in his diet.  He does not take MiraLAX, takes only Dulcolax as needed about 2-3 times weekly.  Has mild normocytic iron deficiency anemia.  Patient denies any abdominal pain or bloating or rectal bleeding.  Patient also takes probiotic daily.  NSAIDs: None  Antiplts/Anticoagulants/Anti thrombotics: None  GI Procedures: Colonoscopy 05/17/2012 Left colonic diverticulosis, otherwise normal colon  Past Medical History:  Diagnosis Date   Arthritis    both hands;Dr.Deveshwar   Constipation due to pain medication    Diabetes mellitus without complication ([place])    Type II. Uses no medication. Pt controls with diet and weight   Enlarged prostate    Frequent urination at night    GERD (gastroesophageal reflux disease)    History of noncompliance with medical treatment, presenting hazards to health 11/20/2018   Hyperlipidemia associated with type 2 diabetes mellitus ([place]) 04/19/2007   Qualifier: Diagnosis of  By: Linna Darner MD, William     Hypertension    Hypothyroidism    Polio    as a child w/o  complications   Snoring    no sleep apnea   Statin declined-  11/20/2018   patient understands risks associated with this decision and has been advised to restart by cardiologist as well as myself several times   Torn meniscus    Transfusion history 2012   post TKR    Past Surgical History:  Procedure Laterality Date   ANTERIOR LAT LUMBAR FUSION Right 11/03/2017   Procedure: Right Lumbar three-four Anterolateral lumbar interbody fusion with lateral plate;  Surgeon: Erline Levine, MD;  Location: [facility];  Service: Neurosurgery;  Laterality: Right;   BACK SURGERY  2011   hemiarthroplasty 01/11/1999 and 6   CARDIAC CATHETERIZATION  09/2001   negative   COLONOSCOPY     EYE SURGERY  2003   R&L cataract removed & IOL- 2003 & 2007,Dr.Jenkins   INGUINAL HERNIA REPAIR  2002   right, Dr. Truitt Leep   JOINT REPLACEMENT  2012   left knee replacement   LIPOMA EXCISION     back   MENISCECTOMY Bilateral    Dr. Noemi Chapel   PROSTATE SURGERY  07/2006   for urinary urgency, Dr. Everardo Pacific CUFF REPAIR Right October, 2016   sinus & throat surgery-1995  1995   TONSILLECTOMY     TOTAL KNEE ARTHROPLASTY  01/11/2011   Procedure: TOTAL KNEE ARTHROPLASTY;  Surgeon: Rudean Haskell, MD;  Location: [facility];  Service: Orthopedics;  Laterality: Left;   TOTAL KNEE ARTHROPLASTY Right 11/22/2016   Procedure:  TOTAL KNEE ARTHROPLASTY;  Surgeon: Vickey Huger, MD;  Location: [facility];  Service: Orthopedics;  Laterality: Right;   UVULECTOMY  8115   Dr. Erik Obey   [place]   fracture- right   XI ROBOTIC ASSISTED VENTRAL HERNIA N/A 09/07/2020   Procedure: XI ROBOTIC ASSISTED DIAGNOSTIC LAPAROSCOPY;  Surgeon: Jules Husbands, MD;  Location: [facility];  Service: General;  Laterality: N/A;     Current Outpatient Medications:    acetaminophen (TYLENOL) 500 MG tablet, Take 1,000 mg by mouth every 6 (six) hours as needed for mild pain. Takes 2 tablets nightly, Disp: , Rfl:    ammonium lactate  (AMLACTIN DAILY) 12 % lotion, Apply 1 Application topically as needed for dry skin., Disp: 400 g, Rfl: 0   aspirin 81 MG tablet, Take 81 mg by mouth every evening. , Disp: , Rfl:    celecoxib (CELEBREX) 200 MG capsule, Take 200 mg by mouth 2 (two) times daily., Disp: , Rfl:    CLOBETASOL PROPIONATE E 0.05 % emollient cream, as needed., Disp: , Rfl:    diclofenac Sodium (VOLTAREN) 1 % GEL, Apply 2 g topically at bedtime., Disp: , Rfl:    doxycycline (VIBRAMYCIN) 100 MG capsule, Take 100 mg by mouth., Disp: , Rfl:    Ferrous Sulfate (IRON) 28 MG TABS, Take 1 each by mouth daily., Disp: , Rfl:    finasteride (PROSCAR) 5 MG tablet, Take 5 mg by mouth daily., Disp: , Rfl:    fludrocortisone (FLORINEF) 0.1 MG tablet, 1 tablet 3 days a week, Disp: 40 tablet, Rfl: 3   Hypromellose (ARTIFICIAL TEARS OP), Place 1 drop into both eyes 2 (two) times daily., Disp: , Rfl:    Multiple Vitamin (MULTIVITAMIN WITH MINERALS) TABS, Take 1 tablet by mouth daily., Disp: , Rfl:    Omega-3 Fatty Acids (FISH OIL) 1000 MG CAPS, Take 1,000 mg by mouth every evening., Disp: , Rfl:    polyethylene glycol (MIRALAX) 17 g packet, Take 17 g by mouth daily as needed for mild constipation or moderate constipation., Disp: 14 each, Rfl: 0   Probiotic Product (RESTORA) CAPS, One tab qd, Disp: 90 capsule, Rfl: 3   rosuvastatin (CRESTOR) 5 MG tablet, Take 1 tablet (5 mg total) by mouth at bedtime., Disp: 90 tablet, Rfl: 3   SYNTHROID 137 MCG tablet, TAKE 1 TABLET DAILY BEFORE BREAKFAST, Disp: 90 tablet, Rfl: 3   tamsulosin (FLOMAX) 0.4 MG CAPS capsule, Take 0.4 mg by mouth 2 (two) times daily., Disp: , Rfl:    Family History  Problem Relation Age of Onset   Coronary artery disease Mother    Emphysema Mother    Diverticulosis Mother    Thyroid disease Mother    Sudden death Father        accident   Asthma Brother    Bone cancer Brother    Thyroid disease Daughter    Sarcoidosis Son    Diabetes Paternal Grandmother     Anesthesia problems Neg Hx    Hypotension Neg Hx    Malignant hyperthermia Neg Hx    Pseudochol deficiency Neg Hx    Colon cancer Neg Hx      Social History   Tobacco Use   Smoking status: Former    Packs/day: 1.00    Years: 18.00    Total pack years: 18.00    Types: Cigarettes    Quit date: 03/01/1966    Years since quitting: 56.1    Passive exposure:  Never   Smokeless tobacco: Never  Vaping Use   Vaping Use: Never used  Substance Use Topics   Alcohol use: Yes    Alcohol/week: 4.0 standard drinks of alcohol    Types: 4 Shots of liquor per week    Comment: social, few drinks a week   Drug use: No    Allergies as of 04/07/2022 - Review Complete 04/02/2022  Allergen Reaction Noted   Simvastatin Other (See Comments) 04/18/2007   Oxycodone Other (See Comments) 10/11/2013    Review of Systems:    All systems reviewed and negative except where noted in HPI.   Physical Exam:  BP (!) 142/80 (BP Location: Right Arm, Patient Position: Sitting, Cuff Size: Normal)   Pulse 74   Temp 97.7 F (36.5 C) (Oral)   Ht '5\' 10"'$  (1.778 m)   Wt 184 lb 2 oz (83.5 kg)   BMI 26.42 kg/m  No LMP for male patient.  General:   Alert,  Well-developed, well-nourished, pleasant and cooperative in NAD Head:  Normocephalic and atraumatic. Eyes:  Sclera clear, no icterus.   Conjunctiva pink. Ears:  Normal auditory acuity. Nose:  No deformity, discharge, or lesions. Mouth:  No deformity or lesions,oropharynx pink & moist. Neck:  Supple; no masses or thyromegaly. Lungs:  Respirations even and unlabored.  Clear throughout to auscultation.   No wheezes, crackles, or rhonchi. No acute distress. Heart:  Regular rate and rhythm; no murmurs, clicks, rubs, or gallops. Abdomen:  Normal bowel sounds. Soft, non-tender and non-distended without masses, hepatosplenomegaly or hernias noted.  No guarding or rebound tenderness.   Rectal: Not performed Msk:  Symmetrical without gross deformities. Good, equal  movement & strength bilaterally. Pulses:  Normal pulses noted. Extremities:  No clubbing or edema.  No cyanosis. Neurologic:  Alert and oriented x3;  grossly normal neurologically. Skin:  Intact without significant lesions or rashes. No jaundice. Psych:  Alert and cooperative. Normal mood and affect.  Imaging Studies: Reviewed  Assessment and Plan:   SHAFIQ LARCH is a 89 y.o. pleasant Caucasian male with history of high-grade small bowel obstruction s/p lysis of adhesions in 08/2020 is seen in consultation for 1 year history of nonbloody diarrhea  Recommend GI profile PCR, pancreatic fecal elastase levels, fecal calprotectin levels Check H. pylori stool antigen test Check celiac disease panel Discussed about flexible sigmoidoscopy if above workup is unremarkable Advised patient to avoid usage of laxatives.  Take MiraLAX as needed instead  Follow up after the above workup   Cephas Darby, MD

## 2022-04-08 ENCOUNTER — Telehealth: Payer: Self-pay

## 2022-04-08 DIAGNOSIS — K529 Noninfective gastroenteritis and colitis, unspecified: Secondary | ICD-10-CM

## 2022-04-08 NOTE — Telephone Encounter (Signed)
-----   Message from Lin Landsman, MD sent at 04/07/2022  5:42 PM EST ----- Regarding: fecal elastase [place] Coil like you forgot to order pancreatic fecal elastase levels  RV

## 2022-04-08 NOTE — Telephone Encounter (Signed)
Patient states he has already collected the stool he needed for the other test is planning on dropping it off today. He will pick up the last stool kit today when he drops off the other

## 2022-04-09 ENCOUNTER — Encounter: Payer: Self-pay | Admitting: Family

## 2022-04-09 ENCOUNTER — Ambulatory Visit (INDEPENDENT_AMBULATORY_CARE_PROVIDER_SITE_OTHER): Payer: Medicare Other | Admitting: Family

## 2022-04-09 VITALS — BP 188/98 | HR 54 | Temp 97.3°F | Ht 70.0 in | Wt 187.0 lb

## 2022-04-09 DIAGNOSIS — H9313 Tinnitus, bilateral: Secondary | ICD-10-CM | POA: Diagnosis not present

## 2022-04-09 DIAGNOSIS — H6991 Unspecified Eustachian tube disorder, right ear: Secondary | ICD-10-CM

## 2022-04-09 DIAGNOSIS — I152 Hypertension secondary to endocrine disorders: Secondary | ICD-10-CM

## 2022-04-09 DIAGNOSIS — E1159 Type 2 diabetes mellitus with other circulatory complications: Secondary | ICD-10-CM | POA: Diagnosis not present

## 2022-04-09 DIAGNOSIS — R21 Rash and other nonspecific skin eruption: Secondary | ICD-10-CM

## 2022-04-09 LAB — CELIAC DISEASE PANEL
Endomysial IgA: NEGATIVE
IgA/Immunoglobulin A, Serum: 358 mg/dL (ref 61–437)
Transglutaminase IgA: <2 U/mL (ref 0–3)

## 2022-04-09 MED ORDER — LOSARTAN POTASSIUM 50 MG PO TABS: 50.0000 mg | ORAL_TABLET | Freq: Every day | ORAL | 1 refills | Status: DC

## 2022-04-09 MED ORDER — CLOTRIMAZOLE-BETAMETHASONE 1-0.05 % EX CREA: 1.0000 | TOPICAL_CREAM | Freq: Every day | CUTANEOUS | 0 refills | Status: DC

## 2022-04-09 NOTE — Patient Instructions (Addendum)
Trial lotrisone cream to your rashes on your thighs to see if improvement.  I would suggest daily lotion (fragrance free to legs and body, suspected eczema as well from dry skin)  Aquaphor at night for the spots.   Start losartan 50 mg once daily for your blood pressure.   Call your Ent to see if you have been seen in the last three years, if you have then you are still an established patient and can make a follow up appointment with him in regards to your worsening ringing in your ears. If you are a new patient let me know and I can send a new referral.   Going to try to start a new medication called prolia for osteoporosis. If you do not hear anything in the next few weeks let me know.   Regards,   Eugenia Pancoast FNP-C

## 2022-04-09 NOTE — Assessment & Plan Note (Signed)
Ddx eczema vs fungal infection.  Trial lotrisone cream  Aquaphor at night.

## 2022-04-09 NOTE — Progress Notes (Unsigned)
Established Patient Office Visit  Subjective:   Patient ID: Kyle Simon, male    DOB: 03/04/1932  Age: 89 y.o. MRN: [id number]  CC:  Chief Complaint  Patient presents with   Tinnitus    B/L increased    Hypertension    Bp readings have been getting higher.     HPI: Kyle Simon is a 89 y.o. male presenting on 04/09/2022 for Tinnitus (B/L increased ) and Hypertension (Bp readings have been getting higher. )   Hypertension    HTN: lately higher blood pressure than average. Was on losartan in the past but was d/c he states because blood pressure was controlled. Denies cp palp and or sob.  Bumps on bil legs that itch often. Apt with dermatologist but not until march 21st. Rash started about six months ago. He states the rash is intermittent.   Bil tinnitus, worsening as of late. Sees an ENT currently, but has not been in there in the last few years.   Bone density , osteoporosis was seen by rheumatology who suggested possible treatment with prolia. He has since started taking calcium with vitamin D.   HTN: today elevated has been elevated at home even.  Denies cp palp and or sob. Average at home is around 160/150 over 80     ROS: Negative unless specifically indicated above in HPI.   Relevant past medical history reviewed and updated as indicated.   Allergies and medications reviewed and updated.   Current Outpatient Medications:    acetaminophen (TYLENOL) 500 MG tablet, Take 1,000 mg by mouth every 6 (six) hours as needed for mild pain. Takes 2 tablets nightly, Disp: , Rfl:    ammonium lactate (AMLACTIN DAILY) 12 % lotion, Apply 1 Application topically as needed for dry skin., Disp: 400 g, Rfl: 0   aspirin 81 MG tablet, Take 81 mg by mouth every evening. , Disp: , Rfl:    Calcium Carb-Cholecalciferol (CALCIUM 500/VITAMIN D PO), Take 1 tablet by mouth daily., Disp: , Rfl:    celecoxib (CELEBREX) 200 MG capsule, Take 200 mg by mouth 2 (two) times daily., Disp: , Rfl:     CLOBETASOL PROPIONATE E 0.05 % emollient cream, as needed., Disp: , Rfl:    clotrimazole-betamethasone (LOTRISONE) cream, Apply 1 Application topically daily., Disp: 30 g, Rfl: 0   diclofenac Sodium (VOLTAREN) 1 % GEL, Apply 2 g topically at bedtime., Disp: , Rfl:    doxycycline (VIBRAMYCIN) 100 MG capsule, Take 100 mg by mouth., Disp: , Rfl:    Ferrous Sulfate (IRON) 28 MG TABS, Take 1 each by mouth daily., Disp: , Rfl:    finasteride (PROSCAR) 5 MG tablet, Take 5 mg by mouth daily., Disp: , Rfl:    fludrocortisone (FLORINEF) 0.1 MG tablet, 1 tablet 3 days a week, Disp: 40 tablet, Rfl: 3   Hypromellose (ARTIFICIAL TEARS OP), Place 1 drop into both eyes 2 (two) times daily., Disp: , Rfl:    losartan (COZAAR) 50 MG tablet, Take 1 tablet (50 mg total) by mouth daily., Disp: 90 tablet, Rfl: 1   Multiple Vitamin (MULTIVITAMIN WITH MINERALS) TABS, Take 1 tablet by mouth daily., Disp: , Rfl:    Omega-3 Fatty Acids (FISH OIL) 1000 MG CAPS, Take 1,000 mg by mouth every evening., Disp: , Rfl:    polyethylene glycol (MIRALAX) 17 g packet, Take 17 g by mouth daily as needed for mild constipation or moderate constipation., Disp: 14 each, Rfl: 0   Probiotic Product (RESTORA) CAPS, One  tab qd, Disp: 90 capsule, Rfl: 3   rosuvastatin (CRESTOR) 5 MG tablet, Take 1 tablet (5 mg total) by mouth at bedtime., Disp: 90 tablet, Rfl: 3   SYNTHROID 137 MCG tablet, TAKE 1 TABLET DAILY BEFORE BREAKFAST, Disp: 90 tablet, Rfl: 3   tamsulosin (FLOMAX) 0.4 MG CAPS capsule, Take 0.4 mg by mouth 2 (two) times daily., Disp: , Rfl:   Allergies  Allergen Reactions   Simvastatin Other (See Comments)    MYALGIAS   Oxycodone Other (See Comments)    Severe constipation    Objective:   BP (!) 188/98   Pulse (!) 54   Temp (!) 97.3 F (36.3 C) (Temporal)   Ht 5' 10"$  (1.778 m)   Wt 187 lb (84.8 kg)   SpO2 94%   BMI 26.83 kg/m    Physical Exam Vitals reviewed.  Constitutional:      General: He is not in acute  distress.    Appearance: Normal appearance. He is obese. He is not ill-appearing, toxic-appearing or diaphoretic.  HENT:     Head: Normocephalic.     Right Ear: Tympanic membrane normal.     Left Ear: Tympanic membrane normal.     Nose: Nose normal.     Mouth/Throat:     Mouth: Mucous membranes are moist.  Eyes:     Pupils: Pupils are equal, round, and reactive to light.  Cardiovascular:     Rate and Rhythm: Normal rate and regular rhythm.  Pulmonary:     Effort: Pulmonary effort is normal.     Breath sounds: Normal breath sounds. No wheezing.  Musculoskeletal:        General: Normal range of motion.     Cervical back: Normal range of motion.  Skin:    Comments: Scaly lesion scattered on bil knees and bil thighs, very slight. Mild erythema at small areas.   Neurological:     General: No focal deficit present.     Mental Status: He is alert and oriented to person, place, and time. Mental status is at baseline.  Psychiatric:        Mood and Affect: Mood normal.        Behavior: Behavior normal.        Thought Content: Thought content normal.        Judgment: Judgment normal.     Assessment & Plan:  ETD (Eustachian tube dysfunction), right  Tinnitus of both ears  Skin eruption Assessment & Plan: Ddx eczema vs fungal infection.  Trial lotrisone cream  Aquaphor at night.   Orders: -     Clotrimazole-Betamethasone; Apply 1 Application topically daily.  Dispense: 30 g; Refill: 0  Hypertension associated with diabetes ([facility]) Assessment & Plan: Short term f/u in two weeks for blood pressure management Start losartan 50 mg once daily   ------------------------------------  Start monitoring your blood pressure daily, around the same time of day, for the next 2-3 weeks.  Ensure that you have rested for 30 minutes prior to checking your blood pressure. Record your readings and bring them to your next visit.  ------------------------------------   Orders: -     Losartan  Potassium; Take 1 tablet (50 mg total) by mouth daily.  Dispense: 90 tablet; Refill: 1     Follow up plan: Return in about 2 weeks (around 04/23/2022) for f/u blood pressure.  Eugenia Pancoast, FNP

## 2022-04-12 NOTE — Assessment & Plan Note (Signed)
Short term f/u in two weeks for blood pressure management Start losartan 50 mg once daily   ------------------------------------  Start monitoring your blood pressure daily, around the same time of day, for the next 2-3 weeks.  Ensure that you have rested for 30 minutes prior to checking your blood pressure. Record your readings and bring them to your next visit.  ------------------------------------

## 2022-04-13 ENCOUNTER — Telehealth: Payer: Self-pay

## 2022-04-13 ENCOUNTER — Other Ambulatory Visit: Payer: Self-pay

## 2022-04-13 DIAGNOSIS — K529 Noninfective gastroenteritis and colitis, unspecified: Secondary | ICD-10-CM | POA: Diagnosis not present

## 2022-04-13 DIAGNOSIS — R195 Other fecal abnormalities: Secondary | ICD-10-CM

## 2022-04-13 LAB — GI PROFILE, STOOL, PCR

## 2022-04-13 LAB — H. PYLORI ANTIGEN, STOOL: H pylori Ag, Stl: NEGATIVE

## 2022-04-13 LAB — CALPROTECTIN, FECAL: Calprotectin, Fecal: 991 ug/g — ABNORMAL HIGH (ref 0–120)

## 2022-04-13 MED ORDER — CLENPIQ 10-3.5-12 MG-GM -GM/175ML PO SOLN: 175.0000 mL | Freq: Once | ORAL | 0 refills | Status: AC

## 2022-04-13 NOTE — Telephone Encounter (Signed)
Called and patient is okay with scheduling colonoscopy. Scheduled patient for 05/05/2022. Went over instructions, mailed them and sent to [facility]. Sent prep to the pharmacy

## 2022-04-13 NOTE — Telephone Encounter (Signed)
-----   Message from Lin Landsman, MD sent at 04/13/2022 10:27 AM EST ----- Please inform patient that one of the stool studies came back elevated showing that he has inflammation in the colon which could explain his chronic diarrhea.  Recommend colonoscopy instead of a flexible sigmoidoscopy if patient is agreeable  Rohini Vanga

## 2022-04-19 LAB — PANCREATIC ELASTASE, FECAL: Pancreatic Elastase, Fecal: 57 ug Elast./g — ABNORMAL LOW (ref >200)

## 2022-04-21 DIAGNOSIS — M25562 Pain in left knee: Secondary | ICD-10-CM | POA: Diagnosis not present

## 2022-04-21 DIAGNOSIS — M25561 Pain in right knee: Secondary | ICD-10-CM | POA: Diagnosis not present

## 2022-04-21 DIAGNOSIS — R2681 Unsteadiness on feet: Secondary | ICD-10-CM | POA: Diagnosis not present

## 2022-04-23 DIAGNOSIS — M25561 Pain in right knee: Secondary | ICD-10-CM | POA: Diagnosis not present

## 2022-04-23 DIAGNOSIS — R2681 Unsteadiness on feet: Secondary | ICD-10-CM | POA: Diagnosis not present

## 2022-04-23 DIAGNOSIS — M25562 Pain in left knee: Secondary | ICD-10-CM | POA: Diagnosis not present

## 2022-04-26 DIAGNOSIS — M25561 Pain in right knee: Secondary | ICD-10-CM | POA: Diagnosis not present

## 2022-04-26 DIAGNOSIS — R2681 Unsteadiness on feet: Secondary | ICD-10-CM | POA: Diagnosis not present

## 2022-04-26 DIAGNOSIS — M25562 Pain in left knee: Secondary | ICD-10-CM | POA: Diagnosis not present

## 2022-04-27 ENCOUNTER — Encounter: Payer: Self-pay | Admitting: Family

## 2022-04-27 ENCOUNTER — Ambulatory Visit (INDEPENDENT_AMBULATORY_CARE_PROVIDER_SITE_OTHER): Payer: Medicare Other | Admitting: Family

## 2022-04-27 VITALS — BP 142/72 | HR 60 | Temp 97.6°F | Ht 70.0 in | Wt 184.2 lb

## 2022-04-27 DIAGNOSIS — E039 Hypothyroidism, unspecified: Secondary | ICD-10-CM

## 2022-04-27 DIAGNOSIS — I152 Hypertension secondary to endocrine disorders: Secondary | ICD-10-CM

## 2022-04-27 DIAGNOSIS — K529 Noninfective gastroenteritis and colitis, unspecified: Secondary | ICD-10-CM | POA: Insufficient documentation

## 2022-04-27 DIAGNOSIS — E1159 Type 2 diabetes mellitus with other circulatory complications: Secondary | ICD-10-CM

## 2022-04-27 DIAGNOSIS — R5383 Other fatigue: Secondary | ICD-10-CM | POA: Diagnosis not present

## 2022-04-27 LAB — CBC WITH DIFFERENTIAL/PLATELET
Basophils Absolute: 0 10*3/uL (ref 0.0–0.1)
Basophils Relative: 0.7 % (ref 0.0–3.0)
Eosinophils Absolute: 0.1 10*3/uL (ref 0.0–0.7)
Eosinophils Relative: 2 % (ref 0.0–5.0)
HCT: 34.1 % — ABNORMAL LOW (ref 39.0–52.0)
Hemoglobin: 11.7 g/dL — ABNORMAL LOW (ref 13.0–17.0)
Lymphocytes Relative: 24.6 % (ref 12.0–46.0)
Lymphs Abs: 1.3 10*3/uL (ref 0.7–4.0)
MCHC: 34.3 g/dL (ref 30.0–36.0)
MCV: 92.2 fl (ref 78.0–100.0)
Monocytes Absolute: 0.5 10*3/uL (ref 0.1–1.0)
Monocytes Relative: 10 % (ref 3.0–12.0)
Neutro Abs: 3.4 10*3/uL (ref 1.4–7.7)
Neutrophils Relative %: 62.7 % (ref 43.0–77.0)
Platelets: 252 10*3/uL (ref 150.0–400.0)
RBC: 3.7 Mil/uL — ABNORMAL LOW (ref 4.22–5.81)
RDW: 13.1 % (ref 11.5–15.5)
WBC: 5.4 10*3/uL (ref 4.0–10.5)

## 2022-04-27 LAB — MICROALBUMIN / CREATININE URINE RATIO
Creatinine,U: 56.6 mg/dL
Microalb Creat Ratio: 1.2 mg/g (ref 0.0–30.0)
Microalb, Ur: <0.7 mg/dL (ref 0.0–1.9)

## 2022-04-27 NOTE — Assessment & Plan Note (Addendum)
Patient to keep scheduled appointment for colonoscopy on March 6th. CBC ordered and pending.

## 2022-04-27 NOTE — Assessment & Plan Note (Signed)
Continue to take Losartan '50mg'$  daily.  Advised patient to keep record of blood pressures at home.  Microalbumin ordered and pending.   I evaluated the patient,  was consulted regarding plans for treatment of care, and agree with the assessment and plan per Joya Gaskins, RN, DNP student.  -Eugenia Pancoast, FNP-C

## 2022-04-27 NOTE — Assessment & Plan Note (Signed)
Continue taking Synthroid 143mg daily.

## 2022-04-27 NOTE — Progress Notes (Unsigned)
Established Patient Office Visit  Subjective:      CC:  Chief Complaint  Patient presents with   Medical Management of Chronic Issues    HPI: Kyle Simon is a 89 y.o. male presenting on 04/27/2022 for Medical Management of Chronic Issues . HTN: has not been checking blood pressure at home, initially 142/72 in office then repeated 10 minutes later and blood pressure 129/72. Did restart losartan 50 mg last visit, and tolerating well. Denies cp palp and or sob.   Vitamin B12 excess: is still taking daily supplement.  Lab Results  Component Value Date   VITAMINB12 >1500 (H) 11/26/2021    New complaints: About two weeks ago pt states he was unable to recollect what he was saying, lasted for about three minutes, he knew what he wanted to say but states he could not get it out. Wife is accompanying with him, and she states she thinks it also occurred the day before. She states that she was walking out of the room and it sounded like he was mumbling. Has not had any episodes since. Is taking a baby asa daily. Did not check blood pressure when this occurred. He did not have food for the last 7 hours prior to onset of symptoms, they did not check his glucose at that time.   Glucose fasting yesterday am was 100.  Lab Results  Component Value Date   HGBA1C 6.9 (H) 03/25/2022        Social history:  Relevant past medical, surgical, family and social history reviewed and updated as indicated. Interim medical history since our last visit reviewed.  Allergies and medications reviewed and updated.  DATA REVIEWED: CHART IN EPIC     ROS: Negative unless specifically indicated above in HPI.    Current Outpatient Medications:    acetaminophen (TYLENOL) 500 MG tablet, Take 1,000 mg by mouth every 6 (six) hours as needed for mild pain. Takes 2 tablets nightly, Disp: , Rfl:    ammonium lactate (AMLACTIN DAILY) 12 % lotion, Apply 1 Application topically as needed for dry skin., Disp:  400 g, Rfl: 0   aspirin 81 MG tablet, Take 81 mg by mouth every evening. , Disp: , Rfl:    Calcium Carb-Cholecalciferol (CALCIUM 500/VITAMIN D PO), Take 1 tablet by mouth daily., Disp: , Rfl:    celecoxib (CELEBREX) 200 MG capsule, Take 200 mg by mouth 2 (two) times daily., Disp: , Rfl:    CLOBETASOL PROPIONATE E 0.05 % emollient cream, as needed., Disp: , Rfl:    clotrimazole-betamethasone (LOTRISONE) cream, Apply 1 Application topically daily., Disp: 30 g, Rfl: 0   diclofenac Sodium (VOLTAREN) 1 % GEL, Apply 2 g topically at bedtime., Disp: , Rfl:    doxycycline (VIBRAMYCIN) 100 MG capsule, Take 100 mg by mouth., Disp: , Rfl:    Ferrous Sulfate (IRON) 28 MG TABS, Take 1 each by mouth daily., Disp: , Rfl:    finasteride (PROSCAR) 5 MG tablet, Take 5 mg by mouth daily., Disp: , Rfl:    fludrocortisone (FLORINEF) 0.1 MG tablet, 1 tablet 3 days a week, Disp: 40 tablet, Rfl: 3   Hypromellose (ARTIFICIAL TEARS OP), Place 1 drop into both eyes 2 (two) times daily., Disp: , Rfl:    losartan (COZAAR) 50 MG tablet, Take 1 tablet (50 mg total) by mouth daily., Disp: 90 tablet, Rfl: 1   Multiple Vitamin (MULTIVITAMIN WITH MINERALS) TABS, Take 1 tablet by mouth daily., Disp: , Rfl:    Omega-3 Fatty  Acids (FISH OIL) 1000 MG CAPS, Take 1,000 mg by mouth every evening., Disp: , Rfl:    polyethylene glycol (MIRALAX) 17 g packet, Take 17 g by mouth daily as needed for mild constipation or moderate constipation., Disp: 14 each, Rfl: 0   Probiotic Product (RESTORA) CAPS, One tab qd, Disp: 90 capsule, Rfl: 3   rosuvastatin (CRESTOR) 5 MG tablet, Take 1 tablet (5 mg total) by mouth at bedtime., Disp: 90 tablet, Rfl: 3   SYNTHROID 137 MCG tablet, TAKE 1 TABLET DAILY BEFORE BREAKFAST, Disp: 90 tablet, Rfl: 3   tamsulosin (FLOMAX) 0.4 MG CAPS capsule, Take 0.4 mg by mouth 2 (two) times daily., Disp: , Rfl:       Objective:    BP (!) 142/72   Pulse 60   Temp 97.6 F (36.4 C) (Temporal)   Ht '5\' 10"'$  (1.778 m)    Wt 184 lb 3.2 oz (83.6 kg)   SpO2 99%   BMI 26.43 kg/m   Wt Readings from Last 3 Encounters:  04/27/22 184 lb 3.2 oz (83.6 kg)  04/09/22 187 lb (84.8 kg)  04/07/22 184 lb 2 oz (83.5 kg)    Physical Exam Constitutional:      General: He is not in acute distress.    Appearance: Normal appearance. He is normal weight. He is not ill-appearing, toxic-appearing or diaphoretic.  Cardiovascular:     Rate and Rhythm: Normal rate.  Pulmonary:     Effort: Pulmonary effort is normal.  Musculoskeletal:        General: Normal range of motion.  Neurological:     General: No focal deficit present.     Mental Status: He is alert and oriented to person, place, and time. Mental status is at baseline.     Cranial Nerves: Cranial nerves 2-12 are intact. No cranial nerve deficit or facial asymmetry.     Sensory: Sensation is intact.     Motor: Motor function is intact.     Coordination: Coordination is intact.     Gait: Gait abnormal (uses cane).  Psychiatric:        Mood and Affect: Mood normal.        Behavior: Behavior normal.        Thought Content: Thought content normal.        Judgment: Judgment normal.            Assessment & Plan:  Chronic diarrhea Assessment & Plan: Patient to keep scheduled appointment for colonoscopy on March 6th. CBC ordered and pending.   Orders: -     CBC with Differential/Platelet  Lethargy Assessment & Plan: CBC ordered and pending. Patient advised if episode of speech difficulty occurs again to take blood pressure, check blood glucose, and monitor for stroke symptoms.  Patient and spouse educated on stroke symptoms and to call 911 if these symptoms do occur  Orders: -     CBC with Differential/Platelet  Hypothyroidism, unspecified type Assessment & Plan: Continue taking Synthroid 159mg daily.    Hypertension associated with diabetes ([facility] Assessment & Plan: Continue to take Losartan '50mg'$  daily.  Advised patient to keep record of blood  pressures at home.  Microalbumin ordered and pending.   I evaluated the patient,  was consulted regarding plans for treatment of care, and agree with the assessment and plan per TJoya Gaskins RN, DNP student.  -Eugenia Pancoast FNP-C   Orders: -     Microalbumin / creatinine urine ratio     Return in about 6  months (around 10/26/2022) for f/u blood pressure.  Eugenia Pancoast, MSN, APRN, FNP-C [place]

## 2022-04-27 NOTE — Assessment & Plan Note (Addendum)
CBC ordered and pending. Patient advised if episode of speech difficulty occurs again to take blood pressure, check blood glucose, and monitor for stroke symptoms.  Patient and spouse educated on stroke symptoms and to call 911 if these symptoms do occur

## 2022-04-27 NOTE — Patient Instructions (Signed)
Recommend you stop b12 1000 mcg once daily.   Stop by the lab prior to leaving today. I will notify you of your results once received.   Regards,   Eugenia Pancoast FNP-C

## 2022-04-27 NOTE — Progress Notes (Unsigned)
Established Patient Office Visit  Subjective:   Patient ID: Kyle Simon, male    DOB: 08/01/1932  Age: 89 y.o. MRN: [id number]  CC:  Chief Complaint  Patient presents with   Medical Management of Chronic Issues    HPI: Kyle Simon is a 89 y.o. male presenting today for follow up.  HTN: During last visit patient started on Losartan '50mg'$  daily. States he doesn't really check BP at home, but when he does record BP it is 140-150s over 80. Denies chest pain, SOB, and palpitations.   Chronic diarrhea: patient has colonoscopy scheduled for March 6th.   Acute Concern: Patient reports that two weeks ago he was out to lunch and had an episode where he knew what he wanted to say but could not get the words out. Patient states episode lasted less than 5 minutes and has not happened since. Denies facial drooping, weakness, numbness/tingling, and vision changes during episode. Patient reports that he had not eaten lunch yet and it had been about 7 hours since he had breakfast. Did not take check blood glucose or blood pressure during visit. He takes a daily baby aspirin.     ROS: Negative unless specifically indicated above in HPI.   Relevant past medical history reviewed and updated as indicated.   Allergies and medications reviewed and updated.   Current Outpatient Medications:    acetaminophen (TYLENOL) 500 MG tablet, Take 1,000 mg by mouth every 6 (six) hours as needed for mild pain. Takes 2 tablets nightly, Disp: , Rfl:    ammonium lactate (AMLACTIN DAILY) 12 % lotion, Apply 1 Application topically as needed for dry skin., Disp: 400 g, Rfl: 0   aspirin 81 MG tablet, Take 81 mg by mouth every evening. , Disp: , Rfl:    Calcium Carb-Cholecalciferol (CALCIUM 500/VITAMIN D PO), Take 1 tablet by mouth daily., Disp: , Rfl:    celecoxib (CELEBREX) 200 MG capsule, Take 200 mg by mouth 2 (two) times daily., Disp: , Rfl:    CLOBETASOL PROPIONATE E 0.05 % emollient cream, as needed., Disp:  , Rfl:    clotrimazole-betamethasone (LOTRISONE) cream, Apply 1 Application topically daily., Disp: 30 g, Rfl: 0   diclofenac Sodium (VOLTAREN) 1 % GEL, Apply 2 g topically at bedtime., Disp: , Rfl:    doxycycline (VIBRAMYCIN) 100 MG capsule, Take 100 mg by mouth., Disp: , Rfl:    Ferrous Sulfate (IRON) 28 MG TABS, Take 1 each by mouth daily., Disp: , Rfl:    finasteride (PROSCAR) 5 MG tablet, Take 5 mg by mouth daily., Disp: , Rfl:    fludrocortisone (FLORINEF) 0.1 MG tablet, 1 tablet 3 days a week, Disp: 40 tablet, Rfl: 3   Hypromellose (ARTIFICIAL TEARS OP), Place 1 drop into both eyes 2 (two) times daily., Disp: , Rfl:    losartan (COZAAR) 50 MG tablet, Take 1 tablet (50 mg total) by mouth daily., Disp: 90 tablet, Rfl: 1   Multiple Vitamin (MULTIVITAMIN WITH MINERALS) TABS, Take 1 tablet by mouth daily., Disp: , Rfl:    Omega-3 Fatty Acids (FISH OIL) 1000 MG CAPS, Take 1,000 mg by mouth every evening., Disp: , Rfl:    polyethylene glycol (MIRALAX) 17 g packet, Take 17 g by mouth daily as needed for mild constipation or moderate constipation., Disp: 14 each, Rfl: 0   Probiotic Product (RESTORA) CAPS, One tab qd, Disp: 90 capsule, Rfl: 3   rosuvastatin (CRESTOR) 5 MG tablet, Take 1 tablet (5 mg total) by mouth at  bedtime., Disp: 90 tablet, Rfl: 3   SYNTHROID 137 MCG tablet, TAKE 1 TABLET DAILY BEFORE BREAKFAST, Disp: 90 tablet, Rfl: 3   tamsulosin (FLOMAX) 0.4 MG CAPS capsule, Take 0.4 mg by mouth 2 (two) times daily., Disp: , Rfl:   Allergies  Allergen Reactions   Simvastatin Other (See Comments)    MYALGIAS   Oxycodone Other (See Comments)    Severe constipation    Objective:   BP (!) 142/72   Pulse 60   Temp 97.6 F (36.4 C) (Temporal)   Ht '5\' 10"'$  (1.778 m)   Wt 184 lb 3.2 oz (83.6 kg)   SpO2 99%   BMI 26.43 kg/m    Physical Exam Vitals and nursing note reviewed.  Constitutional:      Appearance: Normal appearance. He is normal weight.  HENT:     Head: Normocephalic.   Eyes:     Extraocular Movements: Extraocular movements intact.     Pupils: Pupils are equal, round, and reactive to light.  Cardiovascular:     Rate and Rhythm: Normal rate and regular rhythm.     Pulses: Normal pulses.     Heart sounds: Normal heart sounds.  Pulmonary:     Effort: Pulmonary effort is normal.     Breath sounds: Normal breath sounds.  Skin:    General: Skin is warm and dry.  Neurological:     General: No focal deficit present.     Mental Status: He is alert and oriented to person, place, and time.     GCS: GCS eye subscore is 4. GCS verbal subscore is 5. GCS motor subscore is 6.     Cranial Nerves: Cranial nerves 2-12 are intact.     Sensory: Sensation is intact.     Motor: Motor function is intact.     Coordination: Coordination is intact.     Gait: Gait is intact.  Psychiatric:        Mood and Affect: Mood normal.        Behavior: Behavior normal.        Thought Content: Thought content normal.        Judgment: Judgment normal.     Assessment & Plan:  Chronic diarrhea Assessment & Plan: Patient to keep scheduled appointment for colonoscopy on March 6th. CBC ordered and pending.   Orders: -     CBC with Differential/Platelet  Lethargy Assessment & Plan: CBC ordered and pending. Patient advised if episode of speech difficulty occurs again to take blood pressure, check blood glucose, and monitor for stroke symptoms.  Patient and spouse educated on stroke symptoms and to call 911 if these symptoms do occur  Orders: -     CBC with Differential/Platelet  Hypothyroidism, unspecified type Assessment & Plan: Continue taking Synthroid 159mg daily.    Hypertension associated with diabetes ([facility] Assessment & Plan: Continue to take Losartan '50mg'$  daily.  Advised patient to keep record of blood pressures at home.  Microalbumin ordered and pending.   Orders: -     Microalbumin / creatinine urine ratio     Follow up plan: No follow-ups on  file.  TJohny Shock RN AGNP-student

## 2022-04-29 DIAGNOSIS — M25561 Pain in right knee: Secondary | ICD-10-CM | POA: Diagnosis not present

## 2022-04-29 DIAGNOSIS — M25562 Pain in left knee: Secondary | ICD-10-CM | POA: Diagnosis not present

## 2022-04-29 DIAGNOSIS — R2681 Unsteadiness on feet: Secondary | ICD-10-CM | POA: Diagnosis not present

## 2022-05-03 DIAGNOSIS — M25561 Pain in right knee: Secondary | ICD-10-CM | POA: Diagnosis not present

## 2022-05-03 DIAGNOSIS — M25562 Pain in left knee: Secondary | ICD-10-CM | POA: Diagnosis not present

## 2022-05-03 DIAGNOSIS — R2681 Unsteadiness on feet: Secondary | ICD-10-CM | POA: Diagnosis not present

## 2022-05-04 ENCOUNTER — Encounter: Payer: Self-pay | Admitting: Gastroenterology

## 2022-05-05 ENCOUNTER — Encounter
Admission: RE | Disposition: A | Discharge status: Home / Self Care | Payer: Self-pay | Source: Ambulatory Visit | Attending: Gastroenterology

## 2022-05-05 ENCOUNTER — Encounter: Payer: Self-pay | Admitting: Gastroenterology

## 2022-05-05 ENCOUNTER — Ambulatory Visit: Payer: Medicare Other | Admitting: Certified Registered Nurse Anesthetist

## 2022-05-05 ENCOUNTER — Ambulatory Visit: Payer: Medicare Other | Admitting: Dermatology

## 2022-05-05 ENCOUNTER — Ambulatory Visit
Admission: RE | Admit: 2022-05-05 | Discharge: 2022-05-05 | Disposition: A | Discharge status: Home / Self Care | Payer: Medicare Other | Source: Ambulatory Visit | Attending: Gastroenterology | Admitting: Gastroenterology

## 2022-05-05 DIAGNOSIS — K529 Noninfective gastroenteritis and colitis, unspecified: Secondary | ICD-10-CM | POA: Diagnosis not present

## 2022-05-05 DIAGNOSIS — R195 Other fecal abnormalities: Secondary | ICD-10-CM | POA: Diagnosis not present

## 2022-05-05 DIAGNOSIS — Z538 Procedure and treatment not carried out for other reasons: Secondary | ICD-10-CM | POA: Diagnosis not present

## 2022-05-05 HISTORY — PX: COLONOSCOPY WITH PROPOFOL: SHX5780

## 2022-05-05 SURGERY — COLONOSCOPY WITH PROPOFOL
Anesthesia: General

## 2022-05-05 NOTE — Progress Notes (Signed)
Bowel prep instructions for miralax  Oral: 238 g (8.3 oz bottle) or 255 g (8.9 oz bottle [generic]) mixed in ~1,900 mL (64 ounces) of Gatorade until dissolved (lemon-lime is typically used; do not use red).   Oral: 240 mL (8 oz) every 20-25mn minutes until ~1900 (64 oz) (entire contents of container) is consumed   RSherri Sear MD

## 2022-05-05 NOTE — Anesthesia Preprocedure Evaluation (Signed)
Anesthesia Evaluation  Patient identified by MRN, date of birth, ID band Patient awake    Reviewed: Allergy & Precautions, NPO status , Patient's Chart, lab work & pertinent test results  Airway Mallampati: III  TM Distance: >3 FB Neck ROM: full    Dental  (+) Edentulous Upper,    Pulmonary neg pulmonary ROS, former smoker   Pulmonary exam normal  + decreased breath sounds      Cardiovascular Exercise Tolerance: Good hypertension, Pt. on medications + CAD  negative cardio ROS Normal cardiovascular exam Rhythm:Regular     Neuro/Psych negative neurological ROS  negative psych ROS   GI/Hepatic negative GI ROS, Neg liver ROS,GERD  Medicated,,  Endo/Other  negative endocrine ROSdiabetes, Well Controlled, Type 2Hypothyroidism    Renal/GU CRFRenal diseasenegative Renal ROS  negative genitourinary   Musculoskeletal  (+) Arthritis ,    Abdominal Normal abdominal exam  (+)   Peds negative pediatric ROS (+)  Hematology negative hematology ROS (+) Blood dyscrasia, anemia   Anesthesia Other Findings Past Medical History: No date: Arthritis     Comment:  both hands;Dr.Deveshwar No date: Constipation due to pain medication No date: Diabetes mellitus without complication (HCC)     Comment:  Type II. Uses no medication. Pt controls with diet and               weight No date: Enlarged prostate No date: Frequent urination at night No date: GERD (gastroesophageal reflux disease) 11/20/2018: History of noncompliance with medical treatment,  presenting hazards to health 04/19/2007: Hyperlipidemia associated with type 2 diabetes mellitus  ([place])     Comment:  Qualifier: Diagnosis of  By: Linna Darner MD, Gwyndolyn Saxon   No date: Hypertension No date: Hypothyroidism No date: Polio     Comment:  as a child w/o complications No date: Snoring     Comment:  no sleep apnea 11/20/2018: Statin declined-      Comment:  patient understands risks  associated with this decision               and has been advised to restart by cardiologist as well               as myself several times No date: Torn meniscus 2012: Transfusion history     Comment:  post TKR  Past Surgical History: 11/03/2017: ANTERIOR LAT LUMBAR FUSION; Right     Comment:  Procedure: Right Lumbar three-four Anterolateral lumbar               interbody fusion with lateral plate;  Surgeon: Erline Levine, MD;  Location: [place];  Service: Neurosurgery;                Laterality: Right; 2011: BACK SURGERY     Comment:  hemiarthroplasty 01/11/1999 and 6 09/2001: CARDIAC CATHETERIZATION     Comment:  negative No date: COLONOSCOPY 2003: EYE SURGERY     Comment:  R&L cataract removed & IOL- 2003 & 2007,Dr.Jenkins 2002: INGUINAL HERNIA REPAIR     Comment:  right, Dr. Truitt Leep 2012: JOINT REPLACEMENT     Comment:  left knee replacement No date: LIPOMA EXCISION     Comment:  back No date: MENISCECTOMY; Bilateral     Comment:  Dr. Noemi Chapel 07/2006: PROSTATE SURGERY     Comment:  for urinary urgency, Dr. Risa Grill October, 2016: [place]; Right 1995: sinus & throat surgery-1995 No date: TONSILLECTOMY 01/11/2011: TOTAL  KNEE ARTHROPLASTY     Comment:  Procedure: TOTAL KNEE ARTHROPLASTY;  Surgeon: Rudean Haskell, MD;  Location: [place];  Service: Orthopedics;                Laterality: Left; 11/22/2016: TOTAL KNEE ARTHROPLASTY; Right     Comment:  Procedure: TOTAL KNEE ARTHROPLASTY;  Surgeon: Vickey Huger, MD;  Location: [place];  Service: Orthopedics;                Laterality: Right; 1996: UVULECTOMY     Comment:  Dr. Erik Obey No date: VASECTOMY 1959: WRIST SURGERY     Comment:  fracture- right 09/07/2020: XI ROBOTIC ASSISTED VENTRAL HERNIA; N/A     Comment:  Procedure: XI ROBOTIC ASSISTED DIAGNOSTIC LAPAROSCOPY;                Surgeon: Jules Husbands, MD;  Location: [facility];                Service: General;  Laterality:  N/A;     Reproductive/Obstetrics negative OB ROS                             Anesthesia Physical Anesthesia Plan  ASA: 3  Anesthesia Plan: General   Post-op Pain Management:    Induction: Intravenous  PONV Risk Score and Plan: Propofol infusion and TIVA  Airway Management Planned: Natural Airway  Additional Equipment:   Intra-op Plan:   Post-operative Plan:   Informed Consent: I have reviewed the patients History and Physical, chart, labs and discussed the procedure including the risks, benefits and alternatives for the proposed anesthesia with the patient or authorized representative who has indicated his/her understanding and acceptance.     Dental Advisory Given  Plan Discussed with: CRNA and Surgeon  Anesthesia Plan Comments:        Anesthesia Quick Evaluation

## 2022-05-05 NOTE — OR Nursing (Signed)
PT ARRIVED TO ENDO FOR COLONOSCOPY. WENT TO BATHROOM. HAD LARGE LOOSE BROWN TO BLACK STOOL. PT SPOKE WITH  PT AND WIFE. PT ONLY HAD 5 STOOLS AFTER DRINKING PREP. DR Marius Ditch SPOKE WITH BOTH OF THEM . PT TO BE RESCHEDULED FOR TOMORROW  WITH [place]

## 2022-05-06 ENCOUNTER — Encounter: Payer: Self-pay | Admitting: Gastroenterology

## 2022-05-06 ENCOUNTER — Ambulatory Visit
Admission: RE | Admit: 2022-05-06 | Discharge: 2022-05-06 | Disposition: A | Discharge status: Home / Self Care | Payer: Medicare Other | Attending: Gastroenterology | Admitting: Gastroenterology

## 2022-05-06 ENCOUNTER — Ambulatory Visit: Payer: Medicare Other | Admitting: Anesthesiology

## 2022-05-06 ENCOUNTER — Encounter
Admission: RE | Disposition: A | Discharge status: Home / Self Care | Payer: Self-pay | Source: Home / Self Care | Attending: Gastroenterology

## 2022-05-06 DIAGNOSIS — Z87891 Personal history of nicotine dependence: Secondary | ICD-10-CM | POA: Insufficient documentation

## 2022-05-06 DIAGNOSIS — I2510 Atherosclerotic heart disease of native coronary artery without angina pectoris: Secondary | ICD-10-CM | POA: Insufficient documentation

## 2022-05-06 DIAGNOSIS — N400 Enlarged prostate without lower urinary tract symptoms: Secondary | ICD-10-CM | POA: Insufficient documentation

## 2022-05-06 DIAGNOSIS — K219 Gastro-esophageal reflux disease without esophagitis: Secondary | ICD-10-CM | POA: Diagnosis not present

## 2022-05-06 DIAGNOSIS — K5730 Diverticulosis of large intestine without perforation or abscess without bleeding: Secondary | ICD-10-CM | POA: Insufficient documentation

## 2022-05-06 DIAGNOSIS — K529 Noninfective gastroenteritis and colitis, unspecified: Secondary | ICD-10-CM | POA: Diagnosis not present

## 2022-05-06 DIAGNOSIS — Z8612 Personal history of poliomyelitis: Secondary | ICD-10-CM | POA: Insufficient documentation

## 2022-05-06 DIAGNOSIS — E119 Type 2 diabetes mellitus without complications: Secondary | ICD-10-CM | POA: Diagnosis not present

## 2022-05-06 DIAGNOSIS — R195 Other fecal abnormalities: Secondary | ICD-10-CM

## 2022-05-06 DIAGNOSIS — K52838 Other microscopic colitis: Secondary | ICD-10-CM | POA: Diagnosis not present

## 2022-05-06 DIAGNOSIS — R350 Frequency of micturition: Secondary | ICD-10-CM | POA: Insufficient documentation

## 2022-05-06 DIAGNOSIS — E785 Hyperlipidemia, unspecified: Secondary | ICD-10-CM | POA: Insufficient documentation

## 2022-05-06 DIAGNOSIS — E039 Hypothyroidism, unspecified: Secondary | ICD-10-CM | POA: Insufficient documentation

## 2022-05-06 DIAGNOSIS — Z09 Encounter for follow-up examination after completed treatment for conditions other than malignant neoplasm: Secondary | ICD-10-CM | POA: Diagnosis not present

## 2022-05-06 DIAGNOSIS — I10 Essential (primary) hypertension: Secondary | ICD-10-CM | POA: Diagnosis not present

## 2022-05-06 HISTORY — PX: COLONOSCOPY: SHX5424

## 2022-05-06 SURGERY — COLONOSCOPY
Anesthesia: General

## 2022-05-06 MED ORDER — PROPOFOL 500 MG/50ML IV EMUL
INTRAVENOUS | Status: DC | PRN
  Administered 2022-05-06: 75 ug/kg/min via INTRAVENOUS

## 2022-05-06 MED ORDER — SODIUM CHLORIDE 0.9 % IV SOLN: INTRAVENOUS | Status: DC

## 2022-05-06 MED ORDER — GLYCOPYRROLATE 0.2 MG/ML IJ SOLN
INTRAMUSCULAR | Status: DC | PRN
  Administered 2022-05-06 (×2): .2 mg via INTRAVENOUS

## 2022-05-06 MED ORDER — GLYCOPYRROLATE 0.2 MG/ML IJ SOLN
INTRAMUSCULAR | Status: AC
  Filled 2022-05-06: qty 1

## 2022-05-06 MED ORDER — LIDOCAINE HCL (CARDIAC) PF 100 MG/5ML IV SOSY
PREFILLED_SYRINGE | INTRAVENOUS | Status: DC | PRN
  Administered 2022-05-06: 40 mg via INTRAVENOUS

## 2022-05-06 MED ORDER — PROPOFOL 10 MG/ML IV BOLUS
INTRAVENOUS | Status: DC | PRN
  Administered 2022-05-06 (×2): 40 mg via INTRAVENOUS

## 2022-05-06 NOTE — Op Note (Signed)
[facility]astroenterology Patient Name: Kyle Simon Procedure Date: 05/06/2022 9:30 AM MRN: [id number] Account #: [id number] Date of Birth: Jan 13, 1933 Admit Type: Outpatient Age: 89 Room: [facility] ENDO ROOM 3 Gender: Male Note Status: Finalized Instrument Name: Peds Colonoscope [id number] Procedure:             Colonoscopy Indications:           Chronic diarrhea, , elevated fecal calprotectin levels Providers:             Lin Landsman MD, MD Referring MD:          Eugenia Pancoast (Referring MD) Medicines:             General Anesthesia Complications:         No immediate complications. Estimated blood loss: None. Procedure:             Pre-Anesthesia Assessment:                        - Prior to the procedure, a History and Physical was                         performed, and patient medications and allergies were                         reviewed. The patient is competent. The risks and                         benefits of the procedure and the sedation options and                         risks were discussed with the patient. All questions                         were answered and informed consent was obtained.                         Patient identification and proposed procedure were                         verified by the physician, the nurse, the                         anesthesiologist, the anesthetist and the technician                         in the pre-procedure area in the procedure room in the                         endoscopy suite. Mental Status Examination: alert and                         oriented. Airway Examination: normal oropharyngeal                         airway and neck mobility. Respiratory Examination:                         clear to auscultation. CV Examination: normal.  Prophylactic Antibiotics: The patient does not require                         prophylactic antibiotics. Prior Anticoagulants: The                          patient has taken no anticoagulant or antiplatelet                         agents. ASA Grade Assessment: III - A patient with                         severe systemic disease. After reviewing the risks and                         benefits, the patient was deemed in satisfactory                         condition to undergo the procedure. The anesthesia                         plan was to use general anesthesia. Immediately prior                         to administration of medications, the patient was                         re-assessed for adequacy to receive sedatives. The                         heart rate, respiratory rate, oxygen saturations,                         blood pressure, adequacy of pulmonary ventilation, and                         response to care were monitored throughout the                         procedure. The physical status of the patient was                         re-assessed after the procedure.                        After obtaining informed consent, the colonoscope was                         passed under direct vision. Throughout the procedure,                         the patient's blood pressure, pulse, and oxygen                         saturations were monitored continuously. The                         Colonoscope was introduced through the anus and  advanced to the the terminal ileum, with                         identification of the appendiceal orifice and IC                         valve. The colonoscopy was performed without                         difficulty. The patient tolerated the procedure well.                         The quality of the bowel preparation was good. The                         terminal ileum, ileocecal valve, appendiceal orifice,                         and rectum were photographed. Findings:      The perianal and digital rectal examinations were normal. Pertinent       negatives include normal  sphincter tone and no palpable rectal lesions.      Normal mucosa was found in the left colon and in the right colon.       Biopsies for histology were taken with a cold forceps from the entire       colon for evaluation of microscopic colitis. Estimated blood loss: none.      Multiple diverticula were found in the recto-sigmoid colon and sigmoid       colon.      The retroflexed view of the distal rectum and anal verge was normal and       showed no anal or rectal abnormalities. Impression:            - Normal mucosa in the left colon and in the right                         colon. Biopsied.                        - Diverticulosis in the recto-sigmoid colon and in the                         sigmoid colon.                        - The distal rectum and anal verge are normal on                         retroflexion view. Recommendation:        - Discharge patient to home (with spouse).                        - Resume previous diet today.                        - Continue present medications.                        - Await pathology results.                        -  Return to my office as previously scheduled. Procedure Code(s):     --- Professional ---                        [phone], Colonoscopy, flexible; with biopsy, single or                         multiple Diagnosis Code(s):     --- Professional ---                        K52.9, Noninfective gastroenteritis and colitis,                         unspecified                        K57.30, Diverticulosis of large intestine without                         perforation or abscess without bleeding CPT copyright 2022 American Medical Association. All rights reserved. The codes documented in this report are preliminary and upon coder review may  be revised to meet current compliance requirements. Dr. Ulyess Mort Lin Landsman MD, MD 05/06/2022 10:01:59 AM This report has been signed electronically. Number of Addenda: 0 Note Initiated  On: 05/06/2022 9:30 AM Scope Withdrawal Time: 0 hours 12 minutes 28 seconds  Total Procedure Duration: 0 hours 15 minutes 47 seconds  Estimated Blood Loss:  Estimated blood loss: none.      [facility]

## 2022-05-06 NOTE — H&P (Signed)
Cephas Darby, MD [address]  [place]  [place], [place] [address]  Main: [phone]  Fax: [phone] Pager: [phone]  Primary Care Physician:  Eugenia Pancoast, FNP Primary Gastroenterologist:  Dr. Cephas Darby  Pre-Procedure History & Physical: HPI:  Kyle Simon is a 89 y.o. male is here for an colonoscopy.   Past Medical History:  Diagnosis Date   Arthritis    both hands;Dr.Deveshwar   Constipation due to pain medication    Diabetes mellitus without complication ([place])    Type II. Uses no medication. Pt controls with diet and weight   Enlarged prostate    Frequent urination at night    GERD (gastroesophageal reflux disease)    History of noncompliance with medical treatment, presenting hazards to health 11/20/2018   Hyperlipidemia associated with type 2 diabetes mellitus ([place]) 04/19/2007   Qualifier: Diagnosis of  By: Linna Darner MD, [place]     Hypertension    Hypothyroidism    Polio    as a child w/o complications   Snoring    no sleep apnea   Statin declined-  11/20/2018   patient understands risks associated with this decision and has been advised to restart by cardiologist as well as myself several times   Torn meniscus    Transfusion history 2012   post TKR    Past Surgical History:  Procedure Laterality Date   ANTERIOR LAT LUMBAR FUSION Right 11/03/2017   Procedure: Right Lumbar three-four Anterolateral lumbar interbody fusion with lateral plate;  Surgeon: Erline Levine, MD;  Location: [place];  Service: Neurosurgery;  Laterality: Right;   BACK SURGERY  2011   hemiarthroplasty 01/11/1999 and 6   CARDIAC CATHETERIZATION  09/2001   negative   COLONOSCOPY     EYE SURGERY  2003   R&L cataract removed & IOL- 2003 & 2007,Dr.Jenkins   INGUINAL HERNIA REPAIR  2002   right, Dr. Truitt Leep   JOINT REPLACEMENT  2012   left knee replacement   LIPOMA EXCISION     back   MENISCECTOMY Bilateral    Dr. Noemi Chapel   PROSTATE SURGERY  07/2006   for urinary  urgency, Dr. Everardo Pacific CUFF REPAIR Right October, 2016   sinus & throat surgery-1995  1995   TONSILLECTOMY     TOTAL KNEE ARTHROPLASTY  01/11/2011   Procedure: TOTAL KNEE ARTHROPLASTY;  Surgeon: Rudean Haskell, MD;  Location: [place];  Service: Orthopedics;  Laterality: Left;   TOTAL KNEE ARTHROPLASTY Right 11/22/2016   Procedure: TOTAL KNEE ARTHROPLASTY;  Surgeon: Vickey Huger, MD;  Location: [place];  Service: Orthopedics;  Laterality: Right;   UVULECTOMY  [id number]   Dr. Erik Obey   [place]   fracture- right   XI ROBOTIC ASSISTED VENTRAL HERNIA N/A 09/07/2020   Procedure: XI ROBOTIC ASSISTED DIAGNOSTIC LAPAROSCOPY;  Surgeon: Jules Husbands, MD;  Location: [facility];  Service: General;  Laterality: N/A;    Prior to Admission medications   Medication Sig Start Date End Date Taking? Authorizing Provider  albuterol (VENTOLIN HFA) 108 (90 Base) MCG/ACT inhaler Inhale into the lungs every 4 (four) hours as needed for wheezing or shortness of breath.   Yes [provider]  glucosamine-chondroitin 500-400 MG tablet Take 1 tablet by mouth daily.   Yes [provider]  ibuprofen (ADVIL) 600 MG tablet Take 600 mg by mouth every 8 (eight) hours as needed.   Yes [provider]  linaclotide Rolan Lipa) 72  MCG capsule Take 72 mcg by mouth daily before breakfast.   Yes [provider]  traMADol (ULTRAM) 50 MG tablet Take by mouth every 6 (six) hours as needed.   Yes [provider]  acetaminophen (TYLENOL) 500 MG tablet Take 1,000 mg by mouth every 6 (six) hours as needed for mild pain. Takes 2 tablets nightly    [provider]  ammonium lactate (AMLACTIN DAILY) 12 % lotion Apply 1 Application topically as needed for dry skin. 03/30/22   Felipa Furnace, DPM  aspirin 81 MG tablet Take 81 mg by mouth every evening.     [provider]  Calcium Carb-Cholecalciferol (CALCIUM 500/VITAMIN D PO) Take 1 tablet by mouth daily.     [provider]  celecoxib (CELEBREX) 200 MG capsule Take 200 mg by mouth 2 (two) times daily. 09/18/19   [provider]  CLOBETASOL PROPIONATE E 0.05 % emollient cream as needed. 07/09/21   [provider]  clotrimazole-betamethasone (LOTRISONE) cream Apply 1 Application topically daily. 04/09/22   Eugenia Pancoast, FNP  diclofenac Sodium (VOLTAREN) 1 % GEL Apply 2 g topically at bedtime. 09/10/19   [provider]  doxycycline (VIBRAMYCIN) 100 MG capsule Take 100 mg by mouth. 04/05/22   [provider]  Ferrous Sulfate (IRON) 28 MG TABS Take 1 each by mouth daily.    [provider]  finasteride (PROSCAR) 5 MG tablet Take 5 mg by mouth daily. 07/09/21   [provider]  fludrocortisone (FLORINEF) 0.1 MG tablet 1 tablet 3 days a week 08/20/21   Elayne Snare, MD  Hypromellose (ARTIFICIAL TEARS OP) Place 1 drop into both eyes 2 (two) times daily.    [provider]  losartan (COZAAR) 50 MG tablet Take 1 tablet (50 mg total) by mouth daily. 04/09/22   Eugenia Pancoast, FNP  Multiple Vitamin (MULTIVITAMIN WITH MINERALS) TABS Take 1 tablet by mouth daily.    [provider]  Omega-3 Fatty Acids (FISH OIL) 1000 MG CAPS Take 1,000 mg by mouth every evening.    [provider]  polyethylene glycol (MIRALAX) 17 g packet Take 17 g by mouth daily as needed for mild constipation or moderate constipation. 01/07/22   Eugenia Pancoast, FNP  Probiotic Product Mathis Bud) CAPS One tab qd 07/25/15   Opalski, Neoma Laming, DO  rosuvastatin (CRESTOR) 5 MG tablet Take 1 tablet (5 mg total) by mouth at bedtime. 07/31/21   Waunita Schooner, MD  SYNTHROID 137 MCG tablet TAKE 1 TABLET DAILY BEFORE BREAKFAST 01/06/22   Elayne Snare, MD  tamsulosin (FLOMAX) 0.4 MG CAPS capsule Take 0.4 mg by mouth 2 (two) times daily.    [provider]    Allergies as of 05/05/2022 - Review Complete 05/05/2022  Allergen Reaction Noted   Simvastatin Other (See Comments)  04/18/2007   Oxycodone Other (See Comments) 10/11/2013    Family History  Problem Relation Age of Onset   Coronary artery disease Mother    Emphysema Mother    Diverticulosis Mother    Thyroid disease Mother    Sudden death Father        accident   Asthma Brother    Bone cancer Brother    Thyroid disease Daughter    Sarcoidosis Son    Diabetes Paternal Grandmother    Anesthesia problems Neg Hx    Hypotension Neg Hx    Malignant hyperthermia Neg Hx    Pseudochol deficiency Neg Hx    Colon cancer Neg Hx  Social History   Socioeconomic History   Marital status: Married    Spouse name: June   Number of children: 7   Years of education: 2 years college   Highest education level: Not on file  Occupational History   Occupation: retired  Tobacco Use   Smoking status: Former    Packs/day: 1.00    Years: 18.00    Total pack years: 18.00    Types: Cigarettes    Quit date: 03/01/1966    Years since quitting: 56.2    Passive exposure: Never   Smokeless tobacco: Never  Vaping Use   Vaping Use: Never used  Substance and Sexual Activity   Alcohol use: Yes    Alcohol/week: 4.0 standard drinks of alcohol    Types: 4 Shots of liquor per week    Comment: social, few drinks a week   Drug use: No   Sexual activity: Yes    Birth control/protection: Post-menopausal, Surgical  Other Topics Concern   Not on file  Social History Narrative   09/20/19   From: [place]   Living: with wife June (1985)   Work: retired from Social research officer, government, 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 [employer]      Family: 5 children (one passed away) 2 stepchildren, 14 grandchildren, 9 great-grandchildren - Psychiatrist and granddaughter nearby otherwise across the country       Enjoys: Simon and play golf, reading      Exercise: walking the dog   Diet: diabetic diet      Safety   Seat belts: Yes    Guns: Yes  and secure   Safe in relationships: Yes    Social Determinants of Adult nurse Strain: Low Risk  (09/07/2021)   Overall Financial Resource Strain (CARDIA)    Difficulty of Paying Living Expenses: Not hard at all  Food Insecurity: No Food Insecurity (09/07/2021)   Hunger Vital Sign    Worried About Running Out of Food in the Last Year: Never true    [place] in the Last Year: Never true  Transportation Needs: No Transportation Needs (09/07/2021)   PRAPARE - Hydrologist (Medical): No    Lack of Transportation (Non-Medical): No  Physical Activity: Insufficiently Active (09/07/2021)   Exercise Vital Sign    Days of Exercise per Week: 2 days    Minutes of Exercise per Session: 20 min  Stress: No Stress Concern Present (09/07/2021)   [place]    Feeling of Stress : Not at all  Social Connections: Moderately Integrated (09/07/2021)   Social Connection and Isolation Panel [NHANES]    Frequency of Communication with Friends and Family: Once a week    Frequency of Social Gatherings with Friends and Family: More than three times a week    Attends Religious Services: More than 4 times per year    Active Member of [employer] or Organizations: No    Attends Archivist Meetings: Never    Marital Status: Married  Human resources officer Violence: Not At Risk (09/07/2021)   Humiliation, Afraid, Rape, and Kick questionnaire    Fear of Current or Ex-Partner: No    Emotionally Abused: No    Physically Abused: No    Sexually Abused: No    Review of Systems: See HPI, otherwise negative ROS  Physical Exam: BP (!) 178/71   Pulse (!) 44   Temp (!) 96.8 F (36  C) (Temporal)   Resp 16   Ht '5\' 10"'$  (1.778 m)   Wt 78.9 kg   SpO2 99%   BMI 24.97 kg/m  General:   Alert,  pleasant and cooperative in NAD Head:  Normocephalic and atraumatic. Neck:  Supple; no masses or thyromegaly. Lungs:  Clear throughout to auscultation.    Heart:  Regular rate and  rhythm. Abdomen:  Soft, nontender and nondistended. Normal bowel sounds, without guarding, and without rebound.   Neurologic:  Alert and  oriented x4;  grossly normal neurologically.  Impression/Plan: Kyle Simon is here for an colonoscopy to be performed for chronic diarrhea, elevated fecal calprotectin levels  Risks, benefits, limitations, and alternatives regarding  colonoscopy have been reviewed with the patient.  Questions have been answered.  All parties agreeable.   Sherri Sear, MD  05/06/2022, 9:35 AM

## 2022-05-06 NOTE — Anesthesia Postprocedure Evaluation (Signed)
Anesthesia Post Note  Patient: Kyle Simon  Procedure(s) Performed: COLONOSCOPY  Patient location during evaluation: Endoscopy Anesthesia Type: General Level of consciousness: awake and alert Pain management: pain level controlled Vital Signs Assessment: post-procedure vital signs reviewed and stable Respiratory status: spontaneous breathing, nonlabored ventilation and respiratory function stable Cardiovascular status: blood pressure returned to baseline and stable Postop Assessment: no apparent nausea or vomiting Anesthetic complications: no   No notable events documented.   Last Vitals:  Vitals:   05/06/22 0859 05/06/22 1001  BP: (!) 178/71 128/68  Pulse: (!) 44 (!) 53  Resp: 16 12  Temp: (!) 36 C (!) 36.4 C  SpO2: 99% 100%    Last Pain:  Vitals:   05/06/22 1021  TempSrc:   PainSc: 0-No pain                 Alphonsus Sias

## 2022-05-06 NOTE — Anesthesia Procedure Notes (Signed)
Procedure Name: General with mask airway Date/Time: 05/06/2022 9:35 AM  Performed by: Hilbert Odor, CRNAPre-anesthesia Checklist: Patient identified, Emergency Drugs available, Suction available, Patient being monitored and Timeout performed Patient Re-evaluated:Patient Re-evaluated prior to induction Oxygen Delivery Method: Nasal cannula Induction Type: IV induction

## 2022-05-06 NOTE — Transfer of Care (Signed)
Immediate Anesthesia Transfer of Care Note  Patient: Kyle Simon  Procedure(s) Performed: COLONOSCOPY  Patient Location: PACU and Endoscopy Unit  Anesthesia Type:General  Level of Consciousness: drowsy and patient cooperative  Airway & Oxygen Therapy: Patient Spontanous Breathing and Patient connected to nasal cannula oxygen  Post-op Assessment: Report given to RN and Patient moving all extremities  Post vital signs: Reviewed and stable  Last Vitals:  Vitals Value Taken Time  BP 128/68 05/06/22 1002  Temp    Pulse 53 05/06/22 1002  Resp 12 05/06/22 1002  SpO2 100 % 05/06/22 1002  Vitals shown include unvalidated device data.  Last Pain:  Vitals:   05/06/22 0859  TempSrc: Temporal  PainSc: 0-No pain         Complications: No notable events documented.

## 2022-05-06 NOTE — Anesthesia Preprocedure Evaluation (Addendum)
Anesthesia Evaluation  Patient identified by MRN, date of birth, ID band Patient awake    Reviewed: Allergy & Precautions, NPO status , Patient's Chart, lab work & pertinent test results  Airway Mallampati: III  TM Distance: >3 FB Neck ROM: full    Dental  (+) Chipped, Edentulous Upper, Caps   Pulmonary former smoker   Pulmonary exam normal        Cardiovascular hypertension, + CAD  Normal cardiovascular exam  2012 Study Conclusions   - Stress ECG conclusions: Significant artifact in ECG tracing with    exercise. 69m ST segment depression in inferolateral leads at peak    exercise  - Impressions: Low risk stress echo. Abnormal ECG response. Normal    images but stress images seem to have been captured at relatively    low HR which decreases sensitivity of test  Impressions:   - Low risk stress echo. Abnormal ECG response. Normal images but    stress images seem to have been captured at relatively low HR    which decreases sensitivity of test     Neuro/Psych  Neuromuscular disease  negative psych ROS   GI/Hepatic Neg liver ROS,GERD  ,,  Endo/Other  diabetes, Type 2Hypothyroidism    Renal/GU Renal disease  negative genitourinary   Musculoskeletal  (+) Arthritis ,    Abdominal   Peds  Hematology  (+) Blood dyscrasia, anemia   Anesthesia Other Findings Past Medical History: No date: Arthritis     Comment:  both hands;Dr.Deveshwar No date: Constipation due to pain medication No date: Diabetes mellitus without complication (HCC)     Comment:  Type II. Uses no medication. Pt controls with diet and               weight No date: Enlarged prostate No date: Frequent urination at night No date: GERD (gastroesophageal reflux disease) 11/20/2018: History of noncompliance with medical treatment,  presenting hazards to health 04/19/2007: Hyperlipidemia associated with type 2 diabetes mellitus  [place]     Comment:   Qualifier: Diagnosis of  By: HLinna DarnerMD, WGwyndolyn Saxon  No date: Hypertension No date: Hypothyroidism No date: Polio     Comment:  as a child w/o complications No date: Snoring     Comment:  no sleep apnea 11/20/2018: Statin declined-      Comment:  patient understands risks associated with this decision               and has been advised to restart by cardiologist as well               as myself several times No date: Torn meniscus 2012: Transfusion history     Comment:  post TKR  Past Surgical History: 11/03/2017: ANTERIOR LAT LUMBAR FUSION; Right     Comment:  Procedure: Right Lumbar three-four Anterolateral lumbar               interbody fusion with lateral plate;  Surgeon: SErline Levine MD;  [place]  Service: Neurosurgery;                Laterality: Right; 2011: BACK SURGERY     Comment:  hemiarthroplasty 01/11/1999 and 6 09/2001: CARDIAC CATHETERIZATION     Comment:  negative No date: COLONOSCOPY 2003: EYE SURGERY     Comment:  R&L cataract removed & IOL- 2003 & 2007,Dr.Jenkins 2002: INGUINAL HERNIA REPAIR  Comment:  right, Dr. Truitt Leep 2012: JOINT REPLACEMENT     Comment:  left knee replacement No date: LIPOMA EXCISION     Comment:  back No date: MENISCECTOMY; Bilateral     Comment:  Dr. Noemi Chapel 07/2006: PROSTATE SURGERY     Comment:  for urinary urgency, Dr. Risa Grill October, 2016: [place]; Right 1995: sinus & throat surgery-1995 No date: TONSILLECTOMY 01/11/2011: TOTAL KNEE ARTHROPLASTY     Comment:  Procedure: TOTAL KNEE ARTHROPLASTY;  Surgeon: Rudean Haskell, MD;  Location: [place];  Service: Orthopedics;                Laterality: Left; 11/22/2016: TOTAL KNEE ARTHROPLASTY; Right     Comment:  Procedure: TOTAL KNEE ARTHROPLASTY;  Surgeon: Vickey Huger, MD;  Location: [place];  Service: Orthopedics;                Laterality: Right; 1996: UVULECTOMY     Comment:  Dr. Erik Obey No date: VASECTOMY 1959:  WRIST SURGERY     Comment:  fracture- right 09/07/2020: XI ROBOTIC ASSISTED VENTRAL HERNIA; N/A     Comment:  Procedure: XI ROBOTIC ASSISTED DIAGNOSTIC LAPAROSCOPY;                Surgeon: Jules Husbands, MD;  Location: [facility];                Service: General;  Laterality: N/A;     Reproductive/Obstetrics negative OB ROS                             Anesthesia Physical Anesthesia Plan  ASA: 3  Anesthesia Plan: General   Post-op Pain Management:    Induction: Intravenous  PONV Risk Score and Plan: Propofol infusion and TIVA  Airway Management Planned: Natural Airway and Nasal Cannula  Additional Equipment:   Intra-op Plan:   Post-operative Plan:   Informed Consent: I have reviewed the patients History and Physical, chart, labs and discussed the procedure including the risks, benefits and alternatives for the proposed anesthesia with the patient or authorized representative who has indicated his/her understanding and acceptance.     Dental Advisory Given  Plan Discussed with: Anesthesiologist, CRNA and Surgeon  Anesthesia Plan Comments: (Patient consented for risks of anesthesia including but not limited to:  - adverse reactions to medications - risk of airway placement if required - damage to eyes, teeth, lips or other oral mucosa - nerve damage due to positioning  - sore throat or hoarseness - Damage to heart, brain, nerves, lungs, other parts of body or loss of life  Patient voiced understanding.)        Anesthesia Quick Evaluation

## 2022-05-07 ENCOUNTER — Encounter: Payer: Self-pay | Admitting: Anesthesiology

## 2022-05-07 ENCOUNTER — Encounter: Payer: Self-pay | Admitting: Gastroenterology

## 2022-05-07 DIAGNOSIS — M25562 Pain in left knee: Secondary | ICD-10-CM | POA: Diagnosis not present

## 2022-05-07 DIAGNOSIS — R2681 Unsteadiness on feet: Secondary | ICD-10-CM | POA: Diagnosis not present

## 2022-05-07 DIAGNOSIS — M25561 Pain in right knee: Secondary | ICD-10-CM | POA: Diagnosis not present

## 2022-05-07 LAB — SURGICAL PATHOLOGY

## 2022-05-07 NOTE — Progress Notes (Signed)
noted 

## 2022-05-10 ENCOUNTER — Telehealth: Payer: Self-pay

## 2022-05-10 DIAGNOSIS — M25562 Pain in left knee: Secondary | ICD-10-CM | POA: Diagnosis not present

## 2022-05-10 DIAGNOSIS — M25561 Pain in right knee: Secondary | ICD-10-CM | POA: Diagnosis not present

## 2022-05-10 DIAGNOSIS — R2681 Unsteadiness on feet: Secondary | ICD-10-CM | POA: Diagnosis not present

## 2022-05-10 MED ORDER — PANCRELIPASE (LIP-PROT-AMYL) 36000-114000 UNITS PO CPEP: ORAL_CAPSULE | ORAL | 1 refills | Status: DC

## 2022-05-10 NOTE — Telephone Encounter (Signed)
-----   Message from Lin Landsman, MD sent at 05/07/2022  3:11 PM EST ----- The pathology results from colonoscopy came back normal.  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  Therefore, recommend Creon or Zenpep 1 capsule with each meal and 1 with snack   RV

## 2022-05-10 NOTE — Telephone Encounter (Signed)
Patient verbalized understanding of results. He states he will try to medication but [facility]. [facility]

## 2022-05-12 DIAGNOSIS — R2681 Unsteadiness on feet: Secondary | ICD-10-CM | POA: Diagnosis not present

## 2022-05-12 DIAGNOSIS — M25562 Pain in left knee: Secondary | ICD-10-CM | POA: Diagnosis not present

## 2022-05-12 DIAGNOSIS — M25561 Pain in right knee: Secondary | ICD-10-CM | POA: Diagnosis not present

## 2022-05-17 ENCOUNTER — Telehealth: Payer: Self-pay

## 2022-05-17 NOTE — Telephone Encounter (Addendum)
Patient left a voicemail saying he is having trouble after his colonoscopy. Patient states he is having constipation since colonoscopy. He states he has only had one bowel movement since colonoscopy. He states he is having a lot of gas and yesterday had a little hard bowel movement with a lot of gas. He is taking Miralax daily. He states he is urinating a lot since colonoscopy. He states every time he drinks something if he is laying down it will go straight through him. He is urinating every hour at night. If he is sitting up or standing it is not as bad. Please advise what you recommend

## 2022-05-18 ENCOUNTER — Other Ambulatory Visit: Payer: Self-pay | Admitting: Family

## 2022-05-18 DIAGNOSIS — Z79899 Other long term (current) drug therapy: Secondary | ICD-10-CM

## 2022-05-18 NOTE — Telephone Encounter (Signed)
Patient verbalized understanding of instructions. He states he also see Urology and will call his urologist to make  a appointment

## 2022-05-18 NOTE — Telephone Encounter (Signed)
If he is still having constipation, he can increase MiraLAX to 2 capfuls daily.  After treating constipation, if he is still having frequent urination, need to rule out UTI, please have him reach out to his PCP  RV

## 2022-05-19 DIAGNOSIS — L570 Actinic keratosis: Secondary | ICD-10-CM | POA: Diagnosis not present

## 2022-05-19 DIAGNOSIS — L308 Other specified dermatitis: Secondary | ICD-10-CM | POA: Diagnosis not present

## 2022-05-19 DIAGNOSIS — L249 Irritant contact dermatitis, unspecified cause: Secondary | ICD-10-CM | POA: Diagnosis not present

## 2022-05-19 DIAGNOSIS — D2261 Melanocytic nevi of right upper limb, including shoulder: Secondary | ICD-10-CM | POA: Diagnosis not present

## 2022-05-19 DIAGNOSIS — D2272 Melanocytic nevi of left lower limb, including hip: Secondary | ICD-10-CM | POA: Diagnosis not present

## 2022-05-19 DIAGNOSIS — X32XXXA Exposure to sunlight, initial encounter: Secondary | ICD-10-CM | POA: Diagnosis not present

## 2022-05-19 DIAGNOSIS — L821 Other seborrheic keratosis: Secondary | ICD-10-CM | POA: Diagnosis not present

## 2022-05-20 DIAGNOSIS — M25561 Pain in right knee: Secondary | ICD-10-CM | POA: Diagnosis not present

## 2022-05-20 DIAGNOSIS — R2681 Unsteadiness on feet: Secondary | ICD-10-CM | POA: Diagnosis not present

## 2022-05-20 DIAGNOSIS — M25562 Pain in left knee: Secondary | ICD-10-CM | POA: Diagnosis not present

## 2022-05-24 ENCOUNTER — Telehealth: Payer: Self-pay

## 2022-05-24 NOTE — Progress Notes (Signed)
Care Management [facility] Coordination Services Pharmacy Team  Reason for Encounter: Initial Appointment Reminder  Contacted patient to confirm in office appointment with Charlene Brooke, PharmD on 05/27/2022 at 10:00.  {US HC Outreach:[id number]}  Do you have any problems getting your medications? {yes/no:20286} If yes what types of problems are you experiencing? {Problems:27223}  What is your top health concern you would like to discuss at your upcoming visit?   Have you seen any other providers since your last visit with PCP? {yes/no:20286}   Chart review:  Recent office visits:        11/26/21 Waunita Schooner, MD HTN No med changes F/U 4-6 weeks   Recent consult visits:       12/10/21 Minus Breeding, MD (Cardiology) SOB No med changes F/U 1 year   Hospital visits:  {Hospital DC Yes/No:[id number]}   Star Rating Drugs: *** Medication:  Last Fill: Day Supply   Care Gaps: Annual wellness visit in last year? {yes/no:20286}  If Diabetic: Last eye exam / retinopathy screening: Last diabetic foot exam:  Charlene Brooke, PharmD notified  Marijean Niemann, [facility] Assistant [phone]

## 2022-05-26 NOTE — Progress Notes (Signed)
Care Management & Coordination Services Pharmacy Note  05/27/2022 Name:  Kyle Simon MRN:  [id number] DOB:  10-13-1932  Summary: Initial OV -HTN: BP is at goal in office (128/68 most recently), however he reports higher BP at home (SBP 130-150s), though not checking very often; per wife they have compared their cuff with BP readings at nephology office and readings were similar -HLD: pt is taking omega-3 (Lovaza); reviewed other medications for bleeding risk, he also takes aspirin, celecoxib, and florinef which all independently increase bleeding risk; he denies s/sx of bleeding but there is no clear indication for omega-3 and risks > benefit at this point -Constipation/Diarrhea: pt recently had colonoscopy and started Creon for pancreatic insufficiency; he reports loose/watery stools have continued; he is still taking Miralax daily, discussed normally Miralax is held for diarrhea -SIADH: pt has been out of Florinef for several weeks due to issues logging in to [facility]; reviewed purpose of Florinef   Recommendations/Changes made from today's visit: -Advised to move losartan to bedtime  -Advised to stop omega-3 -Advised pt to call GI re: Miralax use while having loose BM -Advised pt to call [facility] directly to refill Florinef  Follow up plan: -Health Concierge will call patient 3 month for BP update -Pharmacist follow up televisit scheduled for 3 months -Endocrine appt 06/30/22; Retina appt 07/06/22; podiatry appt 07/06/22; AWV 09/09/22    Subjective: Kyle Simon is an 89 y.o. year old male who is a primary patient of Eugenia Pancoast, [place].  The care coordination team was consulted for assistance with disease management and care coordination needs.    Engaged with patient face to face for initial visit. Patient lives at home with his wife and daughter. He walks using a cane. He is retired from the [employer].  Recent office visits: 04/27/22 Eugenia Pancoast, FNP OV:  Chronic  diarrhea - colonoscopy scheduled. Cbc stable, no changes.  04/09/22 Tabitha Dugal FNP, OV: HTN, skin erupton. Rx losartan 50 mg, Lotrisone cream.  04/02/22 Eliezer Lofts, MD OV:  Nasal discharge- Start: Flonase 2 sprays and Nasal Saline  01/07/22 Eugenia Pancoast, FNP OV: TOC - Ordered DEXA.  11/26/21 Waunita Schooner, MD HTN No med changes F/U 4-6 weeks   Recent consult visits: 05/07/22 Colonoscopy Results: "pathology results from colonoscopy came back normal. 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  Therefore, recommend Creon or Zenpep 1 capsule with each meal and 1 with snack"  04/07/22 Sherri Sear, MD (GI): Chronic diarrhea - avoid laxatives, start Miralax PRN. Abd lab, rec colonoscopy.  03/30/22 Boneta Lucks, DPM (Podiatry) nail debridement  03/29/22 Elayne Snare, MD (Endocrine):  Hyponatremia - fluid restriction, will not increase Florinef d/t higher BP.   03/23/22 Carlyon Shadow, PA (Ortho): S/P TKR No med changes  01/06/22 Bo Merino, MD (Rheumatology) Chronic ankle pain, DDD, osteoporosis. Consider DEXA scan. F/u PRN  12/31/21 Boneta Lucks, DPM (Podiatry) R ankle injection  12/10/21 Minus Breeding, MD (Cardiology) SOB - no acute cardiac etiology. No changes.   Hospital visits: 05/06/22 Planned admission [facility]): colonoscopy, sigmoidoscopy.   Objective:  Lab Results  Component Value Date   CREATININE 0.77 03/25/2022   BUN 17 03/25/2022   GFR 80.74 03/25/2022   GFRNONAA >60 09/09/2020   GFRAA 85 03/21/2019   NA 128 (L) 03/25/2022   K 4.5 03/25/2022   CALCIUM 9.2 03/25/2022   CO2 26 03/25/2022   GLUCOSE 170 (H) 03/25/2022    Lab Results  Component Value Date/Time  HGBA1C 6.9 (H) 03/25/2022 10:29 AM   HGBA1C 6.9 (H) 11/09/2021 01:50 PM   GFR 80.74 03/25/2022 10:29 AM   GFR 69.72 11/09/2021 01:50 PM   MICROALBUR <0.7 04/27/2022 11:32 AM   MICROALBUR 10 04/14/2017 04:00 PM   MICROALBUR 0.3 11/10/2015 09:00 AM    Last diabetic  Eye exam:  Lab Results  Component Value Date/Time   HMDIABEYEEXA Retinopathy (A) 02/15/2019 12:00 AM    Last diabetic Foot exam:  Lab Results  Component Value Date/Time   HMDIABFOOTEX See report in chart 06/10/2016 12:00 AM     Lab Results  Component Value Date   CHOL 128 11/26/2021   HDL 51.50 11/26/2021   LDLCALC 58 11/26/2021   LDLDIRECT 121 (H) 11/15/2018   TRIG 88.0 11/26/2021   CHOLHDL 2 11/26/2021       Latest Ref Rng & Units 09/06/2020    4:35 AM 12/05/2019    2:02 PM 11/20/2019    2:22 PM  Hepatic Function  Total Protein 6.5 - 8.1 g/dL 8.1  6.7  7.0   Albumin 3.5 - 5.0 g/dL 4.3  4.0  4.1   AST 15 - 41 U/L 24  18  13    ALT 0 - 44 U/L 18  15  12    Alk Phosphatase 38 - 126 U/L 75  62  73   Total Bilirubin 0.3 - 1.2 mg/dL 1.7  0.5  0.7     Lab Results  Component Value Date/Time   TSH 0.45 03/25/2022 10:29 AM   TSH 3.17 11/09/2021 01:50 PM   FREET4 1.46 03/25/2022 10:29 AM   FREET4 1.22 11/09/2021 01:50 PM       Latest Ref Rng & Units 04/27/2022   11:30 AM 08/20/2021    1:35 PM 06/17/2021    9:38 AM  CBC  WBC 4.0 - 10.5 K/uL 5.4  6.4  5.7   Hemoglobin 13.0 - 17.0 g/dL 11.7  12.0  11.6   Hematocrit 39.0 - 52.0 % 34.1  36.0  33.8   Platelets 150.0 - 400.0 K/uL 252.0  251.0  252.0    Iron/TIBC/Ferritin/ %Sat    Component Value Date/Time   IRON 100 02/05/2020 1253   FERRITIN 28.6 06/17/2021 0938   IRONPCTSAT 26.7 02/05/2020 1253    Lab Results  Component Value Date/Time   VD25OH 48.0 11/15/2018 10:04 AM   VD25OH 43.1 09/15/2016 08:12 AM   VITAMINB12 >1500 (H) 11/26/2021 10:44 AM   VITAMINB12 1,558 (H) 02/16/2019 09:14 AM    Clinical ASCVD: Yes  The ASCVD Risk score (Arnett DK, et al., 2019) failed to calculate for the following reasons:   The 2019 ASCVD risk score is only valid for ages 55 to 20       04/02/2022    4:14 PM 09/07/2021    8:54 AM 09/04/2020    8:31 AM  Depression screen PHQ 2/9  Decreased Interest 1 0 0  Down, Depressed, Hopeless 0 0  0  PHQ - 2 Score 1 0 0  Altered sleeping 0 0 0  Tired, decreased energy 2 0 0  Change in appetite 0 0 0  Feeling bad or failure about yourself  0 0 0  Trouble concentrating 0 0 0  Moving slowly or fidgety/restless 0 0 0  Suicidal thoughts 0 0 0  PHQ-9 Score 3 0 0  Difficult doing work/chores Somewhat difficult Not difficult at all Not difficult at all       04/02/2022    4:14 PM 07/04/2018  9:41 AM  GAD 7 : Generalized Anxiety Score  Nervous, Anxious, on Edge 0 0  Control/stop worrying 0 0  Worry too much - different things 0 0  Trouble relaxing 0 0  Restless 0 0  Easily annoyed or irritable 0 0  Afraid - awful might happen 0 0  Total GAD 7 Score 0 0  Anxiety Difficulty Not difficult at all Not difficult at all    Social History   Tobacco Use  Smoking Status Former   Packs/day: 1.00   Years: 18.00   Additional pack years: 0.00   Total pack years: 18.00   Types: Cigarettes   Quit date: 03/01/1966   Years since quitting: 56.2   Passive exposure: Never  Smokeless Tobacco Never   BP Readings from Last 3 Encounters:  05/06/22 128/68  04/27/22 (!) 142/72  04/09/22 (!) 188/98   Pulse Readings from Last 3 Encounters:  05/06/22 (!) 53  04/27/22 60  04/09/22 (!) 54   Wt Readings from Last 3 Encounters:  05/06/22 174 lb (78.9 kg)  04/27/22 184 lb 3.2 oz (83.6 kg)  04/09/22 187 lb (84.8 kg)   BMI Readings from Last 3 Encounters:  05/06/22 24.97 kg/m  04/27/22 26.43 kg/m  04/09/22 26.83 kg/m    Allergies  Allergen Reactions   Simvastatin Other (See Comments)    MYALGIAS   Oxycodone Other (See Comments)    Severe constipation    Medications Reviewed Today     Reviewed by Charlton Haws, [facility] (Pharmacist) on 05/27/22 at 1428  Med List Status: <None>   Medication Order Taking? Sig Documenting Provider Last Dose Status Informant  acetaminophen (TYLENOL) 500 MG tablet [id number] Yes Take 1,000 mg by mouth every 6 (six) hours as needed for mild pain. Takes 2  tablets nightly [provider] Taking Active Self           Med Note Caryn Section, [place] A   Tue Oct 18, 2017  2:44 PM)    ammonium lactate (AMLACTIN DAILY) 12 % lotion [id number] Yes Apply 1 Application topically as needed for dry skin. Felipa Furnace, DPM Taking Active   aspirin 81 MG tablet [id number] Yes Take 81 mg by mouth every evening.  [provider] Taking Active Self  Calcium Carb-Cholecalciferol (CALCIUM 500/VITAMIN D PO) [id number] Yes Take 1 tablet by mouth daily. [provider] Taking Active   celecoxib (CELEBREX) 200 MG capsule [id number] Yes Take 200 mg by mouth 2 (two) times daily. [provider] Taking Active Self  CLOBETASOL PROPIONATE E 0.05 % emollient cream [id number] Yes as needed. [provider] Taking Active   diclofenac Sodium (VOLTAREN) 1 % GEL [id number] Yes Apply 2 g topically at bedtime. [provider] Taking Active Self  Ferrous Sulfate (IRON) 28 MG TABS [id number] Yes Take 1 each by mouth daily. [provider] Taking Active   finasteride (PROSCAR) 5 MG tablet [id number] Yes Take 5 mg by mouth daily. [provider] Taking Active   fludrocortisone (FLORINEF) 0.1 MG tablet [id number] Yes 1 tablet 3 days a week Elayne Snare, MD Taking Active   Hypromellose (ARTIFICIAL TEARS OP) [id number] Yes Place 1 drop into both eyes 2 (two) times daily. [provider] Taking Active Self  lipase/protease/amylase (CREON) 36000 UNITS CPEP capsule [id number] Yes Take 1 capsules with the first bite of each meal and 1 capsule with the first bite of each snack Vanga, Tally Due, MD Taking Active   losartan (COZAAR) 50 MG tablet [id number] Yes Take 1  tablet (50 mg total) by mouth daily. Eugenia Pancoast, FNP Taking Active   Multiple Vitamin (MULTIVITAMIN WITH MINERALS) TABS [id number] Yes Take 1 tablet by mouth daily. [provider] Taking Active Self  Omega-3 Fatty Acids (FISH OIL) 1000 MG CAPS [id number] Yes Take  1,000 mg by mouth every evening. [provider] Taking Active Self  polyethylene glycol (MIRALAX) 17 g packet [id number] Yes Take 17 g by mouth daily as needed for mild constipation or moderate constipation. Eugenia Pancoast, FNP Taking Active   Probiotic Product [facility]) CAPS [id number] Yes One tab qd Mellody Dance, DO Taking Active Self  rosuvastatin (CRESTOR) 5 MG tablet [id number] Yes Take 1 tablet (5 mg total) by mouth at bedtime. Waunita Schooner, MD Taking Active   SYNTHROID 137 MCG tablet [id number] Yes TAKE 1 TABLET DAILY BEFORE Payton Spark, MD Taking Active   tamsulosin [facility]) 0.4 MG CAPS capsule [id number] Yes Take 0.4 mg by mouth 2 (two) times daily. [provider] Taking Active Self            SDOH:  (Social Determinants of Health) assessments and interventions performed: No SDOH Interventions    Flowsheet Row Clinical Support from 09/07/2021 in [facility] [place] from 09/04/2020 in [place] [facility] [place] Interventions Intervention Not Indicated --  Housing Interventions Intervention Not Indicated --  Transportation Interventions Intervention Not Indicated --  Depression Interventions/Treatment  -- PHQ2-9 Score <4 Follow-up Not Indicated  Financial Strain Interventions Intervention Not Indicated --  Physical Activity Interventions Intervention Not Indicated --  Stress Interventions Intervention Not Indicated --  Social Connections Interventions Intervention Not Indicated --       Medication Assistance: None required.  Patient affirms current coverage meets needs.  Medication Access: Within the past 30 days, how often has patient missed a dose of medication? 0 Is a pillbox or other method used to improve adherence? Yes  Factors that may affect medication adherence? no barriers identified Are meds synced by current pharmacy? No  Are meds  delivered by current pharmacy? Yes  Does patient experience delays in picking up medications due to transportation concerns? No   [facility]stream Services Reviewed: Is patient disadvantaged to use [facility]?: Yes  Current Rx insurance plan: [employer] Name and location of Current pharmacy:  [place], [place] [address] [place] [address] Phone: [phone] Fax: [phone]  [id number] [place], [place] [address] Phone: [phone] Fax: [phone]  [facility] Pharm[facility] services reviewed with patient today?: No  Patient requests to transfer care to [facility] Pharmacy?: No  Reason patient declined to change pharmacies: Disadvantaged due to insurance/mail order  Compliance/Adherence/Medication fill history: Care Gaps: Eye exam (due 03/2021)  Star-Rating Drugs: Losartan - PDC 100% Rosuvastatin - PDC 88% (LF 03/31/22 x 90 ds)   Assessment/Plan   Hypertension (BP goal <130/80) -Query controlled - BP is at goal in most recent OV, however at home pt reports SBP range 130-150s (though not checking very often) -Current treatment: Losartan 50 mg daily AM - Appropriate, Query Effective -Medications previously tried: n/a  -Denies hypotensive/hypertensive symptoms -Educated on BP goals and benefits of medications for prevention of heart attack, stroke and kidney damage; Importance of home blood pressure monitoring; -Counseled to monitor BP at home daily, document, and provide log at future appointments -Recommended to continue current  medication and move losartan to bedtime  Hyperlipidemia: (LDL goal < 70) -Controlled - LDL 58 (10/2021) at goal -Hx aortic atherosclerosis; extensive coronary calcification seen on CT chest -Current treatment: Rosuvastatin 5 mg daily - Appropriate, Effective, Safe, Accessible Aspirin 81 mg daily - Appropriate, Effective, Safe,  Accessible Fish oil 1000 mg - Appropriate, Effective, Query Safe -Medications previously tried: n/a  -Educated on Cholesterol goals;  -Reviewed overall bleeding risk - pt takes aspirin, celecoxib, florinef and fish oil which all carry increased risk for bleeding; given TRIG at goal and questionable utility of fish oil, advised to stop fish oil -Recommended to continue current medication  Diabetes (A1c goal <7%) -Diet-controlled - A1c 6.9% (03/2022) -Counseled on diet and exercise extensively  Osteopenia (Goal prevent fractures) -Controlled - hx of wrist fracture -Last DEXA Scan: 03/18/22   T-Score femoral neck: -2.3  T-Score forearm radius: -2.3  10-year probability of major osteoporotic fracture: 12.2%  10-year probability of hip fracture: 5.1% -Patient is not a candidate for pharmacologic treatment -Current treatment  Calcium-Vitamin D - Appropriate, Effective, Safe, Accessible -Medications previously tried: n/a  -Recommended to continue current medication  SIADH (Goal: maintain Na > 125) -Not ideally controlled - pt has been out of florinef for about 2 weeks, he cannot login to his [facility] account to refill the medication -Current treatment  Fludrocortisone 0.1 mg 3x week - Appropriate, Effective, Safe, Accessible -Medications previously tried: n/a -Reviewed purpose of Florinef with patient  -Recommended to continue current medication; advised pt call [facility] directly to refill medication  Chronic constipation / Pancreatic insufficiency (Goal: normal formed BM) -Not ideally controlled - -Now with Pancreatic insufficiency: on Creon -pt reports loose/watery stools since colonoscopy earlier this month; he reports he has not had solid BM since procedure; he has been taking Creon for pancreatic insufficiency for about 2 weeks and is not sure it has helped; he continues on daily Miralax as well -Current treatment  Probiotic (Restora) daily - Appropriate, Effective,  Safe, Accessible Miralax daily  - Query appropriate Creon - 1 capsule with meals and 1 capsule with snacks - Appropriate, Query Effective Linzess 72 mcg - not taking -Medications previously tried: n/a  -Reviewed role of Creon; discussed appropriateness of using Miralax while having loose BM, discussed normally Miralax would be held in this setting; advised pt to contact GI with update on his symptoms and ask them whether to continue Miralax  Anemia (Goal: HGB > 11) -Controlled - HGB 11.7 (04/2022);  -Current treatment  Ferrous sulfate 28 mg daily - Appropriate, Effective, Safe, Accessible -Medications previously tried: n/a  -Recommended to continue current medication  Pain (Goal: manage pain) -Controlled - pt reports celebrex is very helpful  -lumbar radiculopathy -Current treatment  Tylenol 500 mg PRN - Appropriate, Effective, Safe, Accessible Celecoxib 200 mg BID - Appropriate, Effective, Query Safe Voltaren gel - Appropriate, Effective, Safe, Accessible -Medications previously tried: n/a -Reviewed safety concerns with celebrex - kidney impairment, bleeding, hypertension; pt will continue to monitor BP  -Recommended to continue current medication  Health Maintenance -Hypothyroidism: on Synthroid 137 mcg -BPH: on tamsulosin BID, finasteride 5 mg. Reviewed purpose of finasteride for prostate shrinking and tamsulosin for symptom relief -OTC: multiivtamin   Charlene Brooke, PharmD, Para [place], CPP Clinical Pharmacist Practitioner [place] at [facility] [phone]

## 2022-05-27 ENCOUNTER — Ambulatory Visit: Payer: Medicare Other | Admitting: Pharmacist

## 2022-05-27 NOTE — Patient Instructions (Signed)
Visit Information  Phone number for Pharmacist: [phone]  Thank you for meeting with me to discuss your medications! I look forward to working with you to achieve your health care goals. Below is a summary of what we talked about during the visit:  Call [facility] to refill Florinef  Move Losartan to bedtime. Keep checking blood pressure daily, let us know if it is consistently above 150/90  Stop the omega-3 fish oil.  Call Dr Verlin Grills office about using Miralax while you are having loose/water stools.     Charlene Brooke, PharmD, BCACP Clinical Pharmacist [facility] [phone] Visit Information

## 2022-05-31 ENCOUNTER — Telehealth: Payer: Self-pay | Admitting: Gastroenterology

## 2022-05-31 DIAGNOSIS — K529 Noninfective gastroenteritis and colitis, unspecified: Secondary | ICD-10-CM

## 2022-05-31 DIAGNOSIS — R195 Other fecal abnormalities: Secondary | ICD-10-CM

## 2022-05-31 NOTE — Telephone Encounter (Signed)
Patient states she is having constipation but has had some diarrhea. Patient states he is taking the creon. Patient states he is taking the Miralax 1 cupful daily. He states he does not think the creon is helping his symptoms and the pharmacist think he is taking to many medications to be on the Miralax also. Patient states there is something wrong he does not know what. He does know that you will not be in the office till 04/08.

## 2022-05-31 NOTE — Telephone Encounter (Signed)
Pt left message still having gastric problems would like a call back

## 2022-06-01 DIAGNOSIS — M25562 Pain in left knee: Secondary | ICD-10-CM | POA: Diagnosis not present

## 2022-06-01 DIAGNOSIS — R2681 Unsteadiness on feet: Secondary | ICD-10-CM | POA: Diagnosis not present

## 2022-06-01 DIAGNOSIS — M25561 Pain in right knee: Secondary | ICD-10-CM | POA: Diagnosis not present

## 2022-06-04 DIAGNOSIS — M25561 Pain in right knee: Secondary | ICD-10-CM | POA: Diagnosis not present

## 2022-06-04 DIAGNOSIS — R2681 Unsteadiness on feet: Secondary | ICD-10-CM | POA: Diagnosis not present

## 2022-06-04 DIAGNOSIS — M25562 Pain in left knee: Secondary | ICD-10-CM | POA: Diagnosis not present

## 2022-06-07 DIAGNOSIS — R195 Other fecal abnormalities: Secondary | ICD-10-CM | POA: Diagnosis not present

## 2022-06-07 DIAGNOSIS — K529 Noninfective gastroenteritis and colitis, unspecified: Secondary | ICD-10-CM | POA: Diagnosis not present

## 2022-06-07 NOTE — Telephone Encounter (Signed)
Order the iron panel and GI profile.  Called patient and patient verbalized understanding of instructions. He states he will come for labs today

## 2022-06-07 NOTE — Addendum Note (Signed)
Addended by: Radene Knee L on: 06/07/2022 08:43 AM   Modules accepted: Orders

## 2022-06-07 NOTE — Telephone Encounter (Signed)
Recommend him to stop calcium and take only vitamin D supplements Stop iron and check his iron panel Stop probiotics Recheck GI profile PCR Recommend to try strict lactose-free diet for 1 month and give him the information If his symptoms are persistent, he should let us know  Laveda Demedeiros

## 2022-06-08 LAB — IRON,TIBC AND FERRITIN PANEL
Ferritin: 60 ng/mL (ref 30–400)
Iron Saturation: 33 % (ref 15–55)
Iron: 111 ug/dL (ref 38–169)
Total Iron Binding Capacity: 339 ug/dL (ref 250–450)
UIBC: 228 ug/dL (ref 111–343)

## 2022-06-09 DIAGNOSIS — M25561 Pain in right knee: Secondary | ICD-10-CM | POA: Diagnosis not present

## 2022-06-09 DIAGNOSIS — R2681 Unsteadiness on feet: Secondary | ICD-10-CM | POA: Diagnosis not present

## 2022-06-09 DIAGNOSIS — M25562 Pain in left knee: Secondary | ICD-10-CM | POA: Diagnosis not present

## 2022-06-09 LAB — GI PROFILE, STOOL, PCR

## 2022-06-14 DIAGNOSIS — R2681 Unsteadiness on feet: Secondary | ICD-10-CM | POA: Diagnosis not present

## 2022-06-14 DIAGNOSIS — M25562 Pain in left knee: Secondary | ICD-10-CM | POA: Diagnosis not present

## 2022-06-14 DIAGNOSIS — M25561 Pain in right knee: Secondary | ICD-10-CM | POA: Diagnosis not present

## 2022-06-16 DIAGNOSIS — M25561 Pain in right knee: Secondary | ICD-10-CM | POA: Diagnosis not present

## 2022-06-16 DIAGNOSIS — R2681 Unsteadiness on feet: Secondary | ICD-10-CM | POA: Diagnosis not present

## 2022-06-16 DIAGNOSIS — M25562 Pain in left knee: Secondary | ICD-10-CM | POA: Diagnosis not present

## 2022-06-17 DIAGNOSIS — N401 Enlarged prostate with lower urinary tract symptoms: Secondary | ICD-10-CM | POA: Diagnosis not present

## 2022-06-17 DIAGNOSIS — R3914 Feeling of incomplete bladder emptying: Secondary | ICD-10-CM | POA: Diagnosis not present

## 2022-06-24 ENCOUNTER — Ambulatory Visit: Payer: Medicare Other | Admitting: Podiatry

## 2022-06-25 DIAGNOSIS — R2681 Unsteadiness on feet: Secondary | ICD-10-CM | POA: Diagnosis not present

## 2022-06-25 DIAGNOSIS — M25562 Pain in left knee: Secondary | ICD-10-CM | POA: Diagnosis not present

## 2022-06-25 DIAGNOSIS — M25561 Pain in right knee: Secondary | ICD-10-CM | POA: Diagnosis not present

## 2022-06-28 ENCOUNTER — Other Ambulatory Visit (INDEPENDENT_AMBULATORY_CARE_PROVIDER_SITE_OTHER): Payer: Medicare Other

## 2022-06-28 DIAGNOSIS — E063 Autoimmune thyroiditis: Secondary | ICD-10-CM

## 2022-06-28 DIAGNOSIS — E871 Hypo-osmolality and hyponatremia: Secondary | ICD-10-CM

## 2022-06-28 LAB — BASIC METABOLIC PANEL
BUN: 14 mg/dL (ref 6–23)
CO2: 26 mEq/L (ref 19–32)
Calcium: 8.8 mg/dL (ref 8.4–10.5)
Chloride: 96 mEq/L (ref 96–112)
Creatinine, Ser: 0.87 mg/dL (ref 0.40–1.50)
GFR: 77.67 mL/min (ref >60.00)
Glucose, Bld: 107 mg/dL — ABNORMAL HIGH (ref 70–99)
Potassium: 4.4 mEq/L (ref 3.5–5.1)
Sodium: 130 mEq/L — ABNORMAL LOW (ref 135–145)

## 2022-06-28 LAB — T4, FREE: Free T4: 1.31 ng/dL (ref 0.60–1.60)

## 2022-06-28 LAB — TSH: TSH: 1.63 u[IU]/mL (ref 0.35–5.50)

## 2022-06-29 DIAGNOSIS — R2681 Unsteadiness on feet: Secondary | ICD-10-CM | POA: Diagnosis not present

## 2022-06-29 DIAGNOSIS — M25562 Pain in left knee: Secondary | ICD-10-CM | POA: Diagnosis not present

## 2022-06-29 DIAGNOSIS — M25561 Pain in right knee: Secondary | ICD-10-CM | POA: Diagnosis not present

## 2022-06-30 ENCOUNTER — Encounter: Payer: Self-pay | Admitting: Endocrinology

## 2022-06-30 ENCOUNTER — Ambulatory Visit (INDEPENDENT_AMBULATORY_CARE_PROVIDER_SITE_OTHER): Payer: Medicare Other | Admitting: Endocrinology

## 2022-06-30 VITALS — BP 148/80 | HR 47 | Ht 70.0 in | Wt 186.0 lb

## 2022-06-30 DIAGNOSIS — E063 Autoimmune thyroiditis: Secondary | ICD-10-CM | POA: Diagnosis not present

## 2022-06-30 DIAGNOSIS — E871 Hypo-osmolality and hyponatremia: Secondary | ICD-10-CM

## 2022-06-30 DIAGNOSIS — E119 Type 2 diabetes mellitus without complications: Secondary | ICD-10-CM

## 2022-06-30 NOTE — Patient Instructions (Addendum)
Florinef 3 days a week  Check with Dr Lenore Manner locally

## 2022-06-30 NOTE — Progress Notes (Signed)
Patient ID: Kyle Simon, male   DOB: [birth year], 89 y.o.   MRN: [id number]            Reason for Appointment: Endocrinology follow-up visit    History of Present Illness:   HYPONATREMIA:  Background history:  Serum sodium has been low since at least 2011 and has been as low as 125 in the past More recently lowest reading 127 He has not had any associated symptoms of decreased appetite, nausea or drowsiness Not on any thiazide diuretics or SSRI drugs Has had negative endocrine work-up for cortisol deficiencies  Initially sodium was significantly better after starting DEMECLOCYCLINE 150 mg daily in 9/21 However this was stopped when his sodium decreased on the treatment down to 126  Recent history:  Has a history of drinking a lot of water partly for his constipation  His urine osmolality was last high at 247 Urine osmolality has been as high as 551 previously Urine sodium was 130 previously  He has been advised cut back on his fluid intake especially coffee He is trying to do this better now  With a trial of salt tablets his sodium did not improve, it was still down to 126 in 9/21 Also was having nausea from taking more than 1 regular tablet daily  He was empirically started on FLORINEF in April 2023 because of persistently low sodium, mild increase in potassium and history of relatively high urine sodium  Also had a potassium of 5.3 at baseline He is now taking this 3 times a week  Sodium is now 130 compared to 128 previously Usually with low sodium he has no change in appetite, nausea, weakness or somnolence  No swelling of the ankles with Florinef Although he was told to try 4 tablets weekly he says he periodically will run out of his prescription because of inadequate tablets and his refills but has taken it recently  CT scan of chest did not show any tumor  Lab Results  Component Value Date   NA 130 (L) 06/28/2022   K 4.4 06/28/2022   CL 96 06/28/2022    CO2 26 06/28/2022     Hypothyroidism was first diagnosed  around 2005 years ago   At the time of diagnosis patient was not having symptoms of  fatigue, cold sensitivity, difficulty concentrating, dry skin, weight gain Likely had only done routine testing and was told that he was hypothyroid and started him on unknown dose of levothyroxine  He did not start feeling any different when he started taking levothyroxine initially Also usually does not feel any different with dosage adjustments that have been made Apparently his dose was 150 g for some time but this was reduced in 06/2015 when his TSH was low normal Since then he has been taking 150 g 5 days a week and 125 g twice a week on-insulin initial consultation he was complaining that since spring time his energy level has been not as good and he has not been able to do as much with activities as he used to   He was referred here because his TSH is tending to be high but also his free T4 was relatively higher in 7/18  Recent history: On his initial consultation since his TSH was still relatively high and he was taking the equivalent of about 142 g of levothyroxine daily he was switched to taking 150 g once daily  He is taking his levothyroxine regularly before breakfast every morning The dose has been gradually  decreased the last 2 times  He is now on 137 mcg levothyroxine daily although he was told to take 6-1/2 tablets weekly on his last visit but he forgot No unusual fatigue TSH is now 1.6 compared to 0.45 previously   Patient's weight history is as follows:  Wt Readings from Last 3 Encounters:  06/30/22 186 lb (84.4 kg)  05/06/22 174 lb (78.9 kg)  04/27/22 184 lb 3.2 oz (83.6 kg)    Thyroid function results have been as follows:  Lab Results  Component Value Date   TSH 1.63 06/28/2022   TSH 0.45 03/25/2022   TSH 3.17 11/09/2021   TSH 0.61 08/20/2021   FREET4 1.31 06/28/2022   FREET4 1.46 03/25/2022   FREET4 1.22  11/09/2021   FREET4 1.60 08/20/2021   T3FREE 3.2 12/28/2016   T3FREE 2.7 10/17/2015    Lab Results  Component Value Date   TSH 1.63 06/28/2022   TSH 0.45 03/25/2022   TSH 3.17 11/09/2021   TSH 0.61 08/20/2021   TSH 0.44 06/15/2021   TSH 0.10 (L) 04/21/2021   TSH 0.05 (L) 02/16/2021   TSH 1.02 10/13/2020   TSH 0.55 06/30/2020     Past Medical History:  Diagnosis Date   Arthritis    both hands;Dr.Deveshwar   Constipation due to pain medication    Diabetes mellitus without complication (HCC)    Type II. Uses no medication. Pt controls with diet and weight   Enlarged prostate    Frequent urination at night    GERD (gastroesophageal reflux disease)    History of noncompliance with medical treatment, presenting hazards to health 11/20/2018   Hyperlipidemia associated with type 2 diabetes mellitus (HCC) 04/19/2007   Qualifier: Diagnosis of  By: Alwyn Ren MD, William     Hypertension    Hypothyroidism    Polio    as a child w/o complications   Snoring    no sleep apnea   Statin declined-  11/20/2018   patient understands risks associated with this decision and has been advised to restart by cardiologist as well as myself several times   Torn meniscus    Transfusion history 2012   post TKR    Past Surgical History:  Procedure Laterality Date   ANTERIOR LAT LUMBAR FUSION Right 11/03/2017   Procedure: Right Lumbar three-four Anterolateral lumbar interbody fusion with lateral plate;  Surgeon: Maeola Harman, MD;  Location: [facility] OR;  Service: Neurosurgery;  Laterality: Right;   BACK SURGERY  2011   hemiarthroplasty 01/11/1999 and 6   CARDIAC CATHETERIZATION  09/2001   negative   COLONOSCOPY     COLONOSCOPY N/A 05/06/2022   Procedure: COLONOSCOPY;  Surgeon: Toney Reil, MD;  Location: [facility] ENDOSCOPY;  Service: Gastroenterology;  Laterality: N/A;   COLONOSCOPY WITH PROPOFOL N/A 05/05/2022   Procedure: COLONOSCOPY WITH PROPOFOL;  Surgeon: Toney Reil, MD;  Location: [facility]  ENDOSCOPY;  Service: Gastroenterology;  Laterality: N/A;   EYE SURGERY  2003   R&L cataract removed & IOL- 2003 & 2007,Dr.Jenkins   INGUINAL HERNIA REPAIR  2002   right, Dr. Luan Pulling   JOINT REPLACEMENT  2012   left knee replacement   LIPOMA EXCISION     back   MENISCECTOMY Bilateral    Dr. Thurston Hole   PROSTATE SURGERY  07/2006   for urinary urgency, Dr. Tiana Loft CUFF REPAIR Right October, 2016   sinus & throat surgery-1995  1995   TONSILLECTOMY     TOTAL KNEE ARTHROPLASTY  01/11/2011  Procedure: TOTAL KNEE ARTHROPLASTY;  Surgeon: Raymon Mutton, MD;  Location: [facility] OR;  Service: Orthopedics;  Laterality: Left;   TOTAL KNEE ARTHROPLASTY Right 11/22/2016   Procedure: TOTAL KNEE ARTHROPLASTY;  Surgeon: Dannielle Huh, MD;  Location: [facility] OR;  Service: Orthopedics;  Laterality: Right;   UVULECTOMY  1996   Dr. Lazarus Salines   VASECTOMY     WRIST SURGERY  1959   fracture- right   XI ROBOTIC ASSISTED VENTRAL HERNIA N/A 09/07/2020   Procedure: XI ROBOTIC ASSISTED DIAGNOSTIC LAPAROSCOPY;  Surgeon: Leafy Ro, MD;  Location: [facility] ORS;  Service: General;  Laterality: N/A;    Family History  Problem Relation Age of Onset   Coronary artery disease Mother    Emphysema Mother    Diverticulosis Mother    Thyroid disease Mother    Sudden death Father        accident   Asthma Brother    Bone cancer Brother    Thyroid disease Daughter    Sarcoidosis Son    Diabetes Paternal Grandmother    Anesthesia problems Neg Hx    Hypotension Neg Hx    Malignant hyperthermia Neg Hx    Pseudochol deficiency Neg Hx    Colon cancer Neg Hx     Social History:  reports that he quit smoking about 56 years ago. His smoking use included cigarettes. He has a 18.00 pack-year smoking history. He has never been exposed to tobacco smoke. He has never used smokeless tobacco. He reports current alcohol use of about 4.0 standard drinks of alcohol per week. He reports that he does not use drugs.  Allergies:   Allergies  Allergen Reactions   Simvastatin Other (See Comments)    MYALGIAS   Oxycodone Other (See Comments)    Severe constipation    Allergies as of 06/30/2022       Reactions   Simvastatin Other (See Comments)   MYALGIAS   Oxycodone Other (See Comments)   Severe constipation        Medication List        Accurate as of Jun 30, 2022  3:59 PM. If you have any questions, ask your nurse or doctor.          acetaminophen 500 MG tablet Commonly known as: TYLENOL Take 1,000 mg by mouth every 6 (six) hours as needed for mild pain. Takes 2 tablets nightly   ammonium lactate 12 % lotion Commonly known as: Amlactin Daily Apply 1 Application topically as needed for dry skin.   ARTIFICIAL TEARS OP Place 1 drop into both eyes 2 (two) times daily.   aspirin 81 MG tablet Take 81 mg by mouth every evening.   CALCIUM 500/VITAMIN D PO Take 1 tablet by mouth daily.   celecoxib 200 MG capsule Commonly known as: CELEBREX Take 200 mg by mouth 2 (two) times daily.   Clobetasol Propionate E 0.05 % emollient cream Generic drug: Clobetasol Prop Emollient Base as needed.   diclofenac Sodium 1 % Gel Commonly known as: VOLTAREN Apply 2 g topically at bedtime.   finasteride 5 MG tablet Commonly known as: PROSCAR Take 5 mg by mouth daily.   Fish Oil 1000 MG Caps Take 1,000 mg by mouth every evening.   fludrocortisone 0.1 MG tablet Commonly known as: FLORINEF 1 tablet 3 days a week   Iron 28 MG Tabs Take 1 each by mouth daily.   lipase/protease/amylase 40981 UNITS Cpep capsule Commonly known as: Creon Take 1 capsules with the first bite of  each meal and 1 capsule with the first bite of each snack   losartan 50 MG tablet Commonly known as: COZAAR Take 1 tablet (50 mg total) by mouth daily.   mometasone 0.1 % ointment Commonly known as: ELOCON Apply topically daily.   multivitamin with minerals Tabs tablet Take 1 tablet by mouth daily.   polyethylene glycol 17 g  packet Commonly known as: MiraLax Take 17 g by mouth daily as needed for mild constipation or moderate constipation.   Restora Caps One tab qd   rosuvastatin 5 MG tablet Commonly known as: Crestor Take 1 tablet (5 mg total) by mouth at bedtime.   Synthroid 137 MCG tablet Generic drug: levothyroxine TAKE 1 TABLET DAILY BEFORE BREAKFAST   tamsulosin 0.4 MG Caps capsule Commonly known as: FLOMAX Take 0.4 mg by mouth 2 (two) times daily.         Review of Systems   Previously on losartan 50 mg for reportedly kidney protection by PCP Losartan was stopped because of low normal blood pressure readings previously   He has a blood pressure monitor at home  Recent home BP: Usually 130-140 systolic, 65 diastolic  Cortisol level has been normal  BP Readings from Last 3 Encounters:  06/30/22 (!) 148/80  05/06/22 128/68  04/27/22 (!) 142/72     He has history of mild diabetes with A1c as high as 7.1.  Also several years ago he did have blood sugars in the diabetic range  His last A1c 6.9 again compared to 6.3 in 4/23  He is using One Touch Verio monitor  Checking blood sugars in the mornings and these are close to 100 but does not remember many readings, last 103 His blood sugar was 107 in the lab He is eating 2 donuts in the morning almost daily which he says has been his habit for last 20 years  Has not been treated with oral hypoglycemic drugs  Has limited ability to exercise because of knee pain Weight has fluctuated  Wt Readings from Last 3 Encounters:  06/30/22 186 lb (84.4 kg)  05/06/22 174 lb (78.9 kg)  04/27/22 184 lb 3.2 oz (83.6 kg)   Lab Results  Component Value Date   HGBA1C 6.9 (H) 03/25/2022   HGBA1C 6.9 (H) 11/09/2021   HGBA1C 6.3 (A) 06/17/2021   Lab Results  Component Value Date   MICROALBUR <0.7 04/27/2022   LDLCALC 58 11/26/2021   CREATININE 0.87 06/28/2022            Examination:    BP (!) 148/80 (Patient Position: Standing)    Pulse (!) 47   Ht 5\' 10"  (1.778 m)   Wt 186 lb (84.4 kg)   SpO2 98%   BMI 26.69 kg/m   No ankle edema present  Assessment:    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 since he has had a long history of hyponatremia and CT scan did not show tumor He is usually asymptomatic  Sodium levels have been as low as 125 He is now on a regimen of Florinef 3 days a week Sodium is slightly lower at 128 Potassium normal  HYPOTHYROIDISM: Subjectively doing well TSH low normal with 137 mcg levothyroxine  Mild hypertension: Blood pressure is relatively high but not as high at home Now taking losartan  PLAN:   Continue fluid restriction with water and coffee  He can continue losartan for his blood pressure Take only Florinef 3 days a week instead of 4 to avoid further increase  in blood pressure  May consider stopping Florinef if blood pressure continues to be relatively high  He will take his Synthroid 137 mcg daily as before  He will periodically monitor his blood sugars at home including after meals and notify us if unusually high   Patient Instructions  Florinef 3 days a week  Check with Dr Lenore Manner locally  Reather Littler 06/30/2022, 3:59 PM      Note: This office note was prepared with Dragon voice recognition system technology. Any transcriptional errors that result from this process are unintentional.

## 2022-07-01 ENCOUNTER — Ambulatory Visit (INDEPENDENT_AMBULATORY_CARE_PROVIDER_SITE_OTHER): Payer: Medicare Other | Admitting: Family

## 2022-07-01 ENCOUNTER — Encounter: Payer: Self-pay | Admitting: Family

## 2022-07-01 VITALS — BP 186/77 | HR 50 | Temp 98.1°F | Ht 70.0 in | Wt 185.6 lb

## 2022-07-01 DIAGNOSIS — I152 Hypertension secondary to endocrine disorders: Secondary | ICD-10-CM

## 2022-07-01 DIAGNOSIS — E1159 Type 2 diabetes mellitus with other circulatory complications: Secondary | ICD-10-CM

## 2022-07-01 MED ORDER — LOSARTAN POTASSIUM 100 MG PO TABS: 100.0000 mg | ORAL_TABLET | Freq: Every day | ORAL | 0 refills | Status: DC

## 2022-07-01 NOTE — Patient Instructions (Signed)
  Start monitoring your blood pressure daily, around the same time of day, for the next 2-3 weeks.  Ensure that you have rested for 30 minutes prior to checking your blood pressure. Record your readings and bring them to your next visit.   Regards,   Delvecchio Madole FNP-C  

## 2022-07-01 NOTE — Progress Notes (Signed)
Established Patient Office Visit  Subjective:      CC:  Chief Complaint  Patient presents with   Hypertension    HPI: Kyle Simon is a 89 y.o. male presenting on 07/01/2022 for Hypertension . HTN: visit with endocrinologist and his blood pressure 148/80  This am he checked it at home and it 180/70 or so. He did state after checking it was back to 140/80. No cp palp and or sob. Is taking losartan 50 mg once daily.   Last visit with mumbling speech, but has not had a repeat occurrence since 2/24.       Social history:  Relevant past medical, surgical, family and social history reviewed and updated as indicated. Interim medical history since our last visit reviewed.  Allergies and medications reviewed and updated.  DATA REVIEWED: CHART IN EPIC     ROS: Negative unless specifically indicated above in HPI.    Current Outpatient Medications:    acetaminophen (TYLENOL) 500 MG tablet, Take 1,000 mg by mouth every 6 (six) hours as needed for mild pain. Takes 2 tablets nightly, Disp: , Rfl:    ammonium lactate (AMLACTIN DAILY) 12 % lotion, Apply 1 Application topically as needed for dry skin., Disp: 400 g, Rfl: 0   aspirin 81 MG tablet, Take 81 mg by mouth every evening. , Disp: , Rfl:    celecoxib (CELEBREX) 200 MG capsule, Take 200 mg by mouth 2 (two) times daily., Disp: , Rfl:    CLOBETASOL PROPIONATE E 0.05 % emollient cream, as needed., Disp: , Rfl:    diclofenac Sodium (VOLTAREN) 1 % GEL, Apply 2 g topically at bedtime., Disp: , Rfl:    finasteride (PROSCAR) 5 MG tablet, Take 5 mg by mouth daily., Disp: , Rfl:    fludrocortisone (FLORINEF) 0.1 MG tablet, 1 tablet 3 days a week, Disp: 40 tablet, Rfl: 3   Hypromellose (ARTIFICIAL TEARS OP), Place 1 drop into both eyes 2 (two) times daily., Disp: , Rfl:    lipase/protease/amylase (CREON) 36000 UNITS CPEP capsule, Take 1 capsules with the first bite of each meal and 1 capsule with the first bite of each snack, Disp: 450  capsule, Rfl: 1   losartan (COZAAR) 100 MG tablet, Take 1 tablet (100 mg total) by mouth daily., Disp: 90 tablet, Rfl: 0   mometasone (ELOCON) 0.1 % ointment, Apply topically daily., Disp: , Rfl:    Omega-3 Fatty Acids (FISH OIL) 1000 MG CAPS, Take 1,000 mg by mouth every evening., Disp: , Rfl:    polyethylene glycol (MIRALAX) 17 g packet, Take 17 g by mouth daily as needed for mild constipation or moderate constipation., Disp: 14 each, Rfl: 0   rosuvastatin (CRESTOR) 5 MG tablet, Take 1 tablet (5 mg total) by mouth at bedtime., Disp: 90 tablet, Rfl: 3   SYNTHROID 137 MCG tablet, TAKE 1 TABLET DAILY BEFORE BREAKFAST, Disp: 90 tablet, Rfl: 3   tamsulosin (FLOMAX) 0.4 MG CAPS capsule, Take 0.4 mg by mouth 2 (two) times daily., Disp: , Rfl:       Objective:    BP (!) 186/77 (BP Location: Left Arm) Comment: Home BP machine- in office  Pulse (!) 50   Temp 98.1 F (36.7 C) (Temporal)   Ht 5\' 10"  (1.778 m)   Wt 185 lb 9.6 oz (84.2 kg)   SpO2 99%   BMI 26.63 kg/m   Wt Readings from Last 3 Encounters:  07/01/22 185 lb 9.6 oz (84.2 kg)  06/30/22 186 lb (84.4 kg)  05/06/22 174 lb (78.9 kg)    Physical Exam Constitutional:      General: He is not in acute distress.    Appearance: Normal appearance. He is normal weight. He is not ill-appearing, toxic-appearing or diaphoretic.  Cardiovascular:     Rate and Rhythm: Normal rate and regular rhythm.     Heart sounds:     Gallop present.  Pulmonary:     Effort: Pulmonary effort is normal.  Musculoskeletal:        General: Normal range of motion.     Right lower leg: No edema.     Left lower leg: 2+ Edema present.  Neurological:     General: No focal deficit present.     Mental Status: He is alert and oriented to person, place, and time. Mental status is at baseline.  Psychiatric:        Mood and Affect: Mood normal.        Behavior: Behavior normal.        Thought Content: Thought content normal.        Judgment: Judgment normal.            Assessment & Plan:  Hypertension associated with diabetes (HCC) Assessment & Plan: Worsened  Increase losartan 100 mg once daily  Monitor blood pressure closely f/u one week  Monitor for any s/s stroke if any questionable call 911.   Orders: -     Losartan Potassium; Take 1 tablet (100 mg total) by mouth daily.  Dispense: 90 tablet; Refill: 0     Return in about 1 week (around 07/08/2022).  Mort Sawyers, MSN, APRN, FNP-C [facility]

## 2022-07-01 NOTE — Assessment & Plan Note (Signed)
Worsened  Increase losartan 100 mg once daily  Monitor blood pressure closely f/u one week  Monitor for any s/s stroke if any questionable call 911.

## 2022-07-02 DIAGNOSIS — R2681 Unsteadiness on feet: Secondary | ICD-10-CM | POA: Diagnosis not present

## 2022-07-02 DIAGNOSIS — M25561 Pain in right knee: Secondary | ICD-10-CM | POA: Diagnosis not present

## 2022-07-02 DIAGNOSIS — M25562 Pain in left knee: Secondary | ICD-10-CM | POA: Diagnosis not present

## 2022-07-06 ENCOUNTER — Encounter (INDEPENDENT_AMBULATORY_CARE_PROVIDER_SITE_OTHER): Payer: Medicare Other | Admitting: Ophthalmology

## 2022-07-06 ENCOUNTER — Ambulatory Visit (INDEPENDENT_AMBULATORY_CARE_PROVIDER_SITE_OTHER): Payer: Medicare Other | Admitting: Podiatry

## 2022-07-06 DIAGNOSIS — M7751 Other enthesopathy of right foot: Secondary | ICD-10-CM

## 2022-07-06 DIAGNOSIS — H43813 Vitreous degeneration, bilateral: Secondary | ICD-10-CM | POA: Diagnosis not present

## 2022-07-06 DIAGNOSIS — I10 Essential (primary) hypertension: Secondary | ICD-10-CM

## 2022-07-06 DIAGNOSIS — H33301 Unspecified retinal break, right eye: Secondary | ICD-10-CM

## 2022-07-06 DIAGNOSIS — Z7984 Long term (current) use of oral hypoglycemic drugs: Secondary | ICD-10-CM

## 2022-07-06 DIAGNOSIS — H35033 Hypertensive retinopathy, bilateral: Secondary | ICD-10-CM

## 2022-07-06 DIAGNOSIS — E113391 Type 2 diabetes mellitus with moderate nonproliferative diabetic retinopathy without macular edema, right eye: Secondary | ICD-10-CM

## 2022-07-06 DIAGNOSIS — E113312 Type 2 diabetes mellitus with moderate nonproliferative diabetic retinopathy with macular edema, left eye: Secondary | ICD-10-CM | POA: Diagnosis not present

## 2022-07-06 NOTE — Progress Notes (Signed)
Subjective:  Patient ID: Kyle Simon, male    DOB: April 04, 1932,  MRN: [id number]  Chief Complaint  Patient presents with   Nail Problem    Nail trim     89 y.o. male presents with the above complaint.  Patient presents with complaint of right ankle pain.  Patient is that there is some swelling and discomfort.  Patient states he has a history of rheumatoid arthritis he wanted get it evaluated like an injection he is also diabetic but controls with diet and weight.  Denies any other acute issues.   Review of Systems: Negative except as noted in the HPI. Denies N/V/F/Ch.  Past Medical History:  Diagnosis Date   Arthritis    both hands;Dr.Deveshwar   Constipation due to pain medication    Diabetes mellitus without complication (HCC)    Type II. Uses no medication. Pt controls with diet and weight   Enlarged prostate    Frequent urination at night    GERD (gastroesophageal reflux disease)    History of noncompliance with medical treatment, presenting hazards to health 11/20/2018   Hyperlipidemia associated with type 2 diabetes mellitus (HCC) 04/19/2007   Qualifier: Diagnosis of  By: Alwyn Ren MD, Chrissie Noa     Hypertension    Hypothyroidism    Polio    as a child w/o complications   Snoring    no sleep apnea   Statin declined-  11/20/2018   patient understands risks associated with this decision and has been advised to restart by cardiologist as well as myself several times   Torn meniscus    Transfusion history 2012   post TKR    Current Outpatient Medications:    acetaminophen (TYLENOL) 500 MG tablet, Take 1,000 mg by mouth every 6 (six) hours as needed for mild pain. Takes 2 tablets nightly, Disp: , Rfl:    ammonium lactate (AMLACTIN DAILY) 12 % lotion, Apply 1 Application topically as needed for dry skin., Disp: 400 g, Rfl: 0   aspirin 81 MG tablet, Take 81 mg by mouth every evening. , Disp: , Rfl:    celecoxib (CELEBREX) 200 MG capsule, Take 200 mg by mouth 2 (two) times  daily., Disp: , Rfl:    CLOBETASOL PROPIONATE E 0.05 % emollient cream, as needed., Disp: , Rfl:    diclofenac Sodium (VOLTAREN) 1 % GEL, Apply 2 g topically at bedtime., Disp: , Rfl:    finasteride (PROSCAR) 5 MG tablet, Take 5 mg by mouth daily., Disp: , Rfl:    fludrocortisone (FLORINEF) 0.1 MG tablet, 1 tablet 3 days a week, Disp: 40 tablet, Rfl: 3   Hypromellose (ARTIFICIAL TEARS OP), Place 1 drop into both eyes 2 (two) times daily., Disp: , Rfl:    lipase/protease/amylase (CREON) 36000 UNITS CPEP capsule, Take 1 capsules with the first bite of each meal and 1 capsule with the first bite of each snack, Disp: 450 capsule, Rfl: 1   losartan (COZAAR) 100 MG tablet, Take 1 tablet (100 mg total) by mouth daily., Disp: 90 tablet, Rfl: 0   mometasone (ELOCON) 0.1 % ointment, Apply topically daily., Disp: , Rfl:    Omega-3 Fatty Acids (FISH OIL) 1000 MG CAPS, Take 1,000 mg by mouth every evening., Disp: , Rfl:    polyethylene glycol (MIRALAX) 17 g packet, Take 17 g by mouth daily as needed for mild constipation or moderate constipation., Disp: 14 each, Rfl: 0   rosuvastatin (CRESTOR) 5 MG tablet, Take 1 tablet (5 mg total) by mouth at bedtime.,  Disp: 90 tablet, Rfl: 3   SYNTHROID 137 MCG tablet, TAKE 1 TABLET DAILY BEFORE BREAKFAST, Disp: 90 tablet, Rfl: 3   tamsulosin (FLOMAX) 0.4 MG CAPS capsule, Take 0.4 mg by mouth 2 (two) times daily., Disp: , Rfl:   Social History   Tobacco Use  Smoking Status Former   Packs/day: 1.00   Years: 18.00   Additional pack years: 0.00   Total pack years: 18.00   Types: Cigarettes   Quit date: 03/01/1966   Years since quitting: 56.3   Passive exposure: Never  Smokeless Tobacco Never    Allergies  Allergen Reactions   Simvastatin Other (See Comments)    MYALGIAS   Oxycodone Other (See Comments)    Severe constipation   Objective:  There were no vitals filed for this visit. There is no height or weight on file to calculate BMI. Constitutional Well  developed. Well nourished.  Vascular Dorsalis pedis pulses palpable bilaterally. Posterior tibial pulses palpable bilaterally. Capillary refill normal to all digits.  No cyanosis or clubbing noted. Pedal hair growth normal.  Neurologic Normal speech. Oriented to person, place, and time. Epicritic sensation to light touch grossly present bilaterally.  Dermatologic Nails well groomed and normal in appearance. No open wounds. No skin lesions.  Orthopedic: Pain on palpation of right ankle joint pain with range of motion of the joint deep intra-articular ankle pain noted mild crepitus clinically appreciated.  No pain at the ATFL ligament, Achilles tendon, peroneal tendon   Radiographs: None Assessment:   No diagnosis found.  Plan:  Patient was evaluated and treated and all questions answered.  Right ankle capsulitis with underlying history of rheumatoid arthritis -All questions and concerns were discussed with the patient in extensive detail.  Given the amount of pain that he is having would benefit from a steroid injection of decrease acute inflammatory component associate with pain.  Patient agrees with plan like to proceed with steroid injection -A steroid injection was performed at Right ankle joint using 1% plain Lidocaine and 10 mg of Kenalog. This was well tolerated.   No follow-ups on file.

## 2022-07-07 DIAGNOSIS — M25561 Pain in right knee: Secondary | ICD-10-CM | POA: Diagnosis not present

## 2022-07-07 DIAGNOSIS — R2681 Unsteadiness on feet: Secondary | ICD-10-CM | POA: Diagnosis not present

## 2022-07-07 DIAGNOSIS — M25562 Pain in left knee: Secondary | ICD-10-CM | POA: Diagnosis not present

## 2022-07-08 ENCOUNTER — Encounter: Payer: Self-pay | Admitting: Family

## 2022-07-08 ENCOUNTER — Ambulatory Visit (INDEPENDENT_AMBULATORY_CARE_PROVIDER_SITE_OTHER): Payer: Medicare Other | Admitting: Family

## 2022-07-08 VITALS — BP 172/82 | HR 80 | Temp 97.9°F | Ht 70.0 in | Wt 184.0 lb

## 2022-07-08 DIAGNOSIS — N400 Enlarged prostate without lower urinary tract symptoms: Secondary | ICD-10-CM | POA: Diagnosis not present

## 2022-07-08 DIAGNOSIS — E1159 Type 2 diabetes mellitus with other circulatory complications: Secondary | ICD-10-CM

## 2022-07-08 DIAGNOSIS — I152 Hypertension secondary to endocrine disorders: Secondary | ICD-10-CM | POA: Diagnosis not present

## 2022-07-08 DIAGNOSIS — G8929 Other chronic pain: Secondary | ICD-10-CM | POA: Insufficient documentation

## 2022-07-08 DIAGNOSIS — M79604 Pain in right leg: Secondary | ICD-10-CM | POA: Diagnosis not present

## 2022-07-08 DIAGNOSIS — M79661 Pain in right lower leg: Secondary | ICD-10-CM | POA: Diagnosis not present

## 2022-07-08 MED ORDER — TAMSULOSIN HCL 0.4 MG PO CAPS: 0.4000 mg | ORAL_CAPSULE | Freq: Two times a day (BID) | ORAL | 3 refills | Status: DC

## 2022-07-08 MED ORDER — VALSARTAN 320 MG PO TABS: 320.0000 mg | ORAL_TABLET | Freq: Every day | ORAL | 3 refills | Status: DC

## 2022-07-08 NOTE — Assessment & Plan Note (Signed)
Still not at goal Will stop losartan start valsartan 320 mg once daily  Advised to monitor blood pressure daily and f/u with me via mychart in about ten days with log  Will adjust if needed.  Watch sodium intake.

## 2022-07-08 NOTE — Progress Notes (Signed)
Established Patient Office Visit  Subjective:      CC:  Chief Complaint  Patient presents with   Medical Management of Chronic Issues    HPI: Kyle Simon is a 89 y.o. male presenting on 07/08/2022 for Medical Management of Chronic Issues . HTN: some improvement with blood pressure.  Average at home 150/70 on average.  On losartan 100 mg once daily  Today in office 170/82  Does c/o right calf pain, ongoing for the last few years.  Did see podiatry for this back in fall 2023, u/s negative for DVT. Some rest pain. Does have varicose veins. Does not wear compression stockings. No pain with walking. No known injury or trauma that he knows of.   Social history:  Relevant past medical, surgical, family and social history reviewed and updated as indicated. Interim medical history since our last visit reviewed.  Allergies and medications reviewed and updated.  DATA REVIEWED: CHART IN EPIC     ROS: Negative unless specifically indicated above in HPI.    Current Outpatient Medications:    acetaminophen (TYLENOL) 500 MG tablet, Take 1,000 mg by mouth every 6 (six) hours as needed for mild pain. Takes 2 tablets nightly, Disp: , Rfl:    ammonium lactate (AMLACTIN DAILY) 12 % lotion, Apply 1 Application topically as needed for dry skin., Disp: 400 g, Rfl: 0   aspirin 81 MG tablet, Take 81 mg by mouth every evening. , Disp: , Rfl:    celecoxib (CELEBREX) 200 MG capsule, Take 200 mg by mouth 2 (two) times daily., Disp: , Rfl:    CLOBETASOL PROPIONATE E 0.05 % emollient cream, as needed., Disp: , Rfl:    diclofenac Sodium (VOLTAREN) 1 % GEL, Apply 2 g topically at bedtime., Disp: , Rfl:    finasteride (PROSCAR) 5 MG tablet, Take 5 mg by mouth daily., Disp: , Rfl:    fludrocortisone (FLORINEF) 0.1 MG tablet, 1 tablet 3 days a week, Disp: 40 tablet, Rfl: 3   Hypromellose (ARTIFICIAL TEARS OP), Place 1 drop into both eyes 2 (two) times daily., Disp: , Rfl:    lipase/protease/amylase  (CREON) 36000 UNITS CPEP capsule, Take 1 capsules with the first bite of each meal and 1 capsule with the first bite of each snack, Disp: 450 capsule, Rfl: 1   mometasone (ELOCON) 0.1 % ointment, Apply topically daily., Disp: , Rfl:    Omega-3 Fatty Acids (FISH OIL) 1000 MG CAPS, Take 1,000 mg by mouth every evening., Disp: , Rfl:    polyethylene glycol (MIRALAX) 17 g packet, Take 17 g by mouth daily as needed for mild constipation or moderate constipation., Disp: 14 each, Rfl: 0   rosuvastatin (CRESTOR) 5 MG tablet, Take 1 tablet (5 mg total) by mouth at bedtime., Disp: 90 tablet, Rfl: 3   SYNTHROID 137 MCG tablet, TAKE 1 TABLET DAILY BEFORE BREAKFAST, Disp: 90 tablet, Rfl: 3   valsartan (DIOVAN) 320 MG tablet, Take 1 tablet (320 mg total) by mouth daily., Disp: 90 tablet, Rfl: 3   tamsulosin (FLOMAX) 0.4 MG CAPS capsule, Take 1 capsule (0.4 mg total) by mouth 2 (two) times daily., Disp: 90 capsule, Rfl: 3      Objective:    BP (!) 172/82   Pulse 80   Temp 97.9 F (36.6 C) (Temporal)   Ht 5\' 10"  (1.778 m)   Wt 184 lb (83.5 kg)   SpO2 99%   BMI 26.40 kg/m   Wt Readings from Last 3 Encounters:  07/08/22 184  lb (83.5 kg)  07/01/22 185 lb 9.6 oz (84.2 kg)  06/30/22 186 lb (84.4 kg)    Physical Exam Constitutional:      General: He is not in acute distress.    Appearance: Normal appearance. He is normal weight. He is not ill-appearing, toxic-appearing or diaphoretic.  Cardiovascular:     Rate and Rhythm: Normal rate and regular rhythm.     Pulses:          Dorsalis pedis pulses are 2+ on the right side and 2+ on the left side.       Posterior tibial pulses are 2+ on the right side and 2+ on the left side.  Pulmonary:     Effort: Pulmonary effort is normal.     Breath sounds: Normal breath sounds.  Musculoskeletal:        General: Normal range of motion.     Right lower leg: Tenderness (right calf with palpable tenderness, no bulge) present. No edema.     Left lower leg: No  edema.  Skin:    Findings: No rash.  Neurological:     General: No focal deficit present.     Mental Status: He is alert and oriented to person, place, and time. Mental status is at baseline.  Psychiatric:        Mood and Affect: Mood normal.        Behavior: Behavior normal.        Thought Content: Thought content normal.        Judgment: Judgment normal.            Assessment & Plan:  Benign prostatic hyperplasia without lower urinary tract symptoms -     Tamsulosin HCl; Take 1 capsule (0.4 mg total) by mouth 2 (two) times daily.  Dispense: 90 capsule; Refill: 3  Hypertension associated with diabetes (HCC) Assessment & Plan: Still not at goal Will stop losartan start valsartan 320 mg once daily  Advised to monitor blood pressure daily and f/u with me via mychart in about ten days with log  Will adjust if needed.  Watch sodium intake.   Orders: -     Valsartan; Take 1 tablet (320 mg total) by mouth daily.  Dispense: 90 tablet; Refill: 3  Right calf pain -     VAS [place] ABI WITH/WO TBI; Future  Chronic pain of right lower extremity Assessment & Plan: Calf pain, ongoing, chronic  Negative dvt u/s fall 2023  Advised to start wearing compression stockings and elevate daily  Will order ABI's to r/o venous insuff  Orders: -     VAS [place] ABI WITH/WO TBI; Future     Return in about 3 months (around 10/08/2022) for f/u CPE, f/u blood pressure.  Mort Sawyers, MSN, APRN, FNP-C [facility]

## 2022-07-08 NOTE — Assessment & Plan Note (Signed)
Calf pain, ongoing, chronic  Negative dvt u/s fall 2023  Advised to start wearing compression stockings and elevate daily  Will order ABI's to r/o venous insuff

## 2022-07-09 DIAGNOSIS — R2681 Unsteadiness on feet: Secondary | ICD-10-CM | POA: Diagnosis not present

## 2022-07-09 DIAGNOSIS — M25561 Pain in right knee: Secondary | ICD-10-CM | POA: Diagnosis not present

## 2022-07-09 DIAGNOSIS — M25562 Pain in left knee: Secondary | ICD-10-CM | POA: Diagnosis not present

## 2022-07-10 ENCOUNTER — Emergency Department: Payer: Medicare Other

## 2022-07-10 ENCOUNTER — Emergency Department
Admission: EM | Admit: 2022-07-10 | Discharge: 2022-07-10 | Disposition: A | Discharge status: Home / Self Care | Payer: Medicare Other | Attending: Student in an Organized Health Care Education/Training Program | Admitting: Student in an Organized Health Care Education/Training Program

## 2022-07-10 ENCOUNTER — Ambulatory Visit: Payer: Self-pay

## 2022-07-10 ENCOUNTER — Other Ambulatory Visit: Payer: Self-pay

## 2022-07-10 ENCOUNTER — Ambulatory Visit
Admission: EM | Admit: 2022-07-10 | Discharge: 2022-07-10 | Disposition: A | Discharge status: Home / Self Care | Payer: Medicare Other

## 2022-07-10 DIAGNOSIS — S0083XA Contusion of other part of head, initial encounter: Secondary | ICD-10-CM

## 2022-07-10 DIAGNOSIS — W19XXXA Unspecified fall, initial encounter: Secondary | ICD-10-CM | POA: Diagnosis not present

## 2022-07-10 DIAGNOSIS — Z043 Encounter for examination and observation following other accident: Secondary | ICD-10-CM | POA: Diagnosis not present

## 2022-07-10 DIAGNOSIS — R0781 Pleurodynia: Secondary | ICD-10-CM | POA: Insufficient documentation

## 2022-07-10 DIAGNOSIS — S0993XA Unspecified injury of face, initial encounter: Secondary | ICD-10-CM | POA: Diagnosis not present

## 2022-07-10 DIAGNOSIS — S0011XA Contusion of right eyelid and periocular area, initial encounter: Secondary | ICD-10-CM | POA: Diagnosis not present

## 2022-07-10 DIAGNOSIS — S0990XA Unspecified injury of head, initial encounter: Secondary | ICD-10-CM | POA: Diagnosis present

## 2022-07-10 MED ORDER — LIDOCAINE 5 % EX PTCH
1.0000 | MEDICATED_PATCH | CUTANEOUS | Status: DC
  Administered 2022-07-10: 1 via TRANSDERMAL
  Filled 2022-07-10: qty 1

## 2022-07-10 MED ORDER — LIDOCAINE 5 % EX PTCH: 1.0000 | MEDICATED_PATCH | Freq: Two times a day (BID) | CUTANEOUS | 0 refills | Status: DC

## 2022-07-10 NOTE — ED Notes (Signed)
Pt alert, NAD, calm, interactive, resps e/u, speaking in clear complete sentences. C/o R lower anterior rib pain. No bruising to ribs evident. C/o R facial pain, bruising and abrasion. No active bleeding.

## 2022-07-10 NOTE — ED Notes (Signed)
Pt not in room, pt in radiology.  

## 2022-07-10 NOTE — ED Provider Notes (Signed)
[facility] Provider Note    Event Date/Time   First MD Initiated Contact with Patient 07/10/22 1530     (approximate)   History   Fall   HPI  JESS PILLARD is a 89 y.o. male presents the ER for evaluation of swelling contusion to the right forehead and cheek as well as tenderness along the right rib cage after mechanical fall last night.  Denies any abdominal pain.  No LOC.  No numbness or tingling.  No blurry vision.  Not on any blood thinners.     Physical Exam   Triage Vital Signs: ED Triage Vitals  Enc Vitals Group     BP 07/10/22 1108 (!) 140/108     Pulse Rate 07/10/22 1108 72     Resp 07/10/22 1108 19     Temp 07/10/22 1108 97.8 F (36.6 C)     Temp Source 07/10/22 1108 Oral     SpO2 07/10/22 1108 98 %     Weight 07/10/22 1059 185 lb 3 oz (84 kg)     Height 07/10/22 1059 5\' 10"  (1.778 m)     Head Circumference --      Peak Flow --      Pain Score 07/10/22 1059 7     Pain Loc --      Pain Edu? --      Excl. in GC? --     Most recent vital signs: Vitals:   07/10/22 1541 07/10/22 1543  BP:    Pulse: (!) 56 (!) 54  Resp:    Temp:    SpO2: 100% 99%     Constitutional: Alert  Eyes: Conjunctivae are normal.  Head: Contusion and ecchymosis of the right cheek and lateral eyebrow with 1 cm superficial abrasion hemostatic. Nose: No congestion/rhinnorhea. Mouth/Throat: Mucous membranes are moist.   Neck: Painless ROM.  Cardiovascular:   Good peripheral circulation. Respiratory: Normal respiratory effort.  No retractions.  Gastrointestinal: Soft and nontender in all 4 quadrants to deep palpation. Musculoskeletal:  no deformity no chest wall deformity crepitus or ecchymosis Neurologic:  MAE spontaneously. No gross focal neurologic deficits are appreciated.  Skin:  Skin is warm, dry and intact. No rash noted. Psychiatric: Mood and affect are normal. Speech and behavior are normal.    ED Results / Procedures / Treatments    Labs (all labs ordered are listed, but only abnormal results are displayed) Labs Reviewed - No data to display   EKG     RADIOLOGY Please see ED Course for my review and interpretation.  I personally reviewed all radiographic images ordered to evaluate for the above acute complaints and reviewed radiology reports and findings.  These findings were personally discussed with the patient.  Please see medical record for radiology report.    PROCEDURES:  Critical Care performed: No  Procedures   MEDICATIONS ORDERED IN ED: Medications  lidocaine (LIDODERM) 5 % 1 patch (has no administration in time range)     IMPRESSION / MDM / ASSESSMENT AND PLAN / ED COURSE  I reviewed the triage vital signs and the nursing notes.                              Differential diagnosis includes, but is not limited to, SDH, IPH, concussion, contusion, fracture, pneumothorax,  Patient presented to the ER for evaluation of injuries as described above after mechanical fall.  Patient well-appearing in no acute distress  does have evidence of traumatic injury there minor currently.  CT imaging ordered out of triage on my review and interpretation does not show any evidence of SDH or IPH.  Chest x-ray without any evidence of pneumothorax.  No evidence of rib fractures.  Given his reassuring exam I do not feel that further diagnostic testing clinically indicated.  Will treat for possible bruised ribs or contusion.  Have a low suspicion for viscus injury.  Patient declining any additional pain medication.  Will be prescribed incentive spirometer.  Discussed return precautions.  Patient agreeable to plan.   Clinical Course as of 07/10/22 1546  Sat Jul 10, 2022  1530 DG Ribs Unilateral W/Chest Right [PR]    Clinical Course User Index [PR] Willy Eddy, MD     FINAL CLINICAL IMPRESSION(S) / ED DIAGNOSES   Final diagnoses:  Contusion of face, initial encounter  Fall, initial encounter      Rx / DC Orders   ED Discharge Orders          Ordered    lidocaine (LIDODERM) 5 %  Every 12 hours        07/10/22 1544             Note:  This document was prepared using Dragon voice recognition software and may include unintentional dictation errors.    Willy Eddy, MD 07/10/22 [phone]

## 2022-07-10 NOTE — ED Triage Notes (Signed)
Pt reports tripped and fell last pm. Pt with bruising around his right eye. Pt reports has pain to his right ribs. Denies LOC, denies HA today.

## 2022-07-10 NOTE — ED Notes (Signed)
Patient is being discharged from the Urgent Care and sent to the Emergency Department via POV with family. Per S. Immordino FNP, patient is in need of higher level of care due to fall and need for further evaluation. Patient is aware and verbalizes understanding of plan of care. Patient discharged prior to vital sign check.

## 2022-07-10 NOTE — Discharge Instructions (Signed)
Recommend transport to the ED for immediate evaluation of his injuries including imaging.

## 2022-07-10 NOTE — ED Provider Notes (Signed)
Kyle Simon    CSN: [id number] Arrival date & time: 07/10/22  1028      History   Chief Complaint No chief complaint on file.   HPI THADDEAUS Simon is a 89 y.o. male.   HPI  Provider called to exam room during triage.  Accompanied by family members who reported the patient tripped and fell last p.m. he has significant bruising around his right eye with swelling.  Also reports pain to right ribs.  Denies loss of consciousness.  Denies headache or vision changes.  Past Medical History:  Diagnosis Date  . Arthritis    both hands;Dr.Deveshwar  . Constipation due to pain medication   . Diabetes mellitus without complication (HCC)    Type II. Uses no medication. Pt controls with diet and weight  . Enlarged prostate   . Frequent urination at night   . GERD (gastroesophageal reflux disease)   . History of noncompliance with medical treatment, presenting hazards to health 11/20/2018  . Hyperlipidemia associated with type 2 diabetes mellitus (HCC) 04/19/2007   Qualifier: Diagnosis of  By: Alwyn Ren MD, Chrissie Noa    . Hypertension   . Hypothyroidism   . Polio    as a child w/o complications  . Snoring    no sleep apnea  . Statin declined-  11/20/2018   patient understands risks associated with this decision and has been advised to restart by cardiologist as well as myself several times  . Torn meniscus   . Transfusion history 2012   post TKR    Patient Active Problem List   Diagnosis Date Noted  . Chronic pain of right lower extremity 07/08/2022  . Elevated fecal calprotectin 05/06/2022  . Chronic diarrhea 04/27/2022  . Tinnitus of both ears 04/09/2022  . Poor dentition 04/02/2022  . Disorder of bone density and structure, unspecified 01/15/2022  . Memory loss 11/26/2021  . Balance problems 12/17/2020  . Chronic pain of left ankle 12/17/2020  . Dyslipidemia 11/30/2020  . Hyperkalemia 02/05/2020  . Chronic synovitis 01/18/2020  . Thoracic aortic aneurysm (HCC)  12/10/2019  . SIADH (syndrome of inappropriate ADH production) (HCC) 03/21/2019  . Elevated coronary artery calcium score 11/25/2018  . Aortic atherosclerosis (HCC) 10/05/2017  . Lethargy 08/22/2017  . Chronic pain of left knee 08/22/2017  . Lumbar radiculopathy 06/16/2017  . DM assoc with CKD (chronic kidney disease), stage III (HCC) 04/14/2017  . Arthralgia of right temporomandibular joint 03/02/2017  . Chronic eczematous otitis externa of both ears 03/02/2017  . Presbycusis of both ears 03/02/2017  . ETD (Eustachian tube dysfunction), right 09/15/2016  . Trochanteric bursitis of right hip 09/07/2016  . Age-related osteoporosis without current pathological fracture 07/16/2016  . Hypomagnesemia 12/29/2015  . Constipation 08/06/2015  . Type 2 diabetes mellitus with other specified complication (HCC) 08/05/2015  . Complete tear of right rotator cuff 12/03/2014  . Chronic rhinitis 11/01/2014  . Hypertension associated with diabetes (HCC) 03/20/2014  . Spondylolisthesis of lumbar region 10/23/2013  . Diverticulosis of colon without hemorrhage 01/22/2013  . Anemia, unspecified 08/13/2012  . GERD (gastroesophageal reflux disease) 03/26/2011  . SENILE KERATOSIS 06/12/2008  . Hyperlipidemia associated with type 2 diabetes mellitus (HCC) 04/19/2007  . BPH (benign prostatic hyperplasia) 04/18/2007  . Hypothyroidism 04/20/2006    Past Surgical History:  Procedure Laterality Date  . ANTERIOR LAT LUMBAR FUSION Right 11/03/2017   Procedure: Right Lumbar three-four Anterolateral lumbar interbody fusion with lateral plate;  Surgeon: Maeola Harman, MD;  Location[facility] OR;  Service: Neurosurgery;  Laterality: Right;  . BACK SURGERY  2011   hemiarthroplasty 01/11/1999 and 6  . CARDIAC CATHETERIZATION  09/2001   negative  . COLONOSCOPY    . COLONOSCOPY N/A 05/06/2022   Procedure: COLONOSCOPY;  Surgeon: Toney Reil, MD;  Location: [facility] ENDOSCOPY;  Service: Gastroenterology;  Laterality: N/A;   . COLONOSCOPY WITH PROPOFOL N/A 05/05/2022   Procedure: COLONOSCOPY WITH PROPOFOL;  Surgeon: Toney Reil, MD;  Location: [facility] ENDOSCOPY;  Service: Gastroenterology;  Laterality: N/A;  . EYE SURGERY  2003   R&L cataract removed & IOL- 2003 & 2007,Dr.Jenkins  . INGUINAL HERNIA REPAIR  2002   right, Dr. Luan Pulling  . JOINT REPLACEMENT  2012   left knee replacement  . LIPOMA EXCISION     back  . MENISCECTOMY Bilateral    Dr. Thurston Hole  . PROSTATE SURGERY  07/2006   for urinary urgency, Dr. Isabel Caprice  . ROTATOR CUFF REPAIR Right October, 2016  . sinus & throat surgery-1995  1995  . TONSILLECTOMY    . TOTAL KNEE ARTHROPLASTY  01/11/2011   Procedure: TOTAL KNEE ARTHROPLASTY;  Surgeon: Raymon Mutton, MD;  Location: [facility] OR;  Service: Orthopedics;  Laterality: Left;  . TOTAL KNEE ARTHROPLASTY Right 11/22/2016   Procedure: TOTAL KNEE ARTHROPLASTY;  Surgeon: Dannielle Huh, MD;  Location: [facility] OR;  Service: Orthopedics;  Laterality: Right;  . UVULECTOMY  1996   Dr. Lazarus Salines  . VASECTOMY    . WRIST SURGERY  1959   fracture- right  . XI ROBOTIC ASSISTED VENTRAL HERNIA N/A 09/07/2020   Procedure: XI ROBOTIC ASSISTED DIAGNOSTIC LAPAROSCOPY;  Surgeon: Leafy Ro, MD;  Location: [facility] ORS;  Service: General;  Laterality: N/A;       Home Medications    Prior to Admission medications   Medication Sig Start Date End Date Taking? Authorizing Provider  acetaminophen (TYLENOL) 500 MG tablet Take 1,000 mg by mouth every 6 (six) hours as needed for mild pain. Takes 2 tablets nightly    [provider]  ammonium lactate (AMLACTIN DAILY) 12 % lotion Apply 1 Application topically as needed for dry skin. 03/30/22   Candelaria Stagers, DPM  aspirin 81 MG tablet Take 81 mg by mouth every evening.     [provider]  celecoxib (CELEBREX) 200 MG capsule Take 200 mg by mouth 2 (two) times daily. 09/18/19   [provider]  CLOBETASOL PROPIONATE E 0.05 % emollient cream as needed. 07/09/21    [provider]  diclofenac Sodium (VOLTAREN) 1 % GEL Apply 2 g topically at bedtime. 09/10/19   [provider]  finasteride (PROSCAR) 5 MG tablet Take 5 mg by mouth daily. 07/09/21   [provider]  fludrocortisone (FLORINEF) 0.1 MG tablet 1 tablet 3 days a week 08/20/21   Reather Littler, MD  Hypromellose (ARTIFICIAL TEARS OP) Place 1 drop into both eyes 2 (two) times daily.    [provider]  lipase/protease/amylase (CREON) 36000 UNITS CPEP capsule Take 1 capsules with the first bite of each meal and 1 capsule with the first bite of each snack 05/10/22   Vanga, Loel Dubonnet, MD  mometasone (ELOCON) 0.1 % ointment Apply topically daily. 05/19/22   [provider]  Omega-3 Fatty Acids (FISH OIL) 1000 MG CAPS Take 1,000 mg by mouth every evening.    [provider]  polyethylene glycol (MIRALAX) 17 g packet Take 17 g by mouth daily as needed for mild constipation or moderate constipation. 01/07/22   Mort Sawyers, FNP  rosuvastatin (CRESTOR) 5 MG tablet Take 1 tablet (5 mg total) by mouth at bedtime. 07/31/21   Gweneth Dimitri, MD  SYNTHROID 137 MCG tablet TAKE 1 TABLET DAILY BEFORE BREAKFAST 01/06/22   Reather Littler, MD  tamsulosin (FLOMAX) 0.4 MG CAPS capsule Take 1 capsule (0.4 mg total) by mouth 2 (two) times daily. 07/08/22   Mort Sawyers, FNP  valsartan (DIOVAN) 320 MG tablet Take 1 tablet (320 mg total) by mouth daily. 07/08/22   Mort Sawyers, FNP    Family History Family History  Problem Relation Age of Onset  . Coronary artery disease Mother   . Emphysema Mother   . Diverticulosis Mother   . Thyroid disease Mother   . Sudden death Father        accident  . Asthma Brother   . Bone cancer Brother   . Thyroid disease Daughter   . Sarcoidosis Son   . Diabetes Paternal Grandmother   . Anesthesia problems Neg Hx   . Hypotension Neg Hx   . Malignant hyperthermia Neg Hx   . Pseudochol deficiency Neg Hx   . Colon cancer Neg Hx     Social  History Social History   Tobacco Use  . Smoking status: Former    Packs/day: 1.00    Years: 18.00    Additional pack years: 0.00    Total pack years: 18.00    Types: Cigarettes    Quit date: 03/01/1966    Years since quitting: 56.3    Passive exposure: Never  . Smokeless tobacco: Never  Vaping Use  . Vaping Use: Never used  Substance Use Topics  . Alcohol use: Yes    Alcohol/week: 4.0 standard drinks of alcohol    Types: 4 Shots of liquor per week    Comment: social, few drinks a week  . Drug use: No     Allergies   Simvastatin and Oxycodone   Review of Systems Review of Systems   Physical Exam Triage Vital Signs ED Triage Vitals  Enc Vitals Group     BP      Pulse      Resp      Temp      Temp src      SpO2      Weight      Height      Head Circumference      Peak Flow      Pain Score      Pain Loc      Pain Edu?      Excl. in GC?    No data found.  Updated Vital Signs There were no vitals taken for this visit.  Visual Acuity Right Eye Distance:   Left Eye Distance:   Bilateral Distance:    Right Eye Near:   Left Eye Near:    Bilateral Near:     Physical Exam Eyes:      UC Treatments / Results  Labs (all labs ordered are listed, but only abnormal results are displayed) Labs Reviewed - No data to display  EKG   Radiology No results found.  Procedures Procedures (including critical care time)  Medications Ordered in UC Medications - No data to display  Initial Impression / Assessment and Plan / UC Course  I have reviewed the triage vital signs and the nursing notes.  Pertinent labs & imaging results that were available during my care of the patient were reviewed by me and considered in my medical decision making (see chart  for details).   Patient has bruising and swelling around his right eye and I expressed concern for possible orbital fracture.  Recommended patient be transported to the ED for immediate evaluation including  likely imaging.  Reviewed chart history. Additional history obtained from patient family/caregiver present during the exam.  Counseled patient on potential for adverse effects with medications prescribed/recommended today, ER and return-to-clinic precautions discussed, patient verbalized understanding and agreement with care plan.  Final Clinical Impressions(s) / UC Diagnoses   Final diagnoses:  None   Discharge Instructions   None    ED Prescriptions   None    PDMP not reviewed this encounter.   Charma Igo, [place] 07/10/22 1117

## 2022-07-10 NOTE — ED Notes (Signed)
EDP at [facility] 

## 2022-07-13 ENCOUNTER — Ambulatory Visit (INDEPENDENT_AMBULATORY_CARE_PROVIDER_SITE_OTHER): Payer: Medicare Other | Admitting: Family

## 2022-07-13 ENCOUNTER — Encounter: Payer: Self-pay | Admitting: Family

## 2022-07-13 ENCOUNTER — Telehealth: Payer: Self-pay | Admitting: *Deleted

## 2022-07-13 VITALS — BP 148/80 | HR 62 | Temp 97.6°F | Ht 70.0 in | Wt 185.2 lb

## 2022-07-13 DIAGNOSIS — R0789 Other chest pain: Secondary | ICD-10-CM | POA: Insufficient documentation

## 2022-07-13 DIAGNOSIS — G8929 Other chronic pain: Secondary | ICD-10-CM | POA: Diagnosis not present

## 2022-07-13 DIAGNOSIS — J324 Chronic pansinusitis: Secondary | ICD-10-CM | POA: Insufficient documentation

## 2022-07-13 DIAGNOSIS — M25572 Pain in left ankle and joints of left foot: Secondary | ICD-10-CM | POA: Diagnosis not present

## 2022-07-13 DIAGNOSIS — E1159 Type 2 diabetes mellitus with other circulatory complications: Secondary | ICD-10-CM

## 2022-07-13 DIAGNOSIS — I152 Hypertension secondary to endocrine disorders: Secondary | ICD-10-CM | POA: Diagnosis not present

## 2022-07-13 DIAGNOSIS — R0781 Pleurodynia: Secondary | ICD-10-CM | POA: Diagnosis not present

## 2022-07-13 DIAGNOSIS — J3489 Other specified disorders of nose and nasal sinuses: Secondary | ICD-10-CM | POA: Diagnosis not present

## 2022-07-13 MED ORDER — ACETAMINOPHEN-CODEINE 300-30 MG PO TABS
1.0000 | ORAL_TABLET | ORAL | 0 refills | Status: DC | PRN
Start: 2022-07-13 — End: 2022-08-17

## 2022-07-13 MED ORDER — DICLOFENAC SODIUM 1 % EX GEL
2.0000 g | Freq: Every evening | CUTANEOUS | 2 refills | Status: AC
Start: 2022-07-13 — End: ?

## 2022-07-13 NOTE — Assessment & Plan Note (Signed)
Seen on CT imaging  Referral placed for ENT as possible polyp causing obstruction

## 2022-07-13 NOTE — Assessment & Plan Note (Signed)
Continue with lidocaine patches and tylenol extra strength every four hours prn

## 2022-07-13 NOTE — Assessment & Plan Note (Signed)
Advised pt to f/u with cardiology as ongoing elevated blood pressure. Possibility of syncope however nobody visualized him going down, and he can not quite recollect why he fell.  Ddx also with chronic opacification sinuses could have caused dizziness/lightheadedness contributing to the fall.  Continue medication as prescribed.  Monitor blood pressure twice daily.

## 2022-07-13 NOTE — Progress Notes (Signed)
Established Patient Office Visit  Subjective:   Patient ID: Kyle Simon, male    DOB: 08/18/1932  Age: 89 y.o. MRN: [id number]  CC:  Chief Complaint  Patient presents with   Rib Injury    Kyle Simon    HPI: Kyle Simon is a 89 y.o. male presenting on 07/13/2022 for Rib Injury Kyle Simon)   HPI  Went to ER 5/11 at [facility] after falling at home. He fell landing on his right side/rib cage area and also hit his right forehead and right cheek. He did have swelling and contusion to the right forehead and cheek.   He states when walking into the laundry room, he stepped up a step and then he doesn't remember how exactly he fell but then he knew he was on the floor and felt the fall. He states he did not pass out. He states his daughter did witness him after he fell and not before he fell.   BP in ER was 140/108 CT cervical spine without contrast: no acute findings however with small right cheek contusion, complete opacification left maxillary ethmoid and frontal sinuses from obstruction suspected to be polyp. [place] lipoma left lower neck  CT head w/o contrast: chronic small vessel ischemia , h/o chronic perforator infarct no acute findings CXR without acute findings  No rib fractures seen on xray Was discharged with rx for incentive spirometer   Average blood pressure seems to fluctuate at home Average seems to be fluctuating from anywhere from 134 systolic to max 197 in the am, diastolic average lowest 61 to max 82. In the evenings average systolic was between 143/172 and diastolic average 24/40         ROS: Negative unless specifically indicated above in HPI.   Relevant past medical history reviewed and updated as indicated.   Allergies and medications reviewed and updated.   Current Outpatient Medications:    acetaminophen-codeine (TYLENOL #3) 300-30 MG tablet, Take 1 tablet by mouth every 4 (four) hours as needed for moderate pain., Disp:  30 tablet, Rfl: 0   ammonium lactate (AMLACTIN DAILY) 12 % lotion, Apply 1 Application topically as needed for dry skin., Disp: 400 g, Rfl: 0   aspirin 81 MG tablet, Take 81 mg by mouth every evening. , Disp: , Rfl:    celecoxib (CELEBREX) 200 MG capsule, Take 200 mg by mouth 2 (two) times daily., Disp: , Rfl:    CLOBETASOL PROPIONATE E 0.05 % emollient cream, as needed., Disp: , Rfl:    finasteride (PROSCAR) 5 MG tablet, Take 5 mg by mouth daily., Disp: , Rfl:    fludrocortisone (FLORINEF) 0.1 MG tablet, 1 tablet 3 days a week, Disp: 40 tablet, Rfl: 3   Hypromellose (ARTIFICIAL TEARS OP), Place 1 drop into both eyes 2 (two) times daily., Disp: , Rfl:    lidocaine (LIDODERM) 5 %, Place 1 patch onto the skin every 12 (twelve) hours. Remove & Discard patch within 12 hours or as directed by MD, Disp: 10 patch, Rfl: 0   lipase/protease/amylase (CREON) 36000 UNITS CPEP capsule, Take 1 capsules with the first bite of each meal and 1 capsule with the first bite of each snack, Disp: 450 capsule, Rfl: 1   mometasone (ELOCON) 0.1 % ointment, Apply topically daily., Disp: , Rfl:    Omega-3 Fatty Acids (FISH OIL) 1000 MG CAPS, Take 1,000 mg by mouth every evening., Disp: , Rfl:    polyethylene glycol (MIRALAX) 17  g packet, Take 17 g by mouth daily as needed for mild constipation or moderate constipation., Disp: 14 each, Rfl: 0   rosuvastatin (CRESTOR) 5 MG tablet, Take 1 tablet (5 mg total) by mouth at bedtime., Disp: 90 tablet, Rfl: 3   SYNTHROID 137 MCG tablet, TAKE 1 TABLET DAILY BEFORE BREAKFAST, Disp: 90 tablet, Rfl: 3   tamsulosin (FLOMAX) 0.4 MG CAPS capsule, Take 1 capsule (0.4 mg total) by mouth 2 (two) times daily., Disp: 90 capsule, Rfl: 3   valsartan (DIOVAN) 320 MG tablet, Take 1 tablet (320 mg total) by mouth daily., Disp: 90 tablet, Rfl: 3   diclofenac Sodium (VOLTAREN) 1 % GEL, Apply 2 g topically at bedtime., Disp: 100 g, Rfl: 2  Allergies  Allergen Reactions   Simvastatin Other (See  Comments)    MYALGIAS   Oxycodone Other (See Comments)    Severe constipation    Objective:   BP (!) 148/80   Pulse 62   Temp 97.6 F (36.4 C) (Temporal)   Ht 5\' 10"  (1.778 m)   Wt 185 lb 3.2 oz (84 kg)   SpO2 97%   BMI 26.57 kg/m    Physical Exam Constitutional:      General: He is not in acute distress.    Appearance: Normal appearance. He is normal weight. He is not ill-appearing, toxic-appearing or diaphoretic.  Cardiovascular:     Rate and Rhythm: Normal rate and regular rhythm.  Pulmonary:     Effort: Pulmonary effort is normal.     Breath sounds: Normal breath sounds. No decreased air movement. No decreased breath sounds.  Chest:     Comments: Right sided rib tenderness on palpation  Musculoskeletal:        General: Normal range of motion.  Neurological:     General: No focal deficit present.     Mental Status: He is alert and oriented to person, place, and time. Mental status is at baseline.  Psychiatric:        Mood and Affect: Mood normal.        Behavior: Behavior normal.        Thought Content: Thought content normal.        Judgment: Judgment normal.     Assessment & Plan:  Chronic pansinusitis Assessment & Plan: Seen on CT imaging  Referral placed for ENT as possible polyp causing obstruction   Orders: -     Ambulatory referral to ENT  Obstruction of sinonasal region -     Ambulatory referral to ENT  Chronic pain of left ankle -     Diclofenac Sodium; Apply 2 g topically at bedtime.  Dispense: 100 g; Refill: 2  Hypertension associated with diabetes [facility]) Assessment & Plan: Advised pt to f/u with cardiology as ongoing elevated blood pressure. Possibility of syncope however nobody visualized him going down, and he can not quite recollect why he fell.  Ddx also with chronic opacification sinuses could have caused dizziness/lightheadedness contributing to the fall.  Continue medication as prescribed.  Monitor blood pressure twice daily.     Rib pain on right side Assessment & Plan: Continue with lidocaine patches and tylenol extra strength every four hours prn   Orders: -     Acetaminophen-Codeine; Take 1 tablet by mouth every 4 (four) hours as needed for moderate pain.  Dispense: 30 tablet; Refill: 0     Follow up plan: Return in about 3 months (around 10/13/2022) for f/u blood pressure.  Mort Sawyers, FNP

## 2022-07-13 NOTE — Patient Instructions (Addendum)
  A referral was placed today for ENT.  Please let us know if you have not heard back within 2 weeks about the referral.  Call cardiologist for consult due to blood pressure issues and possible syncope.   Regards,   Mort Sawyers FNP-C

## 2022-07-13 NOTE — Transitions of Care (Post Inpatient/ED Visit) (Signed)
07/13/2022  Name: Kyle Simon MRN: [id number] DOB: [birth year]  Today's TOC FU Call Status: Today's TOC FU Call Status:: Successful TOC FU Call Competed TOC FU Call Complete Date: 07/13/22  Transition Care Management Follow-up Telephone Call Date of Discharge: 07/10/22 Discharge Facility: [facility]) Type of Discharge: Emergency Department Reason for ED Visit: Other: (fall contusion of face and ribs) How have you been since you were released from the hospital?: Better Any questions or concerns?: No  Items Reviewed: Did you receive and understand the discharge instructions provided?: Yes Medications obtained,verified, and reconciled?: Yes (Medications Reviewed) Any new allergies since your discharge?: No Dietary orders reviewed?: No Do you have support at home?: Yes People in Home: spouse Name of Support/Comfort Primary Source: June  Medications Reviewed Today: Medications Reviewed Today     Reviewed by Luella Cook, RN (Case Manager) on 07/13/22 at 1442  Med List Status: <None>   Medication Order Taking? Sig Documenting Provider Last Dose Status Informant  acetaminophen-codeine (TYLENOL #3) 300-30 MG tablet [id number] Yes Take 1 tablet by mouth every 4 (four) hours as needed for moderate pain. Mort Sawyers, FNP Taking Active   ammonium lactate (AMLACTIN DAILY) 12 % lotion [id number] Yes Apply 1 Application topically as needed for dry skin. Candelaria Stagers, DPM Taking Active   aspirin 81 MG tablet [id number] Yes Take 81 mg by mouth every evening.  [provider] Taking Active Self  celecoxib (CELEBREX) 200 MG capsule [id number] Yes Take 200 mg by mouth 2 (two) times daily. [provider] Taking Active Self  CLOBETASOL PROPIONATE E 0.05 % emollient cream [id number] Yes as needed. [provider] Taking Active   diclofenac Sodium (VOLTAREN) 1 % GEL [id number] Yes Apply 2 g topically at bedtime. Mort Sawyers, FNP Taking  Active   finasteride (PROSCAR) 5 MG tablet [id number] Yes Take 5 mg by mouth daily. [provider] Taking Active   fludrocortisone (FLORINEF) 0.1 MG tablet [id number] Yes 1 tablet 3 days a week Reather Littler, MD Taking Active   Hypromellose (ARTIFICIAL TEARS OP) [id number] Yes Place 1 drop into both eyes 2 (two) times daily. [provider] Taking Active Self  lidocaine (LIDODERM) 5 % [id number] Yes Place 1 patch onto the skin every 12 (twelve) hours. Remove & Discard patch within 12 hours or as directed by MD Willy Eddy, MD Taking Active   lipase/protease/amylase (CREON) 36000 UNITS CPEP capsule [id number] Yes Take 1 capsules with the first bite of each meal and 1 capsule with the first bite of each snack Vanga, Loel Dubonnet, MD Taking Active   mometasone (ELOCON) 0.1 % ointment [id number] Yes Apply topically daily. [provider] Taking Active   Omega-3 Fatty Acids (FISH OIL) 1000 MG CAPS [id number] Yes Take 1,000 mg by mouth every evening. [provider] Taking Active Self  polyethylene glycol (MIRALAX) 17 g packet [id number] Yes Take 17 g by mouth daily as needed for mild constipation or moderate constipation. Mort Sawyers, FNP Taking Active   rosuvastatin (CRESTOR) 5 MG tablet [id number] Yes Take 1 tablet (5 mg total) by mouth at bedtime. Gweneth Dimitri, MD Taking Active   SYNTHROID 137 MCG tablet [id number] Yes TAKE 1 TABLET DAILY BEFORE Woodroe Mode, MD Taking Active   tamsulosin [facility]) 0.4 MG CAPS capsule [id number] Yes Take 1 capsule (0.4 mg total) by mouth 2 (two) times daily. Mort Sawyers, FNP Taking Active   valsartan (DIOVAN) 320 MG tablet [id number] Yes Take 1 tablet (320 mg total) by  mouth daily. Mort Sawyers, FNP Taking Active             Home Care and Equipment/Supplies: Were Home Health Services Ordered?: NA Any new equipment or medical supplies ordered?: NA  Functional Questionnaire: Do you need assistance with  bathing/showering or dressing?: No Do you need assistance with meal preparation?: No Do you need assistance with eating?: No Do you have difficulty maintaining continence: No Do you need assistance with getting out of bed/getting out of a chair/moving?: No Do you have difficulty managing or taking your medications?: No  Follow up appointments reviewed: PCP Follow-up appointment confirmed?: Yes Date of PCP follow-up appointment?: 07/13/22 Follow-up Provider: [facility] Follow-up appointment confirmed?: NA Do you need transportation to your follow-up appointment?: No Do you understand care options if your condition(s) worsen?: Yes-patient verbalized understanding  SDOH Interventions Today    Flowsheet Row Most Recent Value  SDOH Interventions   Food Insecurity Interventions Intervention Not Indicated  Housing Interventions Intervention Not Indicated  Transportation Interventions Intervention Not Indicated      TOC Interventions Today    Flowsheet Row Most Recent Value  TOC Interventions   TOC Interventions Discussed/Reviewed TOC Interventions Discussed, TOC Interventions Reviewed  [Per patient PCP is referring to Cardiologist and ENT]        Gean Maidens BSN RN [facility] [phone]

## 2022-07-15 ENCOUNTER — Encounter: Payer: Self-pay | Admitting: *Deleted

## 2022-07-21 ENCOUNTER — Other Ambulatory Visit: Payer: Self-pay | Admitting: Family

## 2022-07-21 ENCOUNTER — Telehealth: Payer: Self-pay | Admitting: Family

## 2022-07-21 DIAGNOSIS — E1159 Type 2 diabetes mellitus with other circulatory complications: Secondary | ICD-10-CM

## 2022-07-21 MED ORDER — VALSARTAN 320 MG PO TABS
320.0000 mg | ORAL_TABLET | Freq: Every day | ORAL | 3 refills | Status: DC
Start: 2022-07-21 — End: 2022-09-13

## 2022-07-21 NOTE — Telephone Encounter (Signed)
I do not know why [facility] has not yet filled however I will [facility] to [facility]. Only way to check this out is for pt to call [facility] for more information.   The referral can take up to two weeks to have them call to set up to schedule. Placed 5/14 if you do not hear anything by next Wednesday please have them let me know.

## 2022-07-21 NOTE — Telephone Encounter (Signed)
To add on previous note, Patient states he has not began taking this medication yet due to the delay with [facility] and has not seen much improvement with his blood pressure. Please advise, thank you.

## 2022-07-21 NOTE — Telephone Encounter (Signed)
Patient contacted the office stating that [facility] had not received a response to fill medication for this patient. Patient wanted to contact the office to see if we had received anything regarding this.    valsartan (DIOVAN) 320 MG tablet

## 2022-07-21 NOTE — Telephone Encounter (Signed)
   Reason for Referral Request: Blockage in L nostril   Has patient been seen PCP for this complaint? Yes  No,  please schedule patient for appointment for complaint.  Patient scheduled on:   Yes, please find out following information.  Referral for which specialty: ENT  Preferred office/provider: Any preferred by provider

## 2022-07-22 NOTE — Telephone Encounter (Signed)
Left message to return call to our office.  

## 2022-07-29 DIAGNOSIS — H04123 Dry eye syndrome of bilateral lacrimal glands: Secondary | ICD-10-CM | POA: Diagnosis not present

## 2022-07-29 DIAGNOSIS — H5319 Other subjective visual disturbances: Secondary | ICD-10-CM | POA: Diagnosis not present

## 2022-07-29 DIAGNOSIS — Z961 Presence of intraocular lens: Secondary | ICD-10-CM | POA: Diagnosis not present

## 2022-07-29 DIAGNOSIS — H5021 Vertical strabismus, right eye: Secondary | ICD-10-CM | POA: Diagnosis not present

## 2022-07-29 DIAGNOSIS — H532 Diplopia: Secondary | ICD-10-CM | POA: Diagnosis not present

## 2022-07-29 DIAGNOSIS — E11319 Type 2 diabetes mellitus with unspecified diabetic retinopathy without macular edema: Secondary | ICD-10-CM | POA: Diagnosis not present

## 2022-07-29 LAB — HM DIABETES EYE EXAM

## 2022-07-30 NOTE — Telephone Encounter (Signed)
LMTCB

## 2022-08-09 DIAGNOSIS — Z96652 Presence of left artificial knee joint: Secondary | ICD-10-CM | POA: Diagnosis not present

## 2022-08-14 ENCOUNTER — Other Ambulatory Visit: Payer: Self-pay | Admitting: Gastroenterology

## 2022-08-16 NOTE — Telephone Encounter (Signed)
Last office visit 04/07/2022  chronic diarrhea  Plan: Kyle Simon is a 89 y.o. pleasant Caucasian male with history of high-grade small bowel obstruction s/p lysis of adhesions in 08/2020 is seen in consultation for 1 year history of nonbloody diarrhea   Recommend GI profile PCR, pancreatic fecal elastase levels, fecal calprotectin levels Check H. pylori stool antigen test Check celiac disease panel Discussed about flexible sigmoidoscopy if above workup is unremarkable Advised patient to avoid usage of laxatives.  Take MiraLAX as needed instead   Follow up after the above workup   Last refill 05/10/2022 450 caps 1 refills  No appointment is schedule

## 2022-08-17 ENCOUNTER — Ambulatory Visit (INDEPENDENT_AMBULATORY_CARE_PROVIDER_SITE_OTHER): Payer: Medicare Other | Admitting: Family

## 2022-08-17 ENCOUNTER — Encounter: Payer: Self-pay | Admitting: Family

## 2022-08-17 VITALS — BP 136/80 | HR 56 | Temp 97.6°F | Ht 70.0 in | Wt 183.0 lb

## 2022-08-17 DIAGNOSIS — M6281 Muscle weakness (generalized): Secondary | ICD-10-CM | POA: Diagnosis not present

## 2022-08-17 DIAGNOSIS — M62838 Other muscle spasm: Secondary | ICD-10-CM | POA: Diagnosis not present

## 2022-08-17 LAB — MAGNESIUM: Magnesium: 1.9 mg/dL (ref 1.5–2.5)

## 2022-08-17 LAB — BASIC METABOLIC PANEL
BUN: 15 mg/dL (ref 6–23)
CO2: 27 mEq/L (ref 19–32)
Calcium: 8.6 mg/dL (ref 8.4–10.5)
Chloride: 96 mEq/L (ref 96–112)
Creatinine, Ser: 0.8 mg/dL (ref 0.40–1.50)
GFR: 79.59 mL/min (ref >60.00)
Glucose, Bld: 130 mg/dL — ABNORMAL HIGH (ref 70–99)
Potassium: 4 mEq/L (ref 3.5–5.1)
Sodium: 132 mEq/L — ABNORMAL LOW (ref 135–145)

## 2022-08-17 LAB — CK: Total CK: 55 U/L (ref 7–232)

## 2022-08-17 MED ORDER — LIDOCAINE 5 % EX PTCH
1.0000 | MEDICATED_PATCH | Freq: Two times a day (BID) | CUTANEOUS | 3 refills | Status: DC
Start: 2022-08-17 — End: 2023-03-24

## 2022-08-17 NOTE — Progress Notes (Signed)
Established Patient Office Visit  Subjective:   Patient ID: Kyle Simon, male    DOB: December 08, 1932  Age: 89 y.o. MRN: [id number]  CC:  Chief Complaint  Patient presents with   Extremity Weakness    Left leg with some numbness     HPI: Kyle Simon is a 89 y.o. male presenting on 08/17/2022 for Extremity Weakness (Left leg with some numbness )  [place]arey Seat a second time, about ten days ago, and he fell and hit the pavement with his left knee. He states later that night he went to stand up after eating at the dinner table and he felt sharp stabbing spasming pain on his left quadricept area. He could only fall asleep by using lidocaine patches. Tylenol and tramadol did not help. Sports medicine doctor did order left knee xray with no traumatic or acute changes. He did also suggest pt start with physical therapy and he is working on scheduling this. Does still state with an aching pain in his upper thigh /quadricep and feels tight.   Does have h/o tkr left side using cement, he has recently seen Dr. Rodman Comp his sports medicine doctor who evaluated him as well.  He does state he drinks a good amount of water, not sure if he possibly had been dehydrated or not. He had had a busy weekend and a lot of family was at the house and they were very busy.   Does state has recently seen eye doctor for double vision. They state no abnormalities found on exam however they scheduled follow up especially if ongoing concerns which he does state even this am he had double vision watching the tv.   He does state he also has a f/u coming up with retina specialist. Denies any urinary symptoms out of the ordinary.   Chronic pansinusitis: he has an appt tomorrow with [facility] so he no longer needs the referral for the ENT that we placed.      ROS: Negative unless specifically indicated above in HPI.   Relevant past medical history reviewed and updated as indicated.   Allergies and medications  reviewed and updated.   Current Outpatient Medications:    ammonium lactate (AMLACTIN DAILY) 12 % lotion, Apply 1 Application topically as needed for dry skin., Disp: 400 g, Rfl: 0   celecoxib (CELEBREX) 200 MG capsule, Take 200 mg by mouth 2 (two) times daily., Disp: , Rfl:    CLOBETASOL PROPIONATE E 0.05 % emollient cream, as needed., Disp: , Rfl:    diclofenac Sodium (VOLTAREN) 1 % GEL, Apply 2 g topically at bedtime., Disp: 100 g, Rfl: 2   finasteride (PROSCAR) 5 MG tablet, Take 5 mg by mouth daily., Disp: , Rfl:    fludrocortisone (FLORINEF) 0.1 MG tablet, 1 tablet 3 days a week, Disp: 40 tablet, Rfl: 3   Hypromellose (ARTIFICIAL TEARS OP), Place 1 drop into both eyes 2 (two) times daily., Disp: , Rfl:    lipase/protease/amylase (CREON) 36000 UNITS CPEP capsule, TAKE 1 CAPSULE WITH THE FIRST BITE OF EACH MEAL AND 1 CAPSULE WITH THE FIRST BITE OF EACH SNACK, Disp: 450 capsule, Rfl: 0   mometasone (ELOCON) 0.1 % ointment, Apply topically daily., Disp: , Rfl:    Omega-3 Fatty Acids (FISH OIL) 1000 MG CAPS, Take 1,000 mg by mouth every evening., Disp: , Rfl:    polyethylene glycol (MIRALAX) 17 g packet, Take 17 g by mouth daily as needed for mild constipation or moderate constipation., Disp: 14  each, Rfl: 0   rosuvastatin (CRESTOR) 5 MG tablet, Take 1 tablet (5 mg total) by mouth at bedtime., Disp: 90 tablet, Rfl: 3   SYNTHROID 137 MCG tablet, TAKE 1 TABLET DAILY BEFORE BREAKFAST, Disp: 90 tablet, Rfl: 3   tamsulosin (FLOMAX) 0.4 MG CAPS capsule, Take 1 capsule (0.4 mg total) by mouth 2 (two) times daily., Disp: 90 capsule, Rfl: 3   valsartan (DIOVAN) 320 MG tablet, Take 1 tablet (320 mg total) by mouth daily., Disp: 90 tablet, Rfl: 3   aspirin 81 MG tablet, Take 81 mg by mouth every evening.  (Patient not taking: Reported on 08/17/2022), Disp: , Rfl:    lidocaine (LIDODERM) 5 %, Place 1 patch onto the skin every 12 (twelve) hours. Remove & Discard patch within 12 hours or as directed by MD,  Disp: 180 patch, Rfl: 3  Allergies  Allergen Reactions   Simvastatin Other (See Comments)    MYALGIAS   Oxycodone Other (See Comments)    Severe constipation    Objective:   BP 136/80   Pulse (!) 56   Temp 97.6 F (36.4 C) (Temporal)   Ht 5\' 10"  (1.778 m)   Wt 183 lb (83 kg)   SpO2 (!) 56%   BMI 26.26 kg/m    Physical Exam Constitutional:      General: He is not in acute distress.    Appearance: Normal appearance. He is normal weight. He is not ill-appearing, toxic-appearing or diaphoretic.  Cardiovascular:     Rate and Rhythm: Normal rate and regular rhythm.  Pulmonary:     Effort: Pulmonary effort is normal.     Breath sounds: Normal breath sounds.  Musculoskeletal:     Left upper leg: No swelling, edema or tenderness.     Left knee: No effusion. Decreased range of motion.  Neurological:     General: No focal deficit present.     Mental Status: He is alert and oriented to person, place, and time. Mental status is at baseline.  Psychiatric:        Mood and Affect: Mood normal.        Behavior: Behavior normal.        Thought Content: Thought content normal.        Judgment: Judgment normal.     Assessment & Plan:  Muscle spasm Assessment & Plan: Ordering ck mag and potassium pending results.  Advised to continue good amount of water intake daily as well.  Avoid dehydration as able.    Orders: -     Magnesium -     Basic metabolic panel -     CK -     Lidocaine; Place 1 patch onto the skin every 12 (twelve) hours. Remove & Discard patch within 12 hours or as directed by MD  Dispense: 180 patch; Refill: 3  Quadriceps weakness Assessment & Plan: Agree with orthopedist and encouraged pt to pursue physical therapy recommendation. Pt states will call to schedule.   Orders: -     Magnesium -     Basic metabolic panel -     CK -     Lidocaine; Place 1 patch onto the skin every 12 (twelve) hours. Remove & Discard patch within 12 hours or as directed by MD   Dispense: 180 patch; Refill: 3     Follow up plan: Return in about 3 months (around 11/17/2022).  Mort Sawyers, FNP

## 2022-08-17 NOTE — Assessment & Plan Note (Signed)
Ordering ck mag and potassium pending results.  Advised to continue good amount of water intake daily as well.  Avoid dehydration as able.

## 2022-08-17 NOTE — Assessment & Plan Note (Signed)
Agree with orthopedist and encouraged pt to pursue physical therapy recommendation. Pt states will call to schedule.

## 2022-08-18 ENCOUNTER — Encounter: Payer: Self-pay | Admitting: Family

## 2022-08-18 DIAGNOSIS — J330 Polyp of nasal cavity: Secondary | ICD-10-CM | POA: Diagnosis not present

## 2022-08-18 DIAGNOSIS — J328 Other chronic sinusitis: Secondary | ICD-10-CM | POA: Diagnosis not present

## 2022-08-20 ENCOUNTER — Telehealth: Payer: Self-pay | Admitting: Pharmacist

## 2022-08-20 NOTE — Progress Notes (Signed)
Contacted patient regarding upcoming appointment with [facility] pharmacist. Per clinical review, no pharmacist appointment needed at this time. Appointment canceled and voicemail left notifying patient. Patient can re-engage with pharmacy team with future medication needs.   Catie Eppie Gibson, PharmD, BCACP, CPP Clinical Pharmacist [facility] [phone]

## 2022-08-23 NOTE — Progress Notes (Unsigned)
  Cardiology Office Note:   Date:  08/25/2022  ID:  Kyle Simon, DOB August 02, 1932, MRN [id number] PCP: Mort Sawyers, FNP  [facility] Providers Cardiologist:  Rollene Rotunda, MD {  History of Present Illness:   Kyle Simon is a 89 y.o. male who presents for follow up with SOB and abnormal EKG.   He had a low risk stress echo in 2012.  He had a distant cardiac cath.   He had dyspnea at a previous visit and I sent him for a [employer].  This was negative for ischemia.    Since I last saw him he was with a fall.  I reviewed these records.  There was no syncope.  It seems that this was witnessed.   The patient denies any new symptoms such as chest discomfort, neck or arm discomfort. There has been no new shortness of breath, PND or orthopnea. There have been no reported palpitations, presyncope or syncope.    ROS: As stated in the HPI and negative for all other systems.  Studies Reviewed:    EKG:   EKG Interpretation  Date/Time:  Wednesday August 25 2022 09:38:41 EDT Ventricular Rate:  69 PR Interval:  180 QRS Duration: 88 QT Interval:  392 QTC Calculation: 420 R Axis:   57 Text Interpretation: Sinus rhythm with Premature atrial complexes When compared with ECG of 06-Sep-2020 11:52, No significant change since Confirmed by Rollene Rotunda ([id number]) on 08/25/2022 9:52:25 AM    Risk Assessment/Calculations:     Physical Exam:   VS:  BP 134/80 (BP Location: Right Arm, Patient Position: Sitting, Cuff Size: Normal)   Pulse 69   Ht 5\' 10"  (1.778 m)   Wt 180 lb 3.2 oz (81.7 kg)   BMI 25.86 kg/m    Wt Readings from Last 3 Encounters:  08/25/22 180 lb 3.2 oz (81.7 kg)  08/17/22 183 lb (83 kg)  07/13/22 185 lb 3.2 oz (84 kg)     GEN: Well nourished, well developed in no acute distress NECK: No JVD; No carotid bruits CARDIAC: RRR, no murmurs, rubs, gallops RESPIRATORY:  Clear to auscultation without rales, wheezing or rhonchi  ABDOMEN: Soft, non-tender,  non-distended EXTREMITIES:  No edema; No deformity   ASSESSMENT AND PLAN:   SOB:     This is baseline.  No change in therapy or further work up.    HTN:  The blood pressure is at target.  No change in therapy.    AORTIC ATHEROSCLEROSIS/ENLARGEMENT:   His aorta was 38 mm .   No further imaging at this time.   DYSLIPIDEMIA:    His LDL was  was 58.  Continue meds as listed.        Follow up me as needed.    Signed, Rollene Rotunda, MD

## 2022-08-25 ENCOUNTER — Encounter: Payer: Self-pay | Admitting: Cardiology

## 2022-08-25 ENCOUNTER — Ambulatory Visit: Payer: Medicare Other | Attending: Cardiology | Admitting: Cardiology

## 2022-08-25 VITALS — BP 134/80 | HR 69 | Ht 70.0 in | Wt 180.2 lb

## 2022-08-25 DIAGNOSIS — I10 Essential (primary) hypertension: Secondary | ICD-10-CM | POA: Insufficient documentation

## 2022-08-25 DIAGNOSIS — E785 Hyperlipidemia, unspecified: Secondary | ICD-10-CM | POA: Insufficient documentation

## 2022-08-25 DIAGNOSIS — I700 Atherosclerosis of aorta: Secondary | ICD-10-CM | POA: Diagnosis not present

## 2022-08-25 DIAGNOSIS — E118 Type 2 diabetes mellitus with unspecified complications: Secondary | ICD-10-CM | POA: Diagnosis not present

## 2022-08-25 DIAGNOSIS — R0602 Shortness of breath: Secondary | ICD-10-CM | POA: Diagnosis not present

## 2022-08-25 NOTE — Patient Instructions (Signed)
Medication Instructions:  Your physician recommends that you continue on your current medications as directed. Please refer to the Current Medication list given to you today. *If you need a refill on your cardiac medications before your next appointment, please call your pharmacy*   Lab Work: None ordered If you have labs (blood work) drawn today and your tests are completely normal, you will receive your results only by: MyChart Message (if you have MyChart) OR A paper copy in the mail If you have any lab test that is abnormal or we need to change your treatment, we will call you to review the results.   Testing/Procedures: None ordered   Follow-Up: At [facility], you and your health needs are our priority.  As part of our continuing mission to provide you with exceptional heart care, we have created designated Provider Care Teams.  These Care Teams include your primary Cardiologist (physician) and Advanced Practice Providers (APPs -  Physician Assistants and Nurse Practitioners) who all work together to provide you with the care you need, when you need it.  We recommend signing up for the patient portal called "MyChart".  Sign up information is provided on this After Visit Summary.  MyChart is used to connect with patients for Virtual Visits (Telemedicine).  Patients are able to view lab/test results, encounter notes, upcoming appointments, etc.  Non-urgent messages can be sent to your provider as well.   To learn more about what you can do with MyChart, go to [email or web address].    Your next appointment:   FOLLOW UP AS NEEDED   Provider:   Rollene Rotunda, MD     Other Instructions

## 2022-08-26 ENCOUNTER — Encounter: Payer: Medicare Other | Admitting: Pharmacist

## 2022-09-07 ENCOUNTER — Other Ambulatory Visit: Payer: Self-pay | Admitting: Family

## 2022-09-07 DIAGNOSIS — E1159 Type 2 diabetes mellitus with other circulatory complications: Secondary | ICD-10-CM

## 2022-09-07 NOTE — Telephone Encounter (Signed)
Prescription Request  09/07/2022  LOV: 08/17/2022  What is the name of the medication or equipment? valsartan (DIOVAN) 320 MG tablet , 90 day supply  Have you contacted your pharmacy to request a refill? Yes   Which pharmacy would you like this sent to?  [facility] - [place], [place] - [address] [place] [address] Phone: [phone] Fax: [phone]     Patient notified that their request is being sent to the clinical staff for review and that they should receive a response within 2 business days.   Please advise at Mobile [phone] (mobile)

## 2022-09-09 ENCOUNTER — Ambulatory Visit (INDEPENDENT_AMBULATORY_CARE_PROVIDER_SITE_OTHER): Payer: Medicare Other

## 2022-09-09 ENCOUNTER — Telehealth: Payer: Self-pay

## 2022-09-09 VITALS — Ht 70.0 in | Wt 180.0 lb

## 2022-09-09 DIAGNOSIS — Z961 Presence of intraocular lens: Secondary | ICD-10-CM | POA: Diagnosis not present

## 2022-09-09 DIAGNOSIS — Z0000 Encounter for general adult medical examination without abnormal findings: Secondary | ICD-10-CM

## 2022-09-09 DIAGNOSIS — H5021 Vertical strabismus, right eye: Secondary | ICD-10-CM | POA: Diagnosis not present

## 2022-09-09 DIAGNOSIS — H532 Diplopia: Secondary | ICD-10-CM | POA: Diagnosis not present

## 2022-09-09 DIAGNOSIS — E1169 Type 2 diabetes mellitus with other specified complication: Secondary | ICD-10-CM

## 2022-09-09 MED ORDER — ROSUVASTATIN CALCIUM 5 MG PO TABS: 5.0000 mg | ORAL_TABLET | Freq: Every day | ORAL | 3 refills | Status: DC

## 2022-09-09 NOTE — Progress Notes (Signed)
Subjective:   Kyle Simon is a 89 y.o. male who presents for Medicare Annual/Subsequent preventive examination.  Visit Complete: Virtual  I connected with  Kyle Simon on 09/09/22 by a audio enabled telemedicine application and verified that I am speaking with the correct person using two identifiers.  Patient Location: Home  Provider Location: Office/Clinic  I discussed the limitations of evaluation and management by telemedicine. The patient expressed understanding and agreed to proceed.   Review of Systems      Cardiac Risk Factors include: advanced age (>65men, >5 women);hypertension;male gender;dyslipidemia;sedentary lifestyle     Objective:    Today's Vitals   09/09/22 0849  Weight: 180 lb (81.6 kg)  Height: 5\' 10"  (1.778 m)   Body mass index is 25.83 kg/m.     09/09/2022    9:01 AM 05/06/2022    8:40 AM 09/07/2021    8:56 AM 09/06/2020    1:43 PM 09/06/2020    4:31 AM 09/04/2020    8:30 AM 11/03/2017    6:44 AM  Advanced Directives  Does Patient Have a Medical Advance Directive? Yes Yes No Yes Yes Yes Yes  Type of Estate agent of [place];Living will   Healthcare Power of [place];Living will;Out of facility DNR (pink MOST or yellow form) Healthcare Power of [place];Living will;Out of facility DNR (pink MOST or yellow form) Healthcare Power of [place];Living will   Does patient want to make changes to medical advance directive?    No - Patient declined     Copy of Healthcare Power of Attorney in Chart? No - copy requested   No - copy requested  No - copy requested   Would patient like information on creating a medical advance directive?   No - Patient declined        Current Medications (verified) Outpatient Encounter Medications as of 09/09/2022  Medication Sig   ammonium lactate (AMLACTIN DAILY) 12 % lotion Apply 1 Application topically as needed for dry skin.   celecoxib (CELEBREX) 200 MG capsule Take 200 mg by mouth 2 (two) times  daily.   diclofenac Sodium (VOLTAREN) 1 % GEL Apply 2 g topically at bedtime.   finasteride (PROSCAR) 5 MG tablet Take 5 mg by mouth daily.   fludrocortisone (FLORINEF) 0.1 MG tablet 1 tablet 3 days a week   fluticasone (FLONASE) 50 MCG/ACT nasal spray Place 2 sprays into both nostrils daily.   Hypromellose (ARTIFICIAL TEARS OP) Place 1 drop into both eyes 2 (two) times daily.   lidocaine (LIDODERM) 5 % Place 1 patch onto the skin every 12 (twelve) hours. Remove & Discard patch within 12 hours or as directed by MD   lipase/protease/amylase (CREON) 36000 UNITS CPEP capsule TAKE 1 CAPSULE WITH THE FIRST BITE OF EACH MEAL AND 1 CAPSULE WITH THE FIRST BITE OF EACH SNACK   Omega-3 Fatty Acids (FISH OIL) 1000 MG CAPS Take 1,000 mg by mouth every evening.   polyethylene glycol (MIRALAX) 17 g packet Take 17 g by mouth daily as needed for mild constipation or moderate constipation.   rosuvastatin (CRESTOR) 5 MG tablet Take 1 tablet (5 mg total) by mouth at bedtime.   SYNTHROID 137 MCG tablet TAKE 1 TABLET DAILY BEFORE BREAKFAST   tamsulosin (FLOMAX) 0.4 MG CAPS capsule Take 1 capsule (0.4 mg total) by mouth 2 (two) times daily.   valsartan (DIOVAN) 320 MG tablet Take 1 tablet (320 mg total) by mouth daily.   amoxicillin-clavulanate (AUGMENTIN) 875-125 MG tablet Take 1 tablet  by mouth 2 (two) times daily. (Patient not taking: Reported on 09/09/2022)   aspirin 81 MG tablet Take 81 mg by mouth every evening.  (Patient not taking: Reported on 08/17/2022)   CLOBETASOL PROPIONATE E 0.05 % emollient cream as needed. (Patient not taking: Reported on 09/09/2022)   mometasone (ELOCON) 0.1 % ointment Apply topically daily. (Patient not taking: Reported on 09/09/2022)   predniSONE (STERAPRED UNI-PAK 48 TAB) 5 MG (48) TBPK tablet Take 5 mg by mouth. (Patient not taking: Reported on 09/09/2022)   No facility-administered encounter medications on file as of 09/09/2022.    Allergies (verified) Simvastatin and Oxycodone    History: Past Medical History:  Diagnosis Date   Arthritis    both hands;Dr.Deveshwar   Constipation due to pain medication    Diabetes mellitus without complication (HCC)    Type II. Uses no medication. Pt controls with diet and weight   Enlarged prostate    Frequent urination at night    GERD (gastroesophageal reflux disease)    History of noncompliance with medical treatment, presenting hazards to health 11/20/2018   Hyperlipidemia associated with type 2 diabetes mellitus (HCC) 04/19/2007   Qualifier: Diagnosis of  By: Alwyn Ren MD, William     Hypertension    Hypothyroidism    Polio    as a child w/o complications   Snoring    no sleep apnea   Statin declined-  11/20/2018   patient understands risks associated with this decision and has been advised to restart by cardiologist as well as myself several times   Torn meniscus    Transfusion history 2012   post TKR   Past Surgical History:  Procedure Laterality Date   ANTERIOR LAT LUMBAR FUSION Right 11/03/2017   Procedure: Right Lumbar three-four Anterolateral lumbar interbody fusion with lateral plate;  Surgeon: Maeola Harman, MD;  [facility];  Service: Neurosurgery;  Laterality: Right;   BACK SURGERY  2011   hemiarthroplasty 01/11/1999 and 6   CARDIAC CATHETERIZATION  09/2001   negative   COLONOSCOPY     COLONOSCOPY N/A 05/06/2022   Procedure: COLONOSCOPY;  Surgeon: Toney Reil, MD;  Location: [facility] ENDOSCOPY;  Service: Gastroenterology;  Laterality: N/A;   COLONOSCOPY WITH PROPOFOL N/A 05/05/2022   Procedure: COLONOSCOPY WITH PROPOFOL;  Surgeon: Toney Reil, MD;  Location[facility] ENDOSCOPY;  Service: Gastroenterology;  Laterality: N/A;   EYE SURGERY  2003   R&L cataract removed & IOL- 2003 & 2007,Dr.Jenkins   INGUINAL HERNIA REPAIR  2002   right, Dr. Luan Pulling   JOINT REPLACEMENT  2012   left knee replacement   LIPOMA EXCISION     back   MENISCECTOMY Bilateral    Dr. Thurston Hole   PROSTATE SURGERY  07/2006    for urinary urgency, Dr. Tiana Loft CUFF REPAIR Right October, 2016   sinus & throat surgery-1995  1995   TONSILLECTOMY     TOTAL KNEE ARTHROPLASTY  01/11/2011   Procedure: TOTAL KNEE ARTHROPLASTY;  Surgeon: Raymon Mutton, MD;  Location: [facility] OR;  Service: Orthopedics;  Laterality: Left;   TOTAL KNEE ARTHROPLASTY Right 11/22/2016   Procedure: TOTAL KNEE ARTHROPLASTY;  Surgeon: Dannielle Huh, MD;  Location: [facility] OR;  Service: Orthopedics;  Laterality: Right;   UVULECTOMY  1996   Dr. Lazarus Salines   VASECTOMY     WRIST SURGERY  1959   fracture- right   XI ROBOTIC ASSISTED VENTRAL HERNIA N/A 09/07/2020   Procedure: XI ROBOTIC ASSISTED DIAGNOSTIC LAPAROSCOPY;  Surgeon: Leafy Ro,  MD;  Location: [facility] ORS;  Service: General;  Laterality: N/A;   Family History  Problem Relation Age of Onset   Coronary artery disease Mother    Emphysema Mother    Diverticulosis Mother    Thyroid disease Mother    Sudden death Father        accident   Asthma Brother    Bone cancer Brother    Thyroid disease Daughter    Sarcoidosis Son    Diabetes Paternal Grandmother    Anesthesia problems Neg Hx    Hypotension Neg Hx    Malignant hyperthermia Neg Hx    Pseudochol deficiency Neg Hx    Colon cancer Neg Hx    Social History   Socioeconomic History   Marital status: Married    Spouse name: June   Number of children: 7   Years of education: 2 years college   Highest education level: Not on file  Occupational History   Occupation: retired  Tobacco Use   Smoking status: Former    Current packs/day: 0.00    Average packs/day: 1 pack/day for 18.0 years (18.0 ttl pk-yrs)    Types: Cigarettes    Start date: 03/01/1948    Quit date: 03/01/1966    Years since quitting: 56.5    Passive exposure: Never   Smokeless tobacco: Never  Vaping Use   Vaping status: Never Used  Substance and Sexual Activity   Alcohol use: Yes    Alcohol/week: 4.0 standard drinks of alcohol    Types: 4 Shots of liquor per  week    Comment: social, few drinks a week   Drug use: No   Sexual activity: Yes    Birth control/protection: Post-menopausal, Surgical  Other Topics Concern   Not on file  Social History Narrative   09/20/19   From: [place]   Living: with wife June (1985)   Work: retired from Company secretary, 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      Family: 5 children (one passed away) 2 stepchildren, 14 grandchildren, 9 great-grandchildren - Museum/gallery conservator and granddaughter nearby otherwise across the country       Enjoys: fly and play golf, reading      Exercise: walking the dog   Diet: diabetic diet      Safety   Seat belts: Yes    Guns: Yes  and secure   Safe in relationships: Yes    Social Determinants of Corporate investment banker Strain: Low Risk  (09/09/2022)   Overall Financial Resource Strain (CARDIA)    Difficulty of Paying Living Expenses: Not hard at all  Food Insecurity: No Food Insecurity (09/09/2022)   Hunger Vital Sign    Worried About Running Out of Food in the Last Year: Never true    Ran Out of Food in the Last Year: Never true  Transportation Needs: No Transportation Needs (09/09/2022)   PRAPARE - Administrator, Civil Service (Medical): No    Lack of Transportation (Non-Medical): No  Physical Activity: Sufficiently Active (09/09/2022)   Exercise Vital Sign    Days of Exercise per Week: 3 days    Minutes of Exercise per Session: 60 min  Stress: No Stress Concern Present (09/09/2022)   [employer] of Occupational Health - Occupational Stress Questionnaire    Feeling of Stress : Not at all  Social Connections: Socially Integrated (09/09/2022)   Social Connection and Isolation Panel [NHANES]    Frequency of Communication with Friends and Family:  More than three times a week    Frequency of Social Gatherings with Friends and Family: More than three times a week    Attends Religious Services: More than 4 times per year    Active Member  of [employer] or Organizations: Yes    Attends Banker Meetings: Never    Marital Status: Married    Tobacco Counseling Counseling given: Not Answered   Clinical Intake:  Pre-visit preparation completed: Yes  Pain : No/denies pain     BMI - recorded: 25.83 Nutritional Status: BMI 25 -29 Overweight Nutritional Risks: None Diabetes: Yes CBG done?: Yes (112 this morning per pt) CBG resulted in Enter/ Edit results?: No Did pt. bring in CBG monitor from home?: No  How often do you need to have someone help you when you read instructions, pamphlets, or other written materials from your doctor or pharmacy?: 1 - Never  Interpreter Needed?: No  Information entered by :: C.Indyah Saulnier LPN   Activities of Daily Living    09/09/2022    9:02 AM  In your present state of health, do you have any difficulty performing the following activities:  Hearing? 0  Vision? 0  Difficulty concentrating or making decisions? 0  Walking or climbing stairs? 1  Comment Knee issues  Dressing or bathing? 0  Doing errands, shopping? 0  Preparing Food and eating ? N  Using the Toilet? N  In the past six months, have you accidently leaked urine? N  Do you have problems with loss of bowel control? N  Managing your Medications? N  Managing your Finances? N  Housekeeping or managing your Housekeeping? N    Patient Care Team: Mort Sawyers, FNP as PCP - General (Family Medicine) Rollene Rotunda, MD as PCP - Cardiology (Cardiology) Pollyann Savoy, MD as Consulting Physician (Rheumatology) Reather Littler, MD as Consulting Physician (Endocrinology) Sherrie George, MD as Consulting Physician (Ophthalmology) Rene Paci, MD as Consulting Physician (Urology) [facility]  Indicate any recent Medical Services you may have received from other than [facility] providers in the past year (date may be approximate).     Assessment:   This is a routine wellness examination  for Kyle Simon.  Hearing/Vision screen Hearing Screening - Comments:: No hearing issues Vision Screening - Comments:: No vision issues - Dr.Groat/ Dr.Matthews- UTD on eye exams  Dietary issues and exercise activities discussed:     Goals Addressed             This Visit's Progress    Patient Stated       Be more active and build strength in legs.       Depression Screen    09/09/2022    8:58 AM 08/17/2022    8:09 AM 07/08/2022   12:00 PM 07/01/2022   10:21 AM 04/02/2022    4:14 PM 09/07/2021    8:54 AM 09/04/2020    8:31 AM  PHQ 2/9 Scores  PHQ - 2 Score 0 1  0 1 0 0  PHQ- 9 Score 0 6   3 0 0  Exception Documentation   Patient refusal        Fall Risk    09/09/2022    8:52 AM 08/17/2022    8:11 AM 07/01/2022   10:21 AM 04/02/2022    4:13 PM 09/07/2021    8:57 AM  Fall Risk   Falls in the past year? 1 1 0 1 0  Number falls in past yr: 1 1 0 0  0  Comment lost balance on step      Injury with Fall? 1 1 0 0 0  Comment Hit head      Risk for fall due to : Impaired balance/gait;Impaired mobility History of fall(s)  Other (Comment) No Fall Risks  Follow up Falls evaluation completed;Education provided;Falls prevention discussed Falls evaluation completed Falls evaluation completed;Education provided;Falls prevention discussed Falls evaluation completed Falls evaluation completed    MEDICARE RISK AT HOME:  Medicare Risk at Home - 09/09/22 0903     Any stairs in or around the home? Yes    If so, are there any without handrails? No    Home free of loose throw rugs in walkways, pet beds, electrical cords, etc? Yes    Adequate lighting in your home to reduce risk of falls? Yes    Life alert? Yes    Use of a cane, walker or w/c? Yes    Grab bars in the bathroom? Yes    Shower chair or bench in shower? Yes    Elevated toilet seat or a handicapped toilet? Yes             TIMED UP AND GO:  Was the test performed?  No    Cognitive Function:    09/04/2020    8:46 AM  MMSE -  Mini Mental State Exam  Orientation to time 5  Orientation to Place 5  Registration 3  Attention/ Calculation 5  Recall 3  Language- repeat 1        09/09/2022    9:03 AM 11/26/2021   10:17 AM 07/04/2018    9:45 AM 05/18/2017    1:35 PM  6CIT Screen  What Year? 0 points 0 points 0 points 0 points  What month? 0 points 0 points 0 points 0 points  What time? 0 points 0 points 0 points 0 points  Count back from 20 0 points 0 points 0 points 0 points  Months in reverse 0 points 0 points 0 points 0 points  Repeat phrase 0 points 4 points 0 points 0 points  Total Score 0 points 4 points 0 points 0 points    Immunizations Immunization History  Administered Date(s) Administered   Fluad Quad(high Dose 65+) 12/05/2019   Influenza Whole 01/09/2002   Influenza, High Dose Seasonal PF 11/24/2018, 02/08/2019   Influenza,inj,Quad PF,6+ Mos 11/29/2012, 11/29/2013   Influenza-Unspecified 11/30/2014, 11/22/2015, 01/12/2018, 11/27/2021   MMR 07/12/2017   PFIZER(Purple Top)SARS-COV-2 Vaccination 03/11/2019, 04/01/2019, 11/07/2019, 06/18/2020   Pfizer Covid-19 Vaccine Bivalent Booster 6yrs & up 12/31/2020   Pneumococcal Conjugate-13 03/20/2014   Pneumococcal Polysaccharide-23 03/30/1996, 02/13/2010   Respiratory Syncytial Virus Vaccine,Recomb Aduvanted(Arexvy) 12/15/2021   Td 03/30/1996, 04/26/2007   Td (Adult) 03/30/1996, 04/26/2007   Tdap 07/24/2018   Unspecified SARS-COV-2 Vaccination 11/27/2021   Zoster Recombinant(Shingrix) 07/24/2018, 11/24/2018, 02/08/2019    TDAP status: Up to date  Flu Vaccine status: Up to date  Pneumococcal vaccine status: Up to date  Covid-19 vaccine status: Information provided on how to obtain vaccines.   Qualifies for Shingles Vaccine? Yes   Zostavax completed No   Shingrix Completed?: Yes  Screening Tests Health Maintenance  Topic Date Due   FOOT EXAM  06/25/2022   Medicare Annual Wellness (AWV)  09/08/2022   COVID-19 Vaccine (7 - 2023-24 season)  07/08/2023 (Originally 01/22/2022)   HEMOGLOBIN A1C  09/23/2022   INFLUENZA VACCINE  09/30/2022   OPHTHALMOLOGY EXAM  07/29/2023   DTaP/Tdap/Td (6 - Td or Tdap) 07/23/2028   Pneumonia  Vaccine 53+ Years old  Completed   Zoster Vaccines- Shingrix  Completed   HPV VACCINES  Aged Out   Colonoscopy  Discontinued    Health Maintenance  Health Maintenance Due  Topic Date Due   FOOT EXAM  06/25/2022   Medicare Annual Wellness (AWV)  09/08/2022    Colorectal cancer screening: No longer required.   Lung Cancer Screening: (Low Dose CT Chest recommended if Age 15-80 years, 20 pack-year currently smoking OR have quit w/in 15years.) does not qualify.   Lung Cancer Screening Referral: n/a  Additional Screening:  Hepatitis C Screening: does not qualify; Completed n/a  Vision Screening: Recommended annual ophthalmology exams for early detection of glaucoma and other disorders of the eye. Is the patient up to date with their annual eye exam?  Yes  Who is the provider or what is the name of the office in which the patient attends annual eye exams? Dr.Groat and Dr.Matthews If pt is not established with a provider, would they like to be referred to a provider to establish care? Yes .   Dental Screening: Recommended annual dental exams for proper oral hygiene  Diabetic Foot Exam: Diabetic Foot Exam: Completed 06/24/21  Community Resource Referral / Chronic Care Management: CRR required this visit?  No   CCM required this visit?  No     Plan:     I have personally reviewed and noted the following in the patient's chart:   Medical and social history Use of alcohol, tobacco or illicit drugs  Current medications and supplements including opioid prescriptions. Patient is not currently taking opioid prescriptions. Functional ability and status Nutritional status Physical activity Advanced directives List of other physicians Hospitalizations, surgeries, and ER visits in previous 12  months Vitals Screenings to include cognitive, depression, and falls Referrals and appointments  In addition, I have reviewed and discussed with patient certain preventive protocols, quality metrics, and best practice recommendations. A written personalized care plan for preventive services as well as general preventive health recommendations were provided to patient.     Maryan Puls, LPN   1/61/0960   After Visit Summary: (MyChart) Due to this being a telephonic visit, the after visit summary with patients personalized plan was offered to patient via MyChart   Nurse Notes: none

## 2022-09-09 NOTE — Telephone Encounter (Signed)
Left message to return call to our office.  

## 2022-09-09 NOTE — Patient Instructions (Signed)
Kyle Simon , Thank you for taking time to come for your Medicare Wellness Visit. I appreciate your ongoing commitment to your health goals. Please review the following plan we discussed and let me know if I can assist you in the future.   These are the goals we discussed:  Goals      DIET - EAT MORE FRUITS AND VEGETABLES     Patient Stated     09/04/2020, I will continue to walk 3-4 days a week for about 20 minutes.      Patient Stated     Be more active and build strength in legs.        This is a list of the screening recommended for you and due dates:  Health Maintenance  Topic Date Due   Complete foot exam   06/25/2022   Medicare Annual Wellness Visit  09/08/2022   COVID-19 Vaccine (7 - 2023-24 season) 07/08/2023*   Hemoglobin A1C  09/23/2022   Flu Shot  09/30/2022   Eye exam for diabetics  07/29/2023   DTaP/Tdap/Td vaccine (6 - Td or Tdap) 07/23/2028   Pneumonia Vaccine  Completed   Zoster (Shingles) Vaccine  Completed   HPV Vaccine  Aged Out   Colon Cancer Screening  Discontinued  *Topic was postponed. The date shown is not the original due date.    Advanced directives: Please bring a copy of your health care power of attorney and living will to the office to be added to your chart at your convenience.   Conditions/risks identified: Aim for 30 minutes of exercise or brisk walking, 6-8 glasses of water, and 5 servings of fruits and vegetables each day.   Next appointment: Follow up in one year for your annual wellness visit. 09/13/23 @ 8:45 telephone call  Preventive Care 65 Years and Older, Male  Preventive care refers to lifestyle choices and visits with your health care provider that can promote health and wellness. What does preventive care include? A yearly physical exam. This is also called an annual well check. Dental exams once or twice a year. Routine eye exams. Ask your health care provider how often you should have your eyes checked. Personal lifestyle  choices, including: Daily care of your teeth and gums. Regular physical activity. Eating a healthy diet. Avoiding tobacco and drug use. Limiting alcohol use. Practicing safe sex. Taking low doses of aspirin every day. Taking vitamin and mineral supplements as recommended by your health care provider. What happens during an annual well check? The services and screenings done by your health care provider during your annual well check will depend on your age, overall health, lifestyle risk factors, and family history of disease. Counseling  Your health care provider may ask you questions about your: Alcohol use. Tobacco use. Drug use. Emotional well-being. Home and relationship well-being. Sexual activity. Eating habits. History of falls. Memory and ability to understand (cognition). Work and work Astronomer. Screening  You may have the following tests or measurements: Height, weight, and BMI. Blood pressure. Lipid and cholesterol levels. These may be checked every 5 years, or more frequently if you are over 56 years old. Skin check. Lung cancer screening. You may have this screening every year starting at age 38 if you have a 30-pack-year history of smoking and currently smoke or have quit within the past 15 years. Fecal occult blood test (FOBT) of the stool. You may have this test every year starting at age 27. Flexible sigmoidoscopy or colonoscopy. You may have a  sigmoidoscopy every 5 years or a colonoscopy every 10 years starting at age 39. Prostate cancer screening. Recommendations will vary depending on your family history and other risks. Hepatitis C blood test. Hepatitis B blood test. Sexually transmitted disease (STD) testing. Diabetes screening. This is done by checking your blood sugar (glucose) after you have not eaten for a while (fasting). You may have this done every 1-3 years. Abdominal aortic aneurysm (AAA) screening. You may need this if you are a current or  former smoker. Osteoporosis. You may be screened starting at age 55 if you are at high risk. Talk with your health care provider about your test results, treatment options, and if necessary, the need for more tests. Vaccines  Your health care provider may recommend certain vaccines, such as: Influenza vaccine. This is recommended every year. Tetanus, diphtheria, and acellular pertussis (Tdap, Td) vaccine. You may need a Td booster every 10 years. Zoster vaccine. You may need this after age 76. Pneumococcal 13-valent conjugate (PCV13) vaccine. One dose is recommended after age 17. Pneumococcal polysaccharide (PPSV23) vaccine. One dose is recommended after age 70. Talk to your health care provider about which screenings and vaccines you need and how often you need them. This information is not intended to replace advice given to you by your health care provider. Make sure you discuss any questions you have with your health care provider. Document Released: 03/14/2015 Document Revised: 11/05/2015 Document Reviewed: 12/17/2014 Elsevier Interactive Patient Education  2017 [employer].  Fall Prevention in the Home Falls can cause injuries. They can happen to people of all ages. There are many things you can do to make your home safe and to help prevent falls. What can I do on the outside of my home? Regularly fix the edges of walkways and driveways and fix any cracks. Remove anything that might make you trip as you walk through a door, such as a raised step or threshold. Trim any bushes or trees on the path to your home. Use bright outdoor lighting. Clear any walking paths of anything that might make someone trip, such as rocks or tools. Regularly check to see if handrails are loose or broken. Make sure that both sides of any steps have handrails. Any raised decks and porches should have guardrails on the edges. Have any leaves, snow, or ice cleared regularly. Use sand or salt on walking paths  during winter. Clean up any spills in your garage right away. This includes oil or grease spills. What can I do in the bathroom? Use night lights. Install grab bars by the toilet and in the tub and shower. Do not use towel bars as grab bars. Use non-skid mats or decals in the tub or shower. If you need to sit down in the shower, use a plastic, non-slip stool. Keep the floor dry. Clean up any water that spills on the floor as soon as it happens. Remove soap buildup in the tub or shower regularly. Attach bath mats securely with double-sided non-slip rug tape. Do not have throw rugs and other things on the floor that can make you trip. What can I do in the bedroom? Use night lights. Make sure that you have a light by your bed that is easy to reach. Do not use any sheets or blankets that are too big for your bed. They should not hang down onto the floor. Have a firm chair that has side arms. You can use this for support while you get dressed. Do not  have throw rugs and other things on the floor that can make you trip. What can I do in the kitchen? Clean up any spills right away. Avoid walking on wet floors. Keep items that you use a lot in easy-to-reach places. If you need to reach something above you, use a strong step stool that has a grab bar. Keep electrical cords out of the way. Do not use floor polish or wax that makes floors slippery. If you must use wax, use non-skid floor wax. Do not have throw rugs and other things on the floor that can make you trip. What can I do with my stairs? Do not leave any items on the stairs. Make sure that there are handrails on both sides of the stairs and use them. Fix handrails that are broken or loose. Make sure that handrails are as long as the stairways. Check any carpeting to make sure that it is firmly attached to the stairs. Fix any carpet that is loose or worn. Avoid having throw rugs at the top or bottom of the stairs. If you do have throw  rugs, attach them to the floor with carpet tape. Make sure that you have a light switch at the top of the stairs and the bottom of the stairs. If you do not have them, ask someone to add them for you. What else can I do to help prevent falls? Wear shoes that: Do not have high heels. Have rubber bottoms. Are comfortable and fit you well. Are closed at the toe. Do not wear sandals. If you use a stepladder: Make sure that it is fully opened. Do not climb a closed stepladder. Make sure that both sides of the stepladder are locked into place. Ask someone to hold it for you, if possible. Clearly mark and make sure that you can see: Any grab bars or handrails. First and last steps. Where the edge of each step is. Use tools that help you move around (mobility aids) if they are needed. These include: Canes. Walkers. Scooters. Crutches. Turn on the lights when you go into a dark area. Replace any light bulbs as soon as they burn out. Set up your furniture so you have a clear path. Avoid moving your furniture around. If any of your floors are uneven, fix them. If there are any pets around you, be aware of where they are. Review your medicines with your doctor. Some medicines can make you feel dizzy. This can increase your chance of falling. Ask your doctor what other things that you can do to help prevent falls. This information is not intended to replace advice given to you by your health care provider. Make sure you discuss any questions you have with your health care provider. Document Released: 12/12/2008 Document Revised: 07/24/2015 Document Reviewed: 03/22/2014 Elsevier Interactive Patient Education  2017 [employer].

## 2022-09-09 NOTE — Telephone Encounter (Signed)
Refill was already sent in in May for one year supply.

## 2022-09-09 NOTE — Telephone Encounter (Signed)
LAST APPOINTMENT DATE: 08/17/22   NEXT APPOINTMENT DATE: 12/08/2022  Rosuvastatin 5MG   LAST REFILL: 07/31/21  QTY: #[phone] [id number]

## 2022-09-13 MED ORDER — VALSARTAN 320 MG PO TABS: 320.0000 mg | ORAL_TABLET | Freq: Every day | ORAL | 3 refills | Status: DC

## 2022-09-13 NOTE — Addendum Note (Signed)
Addended by: Benedict Needy on: 09/13/2022 08:32 AM   Modules accepted: Orders

## 2022-09-13 NOTE — Telephone Encounter (Signed)
Pt would like medication sent to [facility].  It was originally sent to [facility].  Have pended refill to the new pharmacy.

## 2022-09-15 ENCOUNTER — Telehealth: Payer: Self-pay | Admitting: Gastroenterology

## 2022-09-15 MED ORDER — PANCRELIPASE (LIP-PROT-AMYL) 36000-114000 UNITS PO CPEP: 36000.0000 [IU] | ORAL_CAPSULE | Freq: Three times a day (TID) | ORAL | 0 refills | Status: DC

## 2022-09-15 NOTE — Telephone Encounter (Signed)
Called the pharmacy and all they need is a new script for the medication. Sent medication to the pharmacy

## 2022-09-15 NOTE — Telephone Encounter (Signed)
Patient called in to request a refill of Cre[facility] because [facility] sent an email stating that they will not refill his medication until they talk to the doctor.

## 2022-09-15 NOTE — Addendum Note (Signed)
Addended by: Radene Knee L on: 09/15/2022 01:22 PM   Modules accepted: Orders

## 2022-09-27 ENCOUNTER — Other Ambulatory Visit (INDEPENDENT_AMBULATORY_CARE_PROVIDER_SITE_OTHER): Payer: Medicare Other

## 2022-09-27 DIAGNOSIS — J328 Other chronic sinusitis: Secondary | ICD-10-CM | POA: Diagnosis not present

## 2022-09-27 DIAGNOSIS — E871 Hypo-osmolality and hyponatremia: Secondary | ICD-10-CM

## 2022-09-27 DIAGNOSIS — E119 Type 2 diabetes mellitus without complications: Secondary | ICD-10-CM

## 2022-09-27 DIAGNOSIS — E063 Autoimmune thyroiditis: Secondary | ICD-10-CM

## 2022-09-27 DIAGNOSIS — J330 Polyp of nasal cavity: Secondary | ICD-10-CM | POA: Diagnosis not present

## 2022-09-27 LAB — BASIC METABOLIC PANEL
BUN: 14 mg/dL (ref 6–23)
CO2: 26 mEq/L (ref 19–32)
Calcium: 9.5 mg/dL (ref 8.4–10.5)
Chloride: 97 mEq/L (ref 96–112)
Creatinine, Ser: 0.82 mg/dL (ref 0.40–1.50)
GFR: 78.94 mL/min (ref >60.00)
Glucose, Bld: 118 mg/dL — ABNORMAL HIGH (ref 70–99)
Potassium: 4.5 mEq/L (ref 3.5–5.1)
Sodium: 131 mEq/L — ABNORMAL LOW (ref 135–145)

## 2022-09-27 LAB — TSH: TSH: 0.79 u[IU]/mL (ref 0.35–5.50)

## 2022-09-28 DIAGNOSIS — R2681 Unsteadiness on feet: Secondary | ICD-10-CM | POA: Diagnosis not present

## 2022-09-28 DIAGNOSIS — M25562 Pain in left knee: Secondary | ICD-10-CM | POA: Diagnosis not present

## 2022-09-29 DIAGNOSIS — R2681 Unsteadiness on feet: Secondary | ICD-10-CM | POA: Diagnosis not present

## 2022-09-29 DIAGNOSIS — M25562 Pain in left knee: Secondary | ICD-10-CM | POA: Diagnosis not present

## 2022-09-30 ENCOUNTER — Encounter: Payer: Self-pay | Admitting: Endocrinology

## 2022-09-30 ENCOUNTER — Ambulatory Visit (INDEPENDENT_AMBULATORY_CARE_PROVIDER_SITE_OTHER): Payer: Medicare Other | Admitting: Endocrinology

## 2022-09-30 VITALS — BP 138/80 | HR 48 | Ht 70.0 in | Wt 187.4 lb

## 2022-09-30 DIAGNOSIS — E871 Hypo-osmolality and hyponatremia: Secondary | ICD-10-CM | POA: Diagnosis not present

## 2022-09-30 DIAGNOSIS — E063 Autoimmune thyroiditis: Secondary | ICD-10-CM | POA: Diagnosis not present

## 2022-09-30 DIAGNOSIS — E119 Type 2 diabetes mellitus without complications: Secondary | ICD-10-CM

## 2022-09-30 LAB — POCT GLYCOSYLATED HEMOGLOBIN (HGB A1C): Hemoglobin A1C: 6.4 % — AB (ref 4.0–5.6)

## 2022-09-30 NOTE — Progress Notes (Signed)
Patient ID: Kyle Simon, male   DOB: June 27, 1932, 89 y.o.   MRN: [id number]            Reason for Appointment: Endocrinology follow-up visit    History of Present Illness:   HYPONATREMIA:  Background history:  Serum sodium has been low since at least 2011 and has been as low as 125 in the past More recently lowest reading 127 He has not had any associated symptoms of decreased appetite, nausea or drowsiness Not on any thiazide diuretics or SSRI drugs Has had negative endocrine work-up for cortisol deficiencies  Initially sodium was significantly better after starting DEMECLOCYCLINE 150 mg daily in 9/21 However this was stopped when his sodium decreased on the treatment down to 126  Recent history:  His urine osmolality was previously high at 247 Urine osmolality has been as high as 551 previously Urine sodium was 130 previously  He has been advised cut back on his fluid intake especially coffee and also large volumes of water which he was drinking for his constipation He is getting only 2 cups of coffee now  With a trial of salt tablets his sodium did not improve, it was still down to 126 in 9/21 Also was having nausea from taking more than 1 regular tablet daily  He was empirically started on FLORINEF in April 2023 because of persistently low sodium, mild increase in potassium and history of relatively high urine sodium  He is now taking fludrocortisone 3 times a week  Sodium is now 131 and stableUsually with low sodium he has no change in appetite, nausea, weakness or somnolence  No swelling of the ankles with Florinef  CT scan of chest did not show any tumor  Lab Results  Component Value Date   NA 131 (L) 09/27/2022   K 4.5 09/27/2022   CL 97 09/27/2022   CO2 26 09/27/2022     Hypothyroidism was first diagnosed  around 2005  At the time of diagnosis patient was not having symptoms of  fatigue, cold sensitivity, difficulty concentrating, dry skin, weight  gain Likely had only done routine testing and was told that he was hypothyroid and started him on unknown dose of levothyroxine  He did not start feeling any different when he started taking levothyroxine initially Also usually does not feel any different with dosage adjustments that have been made  He was referred here because his TSH was tending to be high but also his free T4 was relatively higher in 7/18  Recent history: On his initial consultation since his TSH was still relatively high and he was taking the equivalent of about 142 g of levothyroxine daily he was switched to taking 150 g once daily This has been periodically adjusted  He is now on 137 mcg levothyroxine daily   Feels fairly good overall He is taking his levothyroxine regularly before breakfast every morning TSH is consistently normal   Patient's weight history is as follows:  Wt Readings from Last 3 Encounters:  09/30/22 187 lb 6.4 oz (85 kg)  09/09/22 180 lb (81.6 kg)  08/25/22 180 lb 3.2 oz (81.7 kg)    Thyroid function results have been as follows:  Lab Results  Component Value Date   TSH 0.79 09/27/2022   TSH 1.63 06/28/2022   TSH 0.45 03/25/2022   TSH 3.17 11/09/2021   FREET4 1.31 06/28/2022   FREET4 1.46 03/25/2022   FREET4 1.22 11/09/2021   FREET4 1.60 08/20/2021   T3FREE 3.2 12/28/2016  T3FREE 2.7 10/17/2015    Lab Results  Component Value Date   TSH 0.79 09/27/2022   TSH 1.63 06/28/2022   TSH 0.45 03/25/2022   TSH 3.17 11/09/2021   TSH 0.61 08/20/2021   TSH 0.44 06/15/2021   TSH 0.10 (L) 04/21/2021   TSH 0.05 (L) 02/16/2021   TSH 1.02 10/13/2020     Past Medical History:  Diagnosis Date   Arthritis    both hands;Dr.Deveshwar   Constipation due to pain medication    Diabetes mellitus without complication (HCC)    Type II. Uses no medication. Pt controls with diet and weight   Enlarged prostate    Frequent urination at night    GERD (gastroesophageal reflux disease)     History of noncompliance with medical treatment, presenting hazards to health 11/20/2018   Hyperlipidemia associated with type 2 diabetes mellitus (HCC) 04/19/2007   Qualifier: Diagnosis of  By: Alwyn Ren MD, William     Hypertension    Hypothyroidism    Polio    as a child w/o complications   Snoring    no sleep apnea   Statin declined-  11/20/2018   patient understands risks associated with this decision and has been advised to restart by cardiologist as well as myself several times   Torn meniscus    Transfusion history 2012   post TKR    Past Surgical History:  Procedure Laterality Date   ANTERIOR LAT LUMBAR FUSION Right 11/03/2017   Procedure: Right Lumbar three-four Anterolateral lumbar interbody fusion with lateral plate;  Surgeon: Maeola Harman, MD;  Location: [facility] OR;  Service: Neurosurgery;  Laterality: Right;   BACK SURGERY  2011   hemiarthroplasty 01/11/1999 and 6   CARDIAC CATHETERIZATION  09/2001   negative   COLONOSCOPY     COLONOSCOPY N/A 05/06/2022   Procedure: COLONOSCOPY;  Surgeon: Toney Reil, MD;  Location[facility] ENDOSCOPY;  Service: Gastroenterology;  Laterality: N/A;   COLONOSCOPY WITH PROPOFOL N/A 05/05/2022   Procedure: COLONOSCOPY WITH PROPOFOL;  Surgeon: Toney Reil, MD;  Location: [facility] ENDOSCOPY;  Service: Gastroenterology;  Laterality: N/A;   EYE SURGERY  2003   R&L cataract removed & IOL- 2003 & 2007,Dr.Jenkins   INGUINAL HERNIA REPAIR  2002   right, Dr. Luan Pulling   JOINT REPLACEMENT  2012   left knee replacement   LIPOMA EXCISION     back   MENISCECTOMY Bilateral    Dr. Thurston Hole   PROSTATE SURGERY  07/2006   for urinary urgency, Dr. Tiana Loft CUFF REPAIR Right October, 2016   sinus & throat surgery-1995  1995   TONSILLECTOMY     TOTAL KNEE ARTHROPLASTY  01/11/2011   Procedure: TOTAL KNEE ARTHROPLASTY;  Surgeon: Raymon Mutton, MD;  Location: [facility] OR;  Service: Orthopedics;  Laterality: Left;   TOTAL KNEE ARTHROPLASTY Right 11/22/2016    Procedure: TOTAL KNEE ARTHROPLASTY;  Surgeon: Dannielle Huh, MD;  Location: [facility] OR;  Service: Orthopedics;  Laterality: Right;   UVULECTOMY  1996   Dr. Lazarus Salines   VASECTOMY     WRIST SURGERY  1959   fracture- right   XI ROBOTIC ASSISTED VENTRAL HERNIA N/A 09/07/2020   Procedure: XI ROBOTIC ASSISTED DIAGNOSTIC LAPAROSCOPY;  Surgeon: Leafy Ro, MD;  Location: [facility] ORS;  Service: General;  Laterality: N/A;    Family History  Problem Relation Age of Onset   Coronary artery disease Mother    Emphysema Mother    Diverticulosis Mother    Thyroid disease Mother  Sudden death Father        accident   Asthma Brother    Bone cancer Brother    Thyroid disease Daughter    Sarcoidosis Son    Diabetes Paternal Grandmother    Anesthesia problems Neg Hx    Hypotension Neg Hx    Malignant hyperthermia Neg Hx    Pseudochol deficiency Neg Hx    Colon cancer Neg Hx     Social History:  reports that he quit smoking about 56 years ago. His smoking use included cigarettes. He started smoking about 74 years ago. He has a 18 pack-year smoking history. He has never been exposed to tobacco smoke. He has never used smokeless tobacco. He reports current alcohol use of about 4.0 standard drinks of alcohol per week. He reports that he does not use drugs.  Allergies:  Allergies  Allergen Reactions   Simvastatin Other (See Comments)    MYALGIAS   Oxycodone Other (See Comments)    Severe constipation    Allergies as of 09/30/2022       Reactions   Simvastatin Other (See Comments)   MYALGIAS   Oxycodone Other (See Comments)   Severe constipation        Medication List        Accurate as of September 30, 2022  3:47 PM. If you have any questions, ask your nurse or doctor.          ammonium lactate 12 % lotion Commonly known as: Amlactin Daily Apply 1 Application topically as needed for dry skin.   amoxicillin-clavulanate 875-125 MG tablet Commonly known as: AUGMENTIN Take 1 tablet by  mouth 2 (two) times daily.   ARTIFICIAL TEARS OP Place 1 drop into both eyes 2 (two) times daily.   aspirin 81 MG tablet Take 81 mg by mouth every evening.   celecoxib 200 MG capsule Commonly known as: CELEBREX Take 200 mg by mouth 2 (two) times daily.   Clobetasol Propionate E 0.05 % emollient cream Generic drug: Clobetasol Prop Emollient Base as needed.   diclofenac Sodium 1 % Gel Commonly known as: VOLTAREN Apply 2 g topically at bedtime.   finasteride 5 MG tablet Commonly known as: PROSCAR Take 5 mg by mouth daily.   Fish Oil 1000 MG Caps Take 1,000 mg by mouth every evening.   fludrocortisone 0.1 MG tablet Commonly known as: FLORINEF 1 tablet 3 days a week   fluticasone 50 MCG/ACT nasal spray Commonly known as: FLONASE Place 2 sprays into both nostrils daily.   lidocaine 5 % Commonly known as: Lidoderm Place 1 patch onto the skin every 12 (twelve) hours. Remove & Discard patch within 12 hours or as directed by MD   lipase/protease/amylase 96295 UNITS Cpep capsule Commonly known as: Creon Take 1 capsule (36,000 Units total) by mouth 3 (three) times daily before meals.   mometasone 0.1 % ointment Commonly known as: ELOCON Apply topically daily.   polyethylene glycol 17 g packet Commonly known as: MiraLax Take 17 g by mouth daily as needed for mild constipation or moderate constipation.   predniSONE 5 MG (48) Tbpk tablet Commonly known as: STERAPRED UNI-PAK 48 TAB Take 5 mg by mouth.   rosuvastatin 5 MG tablet Commonly known as: Crestor Take 1 tablet (5 mg total) by mouth at bedtime.   Synthroid 137 MCG tablet Generic drug: levothyroxine TAKE 1 TABLET DAILY BEFORE BREAKFAST   tamsulosin 0.4 MG Caps capsule Commonly known as: FLOMAX Take 1 capsule (0.4 mg total) by mouth  2 (two) times daily.   valsartan 320 MG tablet Commonly known as: DIOVAN Take 1 tablet (320 mg total) by mouth daily.         Review of Systems   Previously on losartan  50 mg for reportedly kidney protection by PCP Losartan was stopped because of low normal blood pressure readings previously   He has a blood pressure monitor at home  Recent home BP: Usually 130-140 systolic, 65 diastolic  Cortisol level has been normal  BP Readings from Last 3 Encounters:  09/30/22 138/80  08/25/22 134/80  08/17/22 136/80     He has history of mild diabetes with A1c as high as 7.1.  Also several years ago he did have blood sugars in the diabetic range  His last A1c was 6.9 and now 6.4  He is using One Touch Verio monitor  Checking blood sugars in the mornings and these are100-105 Not checking after meals His blood sugar was 118 in the lab He is eating 2 donuts in the morning almost daily which he says has been his habit for last 20 years  Has not been treated with oral hypoglycemic drugs  Has limited ability to exercise because of knee pain Has gained weight recently because of being on vacation  Wt Readings from Last 3 Encounters:  09/30/22 187 lb 6.4 oz (85 kg)  09/09/22 180 lb (81.6 kg)  08/25/22 180 lb 3.2 oz (81.7 kg)   Lab Results  Component Value Date   HGBA1C 6.4 (A) 09/30/2022   HGBA1C 6.9 (H) 03/25/2022   HGBA1C 6.9 (H) 11/09/2021   Lab Results  Component Value Date   MICROALBUR <0.7 04/27/2022   LDLCALC 58 11/26/2021   CREATININE 0.82 09/27/2022            Examination:    BP 138/80   Pulse (!) 48   Ht 5\' 10"  (1.778 m)   Wt 187 lb 6.4 oz (85 kg)   SpO2 98%   BMI 26.89 kg/m   No ankle edema present  Assessment:    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 since he has had a long history of hyponatremia and CT scan did not show tumor He is usually asymptomatic  Sodium levels have been as low as 125 He is now on a regimen of Florinef 3 days a week with which she has had some improvement Sodium is slightly better at 131 Potassium normal  HYPOTHYROIDISM: No unusual fatigue TSH normal with 137 mcg brand-name  Synthroid which he will continue  Mild hypertension: Blood pressure is managed by PCP and well-controlled now  PLAN:  Continue Florinef 3 days a week as before Since blood pressure is well-controlled with valsartan he will continue this at the same time  No change in Synthroid 137 mcg daily  For his diabetes which is not currently treated with medications discussed needing to monitor blood sugars after meals more often and not just in the morning although generally blood sugars appear to be controlled  Follow-up in 4 months   There are no Patient Instructions on file for this visit.  Reather Littler 09/30/2022, 3:47 PM    Note: This office note was prepared with Dragon voice recognition system technology. Any transcriptional errors that result from this process are unintentional.

## 2022-10-07 ENCOUNTER — Encounter: Payer: Self-pay | Admitting: Podiatry

## 2022-10-07 ENCOUNTER — Ambulatory Visit (INDEPENDENT_AMBULATORY_CARE_PROVIDER_SITE_OTHER): Payer: Medicare Other | Admitting: Podiatry

## 2022-10-07 DIAGNOSIS — B351 Tinea unguium: Secondary | ICD-10-CM | POA: Diagnosis not present

## 2022-10-07 DIAGNOSIS — M79674 Pain in right toe(s): Secondary | ICD-10-CM

## 2022-10-07 DIAGNOSIS — E119 Type 2 diabetes mellitus without complications: Secondary | ICD-10-CM | POA: Diagnosis not present

## 2022-10-07 DIAGNOSIS — R2681 Unsteadiness on feet: Secondary | ICD-10-CM | POA: Diagnosis not present

## 2022-10-07 DIAGNOSIS — E1142 Type 2 diabetes mellitus with diabetic polyneuropathy: Secondary | ICD-10-CM

## 2022-10-07 DIAGNOSIS — I872 Venous insufficiency (chronic) (peripheral): Secondary | ICD-10-CM

## 2022-10-07 DIAGNOSIS — M25562 Pain in left knee: Secondary | ICD-10-CM | POA: Diagnosis not present

## 2022-10-07 DIAGNOSIS — M79675 Pain in left toe(s): Secondary | ICD-10-CM | POA: Diagnosis not present

## 2022-10-07 NOTE — Progress Notes (Signed)
ANNUAL DIABETIC FOOT EXAM  Subjective: MANSA STAMATIS presents today annual diabetic foot exam. He is accompanied by his wife on today's visit.  Patient confirms 89 year h/o diabetes.  Patient denies any h/o foot wounds.  Patient denies any numbness, tingling, burning, or pins/needle sensation in feet.  Risk factors: diabetes, HTN, CKD, hyperlipidemia, dyslipidemia, h/o tobacco use in remission.  Mort Sawyers, FNP is patient's PCP.  Past Medical History:  Diagnosis Date   Arthritis    both hands;Dr.Deveshwar   Constipation due to pain medication    Diabetes mellitus without complication (HCC)    Type II. Uses no medication. Pt controls with diet and weight   Enlarged prostate    Frequent urination at night    GERD (gastroesophageal reflux disease)    History of noncompliance with medical treatment, presenting hazards to health 11/20/2018   Hyperlipidemia associated with type 2 diabetes mellitus (HCC) 04/19/2007   Qualifier: Diagnosis of  By: Alwyn Ren MD, Chrissie Noa     Hypertension    Hypothyroidism    Polio    as a child w/o complications   Snoring    no sleep apnea   Statin declined-  11/20/2018   patient understands risks associated with this decision and has been advised to restart by cardiologist as well as myself several times   Torn meniscus    Transfusion history 2012   post TKR   Patient Active Problem List   Diagnosis Date Noted   Quadriceps weakness 08/17/2022   Muscle spasm 08/17/2022   Obstruction of sinonasal region 07/13/2022   Chronic pansinusitis 07/13/2022   Chronic pain of right lower extremity 07/08/2022   Elevated fecal calprotectin 05/06/2022   Chronic diarrhea 04/27/2022   Tinnitus of both ears 04/09/2022   Poor dentition 04/02/2022   Disorder of bone density and structure, unspecified 01/15/2022   Memory loss 11/26/2021   Balance problems 12/17/2020   Chronic pain of left ankle 12/17/2020   Dyslipidemia 11/30/2020   Hyperkalemia 02/05/2020    Chronic synovitis 01/18/2020   Thoracic aortic aneurysm (HCC) 12/10/2019   SIADH (syndrome of inappropriate ADH production) (HCC) 03/21/2019   Elevated coronary artery calcium score 11/25/2018   Aortic atherosclerosis (HCC) 10/05/2017   Lethargy 08/22/2017   Chronic pain of left knee 08/22/2017   Lumbar radiculopathy 06/16/2017   DM assoc with CKD (chronic kidney disease), stage III (HCC) 04/14/2017   Arthralgia of right temporomandibular joint 03/02/2017   Chronic eczematous otitis externa of both ears 03/02/2017   Presbycusis of both ears 03/02/2017   ETD (Eustachian tube dysfunction), right 09/15/2016   Trochanteric bursitis of right hip 09/07/2016   Age-related osteoporosis without current pathological fracture 07/16/2016   Hypomagnesemia 12/29/2015   Constipation 08/06/2015   Type 2 diabetes mellitus with other specified complication (HCC) 08/05/2015   Complete tear of right rotator cuff 12/03/2014   Chronic rhinitis 11/01/2014   Hypertension associated with diabetes (HCC) 03/20/2014   Spondylolisthesis of lumbar region 10/23/2013   Diverticulosis of colon without hemorrhage 01/22/2013   Anemia, unspecified 08/13/2012   GERD (gastroesophageal reflux disease) 03/26/2011   SENILE KERATOSIS 06/12/2008   Hyperlipidemia associated with type 2 diabetes mellitus (HCC) 04/19/2007   BPH (benign prostatic hyperplasia) 04/18/2007   Hypothyroidism 04/20/2006   Past Surgical History:  Procedure Laterality Date   ANTERIOR LAT LUMBAR FUSION Right 11/03/2017   Procedure: Right Lumbar three-four Anterolateral lumbar interbody fusion with lateral plate;  Surgeon: Maeola Harman, MD;  Location: [facility] OR;  Service: Neurosurgery;  Laterality: Right;  BACK SURGERY  2011   hemiarthroplasty 01/11/1999 and 6   CARDIAC CATHETERIZATION  09/2001   negative   COLONOSCOPY     COLONOSCOPY N/A 05/06/2022   Procedure: COLONOSCOPY;  Surgeon: Toney Reil, MD;  Location: [facility] ENDOSCOPY;  Service:  Gastroenterology;  Laterality: N/A;   COLONOSCOPY WITH PROPOFOL N/A 05/05/2022   Procedure: COLONOSCOPY WITH PROPOFOL;  Surgeon: Toney Reil, MD;  Location: [facility] ENDOSCOPY;  Service: Gastroenterology;  Laterality: N/A;   EYE SURGERY  2003   R&L cataract removed & IOL- 2003 & 2007,Dr.Jenkins   INGUINAL HERNIA REPAIR  2002   right, Dr. Luan Pulling   JOINT REPLACEMENT  2012   left knee replacement   LIPOMA EXCISION     back   MENISCECTOMY Bilateral    Dr. Thurston Hole   PROSTATE SURGERY  07/2006   for urinary urgency, Dr. Tiana Loft CUFF REPAIR Right October, 2016   sinus & throat surgery-1995  1995   TONSILLECTOMY     TOTAL KNEE ARTHROPLASTY  01/11/2011   Procedure: TOTAL KNEE ARTHROPLASTY;  Surgeon: Raymon Mutton, MD;  Location: [facility] OR;  Service: Orthopedics;  Laterality: Left;   TOTAL KNEE ARTHROPLASTY Right 11/22/2016   Procedure: TOTAL KNEE ARTHROPLASTY;  Surgeon: Dannielle Huh, MD;  Location: [facility] OR;  Service: Orthopedics;  Laterality: Right;   UVULECTOMY  1996   Dr. Lazarus Salines   VASECTOMY     WRIST SURGERY  1959   fracture- right   XI ROBOTIC ASSISTED VENTRAL HERNIA N/A 09/07/2020   Procedure: XI ROBOTIC ASSISTED DIAGNOSTIC LAPAROSCOPY;  Surgeon: Leafy Ro, MD;  Location: [facility] ORS;  Service: General;  Laterality: N/A;   Current Outpatient Medications on File Prior to Visit  Medication Sig Dispense Refill   ammonium lactate (AMLACTIN DAILY) 12 % lotion Apply 1 Application topically as needed for dry skin. 400 g 0   amoxicillin-clavulanate (AUGMENTIN) 875-125 MG tablet Take 1 tablet by mouth 2 (two) times daily.     aspirin 81 MG tablet Take 81 mg by mouth every evening.     celecoxib (CELEBREX) 200 MG capsule Take 200 mg by mouth 2 (two) times daily.     CLOBETASOL PROPIONATE E 0.05 % emollient cream as needed.     diclofenac Sodium (VOLTAREN) 1 % GEL Apply 2 g topically at bedtime. 100 g 2   finasteride (PROSCAR) 5 MG tablet Take 5 mg by mouth daily.     fludrocortisone  (FLORINEF) 0.1 MG tablet 1 tablet 3 days a week 40 tablet 3   fluticasone (FLONASE) 50 MCG/ACT nasal spray Place 2 sprays into both nostrils daily.     Hypromellose (ARTIFICIAL TEARS OP) Place 1 drop into both eyes 2 (two) times daily.     lidocaine (LIDODERM) 5 % Place 1 patch onto the skin every 12 (twelve) hours. Remove & Discard patch within 12 hours or as directed by MD 180 patch 3   lipase/protease/amylase (CREON) 36000 UNITS CPEP capsule Take 1 capsule (36,000 Units total) by mouth 3 (three) times daily before meals. 450 capsule 0   mometasone (ELOCON) 0.1 % ointment Apply topically daily.     Omega-3 Fatty Acids (FISH OIL) 1000 MG CAPS Take 1,000 mg by mouth every evening.     polyethylene glycol (MIRALAX) 17 g packet Take 17 g by mouth daily as needed for mild constipation or moderate constipation. 14 each 0   predniSONE (STERAPRED UNI-PAK 48 TAB) 5 MG (48) TBPK tablet Take 5 mg by mouth.  rosuvastatin (CRESTOR) 5 MG tablet Take 1 tablet (5 mg total) by mouth at bedtime. 90 tablet 3   SYNTHROID 137 MCG tablet TAKE 1 TABLET DAILY BEFORE BREAKFAST 90 tablet 3   tamsulosin (FLOMAX) 0.4 MG CAPS capsule Take 1 capsule (0.4 mg total) by mouth 2 (two) times daily. 90 capsule 3   valsartan (DIOVAN) 320 MG tablet Take 1 tablet (320 mg total) by mouth daily. 90 tablet 3   No current facility-administered medications on file prior to visit.    Allergies  Allergen Reactions   Simvastatin Other (See Comments)    MYALGIAS   Oxycodone Other (See Comments)    Severe constipation   Social History   Occupational History   Occupation: retired  Tobacco Use   Smoking status: Former    Current packs/day: 0.00    Average packs/day: 1 pack/day for 18.0 years (18.0 ttl pk-yrs)    Types: Cigarettes    Start date: 03/01/1948    Quit date: 03/01/1966    Years since quitting: 56.6    Passive exposure: Never   Smokeless tobacco: Never  Vaping Use   Vaping status: Never Used  Substance and Sexual  Activity   Alcohol use: Yes    Alcohol/week: 4.0 standard drinks of alcohol    Types: 4 Shots of liquor per week    Comment: social, few drinks a week   Drug use: No   Sexual activity: Yes    Birth control/protection: Post-menopausal, Surgical   Family History  Problem Relation Age of Onset   Coronary artery disease Mother    Emphysema Mother    Diverticulosis Mother    Thyroid disease Mother    Sudden death Father        accident   Asthma Brother    Bone cancer Brother    Thyroid disease Daughter    Sarcoidosis Son    Diabetes Paternal Grandmother    Anesthesia problems Neg Hx    Hypotension Neg Hx    Malignant hyperthermia Neg Hx    Pseudochol deficiency Neg Hx    Colon cancer Neg Hx    Immunization History  Administered Date(s) Administered   Fluad Quad(high Dose 65+) 12/05/2019   Influenza Whole 01/09/2002   Influenza, High Dose Seasonal PF 11/24/2018, 02/08/2019   Influenza,inj,Quad PF,6+ Mos 11/29/2012, 11/29/2013   Influenza-Unspecified 11/30/2014, 11/22/2015, 01/12/2018, 11/27/2021   MMR 07/12/2017   PFIZER(Purple Top)SARS-COV-2 Vaccination 03/11/2019, 04/01/2019, 11/07/2019, 06/18/2020   Pfizer Covid-19 Vaccine Bivalent Booster 30yrs & up 12/31/2020   Pneumococcal Conjugate-13 03/20/2014   Pneumococcal Polysaccharide-23 03/30/1996, 02/13/2010   Respiratory Syncytial Virus Vaccine,Recomb Aduvanted(Arexvy) 12/15/2021   Td 03/30/1996, 04/26/2007   Td (Adult) 03/30/1996, 04/26/2007   Tdap 07/24/2018   Unspecified SARS-COV-2 Vaccination 11/27/2021   Zoster Recombinant(Shingrix) 07/24/2018, 11/24/2018, 02/08/2019     Review of Systems: Negative except as noted in the HPI.   Objective: There were no vitals filed for this visit.  KHYON SEWER is a pleasant 89 y.o. male in NAD. AAO X 3.  Vascular Examination: Capillary refill time immediate b/l. Vascular status intact b/l with palpable pedal pulses. Pedal hair present b/l. No pain with calf compression b/l.  Skin temperature gradient WNL b/l. No cyanosis or clubbing b/l. No ischemia or gangrene noted b/l. Varicosities present b/l.  Neurological Examination: Protective sensation decreased with 10 gram monofilament b/l. Vibratory sensation intact b/l.  Dermatological Examination: No open wounds. No interdigital macerations.   Toenails 1-5 b/l thick, discolored, elongated with subungual debris and pain on dorsal  palpation.   Pedal skin thin, shiny and atrophic b/l LE.  Musculoskeletal Examination: Muscle strength 5/5 to all lower extremity muscle groups bilaterally. Limited joint ROM to the bilateral ankles. Utilizes cane for ambulation assistance.  Radiographs: None  Last A1c:      Latest Ref Rng & Units 09/30/2022   10:41 AM 03/25/2022   10:29 AM 11/09/2021    1:50 PM  Hemoglobin A1C  Hemoglobin-A1c 4.0 - 5.6 % 6.4  6.9  6.9     ADA Risk Categorization: High Risk  Patient has one or more of the following: Loss of protective sensation Absent pedal pulses Severe Foot deformity History of foot ulcer  Assessment: 1. Pain due to onychomycosis of toenails of both feet   2. Venous (peripheral) insufficiency   3. Diabetic peripheral neuropathy associated with type 2 diabetes mellitus (HCC)   4. Encounter for diabetic foot exam [facility])     Plan: -Patient's family member present. All questions/concerns addressed on today's visit. -Diabetic foot examination performed today. -Continue diabetic foot care principles: inspect feet daily, monitor glucose as recommended by PCP and/or Endocrinologist, and follow prescribed diet per PCP, Endocrinologist and/or dietician. -Patient to continue soft, supportive shoe gear daily. -Toenails 1-5 b/l were debrided in length and girth with sterile nail nippers and dremel without iatrogenic bleeding.  -Patient/POA to call should there be question/concern in the interim. Return in about 3 months (around 01/07/2023).  Freddie Breech, DPM

## 2022-10-08 DIAGNOSIS — R2681 Unsteadiness on feet: Secondary | ICD-10-CM | POA: Diagnosis not present

## 2022-10-08 DIAGNOSIS — M25562 Pain in left knee: Secondary | ICD-10-CM | POA: Diagnosis not present

## 2022-10-11 ENCOUNTER — Encounter (INDEPENDENT_AMBULATORY_CARE_PROVIDER_SITE_OTHER): Payer: Medicare Other | Admitting: Ophthalmology

## 2022-10-13 DIAGNOSIS — M25562 Pain in left knee: Secondary | ICD-10-CM | POA: Diagnosis not present

## 2022-10-13 DIAGNOSIS — R2681 Unsteadiness on feet: Secondary | ICD-10-CM | POA: Diagnosis not present

## 2022-10-15 DIAGNOSIS — R2681 Unsteadiness on feet: Secondary | ICD-10-CM | POA: Diagnosis not present

## 2022-10-15 DIAGNOSIS — M25562 Pain in left knee: Secondary | ICD-10-CM | POA: Diagnosis not present

## 2022-10-18 DIAGNOSIS — R2681 Unsteadiness on feet: Secondary | ICD-10-CM | POA: Diagnosis not present

## 2022-10-18 DIAGNOSIS — M25562 Pain in left knee: Secondary | ICD-10-CM | POA: Diagnosis not present

## 2022-10-25 ENCOUNTER — Encounter (INDEPENDENT_AMBULATORY_CARE_PROVIDER_SITE_OTHER): Payer: Medicare Other | Admitting: Ophthalmology

## 2022-10-25 DIAGNOSIS — H43813 Vitreous degeneration, bilateral: Secondary | ICD-10-CM | POA: Diagnosis not present

## 2022-10-25 DIAGNOSIS — H33301 Unspecified retinal break, right eye: Secondary | ICD-10-CM | POA: Diagnosis not present

## 2022-10-25 DIAGNOSIS — Z7984 Long term (current) use of oral hypoglycemic drugs: Secondary | ICD-10-CM | POA: Diagnosis not present

## 2022-10-25 DIAGNOSIS — E113291 Type 2 diabetes mellitus with mild nonproliferative diabetic retinopathy without macular edema, right eye: Secondary | ICD-10-CM

## 2022-10-25 DIAGNOSIS — E113212 Type 2 diabetes mellitus with mild nonproliferative diabetic retinopathy with macular edema, left eye: Secondary | ICD-10-CM

## 2022-10-25 DIAGNOSIS — I10 Essential (primary) hypertension: Secondary | ICD-10-CM

## 2022-10-25 DIAGNOSIS — H35033 Hypertensive retinopathy, bilateral: Secondary | ICD-10-CM

## 2022-10-26 DIAGNOSIS — M25562 Pain in left knee: Secondary | ICD-10-CM | POA: Diagnosis not present

## 2022-10-26 DIAGNOSIS — R2681 Unsteadiness on feet: Secondary | ICD-10-CM | POA: Diagnosis not present

## 2022-10-28 DIAGNOSIS — R2681 Unsteadiness on feet: Secondary | ICD-10-CM | POA: Diagnosis not present

## 2022-10-28 DIAGNOSIS — M25562 Pain in left knee: Secondary | ICD-10-CM | POA: Diagnosis not present

## 2022-11-03 DIAGNOSIS — R2681 Unsteadiness on feet: Secondary | ICD-10-CM | POA: Diagnosis not present

## 2022-11-03 DIAGNOSIS — M25562 Pain in left knee: Secondary | ICD-10-CM | POA: Diagnosis not present

## 2022-11-04 ENCOUNTER — Emergency Department: Payer: Medicare Other

## 2022-11-04 ENCOUNTER — Inpatient Hospital Stay
Admission: EM | Admit: 2022-11-04 | Discharge: 2022-11-08 | DRG: 536 | Disposition: A | Discharge status: Skilled Nursing Facility | Payer: Medicare Other | Attending: Internal Medicine | Admitting: Internal Medicine

## 2022-11-04 ENCOUNTER — Encounter: Payer: Self-pay | Admitting: Emergency Medicine

## 2022-11-04 ENCOUNTER — Other Ambulatory Visit: Payer: Self-pay

## 2022-11-04 DIAGNOSIS — Z833 Family history of diabetes mellitus: Secondary | ICD-10-CM

## 2022-11-04 DIAGNOSIS — E871 Hypo-osmolality and hyponatremia: Secondary | ICD-10-CM | POA: Diagnosis not present

## 2022-11-04 DIAGNOSIS — J310 Chronic rhinitis: Secondary | ICD-10-CM | POA: Diagnosis not present

## 2022-11-04 DIAGNOSIS — K219 Gastro-esophageal reflux disease without esophagitis: Secondary | ICD-10-CM | POA: Diagnosis not present

## 2022-11-04 DIAGNOSIS — Z888 Allergy status to other drugs, medicaments and biological substances status: Secondary | ICD-10-CM

## 2022-11-04 DIAGNOSIS — Z66 Do not resuscitate: Secondary | ICD-10-CM | POA: Diagnosis present

## 2022-11-04 DIAGNOSIS — S32502A Unspecified fracture of left pubis, initial encounter for closed fracture: Secondary | ICD-10-CM | POA: Diagnosis not present

## 2022-11-04 DIAGNOSIS — Z8612 Personal history of poliomyelitis: Secondary | ICD-10-CM

## 2022-11-04 DIAGNOSIS — Z79890 Hormone replacement therapy (postmenopausal): Secondary | ICD-10-CM

## 2022-11-04 DIAGNOSIS — Z7982 Long term (current) use of aspirin: Secondary | ICD-10-CM | POA: Diagnosis not present

## 2022-11-04 DIAGNOSIS — S32512D Fracture of superior rim of left pubis, subsequent encounter for fracture with routine healing: Secondary | ICD-10-CM | POA: Diagnosis not present

## 2022-11-04 DIAGNOSIS — Z87891 Personal history of nicotine dependence: Secondary | ICD-10-CM | POA: Diagnosis not present

## 2022-11-04 DIAGNOSIS — Z96652 Presence of left artificial knee joint: Secondary | ICD-10-CM | POA: Diagnosis not present

## 2022-11-04 DIAGNOSIS — E1169 Type 2 diabetes mellitus with other specified complication: Secondary | ICD-10-CM | POA: Diagnosis present

## 2022-11-04 DIAGNOSIS — Z981 Arthrodesis status: Secondary | ICD-10-CM

## 2022-11-04 DIAGNOSIS — N400 Enlarged prostate without lower urinary tract symptoms: Secondary | ICD-10-CM | POA: Diagnosis present

## 2022-11-04 DIAGNOSIS — Z885 Allergy status to narcotic agent status: Secondary | ICD-10-CM | POA: Diagnosis not present

## 2022-11-04 DIAGNOSIS — W19XXXA Unspecified fall, initial encounter: Secondary | ICD-10-CM | POA: Diagnosis not present

## 2022-11-04 DIAGNOSIS — M6281 Muscle weakness (generalized): Secondary | ICD-10-CM | POA: Diagnosis not present

## 2022-11-04 DIAGNOSIS — M79605 Pain in left leg: Secondary | ICD-10-CM | POA: Diagnosis not present

## 2022-11-04 DIAGNOSIS — Z79899 Other long term (current) drug therapy: Secondary | ICD-10-CM

## 2022-11-04 DIAGNOSIS — I129 Hypertensive chronic kidney disease with stage 1 through stage 4 chronic kidney disease, or unspecified chronic kidney disease: Secondary | ICD-10-CM | POA: Diagnosis not present

## 2022-11-04 DIAGNOSIS — M160 Bilateral primary osteoarthritis of hip: Secondary | ICD-10-CM | POA: Diagnosis present

## 2022-11-04 DIAGNOSIS — M25562 Pain in left knee: Secondary | ICD-10-CM | POA: Diagnosis not present

## 2022-11-04 DIAGNOSIS — I712 Thoracic aortic aneurysm, without rupture, unspecified: Secondary | ICD-10-CM | POA: Diagnosis not present

## 2022-11-04 DIAGNOSIS — Z751 Person awaiting admission to adequate facility elsewhere: Secondary | ICD-10-CM | POA: Diagnosis not present

## 2022-11-04 DIAGNOSIS — I10 Essential (primary) hypertension: Secondary | ICD-10-CM | POA: Diagnosis present

## 2022-11-04 DIAGNOSIS — M25462 Effusion, left knee: Secondary | ICD-10-CM | POA: Diagnosis not present

## 2022-11-04 DIAGNOSIS — N401 Enlarged prostate with lower urinary tract symptoms: Secondary | ICD-10-CM | POA: Diagnosis not present

## 2022-11-04 DIAGNOSIS — Z743 Need for continuous supervision: Secondary | ICD-10-CM | POA: Diagnosis not present

## 2022-11-04 DIAGNOSIS — E039 Hypothyroidism, unspecified: Secondary | ICD-10-CM | POA: Diagnosis present

## 2022-11-04 DIAGNOSIS — Z9181 History of falling: Secondary | ICD-10-CM | POA: Diagnosis not present

## 2022-11-04 DIAGNOSIS — S32592A Other specified fracture of left pubis, initial encounter for closed fracture: Principal | ICD-10-CM | POA: Diagnosis present

## 2022-11-04 DIAGNOSIS — Z8349 Family history of other endocrine, nutritional and metabolic diseases: Secondary | ICD-10-CM | POA: Diagnosis not present

## 2022-11-04 DIAGNOSIS — Z825 Family history of asthma and other chronic lower respiratory diseases: Secondary | ICD-10-CM

## 2022-11-04 DIAGNOSIS — M25572 Pain in left ankle and joints of left foot: Secondary | ICD-10-CM | POA: Diagnosis not present

## 2022-11-04 DIAGNOSIS — M5135 Other intervertebral disc degeneration, thoracolumbar region: Secondary | ICD-10-CM | POA: Diagnosis not present

## 2022-11-04 DIAGNOSIS — S72002A Fracture of unspecified part of neck of left femur, initial encounter for closed fracture: Secondary | ICD-10-CM | POA: Diagnosis not present

## 2022-11-04 DIAGNOSIS — M25559 Pain in unspecified hip: Secondary | ICD-10-CM | POA: Diagnosis not present

## 2022-11-04 DIAGNOSIS — S79912A Unspecified injury of left hip, initial encounter: Secondary | ICD-10-CM | POA: Diagnosis not present

## 2022-11-04 DIAGNOSIS — M19012 Primary osteoarthritis, left shoulder: Secondary | ICD-10-CM | POA: Diagnosis not present

## 2022-11-04 DIAGNOSIS — Z96653 Presence of artificial knee joint, bilateral: Secondary | ICD-10-CM | POA: Diagnosis present

## 2022-11-04 DIAGNOSIS — Z8249 Family history of ischemic heart disease and other diseases of the circulatory system: Secondary | ICD-10-CM

## 2022-11-04 DIAGNOSIS — M25512 Pain in left shoulder: Secondary | ICD-10-CM | POA: Diagnosis not present

## 2022-11-04 DIAGNOSIS — I6782 Cerebral ischemia: Secondary | ICD-10-CM | POA: Diagnosis not present

## 2022-11-04 DIAGNOSIS — Z808 Family history of malignant neoplasm of other organs or systems: Secondary | ICD-10-CM

## 2022-11-04 DIAGNOSIS — M549 Dorsalgia, unspecified: Secondary | ICD-10-CM | POA: Diagnosis not present

## 2022-11-04 DIAGNOSIS — Z91199 Patient's noncompliance with other medical treatment and regimen due to unspecified reason: Secondary | ICD-10-CM | POA: Diagnosis not present

## 2022-11-04 DIAGNOSIS — I700 Atherosclerosis of aorta: Secondary | ICD-10-CM | POA: Diagnosis not present

## 2022-11-04 DIAGNOSIS — R350 Frequency of micturition: Secondary | ICD-10-CM | POA: Diagnosis not present

## 2022-11-04 DIAGNOSIS — Z7952 Long term (current) use of systemic steroids: Secondary | ICD-10-CM

## 2022-11-04 DIAGNOSIS — S0990XA Unspecified injury of head, initial encounter: Secondary | ICD-10-CM | POA: Diagnosis not present

## 2022-11-04 DIAGNOSIS — E785 Hyperlipidemia, unspecified: Secondary | ICD-10-CM | POA: Diagnosis present

## 2022-11-04 DIAGNOSIS — S32592D Other specified fracture of left pubis, subsequent encounter for fracture with routine healing: Secondary | ICD-10-CM | POA: Diagnosis not present

## 2022-11-04 DIAGNOSIS — S32599A Other specified fracture of unspecified pubis, initial encounter for closed fracture: Secondary | ICD-10-CM

## 2022-11-04 DIAGNOSIS — R2689 Other abnormalities of gait and mobility: Secondary | ICD-10-CM | POA: Diagnosis not present

## 2022-11-04 DIAGNOSIS — S32512A Fracture of superior rim of left pubis, initial encounter for closed fracture: Secondary | ICD-10-CM | POA: Diagnosis not present

## 2022-11-04 DIAGNOSIS — R278 Other lack of coordination: Secondary | ICD-10-CM | POA: Diagnosis not present

## 2022-11-04 DIAGNOSIS — S199XXA Unspecified injury of neck, initial encounter: Secondary | ICD-10-CM | POA: Diagnosis not present

## 2022-11-04 DIAGNOSIS — N183 Chronic kidney disease, stage 3 unspecified: Secondary | ICD-10-CM | POA: Diagnosis not present

## 2022-11-04 LAB — COMPREHENSIVE METABOLIC PANEL
ALT: 16 U/L (ref 0–44)
AST: 21 U/L (ref 15–41)
Albumin: 3.8 g/dL (ref 3.5–5.0)
Alkaline Phosphatase: 72 U/L (ref 38–126)
Anion gap: 10 (ref 5–15)
BUN: 16 mg/dL (ref 8–23)
CO2: 24 mmol/L (ref 22–32)
Calcium: 8.9 mg/dL (ref 8.9–10.3)
Chloride: 98 mmol/L (ref 98–111)
Creatinine, Ser: 0.78 mg/dL (ref 0.61–1.24)
GFR, Estimated: >60 mL/min (ref >60)
Glucose, Bld: 131 mg/dL — ABNORMAL HIGH (ref 70–99)
Potassium: 4 mmol/L (ref 3.5–5.1)
Sodium: 132 mmol/L — ABNORMAL LOW (ref 135–145)
Total Bilirubin: 1 mg/dL (ref 0.3–1.2)
Total Protein: 6.8 g/dL (ref 6.5–8.1)

## 2022-11-04 LAB — CBC WITH DIFFERENTIAL/PLATELET
Abs Immature Granulocytes: 0.05 10*3/uL (ref 0.00–0.07)
Basophils Absolute: 0 10*3/uL (ref 0.0–0.1)
Basophils Relative: 0 %
Eosinophils Absolute: 0 10*3/uL (ref 0.0–0.5)
Eosinophils Relative: 0 %
HCT: 33.7 % — ABNORMAL LOW (ref 39.0–52.0)
Hemoglobin: 11 g/dL — ABNORMAL LOW (ref 13.0–17.0)
Immature Granulocytes: 1 %
Lymphocytes Relative: 12 %
Lymphs Abs: 1.2 10*3/uL (ref 0.7–4.0)
MCH: 30.4 pg (ref 26.0–34.0)
MCHC: 32.6 g/dL (ref 30.0–36.0)
MCV: 93.1 fL (ref 80.0–100.0)
Monocytes Absolute: 0.7 10*3/uL (ref 0.1–1.0)
Monocytes Relative: 7 %
Neutro Abs: 7.7 10*3/uL (ref 1.7–7.7)
Neutrophils Relative %: 80 %
Platelets: 195 10*3/uL (ref 150–400)
RBC: 3.62 MIL/uL — ABNORMAL LOW (ref 4.22–5.81)
RDW: 12.2 % (ref 11.5–15.5)
WBC: 9.7 10*3/uL (ref 4.0–10.5)
nRBC: 0 % (ref 0.0–0.2)

## 2022-11-04 LAB — GLUCOSE, CAPILLARY: Glucose-Capillary: 178 mg/dL — ABNORMAL HIGH (ref 70–99)

## 2022-11-04 MED ORDER — ONDANSETRON HCL 4 MG/2ML IJ SOLN
4.0000 mg | Freq: Once | INTRAMUSCULAR | Status: AC
  Administered 2022-11-04: 4 mg via INTRAVENOUS
  Filled 2022-11-04: qty 2

## 2022-11-04 MED ORDER — IRBESARTAN 150 MG PO TABS
300.0000 mg | ORAL_TABLET | Freq: Every day | ORAL | Status: DC
  Administered 2022-11-05 – 2022-11-08 (×4): 300 mg via ORAL
  Filled 2022-11-04 (×4): qty 2

## 2022-11-04 MED ORDER — ONDANSETRON HCL 4 MG PO TABS: 4.0000 mg | ORAL_TABLET | Freq: Four times a day (QID) | ORAL | Status: DC | PRN

## 2022-11-04 MED ORDER — ROSUVASTATIN CALCIUM 10 MG PO TABS
5.0000 mg | ORAL_TABLET | Freq: Every day | ORAL | Status: DC
  Administered 2022-11-04 – 2022-11-07 (×4): 5 mg via ORAL
  Filled 2022-11-04 (×4): qty 1

## 2022-11-04 MED ORDER — POLYETHYLENE GLYCOL 3350 17 G PO PACK
17.0000 g | PACK | Freq: Every day | ORAL | Status: DC | PRN
  Administered 2022-11-06: 17 g via ORAL
  Filled 2022-11-04: qty 1

## 2022-11-04 MED ORDER — INSULIN ASPART 100 UNIT/ML IJ SOLN: 0.0000 [IU] | Freq: Every day | INTRAMUSCULAR | Status: DC

## 2022-11-04 MED ORDER — TAMSULOSIN HCL 0.4 MG PO CAPS
0.4000 mg | ORAL_CAPSULE | Freq: Two times a day (BID) | ORAL | Status: DC
  Administered 2022-11-04 – 2022-11-08 (×8): 0.4 mg via ORAL
  Filled 2022-11-04 (×8): qty 1

## 2022-11-04 MED ORDER — LACTATED RINGERS IV SOLN: INTRAVENOUS | Status: DC

## 2022-11-04 MED ORDER — PANCRELIPASE (LIP-PROT-AMYL) 12000-38000 UNITS PO CPEP
36000.0000 [IU] | ORAL_CAPSULE | Freq: Three times a day (TID) | ORAL | Status: DC
  Administered 2022-11-05 – 2022-11-08 (×11): 36000 [IU] via ORAL
  Filled 2022-11-04 (×12): qty 3

## 2022-11-04 MED ORDER — HYDROCODONE-ACETAMINOPHEN 5-325 MG PO TABS
1.0000 | ORAL_TABLET | ORAL | Status: DC | PRN
  Administered 2022-11-04 – 2022-11-08 (×4): 1 via ORAL
  Filled 2022-11-04 (×4): qty 1

## 2022-11-04 MED ORDER — MORPHINE SULFATE (PF) 4 MG/ML IV SOLN
4.0000 mg | Freq: Once | INTRAVENOUS | Status: AC
  Administered 2022-11-04: 4 mg via INTRAVENOUS
  Filled 2022-11-04: qty 1

## 2022-11-04 MED ORDER — FINASTERIDE 5 MG PO TABS
5.0000 mg | ORAL_TABLET | Freq: Every day | ORAL | Status: DC
  Administered 2022-11-05 – 2022-11-08 (×4): 5 mg via ORAL
  Filled 2022-11-04 (×4): qty 1

## 2022-11-04 MED ORDER — KETOROLAC TROMETHAMINE 15 MG/ML IJ SOLN
15.0000 mg | Freq: Four times a day (QID) | INTRAMUSCULAR | Status: DC | PRN
  Administered 2022-11-06 – 2022-11-07 (×3): 15 mg via INTRAVENOUS
  Filled 2022-11-04 (×4): qty 1

## 2022-11-04 MED ORDER — INSULIN ASPART 100 UNIT/ML IJ SOLN
0.0000 [IU] | Freq: Three times a day (TID) | INTRAMUSCULAR | Status: DC
  Administered 2022-11-06 – 2022-11-08 (×2): 2 [IU] via SUBCUTANEOUS
  Filled 2022-11-04 (×2): qty 1

## 2022-11-04 MED ORDER — HYDROCODONE-ACETAMINOPHEN 5-325 MG PO TABS
1.0000 | ORAL_TABLET | Freq: Once | ORAL | Status: AC
  Administered 2022-11-04: 1 via ORAL
  Filled 2022-11-04: qty 1

## 2022-11-04 MED ORDER — ONDANSETRON HCL 4 MG/2ML IJ SOLN: 4.0000 mg | Freq: Four times a day (QID) | INTRAMUSCULAR | Status: DC | PRN

## 2022-11-04 MED ORDER — LEVOTHYROXINE SODIUM 137 MCG PO TABS
137.0000 ug | ORAL_TABLET | Freq: Every day | ORAL | Status: DC
  Administered 2022-11-05 – 2022-11-08 (×4): 137 ug via ORAL
  Filled 2022-11-04 (×4): qty 1

## 2022-11-04 MED ORDER — ENOXAPARIN SODIUM 40 MG/0.4ML IJ SOSY
40.0000 mg | PREFILLED_SYRINGE | INTRAMUSCULAR | Status: DC
  Administered 2022-11-04 – 2022-11-07 (×4): 40 mg via SUBCUTANEOUS
  Filled 2022-11-04 (×4): qty 0.4

## 2022-11-04 NOTE — ED Provider Notes (Signed)
[facility] Provider Note  Patient Contact: 6:50 PM (approximate)   History   Fall   HPI  Kyle Simon is a 89 y.o. male who presents the emergency department after mechanical fall.  Patient was outside in his driveway, tending to pick up some branches that had fallen when he lost control of his balance and started to fall.  Patient states that he tried to land and roll on his left side but landed awkwardly.  He was complaining of hitting his head but no loss of consciousness, shoulder pain, knee pain, hip pain.  He states that the majority of his injuries seem to be minor except he is having significant pain in the left hip and was unable to bear weight after this fall.  Patient states that somebody had to help him stand up and he has not been able to put weight on the leg since.  Patient denies any headache, visual changes, unilateral weakness, slurred speech, chest pain, shortness of breath.     Physical Exam   Triage Vital Signs: ED Triage Vitals  Encounter Vitals Group     BP 11/04/22 1155 138/75     Systolic BP Percentile --      Diastolic BP Percentile --      Pulse Rate 11/04/22 1155 (!) 54     Resp 11/04/22 1155 18     Temp 11/04/22 1155 97.8 F (36.6 C)     Temp Source 11/04/22 1155 Oral     SpO2 11/04/22 1155 97 %     Weight 11/04/22 1156 187 lb 6.3 oz (85 kg)     Height 11/04/22 1156 5\' 10"  (1.778 m)     Head Circumference --      Peak Flow --      Pain Score 11/04/22 1156 0     Pain Loc --      Pain Education --      Exclude from Growth Chart --     Most recent vital signs: Vitals:   11/04/22 1155  BP: 138/75  Pulse: (!) 54  Resp: 18  Temp: 97.8 F (36.6 C)  SpO2: 97%     General: Alert and in no acute distress Head: Slight abrasion noted to the left head.  No open wound laceration.  No large hematoma.  No significant tenderness of the osseous structures of the head or face.  No battle signs, raccoon eyes, serosanguineous  fluid drainage from the ears or nares.  Neck: No stridor. No cervical spine tenderness to palpation.  Cardiovascular:  Good peripheral perfusion Respiratory: Normal respiratory effort without tachypnea or retractions. Lungs CTAB. Good air entry to the bases with no decreased or absent breath sounds. Musculoskeletal: Full range of motion to all extremities.  Patient has good range of motion to the left shoulder, left elbow and wrist.  Slight tenderness along the posterior shoulder without palpable abnormality.  No crepitus.  Pulses sensation intact distally.  Patient has no shortening or rotation of left lower extremity, no tenderness over the lateral hip but does have some tenderness over the left groin region.  No palpable abnormalities in this region.  He still able to extend and flex the hip slightly but it is limited due to pain.  Good range of motion to the knee and ankle joint left side.  Pulse and sensation intact distally. Neurologic:  No gross focal neurologic deficits are appreciated.  Cranial 2 through 12 grossly intact. Skin:   No rash noted  Other:   ED Results / Procedures / Treatments   Labs (all labs ordered are listed, but only abnormal results are displayed) Labs Reviewed  COMPREHENSIVE METABOLIC PANEL - Abnormal; Notable for the following components:      Result Value   Sodium 132 (*)    Glucose, Bld 131 (*)    All other components within normal limits  CBC WITH DIFFERENTIAL/PLATELET - Abnormal; Notable for the following components:   RBC 3.62 (*)    Hemoglobin 11.0 (*)    HCT 33.7 (*)    All other components within normal limits     EKG     RADIOLOGY  I personally viewed, evaluated, and interpreted these images as part of my medical decision making, as well as reviewing the written report by the radiologist.  ED Provider Interpretation: No acute traumatic finding to the head, cervical spine, shoulder, knee or ankle.  Patient does have superior and inferior  pubic rami fractures on the left side.  CT Head Wo Contrast  Result Date: 11/04/2022 CLINICAL DATA:  Head trauma, moderate-severe fall, hit head; Polytrauma, blunt EXAM: CT HEAD WITHOUT CONTRAST CT CERVICAL SPINE WITHOUT CONTRAST TECHNIQUE: Multidetector CT imaging of the head and cervical spine was performed following the standard protocol without intravenous contrast. Multiplanar CT image reconstructions of the cervical spine were also generated. RADIATION DOSE REDUCTION: This exam was performed according to the departmental dose-optimization program which includes automated exposure control, adjustment of the mA and/or kV according to patient size and/or use of iterative reconstruction technique. COMPARISON:  CT Head 07/10/22 FINDINGS: CT HEAD FINDINGS Brain: No evidence of acute infarction, hemorrhage, hydrocephalus, extra-axial collection or mass lesion/mass effect. 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 Vascular: No hyperdense vessel or unexpected calcification. Skull: Normal. Negative for fracture or focal lesion. Sinuses/Orbits: No middle ear or mastoid effusion. OMC pattern left sided sinusitis. Bilateral lens replacement. Orbits are otherwise unremarkable. Other: None. CT CERVICAL SPINE FINDINGS Alignment: Normal. Skull base and vertebrae: No acute fracture. No primary bone lesion or focal pathologic process. Soft tissues and spinal canal: No prevertebral fluid or swelling. No visible canal hematoma. Disc levels:  No evidence of high-grade spinal canal stenosis. Upper chest: Negative. Other: Soft tissue lipoma along the midline posterior back. IMPRESSION: 1. No acute intracranial abnormality. 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 2. No acute fracture or  traumatic malalignment of the cervical spine. Electronically Signed   By: Lorenza Cambridge M.D.   On: 11/04/2022 16:08   CT Cervical Spine Wo Contrast  Result Date: 11/04/2022 CLINICAL DATA:  Head trauma, moderate-severe fall, hit head; Polytrauma, blunt EXAM: CT HEAD WITHOUT CONTRAST CT CERVICAL SPINE WITHOUT CONTRAST TECHNIQUE: Multidetector CT imaging of the head and cervical spine was performed following the standard protocol without intravenous contrast. Multiplanar CT image reconstructions of the cervical spine were also generated. RADIATION DOSE REDUCTION: This exam was performed according to the departmental dose-optimization program which includes automated exposure control, adjustment of the mA and/or kV according to patient size and/or use of iterative reconstruction technique. COMPARISON:  CT Head 07/10/22 FINDINGS: CT HEAD FINDINGS Brain: No evidence of acute infarction, hemorrhage, hydrocephalus, extra-axial collection or mass lesion/mass effect. 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 Vascular: No hyperdense vessel or unexpected calcification. Skull: Normal. Negative for fracture or focal lesion. Sinuses/Orbits: No middle ear or mastoid effusion. OMC pattern left sided sinusitis. Bilateral lens replacement. Orbits are otherwise unremarkable. Other: None. CT CERVICAL SPINE FINDINGS Alignment: Normal. Skull base and vertebrae: No acute fracture. No primary bone lesion or focal pathologic process. Soft tissues and spinal canal: No prevertebral fluid or swelling. No visible canal hematoma. Disc levels:  No evidence of high-grade spinal canal stenosis. Upper chest: Negative. Other: Soft tissue lipoma along the midline posterior back. IMPRESSION: 1. No acute intracranial abnormality. Sequela of moderate chronic microvascular ischemic change with a chronic infarct in the left basal  ganglia and an age indeterminate infarct in the right thalamus. 2. No acute fracture or traumatic malalignment of the cervical spine. Electronically Signed   By: Lorenza Cambridge M.D.   On: 11/04/2022 16:08   DG Lumbar Spine 2-3 Views  Result Date: 11/04/2022 CLINICAL DATA:  Fall with back pain.  Hip pain. EXAM: LUMBAR SPINE - 2-3 VIEW COMPARISON:  None Available. FINDINGS: Cross-table lateral views are limited due to overlying artifact. Postsurgical change at L3-L4 and L4-L5 with intact hardware. There is no evidence of acute fracture. Moderate degenerative disc disease at T12-L1, L1-L2, and L2-L3. The posterior elements are not well assessed. IMPRESSION: 1. No evidence of acute fracture of the lumbar spine. Limited posterior element assessment. 2. Postsurgical change at L3-L4 and L4-L5 with intact hardware. Electronically Signed   By: Narda Rutherford M.D.   On: 11/04/2022 15:47   DG Knee Complete 4 Views Left  Result Date: 11/04/2022 CLINICAL DATA:  Fall with left knee pain. EXAM: LEFT KNEE - COMPLETE 4+ VIEW COMPARISON:  None Available. FINDINGS: Left knee arthroplasty. No acute or periprosthetic fracture. No periprosthetic lucency. There has been patellar resurfacing. Minimal knee joint effusion. No erosive change. Unremarkable soft tissues. IMPRESSION: Left knee arthroplasty without complication or acute fracture. Electronically Signed   By: Narda Rutherford M.D.   On: 11/04/2022 15:46   DG Shoulder Left  Result Date: 11/04/2022 CLINICAL DATA:  Status post fall with left shoulder pain. EXAM: LEFT SHOULDER - 2+ VIEW COMPARISON:  None Available. FINDINGS: There is no evidence of fracture or dislocation. Minor acromioclavicular and glenohumeral degenerative change. No erosions. The included ribs are intact. Soft tissues are unremarkable. IMPRESSION: No fracture or dislocation of the left shoulder. Minor degenerative change. Electronically Signed   By: Narda Rutherford M.D.   On: 11/04/2022 15:44   DG  Ankle 2 Views Left  Result Date: 11/04/2022 CLINICAL DATA:  fall.  Left leg pain. EXAM: LEFT ANKLE - 2 VIEW COMPARISON:  None Available. FINDINGS: No acute fracture or dislocation. No aggressive osseous lesion. Ankle mortise appears intact. There is mild asymmetric soft tissue swelling overlying the lateral malleolus without discrete underlying fracture, favoring soft tissue/ligamentous injury. No radiopaque foreign bodies. IMPRESSION: 1. Lateral soft tissue swelling. No acute fracture or dislocation. Electronically Signed   By: Jules Schick M.D.   On: 11/04/2022 14:50   DG Hip Unilat With Pelvis 2-3 Views Left  Result Date: 11/04/2022 CLINICAL DATA:  fall, hip pain. EXAM: DG HIP (WITH OR WITHOUT PELVIS) 2-3V LEFT COMPARISON:  CT scan abdomen and pelvis from 09/06/2020. FINDINGS: There is mildly displaced fracture of the left superior and inferior pubic rami, which is of indeterminate age. The fracture margins are not well visualized therefore dating of the fracture is limited; however favored acute. These fractures are new since  the prior study from 09/06/2020. Correlate with physical examination for tenderness. Pelvis is intact with normal and symmetric sacroiliac joints. No aggressive osseous lesion. Visualized sacral arcuate lines are unremarkable. Unremarkable symphysis pubis. There are moderate degenerative changes of bilateral hip joints in the form of reduced joint space and osteophytosis. No radiopaque foreign bodies. Partially seen lower lumbar spinal fixation hardware. There are multiple metallic anchors overlying the right lower pelvis, likely from prior hernia repair. IMPRESSION: 1. Mildly displaced fracture of the left superior and inferior pubic rami, of indeterminate age. Correlate with physical examination. 2. Moderate bilateral hip osteoarthritis. Electronically Signed   By: Jules Schick M.D.   On: 11/04/2022 14:47    PROCEDURES:  Critical Care performed:  No  Procedures   MEDICATIONS ORDERED IN ED: Medications  HYDROcodone-acetaminophen (NORCO/VICODIN) 5-325 MG per tablet 1 tablet (1 tablet Oral Given 11/04/22 1521)  morphine (PF) 4 MG/ML injection 4 mg (4 mg Intravenous Given 11/04/22 1808)  ondansetron (ZOFRAN) injection 4 mg (4 mg Intravenous Given 11/04/22 1807)     IMPRESSION / MDM / ASSESSMENT AND PLAN / ED COURSE  I reviewed the triage vital signs and the nursing notes.                                 Differential diagnosis includes, but is not limited to, concussion, skull fracture, intracranial hemorrhage, shoulder fracture, shoulder dislocation, hip fracture, pelvic fracture, contusions   Patient's presentation is most consistent with acute presentation with potential threat to life or bodily function.   Patient's diagnosis is consistent with fall, pubic rami fractures.  Patient presents emergency department after mechanical fall.  He landed on his left side while trying to pick up some items out of his driveway.  Patient did hit his head but did not lose consciousness.  Overall exam was reassuring though patient did have some tenderness in his left groin and was unable to bear weight on the left hip.  Imaging reveals superior and inferior pubic rami fractures.  Talk to orthopedics who advises that this is a weightbearing injury but there is a good chance that the patient would not be out of bear weight initially due to pain.  If patient was admitted due to pain, Ortho would follow while admitted.  However there is no indication for surgical intervention at this time.  Patient was able to stand on the hip but is unable to step or ambulate even with the assistance of a walker.  At this time patient will be admitted for pain control.Marland Kitchen     FINAL CLINICAL IMPRESSION(S) / ED DIAGNOSES   Final diagnoses:  Fall, initial encounter  Closed fracture of multiple pubic rami, initial encounter (HCC)     Rx / DC Orders   ED Discharge  Orders     None        Note:  This document was prepared using Dragon voice recognition software and may include unintentional dictation errors.   Lanette Hampshire 11/04/22 1903    Minna Antis, MD 11/04/22 1924

## 2022-11-04 NOTE — ED Notes (Signed)
Pt laying in bed, reports pain upon movement or when pressure is applied to left side. Abrasions noted on left elbow. Pt is able to move leg but has limited movement for left leg.

## 2022-11-04 NOTE — H&P (Signed)
History and Physical    Patient: Kyle Simon UJW:[id number] DOB: [birth year] DOA: 11/04/2022 DOS: the patient was seen and examined on 11/04/2022 PCP: Mort Sawyers, FNP  Patient coming from: Home  Chief Complaint:  Chief Complaint  Patient presents with   Fall   HPI: Kyle Simon is a 89 y.o. male with medical history significant of type 2 diabetes, BPH, GERD, hyperlipidemia, essential hypertension, hypothyroidism, history of childhood polio who presents to the ER after sustaining mechanical fall.  Patient was apparently leaning over to get tree branches and landed on the driveway.  He landed on his left side.  After falling he started having left hip pain where he could not stand on the leg.  He came to the ER where he was seen and evaluated and found to have left superior and inferior pubic rami fractures.  No other appreciable fractures.  Patient appears to have also bilateral hip osteoarthritis and is in excruciating pain rated as 7 out of 10.  He is fully awake and alert.  Did not hit his head.  Orthopedics consulted from the ER and recommended nonweightbearing for the night.  Admission to medicine as well as pain management.  Patient is therefore being admitted for further evaluation and treatment  Review of Systems: As mentioned in the history of present illness. All other systems reviewed and are negative. Past Medical History:  Diagnosis Date   Arthritis    both hands;Dr.Deveshwar   Constipation due to pain medication    Diabetes mellitus without complication (HCC)    Type II. Uses no medication. Pt controls with diet and weight   Enlarged prostate    Frequent urination at night    GERD (gastroesophageal reflux disease)    History of noncompliance with medical treatment, presenting hazards to health 11/20/2018   Hyperlipidemia associated with type 2 diabetes mellitus (HCC) 04/19/2007   Qualifier: Diagnosis of  By: Alwyn Ren MD, William     Hypertension    Hypothyroidism     Polio    as a child w/o complications   Snoring    no sleep apnea   Statin declined-  11/20/2018   patient understands risks associated with this decision and has been advised to restart by cardiologist as well as myself several times   Torn meniscus    Transfusion history 2012   post TKR   Past Surgical History:  Procedure Laterality Date   ANTERIOR LAT LUMBAR FUSION Right 11/03/2017   Procedure: Right Lumbar three-four Anterolateral lumbar interbody fusion with lateral plate;  Surgeon: Maeola Harman, MD;  Location: [facility] OR;  Service: Neurosurgery;  Laterality: Right;   BACK SURGERY  2011   hemiarthroplasty 01/11/1999 and 6   CARDIAC CATHETERIZATION  09/2001   negative   COLONOSCOPY     COLONOSCOPY N/A 05/06/2022   Procedure: COLONOSCOPY;  Surgeon: Toney Reil, MD;  Location: [facility] ENDOSCOPY;  Service: Gastroenterology;  Laterality: N/A;   COLONOSCOPY WITH PROPOFOL N/A 05/05/2022   Procedure: COLONOSCOPY WITH PROPOFOL;  Surgeon: Toney Reil, MD;  Location: [facility] ENDOSCOPY;  Service: Gastroenterology;  Laterality: N/A;   EYE SURGERY  2003   R&L cataract removed & IOL- 2003 & 2007,Dr.Jenkins   INGUINAL HERNIA REPAIR  2002   right, Dr. Luan Pulling   JOINT REPLACEMENT  2012   left knee replacement   LIPOMA EXCISION     back   MENISCECTOMY Bilateral    Dr. Thurston Hole   PROSTATE SURGERY  07/2006   for urinary urgency, Dr. Isabel Caprice  ROTATOR CUFF REPAIR Right October, 2016   sinus & throat surgery-1995  1995   TONSILLECTOMY     TOTAL KNEE ARTHROPLASTY  01/11/2011   Procedure: TOTAL KNEE ARTHROPLASTY;  Surgeon: Raymon Mutton, MD;  Location: [facility] OR;  Service: Orthopedics;  Laterality: Left;   TOTAL KNEE ARTHROPLASTY Right 11/22/2016   Procedure: TOTAL KNEE ARTHROPLASTY;  Surgeon: Dannielle Huh, MD;  Location: [facility] OR;  Service: Orthopedics;  Laterality: Right;   UVULECTOMY  1996   Dr. Lazarus Salines   VASECTOMY     WRIST SURGERY  1959   fracture- right   XI ROBOTIC ASSISTED VENTRAL HERNIA  N/A 09/07/2020   Procedure: XI ROBOTIC ASSISTED DIAGNOSTIC LAPAROSCOPY;  Surgeon: Leafy Ro, MD;  Location: [facility] ORS;  Service: General;  Laterality: N/A;   Social History:  reports that he quit smoking about 56 years ago. His smoking use included cigarettes. He started smoking about 74 years ago. He has a 18 pack-year smoking history. He has never been exposed to tobacco smoke. He has never used smokeless tobacco. He reports current alcohol use of about 4.0 standard drinks of alcohol per week. He reports that he does not use drugs.  Allergies  Allergen Reactions   Simvastatin Other (See Comments)    MYALGIAS   Oxycodone Other (See Comments)    Severe constipation    Family History  Problem Relation Age of Onset   Coronary artery disease Mother    Emphysema Mother    Diverticulosis Mother    Thyroid disease Mother    Sudden death Father        accident   Asthma Brother    Bone cancer Brother    Thyroid disease Daughter    Sarcoidosis Son    Diabetes Paternal Grandmother    Anesthesia problems Neg Hx    Hypotension Neg Hx    Malignant hyperthermia Neg Hx    Pseudochol deficiency Neg Hx    Colon cancer Neg Hx     Prior to Admission medications   Medication Sig Start Date End Date Taking? Authorizing Provider  ammonium lactate (AMLACTIN DAILY) 12 % lotion Apply 1 Application topically as needed for dry skin. 03/30/22   Candelaria Stagers, DPM  amoxicillin-clavulanate (AUGMENTIN) 875-125 MG tablet Take 1 tablet by mouth 2 (two) times daily. 08/18/22   [provider]  aspirin 81 MG tablet Take 81 mg by mouth every evening.    [provider]  celecoxib (CELEBREX) 200 MG capsule Take 200 mg by mouth 2 (two) times daily. 09/18/19   [provider]  CLOBETASOL PROPIONATE E 0.05 % emollient cream as needed. 07/09/21   [provider]  diclofenac Sodium (VOLTAREN) 1 % GEL Apply 2 g topically at bedtime. 07/13/22   Mort Sawyers, FNP  finasteride  (PROSCAR) 5 MG tablet Take 5 mg by mouth daily. 07/09/21   [provider]  fludrocortisone (FLORINEF) 0.1 MG tablet 1 tablet 3 days a week 08/20/21   Reather Littler, MD  fluticasone [facility]) 50 MCG/ACT nasal spray Place 2 sprays into both nostrils daily. 08/18/22   [provider]  Hypromellose (ARTIFICIAL TEARS OP) Place 1 drop into both eyes 2 (two) times daily.    [provider]  lidocaine (LIDODERM) 5 % Place 1 patch onto the skin every 12 (twelve) hours. Remove & Discard patch within 12 hours or as directed by MD 08/17/22 08/17/23  Mort Sawyers, FNP  lipase/protease/amylase (CREON) 36000 UNITS CPEP capsule Take 1 capsule (36,000 Units total) by  mouth 3 (three) times daily before meals. 09/15/22   Toney Reil, MD  mometasone (ELOCON) 0.1 % ointment Apply topically daily. 05/19/22   [provider]  Omega-3 Fatty Acids (FISH OIL) 1000 MG CAPS Take 1,000 mg by mouth every evening.    [provider]  polyethylene glycol (MIRALAX) 17 g packet Take 17 g by mouth daily as needed for mild constipation or moderate constipation. 01/07/22   Mort Sawyers, FNP  predniSONE (STERAPRED UNI-PAK 48 TAB) 5 MG (48) TBPK tablet Take 5 mg by mouth. 08/18/22   [provider]  rosuvastatin (CRESTOR) 5 MG tablet Take 1 tablet (5 mg total) by mouth at bedtime. 09/09/22   Mort Sawyers, FNP  SYNTHROID 137 MCG tablet TAKE 1 TABLET DAILY BEFORE BREAKFAST 01/06/22   Reather Littler, MD  tamsulosin (FLOMAX) 0.4 MG CAPS capsule Take 1 capsule (0.4 mg total) by mouth 2 (two) times daily. 07/08/22   Mort Sawyers, FNP  valsartan (DIOVAN) 320 MG tablet Take 1 tablet (320 mg total) by mouth daily. 09/13/22   Mort Sawyers, FNP    Physical Exam: Vitals:   11/04/22 1155 11/04/22 1156  BP: 138/75   Pulse: (!) 54   Resp: 18   Temp: 97.8 F (36.6 C)   TempSrc: Oral   SpO2: 97%   Weight:  85 kg  Height:  5\' 10"  (1.778 m)   Constitutional: Acutely ill looking, NAD, calm,  comfortable Eyes: PERRL, lids and conjunctivae normal ENMT: Mucous membranes are moist. Posterior pharynx clear of any exudate or lesions.Normal dentition.  Neck: normal, supple, no masses, no thyromegaly Respiratory: clear to auscultation bilaterally, no wheezing, no crackles. Normal respiratory effort. No accessory muscle use.  Cardiovascular: Regular rate and rhythm, no murmurs / rubs / gallops. No extremity edema. 2+ pedal pulses. No carotid bruits.  Abdomen: no tenderness, no masses palpated. No hepatosplenomegaly. Bowel sounds positive.  Musculoskeletal: Pelvic tenderness good range of motion, no joint swelling or tenderness, Skin: no rashes, lesions, ulcers. No induration Neurologic: CN 2-12 grossly intact. Sensation intact, DTR normal. Strength 5/5 in all 4.  Psychiatric: Normal judgment and insight. Alert and oriented x 3. Normal mood  Data Reviewed:  Hemoglobin is 11.0.  Sodium 132 glucose 131.  CT head without contrast and CT cervical spine all showed no acute findings.  Left ankle x-ray showed left lateral soft tissue swelling no acute fracture.  X-ray of the hip showed mildly displaced fracture of the left superior and inferior pubic rami of indeterminate age and moderate bilateral he was to arthritis  Assessment and Plan:  #1 status post fall: Patient will be admitted.  Initiate PT and OT consult.  Orthopedic already consulted and will see patient in the morning.  In the meantime continue with pain management and safety.  #2 left.  Inferior pubic rami fractures: Mildly displaced.  Nonsurgical per orthopedics.  Continue pain control with PT and OT.  #3 diabetes: Sliding scale insulin  #4 hypothyroidism: Continue with levothyroxine  #5 GERD: Continue with PPI  #6 BPH: Continue tamsulosin.  #7 hyperlipidemia: Continue with statin    Advance Care Planning:   Code Status: Do not attempt resuscitation (DNR) PRE-ARREST INTERVENTIONS DESIRED   Consults: Orthopedic  surgery  Family Communication: Wife at bedside  Severity of Illness: The appropriate patient status for this patient is INPATIENT. Inpatient status is judged to be reasonable and necessary in order to provide the required intensity of service to ensure the patient's safety. The patient's presenting symptoms, physical exam  findings, and initial radiographic and laboratory data in the context of their chronic comorbidities is felt to place them at high risk for further clinical deterioration. Furthermore, it is not anticipated that the patient will be medically stable for discharge from the hospital within 2 midnights of admission.   * I certify that at the point of admission it is my clinical judgment that the patient will require inpatient hospital care spanning beyond 2 midnights from the point of admission due to high intensity of service, high risk for further deterioration and high frequency of surveillance required.*  AuthorLonia Blood, MD 11/04/2022 7:11 PM  For on call review [email or web address].

## 2022-11-04 NOTE — Plan of Care (Signed)

## 2022-11-04 NOTE — Progress Notes (Signed)
11/04/22: [facility] ED RN Hospital Liaison for [facility] evaluated/screened patient chart for potential or prior engagements pending disposition.   Patient currently in imaging after a fall at home leaning over to pick up tree branches that had fallen.  Acuity Level 4. [facility] ED setting.  Chief Complaint: Fall on Left side with Left leg pain. Imaging pending.  PCP: Mort Sawyers Practice: [facility] Practice Contact: [phone] Insurance: [facility] ACO Reach/Medicare A/B.  1ED/30D-today's ED visit 2ED/12 Months (Today & for a FALL 07/10/22) 0IP/30 days or 12 mo.  1PAC/HH ([facility] 2018).  Htx of a FALL 07/10/22.  Cardiology follow up post 07/10/22 Fall and r/o syncope.  [facility] telephonic out reach on 07/13/22 successful post ED dc afer mechanical fall at home.   Gabriel Cirri RN MSN  [place]  [facility], Population Health Title: ED RN Liaison Email: [email or web address] Direct Dial: Teams  Website: [email or web address]    Note: [facility] ED liaison does not counter or interfere with Inpatient Transitions of Care discharge planning or disposition.

## 2022-11-04 NOTE — ED Notes (Signed)
Pt ambulated approx 10 feet in the room with a walker and one assist. Pt did not tolerate very well. Pt back to bed. Provider notified. Family at bedside.

## 2022-11-04 NOTE — ED Triage Notes (Signed)
Pt arrives POV after a fall today. Pt was leaning over to get tree branches and landed on driveway. Pt landed on left side. Endorses left hip pain where he can't stand on it. Denies LOC or blood thinners. Abrasion to left elbow.

## 2022-11-04 NOTE — ED Notes (Signed)
Provider at bedside. Family at bedside.

## 2022-11-05 DIAGNOSIS — W19XXXA Unspecified fall, initial encounter: Secondary | ICD-10-CM | POA: Diagnosis not present

## 2022-11-05 DIAGNOSIS — S32592A Other specified fracture of left pubis, initial encounter for closed fracture: Secondary | ICD-10-CM | POA: Diagnosis not present

## 2022-11-05 DIAGNOSIS — E039 Hypothyroidism, unspecified: Secondary | ICD-10-CM | POA: Diagnosis not present

## 2022-11-05 DIAGNOSIS — E1169 Type 2 diabetes mellitus with other specified complication: Secondary | ICD-10-CM | POA: Diagnosis not present

## 2022-11-05 DIAGNOSIS — N400 Enlarged prostate without lower urinary tract symptoms: Secondary | ICD-10-CM

## 2022-11-05 LAB — CBC
HCT: 30.6 % — ABNORMAL LOW (ref 39.0–52.0)
Hemoglobin: 10.2 g/dL — ABNORMAL LOW (ref 13.0–17.0)
MCH: 30.6 pg (ref 26.0–34.0)
MCHC: 33.3 g/dL (ref 30.0–36.0)
MCV: 91.9 fL (ref 80.0–100.0)
Platelets: 196 10*3/uL (ref 150–400)
RBC: 3.33 MIL/uL — ABNORMAL LOW (ref 4.22–5.81)
RDW: 12.3 % (ref 11.5–15.5)
WBC: 6.1 10*3/uL (ref 4.0–10.5)
nRBC: 0 % (ref 0.0–0.2)

## 2022-11-05 LAB — GLUCOSE, CAPILLARY
Glucose-Capillary: 120 mg/dL — ABNORMAL HIGH (ref 70–99)
Glucose-Capillary: 111 mg/dL — ABNORMAL HIGH (ref 70–99)
Glucose-Capillary: 172 mg/dL — ABNORMAL HIGH (ref 70–99)
Glucose-Capillary: 127 mg/dL — ABNORMAL HIGH (ref 70–99)

## 2022-11-05 LAB — COMPREHENSIVE METABOLIC PANEL
ALT: 14 U/L (ref 0–44)
AST: 17 U/L (ref 15–41)
Albumin: 3.5 g/dL (ref 3.5–5.0)
Alkaline Phosphatase: 64 U/L (ref 38–126)
Anion gap: 7 (ref 5–15)
BUN: 14 mg/dL (ref 8–23)
CO2: 26 mmol/L (ref 22–32)
Calcium: 8.3 mg/dL — ABNORMAL LOW (ref 8.9–10.3)
Chloride: 100 mmol/L (ref 98–111)
Creatinine, Ser: 0.79 mg/dL (ref 0.61–1.24)
GFR, Estimated: >60 mL/min (ref >60)
Glucose, Bld: 116 mg/dL — ABNORMAL HIGH (ref 70–99)
Potassium: 4.3 mmol/L (ref 3.5–5.1)
Sodium: 133 mmol/L — ABNORMAL LOW (ref 135–145)
Total Bilirubin: 0.7 mg/dL (ref 0.3–1.2)
Total Protein: 6.3 g/dL — ABNORMAL LOW (ref 6.5–8.1)

## 2022-11-05 MED ORDER — FLUDROCORTISONE ACETATE 0.1 MG PO TABS
0.1000 mg | ORAL_TABLET | ORAL | Status: DC
  Administered 2022-11-05: 0.1 mg via ORAL
  Filled 2022-11-05 (×2): qty 1

## 2022-11-05 MED ORDER — FLUTICASONE PROPIONATE 50 MCG/ACT NA SUSP
2.0000 | Freq: Every day | NASAL | Status: DC
  Administered 2022-11-05 – 2022-11-08 (×4): 2 via NASAL
  Filled 2022-11-05: qty 16

## 2022-11-05 MED ORDER — LIDOCAINE 5 % EX PTCH
1.0000 | MEDICATED_PATCH | Freq: Two times a day (BID) | CUTANEOUS | Status: DC
  Administered 2022-11-05 – 2022-11-08 (×4): 1 via TRANSDERMAL
  Filled 2022-11-05 (×7): qty 1

## 2022-11-05 MED ORDER — POLYVINYL ALCOHOL 1.4 % OP SOLN
1.0000 [drp] | Freq: Two times a day (BID) | OPHTHALMIC | Status: DC
  Administered 2022-11-05 – 2022-11-08 (×6): 1 [drp] via OPHTHALMIC
  Filled 2022-11-05: qty 15

## 2022-11-05 MED ORDER — FE FUM-VIT C-VIT B12-FA 460-60-0.01-1 MG PO CAPS
1.0000 | ORAL_CAPSULE | Freq: Every day | ORAL | Status: DC
  Administered 2022-11-05 – 2022-11-08 (×4): 1 via ORAL
  Filled 2022-11-05 (×4): qty 1

## 2022-11-05 NOTE — Progress Notes (Signed)
  Progress Note   Patient: Kyle Simon:[id number] DOB: 09/29/1932 DOA: 11/04/2022     1 DOS: the patient was seen and examined on 11/05/2022   Brief hospital course: Taken from H&P.   Kyle Simon is a 89 y.o. male with medical history significant of type 2 diabetes, BPH, GERD, hyperlipidemia, essential hypertension, hypothyroidism, history of childhood polio who presents to the ER after sustaining mechanical fall.   He was found to have left superior and inferior pubic rami fractures. No other appreciable fractures. Orthopedic was consulted from ER and they were recommending nonweightbearing for the night.  Conservative management.  Admitted for pain management.  9/6: Vital stable.  Labs with mild hyponatremia which seems stable and chronic.  Some decrease in hemoglobin to 10.2.  Starting on supplement. Pending PT and OT evaluation   Assessment and Plan: * Fall Left pubic rami fracture.  Patient has left mildly displaced pubic rami fracture secondary to mechanical fall. Orthopedic surgery was consulted from ED and they were recommending conservative management pain control and PT evaluation. -Continue with pain management -PT/OT evaluation  Type 2 diabetes mellitus with other specified complication (HCC) Diet control at home. -SSI with CBG monitoring  Hypothyroidism -Continue home Synthroid  BPH (benign prostatic hyperplasia) -Continue tamsulosin  Hyperlipidemia associated with type 2 diabetes mellitus (HCC) -Continue with home statin  GERD (gastroesophageal reflux disease) -Continue with PPI   Subjective: Patient was seen and examined today.  No pain while lying down.  Physical Exam: Vitals:   11/04/22 2004 11/04/22 2005 11/05/22 0414 11/05/22 0900  BP:   135/73 117/67  Pulse:   66 62  Resp:   18 16  Temp:   98.7 F (37.1 C) 98.3 F (36.8 C)  TempSrc:   Oral   SpO2: 98% 98% 92% 97%  Weight:      Height:       General.  Well-developed elderly man, in no  acute distress. Pulmonary.  Lungs clear bilaterally, normal respiratory effort. CV.  Regular rate and rhythm, no JVD, rub or murmur. Abdomen.  Soft, nontender, nondistended, BS positive. CNS.  Alert and oriented .  No focal neurologic deficit. Extremities.  No edema, no cyanosis, pulses intact and symmetrical. Psychiatry.  Judgment and insight appears normal.   Data Reviewed: Prior data reviewed  Family Communication: Discussed with wife at bedside  Disposition: Status is: Inpatient Remains inpatient appropriate because: Severity of illness, need pain control  Planned Discharge Destination:  To be determined  DVT prophylaxis.  Lovenox Time spent: 45 minutes  This record has been created using Conservation officer, historic buildings. Errors have been sought and corrected,but may not always be located. Such creation errors do not reflect on the standard of care.   Author: Arnetha Courser, MD 11/05/2022 11:41 AM  For on call review [email or web address].

## 2022-11-05 NOTE — Assessment & Plan Note (Signed)
Continue tamsulosin.

## 2022-11-05 NOTE — Hospital Course (Addendum)
Taken from H&P.   Kyle Simon is a 89 y.o. male with medical history significant of type 2 diabetes, BPH, GERD, hyperlipidemia, essential hypertension, hypothyroidism, history of childhood polio who presents to the ER after sustaining mechanical fall.   He was found to have left superior and inferior pubic rami fractures. No other appreciable fractures. Orthopedic was consulted from ER and they were recommending nonweightbearing for the night.  Conservative management.  Admitted for pain management.  9/6: Vital stable.  Labs with mild hyponatremia which seems stable and chronic.  Some decrease in hemoglobin to 10.2.  Starting on supplement. Pending PT and OT evaluation.  9/7: Hemodynamically stable, PT and OT recommending rehab.  9/8: Medically stable-awaiting SNF placement  9/9: Remain medically stable.  Is being discharged to [facility] for rehab.  Patient is being given Voltaren tablets to be used first and Norco only if Voltaren does not work.  He will continue the rest of his home medications and follow-up with his providers for further management.

## 2022-11-05 NOTE — Assessment & Plan Note (Signed)
-   Continue home Synthroid °

## 2022-11-05 NOTE — Evaluation (Addendum)
Physical Therapy Evaluation Patient Details Name: Kyle Simon MRN: [id number] DOB: [birth year] Today's Date: 11/05/2022  History of Present Illness  Kyle Simon is a 89 y.o. male who presents the emergency department after mechanical fall.  Patient was outside in his driveway, tending to pick up some branches that had fallen when he lost control of his balance and started to fall.  Patient states that he tried to land and roll on his left side but landed awkwardly.  He was complaining of hitting his head but no loss of consciousness, shoulder pain, knee pain, hip pain.  He states that the majority of his injuries seem to be minor except he is having significant pain in the left hip and was unable to bear weight after this fall.   Clinical Impression  Pt admitted with above diagnosis. Pt received supine in bed eating lunch, with nursing administering medication. Prior to current admit, pt IND w/ all ADL's living at home w/ wife. Pt lives in multi-level home, however bedroom and bathroom on main level and he does not frequent the upstairs facilities. Pt has 1 step to enter through the garage (primary mode of entry), and no steps at the front door. Pt also has a walk in shower, but does not have a shower seat. Pt had four previous balance related falls, separate from this current event and is currently in OPPT for his balance deficits.   Session administered w/ OT 2/2 pt requiring +2 assist for all bed mobility and transfers. Pt needing MaxA+2 to complete supine to sit transfer needing assistance w/ negotiating LLE and trunk along w/ v/c's to utilize bed railing. Pt needing MaxA+2 for sit to stand noting increased LLE pain,decreased LLE WB 'ing and increased WB through bilat UE 's. Pt attempted standing marches noting difficulty lifting LLE from ground. Pt MaxA+2 in standing, with v/c's to attempt lateral stepping towards chair, noting significant increase in time needed to perform movement. Pt noted  increased fatigue during transfer ultimately completing a stand pivot transfer into the chair at bedside MaxA+2. Pt requiring repositioning in chair needing ModA+2 to stand and maintain standing position, to reposition. Pt left seated in chair at end of session w/ all needs met. Pt currently with functional limitations due to the deficits listed below (see PT Problem List). Pt will benefit from skilled PT to increase their independence and safety with mobility to allow discharge to the venue listed below.           If plan is discharge home, recommend the following: Two people to help with bathing/dressing/bathroom;Assistance with cooking/housework;Assist for transportation;Two people to help with walking and/or transfers   Can travel by private vehicle   No    Equipment Recommendations None recommended by PT  Recommendations for Other Services  Rehab consult    Functional Status Assessment Patient has had a recent decline in their functional status and/or demonstrates limited ability to make significant improvements in function in a reasonable and predictable amount of time     Precautions / Restrictions Precautions Precautions: Fall Restrictions Weight Bearing Restrictions: Yes LLE Weight Bearing: Weight bearing as tolerated      Mobility  Bed Mobility Overal bed mobility: Needs Assistance Bed Mobility: Supine to Sit Rolling: Max assist, +2 for physical assistance   Supine to sit: Max assist, +2 for physical assistance     General bed mobility comments: Pt MaxA +2 needing assistance for LLE and trunk negotiation EOB. Patient Response: Cooperative  Transfers Overall transfer  level: Needs assistance Equipment used: Rolling walker (2 wheels)   Sit to Stand: Max assist, +2 safety/equipment           General transfer comment: MaxA +2 for STS needing v/c for proper L foot placement, and 2 person assist to assume standing positioning w/ RW.    Ambulation/Gait                   Stairs            Wheelchair Mobility     Tilt Bed Tilt Bed Patient Response: Cooperative  Modified Rankin (Stroke Patients Only)       Balance Overall balance assessment: Needs assistance, History of Falls Sitting-balance support: Bilateral upper extremity supported     Postural control: Posterior lean Standing balance support: Bilateral upper extremity supported                                 Pertinent Vitals/Pain Pain Assessment Pain Assessment: 0-10 Pain Score: 3  (3/10 when sitting, 7/10 with movement) Pain Location: L hip Pain Intervention(s): Limited activity within patient's tolerance    Home Living Family/patient expects to be discharged to:: Private residence Living Arrangements: Spouse/significant other Available Help at Discharge: Family Type of Home:  (townhouse) Home Access: Stairs to enter   [employer] of Steps: 1 step through garage with grab bars , front door no steps   Home Layout: Multi-level Home Equipment: Rollator (4 wheels);Rolling Walker (2 wheels);Grab bars - tub/shower;Grab bars - toilet;Cane - quad Additional Comments: cane    Prior Function Prior Level of Function : History of Falls (last six months)             Mobility Comments: amb with AD, fall history ADLs Comments: MOD I with ADL, assist with IADL as needed, pt does own med management, wife cooks/cleans     Extremity/Trunk Assessment   Upper Extremity Assessment Upper Extremity Assessment: Generalized weakness    Lower Extremity Assessment Lower Extremity Assessment: LLE deficits/detail LLE Deficits / Details: superior/inferior pubic fractures. DIfficulty mobilizing LLE due to pain LLE: Unable to fully assess due to pain       Communication   Communication Communication: No apparent difficulties Cueing Techniques: Verbal cues;Tactile cues  Cognition                                                 General Comments General comments (skin integrity, edema, etc.): Pt requiring Max A+2 while standing w/ bilat UE support on RW to maintain standing balance.    Exercises General Exercises - Lower Extremity Ankle Circles/Pumps: AROM, 5 reps, Both Short Arc Quad: Both, 5 reps, AROM   Assessment/Plan    PT Assessment Patient needs continued PT services  PT Problem List Decreased strength;Decreased activity tolerance;Decreased balance;Decreased mobility;Pain       PT Treatment Interventions Gait training;Stair training;Functional mobility training;Therapeutic activities;Therapeutic exercise;Balance training;Neuromuscular re-education;Patient/family education    PT Goals (Current goals can be found in the Care Plan section)  Acute Rehab PT Goals Patient Stated Goal: to get better PT Goal Formulation: With patient Time For Goal Achievement: 11/19/22 Potential to Achieve Goals: Good    Frequency Min 1X/week     Co-evaluation PT/OT/SLP Co-Evaluation/Treatment: Yes Reason for Co-Treatment: To address functional/ADL transfers PT goals addressed during session:  Mobility/safety with mobility;Strengthening/ROM         AM-PAC PT "6 Clicks" Mobility  Outcome Measure Help needed turning from your back to your side while in a flat bed without using bedrails?: A Lot Help needed moving from lying on your back to sitting on the side of a flat bed without using bedrails?: A Lot Help needed moving to and from a bed to a chair (including a wheelchair)?: A Lot Help needed standing up from a chair using your arms (e.g., wheelchair or bedside chair)?: A Lot Help needed to walk in hospital room?: Total Help needed climbing 3-5 steps with a railing? : Total 6 Click Score: 10    End of Session Equipment Utilized During Treatment: Gait belt Activity Tolerance: Patient limited by pain Patient left: in chair Nurse Communication: Mobility status;Weight bearing status PT Visit Diagnosis:  Unsteadiness on feet (R26.81);Other abnormalities of gait and mobility (R26.89);Repeated falls (R29.6);Muscle weakness (generalized) (M62.81);History of falling (Z91.81);Difficulty in walking, not elsewhere classified (R26.2);Pain Pain - Right/Left: Left Pain - part of body: Hip    Time: 1610-9604 PT Time Calculation (min) (ACUTE ONLY): 29 min   Charges:   PT Evaluation $PT Eval Moderate Complexity: 1 Mod PT Treatments $Therapeutic Activity: 8-22 mins PT General Charges $$ ACUTE PT VISIT: 1 Visit         Lovie Macadamia, SPT  [facility] Doctor 11/05/2022, 3:57 PM

## 2022-11-05 NOTE — Assessment & Plan Note (Signed)
-   Continue with PPI 

## 2022-11-05 NOTE — Assessment & Plan Note (Signed)
-   Continue with home statin ?

## 2022-11-05 NOTE — Evaluation (Signed)
Occupational Therapy Evaluation Patient Details Name: Kyle Simon MRN: [id number] DOB: [birth year] Today's Date: 11/05/2022   History of Present Illness Pt is an 89 year old male presents to the ER after sustaining mechanical fall, imaging shows left pubic rami fracture;    Pmh significant for type 2 diabetes, BPH, GERD, hyperlipidemia, essential hypertension, hypothyroidism, history of childhood polio   Clinical Impression   Chart reviewed, nurse cleared pt for participation in OT evaluation. Co tx completed with PT. PTA pt is MOD I-I in ADL, assist for IADL as needed, amb with AD with approx 4 falls in the last 6 months per pt report. Pt presents with deficits in strength, endurance, activity tolerance, balance affecting safe and optimal ADL completion. MAX A +2 required for bed mobility, MAX A +2 for initial STS with RW, pt able to progress to MOD A +2 with RW. Pt unable to take steps, eventual SPT to bedside chair with MAX A +2. MAX A required for LB dressing/ADLs, set up for feeding and grooming tasks. Pt will benefit from ongoing OT to address deficits, will continue eto follow to facilitate return to PLOF.       If plan is discharge home, recommend the following: A lot of help with walking and/or transfers;A lot of help with bathing/dressing/bathroom;Assistance with cooking/housework;Help with stairs or ramp for entrance;Assist for transportation    Functional Status Assessment  Patient has had a recent decline in their functional status and demonstrates the ability to make significant improvements in function in a reasonable and predictable amount of time.  Equipment Recommendations  None recommended by OT (per next venue of care)    Recommendations for Other Services       Precautions / Restrictions Precautions Precautions: Fall Restrictions Weight Bearing Restrictions: Yes LLE Weight Bearing: Weight bearing as tolerated      Mobility Bed Mobility Overal bed mobility:  Needs Assistance Bed Mobility: Supine to Sit Rolling: Max assist, +2 for physical assistance   Supine to sit: Max assist, +2 for physical assistance     General bed mobility comments: frequent multi modal cues for technqiue    Transfers Overall transfer level: Needs assistance Equipment used: Rolling walker (2 wheels) Transfers: Sit to/from Stand, Bed to chair/wheelchair/BSC Sit to Stand: Max assist, +2 safety/equipment, progress to MOD A +2 with RW  Stand pivot transfers: +2 physical assistance, From elevated surface, Max assist         General transfer comment: frequent multi modal cues for technique, put unable to progress L foot, requires SPT after unable to take steps      Balance Overall balance assessment: Needs assistance, History of Falls Sitting-balance support: Bilateral upper extremity supported Sitting balance-Leahy Scale: Good   Postural control: Posterior lean Standing balance support: Bilateral upper extremity supported Standing balance-Leahy Scale: Poor                             ADL either performed or assessed with clinical judgement   ADL Overall ADL's : Needs assistance/impaired                                       General ADL Comments: SET UP for feeding/grooming tasks, MAX A for LB dressing (socks); MAX A +2 for simulated bsc transfer to bedside chair     Vision Patient Visual Report: No change from baseline  Perception         Praxis         Pertinent Vitals/Pain Pain Assessment Pain Assessment: 0-10 Pain Score: 3  Pain Location: L hip Pain Descriptors / Indicators: Aching, Discomfort Pain Intervention(s): Limited activity within patient's tolerance, Repositioned, Premedicated before session, Monitored during session     Extremity/Trunk Assessment Upper Extremity Assessment Upper Extremity Assessment: Generalized weakness   Lower Extremity Assessment Lower Extremity Assessment: LLE  deficits/detail;Defer to PT evaluation LLE Deficits / Details: superior/inferior pubic fractures. DIfficulty mobilizing LLE due to pain LLE: Unable to fully assess due to pain       Communication Communication Communication: No apparent difficulties Cueing Techniques: Verbal cues;Tactile cues;Visual cues   Cognition Arousal: Alert Behavior During Therapy: WFL for tasks assessed/performed Overall Cognitive Status: Within Functional Limits for tasks assessed                                       General Comments  vss throughout    Exercises Other Exercises Other Exercises: edu re: role of OT, role of rehab, discharge recommendations, safe AE use for ADLs   Shoulder Instructions      Home Living Family/patient expects to be discharged to:: Private residence Living Arrangements: Spouse/significant other Available Help at Discharge: Family Type of Home:  (townhouse) Home Access: Stairs to enter [employer] of Steps: 1 step through garage with grab bars , front door no steps   Home Layout: Multi-level     Bathroom Shower/Tub: Producer, television/film/video: Handicapped height Bathroom Accessibility: Yes How Accessible: Accessible via walker Home Equipment: Rollator (4 wheels);Rolling Walker (2 wheels);Grab bars - tub/shower;Grab bars - toilet;Cane - quad   Additional Comments: cane      Prior Functioning/Environment Prior Level of Function : History of Falls (last six months)             Mobility Comments: amb with AD, fall historu ADLs Comments: MOD I with ADL, assist with IADL as needed, pt does own med management, wife cooks/cleans        OT Problem List: Decreased strength;Decreased knowledge of use of DME or AE;Decreased activity tolerance;Impaired balance (sitting and/or standing);Decreased safety awareness      OT Treatment/Interventions: Self-care/ADL training;Balance training;Therapeutic exercise;Therapeutic activities;DME  and/or AE instruction;Patient/family education    OT Goals(Current goals can be found in the care plan section) Acute Rehab OT Goals Patient Stated Goal: return to PLOF OT Goal Formulation: With patient Time For Goal Achievement: 11/19/22 Potential to Achieve Goals: Good ADL Goals Pt Will Perform Grooming: with modified independence;sitting;standing Pt Will Perform Lower Body Dressing: with modified independence;sit to/from stand Pt Will Transfer to Toilet: with modified independence;ambulating Pt Will Perform Toileting - Clothing Manipulation and hygiene: with modified independence;sitting/lateral leans  OT Frequency: Min 1X/week    Co-evaluation PT/OT/SLP Co-Evaluation/Treatment: Yes Reason for Co-Treatment: For patient/therapist safety;To address functional/ADL transfers PT goals addressed during session: Mobility/safety with mobility;Strengthening/ROM OT goals addressed during session: ADL's and self-care      AM-PAC OT "6 Clicks" Daily Activity     Outcome Measure Help from another person eating meals?: None Help from another person taking care of personal grooming?: None Help from another person toileting, which includes using toliet, bedpan, or urinal?: A Lot Help from another person bathing (including washing, rinsing, drying)?: A Lot Help from another person to put on and taking off regular upper body clothing?: A Little  Help from another person to put on and taking off regular lower body clothing?: A Lot 6 Click Score: 17   End of Session Equipment Utilized During Treatment: Gait belt;Rolling walker (2 wheels) Nurse Communication: Mobility status  Activity Tolerance: Patient tolerated treatment well Patient left: in chair;with call bell/phone within reach;with chair alarm set  OT Visit Diagnosis: Other abnormalities of gait and mobility (R26.89);Muscle weakness (generalized) (M62.81)                Time: 1337-1410 OT Time Calculation (min): 33 min Charges:  OT  General Charges $OT Visit: 1 Visit OT Evaluation $OT Eval Moderate Complexity: 1 Mod  Oleta Mouse, OTD OTR/L  11/05/22, 4:41 PM

## 2022-11-05 NOTE — Plan of Care (Signed)
  Problem: Education: Goal: Ability to describe self-care measures that may prevent or decrease complications (Diabetes Survival Skills Education) will improve Outcome: Progressing Goal: Individualized Educational Video(s) Outcome: Progressing   Problem: Nutritional: Goal: Maintenance of adequate nutrition will improve Outcome: Progressing Goal: Progress toward achieving an optimal weight will improve Outcome: Progressing   Problem: Skin Integrity: Goal: Risk for impaired skin integrity will decrease Outcome: Progressing   Problem: Education: Goal: Knowledge of General Education information will improve Description: Including pain rating scale, medication(s)/side effects and non-pharmacologic comfort measures Outcome: Progressing   Problem: Activity: Goal: Risk for activity intolerance will decrease Outcome: Progressing   Problem: Nutrition: Goal: Adequate nutrition will be maintained Outcome: Progressing   Problem: Pain Managment: Goal: General experience of comfort will improve Outcome: Progressing   Problem: Safety: Goal: Ability to remain free from injury will improve Outcome: Progressing

## 2022-11-05 NOTE — Assessment & Plan Note (Signed)
Left pubic rami fracture.  Patient has left mildly displaced pubic rami fracture secondary to mechanical fall. Orthopedic surgery was consulted from ED and they were recommending conservative management pain control and PT evaluation. -Continue with pain management -PT/OT evaluation

## 2022-11-05 NOTE — Assessment & Plan Note (Signed)
Diet control at home. -SSI with CBG monitoring

## 2022-11-05 NOTE — TOC Initial Note (Signed)
Transition of Care [facility]) - Initial/Assessment Note    Patient Details  Name: Kyle Simon MRN: [id number] Date of Birth: 1932/05/15  Transition of Care [facility]) CM/SW Contact:    Liliana Cline, LCSW Phone Number: 11/05/2022, 5:12 PM  Clinical Narrative:                 CSW met with patient and patient's spouse June at bedside. Patient is from home with wife.  Patient and wife are agreeable to SNF recommendation from PT and OT. They prefer [facility] or [facility].  SNF work up started. Sue Lush at [facility] states she should be able to accept patient after qualifying 3 night stay. Updated June by phone.   Expected Discharge Plan: Skilled Nursing Facility Barriers to Discharge: Continued Medical Work up   Patient Goals and CMS Choice Patient states their goals for this hospitalization and ongoing recovery are:: SNF CMS Medicare.gov Compare Post Acute Care list provided to:: Patient Choice offered to / list presented to : Patient, Spouse [place] ownership interest in [facility].provided to:: Patient    Expected Discharge Plan and Services       Living arrangements for the past 2 months: Single Family Home                                      Prior Living Arrangements/Services Living arrangements for the past 2 months: Single Family Home Lives with:: Spouse Patient language and need for interpreter reviewed:: Yes Do you feel safe going back to the place where you live?: Yes      Need for Family Participation in Patient Care: Yes (Comment) Care giver support system in place?: Yes (comment) Current home services: DME Criminal Activity/Legal Involvement Pertinent to Current Situation/Hospitalization: No - Comment as needed  Activities of Daily Living Home Assistive Devices/Equipment: Cane (specify quad or straight) ADL Screening (condition at time of admission) Patient's cognitive ability adequate to safely complete daily activities?:  Yes Is the patient deaf or have difficulty hearing?: No Does the patient have difficulty seeing, even when wearing glasses/contacts?: No Does the patient have difficulty concentrating, remembering, or making decisions?: No Patient able to express need for assistance with ADLs?: Yes Does the patient have difficulty dressing or bathing?: Yes Independently performs ADLs?: No Communication: Independent Dressing (OT): Needs assistance Is this a change from baseline?: Change from baseline, expected to last <3days Grooming: Independent Feeding: Independent Bathing: Needs assistance Is this a change from baseline?: Change from baseline, expected to last <3 days Toileting: Needs assistance Is this a change from baseline?: Change from baseline, expected to last <3 days In/Out Bed: Needs assistance Is this a change from baseline?: Change from baseline, expected to last <3 days Walks in Home: Independent with device (comment) Does the patient have difficulty walking or climbing stairs?: No Weakness of Legs: Left Weakness of Arms/Hands: None  Permission Sought/Granted Permission sought to share information with : Facility Industrial/product designer granted to share information with : Yes, Verbal Permission Granted     Permission granted to share info w AGENCY: SNFs        Emotional Assessment       Orientation: : Oriented to Self, Oriented to Place, Oriented to  Time, Oriented to Situation Alcohol / Substance Use: Not Applicable Psych Involvement: No (comment)  Admission diagnosis:  Fall [W19.XXXA] Fall, initial encounter [id number]] Closed fracture of multiple pubic  rami, initial encounter [facility]) [S32.599A] Patient Active Problem List   Diagnosis Date Noted   Fall 11/04/2022   Quadriceps weakness 08/17/2022   Muscle spasm 08/17/2022   Obstruction of sinonasal region 07/13/2022   Chronic pansinusitis 07/13/2022   Chronic pain of right lower extremity 07/08/2022   Elevated  fecal calprotectin 05/06/2022   Chronic diarrhea 04/27/2022   Tinnitus of both ears 04/09/2022   Poor dentition 04/02/2022   Disorder of bone density and structure, unspecified 01/15/2022   Memory loss 11/26/2021   Balance problems 12/17/2020   Chronic pain of left ankle 12/17/2020   Dyslipidemia 11/30/2020   Hyperkalemia 02/05/2020   Chronic synovitis 01/18/2020   Thoracic aortic aneurysm (HCC) 12/10/2019   SIADH (syndrome of inappropriate ADH production) (HCC) 03/21/2019   Elevated coronary artery calcium score 11/25/2018   Aortic atherosclerosis (HCC) 10/05/2017   Lethargy 08/22/2017   Chronic pain of left knee 08/22/2017   Lumbar radiculopathy 06/16/2017   DM assoc with CKD (chronic kidney disease), stage III (HCC) 04/14/2017   Arthralgia of right temporomandibular joint 03/02/2017   Chronic eczematous otitis externa of both ears 03/02/2017   Presbycusis of both ears 03/02/2017   ETD (Eustachian tube dysfunction), right 09/15/2016   Trochanteric bursitis of right hip 09/07/2016   Age-related osteoporosis without current pathological fracture 07/16/2016   Hypomagnesemia 12/29/2015   Constipation 08/06/2015   Type 2 diabetes mellitus with other specified complication (HCC) 08/05/2015   Complete tear of right rotator cuff 12/03/2014   Chronic rhinitis 11/01/2014   Hypertension associated with diabetes (HCC) 03/20/2014   Spondylolisthesis of lumbar region 10/23/2013   Diverticulosis of colon without hemorrhage 01/22/2013   Anemia, unspecified 08/13/2012   GERD (gastroesophageal reflux disease) 03/26/2011   SENILE KERATOSIS 06/12/2008   Hyperlipidemia associated with type 2 diabetes mellitus (HCC) 04/19/2007   BPH (benign prostatic hyperplasia) 04/18/2007   Hypothyroidism 04/20/2006   PCP:  Mort Sawyers, FNP Pharmacy:   [facility] [phone] - [name], [place] - [address] [place] [address] Phone: [phone] Fax: [phone]     Social  Determinants of Health (SDOH) Social History: SDOH Screenings   Food Insecurity: No Food Insecurity (11/05/2022)  Housing: Low Risk  (11/05/2022)  Transportation Needs: No Transportation Needs (11/05/2022)  Utilities: Not At Risk (11/05/2022)  Alcohol Screen: Low Risk  (09/09/2022)  Depression (PHQ2-9): Low Risk  (09/09/2022)  Recent Concern: Depression (PHQ2-9) - Medium Risk (08/17/2022)  Financial Resource Strain: Low Risk  (09/09/2022)  Physical Activity: Sufficiently Active (09/09/2022)  Social Connections: Socially Integrated (09/09/2022)  Stress: No Stress Concern Present (09/09/2022)  Tobacco Use: Medium Risk (11/04/2022)  Health Literacy: Adequate Health Literacy (09/09/2022)   SDOH Interventions:     Readmission Risk Interventions     No data to display

## 2022-11-06 DIAGNOSIS — E039 Hypothyroidism, unspecified: Secondary | ICD-10-CM | POA: Diagnosis not present

## 2022-11-06 DIAGNOSIS — S32592A Other specified fracture of left pubis, initial encounter for closed fracture: Secondary | ICD-10-CM | POA: Diagnosis not present

## 2022-11-06 DIAGNOSIS — E1169 Type 2 diabetes mellitus with other specified complication: Secondary | ICD-10-CM | POA: Diagnosis not present

## 2022-11-06 DIAGNOSIS — W19XXXA Unspecified fall, initial encounter: Secondary | ICD-10-CM | POA: Diagnosis not present

## 2022-11-06 LAB — GLUCOSE, CAPILLARY
Glucose-Capillary: 112 mg/dL — ABNORMAL HIGH (ref 70–99)
Glucose-Capillary: 120 mg/dL — ABNORMAL HIGH (ref 70–99)
Glucose-Capillary: 136 mg/dL — ABNORMAL HIGH (ref 70–99)
Glucose-Capillary: 99 mg/dL (ref 70–99)

## 2022-11-06 NOTE — Progress Notes (Signed)
Physical Therapy Treatment Patient Details Name: Kyle Simon MRN: [id number] DOB: [birth year] Today's Date: 11/06/2022   History of Present Illness Pt is an 89 year old male presents to the ER after sustaining mechanical fall, imaging shows left pubic rami fracture;    Pmh significant for type 2 diabetes, BPH, GERD, hyperlipidemia, essential hypertension, hypothyroidism, history of childhood polio    PT Comments  Pt is making gradual progress with upper trunk control in sitting and standing.  PT treatment focused on working on trunk control and core strengthening with weight shifting activities (lateral, A/P) cueing of posture, midline awareness, and abdominal activation in sitting and standing (with RW Min-Mod A +2 for safety).  Pt continues to need MaxA +2 for bed mobility and transfers due to severe pain with LLE movement and difficulty coordinating body mechanics.  Pt will benefit from skilled PT to increase their independence and safety with mobility to allow discharge to the venue listed below.     If plan is discharge home, recommend the following: Two people to help with bathing/dressing/bathroom;Assistance with cooking/housework;Assist for transportation;Two people to help with walking and/or transfers   Can travel by private vehicle     No  Equipment Recommendations  None recommended by PT    Recommendations for Other Services Rehab consult     Precautions / Restrictions Precautions Precautions: Fall Restrictions Weight Bearing Restrictions: Yes LLE Weight Bearing: Weight bearing as tolerated     Mobility  Bed Mobility Overal bed mobility: Needs Assistance Bed Mobility: Supine to Sit Rolling: Max assist, +2 for physical assistance   Supine to sit: Max assist, +2 for physical assistance     General bed mobility comments: difficulty coordinating body mechanics and severe painful movement with LLE.    Transfers Overall transfer level: Needs assistance Equipment  used: Rolling walker (2 wheels) Transfers: Sit to/from Stand, Bed to chair/wheelchair/BSC Sit to Stand: Max assist, +2 safety/equipment     Squat pivot transfers: +2 physical assistance, Max assist     General transfer comment: frequent multi modal cues for technique, put unable to progress L foot, requires suat pivot after unable to take steps    Ambulation/Gait               General Gait Details: unable to weight shift sufficiently to advance R LE.   Stairs             Wheelchair Mobility     Tilt Bed    Modified Rankin (Stroke Patients Only)       Balance Overall balance assessment: Needs assistance, History of Falls Sitting-balance support: Bilateral upper extremity supported Sitting balance-Leahy Scale: Good Sitting balance - Comments: worked on trunk control and core strengthening with weight shift activities (lateral, A/P) cueing of posture, midline awareness, and abdominal activation. Postural control: Posterior lean Standing balance support: Bilateral upper extremity supported Standing balance-Leahy Scale: Poor                              Cognition Arousal: Alert Behavior During Therapy: WFL for tasks assessed/performed Overall Cognitive Status: Within Functional Limits for tasks assessed                                          Exercises      General Comments        Pertinent Vitals/Pain  Pain Assessment Pain Score: 8  Pain Location: L hip Pain Descriptors / Indicators: Aching, Discomfort Pain Intervention(s): Limited activity within patient's tolerance, Monitored during session    Home Living                          Prior Function            PT Goals (current goals can now be found in the care plan section) Acute Rehab PT Goals Patient Stated Goal: to get better PT Goal Formulation: With patient Time For Goal Achievement: 11/19/22 Potential to Achieve Goals: Good Progress towards PT  goals: Progressing toward goals    Frequency    Min 1X/week      PT Plan      Co-evaluation              AM-PAC PT "6 Clicks" Mobility   Outcome Measure  Help needed turning from your back to your side while in a flat bed without using bedrails?: A Lot Help needed moving from lying on your back to sitting on the side of a flat bed without using bedrails?: A Lot Help needed moving to and from a bed to a chair (including a wheelchair)?: A Lot Help needed standing up from a chair using your arms (e.g., wheelchair or bedside chair)?: A Lot Help needed to walk in hospital room?: Total Help needed climbing 3-5 steps with a railing? : Total 6 Click Score: 10    End of Session Equipment Utilized During Treatment: Gait belt   Patient left: in chair;with chair alarm set Nurse Communication: Mobility status;Weight bearing status PT Visit Diagnosis: Unsteadiness on feet (R26.81);Other abnormalities of gait and mobility (R26.89);Repeated falls (R29.6);Muscle weakness (generalized) (M62.81);History of falling (Z91.81);Difficulty in walking, not elsewhere classified (R26.2);Pain Pain - Right/Left: Left Pain - part of body: Hip     Time: [id number] PT Time Calculation (min) (ACUTE ONLY): 55 min  Charges:    $Therapeutic Activity: 53-67 mins PT General Charges $$ ACUTE PT VISIT: 1 Visit                     Hortencia Conradi, PTA  11/06/22, 11:39 AM

## 2022-11-06 NOTE — Progress Notes (Signed)
Patient's daughter states that the patient seems more confused/forgetful than baseline. He is oriented to person, place, time disoriented to situation unless coaxed into explaining why he is here, like "what he was doing and that he fell". He has received Norco since being here. Daughter says all day he has been more forgetful.   RN notified Manuela Schwartz, NP of the above information.

## 2022-11-06 NOTE — Progress Notes (Signed)
Mobility Specialist - Progress Note   11/06/22 1501  Mobility  Activity Transferred from chair to bed;Stood at bedside  Level of Assistance +2 (takes two people)  Press photographer wheel walker;Other (Comment) Secondary school teacher)  Distance Ambulated (ft) 0 ft  LLE Weight Bearing WBAT  Activity Response Tolerated well  Mobility Referral Yes  $Mobility charge 1 Mobility  Mobility Specialist Start Time (ACUTE ONLY) 0108  Mobility Specialist Stop Time (ACUTE ONLY) 0139  Mobility Specialist Time Calculation (min) (ACUTE ONLY) 31 min   Pt sitting in recliner on RA requesting to return to bed. Pt STS MODA +2 but unable to shift weight or take steps toward bed. Hoyer Lift used to transfer from [employer] to bed. Pt left in bed with needs in reach, bed alarm activated, and wife present.   Terrilyn Saver  Mobility Specialist  11/06/22 3:03 PM

## 2022-11-06 NOTE — NC FL2 (Signed)
[place] MEDICAID FL2 LEVEL OF CARE FORM     IDENTIFICATION  Patient Name: Kyle Simon Birthdate: 08-24-1932 Sex: male Admission Date (Current Location): 11/04/2022  [facility] and [place] Number:  Chiropodist and Address:  [facility], [address], [place], [place] [address]      Provider Number: [id number]  Attending Physician Name and Address:  Arnetha Courser, MD  Relative Name and Phone Number:  Remy, Kihm (Spouse)  [phone] (Home Phone)    Current Level of Care: Hospital Recommended Level of Care: Skilled Nursing Facility Prior Approval Number:    Date Approved/Denied:   PASRR Number: [id number]  Discharge Plan:      Current Diagnoses: Patient Active Problem List   Diagnosis Date Noted   Fall 11/04/2022   Quadriceps weakness 08/17/2022   Muscle spasm 08/17/2022   Obstruction of sinonasal region 07/13/2022   Chronic pansinusitis 07/13/2022   Chronic pain of right lower extremity 07/08/2022   Elevated fecal calprotectin 05/06/2022   Chronic diarrhea 04/27/2022   Tinnitus of both ears 04/09/2022   Poor dentition 04/02/2022   Disorder of bone density and structure, unspecified 01/15/2022   Memory loss 11/26/2021   Balance problems 12/17/2020   Chronic pain of left ankle 12/17/2020   Dyslipidemia 11/30/2020   Hyperkalemia 02/05/2020   Chronic synovitis 01/18/2020   Thoracic aortic aneurysm (HCC) 12/10/2019   SIADH (syndrome of inappropriate ADH production) (HCC) 03/21/2019   Elevated coronary artery calcium score 11/25/2018   Aortic atherosclerosis (HCC) 10/05/2017   Lethargy 08/22/2017   Chronic pain of left knee 08/22/2017   Lumbar radiculopathy 06/16/2017   DM assoc with CKD (chronic kidney disease), stage III (HCC) 04/14/2017   Arthralgia of right temporomandibular joint 03/02/2017   Chronic eczematous otitis externa of both ears 03/02/2017   Presbycusis of both ears 03/02/2017   ETD (Eustachian tube  dysfunction), right 09/15/2016   Trochanteric bursitis of right hip 09/07/2016   Age-related osteoporosis without current pathological fracture 07/16/2016   Hypomagnesemia 12/29/2015   Constipation 08/06/2015   Type 2 diabetes mellitus with other specified complication (HCC) 08/05/2015   Complete tear of right rotator cuff 12/03/2014   Chronic rhinitis 11/01/2014   Hypertension associated with diabetes (HCC) 03/20/2014   Spondylolisthesis of lumbar region 10/23/2013   Diverticulosis of colon without hemorrhage 01/22/2013   Anemia, unspecified 08/13/2012   GERD (gastroesophageal reflux disease) 03/26/2011   SENILE KERATOSIS 06/12/2008   Hyperlipidemia associated with type 2 diabetes mellitus (HCC) 04/19/2007   BPH (benign prostatic hyperplasia) 04/18/2007   Hypothyroidism 04/20/2006    Orientation RESPIRATION BLADDER Height & Weight     Self, Time, Situation, Place  Normal   Weight: 187 lb 6.3 oz (85 kg) Height:  5\' 10"  (177.8 cm)  BEHAVIORAL SYMPTOMS/MOOD NEUROLOGICAL BOWEL NUTRITION STATUS        Diet (Carb modified)  AMBULATORY STATUS COMMUNICATION OF NEEDS Skin   Extensive Assist   Skin abrasions, Bruising                       Personal Care Assistance Level of Assistance  Bathing, Feeding, Dressing Bathing Assistance: Maximum assistance Feeding assistance: Limited assistance Dressing Assistance: Maximum assistance     Functional Limitations Info             SPECIAL CARE FACTORS FREQUENCY  PT (By licensed PT), OT (By licensed OT)     PT Frequency: 5 times per week OT Frequency: 5 times per week  Contractures      Additional Factors Info  Code Status, Allergies Code Status Info: (DNR) PRE-ARREST INTERVENTIONS DESIRED Allergies Info: Simvastatin, Oxycodone           Current Medications (11/06/2022):  This is the current hospital active medication list Current Facility-Administered Medications  Medication Dose Route Frequency Provider  Last Rate Last Admin   enoxaparin (LOVENOX) injection 40 mg  40 mg Subcutaneous Q24H Mikeal Hawthorne, Mohammad L, MD   40 mg at 11/05/22 2314   Fe Fum-Vit C-Vit B12-FA (TRIGELS-F FORTE) capsule 1 capsule  1 capsule Oral QPC breakfast Arnetha Courser, MD   1 capsule at 11/06/22 0808   finasteride (PROSCAR) tablet 5 mg  5 mg Oral Daily Earlie Lou L, MD   5 mg at 11/06/22 1610   fludrocortisone (FLORINEF) tablet 0.1 mg  0.1 mg Oral Q M,W,F-1800 Arnetha Courser, MD   0.1 mg at 11/05/22 1753   fluticasone (FLONASE) 50 MCG/ACT nasal spray 2 spray  2 spray Each Nare Daily Arnetha Courser, MD   2 spray at 11/06/22 0811   HYDROcodone-acetaminophen (NORCO/VICODIN) 5-325 MG per tablet 1 tablet  1 tablet Oral Q4H PRN Rometta Emery, MD   1 tablet at 11/05/22 1049   insulin aspart (novoLOG) injection 0-15 Units  0-15 Units Subcutaneous TID WC Garba, Mohammad L, MD       insulin aspart (novoLOG) injection 0-5 Units  0-5 Units Subcutaneous QHS Mikeal Hawthorne, Mohammad L, MD       irbesartan (AVAPRO) tablet 300 mg  300 mg Oral Daily Earlie Lou L, MD   300 mg at 11/06/22 0807   ketorolac (TORADOL) 15 MG/ML injection 15 mg  15 mg Intravenous Q6H PRN Rometta Emery, MD       levothyroxine (SYNTHROID) tablet 137 mcg  137 mcg Oral QAC breakfast Rometta Emery, MD   137 mcg at 11/06/22 0558   lidocaine (LIDODERM) 5 % 1 patch  1 patch Transdermal Q12H Arnetha Courser, MD   1 patch at 11/05/22 1338   lipase/protease/amylase (CREON) capsule 36,000 Units  36,000 Units Oral TID AC Rometta Emery, MD   36,000 Units at 11/06/22 0808   ondansetron (ZOFRAN) tablet 4 mg  4 mg Oral Q6H PRN Rometta Emery, MD       Or   ondansetron (ZOFRAN) injection 4 mg  4 mg Intravenous Q6H PRN Garba, Mohammad L, MD       polyethylene glycol (MIRALAX / GLYCOLAX) packet 17 g  17 g Oral Daily PRN Earlie Lou L, MD   17 g at 11/06/22 0558   polyvinyl alcohol (LIQUIFILM TEARS) 1.4 % ophthalmic solution 1 drop  1 drop Both Eyes BID Arnetha Courser,  MD   1 drop at 11/06/22 0811   rosuvastatin (CRESTOR) tablet 5 mg  5 mg Oral QHS Earlie Lou L, MD   5 mg at 11/05/22 2315   tamsulosin (FLOMAX) capsule 0.4 mg  0.4 mg Oral BID Rometta Emery, MD   0.4 mg at 11/06/22 0807     Discharge Medications: Please see discharge summary for a list of discharge medications.  Relevant Imaging Results:  Relevant Lab Results:   Additional Information SS #: [id number]  Christain Niznik E Shere Eisenhart, LCSW

## 2022-11-06 NOTE — Progress Notes (Signed)
  Progress Note   Patient: Kyle Simon QBH:[id number] DOB: Aug 03, 1932 DOA: 11/04/2022     2 DOS: the patient was seen and examined on 11/06/2022   Brief hospital course: Taken from H&P.   CHAZ MENNA is a 89 y.o. male with medical history significant of type 2 diabetes, BPH, GERD, hyperlipidemia, essential hypertension, hypothyroidism, history of childhood polio who presents to the ER after sustaining mechanical fall.   He was found to have left superior and inferior pubic rami fractures. No other appreciable fractures. Orthopedic was consulted from ER and they were recommending nonweightbearing for the night.  Conservative management.  Admitted for pain management.  9/6: Vital stable.  Labs with mild hyponatremia which seems stable and chronic.  Some decrease in hemoglobin to 10.2.  Starting on supplement. Pending PT and OT evaluation.  9/7: Hemodynamically stable, PT and OT recommending rehab   Assessment and Plan: * Fall Left pubic rami fracture.  Patient has left mildly displaced pubic rami fracture secondary to mechanical fall. Orthopedic surgery was consulted from ED and they were recommending conservative management pain control and PT evaluation. -Continue with pain management -PT/OT are recommending SNF  Type 2 diabetes mellitus with other specified complication (HCC) Diet control at home. -SSI with CBG monitoring  Hypothyroidism -Continue home Synthroid  BPH (benign prostatic hyperplasia) -Continue tamsulosin  Hyperlipidemia associated with type 2 diabetes mellitus (HCC) -Continue with home statin  GERD (gastroesophageal reflux disease) -Continue with PPI   Subjective: Patient was seen and examined today.  Unable to bear weight, otherwise no pain while resting.  Physical Exam: Vitals:   11/05/22 2219 11/06/22 0119 11/06/22 0449 11/06/22 0743  BP: (!) 164/79 (!) 156/82 (!) 157/91 (!) 142/76  Pulse: 76 81 82 78  Resp:    18  Temp:  98.2 F (36.8 C) 99.8  F (37.7 C) 98.3 F (36.8 C)  TempSrc:  Oral  Oral  SpO2: 93% 94% 94% 95%  Weight:      Height:       General.  Well-developed elderly man, in no acute distress. Pulmonary.  Lungs clear bilaterally, normal respiratory effort. CV.  Regular rate and rhythm, no JVD, rub or murmur. Abdomen.  Soft, nontender, nondistended, BS positive. CNS.  Alert and oriented .  No focal neurologic deficit. Extremities.  No edema, no cyanosis, pulses intact and symmetrical. Psychiatry.  Judgment and insight appears normal.   Data Reviewed: Prior data reviewed  Family Communication: Discussed with wife at bedside  Disposition: Status is: Inpatient Remains inpatient appropriate because: Severity of illness, need pain control  Planned Discharge Destination:  To be determined  DVT prophylaxis.  Lovenox Time spent: 42 minutes  This record has been created using Conservation officer, historic buildings. Errors have been sought and corrected,but may not always be located. Such creation errors do not reflect on the standard of care.   Author: Arnetha Courser, MD 11/06/2022 3:10 PM  For on call review [email or web address].

## 2022-11-06 NOTE — Plan of Care (Signed)

## 2022-11-06 NOTE — Progress Notes (Signed)
Patient's family would like him to have SCDs ordered since he not as mobile as usual.  RN notified Manuela Schwartz, NP of the above information  NP states "his risk factors for clots and he has chronic edema I can't safely order SCDs and he is on lovenox "

## 2022-11-06 NOTE — Assessment & Plan Note (Signed)
Left pubic rami fracture.  Patient has left mildly displaced pubic rami fracture secondary to mechanical fall. Orthopedic surgery was consulted from ED and they were recommending conservative management pain control and PT evaluation. -Continue with pain management -PT/OT are recommending SNF

## 2022-11-06 NOTE — Progress Notes (Signed)
Patient still oriented x4, he knows his age unsure of the month, no headache no dizziness. Left arm drifting upwards while eyes are closed. Patient says he is having sensation changes but he is changing his answers due to not wanting to be bothered. He is unable to lift Left leg due to fractured Rami. Was unable to hold R leg off of the bed due to pelvic pain as well. BP 164/79 previous was 154/74 and he is denying pain while lying still. CBG 127. Poor attention/concentration. Wife said his mentation has been getting worse since lunch time. Patient is not aphasic, no slurred speech. So true last known well is this afternoon for mentation.   Head CT 9/5 shows "1. No acute intracranial abnormality. 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 Unsure if day team was aware of the change in status of unable to feed self today   RN notified Manuela Schwartz, NP of the above information.

## 2022-11-07 DIAGNOSIS — E1169 Type 2 diabetes mellitus with other specified complication: Secondary | ICD-10-CM | POA: Diagnosis not present

## 2022-11-07 DIAGNOSIS — W19XXXA Unspecified fall, initial encounter: Secondary | ICD-10-CM | POA: Diagnosis not present

## 2022-11-07 DIAGNOSIS — S32592A Other specified fracture of left pubis, initial encounter for closed fracture: Secondary | ICD-10-CM | POA: Diagnosis not present

## 2022-11-07 DIAGNOSIS — E039 Hypothyroidism, unspecified: Secondary | ICD-10-CM | POA: Diagnosis not present

## 2022-11-07 LAB — GLUCOSE, CAPILLARY
Glucose-Capillary: 114 mg/dL — ABNORMAL HIGH (ref 70–99)
Glucose-Capillary: 100 mg/dL — ABNORMAL HIGH (ref 70–99)
Glucose-Capillary: 120 mg/dL — ABNORMAL HIGH (ref 70–99)

## 2022-11-07 MED ORDER — MAGNESIUM HYDROXIDE 400 MG/5ML PO SUSP: 30.0000 mL | Freq: Every day | ORAL | Status: DC | PRN

## 2022-11-07 NOTE — Plan of Care (Signed)
  Problem: Education: Goal: Ability to describe self-care measures that may prevent or decrease complications (Diabetes Survival Skills Education) will improve Outcome: Progressing Goal: Individualized Educational Video(s) Outcome: Progressing   Problem: Fluid Volume: Goal: Ability to maintain a balanced intake and output will improve Outcome: Progressing   Problem: Health Behavior/Discharge Planning: Goal: Ability to identify and utilize available resources and services will improve Outcome: Progressing Goal: Ability to manage health-related needs will improve Outcome: Progressing   Problem: Metabolic: Goal: Ability to maintain appropriate glucose levels will improve Outcome: Progressing   Problem: Nutritional: Goal: Maintenance of adequate nutrition will improve Outcome: Progressing   Problem: Nutritional: Goal: Maintenance of adequate nutrition will improve Outcome: Progressing   Problem: Nutritional: Goal: Maintenance of adequate nutrition will improve Outcome: Progressing Goal: Progress toward achieving an optimal weight will improve Outcome: Progressing   Problem: Skin Integrity: Goal: Risk for impaired skin integrity will decrease Outcome: Progressing   Problem: Tissue Perfusion: Goal: Adequacy of tissue perfusion will improve Outcome: Progressing   Problem: Education: Goal: Knowledge of General Education information will improve Description: Including pain rating scale, medication(s)/side effects and non-pharmacologic comfort measures Outcome: Progressing   Problem: Clinical Measurements: Goal: Ability to maintain clinical measurements within normal limits will improve Outcome: Progressing Goal: Will remain free from infection Outcome: Progressing Goal: Diagnostic test results will improve Outcome: Progressing Goal: Respiratory complications will improve Outcome: Progressing Goal: Cardiovascular complication will be avoided Outcome: Progressing    Problem: Pain Managment: Goal: General experience of comfort will improve Outcome: Progressing   Problem: Elimination: Goal: Will not experience complications related to bowel motility Outcome: Progressing Goal: Will not experience complications related to urinary retention Outcome: Progressing   Problem: Safety: Goal: Ability to remain free from injury will improve Outcome: Progressing   Problem: Skin Integrity: Goal: Risk for impaired skin integrity will decrease Outcome: Progressing

## 2022-11-07 NOTE — Progress Notes (Signed)
  Progress Note   Patient: Kyle Simon ZOX:[id number] DOB: January 21, 1933 DOA: 11/04/2022     3 DOS: the patient was seen and examined on 11/07/2022   Brief hospital course: Taken from H&P.   Kyle Simon is a 89 y.o. male with medical history significant of type 2 diabetes, BPH, GERD, hyperlipidemia, essential hypertension, hypothyroidism, history of childhood polio who presents to the ER after sustaining mechanical fall.   He was found to have left superior and inferior pubic rami fractures. No other appreciable fractures. Orthopedic was consulted from ER and they were recommending nonweightbearing for the night.  Conservative management.  Admitted for pain management.  9/6: Vital stable.  Labs with mild hyponatremia which seems stable and chronic.  Some decrease in hemoglobin to 10.2.  Starting on supplement. Pending PT and OT evaluation.  9/7: Hemodynamically stable, PT and OT recommending rehab.  9/8: Medically stable-awaiting SNF placement   Assessment and Plan: * Fall Left pubic rami fracture.  Patient has left mildly displaced pubic rami fracture secondary to mechanical fall. Orthopedic surgery was consulted from ED and they were recommending conservative management pain control and PT evaluation. -Continue with pain management -PT/OT are recommending SNF  Type 2 diabetes mellitus with other specified complication (HCC) Diet control at home. -SSI with CBG monitoring  Hypothyroidism -Continue home Synthroid  BPH (benign prostatic hyperplasia) -Continue tamsulosin  Hyperlipidemia associated with type 2 diabetes mellitus (HCC) -Continue with home statin  GERD (gastroesophageal reflux disease) -Continue with PPI   Subjective: Pain seems well-controlled with current regimen, per patient some improvement in mobility.  Physical Exam: Vitals:   11/06/22 1643 11/06/22 2100 11/07/22 0501 11/07/22 0843  BP: (!) 147/70 (!) 166/67 (!) 164/76 (!) 162/73  Pulse: 64 65 65 69   Resp: 18 18 16 20   Temp: 98 F (36.7 C) 98.8 F (37.1 C) 98.2 F (36.8 C) 98 F (36.7 C)  TempSrc:   Oral   SpO2: 96% 98% 94% 98%  Weight:      Height:       General.  Well-developed elderly man, in no acute distress. Pulmonary.  Lungs clear bilaterally, normal respiratory effort. CV.  Regular rate and rhythm, no JVD, rub or murmur. Abdomen.  Soft, nontender, nondistended, BS positive. CNS.  Alert and oriented .  No focal neurologic deficit. Extremities.  No edema, no cyanosis, pulses intact and symmetrical. Psychiatry.  Judgment and insight appears normal.   Data Reviewed: Prior data reviewed  Family Communication: Discussed with patient  Disposition: Status is: Inpatient Remains inpatient appropriate because: Severity of illness, need pain control  Planned Discharge Destination: SNF  DVT prophylaxis.  Lovenox Time spent: 40 minutes  This record has been created using Conservation officer, historic buildings. Errors have been sought and corrected,but may not always be located. Such creation errors do not reflect on the standard of care.   Author: Arnetha Courser, MD 11/07/2022 1:06 PM  For on call review [email or web address].

## 2022-11-08 DIAGNOSIS — S32810A Multiple fractures of pelvis with stable disruption of pelvic ring, initial encounter for closed fracture: Secondary | ICD-10-CM | POA: Diagnosis not present

## 2022-11-08 DIAGNOSIS — I152 Hypertension secondary to endocrine disorders: Secondary | ICD-10-CM | POA: Diagnosis not present

## 2022-11-08 DIAGNOSIS — J310 Chronic rhinitis: Secondary | ICD-10-CM | POA: Diagnosis not present

## 2022-11-08 DIAGNOSIS — Z9181 History of falling: Secondary | ICD-10-CM | POA: Diagnosis not present

## 2022-11-08 DIAGNOSIS — K219 Gastro-esophageal reflux disease without esophagitis: Secondary | ICD-10-CM | POA: Diagnosis not present

## 2022-11-08 DIAGNOSIS — M6281 Muscle weakness (generalized): Secondary | ICD-10-CM | POA: Diagnosis not present

## 2022-11-08 DIAGNOSIS — I10 Essential (primary) hypertension: Secondary | ICD-10-CM | POA: Diagnosis not present

## 2022-11-08 DIAGNOSIS — E871 Hypo-osmolality and hyponatremia: Secondary | ICD-10-CM | POA: Diagnosis not present

## 2022-11-08 DIAGNOSIS — E785 Hyperlipidemia, unspecified: Secondary | ICD-10-CM | POA: Diagnosis not present

## 2022-11-08 DIAGNOSIS — E039 Hypothyroidism, unspecified: Secondary | ICD-10-CM | POA: Diagnosis not present

## 2022-11-08 DIAGNOSIS — S32592K Other specified fracture of left pubis, subsequent encounter for fracture with nonunion: Secondary | ICD-10-CM | POA: Diagnosis not present

## 2022-11-08 DIAGNOSIS — R278 Other lack of coordination: Secondary | ICD-10-CM | POA: Diagnosis not present

## 2022-11-08 DIAGNOSIS — S32592D Other specified fracture of left pubis, subsequent encounter for fracture with routine healing: Secondary | ICD-10-CM | POA: Diagnosis not present

## 2022-11-08 DIAGNOSIS — N400 Enlarged prostate without lower urinary tract symptoms: Secondary | ICD-10-CM | POA: Diagnosis not present

## 2022-11-08 DIAGNOSIS — E1159 Type 2 diabetes mellitus with other circulatory complications: Secondary | ICD-10-CM | POA: Diagnosis not present

## 2022-11-08 DIAGNOSIS — I712 Thoracic aortic aneurysm, without rupture, unspecified: Secondary | ICD-10-CM | POA: Diagnosis not present

## 2022-11-08 DIAGNOSIS — R350 Frequency of micturition: Secondary | ICD-10-CM | POA: Diagnosis not present

## 2022-11-08 DIAGNOSIS — Z743 Need for continuous supervision: Secondary | ICD-10-CM | POA: Diagnosis not present

## 2022-11-08 DIAGNOSIS — M8000XK Age-related osteoporosis with current pathological fracture, unspecified site, subsequent encounter for fracture with nonunion: Secondary | ICD-10-CM | POA: Diagnosis not present

## 2022-11-08 DIAGNOSIS — W19XXXA Unspecified fall, initial encounter: Secondary | ICD-10-CM | POA: Diagnosis not present

## 2022-11-08 DIAGNOSIS — N401 Enlarged prostate with lower urinary tract symptoms: Secondary | ICD-10-CM | POA: Diagnosis not present

## 2022-11-08 DIAGNOSIS — S32512D Fracture of superior rim of left pubis, subsequent encounter for fracture with routine healing: Secondary | ICD-10-CM | POA: Diagnosis not present

## 2022-11-08 DIAGNOSIS — R102 Pelvic and perineal pain: Secondary | ICD-10-CM | POA: Diagnosis not present

## 2022-11-08 DIAGNOSIS — I129 Hypertensive chronic kidney disease with stage 1 through stage 4 chronic kidney disease, or unspecified chronic kidney disease: Secondary | ICD-10-CM | POA: Diagnosis not present

## 2022-11-08 DIAGNOSIS — I700 Atherosclerosis of aorta: Secondary | ICD-10-CM | POA: Diagnosis not present

## 2022-11-08 DIAGNOSIS — N183 Chronic kidney disease, stage 3 unspecified: Secondary | ICD-10-CM | POA: Diagnosis not present

## 2022-11-08 DIAGNOSIS — Z8612 Personal history of poliomyelitis: Secondary | ICD-10-CM | POA: Diagnosis not present

## 2022-11-08 DIAGNOSIS — K5909 Other constipation: Secondary | ICD-10-CM | POA: Diagnosis not present

## 2022-11-08 DIAGNOSIS — R2689 Other abnormalities of gait and mobility: Secondary | ICD-10-CM | POA: Diagnosis not present

## 2022-11-08 DIAGNOSIS — E1169 Type 2 diabetes mellitus with other specified complication: Secondary | ICD-10-CM | POA: Diagnosis not present

## 2022-11-08 LAB — GLUCOSE, CAPILLARY
Glucose-Capillary: 103 mg/dL — ABNORMAL HIGH (ref 70–99)
Glucose-Capillary: 129 mg/dL — ABNORMAL HIGH (ref 70–99)

## 2022-11-08 MED ORDER — FE FUM-VIT C-VIT B12-FA 460-60-0.01-1 MG PO CAPS: 1.0000 | ORAL_CAPSULE | Freq: Every day | ORAL | Status: DC

## 2022-11-08 MED ORDER — HYDROCODONE-ACETAMINOPHEN 5-325 MG PO TABS: 1.0000 | ORAL_TABLET | ORAL | 0 refills | Status: DC | PRN

## 2022-11-08 MED ORDER — DICLOFENAC SODIUM 50 MG PO TBEC: 50.0000 mg | DELAYED_RELEASE_TABLET | Freq: Two times a day (BID) | ORAL | Status: DC | PRN

## 2022-11-08 MED ORDER — MAGNESIUM HYDROXIDE 400 MG/5ML PO SUSP: 30.0000 mL | Freq: Every day | ORAL | Status: DC | PRN

## 2022-11-08 NOTE — Consult Note (Signed)
[facility] Customer service manager [facility]) Accountable Care Organization (ACO) [facility] Liaison Note  11/08/2022  [name] Jul 22, 1932 [id number]  Location: [facility] RN Hospital Liaison screened the patient remotely at [facility].  Insurance:  Medicare   Kyle Simon is a 89 y.o. male who is a Primary Care Patient of Mort Sawyers, FNP. The patient was screened for readmission hospitalization with noted low risk score for unplanned readmission risk with 1 IP in 6 months.  The patient was assessed for potential [facility]) Care Management service needs for post hospital transition for care coordination. Review of patient's electronic medical record reveals patient was admitted for Fall. Pt discharging to SNF level of care for ongoing STR. Facility will continue to manage pt's ongoing needs. Will alert the PAC-RN on pt's disposition.  [facility] Care Management/Population Health does not replace or interfere with any arrangements made by the Inpatient Transition of Care team.   For questions contact:   Elliot Cousin, RN, [facility] Liaison [place]   Population Health Office Hours MTWF  8:00 am-6:00 pm [phone] mobile [phone] [Office toll free line] Office Hours are M-F 8:30 - 5 pm [email or web address]

## 2022-11-08 NOTE — Group Note (Deleted)

## 2022-11-08 NOTE — Care Management Important Message (Signed)
Important Message  Patient Details  Name: Kyle Simon MRN: [id number] Date of Birth: 06-19-1932   Medicare Important Message Given:  N/A - LOS <3 / Initial given by admissions     Olegario Messier A Tychelle Purkey 11/08/2022, 9:43 AM

## 2022-11-08 NOTE — Discharge Summary (Signed)
Physician Discharge Summary   Patient: Kyle Simon MRN: [id number] DOB: 05-05-1932  Admit date:     11/04/2022  Discharge date: 11/08/22  Discharge Physician: Arnetha Courser   PCP: Mort Sawyers, FNP   Recommendations at discharge:  Please obtain CBC and BMP in 1 week Follow-up with orthopedic surgery Follow-up with primary care provider Patient will be weightbearing as tolerated for pubic rami fracture. Please try Voltaren tablets before giving you Norco for pain  Discharge Diagnoses: Principal Problem:   Fall Active Problems:   Type 2 diabetes mellitus with other specified complication (HCC)   Hypothyroidism   BPH (benign prostatic hyperplasia)   Hyperlipidemia associated with type 2 diabetes mellitus (HCC)   GERD (gastroesophageal reflux disease)   Hospital Course: Taken from H&P.   Kyle Simon is a 89 y.o. male with medical history significant of type 2 diabetes, BPH, GERD, hyperlipidemia, essential hypertension, hypothyroidism, history of childhood polio who presents to the ER after sustaining mechanical fall.   He was found to have left superior and inferior pubic rami fractures. No other appreciable fractures. Orthopedic was consulted from ER and they were recommending nonweightbearing for the night.  Conservative management.  Admitted for pain management.  9/6: Vital stable.  Labs with mild hyponatremia which seems stable and chronic.  Some decrease in hemoglobin to 10.2.  Starting on supplement. Pending PT and OT evaluation.  9/7: Hemodynamically stable, PT and OT recommending rehab.  9/8: Medically stable-awaiting SNF placement  9/9: Remain medically stable.  Is being discharged to [facility] for rehab.  Patient is being given Voltaren tablets to be used first and Norco only if Voltaren does not work.  He will continue the rest of his home medications and follow-up with his providers for further management.  Assessment and Plan: * Fall Left pubic rami fracture.   Patient has left mildly displaced pubic rami fracture secondary to mechanical fall. Orthopedic surgery was consulted from ED and they were recommending conservative management pain control and PT evaluation. -Continue with pain management -PT/OT are recommending SNF  Type 2 diabetes mellitus with other specified complication (HCC) Diet control at home. -SSI with CBG monitoring  Hypothyroidism -Continue home Synthroid  BPH (benign prostatic hyperplasia) -Continue tamsulosin  Hyperlipidemia associated with type 2 diabetes mellitus (HCC) -Continue with home statin  GERD (gastroesophageal reflux disease) -Continue with PPI   Pain control - [place] Controlled Substance Reporting System database was reviewed. and patient was instructed, not to drive, operate heavy machinery, perform activities at heights, swimming or participation in water activities or provide baby-sitting services while on Pain, Sleep and Anxiety Medications; until their outpatient Physician has advised to do so again. Also recommended to not to take more than prescribed Pain, Sleep and Anxiety Medications.  Consultants: Orthopedic surgery Procedures performed: None Disposition: Skilled nursing facility Diet recommendation:  Discharge Diet Orders (From admission, onward)     Start     Ordered   11/08/22 0000  Diet - low sodium heart healthy        11/08/22 1003           Cardiac and Carb modified diet DISCHARGE MEDICATION: Allergies as of 11/08/2022       Reactions   Simvastatin Other (See Comments)   MYALGIAS   Oxycodone Other (See Comments)   Severe constipation        Medication List     STOP taking these medications    amoxicillin-clavulanate 875-125 MG tablet Commonly known as: AUGMENTIN   aspirin  81 MG tablet   celecoxib 200 MG capsule Commonly known as: CELEBREX       TAKE these medications    ammonium lactate 12 % lotion Commonly known as: Amlactin Daily Apply 1  Application topically as needed for dry skin.   ARTIFICIAL TEARS OP Place 1 drop into both eyes 2 (two) times daily.   Clobetasol Propionate E 0.05 % emollient cream Generic drug: Clobetasol Prop Emollient Base as needed.   diclofenac 50 MG EC tablet Commonly known as: VOLTAREN Take 1 tablet (50 mg total) by mouth 2 (two) times daily as needed for mild pain or moderate pain.   diclofenac Sodium 1 % Gel Commonly known as: VOLTAREN Apply 2 g topically at bedtime.   Fe Fum-Vit C-Vit B12-FA Caps capsule Commonly known as: TRIGELS-F FORTE Take 1 capsule by mouth daily after breakfast. Start taking on: November 09, 2022   finasteride 5 MG tablet Commonly known as: PROSCAR Take 5 mg by mouth daily.   Fish Oil 1000 MG Caps Take 1,000 mg by mouth every evening.   fludrocortisone 0.1 MG tablet Commonly known as: FLORINEF 1 tablet 3 days a week   fluticasone 50 MCG/ACT nasal spray Commonly known as: FLONASE Place 2 sprays into both nostrils daily.   HYDROcodone-acetaminophen 5-325 MG tablet Commonly known as: NORCO/VICODIN Take 1 tablet by mouth every 4 (four) hours as needed for moderate pain.   lidocaine 5 % Commonly known as: Lidoderm Place 1 patch onto the skin every 12 (twelve) hours. Remove & Discard patch within 12 hours or as directed by MD   lipase/protease/amylase 16109 UNITS Cpep capsule Commonly known as: Creon Take 1 capsule (36,000 Units total) by mouth 3 (three) times daily before meals.   magnesium hydroxide 400 MG/5ML suspension Commonly known as: MILK OF MAGNESIA Take 30 mLs by mouth daily as needed for mild constipation.   mometasone 0.1 % ointment Commonly known as: ELOCON Apply topically daily.   polyethylene glycol 17 g packet Commonly known as: MiraLax Take 17 g by mouth daily as needed for mild constipation or moderate constipation.   rosuvastatin 5 MG tablet Commonly known as: Crestor Take 1 tablet (5 mg total) by mouth at bedtime.    Synthroid 137 MCG tablet Generic drug: levothyroxine TAKE 1 TABLET DAILY BEFORE BREAKFAST   tamsulosin 0.4 MG Caps capsule Commonly known as: FLOMAX Take 1 capsule (0.4 mg total) by mouth 2 (two) times daily.   valsartan 320 MG tablet Commonly known as: DIOVAN Take 1 tablet (320 mg total) by mouth daily.        Follow-up Information     Mort Sawyers, FNP. Schedule an appointment as soon as possible for a visit in 1 week(s).   Specialty: Family Medicine Contact information: [address] [place] [address] [phone]         Signa Kell, MD. Schedule an appointment as soon as possible for a visit.   Specialty: Orthopedic Surgery Contact information: [address] [place] [address] [phone]                Discharge Exam: Filed Weights   11/04/22 1156  Weight: 85 kg   General.  Well-developed gentleman, in no acute distress. Pulmonary.  Lungs clear bilaterally, normal respiratory effort. CV.  Regular rate and rhythm, no JVD, rub or murmur. Abdomen.  Soft, nontender, nondistended, BS positive. CNS.  Alert and oriented .  No focal neurologic deficit. Extremities.  No edema, no cyanosis, pulses intact  and symmetrical. Psychiatry.  Judgment and insight appears normal.   Condition at discharge: stable  The results of significant diagnostics from this hospitalization (including imaging, microbiology, ancillary and laboratory) are listed below for reference.   Imaging Studies: CT Head Wo Contrast  Result Date: 11/04/2022 CLINICAL DATA:  Head trauma, moderate-severe fall, hit head; Polytrauma, blunt EXAM: CT HEAD WITHOUT CONTRAST CT CERVICAL SPINE WITHOUT CONTRAST TECHNIQUE: Multidetector CT imaging of the head and cervical spine was performed following the standard protocol without intravenous contrast. Multiplanar CT image reconstructions of the cervical spine were also generated. RADIATION DOSE REDUCTION: This exam was performed  according to the departmental dose-optimization program which includes automated exposure control, adjustment of the mA and/or kV according to patient size and/or use of iterative reconstruction technique. COMPARISON:  CT Head 07/10/22 FINDINGS: CT HEAD FINDINGS Brain: No evidence of acute infarction, hemorrhage, hydrocephalus, extra-axial collection or mass lesion/mass effect. 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 Vascular: No hyperdense vessel or unexpected calcification. Skull: Normal. Negative for fracture or focal lesion. Sinuses/Orbits: No middle ear or mastoid effusion. OMC pattern left sided sinusitis. Bilateral lens replacement. Orbits are otherwise unremarkable. Other: None. CT CERVICAL SPINE FINDINGS Alignment: Normal. Skull base and vertebrae: No acute fracture. No primary bone lesion or focal pathologic process. Soft tissues and spinal canal: No prevertebral fluid or swelling. No visible canal hematoma. Disc levels:  No evidence of high-grade spinal canal stenosis. Upper chest: Negative. Other: Soft tissue lipoma along the midline posterior back. IMPRESSION: 1. No acute intracranial abnormality. 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 2. No acute fracture or traumatic malalignment of the cervical spine. Electronically Signed   By: Lorenza Cambridge M.D.   On: 11/04/2022 16:08   CT Cervical Spine Wo Contrast  Result Date: 11/04/2022 CLINICAL DATA:  Head trauma, moderate-severe fall, hit head; Polytrauma, blunt EXAM: CT HEAD WITHOUT CONTRAST CT CERVICAL SPINE WITHOUT CONTRAST TECHNIQUE: Multidetector CT imaging of the head and cervical spine was performed following the standard protocol without intravenous contrast. Multiplanar CT image reconstructions of the  cervical spine were also generated. RADIATION DOSE REDUCTION: This exam was performed according to the departmental dose-optimization program which includes automated exposure control, adjustment of the mA and/or kV according to patient size and/or use of iterative reconstruction technique. COMPARISON:  CT Head 07/10/22 FINDINGS: CT HEAD FINDINGS Brain: No evidence of acute infarction, hemorrhage, hydrocephalus, extra-axial collection or mass lesion/mass effect. 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 Vascular: No hyperdense vessel or unexpected calcification. Skull: Normal. Negative for fracture or focal lesion. Sinuses/Orbits: No middle ear or mastoid effusion. OMC pattern left sided sinusitis. Bilateral lens replacement. Orbits are otherwise unremarkable. Other: None. CT CERVICAL SPINE FINDINGS Alignment: Normal. Skull base and vertebrae: No acute fracture. No primary bone lesion or focal pathologic process. Soft tissues and spinal canal: No prevertebral fluid or swelling. No visible canal hematoma. Disc levels:  No evidence of high-grade spinal canal stenosis. Upper chest: Negative. Other: Soft tissue lipoma along the midline posterior back. IMPRESSION: 1. No acute intracranial abnormality. 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 2.  No acute fracture or traumatic malalignment of the cervical spine. Electronically Signed   By: Lorenza Cambridge M.D.   On: 11/04/2022 16:08   DG Lumbar Spine 2-3 Views  Result Date: 11/04/2022 CLINICAL DATA:  Fall with back pain.  Hip pain. EXAM: LUMBAR SPINE - 2-3 VIEW COMPARISON:  None Available. FINDINGS: Cross-table lateral views are limited due to overlying artifact. Postsurgical change at L3-L4 and L4-L5 with intact hardware. There is no  evidence of acute fracture. Moderate degenerative disc disease at T12-L1, L1-L2, and L2-L3. The posterior elements are not well assessed. IMPRESSION: 1. No evidence of acute fracture of the lumbar spine. Limited posterior element assessment. 2. Postsurgical change at L3-L4 and L4-L5 with intact hardware. Electronically Signed   By: Narda Rutherford M.D.   On: 11/04/2022 15:47   DG Knee Complete 4 Views Left  Result Date: 11/04/2022 CLINICAL DATA:  Fall with left knee pain. EXAM: LEFT KNEE - COMPLETE 4+ VIEW COMPARISON:  None Available. FINDINGS: Left knee arthroplasty. No acute or periprosthetic fracture. No periprosthetic lucency. There has been patellar resurfacing. Minimal knee joint effusion. No erosive change. Unremarkable soft tissues. IMPRESSION: Left knee arthroplasty without complication or acute fracture. Electronically Signed   By: Narda Rutherford M.D.   On: 11/04/2022 15:46   DG Shoulder Left  Result Date: 11/04/2022 CLINICAL DATA:  Status post fall with left shoulder pain. EXAM: LEFT SHOULDER - 2+ VIEW COMPARISON:  None Available. FINDINGS: There is no evidence of fracture or dislocation. Minor acromioclavicular and glenohumeral degenerative change. No erosions. The included ribs are intact. Soft tissues are unremarkable. IMPRESSION: No fracture or dislocation of the left shoulder. Minor degenerative change. Electronically Signed   By: Narda Rutherford M.D.   On: 11/04/2022 15:44   DG Ankle 2 Views Left  Result Date: 11/04/2022 CLINICAL DATA:  fall.  Left leg pain. EXAM: LEFT ANKLE - 2 VIEW COMPARISON:  None Available. FINDINGS: No acute fracture or dislocation. No aggressive osseous lesion. Ankle mortise appears intact. There is mild asymmetric soft tissue swelling overlying the lateral malleolus without discrete underlying fracture, favoring soft tissue/ligamentous injury. No radiopaque foreign bodies. IMPRESSION: 1. Lateral soft tissue swelling. No acute fracture or dislocation.  Electronically Signed   By: Jules Schick M.D.   On: 11/04/2022 14:50   DG Hip Unilat With Pelvis 2-3 Views Left  Result Date: 11/04/2022 CLINICAL DATA:  fall, hip pain. EXAM: DG HIP (WITH OR WITHOUT PELVIS) 2-3V LEFT COMPARISON:  CT scan abdomen and pelvis from 09/06/2020. FINDINGS: There is mildly displaced fracture of the left superior and inferior pubic rami, which is of indeterminate age. The fracture margins are not well visualized therefore dating of the fracture is limited; however favored acute. These fractures are new since the prior study from 09/06/2020. Correlate with physical examination for tenderness. Pelvis is intact with normal and symmetric sacroiliac joints. No aggressive osseous lesion. Visualized sacral arcuate lines are unremarkable. Unremarkable symphysis pubis. There are moderate degenerative changes of bilateral hip joints in the form of reduced joint space and osteophytosis. No radiopaque foreign bodies. Partially seen lower lumbar spinal fixation hardware. There are multiple metallic anchors overlying the right lower pelvis, likely from prior hernia repair. IMPRESSION: 1. Mildly displaced fracture of the left superior and inferior pubic rami, of indeterminate age. Correlate with physical examination. 2. Moderate bilateral hip osteoarthritis. Electronically Signed   By: Jules Schick M.D.   On: 11/04/2022 14:47    Microbiology: Results for orders placed or performed in visit on 04/07/22  Calprotectin, Fecal     Status: Abnormal   Collection Time: 04/07/22 11:37 PM  Result Value Ref Range Status   Calprotectin, Fecal 991 (H) 0 - 120 ug/g Final    Comment: **Results verified by repeat testing** Concentration     Interpretation   Follow-Up < 5 - 50 ug/g     Normal           None >50 -120 ug/g     Borderline       Re-evaluate in 4-6 weeks     >120 ug/g     Abnormal         Repeat as clinically                                    indicated     Labs: CBC: Recent Labs  Lab  11/04/22 1803 11/05/22 0505  WBC 9.7 6.1  NEUTROABS 7.7  --   HGB 11.0* 10.2*  HCT 33.7* 30.6*  MCV 93.1 91.9  PLT 195 196   Basic Metabolic Panel: Recent Labs  Lab 11/04/22 1803 11/05/22 0505  NA 132* 133*  K 4.0 4.3  CL 98 100  CO2 24 26  GLUCOSE 131* 116*  BUN 16 14  CREATININE 0.78 0.79  CALCIUM 8.9 8.3*   Liver Function Tests: Recent Labs  Lab 11/04/22 1803 11/05/22 0505  AST 21 17  ALT 16 14  ALKPHOS 72 64  BILITOT 1.0 0.7  PROT 6.8 6.3*  ALBUMIN 3.8 3.5   CBG: Recent Labs  Lab 11/06/22 2102 11/07/22 1224 11/07/22 1733 11/07/22 2019 11/08/22 0729  GLUCAP 99 114* 100* 120* 103*    Discharge time spent: greater than 30 minutes.  This record has been created using Conservation officer, historic buildings. Errors have been sought and corrected,but may not always be located. Such creation errors do not reflect on the standard of care.   Signed: Arnetha Courser, MD [facility] 11/08/2022

## 2022-11-08 NOTE — TOC Transition Note (Addendum)
Transition of Care [facility]) - CM/SW Discharge Note   Patient Details  Name: Kyle Simon MRN: [id number] Date of Birth: August 11, 1932  Transition of [facility] CM/SW Contact:  Allena Katz, LCSW Phone Number: 11/08/2022, 10:23 AM   Clinical Narrative:   Pt discharging today to [facility]. DC summary sent. RN given number for report. Medical necessity printed to unit. CSW informed wife that pt is first on list to be moved. DNR signed and on chart   Final next level of care: Skilled Nursing Facility Barriers to Discharge: Barriers Resolved   Patient Goals and CMS Choice CMS Medicare.gov Compare Post Acute Care list provided to:: Patient Choice offered to / list presented to : Patient  Discharge Placement PASRR number recieved: 11/05/22 PASRR number recieved: 11/05/22            Patient chooses bed at: [facility] Patient to be transferred to facility by: [facility] Name of family member notified: June Patient and family notified of of transfer: 11/08/22  Discharge Plan and Services Additional resources added to the After Visit Summary for                                       Social Determinants of Health (SDOH) Interventions SDOH Screenings   Food Insecurity: No Food Insecurity (11/05/2022)  Housing: Low Risk  (11/05/2022)  Transportation Needs: No Transportation Needs (11/05/2022)  Utilities: Not At Risk (11/05/2022)  Alcohol Screen: Low Risk  (09/09/2022)  Depression (PHQ2-9): Low Risk  (09/09/2022)  Recent Concern: Depression (PHQ2-9) - Medium Risk (08/17/2022)  Financial Resource Strain: Low Risk  (09/09/2022)  Physical Activity: Sufficiently Active (09/09/2022)  Social Connections: Socially Integrated (09/09/2022)  Stress: No Stress Concern Present (09/09/2022)  Tobacco Use: Medium Risk (11/04/2022)  Health Literacy: Adequate Health Literacy (09/09/2022)     Readmission Risk Interventions     No data to display

## 2022-11-09 ENCOUNTER — Other Ambulatory Visit: Payer: Self-pay | Admitting: Family

## 2022-11-09 ENCOUNTER — Telehealth: Payer: Self-pay | Admitting: Family

## 2022-11-09 DIAGNOSIS — D509 Iron deficiency anemia, unspecified: Secondary | ICD-10-CM

## 2022-11-09 DIAGNOSIS — E871 Hypo-osmolality and hyponatremia: Secondary | ICD-10-CM

## 2022-11-09 NOTE — Telephone Encounter (Addendum)
Kyle Sawyers, FNP  P Dugal Pool Pt recently discharged from hospital and note for recommendations was to repeat labs x one week after discharge.  Please let pt (wife) know that I have put in the orders for repeat blood work and that they can make a lab only appointment x one week to have this repeated. --------------------------------- Spoke with pt's wife, Kyle Simon. She states that the pt is currently at [facility]. Reports that he should be there for about 3-4 weeks.  [facility] and they stated that they can perform these labs, they would just need an order faxed to [phone]. Lab orders have been faxed.

## 2022-11-10 ENCOUNTER — Encounter: Payer: Self-pay | Admitting: Student

## 2022-11-10 ENCOUNTER — Non-Acute Institutional Stay (SKILLED_NURSING_FACILITY): Payer: Self-pay | Admitting: Student

## 2022-11-10 DIAGNOSIS — N183 Chronic kidney disease, stage 3 unspecified: Secondary | ICD-10-CM | POA: Diagnosis not present

## 2022-11-10 DIAGNOSIS — E1159 Type 2 diabetes mellitus with other circulatory complications: Secondary | ICD-10-CM

## 2022-11-10 DIAGNOSIS — R350 Frequency of micturition: Secondary | ICD-10-CM

## 2022-11-10 DIAGNOSIS — N401 Enlarged prostate with lower urinary tract symptoms: Secondary | ICD-10-CM | POA: Diagnosis not present

## 2022-11-10 DIAGNOSIS — K219 Gastro-esophageal reflux disease without esophagitis: Secondary | ICD-10-CM

## 2022-11-10 DIAGNOSIS — S32592K Other specified fracture of left pubis, subsequent encounter for fracture with nonunion: Secondary | ICD-10-CM | POA: Diagnosis not present

## 2022-11-10 DIAGNOSIS — I700 Atherosclerosis of aorta: Secondary | ICD-10-CM | POA: Diagnosis not present

## 2022-11-10 DIAGNOSIS — J310 Chronic rhinitis: Secondary | ICD-10-CM

## 2022-11-10 DIAGNOSIS — E039 Hypothyroidism, unspecified: Secondary | ICD-10-CM

## 2022-11-10 DIAGNOSIS — I712 Thoracic aortic aneurysm, without rupture, unspecified: Secondary | ICD-10-CM

## 2022-11-10 DIAGNOSIS — I152 Hypertension secondary to endocrine disorders: Secondary | ICD-10-CM

## 2022-11-10 DIAGNOSIS — M8000XK Age-related osteoporosis with current pathological fracture, unspecified site, subsequent encounter for fracture with nonunion: Secondary | ICD-10-CM

## 2022-11-10 DIAGNOSIS — E785 Hyperlipidemia, unspecified: Secondary | ICD-10-CM | POA: Diagnosis not present

## 2022-11-10 MED ORDER — HYDROCODONE-ACETAMINOPHEN 5-325 MG PO TABS: 1.0000 | ORAL_TABLET | ORAL | 0 refills | Status: DC | PRN

## 2022-11-10 NOTE — Telephone Encounter (Signed)
Thank you so much Kyle Simon.

## 2022-11-10 NOTE — Progress Notes (Signed)
Provider:  Dr. Earnestine Mealing Location:  Other [place].  Nursing Home Room Number: [facility] 105A Place of Service:  SNF (31)  PCP: Mort Sawyers, FNP Patient Care Team: Mort Sawyers, FNP as PCP - General (Family Medicine) Rollene Rotunda, MD as PCP - Cardiology (Cardiology) Pollyann Savoy, MD as Consulting Physician (Rheumatology) Reather Littler, MD (Inactive) as Consulting Physician (Endocrinology) Sherrie George, MD as Consulting Physician (Ophthalmology) Rene Paci, MD as Consulting Physician (Urology) [facility]  Extended Emergency Contact Information Primary Emergency Contact: Tuch,June Address: [address] [place] [address] [place] of [place] Home Phone: [phone] Mobile Phone: [phone] Relation: Spouse  Code Status: DNR Goals of Care: Advanced Directive information    11/10/2022   11:42 AM  Advanced Directives  Does Patient Have a Medical Advance Directive? Yes  Type of Estate agent of [place];Living will;Out of facility DNR (pink MOST or yellow form)  Does patient want to make changes to medical advance directive? No - Patient declined  Copy of Healthcare Power of Attorney in Chart? Yes - validated most recent copy scanned in chart (See row information)    Chief Complaint  Patient presents with   New Admit To SNF    Admission.     HPI: Patient is a 89 y.o. male seen today for admission to [facility].  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 He can't do as much as he used to in the last 3 years. He fell on a driveway picking up 2 pieces of a bush that he trimmed and he tubmled forward. It was concrete and hit his head, shoulder, hip, knee, and ankle. He couldn't get up. Neighbors helped him up. Private Vehicle to hte hospital. He suffered a pubic rami fractures - on both sides.   This is his 4th fall in the last year. Denies confusion. Pain  is well-controlled and has been improving. He has not had good bowel movements - yesterday was his first one in a week. Increase in nocturia. Denies dysuria. Independent in the home - drives, no assitive device, manages meds. Never needed medication for DM before.   Past Medical History:  Diagnosis Date   Arthritis    both hands;Dr.Deveshwar   Constipation due to pain medication    Diabetes mellitus without complication (HCC)    Type II. Uses no medication. Pt controls with diet and weight   Enlarged prostate    Frequent urination at night    GERD (gastroesophageal reflux disease)    History of noncompliance with medical treatment, presenting hazards to health 11/20/2018   Hyperlipidemia associated with type 2 diabetes mellitus (HCC) 04/19/2007   Qualifier: Diagnosis of  By: Alwyn Ren MD, William     Hypertension    Hypothyroidism    Polio    as a child w/o complications   Snoring    no sleep apnea   Statin declined-  11/20/2018   patient understands risks associated with this decision and has been advised to restart by cardiologist as well as myself several times   Torn meniscus    Transfusion history 2012   post TKR   Past Surgical History:  Procedure Laterality Date   ANTERIOR LAT LUMBAR FUSION Right 11/03/2017   Procedure: Right Lumbar three-four Anterolateral lumbar interbody fusion with lateral plate;  Surgeon: Maeola Harman, MD;  Location: [facility] OR;  Service: Neurosurgery;  Laterality: Right;  BACK SURGERY  2011   hemiarthroplasty 01/11/1999 and 6   CARDIAC CATHETERIZATION  09/2001   negative   COLONOSCOPY     COLONOSCOPY N/A 05/06/2022   Procedure: COLONOSCOPY;  Surgeon: Toney Reil, MD;  Location: [facility] ENDOSCOPY;  Service: Gastroenterology;  Laterality: N/A;   COLONOSCOPY WITH PROPOFOL N/A 05/05/2022   Procedure: COLONOSCOPY WITH PROPOFOL;  Surgeon: Toney Reil, MD;  Location: [facility] ENDOSCOPY;  Service: Gastroenterology;  Laterality: N/A;   EYE SURGERY  2003    R&L cataract removed & IOL- 2003 & 2007,Dr.Jenkins   INGUINAL HERNIA REPAIR  2002   right, Dr. Luan Pulling   JOINT REPLACEMENT  2012   left knee replacement   LIPOMA EXCISION     back   MENISCECTOMY Bilateral    Dr. Thurston Hole   PROSTATE SURGERY  07/2006   for urinary urgency, Dr. Tiana Loft CUFF REPAIR Right October, 2016   sinus & throat surgery-1995  1995   TONSILLECTOMY     TOTAL KNEE ARTHROPLASTY  01/11/2011   Procedure: TOTAL KNEE ARTHROPLASTY;  Surgeon: Raymon Mutton, MD;  Location: [facility] OR;  Service: Orthopedics;  Laterality: Left;   TOTAL KNEE ARTHROPLASTY Right 11/22/2016   Procedure: TOTAL KNEE ARTHROPLASTY;  Surgeon: Dannielle Huh, MD;  Location: [facility] OR;  Service: Orthopedics;  Laterality: Right;   UVULECTOMY  1996   Dr. Lazarus Salines   VASECTOMY     WRIST SURGERY  1959   fracture- right   XI ROBOTIC ASSISTED VENTRAL HERNIA N/A 09/07/2020   Procedure: XI ROBOTIC ASSISTED DIAGNOSTIC LAPAROSCOPY;  Surgeon: Leafy Ro, MD;  Location: [facility] ORS;  Service: General;  Laterality: N/A;    reports that he quit smoking about 56 years ago. His smoking use included cigarettes. He started smoking about 74 years ago. He has a 18 pack-year smoking history. He has never been exposed to tobacco smoke. He has never used smokeless tobacco. He reports current alcohol use of about 4.0 standard drinks of alcohol per week. He reports that he does not use drugs. Social History   Socioeconomic History   Marital status: Married    Spouse name: June   Number of children: 7   Years of education: 2 years college   Highest education level: Not on file  Occupational History   Occupation: retired  Tobacco Use   Smoking status: Former    Current packs/day: 0.00    Average packs/day: 1 pack/day for 18.0 years (18.0 ttl pk-yrs)    Types: Cigarettes    Start date: 03/01/1948    Quit date: 03/01/1966    Years since quitting: 56.7    Passive exposure: Never   Smokeless tobacco: Never  Vaping Use   Vaping  status: Never Used  Substance and Sexual Activity   Alcohol use: Yes    Alcohol/week: 4.0 standard drinks of alcohol    Types: 4 Shots of liquor per week    Comment: social, few drinks a week   Drug use: No   Sexual activity: Yes    Birth control/protection: Post-menopausal, Surgical  Other Topics Concern   Not on file  Social History Narrative   09/20/19   From: [place] [employer] [place]   Living: with wife June (1985)   Work: retired from Company secretary, 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      Family: 5 children (one passed away) 2 stepchildren, 14 grandchildren, 9 great-grandchildren - Museum/gallery conservator and granddaughter nearby otherwise across the country       Enjoys:  fly and play golf, reading      Exercise: walking the dog   Diet: diabetic diet      Safety   Seat belts: Yes    Guns: Yes  and secure   Safe in relationships: Yes    Social Determinants of Health   Financial Resource Strain: Low Risk  (09/09/2022)   Overall Financial Resource Strain (CARDIA)    Difficulty of Paying Living Expenses: Not hard at all  Food Insecurity: No Food Insecurity (11/05/2022)   Hunger Vital Sign    Worried About Running Out of Food in the Last Year: Never true    Ran Out of Food in the Last Year: Never true  Transportation Needs: No Transportation Needs (11/05/2022)   PRAPARE - Administrator, Civil Service (Medical): No    Lack of Transportation (Non-Medical): No  Physical Activity: Sufficiently Active (09/09/2022)   Exercise Vital Sign    Days of Exercise per Week: 3 days    Minutes of Exercise per Session: 60 min  Stress: No Stress Concern Present (09/09/2022)   [employer] of Occupational Health - Occupational Stress Questionnaire    Feeling of Stress : Not at all  Social Connections: Socially Integrated (09/09/2022)   Social Connection and Isolation Panel [NHANES]    Frequency of Communication with Friends and Family: More than three times a week    Frequency  of Social Gatherings with Friends and Family: More than three times a week    Attends Religious Services: More than 4 times per year    Active Member of [employer] or Organizations: Yes    Attends Banker Meetings: Never    Marital Status: Married  Catering manager Violence: Not At Risk (11/05/2022)   Humiliation, Afraid, Rape, and Kick questionnaire    Fear of Current or Ex-Partner: No    Emotionally Abused: No    Physically Abused: No    Sexually Abused: No    Functional Status Survey:    Family History  Problem Relation Age of Onset   Coronary artery disease Mother    Emphysema Mother    Diverticulosis Mother    Thyroid disease Mother    Sudden death Father        accident   Asthma Brother    Bone cancer Brother    Thyroid disease Daughter    Sarcoidosis Son    Diabetes Paternal Grandmother    Anesthesia problems Neg Hx    Hypotension Neg Hx    Malignant hyperthermia Neg Hx    Pseudochol deficiency Neg Hx    Colon cancer Neg Hx     Health Maintenance  Topic Date Due   INFLUENZA VACCINE  09/30/2022   COVID-19 Vaccine (7 - 2023-24 season) 10/31/2022   HEMOGLOBIN A1C  04/02/2023   OPHTHALMOLOGY EXAM  07/29/2023   Medicare Annual Wellness (AWV)  09/09/2023   FOOT EXAM  10/07/2023   DTaP/Tdap/Td (6 - Td or Tdap) 07/23/2028   Pneumonia Vaccine 74+ Years old  Completed   Zoster Vaccines- Shingrix  Completed   HPV VACCINES  Aged Out   Colonoscopy  Discontinued    Allergies  Allergen Reactions   Simvastatin Other (See Comments)    MYALGIAS   Oxycodone Other (See Comments)    Severe constipation    Outpatient Encounter Medications as of 11/10/2022  Medication Sig   ammonium lactate (AMLACTIN DAILY) 12 % lotion Apply 1 Application topically as needed for dry skin.   CLOBETASOL PROPIONATE E 0.05 %  emollient cream as needed.   diclofenac (VOLTAREN) 50 MG EC tablet Take 1 tablet (50 mg total) by mouth 2 (two) times daily as needed for mild pain or moderate  pain.   diclofenac Sodium (VOLTAREN) 1 % GEL Apply 2 g topically at bedtime.   Fe Fum-Vit C-Vit B12-FA (TRIGELS-F FORTE) CAPS capsule Take 1 capsule by mouth daily after breakfast.   finasteride (PROSCAR) 5 MG tablet Take 5 mg by mouth daily.   fludrocortisone (FLORINEF) 0.1 MG tablet 1 tablet 3 days a week   fluticasone (FLONASE) 50 MCG/ACT nasal spray Place 2 sprays into both nostrils daily.   Hypromellose (ARTIFICIAL TEARS OP) Place 1 drop into both eyes 2 (two) times daily.   insulin lispro (HUMALOG) 100 UNIT/ML injection Inject per sliding scale 0-120=0, 121-150=2, 151-200= 3, 201-250= 5, 251-300= 8, 301-350= 11, 351-400= 15, [id number] Notify MD   lidocaine (LIDODERM) 5 % Place 1 patch onto the skin every 12 (twelve) hours. Remove & Discard patch within 12 hours or as directed by MD   lipase/protease/amylase (CREON) 36000 UNITS CPEP capsule Take 1 capsule (36,000 Units total) by mouth 3 (three) times daily before meals.   magnesium hydroxide (MILK OF MAGNESIA) 400 MG/5ML suspension Take 30 mLs by mouth daily as needed for mild constipation.   mometasone (ELOCON) 0.1 % ointment Apply topically daily.   Omega-3 Fatty Acids (FISH OIL) 1000 MG CAPS Take 1,000 mg by mouth every evening.   polyethylene glycol (MIRALAX) 17 g packet Take 17 g by mouth daily as needed for mild constipation or moderate constipation.   rosuvastatin (CRESTOR) 5 MG tablet Take 1 tablet (5 mg total) by mouth at bedtime.   SYNTHROID 137 MCG tablet TAKE 1 TABLET DAILY BEFORE BREAKFAST   tamsulosin (FLOMAX) 0.4 MG CAPS capsule Take 1 capsule (0.4 mg total) by mouth 2 (two) times daily.   valsartan (DIOVAN) 320 MG tablet Take 1 tablet (320 mg total) by mouth daily.   Zinc Oxide (TRIPLE PASTE) 12.8 % ointment Apply 1 Application topically. Every shift.   [DISCONTINUED] HYDROcodone-acetaminophen (NORCO/VICODIN) 5-325 MG tablet Take 1 tablet by mouth every 4 (four) hours as needed for moderate pain.   HYDROcodone-acetaminophen  (NORCO/VICODIN) 5-325 MG tablet Take 1 tablet by mouth every 4 (four) hours as needed for moderate pain.   No facility-administered encounter medications on file as of 11/10/2022.    Review of Systems  Vitals:   11/10/22 1132 11/10/22 1144  BP: (!) 177/81 (!) 144/79  Pulse: 61   Resp: 16   Temp: 97.7 F (36.5 C)   SpO2: 98%   Weight: 185 lb 8 oz (84.1 kg)   Height: 5\' 10"  (1.778 m)    Body mass index is 26.62 kg/m. Physical Exam Vitals reviewed.  Constitutional:      Appearance: Normal appearance.  Cardiovascular:     Rate and Rhythm: Normal rate and regular rhythm.     Pulses: Normal pulses.     Heart sounds: Normal heart sounds.  Pulmonary:     Effort: Pulmonary effort is normal.     Breath sounds: Normal breath sounds.  Abdominal:     General: Abdomen is flat.     Palpations: Abdomen is soft.  Neurological:     General: No focal deficit present.     Mental Status: He is alert and oriented to person, place, and time.     Labs reviewed: Basic Metabolic Panel: Recent Labs    08/17/22 0852 09/27/22 0825 11/04/22 1803 11/05/22 0505  NA  132* 131* 132* 133*  K 4.0 4.5 4.0 4.3  CL 96 97 98 100  CO2 27 26 24 26   GLUCOSE 130* 118* 131* 116*  BUN 15 14 16 14   CREATININE 0.80 0.82 0.78 0.79  CALCIUM 8.6 9.5 8.9 8.3*  MG 1.9  --   --   --    Liver Function Tests: Recent Labs    11/04/22 1803 11/05/22 0505  AST 21 17  ALT 16 14  ALKPHOS 72 64  BILITOT 1.0 0.7  PROT 6.8 6.3*  ALBUMIN 3.8 3.5   No results for input(s): "LIPASE", "AMYLASE" in the last 8760 hours. No results for input(s): "AMMONIA" in the last 8760 hours. CBC: Recent Labs    04/27/22 1130 11/04/22 1803 11/05/22 0505  WBC 5.4 9.7 6.1  NEUTROABS 3.4 7.7  --   HGB 11.7* 11.0* 10.2*  HCT 34.1* 33.7* 30.6*  MCV 92.2 93.1 91.9  PLT 252.0 195 196   Cardiac Enzymes: Recent Labs    08/17/22 0852  CKTOTAL 55   BNP: Invalid input(s): "POCBNP" Lab Results  Component Value Date    HGBA1C 6.4 (A) 09/30/2022   Lab Results  Component Value Date   TSH 0.79 09/27/2022   Lab Results  Component Value Date   VITAMINB12 >1500 (H) 11/26/2021   Lab Results  Component Value Date   FOLATE >20.0 08/22/2017   Lab Results  Component Value Date   IRON 111 06/07/2022   TIBC 339 06/07/2022   FERRITIN 60 06/07/2022    Imaging and Procedures obtained prior to SNF admission: CT Head Wo Contrast  Result Date: 11/04/2022 CLINICAL DATA:  Head trauma, moderate-severe fall, hit head; Polytrauma, blunt EXAM: CT HEAD WITHOUT CONTRAST CT CERVICAL SPINE WITHOUT CONTRAST TECHNIQUE: Multidetector CT imaging of the head and cervical spine was performed following the standard protocol without intravenous contrast. Multiplanar CT image reconstructions of the cervical spine were also generated. RADIATION DOSE REDUCTION: This exam was performed according to the departmental dose-optimization program which includes automated exposure control, adjustment of the mA and/or kV according to patient size and/or use of iterative reconstruction technique. COMPARISON:  CT Head 07/10/22 FINDINGS: CT HEAD FINDINGS Brain: No evidence of acute infarction, hemorrhage, hydrocephalus, extra-axial collection or mass lesion/mass effect. 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 Vascular: No hyperdense vessel or unexpected calcification. Skull: Normal. Negative for fracture or focal lesion. Sinuses/Orbits: No middle ear or mastoid effusion. OMC pattern left sided sinusitis. Bilateral lens replacement. Orbits are otherwise unremarkable. Other: None. CT CERVICAL SPINE FINDINGS Alignment: Normal. Skull base and vertebrae: No acute fracture. No primary bone lesion or focal pathologic process. Soft tissues and spinal canal: No prevertebral fluid or swelling. No visible canal hematoma. Disc levels:  No evidence of  high-grade spinal canal stenosis. Upper chest: Negative. Other: Soft tissue lipoma along the midline posterior back. IMPRESSION: 1. No acute intracranial abnormality. 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 2. No acute fracture or traumatic malalignment of the cervical spine. Electronically Signed   By: Lorenza Cambridge M.D.   On: 11/04/2022 16:08   CT Cervical Spine Wo Contrast  Result Date: 11/04/2022 CLINICAL DATA:  Head trauma, moderate-severe fall, hit head; Polytrauma, blunt EXAM: CT HEAD WITHOUT CONTRAST CT CERVICAL SPINE WITHOUT CONTRAST TECHNIQUE: Multidetector CT imaging of the head  and cervical spine was performed following the standard protocol without intravenous contrast. Multiplanar CT image reconstructions of the cervical spine were also generated. RADIATION DOSE REDUCTION: This exam was performed according to the departmental dose-optimization program which includes automated exposure control, adjustment of the mA and/or kV according to patient size and/or use of iterative reconstruction technique. COMPARISON:  CT Head 07/10/22 FINDINGS: CT HEAD FINDINGS Brain: No evidence of acute infarction, hemorrhage, hydrocephalus, extra-axial collection or mass lesion/mass effect. 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 Vascular: No hyperdense vessel or unexpected calcification. Skull: Normal. Negative for fracture or focal lesion. Sinuses/Orbits: No middle ear or mastoid effusion. OMC pattern left sided sinusitis. Bilateral lens replacement. Orbits are otherwise unremarkable. Other: None. CT CERVICAL SPINE FINDINGS Alignment: Normal. Skull base and vertebrae: No acute fracture. No primary bone lesion or focal pathologic process. Soft tissues and spinal canal: No  prevertebral fluid or swelling. No visible canal hematoma. Disc levels:  No evidence of high-grade spinal canal stenosis. Upper chest: Negative. Other: Soft tissue lipoma along the midline posterior back. IMPRESSION: 1. No acute intracranial abnormality. 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 2. No acute fracture or traumatic malalignment of the cervical spine. Electronically Signed   By: Lorenza Cambridge M.D.   On: 11/04/2022 16:08   DG Lumbar Spine 2-3 Views  Result Date: 11/04/2022 CLINICAL DATA:  Fall with back pain.  Hip pain. EXAM: LUMBAR SPINE - 2-3 VIEW COMPARISON:  None Available. FINDINGS: Cross-table lateral views are limited due to overlying artifact. Postsurgical change at L3-L4 and L4-L5 with intact hardware. There is no evidence of acute fracture. Moderate degenerative disc disease at T12-L1, L1-L2, and L2-L3. The posterior elements are not well assessed. IMPRESSION: 1. No evidence of acute fracture of the lumbar spine. Limited posterior element assessment. 2. Postsurgical change at L3-L4 and L4-L5 with intact hardware. Electronically Signed   By: Narda Rutherford M.D.   On: 11/04/2022 15:47   DG Knee Complete 4 Views Left  Result Date: 11/04/2022 CLINICAL DATA:  Fall with left knee pain. EXAM: LEFT KNEE - COMPLETE 4+ VIEW COMPARISON:  None Available. FINDINGS: Left knee arthroplasty. No acute or periprosthetic fracture. No periprosthetic lucency. There has been patellar resurfacing. Minimal knee joint effusion. No erosive change. Unremarkable soft tissues. IMPRESSION: Left knee arthroplasty without complication or acute fracture. Electronically Signed   By: Narda Rutherford M.D.   On: 11/04/2022 15:46   DG Shoulder Left  Result Date: 11/04/2022 CLINICAL DATA:  Status post fall with left shoulder pain. EXAM: LEFT SHOULDER - 2+ VIEW COMPARISON:  None Available. FINDINGS:  There is no evidence of fracture or dislocation. Minor acromioclavicular and glenohumeral degenerative change. No erosions. The included ribs are intact. Soft tissues are unremarkable. IMPRESSION: No fracture or dislocation of the left shoulder. Minor degenerative change. Electronically Signed   By: Narda Rutherford M.D.   On: 11/04/2022 15:44   DG Ankle 2 Views Left  Result Date: 11/04/2022 CLINICAL DATA:  fall.  Left leg pain. EXAM: LEFT ANKLE - 2 VIEW COMPARISON:  None Available. FINDINGS: No acute fracture or dislocation. No aggressive osseous lesion. Ankle mortise appears intact. There is mild asymmetric soft tissue swelling overlying the lateral malleolus without discrete underlying fracture, favoring soft tissue/ligamentous injury. No radiopaque foreign bodies. IMPRESSION: 1. Lateral soft  tissue swelling. No acute fracture or dislocation. Electronically Signed   By: Jules Schick M.D.   On: 11/04/2022 14:50   DG Hip Unilat With Pelvis 2-3 Views Left  Result Date: 11/04/2022 CLINICAL DATA:  fall, hip pain. EXAM: DG HIP (WITH OR WITHOUT PELVIS) 2-3V LEFT COMPARISON:  CT scan abdomen and pelvis from 09/06/2020. FINDINGS: There is mildly displaced fracture of the left superior and inferior pubic rami, which is of indeterminate age. The fracture margins are not well visualized therefore dating of the fracture is limited; however favored acute. These fractures are new since the prior study from 09/06/2020. Correlate with physical examination for tenderness. Pelvis is intact with normal and symmetric sacroiliac joints. No aggressive osseous lesion. Visualized sacral arcuate lines are unremarkable. Unremarkable symphysis pubis. There are moderate degenerative changes of bilateral hip joints in the form of reduced joint space and osteophytosis. No radiopaque foreign bodies. Partially seen lower lumbar spinal fixation hardware. There are multiple metallic anchors overlying the right lower pelvis, likely from  prior hernia repair. IMPRESSION: 1. Mildly displaced fracture of the left superior and inferior pubic rami, of indeterminate age. Correlate with physical examination. 2. Moderate bilateral hip osteoarthritis. Electronically Signed   By: Jules Schick M.D.   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 Pain is well-controlled. Conservative, non-operative management. Pain well-controlled. Continue bowel regimen to help prevention of constipatoin. BPH symptoms persistent despite proscar and flomax. Continue to monitor. Continue flonase. BP slightly elevated, patient thinks it is due to transition and would like to hold off on further BP medication changes at this time. Continue levothyroxine. Continue Vitamin supplementation. Continue Statin therapy. Avoid nephrotoxic medications where possible. GOC at this time DNR, would prefer to avoid hospital if possible.   Family/ staff Communication: nursing  Labs/tests ordered:CBC, BMP, Mg   I spent greater than 60  minutes for the care of this patient in face to face time, chart review, clinical documentation, patient education. I spent an additional 16 minutes discussing  goals of care and advanced care planning.

## 2022-11-22 ENCOUNTER — Non-Acute Institutional Stay (SKILLED_NURSING_FACILITY): Payer: Self-pay | Admitting: Student

## 2022-11-22 DIAGNOSIS — E1159 Type 2 diabetes mellitus with other circulatory complications: Secondary | ICD-10-CM | POA: Diagnosis not present

## 2022-11-22 DIAGNOSIS — N400 Enlarged prostate without lower urinary tract symptoms: Secondary | ICD-10-CM

## 2022-11-22 DIAGNOSIS — N183 Chronic kidney disease, stage 3 unspecified: Secondary | ICD-10-CM | POA: Diagnosis not present

## 2022-11-22 DIAGNOSIS — E785 Hyperlipidemia, unspecified: Secondary | ICD-10-CM

## 2022-11-22 DIAGNOSIS — E039 Hypothyroidism, unspecified: Secondary | ICD-10-CM

## 2022-11-22 DIAGNOSIS — S32592K Other specified fracture of left pubis, subsequent encounter for fracture with nonunion: Secondary | ICD-10-CM

## 2022-11-22 DIAGNOSIS — E871 Hypo-osmolality and hyponatremia: Secondary | ICD-10-CM

## 2022-11-22 DIAGNOSIS — K5909 Other constipation: Secondary | ICD-10-CM

## 2022-11-22 DIAGNOSIS — E1169 Type 2 diabetes mellitus with other specified complication: Secondary | ICD-10-CM | POA: Diagnosis not present

## 2022-11-22 DIAGNOSIS — I152 Hypertension secondary to endocrine disorders: Secondary | ICD-10-CM

## 2022-11-22 MED ORDER — FLUDROCORTISONE ACETATE 0.1 MG PO TABS
ORAL_TABLET | ORAL | 0 refills | Status: DC
Start: 2022-11-22 — End: 2023-03-29

## 2022-11-22 MED ORDER — VALSARTAN 320 MG PO TABS
320.0000 mg | ORAL_TABLET | Freq: Every day | ORAL | 0 refills | Status: DC
Start: 2022-11-22 — End: 2023-08-21

## 2022-11-22 MED ORDER — LEVOTHYROXINE SODIUM 137 MCG PO TABS
137.0000 ug | ORAL_TABLET | Freq: Every day | ORAL | 0 refills | Status: DC
Start: 2022-11-22 — End: 2023-02-28

## 2022-11-22 MED ORDER — PANCRELIPASE (LIP-PROT-AMYL) 36000-114000 UNITS PO CPEP: 36000.0000 [IU] | ORAL_CAPSULE | Freq: Three times a day (TID) | ORAL | 0 refills | Status: DC

## 2022-11-22 MED ORDER — FINASTERIDE 5 MG PO TABS: 5.0000 mg | ORAL_TABLET | Freq: Every day | ORAL | 0 refills | Status: AC

## 2022-11-22 MED ORDER — TAMSULOSIN HCL 0.4 MG PO CAPS
0.4000 mg | ORAL_CAPSULE | Freq: Two times a day (BID) | ORAL | 0 refills | Status: DC
Start: 2022-11-22 — End: 2023-01-17

## 2022-11-22 MED ORDER — ROSUVASTATIN CALCIUM 5 MG PO TABS
5.0000 mg | ORAL_TABLET | Freq: Every day | ORAL | 0 refills | Status: DC
Start: 2022-11-22 — End: 2023-09-26

## 2022-11-22 MED ORDER — POLYETHYLENE GLYCOL 3350 17 G PO PACK
17.0000 g | PACK | Freq: Every day | ORAL | 0 refills | Status: AC | PRN
Start: 2022-11-22 — End: 2022-12-02

## 2022-11-22 NOTE — Progress Notes (Unsigned)
Location:  Other Nursing Home Room Number: 105A Place of Service:  SNF (31)  Provider: Sydnee Cabal  PCP: Mort Sawyers, FNP Patient Care Team: Mort Sawyers, FNP as PCP - General (Family Medicine) Rollene Rotunda, MD as PCP - Cardiology (Cardiology) Pollyann Savoy, MD as Consulting Physician (Rheumatology) Reather Littler, MD (Inactive) as Consulting Physician (Endocrinology) Sherrie George, MD as Consulting Physician (Ophthalmology) Rene Paci, MD as Consulting Physician (Urology) [facility]  Extended Emergency Contact Information Primary Emergency Contact: Faeth,June Address: [address] [place] [address] [place] Home Phone: [phone] Mobile Phone: [phone] Relation: Spouse  Code Status: DNR Goals of care:  Advanced Directive information    11/10/2022   11:42 AM  Advanced Directives  Does Patient Have a Medical Advance Directive? Yes  Type of Estate agent of [place];Living will;Out of facility DNR (pink MOST or yellow form)  Does patient want to make changes to medical advance directive? No - Patient declined  Copy of Healthcare Power of Attorney in Chart? Yes - validated most recent copy scanned in chart (See row information)     Allergies  Allergen Reactions   Simvastatin Other (See Comments)    MYALGIAS   Oxycodone Other (See Comments)    Severe constipation    Chief Complaint  Patient presents with   Discharge Note    HPI:  89 y.o. male  who was admitted to rehab after pelvic fracture with a mechanical fall.   Patient had non-surgical management and is doing well. His pain is well-controlled with tylenol extra strength. He is confident that his wife and daughter will be sufficient support to go home. He wants to go after he has his follow up appointment tomorrow. Discussed the importance of continued balance training and having HH PT until he is able to get in and out  of a car safely.   PT/OT state patient is fairly independent, he just needs support with wearing socks.   Past Medical History:  Diagnosis Date   Arthritis    both hands;Dr.Deveshwar   Constipation due to pain medication    Diabetes mellitus without complication (HCC)    Type II. Uses no medication. Pt controls with diet and weight   Enlarged prostate    Frequent urination at night    GERD (gastroesophageal reflux disease)    History of noncompliance with medical treatment, presenting hazards to health 11/20/2018   Hyperlipidemia associated with type 2 diabetes mellitus (HCC) 04/19/2007   Qualifier: Diagnosis of  By: Alwyn Ren MD, William     Hypertension    Hypothyroidism    Polio    as a child w/o complications   Snoring    no sleep apnea   Statin declined-  11/20/2018   patient understands risks associated with this decision and has been advised to restart by cardiologist as well as myself several times   Torn meniscus    Transfusion history 2012   post TKR    Past Surgical History:  Procedure Laterality Date   ANTERIOR LAT LUMBAR FUSION Right 11/03/2017   Procedure: Right Lumbar three-four Anterolateral lumbar interbody fusion with lateral plate;  Surgeon: Maeola Harman, MD;  Location: [facility] OR;  Service: Neurosurgery;  Laterality: Right;   BACK SURGERY  2011   hemiarthroplasty 01/11/1999 and 6   CARDIAC CATHETERIZATION  09/2001   negative   COLONOSCOPY     COLONOSCOPY N/A 05/06/2022   Procedure: COLONOSCOPY;  Surgeon:  Toney Reil, MD;  Location: [facility] ENDOSCOPY;  Service: Gastroenterology;  Laterality: N/A;   COLONOSCOPY WITH PROPOFOL N/A 05/05/2022   Procedure: COLONOSCOPY WITH PROPOFOL;  Surgeon: Toney Reil, MD;  Location: [facility] ENDOSCOPY;  Service: Gastroenterology;  Laterality: N/A;   EYE SURGERY  2003   R&L cataract removed & IOL- 2003 & 2007,Dr.Jenkins   INGUINAL HERNIA REPAIR  2002   right, Dr. Luan Pulling   JOINT REPLACEMENT  2012   left knee replacement    LIPOMA EXCISION     back   MENISCECTOMY Bilateral    Dr. Thurston Hole   PROSTATE SURGERY  07/2006   for urinary urgency, Dr. Tiana Loft CUFF REPAIR Right October, 2016   sinus & throat surgery-1995  1995   TONSILLECTOMY     TOTAL KNEE ARTHROPLASTY  01/11/2011   Procedure: TOTAL KNEE ARTHROPLASTY;  Surgeon: Raymon Mutton, MD;  Location: [facility] OR;  Service: Orthopedics;  Laterality: Left;   TOTAL KNEE ARTHROPLASTY Right 11/22/2016   Procedure: TOTAL KNEE ARTHROPLASTY;  Surgeon: Dannielle Huh, MD;  Location: [facility] OR;  Service: Orthopedics;  Laterality: Right;   UVULECTOMY  1996   Dr. Lazarus Salines   VASECTOMY     WRIST SURGERY  1959   fracture- right   XI ROBOTIC ASSISTED VENTRAL HERNIA N/A 09/07/2020   Procedure: XI ROBOTIC ASSISTED DIAGNOSTIC LAPAROSCOPY;  Surgeon: Leafy Ro, MD;  Location: [facility] ORS;  Service: General;  Laterality: N/A;      reports that he quit smoking about 56 years ago. His smoking use included cigarettes. He started smoking about 74 years ago. He has a 18 pack-year smoking history. He has never been exposed to tobacco smoke. He has never used smokeless tobacco. He reports current alcohol use of about 4.0 standard drinks of alcohol per week. He reports that he does not use drugs. Social History   Socioeconomic History   Marital status: Married    Spouse name: June   Number of children: 7   Years of education: 2 years college   Highest education level: Not on file  Occupational History   Occupation: retired  Tobacco Use   Smoking status: Former    Current packs/day: 0.00    Average packs/day: 1 pack/day for 18.0 years (18.0 ttl pk-yrs)    Types: Cigarettes    Start date: 03/01/1948    Quit date: 03/01/1966    Years since quitting: 56.7    Passive exposure: Never   Smokeless tobacco: Never  Vaping Use   Vaping status: Never Used  Substance and Sexual Activity   Alcohol use: Yes    Alcohol/week: 4.0 standard drinks of alcohol    Types: 4 Shots of liquor per week     Comment: social, few drinks a week   Drug use: No   Sexual activity: Yes    Birth control/protection: Post-menopausal, Surgical  Other Topics Concern   Not on file  Social History Narrative   09/20/19   From: [place]   Living: with wife June (1985)   Work: retired from Company secretary, 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      Family: 5 children (one passed away) 2 stepchildren, 14 grandchildren, 9 great-grandchildren - Museum/gallery conservator and granddaughter nearby otherwise across the country       Enjoys: fly and play golf, reading      Exercise: walking the dog   Diet: diabetic diet      Safety   Seat belts: Yes  Guns: Yes  and secure   Safe in relationships: Yes    Social Determinants of Health   Financial Resource Strain: Low Risk  (09/09/2022)   Overall Financial Resource Strain (CARDIA)    Difficulty of Paying Living Expenses: Not hard at all  Food Insecurity: No Food Insecurity (11/05/2022)   Hunger Vital Sign    Worried About Running Out of Food in the Last Year: Never true    Ran Out of Food in the Last Year: Never true  Transportation Needs: No Transportation Needs (11/05/2022)   PRAPARE - Administrator, Civil Service (Medical): No    Lack of Transportation (Non-Medical): No  Physical Activity: Sufficiently Active (09/09/2022)   Exercise Vital Sign    Days of Exercise per Week: 3 days    Minutes of Exercise per Session: 60 min  Stress: No Stress Concern Present (09/09/2022)   [employer] of Occupational Health - Occupational Stress Questionnaire    Feeling of Stress : Not at all  Social Connections: Socially Integrated (09/09/2022)   Social Connection and Isolation Panel [NHANES]    Frequency of Communication with Friends and Family: More than three times a week    Frequency of Social Gatherings with Friends and Family: More than three times a week    Attends Religious Services: More than 4 times per year    Active Member of  [employer] or Organizations: Yes    Attends Banker Meetings: Never    Marital Status: Married  Catering manager Violence: Not At Risk (11/05/2022)   Humiliation, Afraid, Rape, and Kick questionnaire    Fear of Current or Ex-Partner: No    Emotionally Abused: No    Physically Abused: No    Sexually Abused: No   Functional Status Survey:    Allergies  Allergen Reactions   Simvastatin Other (See Comments)    MYALGIAS   Oxycodone Other (See Comments)    Severe constipation    Pertinent  Health Maintenance Due  Topic Date Due   INFLUENZA VACCINE  09/30/2022   HEMOGLOBIN A1C  04/02/2023   OPHTHALMOLOGY EXAM  07/29/2023   FOOT EXAM  10/07/2023   Colonoscopy  Discontinued    Medications: Outpatient Encounter Medications as of 11/22/2022  Medication Sig   ammonium lactate (AMLACTIN DAILY) 12 % lotion Apply 1 Application topically as needed for dry skin.   CLOBETASOL PROPIONATE E 0.05 % emollient cream as needed.   diclofenac (VOLTAREN) 50 MG EC tablet Take 1 tablet (50 mg total) by mouth 2 (two) times daily as needed for mild pain or moderate pain.   diclofenac Sodium (VOLTAREN) 1 % GEL Apply 2 g topically at bedtime.   Fe Fum-Vit C-Vit B12-FA (TRIGELS-F FORTE) CAPS capsule Take 1 capsule by mouth daily after breakfast.   finasteride (PROSCAR) 5 MG tablet Take 1 tablet (5 mg total) by mouth daily for 7 days.   fludrocortisone (FLORINEF) 0.1 MG tablet 1 tablet 3 days a week   fluticasone (FLONASE) 50 MCG/ACT nasal spray Place 2 sprays into both nostrils daily.   Hypromellose (ARTIFICIAL TEARS OP) Place 1 drop into both eyes 2 (two) times daily.   levothyroxine (SYNTHROID) 137 MCG tablet Take 1 tablet (137 mcg total) by mouth daily before breakfast.   lidocaine (LIDODERM) 5 % Place 1 patch onto the skin every 12 (twelve) hours. Remove & Discard patch within 12 hours or as directed by MD   lipase/protease/amylase (CREON) 36000 UNITS CPEP capsule Take 1 capsule (36,000 Units  total) by mouth 3 (three) times daily before meals.   magnesium hydroxide (MILK OF MAGNESIA) 400 MG/5ML suspension Take 30 mLs by mouth daily as needed for mild constipation.   mometasone (ELOCON) 0.1 % ointment Apply topically daily.   Omega-3 Fatty Acids (FISH OIL) 1000 MG CAPS Take 1,000 mg by mouth every evening.   polyethylene glycol (MIRALAX) 17 g packet Take 17 g by mouth daily as needed for up to 10 days for mild constipation or moderate constipation.   rosuvastatin (CRESTOR) 5 MG tablet Take 1 tablet (5 mg total) by mouth at bedtime.   tamsulosin (FLOMAX) 0.4 MG CAPS capsule Take 1 capsule (0.4 mg total) by mouth 2 (two) times daily.   valsartan (DIOVAN) 320 MG tablet Take 1 tablet (320 mg total) by mouth daily.   Zinc Oxide (TRIPLE PASTE) 12.8 % ointment Apply 1 Application topically. Every shift.   [DISCONTINUED] finasteride (PROSCAR) 5 MG tablet Take 5 mg by mouth daily.   [DISCONTINUED] fludrocortisone (FLORINEF) 0.1 MG tablet 1 tablet 3 days a week   [DISCONTINUED] HYDROcodone-acetaminophen (NORCO/VICODIN) 5-325 MG tablet Take 1 tablet by mouth every 4 (four) hours as needed for moderate pain.   [DISCONTINUED] insulin lispro (HUMALOG) 100 UNIT/ML injection Inject per sliding scale 0-120=0, 121-150=2, 151-200= 3, 201-250= 5, 251-300= 8, 301-350= 11, 351-400= 15, [phone] Notify MD   [DISCONTINUED] lipase/protease/amylase (CREON) 36000 UNITS CPEP capsule Take 1 capsule (36,000 Units total) by mouth 3 (three) times daily before meals.   [DISCONTINUED] polyethylene glycol (MIRALAX) 17 g packet Take 17 g by mouth daily as needed for mild constipation or moderate constipation.   [DISCONTINUED] rosuvastatin (CRESTOR) 5 MG tablet Take 1 tablet (5 mg total) by mouth at bedtime.   [DISCONTINUED] SYNTHROID 137 MCG tablet TAKE 1 TABLET DAILY BEFORE BREAKFAST   [DISCONTINUED] tamsulosin (FLOMAX) 0.4 MG CAPS capsule Take 1 capsule (0.4 mg total) by mouth 2 (two) times daily.   [DISCONTINUED]  valsartan (DIOVAN) 320 MG tablet Take 1 tablet (320 mg total) by mouth daily.   No facility-administered encounter medications on file as of 11/22/2022.    Review of Systems  Vitals:   11/23/22 1204  BP: 112/66  Pulse: 65  Resp: 14  Temp: 97.7 F (36.5 C)  TempSrc: Skin  SpO2: 98%  Weight: 180 lb 9.6 oz (81.9 kg)  Height: 5\' 10"  (1.778 m)   Body mass index is 25.91 kg/m. Physical Exam Vitals reviewed.  Constitutional:      Appearance: Normal appearance.  Cardiovascular:     Rate and Rhythm: Normal rate and regular rhythm.     Pulses: Normal pulses.     Heart sounds: Normal heart sounds.  Pulmonary:     Effort: Pulmonary effort is normal.     Breath sounds: Normal breath sounds.  Abdominal:     General: Abdomen is flat.     Palpations: Abdomen is soft.  Skin:    General: Skin is warm and dry.  Neurological:     General: No focal deficit present.     Mental Status: He is alert and oriented to person, place, and time. Mental status is at baseline.     Labs reviewed: Basic Metabolic Panel: Recent Labs    08/17/22 0852 09/27/22 0825 11/04/22 1803 11/05/22 0505  NA 132* 131* 132* 133*  K 4.0 4.5 4.0 4.3  CL 96 97 98 100  CO2 27 26 24 26   GLUCOSE 130* 118* 131* 116*  BUN 15 14 16 14   CREATININE 0.80 0.82 0.78  0.79  CALCIUM 8.6 9.5 8.9 8.3*  MG 1.9  --   --   --    Liver Function Tests: Recent Labs    11/04/22 1803 11/05/22 0505  AST 21 17  ALT 16 14  ALKPHOS 72 64  BILITOT 1.0 0.7  PROT 6.8 6.3*  ALBUMIN 3.8 3.5   No results for input(s): "LIPASE", "AMYLASE" in the last 8760 hours. No results for input(s): "AMMONIA" in the last 8760 hours. CBC: Recent Labs    04/27/22 1130 11/04/22 1803 11/05/22 0505  WBC 5.4 9.7 6.1  NEUTROABS 3.4 7.7  --   HGB 11.7* 11.0* 10.2*  HCT 34.1* 33.7* 30.6*  MCV 92.2 93.1 91.9  PLT 252.0 195 196   Cardiac Enzymes: Recent Labs    08/17/22 0852  CKTOTAL 55   BNP: Invalid input(s): "POCBNP" CBG: Recent  Labs    11/07/22 2019 11/08/22 0729 11/08/22 1130  GLUCAP 120* 103* 129*    Procedures and Imaging Studies During Stay: CT Head Wo Contrast  Result Date: 11/04/2022 CLINICAL DATA:  Head trauma, moderate-severe fall, hit head; Polytrauma, blunt EXAM: CT HEAD WITHOUT CONTRAST CT CERVICAL SPINE WITHOUT CONTRAST TECHNIQUE: Multidetector CT imaging of the head and cervical spine was performed following the standard protocol without intravenous contrast. Multiplanar CT image reconstructions of the cervical spine were also generated. RADIATION DOSE REDUCTION: This exam was performed according to the departmental dose-optimization program which includes automated exposure control, adjustment of the mA and/or kV according to patient size and/or use of iterative reconstruction technique. COMPARISON:  CT Head 07/10/22 FINDINGS: CT HEAD FINDINGS Brain: No evidence of acute infarction, hemorrhage, hydrocephalus, extra-axial collection or mass lesion/mass effect. 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 Vascular: No hyperdense vessel or unexpected calcification. Skull: Normal. Negative for fracture or focal lesion. Sinuses/Orbits: No middle ear or mastoid effusion. [facility] pattern left sided sinusitis. Bilateral lens replacement. Orbits are otherwise unremarkable. Other: None. CT CERVICAL SPINE FINDINGS Alignment: Normal. Skull base and vertebrae: No acute fracture. No primary bone lesion or focal pathologic process. Soft tissues and spinal canal: No prevertebral fluid or swelling. No visible canal hematoma. Disc levels:  No evidence of high-grade spinal canal stenosis. Upper chest: Negative. Other: Soft tissue lipoma along the midline posterior back. IMPRESSION: 1. No acute intracranial abnormality. 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 2. No acute fracture or traumatic malalignment of the cervical spine. Electronically Signed   By: Lorenza Cambridge M.D.   On: 11/04/2022 16:08   CT Cervical Spine Wo Contrast  Result Date: 11/04/2022 CLINICAL DATA:  Head trauma, moderate-severe fall, hit head; Polytrauma, blunt EXAM: CT HEAD WITHOUT CONTRAST CT CERVICAL SPINE WITHOUT CONTRAST TECHNIQUE: Multidetector CT imaging of the head and cervical spine was performed following the standard protocol without intravenous contrast. Multiplanar CT image reconstructions of the cervical spine were also generated. RADIATION DOSE REDUCTION: This exam was performed according to the departmental dose-optimization program which includes automated exposure control, adjustment of the mA and/or kV according to patient size and/or use of iterative reconstruction technique. COMPARISON:  CT Head 07/10/22 FINDINGS: CT HEAD FINDINGS Brain: No evidence of acute infarction, hemorrhage, hydrocephalus, extra-axial collection or mass lesion/mass effect. 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 Vascular: No hyperdense vessel or unexpected calcification. Skull: Normal. Negative for fracture or focal lesion. Sinuses/Orbits: No middle ear or mastoid effusion. O[facility] pattern left sided sinusitis. Bilateral lens replacement. Orbits are otherwise unremarkable. Other: None. CT CERVICAL SPINE FINDINGS Alignment: Normal. Skull base and vertebrae: No acute fracture. No primary bone lesion or focal pathologic process. Soft tissues and spinal canal: No prevertebral fluid or swelling. No visible canal hematoma. Disc levels:  No evidence of high-grade spinal canal stenosis. Upper chest: Negative. Other: Soft tissue lipoma along the midline posterior back. IMPRESSION: 1. No acute intracranial abnormality. 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 2. No acute fracture or traumatic malalignment of the cervical spine. Electronically Signed   By: Lorenza Cambridge M.D.   On: 11/04/2022 16:08   DG Lumbar Spine 2-3 Views  Result Date: 11/04/2022 CLINICAL DATA:  Fall with back pain.  Hip pain. EXAM: LUMBAR SPINE - 2-3 VIEW COMPARISON:  None Available. FINDINGS: Cross-table lateral views are limited due to overlying artifact. Postsurgical change at L3-L4 and L4-L5 with intact hardware. There is no evidence of acute fracture. Moderate degenerative disc disease at T12-L1, L1-L2, and L2-L3. The posterior elements are not well assessed. IMPRESSION: 1. No evidence of acute fracture of the lumbar spine. Limited posterior element assessment. 2. Postsurgical change at L3-L4 and L4-L5 with intact hardware. Electronically Signed   By: Narda Rutherford M.D.   On: 11/04/2022 15:47   DG Knee Complete 4 Views Left  Result Date: 11/04/2022 CLINICAL DATA:  Fall with left knee pain. EXAM: LEFT KNEE - COMPLETE 4+ VIEW COMPARISON:  None Available. FINDINGS: Left knee arthroplasty. No acute or periprosthetic fracture. No periprosthetic lucency. There has been patellar resurfacing. Minimal knee joint effusion. No erosive change. Unremarkable soft tissues. IMPRESSION: Left knee arthroplasty without complication or acute fracture. Electronically Signed   By: Narda Rutherford M.D.   On: 11/04/2022 15:46   DG Shoulder Left  Result Date: 11/04/2022 CLINICAL DATA:  Status post fall with left shoulder pain. EXAM: LEFT SHOULDER - 2+ VIEW COMPARISON:  None Available. FINDINGS: There is no evidence of fracture or dislocation. Minor acromioclavicular and glenohumeral degenerative change. No erosions. The included ribs are intact. Soft tissues are unremarkable. IMPRESSION: No fracture or dislocation of the left shoulder. Minor degenerative change.  Electronically Signed   By: Narda Rutherford M.D.   On: 11/04/2022 15:44   DG Ankle 2 Views Left  Result Date: 11/04/2022 CLINICAL DATA:  fall.  Left leg pain. EXAM: LEFT ANKLE - 2 VIEW COMPARISON:  None Available. FINDINGS: No acute fracture or dislocation. No aggressive osseous lesion. Ankle mortise appears intact. There is mild asymmetric soft tissue swelling overlying the lateral malleolus without discrete underlying fracture, favoring soft tissue/ligamentous injury. No radiopaque foreign bodies. IMPRESSION: 1. Lateral soft tissue swelling. No acute fracture or dislocation. Electronically Signed   By: Jules Schick M.D.   On: 11/04/2022 14:50   DG Hip Unilat With Pelvis 2-3 Views Left  Result Date: 11/04/2022 CLINICAL DATA:  fall, hip pain. EXAM: DG HIP (WITH OR WITHOUT PELVIS) 2-3V LEFT COMPARISON:  CT scan abdomen and pelvis from 09/06/2020. FINDINGS: There is mildly displaced fracture of the left superior and inferior pubic rami, which is of indeterminate age. The fracture margins are not well visualized therefore dating of the fracture is limited; however favored acute. These fractures are new  since the prior study from 09/06/2020. Correlate with physical examination for tenderness. Pelvis is intact with normal and symmetric sacroiliac joints. No aggressive osseous lesion. Visualized sacral arcuate lines are unremarkable. Unremarkable symphysis pubis. There are moderate degenerative changes of bilateral hip joints in the form of reduced joint space and osteophytosis. No radiopaque foreign bodies. Partially seen lower lumbar spinal fixation hardware. There are multiple metallic anchors overlying the right lower pelvis, likely from prior hernia repair. IMPRESSION: 1. Mildly displaced fracture of the left superior and inferior pubic rami, of indeterminate age. Correlate with physical examination. 2. Moderate bilateral hip osteoarthritis. Electronically Signed   By: Jules Schick M.D.   On: 11/04/2022  14:47    Assessment/Plan:   Closed fracture of multiple pubic rami, left, with nonunion, subsequent encounter  Hypertension associated with diabetes (HCC) - Plan: valsartan (DIOVAN) 320 MG tablet  Benign prostatic hyperplasia without lower urinary tract symptoms - Plan: tamsulosin (FLOMAX) 0.4 MG CAPS capsule, finasteride (PROSCAR) 5 MG tablet  Hyperlipidemia associated with type 2 diabetes mellitus (HCC) - Plan: rosuvastatin (CRESTOR) 5 MG tablet  Chronic constipation - Plan: polyethylene glycol (MIRALAX) 17 g packet  Hyponatremia - Plan: fludrocortisone (FLORINEF) 0.1 MG tablet  Acquired hypothyroidism - Plan: levothyroxine (SYNTHROID) 137 MCG tablet  Stage 3 chronic kidney disease, unspecified whether stage 3a or 3b CKD (HCC) Patient has received physical therapy and is prepared for discharge. 1 week of medications sent to his local pharmacy. Pain well-controlled with tylenol, will no refill controlled substance at this time. BP under control. Stooling well and tolerating a regular diet. Labs while present were stable, however, will need recheck in 1-2 weeks with PCP.    Patient is being discharged with the following home health services:    Patient is being discharged with the following durable medical equipment:    Patient has been advised to f/u with their PCP in 1-2 weeks to for a transitions of care visit.  Social services at their facility was responsible for arranging this appointment.  Pt was provided with adequate prescriptions of noncontrolled medications to reach the scheduled appointment .  For controlled substances, a limited supply was provided as appropriate for the individual patient.  If the pt normally receives these medications from a pain clinic or has a contract with another physician, these medications should be received from that clinic or physician only).    Future labs/tests needed:  CBC, BMP

## 2022-11-23 ENCOUNTER — Encounter: Payer: Self-pay | Admitting: Student

## 2022-11-23 DIAGNOSIS — R102 Pelvic and perineal pain: Secondary | ICD-10-CM | POA: Diagnosis not present

## 2022-11-23 DIAGNOSIS — S32810A Multiple fractures of pelvis with stable disruption of pelvic ring, initial encounter for closed fracture: Secondary | ICD-10-CM | POA: Diagnosis not present

## 2022-12-01 ENCOUNTER — Encounter: Payer: Self-pay | Admitting: Family

## 2022-12-01 ENCOUNTER — Telehealth: Payer: Self-pay | Admitting: Family

## 2022-12-01 ENCOUNTER — Ambulatory Visit: Payer: Medicare Other | Admitting: Family

## 2022-12-01 VITALS — BP 126/74 | HR 78 | Temp 97.3°F | Ht 70.0 in | Wt 183.0 lb

## 2022-12-01 DIAGNOSIS — Z23 Encounter for immunization: Secondary | ICD-10-CM

## 2022-12-01 DIAGNOSIS — R2681 Unsteadiness on feet: Secondary | ICD-10-CM | POA: Diagnosis not present

## 2022-12-01 DIAGNOSIS — Z9181 History of falling: Secondary | ICD-10-CM

## 2022-12-01 DIAGNOSIS — S32592A Other specified fracture of left pubis, initial encounter for closed fracture: Secondary | ICD-10-CM | POA: Diagnosis not present

## 2022-12-01 DIAGNOSIS — R29898 Other symptoms and signs involving the musculoskeletal system: Secondary | ICD-10-CM | POA: Diagnosis not present

## 2022-12-01 DIAGNOSIS — M62838 Other muscle spasm: Secondary | ICD-10-CM | POA: Diagnosis not present

## 2022-12-01 DIAGNOSIS — E785 Hyperlipidemia, unspecified: Secondary | ICD-10-CM | POA: Diagnosis not present

## 2022-12-01 DIAGNOSIS — R5381 Other malaise: Secondary | ICD-10-CM | POA: Diagnosis not present

## 2022-12-01 DIAGNOSIS — I700 Atherosclerosis of aorta: Secondary | ICD-10-CM | POA: Insufficient documentation

## 2022-12-01 DIAGNOSIS — Z8673 Personal history of transient ischemic attack (TIA), and cerebral infarction without residual deficits: Secondary | ICD-10-CM | POA: Insufficient documentation

## 2022-12-01 DIAGNOSIS — R790 Abnormal level of blood mineral: Secondary | ICD-10-CM | POA: Diagnosis not present

## 2022-12-01 DIAGNOSIS — D509 Iron deficiency anemia, unspecified: Secondary | ICD-10-CM

## 2022-12-01 LAB — IBC + FERRITIN
Ferritin: 113.2 ng/mL (ref 22.0–322.0)
Iron: 87 ug/dL (ref 42–165)
Saturation Ratios: 25.9 % (ref 20.0–50.0)
TIBC: 336 ug/dL (ref 250.0–450.0)
Transferrin: 240 mg/dL (ref 212.0–360.0)

## 2022-12-01 LAB — LIPID PANEL
Cholesterol: 99 mg/dL (ref 0–200)
HDL: 49 mg/dL (ref >39.00)
LDL Cholesterol: 29 mg/dL (ref 0–99)
NonHDL: 49.71
Total CHOL/HDL Ratio: 2
Triglycerides: 103 mg/dL (ref 0.0–149.0)
VLDL: 20.6 mg/dL (ref 0.0–40.0)

## 2022-12-01 LAB — CBC WITH DIFFERENTIAL/PLATELET
Basophils Absolute: 0 10*3/uL (ref 0.0–0.1)
Basophils Relative: 0.5 % (ref 0.0–3.0)
Eosinophils Absolute: 0.2 10*3/uL (ref 0.0–0.7)
Eosinophils Relative: 3.6 % (ref 0.0–5.0)
HCT: 33.1 % — ABNORMAL LOW (ref 39.0–52.0)
Hemoglobin: 10.9 g/dL — ABNORMAL LOW (ref 13.0–17.0)
Lymphocytes Relative: 27.9 % (ref 12.0–46.0)
Lymphs Abs: 1.5 10*3/uL (ref 0.7–4.0)
MCHC: 33 g/dL (ref 30.0–36.0)
MCV: 92.6 fL (ref 78.0–100.0)
Monocytes Absolute: 0.5 10*3/uL (ref 0.1–1.0)
Monocytes Relative: 9.8 % (ref 3.0–12.0)
Neutro Abs: 3.1 10*3/uL (ref 1.4–7.7)
Neutrophils Relative %: 58.2 % (ref 43.0–77.0)
Platelets: 261 10*3/uL (ref 150.0–400.0)
RBC: 3.58 Mil/uL — ABNORMAL LOW (ref 4.22–5.81)
RDW: 13.1 % (ref 11.5–15.5)
WBC: 5.2 10*3/uL (ref 4.0–10.5)

## 2022-12-01 LAB — BASIC METABOLIC PANEL
BUN: 15 mg/dL (ref 6–23)
CO2: 25 meq/L (ref 19–32)
Calcium: 8.6 mg/dL (ref 8.4–10.5)
Chloride: 96 meq/L (ref 96–112)
Creatinine, Ser: 0.86 mg/dL (ref 0.40–1.50)
GFR: 77.71 mL/min (ref >60.00)
Glucose, Bld: 122 mg/dL — ABNORMAL HIGH (ref 70–99)
Potassium: 4.7 meq/L (ref 3.5–5.1)
Sodium: 128 meq/L — ABNORMAL LOW (ref 135–145)

## 2022-12-01 NOTE — Progress Notes (Signed)
Established Patient Office Visit  Subjective:      CC:  Chief Complaint  Patient presents with   Medical Management of Chronic Issues    HPI: Kyle Simon is a 89 y.o. male presenting on 12/01/2022 for Medical Management of Chronic Issues . Went to ER 11/04/22, admitted and discharged 9/9 to rehab at [facility], inpatient. Discharged from [facility] 9/25. Cbc with mild anemia decrease,   pt went in for a fall outside in his driveway. He c/o hitting his head no loc, with c/o shoulder pain, knee pain, and hip pain.   CT cervical spine, no acute fracture. Soft tissue lipoma midline posterior back. Moderate chronic microvascular ischemic change with chronic infarct.   Lumbar spine XRAy, no acute findings.   Left knee xray: minimal knee joint effusion.   Left shoulder xray, no acute evidence fracture or dislocation. Minor degenerative change.   Left ankles: no acute findings. Soft tissue swelling lateral malleolus   Xray left hip, mildly displaced fracture left superior and inferior pubic rami, moderate bil hip OA  Notes were to repeat cbc bmp x 1 week after discharge, f/u with orthopedist. Advise dto use voltaren tablets prn . Wife states has since f/u with Dr. Sheppard Penton, repeat xray   New complaints: He is currently able to ambulate using a rolling walker.  Pt states that left shoulder still gives him some pain, from left shoulder to elbow will cause pain with certain movements. He is having difficulty sleeping at night due to the discomfort. He does use voltaren with some relief.   A1c last 09/30/22 6.4 Was discharged on humolog 100 units prior to meal however they have not continued outside of the hospital. Have not been checking fasting sugars at home pt states 'I feel fine'   Social history:  Relevant past medical, surgical, family and social history reviewed and updated as indicated. Interim medical history since our last visit reviewed.  Allergies and medications reviewed  and updated.  DATA REVIEWED: CHART IN EPIC     ROS: Negative unless specifically indicated above in HPI.    Current Outpatient Medications:    celecoxib (CELEBREX) 100 MG capsule, Take 100 mg by mouth 2 (two) times daily., Disp: , Rfl:    CLOBETASOL PROPIONATE E 0.05 % emollient cream, as needed., Disp: , Rfl:    diclofenac Sodium (VOLTAREN) 1 % GEL, Apply 2 g topically at bedtime., Disp: 100 g, Rfl: 2   Fe Fum-Vit C-Vit B12-FA (TRIGELS-F FORTE) CAPS capsule, Take 1 capsule by mouth daily after breakfast., Disp: , Rfl:    fludrocortisone (FLORINEF) 0.1 MG tablet, 1 tablet 3 days a week, Disp: 7 tablet, Rfl: 0   fluticasone (FLONASE) 50 MCG/ACT nasal spray, Place 2 sprays into both nostrils daily., Disp: , Rfl:    Hypromellose (ARTIFICIAL TEARS OP), Place 1 drop into both eyes 2 (two) times daily., Disp: , Rfl:    levothyroxine (SYNTHROID) 137 MCG tablet, Take 1 tablet (137 mcg total) by mouth daily before breakfast., Disp: 7 tablet, Rfl: 0   lidocaine (LIDODERM) 5 %, Place 1 patch onto the skin every 12 (twelve) hours. Remove & Discard patch within 12 hours or as directed by MD, Disp: 180 patch, Rfl: 3   lipase/protease/amylase (CREON) 36000 UNITS CPEP capsule, Take 1 capsule (36,000 Units total) by mouth 3 (three) times daily before meals., Disp: 450 capsule, Rfl: 0   Omega-3 Fatty Acids (FISH OIL) 1000 MG CAPS, Take 1,000 mg by mouth every evening., Disp: ,  Rfl:    rosuvastatin (CRESTOR) 5 MG tablet, Take 1 tablet (5 mg total) by mouth at bedtime., Disp: 7 tablet, Rfl: 0   tamsulosin (FLOMAX) 0.4 MG CAPS capsule, Take 1 capsule (0.4 mg total) by mouth 2 (two) times daily., Disp: 7 capsule, Rfl: 0   valsartan (DIOVAN) 320 MG tablet, Take 1 tablet (320 mg total) by mouth daily., Disp: 7 tablet, Rfl: 0      Objective:    BP 126/74 (BP Location: Left Arm, Patient Position: Sitting, Cuff Size: Normal)   Pulse 78   Temp (!) 97.3 F (36.3 C) (Temporal)   Ht 5\' 10"  (1.778 m)   Wt 183 lb  (83 kg)   SpO2 100%   BMI 26.26 kg/m   Wt Readings from Last 3 Encounters:  12/01/22 183 lb (83 kg)  11/23/22 180 lb 9.6 oz (81.9 kg)  11/10/22 185 lb 8 oz (84.1 kg)    Physical Exam Musculoskeletal:     Left shoulder: Tenderness (muscular tenderness left posterior neck with palpation) present. No effusion. Normal range of motion.           Assessment & Plan:  Closed fracture of multiple pubic rami, left, initial encounter [facility]) Assessment & Plan: Completed outpatient rehab and now discharged for home.  Referral placed for home health physical therapy and Occupational Therapy   Orders: -     Ambulatory referral to Home Health  Iron deficiency anemia, unspecified iron deficiency anemia type Assessment & Plan: Repeat CBC IBC and ferritin today to monitor for worsening anemia.  Patient currently asymptomatic  Orders: -     CBC with Differential/Platelet -     IBC + Ferritin  Calcium blood decreased -     Basic metabolic panel  Thoracic aorta atherosclerosis (HCC)  Cerebral infarction, chronic  Unstable gait -     Ambulatory referral to Home Health  Weakness of both lower extremities -     Ambulatory referral to Home Health  Physical debility -     Ambulatory referral to Home Health  Muscle spasm  History of fall Assessment & Plan: Patient with repeated falls.  Advised safe recommendations for home as well as for patient to always ambulate with a walker and/or assistance.  Referral has been placed for physical therapy for strengthening as well.   Dyslipidemia Assessment & Plan: Ordered lipid panel, pending results. Work on low cholesterol diet and exercise as tolerated   Orders: -     Lipid panel  Encounter for immunization -     Flu Vaccine Trivalent High Dose (Fluad)     Return in about 3 months (around 03/03/2023) for f/u blood pressure.  Mort Sawyers, MSN, APRN, FNP-C [facility]

## 2022-12-01 NOTE — Patient Instructions (Signed)
A referral was placed today for physical therapy.  ?Please let us know if you have not heard back within 2 weeks about the referral. ? ? ? ?

## 2022-12-01 NOTE — Telephone Encounter (Signed)
Arville Lime from [facility] called stating she received a referral for pt from someone named Laila. Arville Lime states she hasn't heard from a referral coordinator by the name of Laila, she's only dealt with Ashtyn. Shaune Pascal we have a referral team so its a possibility someone other than Ashtyn might've sent the referral. Arville Lime states she sent Ashtyn a email but hasn't gotten a response yet. Please advise. Call back # [phone], secured

## 2022-12-03 DIAGNOSIS — D509 Iron deficiency anemia, unspecified: Secondary | ICD-10-CM | POA: Diagnosis not present

## 2022-12-03 DIAGNOSIS — Z791 Long term (current) use of non-steroidal anti-inflammatories (NSAID): Secondary | ICD-10-CM | POA: Diagnosis not present

## 2022-12-03 DIAGNOSIS — Z9181 History of falling: Secondary | ICD-10-CM | POA: Diagnosis not present

## 2022-12-03 DIAGNOSIS — E785 Hyperlipidemia, unspecified: Secondary | ICD-10-CM | POA: Diagnosis not present

## 2022-12-03 DIAGNOSIS — M1990 Unspecified osteoarthritis, unspecified site: Secondary | ICD-10-CM | POA: Diagnosis not present

## 2022-12-03 DIAGNOSIS — I700 Atherosclerosis of aorta: Secondary | ICD-10-CM | POA: Diagnosis not present

## 2022-12-03 DIAGNOSIS — S32592D Other specified fracture of left pubis, subsequent encounter for fracture with routine healing: Secondary | ICD-10-CM | POA: Diagnosis not present

## 2022-12-07 NOTE — Assessment & Plan Note (Signed)
Completed outpatient rehab and now discharged for home.  Referral placed for home health physical therapy and Occupational Therapy

## 2022-12-07 NOTE — Assessment & Plan Note (Signed)
Patient with repeated falls.  Advised safe recommendations for home as well as for patient to always ambulate with a walker and/or assistance.  Referral has been placed for physical therapy for strengthening as well.

## 2022-12-07 NOTE — Assessment & Plan Note (Signed)
Repeat CBC IBC and ferritin today to monitor for worsening anemia.  Patient currently asymptomatic

## 2022-12-07 NOTE — Assessment & Plan Note (Signed)
Ordered lipid panel, pending results. Work on low cholesterol diet and exercise as tolerated  

## 2022-12-08 ENCOUNTER — Encounter: Payer: Medicare Other | Admitting: Family

## 2022-12-08 DIAGNOSIS — Z791 Long term (current) use of non-steroidal anti-inflammatories (NSAID): Secondary | ICD-10-CM | POA: Diagnosis not present

## 2022-12-08 DIAGNOSIS — I700 Atherosclerosis of aorta: Secondary | ICD-10-CM | POA: Diagnosis not present

## 2022-12-08 DIAGNOSIS — S32592D Other specified fracture of left pubis, subsequent encounter for fracture with routine healing: Secondary | ICD-10-CM | POA: Diagnosis not present

## 2022-12-08 DIAGNOSIS — E785 Hyperlipidemia, unspecified: Secondary | ICD-10-CM | POA: Diagnosis not present

## 2022-12-08 DIAGNOSIS — D509 Iron deficiency anemia, unspecified: Secondary | ICD-10-CM | POA: Diagnosis not present

## 2022-12-08 DIAGNOSIS — M1990 Unspecified osteoarthritis, unspecified site: Secondary | ICD-10-CM | POA: Diagnosis not present

## 2022-12-09 DIAGNOSIS — I700 Atherosclerosis of aorta: Secondary | ICD-10-CM | POA: Diagnosis not present

## 2022-12-09 DIAGNOSIS — M1990 Unspecified osteoarthritis, unspecified site: Secondary | ICD-10-CM | POA: Diagnosis not present

## 2022-12-09 DIAGNOSIS — Z791 Long term (current) use of non-steroidal anti-inflammatories (NSAID): Secondary | ICD-10-CM | POA: Diagnosis not present

## 2022-12-09 DIAGNOSIS — D509 Iron deficiency anemia, unspecified: Secondary | ICD-10-CM | POA: Diagnosis not present

## 2022-12-09 DIAGNOSIS — S32592D Other specified fracture of left pubis, subsequent encounter for fracture with routine healing: Secondary | ICD-10-CM | POA: Diagnosis not present

## 2022-12-09 DIAGNOSIS — E785 Hyperlipidemia, unspecified: Secondary | ICD-10-CM | POA: Diagnosis not present

## 2022-12-10 DIAGNOSIS — Z23 Encounter for immunization: Secondary | ICD-10-CM | POA: Diagnosis not present

## 2022-12-14 ENCOUNTER — Other Ambulatory Visit: Payer: Self-pay | Admitting: Gastroenterology

## 2022-12-14 DIAGNOSIS — M1990 Unspecified osteoarthritis, unspecified site: Secondary | ICD-10-CM | POA: Diagnosis not present

## 2022-12-14 DIAGNOSIS — Z791 Long term (current) use of non-steroidal anti-inflammatories (NSAID): Secondary | ICD-10-CM | POA: Diagnosis not present

## 2022-12-14 DIAGNOSIS — S32592D Other specified fracture of left pubis, subsequent encounter for fracture with routine healing: Secondary | ICD-10-CM | POA: Diagnosis not present

## 2022-12-14 DIAGNOSIS — E785 Hyperlipidemia, unspecified: Secondary | ICD-10-CM | POA: Diagnosis not present

## 2022-12-14 DIAGNOSIS — I700 Atherosclerosis of aorta: Secondary | ICD-10-CM | POA: Diagnosis not present

## 2022-12-14 DIAGNOSIS — D509 Iron deficiency anemia, unspecified: Secondary | ICD-10-CM | POA: Diagnosis not present

## 2022-12-16 DIAGNOSIS — S32592D Other specified fracture of left pubis, subsequent encounter for fracture with routine healing: Secondary | ICD-10-CM | POA: Diagnosis not present

## 2022-12-16 DIAGNOSIS — Z791 Long term (current) use of non-steroidal anti-inflammatories (NSAID): Secondary | ICD-10-CM | POA: Diagnosis not present

## 2022-12-16 DIAGNOSIS — E785 Hyperlipidemia, unspecified: Secondary | ICD-10-CM | POA: Diagnosis not present

## 2022-12-16 DIAGNOSIS — D509 Iron deficiency anemia, unspecified: Secondary | ICD-10-CM | POA: Diagnosis not present

## 2022-12-16 DIAGNOSIS — I700 Atherosclerosis of aorta: Secondary | ICD-10-CM | POA: Diagnosis not present

## 2022-12-16 DIAGNOSIS — M1990 Unspecified osteoarthritis, unspecified site: Secondary | ICD-10-CM | POA: Diagnosis not present

## 2022-12-21 DIAGNOSIS — G8929 Other chronic pain: Secondary | ICD-10-CM | POA: Diagnosis not present

## 2022-12-21 DIAGNOSIS — M5489 Other dorsalgia: Secondary | ICD-10-CM | POA: Diagnosis not present

## 2022-12-21 DIAGNOSIS — M7542 Impingement syndrome of left shoulder: Secondary | ICD-10-CM | POA: Diagnosis not present

## 2022-12-22 ENCOUNTER — Other Ambulatory Visit: Payer: Self-pay | Admitting: Student

## 2022-12-22 DIAGNOSIS — E785 Hyperlipidemia, unspecified: Secondary | ICD-10-CM | POA: Diagnosis not present

## 2022-12-22 DIAGNOSIS — E871 Hypo-osmolality and hyponatremia: Secondary | ICD-10-CM

## 2022-12-22 DIAGNOSIS — D509 Iron deficiency anemia, unspecified: Secondary | ICD-10-CM | POA: Diagnosis not present

## 2022-12-22 DIAGNOSIS — Z791 Long term (current) use of non-steroidal anti-inflammatories (NSAID): Secondary | ICD-10-CM | POA: Diagnosis not present

## 2022-12-22 DIAGNOSIS — S32592D Other specified fracture of left pubis, subsequent encounter for fracture with routine healing: Secondary | ICD-10-CM | POA: Diagnosis not present

## 2022-12-22 DIAGNOSIS — I700 Atherosclerosis of aorta: Secondary | ICD-10-CM | POA: Diagnosis not present

## 2022-12-22 DIAGNOSIS — M1990 Unspecified osteoarthritis, unspecified site: Secondary | ICD-10-CM | POA: Diagnosis not present

## 2022-12-24 DIAGNOSIS — Z791 Long term (current) use of non-steroidal anti-inflammatories (NSAID): Secondary | ICD-10-CM | POA: Diagnosis not present

## 2022-12-24 DIAGNOSIS — E785 Hyperlipidemia, unspecified: Secondary | ICD-10-CM | POA: Diagnosis not present

## 2022-12-24 DIAGNOSIS — M1990 Unspecified osteoarthritis, unspecified site: Secondary | ICD-10-CM | POA: Diagnosis not present

## 2022-12-24 DIAGNOSIS — S32592D Other specified fracture of left pubis, subsequent encounter for fracture with routine healing: Secondary | ICD-10-CM | POA: Diagnosis not present

## 2022-12-24 DIAGNOSIS — I700 Atherosclerosis of aorta: Secondary | ICD-10-CM | POA: Diagnosis not present

## 2022-12-24 DIAGNOSIS — D509 Iron deficiency anemia, unspecified: Secondary | ICD-10-CM | POA: Diagnosis not present

## 2022-12-27 ENCOUNTER — Ambulatory Visit (INDEPENDENT_AMBULATORY_CARE_PROVIDER_SITE_OTHER): Payer: Medicare Other | Admitting: Family

## 2022-12-27 ENCOUNTER — Encounter: Payer: Self-pay | Admitting: *Deleted

## 2022-12-27 ENCOUNTER — Encounter: Payer: Self-pay | Admitting: Family

## 2022-12-27 VITALS — BP 152/82 | HR 77 | Temp 97.7°F | Ht 70.0 in | Wt 182.6 lb

## 2022-12-27 DIAGNOSIS — D508 Other iron deficiency anemias: Secondary | ICD-10-CM

## 2022-12-27 DIAGNOSIS — E1159 Type 2 diabetes mellitus with other circulatory complications: Secondary | ICD-10-CM | POA: Diagnosis not present

## 2022-12-27 DIAGNOSIS — F329 Major depressive disorder, single episode, unspecified: Secondary | ICD-10-CM | POA: Diagnosis not present

## 2022-12-27 DIAGNOSIS — R296 Repeated falls: Secondary | ICD-10-CM | POA: Diagnosis not present

## 2022-12-27 DIAGNOSIS — E039 Hypothyroidism, unspecified: Secondary | ICD-10-CM

## 2022-12-27 DIAGNOSIS — E871 Hypo-osmolality and hyponatremia: Secondary | ICD-10-CM | POA: Diagnosis not present

## 2022-12-27 DIAGNOSIS — I152 Hypertension secondary to endocrine disorders: Secondary | ICD-10-CM | POA: Diagnosis not present

## 2022-12-27 DIAGNOSIS — R5383 Other fatigue: Secondary | ICD-10-CM

## 2022-12-27 LAB — T3, FREE: T3, Free: 2.9 pg/mL (ref 2.3–4.2)

## 2022-12-27 LAB — TSH: TSH: 2.76 u[IU]/mL (ref 0.35–5.50)

## 2022-12-27 LAB — CBC
HCT: 35.8 % — ABNORMAL LOW (ref 39.0–52.0)
Hemoglobin: 11.6 g/dL — ABNORMAL LOW (ref 13.0–17.0)
MCHC: 32.3 g/dL (ref 30.0–36.0)
MCV: 93.6 fL (ref 78.0–100.0)
Platelets: 305 10*3/uL (ref 150.0–400.0)
RBC: 3.83 Mil/uL — ABNORMAL LOW (ref 4.22–5.81)
RDW: 13.6 % (ref 11.5–15.5)
WBC: 7.2 10*3/uL (ref 4.0–10.5)

## 2022-12-27 LAB — VITAMIN B12: Vitamin B-12: 875 pg/mL (ref 211–911)

## 2022-12-27 LAB — T4, FREE: Free T4: 1.36 ng/dL (ref 0.60–1.60)

## 2022-12-27 MED ORDER — MIRTAZAPINE 15 MG PO TABS: ORAL_TABLET | ORAL | 0 refills | Status: DC

## 2022-12-27 NOTE — Patient Instructions (Signed)
  Call orthopedist office to see if they still wanted him to take the steroid pack, as you state you did not receive this. Advise them that your pain is not improving.

## 2022-12-27 NOTE — Progress Notes (Unsigned)
Established Patient Office Visit  Subjective:      CC:  Chief Complaint  Patient presents with   Back Pain    Had a fall a few weeks ago and hit his back on a table. Back was xray at [facility], this came back normal but he is still having pain in the L lower portion of his back.    HPI: Kyle Simon is a 89 y.o. male presenting on 12/27/2022 for Back Pain (Had a fall a few weeks ago and hit his back on a table. Back was xray at [facility], this came back normal but he is still having pain in the L lower portion of his back.) . Was feeding the dog, tripped and then fell backwards on the table did not hit his head, just his back in the middle back area. He states he thinks he lost his balance which resulted in him falling backwards. Did go to [facility] for a scheduled orthopedist appt 10/22 which coincidently was the day later the fall and thoracic spin xray was ordered. He reports no decrease ROM from his baseline.   Xray thoracic spine without any acute bony abn.  Noted with slight kyphosis.  Xray cervical spine DDD, no acute abn  He is still having ongoing physical therapy twice weekly in the house for balance issues and weakness. They are working on transfer exercises.   Wife states does not seem to be progressing with his strength throughout the physical therapy, the fall has lessened his progress temporarily. He is mentally tired because he is frustrated. He has since restarted his b12 and iron supplementation over the counter, tolerating well.   No change in voiding or urinary symptoms.  No difficulty breathing or pain with breathing.     Social history:  Relevant past medical, surgical, family and social history reviewed and updated as indicated. Interim medical history since our last visit reviewed.  Allergies and medications reviewed and updated.  DATA REVIEWED: CHART IN EPIC     ROS: Negative unless specifically indicated above in HPI.    Current  Outpatient Medications:    celecoxib (CELEBREX) 100 MG capsule, Take 100 mg by mouth 2 (two) times daily., Disp: , Rfl:    CLOBETASOL PROPIONATE E 0.05 % emollient cream, as needed., Disp: , Rfl:    diclofenac Sodium (VOLTAREN) 1 % GEL, Apply 2 g topically at bedtime., Disp: 100 g, Rfl: 2   Fe Fum-Vit C-Vit B12-FA (TRIGELS-F FORTE) CAPS capsule, Take 1 capsule by mouth daily after breakfast., Disp: , Rfl:    fludrocortisone (FLORINEF) 0.1 MG tablet, 1 tablet 3 days a week, Disp: 7 tablet, Rfl: 0   fluticasone (FLONASE) 50 MCG/ACT nasal spray, Place 2 sprays into both nostrils daily., Disp: , Rfl:    Hypromellose (ARTIFICIAL TEARS OP), Place 1 drop into both eyes 2 (two) times daily., Disp: , Rfl:    levothyroxine (SYNTHROID) 137 MCG tablet, Take 1 tablet (137 mcg total) by mouth daily before breakfast., Disp: 7 tablet, Rfl: 0   lidocaine (LIDODERM) 5 %, Place 1 patch onto the skin every 12 (twelve) hours. Remove & Discard patch within 12 hours or as directed by MD, Disp: 180 patch, Rfl: 3   lipase/protease/amylase (CREON) 36000 UNITS CPEP capsule, Take 1 capsule (36,000 Units total) by mouth 3 (three) times daily before meals., Disp: 450 capsule, Rfl: 0   mirtazapine (REMERON) 15 MG tablet, Take one po at bedtime, Disp: 90 tablet, Rfl: 0   Omega-3  Fatty Acids (FISH OIL) 1000 MG CAPS, Take 1,000 mg by mouth every evening., Disp: , Rfl:    rosuvastatin (CRESTOR) 5 MG tablet, Take 1 tablet (5 mg total) by mouth at bedtime., Disp: 7 tablet, Rfl: 0   tamsulosin (FLOMAX) 0.4 MG CAPS capsule, Take 1 capsule (0.4 mg total) by mouth 2 (two) times daily., Disp: 7 capsule, Rfl: 0   valsartan (DIOVAN) 320 MG tablet, Take 1 tablet (320 mg total) by mouth daily., Disp: 7 tablet, Rfl: 0      Objective:    BP (!) 152/82   Pulse 77   Temp 97.7 F (36.5 C)   Ht 5\' 10"  (1.778 m)   Wt 182 lb 9.6 oz (82.8 kg)   SpO2 100%   BMI 26.20 kg/m   Wt Readings from Last 3 Encounters:  12/27/22 182 lb 9.6 oz (82.8  kg)  12/01/22 183 lb (83 kg)  11/23/22 180 lb 9.6 oz (81.9 kg)    Physical Exam Constitutional:      General: He is not in acute distress.    Appearance: Normal appearance. He is normal weight. He is not ill-appearing, toxic-appearing or diaphoretic.  Cardiovascular:     Rate and Rhythm: Normal rate and regular rhythm.  Pulmonary:     Effort: Pulmonary effort is normal.  Musculoskeletal:        General: Normal range of motion.     Comments: Ecchymosis left lower lateral side of abdomen from mid to left lower back.   Neurological:     General: No focal deficit present.     Mental Status: He is alert and oriented to person, place, and time. Mental status is at baseline.     Motor: Motor function is intact.     Gait: Gait abnormal.  Psychiatric:        Mood and Affect: Mood normal.        Behavior: Behavior normal.        Thought Content: Thought content normal.        Judgment: Judgment normal.           Assessment & Plan:  Acquired hypothyroidism Assessment & Plan: Continue levothyroxine  Thyroid panel ordered pending results  Orders: -     TSH -     T4, free -     T3, free -     Ambulatory referral to Endocrinology  Other fatigue -     Vitamin B12 -     TSH -     CBC -     IBC + Ferritin -     Basic metabolic panel -     Mirtazapine; Take one po at bedtime  Dispense: 90 tablet; Refill: 0  Iron deficiency anemia secondary to inadequate dietary iron intake Assessment & Plan: Labs ordered to verify that there is stability with anemia.  Orders: -     CBC -     IBC + Ferritin  Hypertension associated with diabetes (HCC) -     Basic metabolic panel  Hyponatremia Assessment & Plan: Chronic with stability.  His endocrinologist is retiring so he is requesting an endocrinologist that may be closer to home.  Placed referral  Orders: -     Basic metabolic panel -     Ambulatory referral to Endocrinology  Reactive depression Assessment & Plan: Suspected  health related depression with accompanying sleep disorder.  Trial start mirtazapine we will start low at 15 mg once nightly.  Orders: -  Mirtazapine; Take one po at bedtime  Dispense: 90 tablet; Refill: 0  Multiple falls Assessment & Plan: Patient currently working with physical therapy advised to continue as scheduled.  Rise slowly upon standing and always be sure to use assistive device when getting around the house.  Discussed how to improve falls safety in the home with nonslip rugs, shower chairs and shower handles, bright lighting.      Return if symptoms worsen or fail to improve.  Mort Sawyers, MSN, APRN, FNP-C [facility]

## 2022-12-28 DIAGNOSIS — M1990 Unspecified osteoarthritis, unspecified site: Secondary | ICD-10-CM | POA: Diagnosis not present

## 2022-12-28 DIAGNOSIS — I700 Atherosclerosis of aorta: Secondary | ICD-10-CM | POA: Diagnosis not present

## 2022-12-28 DIAGNOSIS — E785 Hyperlipidemia, unspecified: Secondary | ICD-10-CM | POA: Diagnosis not present

## 2022-12-28 DIAGNOSIS — Z791 Long term (current) use of non-steroidal anti-inflammatories (NSAID): Secondary | ICD-10-CM | POA: Diagnosis not present

## 2022-12-28 DIAGNOSIS — F329 Major depressive disorder, single episode, unspecified: Secondary | ICD-10-CM | POA: Insufficient documentation

## 2022-12-28 DIAGNOSIS — R296 Repeated falls: Secondary | ICD-10-CM | POA: Insufficient documentation

## 2022-12-28 DIAGNOSIS — D509 Iron deficiency anemia, unspecified: Secondary | ICD-10-CM | POA: Diagnosis not present

## 2022-12-28 DIAGNOSIS — S32592D Other specified fracture of left pubis, subsequent encounter for fracture with routine healing: Secondary | ICD-10-CM | POA: Diagnosis not present

## 2022-12-28 DIAGNOSIS — E871 Hypo-osmolality and hyponatremia: Secondary | ICD-10-CM | POA: Insufficient documentation

## 2022-12-28 LAB — BASIC METABOLIC PANEL
BUN: 20 mg/dL (ref 6–23)
CO2: 23 meq/L (ref 19–32)
Calcium: 9.1 mg/dL (ref 8.4–10.5)
Chloride: 98 meq/L (ref 96–112)
Creatinine, Ser: 0.95 mg/dL (ref 0.40–1.50)
GFR: 71.8 mL/min (ref >60.00)
Glucose, Bld: 118 mg/dL — ABNORMAL HIGH (ref 70–99)
Potassium: 4.9 meq/L (ref 3.5–5.1)
Sodium: 131 meq/L — ABNORMAL LOW (ref 135–145)

## 2022-12-28 LAB — IBC + FERRITIN
Ferritin: 68.2 ng/mL (ref 22.0–322.0)
Iron: 119 ug/dL (ref 42–165)
Saturation Ratios: 35 % (ref 20.0–50.0)
TIBC: 340.2 ug/dL (ref 250.0–450.0)
Transferrin: 243 mg/dL (ref 212.0–360.0)

## 2022-12-28 NOTE — Assessment & Plan Note (Signed)
Labs ordered to verify that there is stability with anemia.

## 2022-12-28 NOTE — Assessment & Plan Note (Signed)
Patient currently working with physical therapy advised to continue as scheduled.  Rise slowly upon standing and always be sure to use assistive device when getting around the house.  Discussed how to improve falls safety in the home with nonslip rugs, shower chairs and shower handles, bright lighting.

## 2022-12-28 NOTE — Assessment & Plan Note (Signed)
Continue levothyroxine  Thyroid panel ordered pending results

## 2022-12-28 NOTE — Assessment & Plan Note (Signed)
Suspected health related depression with accompanying sleep disorder.  Trial start mirtazapine we will start low at 15 mg once nightly.

## 2022-12-28 NOTE — Assessment & Plan Note (Signed)
Chronic with stability.  His endocrinologist is retiring so he is requesting an endocrinologist that may be closer to home.  Placed referral

## 2023-01-02 DIAGNOSIS — E785 Hyperlipidemia, unspecified: Secondary | ICD-10-CM | POA: Diagnosis not present

## 2023-01-02 DIAGNOSIS — M1990 Unspecified osteoarthritis, unspecified site: Secondary | ICD-10-CM | POA: Diagnosis not present

## 2023-01-02 DIAGNOSIS — Z791 Long term (current) use of non-steroidal anti-inflammatories (NSAID): Secondary | ICD-10-CM | POA: Diagnosis not present

## 2023-01-02 DIAGNOSIS — Z9181 History of falling: Secondary | ICD-10-CM | POA: Diagnosis not present

## 2023-01-02 DIAGNOSIS — S32592D Other specified fracture of left pubis, subsequent encounter for fracture with routine healing: Secondary | ICD-10-CM | POA: Diagnosis not present

## 2023-01-02 DIAGNOSIS — D509 Iron deficiency anemia, unspecified: Secondary | ICD-10-CM | POA: Diagnosis not present

## 2023-01-02 DIAGNOSIS — I700 Atherosclerosis of aorta: Secondary | ICD-10-CM | POA: Diagnosis not present

## 2023-01-05 ENCOUNTER — Telehealth: Payer: Self-pay | Admitting: Family

## 2023-01-05 DIAGNOSIS — E785 Hyperlipidemia, unspecified: Secondary | ICD-10-CM | POA: Diagnosis not present

## 2023-01-05 DIAGNOSIS — D509 Iron deficiency anemia, unspecified: Secondary | ICD-10-CM | POA: Diagnosis not present

## 2023-01-05 DIAGNOSIS — I700 Atherosclerosis of aorta: Secondary | ICD-10-CM | POA: Diagnosis not present

## 2023-01-05 DIAGNOSIS — M1990 Unspecified osteoarthritis, unspecified site: Secondary | ICD-10-CM | POA: Diagnosis not present

## 2023-01-05 DIAGNOSIS — S32592D Other specified fracture of left pubis, subsequent encounter for fracture with routine healing: Secondary | ICD-10-CM | POA: Diagnosis not present

## 2023-01-05 DIAGNOSIS — Z791 Long term (current) use of non-steroidal anti-inflammatories (NSAID): Secondary | ICD-10-CM | POA: Diagnosis not present

## 2023-01-05 NOTE — Telephone Encounter (Signed)
Pt called in and stated th he is not taking the mirtazapine (REMERON) 15 MG tablet  it doesn't apply to him pt stated he looked the meditation up and dont like the side affects so pt stated he is not going to take it pt would like a call back

## 2023-01-05 NOTE — Telephone Encounter (Signed)
Spoke with pt and he states did not like mirtazpaine. Pt asks for his to removed from his current medication list; this has been done per his request. He does not wish to have any other medications at this time.

## 2023-01-06 NOTE — Telephone Encounter (Signed)
Noted  

## 2023-01-13 ENCOUNTER — Ambulatory Visit: Payer: Medicare Other | Admitting: Podiatry

## 2023-01-13 ENCOUNTER — Encounter: Payer: Self-pay | Admitting: Podiatry

## 2023-01-13 VITALS — Ht 70.0 in | Wt 182.6 lb

## 2023-01-13 DIAGNOSIS — E785 Hyperlipidemia, unspecified: Secondary | ICD-10-CM | POA: Diagnosis not present

## 2023-01-13 DIAGNOSIS — M79674 Pain in right toe(s): Secondary | ICD-10-CM | POA: Diagnosis not present

## 2023-01-13 DIAGNOSIS — M79675 Pain in left toe(s): Secondary | ICD-10-CM

## 2023-01-13 DIAGNOSIS — E1142 Type 2 diabetes mellitus with diabetic polyneuropathy: Secondary | ICD-10-CM | POA: Diagnosis not present

## 2023-01-13 DIAGNOSIS — B351 Tinea unguium: Secondary | ICD-10-CM

## 2023-01-13 DIAGNOSIS — D509 Iron deficiency anemia, unspecified: Secondary | ICD-10-CM | POA: Diagnosis not present

## 2023-01-13 DIAGNOSIS — M1990 Unspecified osteoarthritis, unspecified site: Secondary | ICD-10-CM | POA: Diagnosis not present

## 2023-01-13 DIAGNOSIS — Z791 Long term (current) use of non-steroidal anti-inflammatories (NSAID): Secondary | ICD-10-CM | POA: Diagnosis not present

## 2023-01-13 DIAGNOSIS — S32592D Other specified fracture of left pubis, subsequent encounter for fracture with routine healing: Secondary | ICD-10-CM | POA: Diagnosis not present

## 2023-01-13 DIAGNOSIS — I700 Atherosclerosis of aorta: Secondary | ICD-10-CM | POA: Diagnosis not present

## 2023-01-13 NOTE — Progress Notes (Signed)
  Subjective:  Patient ID: Kyle Simon, male    DOB: 03-25-1932,  MRN: [id number]  89 y.o. male presents at risk foot care with history of diabetic neuropathy and painful thick toenails that are difficult to trim. Pain interferes with ambulation. Aggravating factors include wearing enclosed shoe gear. Pain is relieved with periodic professional debridement. He is accompanied by family member. Patient states he has had two falls since his last visit. First fall resulted in pelvic fx and pubic ramus fx. He was hospitalized then went to rehab. He is now weightbearing with walker most of the time. He is using cane today in office. He relates no new pedal concerns on today's visit. Chief Complaint  Patient presents with   Nail Problem    Pt is here for [facility], last A1C was 6.5 PCP is Dr Alfonse Alpers and LOV was in October.   New problem(s): None   PCP is Mort Sawyers, FNP.  Allergies  Allergen Reactions   Simvastatin Other (See Comments)    MYALGIAS   Oxycodone Other (See Comments)    Severe constipation    Review of Systems: Negative except as noted in the HPI.   Objective:  BASTIEN COODY is a pleasant 89 y.o. male WD, WN in NAD. AAO x 3.  Vascular Examination: Vascular status intact b/l with palpable pedal pulses. CFT immediate b/l. Pedal hair present. No edema. No pain with calf compression b/l. Skin temperature gradient WNL b/l. Varicosities noted b/l. No cyanosis or clubbing noted.  Neurological Examination: Sensation decreased b/l with 10 gram monofilament.   Dermatological Examination: Pedal skin thin and atrophic b/l. No open wounds nor interdigital macerations noted. Toenails 1-5 b/l thick, discolored, elongated with subungual debris and pain on dorsal palpation. No hyperkeratotic lesions noted b/l.   Musculoskeletal Examination: Muscle strength 5/5 to b/l LE.  No pain, crepitus noted b/l. No gross pedal deformities. Patient walking with cane assistance.  Radiographs:  None  Last A1c:      Latest Ref Rng & Units 09/30/2022   10:41 AM 03/25/2022   10:29 AM  Hemoglobin A1C  Hemoglobin-A1c 4.0 - 5.6 % 6.4  6.9    Assessment:   1. Pain due to onychomycosis of toenails of both feet   2. Diabetic peripheral neuropathy associated with type 2 diabetes mellitus (HCC)    Plan:  -Consent given for treatment as described below: -Examined patient. -Continue foot and shoe inspections daily. Monitor blood glucose per PCP/Endocrinologist's recommendations. -Mycotic toenails 1-5 bilaterally were debrided in length and girth with sterile nail nippers and dremel without iatrogenic bleeding. -Patient/POA to call should there be question/concern in the interim.  Return in about 3 months (around 04/15/2023).  Freddie Breech, DPM

## 2023-01-16 ENCOUNTER — Other Ambulatory Visit: Payer: Self-pay | Admitting: Family

## 2023-01-16 DIAGNOSIS — N400 Enlarged prostate without lower urinary tract symptoms: Secondary | ICD-10-CM

## 2023-01-26 ENCOUNTER — Ambulatory Visit
Admission: RE | Admit: 2023-01-26 | Discharge: 2023-01-26 | Disposition: A | Discharge status: Home / Self Care | Payer: Medicare Other | Source: Ambulatory Visit

## 2023-01-26 VITALS — BP 136/81 | HR 71 | Temp 97.6°F | Resp 18

## 2023-01-26 DIAGNOSIS — G8929 Other chronic pain: Secondary | ICD-10-CM

## 2023-01-26 DIAGNOSIS — M79602 Pain in left arm: Secondary | ICD-10-CM | POA: Diagnosis not present

## 2023-01-26 DIAGNOSIS — M25512 Pain in left shoulder: Secondary | ICD-10-CM | POA: Diagnosis not present

## 2023-01-26 MED ORDER — DEXAMETHASONE SODIUM PHOSPHATE 10 MG/ML IJ SOLN
10.0000 mg | Freq: Once | INTRAMUSCULAR | Status: AC
  Administered 2023-01-26: 10 mg via INTRAMUSCULAR

## 2023-01-26 MED ORDER — PREDNISONE 10 MG (21) PO TBPK: ORAL_TABLET | Freq: Every day | ORAL | 0 refills | Status: DC

## 2023-01-26 NOTE — ED Provider Notes (Signed)
Kyle Simon    CSN: [id number] Arrival date & time: 01/26/23  1138      History   Chief Complaint Chief Complaint  Patient presents with   Shoulder Pain    Entered by patient    HPI Kyle Simon is a 89 y.o. male.   Patient presents for evaluation of left-sided shoulder pain radiating into the left arm worsening over the past 2 weeks.  Symptoms initially began on September 5 after fall in which she landed on the left side of his body.  Has been evaluated by orthopedics who completed x-ray imaging, negative at that time but symptoms have not improved.  Did receive intra-articular injection which was somewhat helpful.  Denies new injury or trauma, numbness or tingling.  To complete full range of motion.  Past Medical History:  Diagnosis Date   Arthritis    both hands;Dr.Deveshwar   Constipation due to pain medication    Diabetes mellitus without complication (HCC)    Type II. Uses no medication. Pt controls with diet and weight   Enlarged prostate    Frequent urination at night    GERD (gastroesophageal reflux disease)    History of noncompliance with medical treatment, presenting hazards to health 11/20/2018   Hyperlipidemia associated with type 2 diabetes mellitus (HCC) 04/19/2007   Qualifier: Diagnosis of  By: Alwyn Ren MD, Chrissie Noa     Hypertension    Hypothyroidism    Polio    as a child w/o complications   Snoring    no sleep apnea   Statin declined-  11/20/2018   patient understands risks associated with this decision and has been advised to restart by cardiologist as well as myself several times   Torn meniscus    Transfusion history 2012   post TKR    Patient Active Problem List   Diagnosis Date Noted   Multiple falls 12/28/2022   Reactive depression 12/28/2022   Hyponatremia 12/28/2022   Closed fracture of multiple pubic rami, left, initial encounter (HCC) 12/01/2022   Thoracic aorta atherosclerosis (HCC) 12/01/2022   Calcium blood decreased  12/01/2022   Cerebral infarction, chronic 12/01/2022   Muscle spasm 12/01/2022   Physical debility 12/01/2022   Weakness of both lower extremities 12/01/2022   Unstable gait 12/01/2022   History of fall 12/01/2022   Elevated fecal calprotectin 05/06/2022   Tinnitus of both ears 04/09/2022   Poor dentition 04/02/2022   Memory loss 11/26/2021   Balance problems 12/17/2020   Dyslipidemia 11/30/2020   Hyperkalemia 02/05/2020   Chronic synovitis 01/18/2020   Elevated coronary artery calcium score 11/25/2018   Aortic atherosclerosis (HCC) 10/05/2017   Chronic pain of left knee 08/22/2017   Lumbar radiculopathy 06/16/2017   DM assoc with CKD (chronic kidney disease), stage III (HCC) 04/14/2017   Arthralgia of right temporomandibular joint 03/02/2017   Chronic eczematous otitis externa of both ears 03/02/2017   Presbycusis of both ears 03/02/2017   ETD (Eustachian tube dysfunction), right 09/15/2016   Trochanteric bursitis of right hip 09/07/2016   Hypomagnesemia 12/29/2015   Type 2 diabetes mellitus with other specified complication (HCC) 08/05/2015   Complete tear of right rotator cuff 12/03/2014   Chronic rhinitis 11/01/2014   Hypertension associated with diabetes (HCC) 03/20/2014   Spondylolisthesis of lumbar region 10/23/2013   Diverticulosis of colon without hemorrhage 01/22/2013   Anemia, unspecified 08/13/2012   GERD (gastroesophageal reflux disease) 03/26/2011   SENILE KERATOSIS 06/12/2008   Hyperlipidemia associated with type 2 diabetes mellitus (HCC) 04/19/2007  BPH (benign prostatic hyperplasia) 04/18/2007   Hypothyroidism 04/20/2006    Past Surgical History:  Procedure Laterality Date   ANTERIOR LAT LUMBAR FUSION Right 11/03/2017   Procedure: Right Lumbar three-four Anterolateral lumbar interbody fusion with lateral plate;  Surgeon: Maeola Harman, MD;  Location: [facility] OR;  Service: Neurosurgery;  Laterality: Right;   BACK SURGERY  2011   hemiarthroplasty 01/11/1999 and 6    CARDIAC CATHETERIZATION  09/2001   negative   COLONOSCOPY     COLONOSCOPY N/A 05/06/2022   Procedure: COLONOSCOPY;  Surgeon: Toney Reil, MD;  Location: [facility] ENDOSCOPY;  Service: Gastroenterology;  Laterality: N/A;   COLONOSCOPY WITH PROPOFOL N/A 05/05/2022   Procedure: COLONOSCOPY WITH PROPOFOL;  Surgeon: Toney Reil, MD;  Location: [facility] ENDOSCOPY;  Service: Gastroenterology;  Laterality: N/A;   EYE SURGERY  2003   R&L cataract removed & IOL- 2003 & 2007,Dr.Jenkins   INGUINAL HERNIA REPAIR  2002   right, Dr. Luan Pulling   JOINT REPLACEMENT  2012   left knee replacement   LIPOMA EXCISION     back   MENISCECTOMY Bilateral    Dr. Thurston Hole   PROSTATE SURGERY  07/2006   for urinary urgency, Dr. Tiana Loft CUFF REPAIR Right October, 2016   sinus & throat surgery-1995  1995   TONSILLECTOMY     TOTAL KNEE ARTHROPLASTY  01/11/2011   Procedure: TOTAL KNEE ARTHROPLASTY;  Surgeon: Raymon Mutton, MD;  Location: [facility] OR;  Service: Orthopedics;  Laterality: Left;   TOTAL KNEE ARTHROPLASTY Right 11/22/2016   Procedure: TOTAL KNEE ARTHROPLASTY;  Surgeon: Dannielle Huh, MD;  Location: [facility] OR;  Service: Orthopedics;  Laterality: Right;   UVULECTOMY  1996   Dr. Lazarus Salines   VASECTOMY     WRIST SURGERY  1959   fracture- right   XI ROBOTIC ASSISTED VENTRAL HERNIA N/A 09/07/2020   Procedure: XI ROBOTIC ASSISTED DIAGNOSTIC LAPAROSCOPY;  Surgeon: Leafy Ro, MD;  Location: [facility] ORS;  Service: General;  Laterality: N/A;       Home Medications    Prior to Admission medications   Medication Sig Start Date End Date Taking? Authorizing Provider  finasteride (PROSCAR) 5 MG tablet SMARTSIG:1.0 Tablet(s) By Mouth Daily 01/17/23  Yes [provider]  predniSONE (STERAPRED UNI-PAK 21 TAB) 10 MG (21) TBPK tablet Take by mouth daily. Take 6 tabs by mouth daily  for 1 days, then 5 tabs for 1 days, then 4 tabs for 1 days, then 3 tabs for 1 days, 2 tabs for 1 days, then 1 tab by mouth  daily for 1 days 01/26/23  Yes Haziel Molner R, NP  celecoxib (CELEBREX) 100 MG capsule Take 100 mg by mouth 2 (two) times daily.    [provider]  CLOBETASOL PROPIONATE E 0.05 % emollient cream as needed. 07/09/21   [provider]  diclofenac Sodium (VOLTAREN) 1 % GEL Apply 2 g topically at bedtime. 07/13/22   Mort Sawyers, FNP  Fe Fum-Vit C-Vit B12-FA (TRIGELS-F FORTE) CAPS capsule Take 1 capsule by mouth daily after breakfast. 11/09/22   Arnetha Courser, MD  fludrocortisone (FLORINEF) 0.1 MG tablet 1 tablet 3 days a week 11/22/22   Earnestine Mealing, MD  fluticasone [facility]) 50 MCG/ACT nasal spray Place 2 sprays into both nostrils daily. 08/18/22   [provider]  Hypromellose (ARTIFICIAL TEARS OP) Place 1 drop into both eyes 2 (two) times daily.    [provider]  levothyroxine (SYNTHROID) 137 MCG tablet Take 1 tablet (137 mcg total) by  mouth daily before breakfast. 11/22/22   Earnestine Mealing, MD  lidocaine (LIDODERM) 5 % Place 1 patch onto the skin every 12 (twelve) hours. Remove & Discard patch within 12 hours or as directed by MD 08/17/22 08/17/23  Mort Sawyers, FNP  lipase/protease/amylase (CREON) 36000 UNITS CPEP capsule Take 1 capsule (36,000 Units total) by mouth 3 (three) times daily before meals. 11/22/22   Earnestine Mealing, MD  Omega-3 Fatty Acids (FISH OIL) 1000 MG CAPS Take 1,000 mg by mouth every evening.    [provider]  rosuvastatin (CRESTOR) 5 MG tablet Take 1 tablet (5 mg total) by mouth at bedtime. 11/22/22   Earnestine Mealing, MD  tamsulosin (FLOMAX) 0.4 MG CAPS capsule TAKE 1 CAPSULE TWICE A DAY 01/17/23   Mort Sawyers, FNP  valsartan (DIOVAN) 320 MG tablet Take 1 tablet (320 mg total) by mouth daily. 11/22/22   Earnestine Mealing, MD    Family History Family History  Problem Relation Age of Onset   Coronary artery disease Mother    Emphysema Mother    Diverticulosis Mother    Thyroid disease Mother    Sudden death Father         accident   Asthma Brother    Bone cancer Brother    Thyroid disease Daughter    Sarcoidosis Son    Diabetes Paternal Grandmother    Anesthesia problems Neg Hx    Hypotension Neg Hx    Malignant hyperthermia Neg Hx    Pseudochol deficiency Neg Hx    Colon cancer Neg Hx     Social History Social History   Tobacco Use   Smoking status: Former    Current packs/day: 0.00    Average packs/day: 1 pack/day for 18.0 years (18.0 ttl pk-yrs)    Types: Cigarettes    Start date: 03/01/1948    Quit date: 03/01/1966    Years since quitting: 56.9    Passive exposure: Never   Smokeless tobacco: Never  Vaping Use   Vaping status: Never Used  Substance Use Topics   Alcohol use: Yes    Alcohol/week: 4.0 standard drinks of alcohol    Types: 4 Shots of liquor per week    Comment: social, few drinks a week   Drug use: No     Allergies   Simvastatin and Oxycodone   Review of Systems Review of Systems   Physical Exam Triage Vital Signs ED Triage Vitals  Encounter Vitals Group     BP 01/26/23 1203 136/81     Systolic BP Percentile --      Diastolic BP Percentile --      Pulse Rate 01/26/23 1203 71     Resp 01/26/23 1203 18     Temp 01/26/23 1203 97.6 F (36.4 C)     Temp Source 01/26/23 1203 Oral     SpO2 01/26/23 1203 93 %     Weight --      Height --      Head Circumference --      Peak Flow --      Pain Score 01/26/23 1201 6     Pain Loc --      Pain Education --      Exclude from Growth Chart --    No data found.  Updated Vital Signs BP 136/81 (BP Location: Left Arm)   Pulse 71   Temp 97.6 F (36.4 C) (Oral)   Resp 18   SpO2 93%   Visual Acuity Right Eye Distance:  Left Eye Distance:   Bilateral Distance:    Right Eye Near:   Left Eye Near:    Bilateral Near:     Physical Exam Constitutional:      Appearance: Normal appearance.  Eyes:     Extraocular Movements: Extraocular movements intact.  Pulmonary:     Effort: Pulmonary effort is normal.   Musculoskeletal:     Comments: Unable to reproduce tenderness to the left shoulder, no ecchymosis swelling or deformity, able to complete full range of motion, 2+ brachial Kolls, negative Hawking sign  Point tenderness along the posterior of the left upper arm, no ecchymosis swelling or deformity, 2+ brachial pulse, strength is a 4 out of 5  Neurological:     Mental Status: He is alert and oriented to person, place, and time. Mental status is at baseline.      UC Treatments / Results  Labs (all labs ordered are listed, but only abnormal results are displayed) Labs Reviewed - No data to display  EKG   Radiology No results found.  Procedures Procedures (including critical care time)  Medications Ordered in UC Medications  dexamethasone (DECADRON) injection 10 mg (has no administration in time range)    Initial Impression / Assessment and Plan / UC Course  I have reviewed the triage vital signs and the nursing notes.  Pertinent labs & imaging results that were available during my care of the patient were reviewed by me and considered in my medical decision making (see chart for details).  Chronic Left shoulder pain, left arm pain  Denies new injury therefore imaging deferred, etiology most likely muscular, Decadron IM and prednisone taper prescribed, recommended RICE, heat massage stretching with activity as tolerated, symptoms continue to persist he has to follow-up with his orthopedic specialist also given information to the walk-in urgent care Final Clinical Impressions(s) / UC Diagnoses   Final diagnoses:  Chronic left shoulder pain  Left arm pain     Discharge Instructions      Your pain is most likely caused by irritation to the muscles.  Been given an injection of Decadron which is a steroid to help reduce inflammation and pain and ideally will see some improvement within our  Starting tomorrow take prednisone every morning with food as directed, may take  Tylenol or use any topical medicines in addition to this  You may use heating pad in 15 minute intervals as needed for additional comfort, or  you may find comfort in using ice in 10-15 minutes over affected area  Begin massaging and  stretching affected area daily for 10 minutes as tolerated to further loosen muscles   When sitting and  lying down place pillow underneath arm and behind back  Can try sleeping without pillow on firm mattress   Practice good posture: head back, shoulders back, chest forward, pelvis back and weight distributed evenly on both legs  If pain persist after recommended treatment or reoccurs if may be beneficial to follow up with orthopedic specialist for evaluation, this doctor specializes in the bones and can manage your symptoms long-term with options such as but not limited to imaging, medications or physical therapy      ED Prescriptions     Medication Sig Dispense Auth. Provider   predniSONE (STERAPRED UNI-PAK 21 TAB) 10 MG (21) TBPK tablet Take by mouth daily. Take 6 tabs by mouth daily  for 1 days, then 5 tabs for 1 days, then 4 tabs for 1 days, then 3 tabs for 1  days, 2 tabs for 1 days, then 1 tab by mouth daily for 1 days 21 tablet Daylin Gruszka, Elita Boone, NP      PDMP not reviewed this encounter.   Valinda Hoar, NP 01/26/23 1229

## 2023-01-26 NOTE — ED Triage Notes (Signed)
Pt presents with right shoulder pain x 2 weeks. The pain has gotten worse over the past few days. Pt has received injections in his shoulder for pain in the past.

## 2023-01-26 NOTE — Discharge Instructions (Addendum)
Your pain is most likely caused by irritation to the muscles.  Been given an injection of Decadron which is a steroid to help reduce inflammation and pain and ideally will see some improvement within our  Starting tomorrow take prednisone every morning with food as directed, may take Tylenol or use any topical medicines in addition to this  You may use heating pad in 15 minute intervals as needed for additional comfort, or  you may find comfort in using ice in 10-15 minutes over affected area  Begin massaging and  stretching affected area daily for 10 minutes as tolerated to further loosen muscles   When sitting and  lying down place pillow underneath arm and behind back  Can try sleeping without pillow on firm mattress   Practice good posture: head back, shoulders back, chest forward, pelvis back and weight distributed evenly on both legs  If pain persist after recommended treatment or reoccurs if may be beneficial to follow up with orthopedic specialist for evaluation, this doctor specializes in the bones and can manage your symptoms long-term with options such as but not limited to imaging, medications or physical therapy

## 2023-02-08 ENCOUNTER — Telehealth: Payer: Self-pay | Admitting: Family

## 2023-02-08 ENCOUNTER — Encounter: Payer: Self-pay | Admitting: *Deleted

## 2023-02-08 DIAGNOSIS — R5381 Other malaise: Secondary | ICD-10-CM

## 2023-02-08 DIAGNOSIS — M25512 Pain in left shoulder: Secondary | ICD-10-CM | POA: Diagnosis not present

## 2023-02-08 DIAGNOSIS — R2681 Unsteadiness on feet: Secondary | ICD-10-CM

## 2023-02-08 DIAGNOSIS — R296 Repeated falls: Secondary | ICD-10-CM

## 2023-02-08 DIAGNOSIS — R29898 Other symptoms and signs involving the musculoskeletal system: Secondary | ICD-10-CM

## 2023-02-08 NOTE — Telephone Encounter (Signed)
Pt called stating he use to go to [facility] after falling on 11/04/22 for balance. Pt states he's having issues with his balance again & contacted facility to schedule a session. Pt states he was told a referral is going to be needed from [facility] before being seen. Pt states the hosp referred him when he started seeing this before. Pt's preferred office location is [address], [place]. Fax # is [phone]. Call back # [phone]

## 2023-02-08 NOTE — Telephone Encounter (Signed)
No problem I have placed referral as requested!

## 2023-02-09 NOTE — Telephone Encounter (Signed)
Spoke with pt and he is aware that Kyle Simon has placed this referral.

## 2023-02-21 DIAGNOSIS — M25512 Pain in left shoulder: Secondary | ICD-10-CM | POA: Diagnosis not present

## 2023-02-28 ENCOUNTER — Other Ambulatory Visit: Payer: Self-pay | Admitting: *Deleted

## 2023-02-28 DIAGNOSIS — E039 Hypothyroidism, unspecified: Secondary | ICD-10-CM

## 2023-02-28 DIAGNOSIS — R2681 Unsteadiness on feet: Secondary | ICD-10-CM | POA: Diagnosis not present

## 2023-02-28 DIAGNOSIS — M6281 Muscle weakness (generalized): Secondary | ICD-10-CM | POA: Diagnosis not present

## 2023-02-28 MED ORDER — LEVOTHYROXINE SODIUM 137 MCG PO TABS: 137.0000 ug | ORAL_TABLET | Freq: Every day | ORAL | 0 refills | Status: DC

## 2023-03-07 ENCOUNTER — Ambulatory Visit: Payer: Medicare Other | Admitting: Family

## 2023-03-09 ENCOUNTER — Encounter: Payer: Self-pay | Admitting: Endocrinology

## 2023-03-09 ENCOUNTER — Ambulatory Visit (INDEPENDENT_AMBULATORY_CARE_PROVIDER_SITE_OTHER): Payer: Medicare Other | Admitting: Endocrinology

## 2023-03-09 VITALS — BP 110/60 | HR 78 | Resp 20 | Ht 70.0 in | Wt 187.2 lb

## 2023-03-09 DIAGNOSIS — M6281 Muscle weakness (generalized): Secondary | ICD-10-CM | POA: Diagnosis not present

## 2023-03-09 DIAGNOSIS — E063 Autoimmune thyroiditis: Secondary | ICD-10-CM

## 2023-03-09 DIAGNOSIS — R2681 Unsteadiness on feet: Secondary | ICD-10-CM | POA: Diagnosis not present

## 2023-03-09 DIAGNOSIS — E119 Type 2 diabetes mellitus without complications: Secondary | ICD-10-CM | POA: Diagnosis not present

## 2023-03-09 DIAGNOSIS — E871 Hypo-osmolality and hyponatremia: Secondary | ICD-10-CM | POA: Diagnosis not present

## 2023-03-09 NOTE — Progress Notes (Signed)
 Outpatient Endocrinology Note Cesilia Shinn, MD   Patient's Name: Kyle Simon    DOB: 11/19/1932    MRN: [id number]  REASON OF VISIT:  Follow-up for hypothyroidism /hyponatremia.  PCP: Corwin Antu, FNP  HISTORY OF PRESENT ILLNESS:   Kyle Simon is a 89 y.o. old male with past medical history as listed below is presented for a follow up for hypothyroidism / hyponatremia.   Pertinent  History: Patient was previously seen by Dr. Von and was last time seen in August 2024. 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 Serum sodium has been low since at least 2011 and has been as low as 125 in the past.  Mostly in the range of low 130s. He has not had any associated symptoms of decreased appetite, nausea or drowsiness Not on any thiazide diuretics or SSRI drugs. Has had negative endocrine work-up for cortisol deficiencies. Initially sodium was significantly better after starting DEMECLOCYCLINE 150 mg daily in 9/21. However this was stopped when his sodium decreased on the treatment down to 126    His urine osmolality was previously high at 247, Urine osmolality has been as high as 551 previously Urine sodium was 130 previously.   He has been advised cut back on his fluid intake especially coffee and also large volumes of water  which he was drinking for his constipation.   With a trial of salt tablets his sodium did not improve, it was still down to 126 in 9/21, also was having nausea from taking more than 1 regular tablet daily.   He was empirically started on FLORINEF  in April 2023 because of persistently low sodium, mild increase in potassium and history of relatively high urine sodium.  CT scan of chest did not show any tumor.  He is now taking fludrocortisone  3 times a week  # Primary hypothyroidism -Diagnosed in 2005. At the time of diagnosis patient was not having symptoms of  fatigue, cold sensitivity, difficulty concentrating, dry skin, weight gain. Likely had only done routine  testing and was told that he was hypothyroid and started him on unknown dose of levothyroxine . He did not start feeling any different when he started taking levothyroxine  initially. Also usually does not feel any different with dosage adjustments that have been made. He was referred here because his TSH was tending to be high but also his free T4 was relatively higher in 7/18. On his initial consultation since his TSH was still relatively high and he was taking the equivalent of about 142 g of levothyroxine  daily he was switched to taking 150 g once daily, this has been periodically adjusted   He is now on 137 mcg levothyroxine  daily.   # Type 2 diabetes mellitus on diet control not on antidiabetic medication, he has diabetes for several years.  He has controlled type 2 diabetes mellitus.  He has been monitoring blood sugar at home has One Touch Verio glucometer.  Interval history  Patient has been taking Synthroid  137 mcg daily in the morning.  He has been taking 4 units 3 times a week.  Reports compliance with medication.  Overall feeling good.  He is being evaluated by ENT, for possible nasal polyp.  Thyroid  function test in October was normal.  Serum sodium was very stable 131.  No other complaints today.  Monitoring blood sugar at home and reports blood sugar mostly in 90s.  In the morning fasting.  REVIEW OF SYSTEMS:  As per history of present illness.   PAST MEDICAL HISTORY:  Past Medical History:  Diagnosis Date   Arthritis    both hands;Dr.Deveshwar   Constipation due to pain medication    Diabetes mellitus without complication (HCC)    Type II. Uses no medication. Pt controls with diet and weight   Enlarged prostate    Frequent urination at night    GERD (gastroesophageal reflux disease)    History of noncompliance with medical treatment, presenting hazards to health 11/20/2018   Hyperlipidemia associated with type 2 diabetes mellitus (HCC) 04/19/2007   Qualifier: Diagnosis of   By: Tish MD, William     Hypertension    Hypothyroidism    Polio    as a child w/o complications   Snoring    no sleep apnea   Statin declined-  11/20/2018   patient understands risks associated with this decision and has been advised to restart by cardiologist as well as myself several times   Torn meniscus    Transfusion history 2012   post TKR    PAST SURGICAL HISTORY: Past Surgical History:  Procedure Laterality Date   ANTERIOR LAT LUMBAR FUSION Right 11/03/2017   Procedure: Right Lumbar three-four Anterolateral lumbar interbody fusion with lateral plate;  Surgeon: Unice Pac, MD;  Location: [facility] OR;  Service: Neurosurgery;  Laterality: Right;   BACK SURGERY  2011   hemiarthroplasty 01/11/1999 and 6   CARDIAC CATHETERIZATION  09/2001   negative   COLONOSCOPY     COLONOSCOPY N/A 05/06/2022   Procedure: COLONOSCOPY;  Surgeon: Unk Corinn Skiff, MD;  Location: [facility] ENDOSCOPY;  Service: Gastroenterology;  Laterality: N/A;   COLONOSCOPY WITH PROPOFOL  N/A 05/05/2022   Procedure: COLONOSCOPY WITH PROPOFOL ;  Surgeon: Unk Corinn Skiff, MD;  Location: [facility] ENDOSCOPY;  Service: Gastroenterology;  Laterality: N/A;   EYE SURGERY  2003   R&L cataract removed & IOL- 2003 & 2007,Dr.Jenkins   INGUINAL HERNIA REPAIR  2002   right, Dr. Honore   JOINT REPLACEMENT  2012   left knee replacement   LIPOMA EXCISION     back   MENISCECTOMY Bilateral    Dr. Jane   PROSTATE SURGERY  07/2006   for urinary urgency, Dr. Alline FRIED CUFF REPAIR Right October, 2016   sinus & throat surgery-1995  1995   TONSILLECTOMY     TOTAL KNEE ARTHROPLASTY  01/11/2011   Procedure: TOTAL KNEE ARTHROPLASTY;  Surgeon: Garnette JONETTA Raman, MD;  Location: [facility] OR;  Service: Orthopedics;  Laterality: Left;   TOTAL KNEE ARTHROPLASTY Right 11/22/2016   Procedure: TOTAL KNEE ARTHROPLASTY;  Surgeon: Raman Kemps, MD;  Location: [facility] OR;  Service: Orthopedics;  Laterality: Right;   UVULECTOMY  1996   Dr. Arlana    VASECTOMY     WRIST SURGERY  1959   fracture- right   XI ROBOTIC ASSISTED VENTRAL HERNIA N/A 09/07/2020   Procedure: XI ROBOTIC ASSISTED DIAGNOSTIC LAPAROSCOPY;  Surgeon: Jordis Laneta FALCON, MD;  Location: [facility] ORS;  Service: General;  Laterality: N/A;    ALLERGIES: Allergies  Allergen Reactions   Simvastatin Other (See Comments)    MYALGIAS   Oxycodone  Other (See Comments)    Severe constipation    FAMILY HISTORY:  Family History  Problem Relation Age of Onset   Coronary artery disease Mother    Emphysema Mother    Diverticulosis Mother    Thyroid  disease Mother    Sudden death Father        accident   Asthma Brother    Bone cancer Brother    Thyroid  disease Daughter  Sarcoidosis Son    Diabetes Paternal Grandmother    Anesthesia problems Neg Hx    Hypotension Neg Hx    Malignant hyperthermia Neg Hx    Pseudochol deficiency Neg Hx    Colon cancer Neg Hx     SOCIAL HISTORY: Social History   Socioeconomic History   Marital status: Married    Spouse name: June   Number of children: 7   Years of education: 2 years college   Highest education level: Not on file  Occupational History   Occupation: retired  Tobacco Use   Smoking status: Former    Current packs/day: 0.00    Average packs/day: 1 pack/day for 18.0 years (18.0 ttl pk-yrs)    Types: Cigarettes    Start date: 03/01/1948    Quit date: 03/01/1966    Years since quitting: 57.0    Passive exposure: Never   Smokeless tobacco: Never  Vaping Use   Vaping status: Never Used  Substance and Sexual Activity   Alcohol  use: Yes    Alcohol /week: 4.0 standard drinks of alcohol     Types: 4 Shots of liquor per week    Comment: social, few drinks a week   Drug use: No   Sexual activity: Yes    Birth control/protection: Post-menopausal, Surgical  Other Topics Concern   Not on file  Social History Narrative   09/20/19   From: [place]   Living: with wife June (1985)   Work: retired  from Company secretary, 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      Family: 5 children (one passed away) 2 stepchildren, 14 grandchildren, 9 great-grandchildren - museum/gallery conservator and granddaughter nearby otherwise across the country       Enjoys: fly and play golf, reading      Exercise: walking the dog   Diet: diabetic diet      Safety   Seat belts: Yes    Guns: Yes  and secure   Safe in relationships: Yes    Social Drivers of Corporate Investment Banker Strain: Low Risk  (09/09/2022)   Overall Financial Resource Strain (CARDIA)    Difficulty of Paying Living Expenses: Not hard at all  Food Insecurity: No Food Insecurity (11/05/2022)   Hunger Vital Sign    Worried About Running Out of Food in the Last Year: Never true    Ran Out of Food in the Last Year: Never true  Transportation Needs: No Transportation Needs (11/05/2022)   PRAPARE - Administrator, Civil Service (Medical): No    Lack of Transportation (Non-Medical): No  Physical Activity: Sufficiently Active (09/09/2022)   Exercise Vital Sign    Days of Exercise per Week: 3 days    Minutes of Exercise per Session: 60 min  Stress: No Stress Concern Present (09/09/2022)   [employer] of Occupational Health - Occupational Stress Questionnaire    Feeling of Stress : Not at all  Social Connections: Socially Integrated (09/09/2022)   Social Connection and Isolation Panel [NHANES]    Frequency of Communication with Friends and Family: More than three times a week    Frequency of Social Gatherings with Friends and Family: More than three times a week    Attends Religious Services: More than 4 times per year    Active Member of [employer] or Organizations: Yes    Attends Banker Meetings: Never    Marital Status: Married    MEDICATIONS:  Current Outpatient Medications  Medication Sig Dispense Refill  celecoxib  (CELEBREX ) 100 MG capsule Take 100 mg by mouth 2 (two) times daily.     CLOBETASOL  PROPIONATE E 0.05 % emollient  cream as needed.     diclofenac  Sodium (VOLTAREN ) 1 % GEL Apply 2 g topically at bedtime. 100 g 2   Fe Fum-Vit C-Vit B12-FA (TRIGELS-F FORTE) CAPS capsule Take 1 capsule by mouth daily after breakfast.     finasteride  (PROSCAR ) 5 MG tablet SMARTSIG:1.0 Tablet(s) By Mouth Daily     fludrocortisone  (FLORINEF ) 0.1 MG tablet 1 tablet 3 days a week 7 tablet 0   fluticasone  (FLONASE ) 50 MCG/ACT nasal spray Place 2 sprays into both nostrils daily.     Hypromellose (ARTIFICIAL TEARS OP) Place 1 drop into both eyes 2 (two) times daily.     levothyroxine  (SYNTHROID ) 137 MCG tablet Take 1 tablet (137 mcg total) by mouth daily before breakfast. 90 tablet 0   lidocaine  (LIDODERM ) 5 % Place 1 patch onto the skin every 12 (twelve) hours. Remove & Discard patch within 12 hours or as directed by MD 180 patch 3   lipase/protease/amylase (CREON ) 36000 UNITS CPEP capsule Take 1 capsule (36,000 Units total) by mouth 3 (three) times daily before meals. 450 capsule 0   Omega-3 Fatty Acids (FISH OIL) 1000 MG CAPS Take 1,000 mg by mouth every evening.     predniSONE  (STERAPRED UNI-PAK 21 TAB) 10 MG (21) TBPK tablet Take by mouth daily. Take 6 tabs by mouth daily  for 1 days, then 5 tabs for 1 days, then 4 tabs for 1 days, then 3 tabs for 1 days, 2 tabs for 1 days, then 1 tab by mouth daily for 1 days 21 tablet 0   rosuvastatin  (CRESTOR ) 5 MG tablet Take 1 tablet (5 mg total) by mouth at bedtime. 7 tablet 0   tamsulosin  (FLOMAX ) 0.4 MG CAPS capsule TAKE 1 CAPSULE TWICE A DAY 180 capsule 1   valsartan  (DIOVAN ) 320 MG tablet Take 1 tablet (320 mg total) by mouth daily. 7 tablet 0   No current facility-administered medications for this visit.    PHYSICAL EXAM: Vitals:   03/09/23 1410 03/09/23 1416  BP: 110/60   Pulse:  78  Resp: 20   SpO2: (!) 42% 98%  Weight: 187 lb 3.2 oz (84.9 kg)   Height: 5' 10 (1.778 m)    Body mass index is 26.86 kg/m.  Wt Readings from Last 3 Encounters:  03/09/23 187 lb 3.2 oz (84.9 kg)   01/13/23 182 lb 9.6 oz (82.8 kg)  12/27/22 182 lb 9.6 oz (82.8 kg)    General: Well developed, well nourished male in no apparent distress.  HEENT: AT/[place], no external lesions.  Eyes: Conjunctiva clear and no icterus. Lungs: Clear to auscultation, no wheeze. Respirations not labored Heart: S1S2, Regular in rate and rhythm.  Abdomen: Soft, non tender Neurologic: Alert, oriented, normal speech,  no gross focal neurological deficit Extremities: No pedal pitting edema, no tremors of outstretched hands Skin: Warm, color good. Psychiatric: Does not appear depressed or anxious  PERTINENT HISTORIC LABORATORY AND IMAGING STUDIES:  All pertinent laboratory results were reviewed. Please see HPI also for further details.   TSH  Date Value Ref Range Status  12/27/2022 2.76 0.35 - 5.50 uIU/mL Final  09/27/2022 0.79 0.35 - 5.50 uIU/mL Final  06/28/2022 1.63 0.35 - 5.50 uIU/mL Final     ASSESSMENT / PLAN  1. Hyponatremia   2. Acquired autoimmune hypothyroidism   3. Diabetes mellitus type II, non insulin  dependent (HCC)    #  Hyponatremia likely SIADH -Patient has longstanding hyponatremia, mostly serum sodium is stable at low 130s.Usually asymptomatic. -Continue Florinef  0.1 mg 3 times a week. -Discussed about avoiding excessive intake of fluid/water /coffee. -Check BMP with EGFR.  # Primary hypothyroidism -Currently taking Synthroid  brand name 137 mcg daily. -Check thyroid  function test  # Type 2 diabetes mellitus -Controlled, currently is not on antidiabetic medications. -Will check hemoglobin A1c today.  Diagnoses and all orders for this visit:  Hyponatremia -     BASIC METABOLIC PANEL WITH GFR -     BASIC METABOLIC PANEL WITH GFR  Acquired autoimmune hypothyroidism -     T4, free -     TSH -     T4, free -     TSH  Diabetes mellitus type II, non insulin  dependent (HCC) -     Hemoglobin A1c -     Microalbumin / creatinine urine ratio    DISPOSITION Follow up in  clinic in 4 months suggested.  Labs today and prior to follow-up visit.  All questions answered and patient verbalized understanding of the plan.  Kyle Rauls, MD [facility] Endocrinology [facility] [address] [place], [address] [place], [place] [address] Phone # [phone]  At least part of this note was generated using voice recognition software. Inadvertent word errors may have occurred, which were not recognized during the proofreading process.

## 2023-03-09 NOTE — Progress Notes (Signed)
 Patient's pulse reading low, however per patient its normal for him. The O2 reading was reading low trouble capturing it after multiple attempts. Per MD re-apply pulse ox and allow to stay on during visit to see if able to capture any numbers.

## 2023-03-10 ENCOUNTER — Ambulatory Visit (INDEPENDENT_AMBULATORY_CARE_PROVIDER_SITE_OTHER): Payer: Medicare Other | Admitting: Family

## 2023-03-10 ENCOUNTER — Encounter: Payer: Self-pay | Admitting: Family

## 2023-03-10 VITALS — BP 92/66 | HR 64 | Temp 98.1°F | Ht 70.0 in | Wt 185.0 lb

## 2023-03-10 DIAGNOSIS — E785 Hyperlipidemia, unspecified: Secondary | ICD-10-CM

## 2023-03-10 DIAGNOSIS — E1169 Type 2 diabetes mellitus with other specified complication: Secondary | ICD-10-CM | POA: Diagnosis not present

## 2023-03-10 DIAGNOSIS — H6991 Unspecified Eustachian tube disorder, right ear: Secondary | ICD-10-CM

## 2023-03-10 DIAGNOSIS — J3489 Other specified disorders of nose and nasal sinuses: Secondary | ICD-10-CM | POA: Diagnosis not present

## 2023-03-10 DIAGNOSIS — J324 Chronic pansinusitis: Secondary | ICD-10-CM | POA: Insufficient documentation

## 2023-03-10 DIAGNOSIS — E871 Hypo-osmolality and hyponatremia: Secondary | ICD-10-CM | POA: Diagnosis not present

## 2023-03-10 DIAGNOSIS — E039 Hypothyroidism, unspecified: Secondary | ICD-10-CM | POA: Diagnosis not present

## 2023-03-10 LAB — TSH: TSH: 1.21 m[IU]/L (ref 0.40–4.50)

## 2023-03-10 LAB — BASIC METABOLIC PANEL WITH GFR
BUN/Creatinine Ratio: 29 (calc) — ABNORMAL HIGH (ref 6–22)
BUN: 28 mg/dL — ABNORMAL HIGH (ref 7–25)
CO2: 24 mmol/L (ref 20–32)
Calcium: 8.7 mg/dL (ref 8.6–10.3)
Chloride: 100 mmol/L (ref 98–110)
Creat: 0.95 mg/dL (ref 0.70–1.22)
Glucose, Bld: 152 mg/dL — ABNORMAL HIGH (ref 65–99)
Potassium: 5.1 mmol/L (ref 3.5–5.3)
Sodium: 132 mmol/L — ABNORMAL LOW (ref 135–146)
eGFR: 77 mL/min/{1.73_m2} (ref >=60)

## 2023-03-10 LAB — HEMOGLOBIN A1C
Hgb A1c MFr Bld: 7.1 %{Hb} — ABNORMAL HIGH (ref <5.7)
Mean Plasma Glucose: 157 mg/dL
eAG (mmol/L): 8.7 mmol/L

## 2023-03-10 LAB — T4, FREE: Free T4: 1.8 ng/dL (ref 0.8–1.8)

## 2023-03-10 NOTE — Assessment & Plan Note (Signed)
 Table asymptomatic  Continue florinef and continue f/u with endo as scheduled.

## 2023-03-10 NOTE — Assessment & Plan Note (Signed)
 Cont f/u with Ent.  Pt also requesting second opinion.  Continue with nasal saline and neti pot a few times weekly.

## 2023-03-10 NOTE — Patient Instructions (Addendum)
  Get some saline nasal spray and try this twice daily.   A referral was placed today for ENT Please let us know if you have not heard back within 2 weeks about the referral.

## 2023-03-10 NOTE — Assessment & Plan Note (Signed)
 Stable at 7.1  Ordered hga1c today pending results. Work on diabetic diet and exercise as tolerated. Yearly foot exam, and annual eye exam.  Maintain f/u with endo

## 2023-03-10 NOTE — Assessment & Plan Note (Signed)
 Stable.  Work on [employer] exercise as tolerated.

## 2023-03-10 NOTE — Progress Notes (Signed)
 Established Patient Office Visit  Subjective:      CC:  Chief Complaint  Patient presents with   Medical Management of Chronic Issues    HPI: Kyle Simon is a 89 y.o. male presenting on 03/10/2023 for Medical Management of Chronic Issues  Left nares, with polyp ongoing. Has seen Ent. He is using a neti pot currently but he states is not very easy to do this twice daily. He does at times note blood discharge. He did state that ENT stated surgery would be needed to remove polyp however was hesitant to proceed with this surgery due to pt's age. He would like a second opinion, was a previous pt of Dr. Arlana.   Reactive depression: last visit in October started on remeron  15 mg once daily.   Lab Results  Component Value Date   HGBA1C 7.1 (H) 03/09/2023   Hyponatremia, recently seen yesterday with new endo Dr. Doreene. Suspected r/t SIADH, advised to continue florinef  0.1 mg three times a week.   Hypothyroid, now managed by endo as well. On synthroid  137 mcg once daily.   Lab Results  Component Value Date   TSH 1.21 03/09/2023     Lab Results  Component Value Date   CHOL 99 12/01/2022   HDL 49.00 12/01/2022   LDLCALC 29 12/01/2022   LDLDIRECT 121 (H) 11/15/2018   TRIG 103.0 12/01/2022   CHOLHDL 2 12/01/2022   Last metabolic panel Lab Results  Component Value Date   GLUCOSE 152 (H) 03/09/2023   NA 132 (L) 03/09/2023   K 5.1 03/09/2023   CL 100 03/09/2023   CO2 24 03/09/2023   BUN 28 (H) 03/09/2023   CREATININE 0.95 03/09/2023   EGFR 77 03/09/2023   CALCIUM  8.7 03/09/2023   PHOS 2.9 02/04/2016   PROT 6.3 (L) 11/05/2022   ALBUMIN 3.5 11/05/2022   LABGLOB 3.0 02/16/2019   AGRATIO 1.5 02/16/2019   BILITOT 0.7 11/05/2022   ALKPHOS 64 11/05/2022   AST 17 11/05/2022   ALT 14 11/05/2022   ANIONGAP 7 11/05/2022       Social history:  Relevant past medical, surgical, family and social history reviewed and updated as indicated. Interim medical history since  our last visit reviewed.  Allergies and medications reviewed and updated.  DATA REVIEWED: CHART IN EPIC     ROS: Negative unless specifically indicated above in HPI.    Current Outpatient Medications:    celecoxib  (CELEBREX ) 100 MG capsule, Take 100 mg by mouth 2 (two) times daily., Disp: , Rfl:    CLOBETASOL  PROPIONATE E 0.05 % emollient cream, as needed., Disp: , Rfl:    diclofenac  Sodium (VOLTAREN ) 1 % GEL, Apply 2 g topically at bedtime., Disp: 100 g, Rfl: 2   Fe Fum-Vit C-Vit B12-FA (TRIGELS-F FORTE) CAPS capsule, Take 1 capsule by mouth daily after breakfast., Disp: , Rfl:    finasteride  (PROSCAR ) 5 MG tablet, SMARTSIG:1.0 Tablet(s) By Mouth Daily, Disp: , Rfl:    fludrocortisone  (FLORINEF ) 0.1 MG tablet, 1 tablet 3 days a week, Disp: 7 tablet, Rfl: 0   fluticasone  (FLONASE ) 50 MCG/ACT nasal spray, Place 2 sprays into both nostrils daily., Disp: , Rfl:    Hypromellose (ARTIFICIAL TEARS OP), Place 1 drop into both eyes 2 (two) times daily., Disp: , Rfl:    levothyroxine  (SYNTHROID ) 137 MCG tablet, Take 1 tablet (137 mcg total) by mouth daily before breakfast., Disp: 90 tablet, Rfl: 0   lidocaine  (LIDODERM ) 5 %, Place 1 patch onto the skin  every 12 (twelve) hours. Remove & Discard patch within 12 hours or as directed by MD, Disp: 180 patch, Rfl: 3   lipase/protease/amylase (CREON ) 36000 UNITS CPEP capsule, Take 1 capsule (36,000 Units total) by mouth 3 (three) times daily before meals., Disp: 450 capsule, Rfl: 0   Omega-3 Fatty Acids (FISH OIL) 1000 MG CAPS, Take 1,000 mg by mouth every evening., Disp: , Rfl:    rosuvastatin  (CRESTOR ) 5 MG tablet, Take 1 tablet (5 mg total) by mouth at bedtime., Disp: 7 tablet, Rfl: 0   tamsulosin  (FLOMAX ) 0.4 MG CAPS capsule, TAKE 1 CAPSULE TWICE A DAY, Disp: 180 capsule, Rfl: 1   valsartan  (DIOVAN ) 320 MG tablet, Take 1 tablet (320 mg total) by mouth daily., Disp: 7 tablet, Rfl: 0      Objective:    BP 92/66 (BP Location: Left Arm, Patient  Position: Sitting, Cuff Size: Normal)   Pulse 64   Temp 98.1 F (36.7 C) (Temporal)   Ht 5' 10 (1.778 m)   Wt 185 lb (83.9 kg)   SpO2 99%   BMI 26.54 kg/m   Wt Readings from Last 3 Encounters:  03/10/23 185 lb (83.9 kg)  03/09/23 187 lb 3.2 oz (84.9 kg)  01/13/23 182 lb 9.6 oz (82.8 kg)    Physical Exam Vitals reviewed.  Constitutional:      General: He is not in acute distress.    Appearance: Normal appearance. He is obese. He is not ill-appearing, toxic-appearing or diaphoretic.  HENT:     Head: Normocephalic.     Right Ear: Tympanic membrane normal.     Left Ear: Tympanic membrane normal.     Nose: Nose normal.     Left Turbinates: Enlarged and pale.     Mouth/Throat:     Mouth: Mucous membranes are moist.  Eyes:     Pupils: Pupils are equal, round, and reactive to light.  Cardiovascular:     Rate and Rhythm: Normal rate and regular rhythm.  Pulmonary:     Effort: Pulmonary effort is normal.     Breath sounds: Normal breath sounds. No wheezing.  Musculoskeletal:        General: Normal range of motion.     Cervical back: Normal range of motion.  Neurological:     General: No focal deficit present.     Mental Status: He is alert and oriented to person, place, and time. Mental status is at baseline.  Psychiatric:        Mood and Affect: Mood normal.        Behavior: Behavior normal.        Thought Content: Thought content normal.        Judgment: Judgment normal.           Assessment & Plan:  ETD (Eustachian tube dysfunction), right  Chronic pansinusitis Assessment & Plan: Cont f/u with Ent.  Pt also requesting second opinion.  Continue with nasal saline and neti pot a few times weekly.    Orders: -     Ambulatory referral to ENT  Obstruction of sinonasal region Assessment & Plan: Ongoing f/u eval treat with ENT  Orders: -     Ambulatory referral to ENT  Hyponatremia Assessment & Plan: Table asymptomatic  Continue florinef  and continue f/u  with endo as scheduled.    Acquired hypothyroidism  Type 2 diabetes mellitus with other specified complication, unspecified whether long term insulin  use (HCC) Assessment & Plan: Stable at 7.1  Ordered hga1c today pending results.  Work on diabetic diet and exercise as tolerated. Yearly foot exam, and annual eye exam.  Maintain f/u with endo   Dyslipidemia Assessment & Plan: Stable.  Work on [employer] exercise as tolerated.       Return in about 6 months (around 09/07/2023) for f/u cholesterol.  Ginger Patrick, MSN, APRN, FNP-C [facility]

## 2023-03-10 NOTE — Assessment & Plan Note (Signed)
 Ongoing f/u eval treat with ENT

## 2023-03-11 DIAGNOSIS — R2681 Unsteadiness on feet: Secondary | ICD-10-CM | POA: Diagnosis not present

## 2023-03-11 DIAGNOSIS — M6281 Muscle weakness (generalized): Secondary | ICD-10-CM | POA: Diagnosis not present

## 2023-03-14 ENCOUNTER — Encounter (INDEPENDENT_AMBULATORY_CARE_PROVIDER_SITE_OTHER): Payer: Medicare Other | Admitting: Ophthalmology

## 2023-03-14 DIAGNOSIS — H35033 Hypertensive retinopathy, bilateral: Secondary | ICD-10-CM

## 2023-03-14 DIAGNOSIS — H33302 Unspecified retinal break, left eye: Secondary | ICD-10-CM | POA: Diagnosis not present

## 2023-03-14 DIAGNOSIS — E103293 Type 1 diabetes mellitus with mild nonproliferative diabetic retinopathy without macular edema, bilateral: Secondary | ICD-10-CM

## 2023-03-14 DIAGNOSIS — I10 Essential (primary) hypertension: Secondary | ICD-10-CM

## 2023-03-14 DIAGNOSIS — Z7984 Long term (current) use of oral hypoglycemic drugs: Secondary | ICD-10-CM | POA: Diagnosis not present

## 2023-03-14 NOTE — Progress Notes (Signed)
Office Visit Note  Patient: Kyle Simon             Date of Birth: [birth year]           MRN: [id number]             PCP: Mort Sawyers, FNP Referring: Mort Sawyers, FNP Visit Date: 03/24/2023 Occupation: @GUAROCC @  Subjective:  Generalized pain and weakness.  History of Present Illness: Kyle Simon is a 89 y.o. male with osteoarthritis and degenerative disc disease.  He returns today after his last visit on January 06, 2022.  Patient reports that he fell and September 2024 and had pubic rami fracture.  He was in the hospital and then in the rehab for about 3 weeks.  He has been going to physical therapy.  He states he is been having progressively increasing pain in his lower back.  He has difficulty with the prolonged standing.  He has been ambulating with the help of a walker.  Continues to have some discomfort in his knee joints.  He believes that his strength is not as good.    Activities of Daily Living:  Patient reports morning stiffness for all day.  Patient Reports nocturnal pain.  Difficulty dressing/grooming: Reports Difficulty climbing stairs: Reports Difficulty getting out of chair: Reports Difficulty using hands for taps, buttons, cutlery, and/or writing: Reports  Review of Systems  Constitutional:  Positive for fatigue.  HENT:  Negative for mouth sores and mouth dryness.   Eyes:  Negative for dryness.  Respiratory:  Negative for shortness of breath.   Cardiovascular:  Negative for chest pain and palpitations.  Gastrointestinal:  Positive for constipation and diarrhea. Negative for blood in stool.  Endocrine: Negative for increased urination.  Genitourinary:  Negative for involuntary urination.  Musculoskeletal:  Positive for joint pain, gait problem, joint pain, myalgias, muscle weakness, morning stiffness and myalgias. Negative for joint swelling and muscle tenderness.  Skin:  Negative for color change, rash and sensitivity to sunlight.   Allergic/Immunologic: Negative for susceptible to infections.  Neurological:  Positive for light-headedness. Negative for dizziness and headaches.  Hematological:  Negative for swollen glands.  Psychiatric/Behavioral:  Negative for depressed mood and sleep disturbance. The patient is not nervous/anxious.     PMFS History:  Patient Active Problem List   Diagnosis Date Noted   Obstruction of sinonasal region 03/10/2023   Chronic pansinusitis 03/10/2023   Multiple falls 12/28/2022   Reactive depression 12/28/2022   Hyponatremia 12/28/2022   Thoracic aorta atherosclerosis (HCC) 12/01/2022   Calcium blood decreased 12/01/2022   Cerebral infarction, chronic 12/01/2022   Muscle spasm 12/01/2022   Physical debility 12/01/2022   Weakness of both lower extremities 12/01/2022   Unstable gait 12/01/2022   History of fall 12/01/2022   Elevated fecal calprotectin 05/06/2022   Tinnitus of both ears 04/09/2022   Poor dentition 04/02/2022   Memory loss 11/26/2021   Balance problems 12/17/2020   Dyslipidemia 11/30/2020   Chronic synovitis 01/18/2020   Elevated coronary artery calcium score 11/25/2018   Aortic atherosclerosis (HCC) 10/05/2017   Chronic pain of left knee 08/22/2017   Lumbar radiculopathy 06/16/2017   DM assoc with CKD (chronic kidney disease), stage III (HCC) 04/14/2017   Arthralgia of right temporomandibular joint 03/02/2017   Chronic eczematous otitis externa of both ears 03/02/2017   Presbycusis of both ears 03/02/2017   ETD (Eustachian tube dysfunction), right 09/15/2016   Trochanteric bursitis of right hip 09/07/2016   Hypomagnesemia 12/29/2015   Type 2 diabetes  mellitus with other specified complication (HCC) 08/05/2015   Chronic rhinitis 11/01/2014   Hypertension associated with diabetes (HCC) 03/20/2014   Spondylolisthesis of lumbar region 10/23/2013   Diverticulosis of colon without hemorrhage 01/22/2013   Anemia, unspecified 08/13/2012   GERD (gastroesophageal  reflux disease) 03/26/2011   SENILE KERATOSIS 06/12/2008   Hyperlipidemia associated with type 2 diabetes mellitus (HCC) 04/19/2007   BPH (benign prostatic hyperplasia) 04/18/2007   Hypothyroidism 04/20/2006    Past Medical History:  Diagnosis Date   Arthritis    both hands;Dr.Jeanpierre Thebeau   Constipation due to pain medication    Diabetes mellitus without complication (HCC)    Type II. Uses no medication. Pt controls with diet and weight   Enlarged prostate    Frequent urination at night    GERD (gastroesophageal reflux disease)    History of noncompliance with medical treatment, presenting hazards to health 11/20/2018   Hyperlipidemia associated with type 2 diabetes mellitus (HCC) 04/19/2007   Qualifier: Diagnosis of  By: Alwyn Ren MD, William     Hypertension    Hypothyroidism    Polio    as a child w/o complications   Snoring    no sleep apnea   Statin declined-  11/20/2018   patient understands risks associated with this decision and has been advised to restart by cardiologist as well as myself several times   Torn meniscus    Transfusion history 2012   post TKR    Family History  Problem Relation Age of Onset   Coronary artery disease Mother    Emphysema Mother    Diverticulosis Mother    Thyroid disease Mother    Sudden death Father        accident   Asthma Brother    Bone cancer Brother    Diabetes Paternal Grandmother    Thyroid disease Daughter    Sarcoidosis Son    Anesthesia problems Neg Hx    Hypotension Neg Hx    Malignant hyperthermia Neg Hx    Pseudochol deficiency Neg Hx    Colon cancer Neg Hx    Past Surgical History:  Procedure Laterality Date   ANTERIOR LAT LUMBAR FUSION Right 11/03/2017   Procedure: Right Lumbar three-four Anterolateral lumbar interbody fusion with lateral plate;  Surgeon: Maeola Harman, MD;  Location: [facility] OR;  Service: Neurosurgery;  Laterality: Right;   BACK SURGERY  2011   hemiarthroplasty 01/11/1999 and 6   CARDIAC  CATHETERIZATION  09/2001   negative   COLONOSCOPY     COLONOSCOPY N/A 05/06/2022   Procedure: COLONOSCOPY;  Surgeon: Toney Reil, MD;  Location: [facility] ENDOSCOPY;  Service: Gastroenterology;  Laterality: N/A;   COLONOSCOPY WITH PROPOFOL N/A 05/05/2022   Procedure: COLONOSCOPY WITH PROPOFOL;  Surgeon: Toney Reil, MD;  Location: [facility] ENDOSCOPY;  Service: Gastroenterology;  Laterality: N/A;   EYE SURGERY  2003   R&L cataract removed & IOL- 2003 & 2007,Dr.Jenkins   INGUINAL HERNIA REPAIR  2002   right, Dr. Luan Pulling   JOINT REPLACEMENT  2012   left knee replacement   LIPOMA EXCISION     back   MENISCECTOMY Bilateral    Dr. Thurston Hole   PROSTATE SURGERY  07/2006   for urinary urgency, Dr. Tiana Loft CUFF REPAIR Right October, 2016   sinus & throat surgery-1995  1995   TONSILLECTOMY     TOTAL KNEE ARTHROPLASTY  01/11/2011   Procedure: TOTAL KNEE ARTHROPLASTY;  Surgeon: Raymon Mutton, MD;  Location: [facility] OR;  Service: Orthopedics;  Laterality: Left;   TOTAL KNEE ARTHROPLASTY Right 11/22/2016   Procedure: TOTAL KNEE ARTHROPLASTY;  Surgeon: Dannielle Huh, MD;  Location: [facility] OR;  Service: Orthopedics;  Laterality: Right;   UVULECTOMY  1996   Dr. 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      Family: 5 children (one passed away) 2 stepchildren, 14 grandchildren, 9 great-grandchildren - Museum/gallery conservator and granddaughter nearby otherwise across the country       Enjoys: fly and play golf, reading      Exercise: walking the dog   Diet: diabetic diet      Safety    Seat belts: Yes    Guns: Yes  and secure   Safe in relationships: Yes    Immunization History  Administered Date(s) Administered   Fluad Quad(high Dose 65+) 12/05/2019   Fluad Trivalent(High Dose 65+) 12/01/2022   Influenza Whole 01/09/2002   Influenza, High Dose Seasonal PF 11/24/2018, 02/08/2019   Influenza,inj,Quad PF,6+ Mos 11/29/2012, 11/29/2013   Influenza-Unspecified 11/30/2014, 11/22/2015, 01/12/2018, 11/27/2021   MMR 07/12/2017   PFIZER(Purple Top)SARS-COV-2 Vaccination 03/11/2019, 04/01/2019, 11/07/2019, 06/18/2020   Pfizer Covid-19 Vaccine Bivalent Booster 37yrs & up 12/31/2020   Pneumococcal Conjugate-13 03/20/2014   Pneumococcal Polysaccharide-23 03/30/1996, 02/13/2010   Respiratory Syncytial Virus Vaccine,Recomb Aduvanted(Arexvy) 12/15/2021   Td 03/30/1996, 04/26/2007   Td (Adult) 03/30/1996, 04/26/2007   Tdap 07/24/2018   Unspecified SARS-COV-2 Vaccination 11/27/2021   Zoster Recombinant(Shingrix) 07/24/2018, 11/24/2018, 02/08/2019     Objective: Vital Signs: BP (!) 143/76 (BP Location: Left Arm, Patient Position: Sitting, Cuff Size: Normal)   Pulse (!) 54   Ht 5\' 10"  (1.778 m)   Wt 189 lb 9.6 oz (86 kg)   BMI 27.20 kg/m    Physical Exam Vitals and nursing note reviewed.  Constitutional:      Appearance: He is well-developed.  HENT:     Head: Normocephalic and atraumatic.  Eyes:     Conjunctiva/sclera: Conjunctivae normal.     Pupils: Pupils are equal, round, and reactive to light.  Cardiovascular:     Rate and Rhythm: Normal rate and regular rhythm.     Heart sounds: Normal heart sounds.  Pulmonary:     Effort: Pulmonary effort is normal.     Breath sounds: Normal breath sounds.  Abdominal:     General: Bowel sounds are normal.     Palpations: Abdomen is soft.  Musculoskeletal:     Cervical back: Normal range of motion and neck supple.  Skin:    General: Skin is warm and dry.     Capillary Refill: Capillary refill takes less than 2 seconds.   Neurological:     Mental Status: He is alert and oriented to person, place, and time.  Psychiatric:        Behavior: Behavior normal.      Musculoskeletal Exam: Patient had limited lateral rotation of the cervical spine.  He has severe thoracic kyphosis.  Shoulder abduction was limited to 90 degrees bilaterally.  Elbow joints, wrist joints, MCPs PIPs and DIPs been good range  of motion.  PIP and DIP thickening.  Hip joints were in good range of motion.  Knee joints were replaced and had limited extension.  There was no tenderness over ankles or MTPs.  CDAI Exam: CDAI Score: -- Patient Global: --; Provider Global: -- Swollen: --; Tender: -- Joint Exam 03/24/2023   No joint exam has been documented for this visit   There is currently no information documented on the homunculus. Go to the Rheumatology activity and complete the homunculus joint exam.  Investigation: No additional findings.  Imaging: No results found.  Recent Labs: Lab Results  Component Value Date   WBC 7.2 12/27/2022   HGB 11.6 (L) 12/27/2022   PLT 305.0 12/27/2022   NA 132 (L) 03/09/2023   K 5.1 03/09/2023   CL 100 03/09/2023   CO2 24 03/09/2023   GLUCOSE 152 (H) 03/09/2023   BUN 28 (H) 03/09/2023   CREATININE 0.95 03/09/2023   BILITOT 0.7 11/05/2022   ALKPHOS 64 11/05/2022   AST 17 11/05/2022   ALT 14 11/05/2022   PROT 6.3 (L) 11/05/2022   ALBUMIN 3.5 11/05/2022   CALCIUM 8.7 03/09/2023   GFRAA 85 03/21/2019    DEXA scan March 18, 2022 the BMD measured at Femur Neck Left is 0.775 g/cm2 with a T-score of-2.3.Left Forearm Radius 33% 03/18/2022 87.0 Osteopenia -2.3 0.758 g/cm2   Speciality Comments: No specialty comments available.  Procedures:  No procedures performed Allergies: Simvastatin and Oxycodone   Assessment / Plan:     Visit Diagnoses: Chronic right shoulder pain -patient has limited range of motion.  He continues to have some discomfort.  H/O total knee replacement, bilateral -he  denies any increased pain in his knee joints.  He lives in chronic discomfort.  He had right total knee replacement 11/22/2016 by Dr. Valentina Gu, left total knee replacement 2012 by Dr. Valentina Gu.  Chronic pain of right ankle - Followed by Dr. Allena Katz.  Patient had injection by Dr. Allena Katz in the past.  All autoimmune workup had been negative in the past.  History of recent fall-patient states he fell in September and had pubic rami fracture.  He was in the hospital and then in rehab for about 3 weeks.  He is going to physical therapy.  Postural kyphosis of thoracic region-he has thoracic kyphosis.  No point tenderness was noted.  Stretching exercises were demonstrated in the office.  History of lumbar fusion - Due to DDD lumbar spine.  He has chronic discomfort.  Patient states recently has been having increased lower back pain.  He plans to see a spinal specialist in [place].  He had lumbar spine fusion by Dr. Venetia Maxon in the past.  Age-related osteoporosis without current pathological fracture - DEXA 03/18/22  the BMD measured at Femur Neck Left is 0.775 g/cm2 with a T-score of-2.3.Left Forearm Radius 33% 03/18/2022 87.0 Osteopenia -2.3 0.758 g/cm2.  Most recent DEXA scan showed bone density in the osteopenia range.  DEXA results were discussed with the patient and his wife.  Use of calcium rich diet and vitamin D was discussed.  I also discussed possible treatment in the future which included discussion regarding oral bisphosphonates, IV bisphosphonates, Prolia.  Patient needs some dental work and will have to complete that before initiating treatment.  Other medical problems are listed as follows:  History of diabetes mellitus  History of hypertension-blood pressure is elevated at 143/76.  He was advised to monitor blood pressure closely and follow-up with his PCP.  Aortic atherosclerosis (HCC)  History  of hyperlipidemia  History of gastroesophageal reflux (GERD)  Diverticulosis of colon without  hemorrhage  History of anemia  Orders: No orders of the defined types were placed in this encounter.  No orders of the defined types were placed in this encounter.    Follow-Up Instructions: Return in about 6 months (around 09/21/2023) for Osteoarthritis, Osteoporosis.   Pollyann Savoy, MD  Note - This record has been created using Animal nutritionist.  Chart creation errors have been sought, but may not always  have been located. Such creation errors do not reflect on  the standard of medical care.

## 2023-03-16 DIAGNOSIS — R2681 Unsteadiness on feet: Secondary | ICD-10-CM | POA: Diagnosis not present

## 2023-03-16 DIAGNOSIS — M6281 Muscle weakness (generalized): Secondary | ICD-10-CM | POA: Diagnosis not present

## 2023-03-18 DIAGNOSIS — M6281 Muscle weakness (generalized): Secondary | ICD-10-CM | POA: Diagnosis not present

## 2023-03-18 DIAGNOSIS — R2681 Unsteadiness on feet: Secondary | ICD-10-CM | POA: Diagnosis not present

## 2023-03-21 ENCOUNTER — Telehealth: Payer: Self-pay

## 2023-03-21 MED ORDER — PANCRELIPASE (LIP-PROT-AMYL) 36000-114000 UNITS PO CPEP: ORAL_CAPSULE | ORAL | 1 refills | Status: DC

## 2023-03-21 NOTE — Telephone Encounter (Signed)
Patient left a message on my voicemail on 03/18/2023 stating he has been having some change in his bowel habits in the last month. He states Dr. Allegra Lai did colonoscopy on him last year and would like a call back. Return his call and left a message for call back. He was seen on 04/07/2022 for chronic diarrhea and had colonoscopy on 05/06/2022. On 05/10/2022 was recommended to start taking creon 1 capsule with each meal and 1 with snack.

## 2023-03-21 NOTE — Addendum Note (Signed)
Addended by: Radene Knee L on: 03/21/2023 02:45 PM   Modules accepted: Orders

## 2023-03-21 NOTE — Telephone Encounter (Signed)
Patient verbalized understanding and will increase the medication but will need a New rx sent to the pharmacy. Sent medication to the pharmacy

## 2023-03-21 NOTE — Telephone Encounter (Signed)
The patient left a voicemail stating he was return [place] call to schedule appointment. I called him back to insure him that we received his message, and I sent the message to the nurse because he said that he has a few questions to ask the nurse.

## 2023-03-21 NOTE — Telephone Encounter (Signed)
States in the last month he has been having off and on diarrhea. He states it happens twice a week and on the day it happens he will have diarrhea 2 to 3 times that day. He states he will get a random urge to go to the bathroom and will sometimes not be able to make it to the bathroom. He states the next day after it happens he is okay. He is still taking the creon 1 capsule with each meal and snack and wants to know if he should keep taking the medication, Denies any abdominal pain, nausea or blood in stool.

## 2023-03-21 NOTE — Telephone Encounter (Signed)
Recommend to increase Creon to 2 pills before each meal and 1 with snack  RV

## 2023-03-23 DIAGNOSIS — R2681 Unsteadiness on feet: Secondary | ICD-10-CM | POA: Diagnosis not present

## 2023-03-23 DIAGNOSIS — M6281 Muscle weakness (generalized): Secondary | ICD-10-CM | POA: Diagnosis not present

## 2023-03-24 ENCOUNTER — Encounter: Payer: Self-pay | Admitting: Rheumatology

## 2023-03-24 ENCOUNTER — Ambulatory Visit: Payer: Medicare Other | Attending: Rheumatology | Admitting: Rheumatology

## 2023-03-24 VITALS — BP 143/76 | HR 54 | Ht 70.0 in | Wt 189.6 lb

## 2023-03-24 DIAGNOSIS — R600 Localized edema: Secondary | ICD-10-CM | POA: Diagnosis not present

## 2023-03-24 DIAGNOSIS — Z8639 Personal history of other endocrine, nutritional and metabolic disease: Secondary | ICD-10-CM

## 2023-03-24 DIAGNOSIS — I700 Atherosclerosis of aorta: Secondary | ICD-10-CM | POA: Diagnosis not present

## 2023-03-24 DIAGNOSIS — Z8719 Personal history of other diseases of the digestive system: Secondary | ICD-10-CM

## 2023-03-24 DIAGNOSIS — Z96653 Presence of artificial knee joint, bilateral: Secondary | ICD-10-CM

## 2023-03-24 DIAGNOSIS — M4004 Postural kyphosis, thoracic region: Secondary | ICD-10-CM | POA: Diagnosis not present

## 2023-03-24 DIAGNOSIS — G8929 Other chronic pain: Secondary | ICD-10-CM | POA: Diagnosis not present

## 2023-03-24 DIAGNOSIS — M810 Age-related osteoporosis without current pathological fracture: Secondary | ICD-10-CM

## 2023-03-24 DIAGNOSIS — Z862 Personal history of diseases of the blood and blood-forming organs and certain disorders involving the immune mechanism: Secondary | ICD-10-CM | POA: Diagnosis not present

## 2023-03-24 DIAGNOSIS — Z981 Arthrodesis status: Secondary | ICD-10-CM | POA: Diagnosis not present

## 2023-03-24 DIAGNOSIS — Z8679 Personal history of other diseases of the circulatory system: Secondary | ICD-10-CM | POA: Diagnosis not present

## 2023-03-24 DIAGNOSIS — Z9181 History of falling: Secondary | ICD-10-CM

## 2023-03-24 DIAGNOSIS — M25571 Pain in right ankle and joints of right foot: Secondary | ICD-10-CM | POA: Diagnosis not present

## 2023-03-24 DIAGNOSIS — M25511 Pain in right shoulder: Secondary | ICD-10-CM | POA: Diagnosis not present

## 2023-03-24 DIAGNOSIS — K5730 Diverticulosis of large intestine without perforation or abscess without bleeding: Secondary | ICD-10-CM

## 2023-03-25 DIAGNOSIS — M6281 Muscle weakness (generalized): Secondary | ICD-10-CM | POA: Diagnosis not present

## 2023-03-25 DIAGNOSIS — R2681 Unsteadiness on feet: Secondary | ICD-10-CM | POA: Diagnosis not present

## 2023-03-28 DIAGNOSIS — J328 Other chronic sinusitis: Secondary | ICD-10-CM | POA: Diagnosis not present

## 2023-03-28 DIAGNOSIS — J330 Polyp of nasal cavity: Secondary | ICD-10-CM | POA: Diagnosis not present

## 2023-03-29 ENCOUNTER — Encounter: Payer: Self-pay | Admitting: Podiatry

## 2023-03-29 ENCOUNTER — Ambulatory Visit (INDEPENDENT_AMBULATORY_CARE_PROVIDER_SITE_OTHER): Payer: Medicare Other | Admitting: Podiatry

## 2023-03-29 VITALS — Ht 70.0 in | Wt 189.6 lb

## 2023-03-29 DIAGNOSIS — M7752 Other enthesopathy of left foot: Secondary | ICD-10-CM

## 2023-03-29 DIAGNOSIS — M19072 Primary osteoarthritis, left ankle and foot: Secondary | ICD-10-CM

## 2023-03-29 NOTE — Progress Notes (Signed)
Subjective:  Patient ID: Kyle Simon, male    DOB: [birth year],  MRN: [id number]  Chief Complaint  Patient presents with   Toe Pain    Pt is here for bilateral toe pain.    89 y.o. male presents with the above complaint.  Patient presents with left hallux IPJ capsulitis painful to touch is progressive gotten worse worse with ambulation worse with pressure he wanted to get it evaluated he has not seen anyone else prior to seeing me.  There are some crepitus and pain with range of motion of the joint.  Pain scale 7 out of 10 dull aching nature   Review of Systems: Negative except as noted in the HPI. Denies N/V/F/Ch.  Past Medical History:  Diagnosis Date   Arthritis    both hands;Dr.Deveshwar   Constipation due to pain medication    Diabetes mellitus without complication (HCC)    Type II. Uses no medication. Pt controls with diet and weight   Enlarged prostate    Frequent urination at night    GERD (gastroesophageal reflux disease)    History of noncompliance with medical treatment, presenting hazards to health 11/20/2018   Hyperlipidemia associated with type 2 diabetes mellitus (HCC) 04/19/2007   Qualifier: Diagnosis of  By: Alwyn Ren MD, Chrissie Noa     Hypertension    Hypothyroidism    Polio    as a child w/o complications   Snoring    no sleep apnea   Statin declined-  11/20/2018   patient understands risks associated with this decision and has been advised to restart by cardiologist as well as myself several times   Torn meniscus    Transfusion history 2012   post TKR    Current Outpatient Medications:    celecoxib (CELEBREX) 100 MG capsule, Take 100 mg by mouth 2 (two) times daily., Disp: , Rfl:    CLOBETASOL PROPIONATE E 0.05 % emollient cream, as needed., Disp: , Rfl:    diclofenac Sodium (VOLTAREN) 1 % GEL, Apply 2 g topically at bedtime. (Patient taking differently: Apply 2 g topically as needed.), Disp: 100 g, Rfl: 2   fluticasone (FLONASE) 50 MCG/ACT nasal spray,  Place 2 sprays into both nostrils as needed., Disp: , Rfl:    Hypromellose (ARTIFICIAL TEARS OP), Place 1 drop into both eyes 2 (two) times daily., Disp: , Rfl:    levothyroxine (SYNTHROID) 137 MCG tablet, Take 1 tablet (137 mcg total) by mouth daily before breakfast., Disp: 90 tablet, Rfl: 0   lipase/protease/amylase (CREON) 36000 UNITS CPEP capsule, Take 2 capsules with the first bite of each meal and 1 capsule with the first bite of each snack, Disp: 720 capsule, Rfl: 1   Multiple Vitamin (MULTIVITAMIN PO), Take by mouth daily., Disp: , Rfl:    Omega-3 Fatty Acids (FISH OIL) 1000 MG CAPS, Take 1,000 mg by mouth every evening., Disp: , Rfl:    rosuvastatin (CRESTOR) 5 MG tablet, Take 1 tablet (5 mg total) by mouth at bedtime., Disp: 7 tablet, Rfl: 0   tamsulosin (FLOMAX) 0.4 MG CAPS capsule, TAKE 1 CAPSULE TWICE A DAY, Disp: 180 capsule, Rfl: 1   valsartan (DIOVAN) 320 MG tablet, Take 1 tablet (320 mg total) by mouth daily., Disp: 7 tablet, Rfl: 0  Social History   Tobacco Use  Smoking Status Former   Current packs/day: 0.00   Average packs/day: 1 pack/day for 18.0 years (18.0 ttl pk-yrs)   Types: Cigarettes   Start date: 03/01/1948   Quit date: 03/01/1966  Years since quitting: 57.1   Passive exposure: Never  Smokeless Tobacco Never    Allergies  Allergen Reactions   Simvastatin Other (See Comments)    MYALGIAS   Oxycodone Other (See Comments)    Severe constipation   Objective:  There were no vitals filed for this visit. Body mass index is 27.2 kg/m. Constitutional Well developed. Well nourished.  Vascular Dorsalis pedis pulses palpable bilaterally. Posterior tibial pulses palpable bilaterally. Capillary refill normal to all digits.  No cyanosis or clubbing noted. Pedal hair growth normal.  Neurologic Normal speech. Oriented to person, place, and time. Epicritic sensation to light touch grossly present bilaterally.  Dermatologic Nails well groomed and normal in  appearance. No open wounds. No skin lesions.  Orthopedic: Pain on palpation left hallux IPJ pain with range of motion of the joint.  Some crepitus clinically appreciated.  No pain at the metatarsophalangeal joint no pain with intra-articular first MPJ pain   Radiographs: None Assessment:   1. Capsulitis of toe, left   2. Arthritis of joint of lesser toe, left    Plan:  Patient was evaluated and treated and all questions answered.  Left hallux IPJ capsulitis with underlying arthritis -All questions and concerns were discussed with the patient in extensive detail given the amount of pain that is having a benefit from steroid injection of decreasing inflammatory component surgical pain.  Patient agrees with plan like to proceed with steroid injection -A steroid injection was performed at left hallux interphalangeal joint using 1% plain Lidocaine and 10 mg of Kenalog. This was well tolerated. -I discussed shoe gear modification to help with arthritis as well as Voltaren gel.  He states understanding  No follow-ups on file.

## 2023-03-30 DIAGNOSIS — R2681 Unsteadiness on feet: Secondary | ICD-10-CM | POA: Diagnosis not present

## 2023-03-30 DIAGNOSIS — M6281 Muscle weakness (generalized): Secondary | ICD-10-CM | POA: Diagnosis not present

## 2023-03-31 ENCOUNTER — Encounter: Payer: Self-pay | Admitting: *Deleted

## 2023-04-01 DIAGNOSIS — M6281 Muscle weakness (generalized): Secondary | ICD-10-CM | POA: Diagnosis not present

## 2023-04-01 DIAGNOSIS — R2681 Unsteadiness on feet: Secondary | ICD-10-CM | POA: Diagnosis not present

## 2023-04-05 DIAGNOSIS — R2681 Unsteadiness on feet: Secondary | ICD-10-CM | POA: Diagnosis not present

## 2023-04-05 DIAGNOSIS — M6281 Muscle weakness (generalized): Secondary | ICD-10-CM | POA: Diagnosis not present

## 2023-04-08 DIAGNOSIS — M6281 Muscle weakness (generalized): Secondary | ICD-10-CM | POA: Diagnosis not present

## 2023-04-08 DIAGNOSIS — R2681 Unsteadiness on feet: Secondary | ICD-10-CM | POA: Diagnosis not present

## 2023-04-12 DIAGNOSIS — R2681 Unsteadiness on feet: Secondary | ICD-10-CM | POA: Diagnosis not present

## 2023-04-12 DIAGNOSIS — M6281 Muscle weakness (generalized): Secondary | ICD-10-CM | POA: Diagnosis not present

## 2023-04-14 ENCOUNTER — Telehealth: Payer: Self-pay

## 2023-04-14 NOTE — Telephone Encounter (Signed)
Patient increased the Creon to taking 2 capsules with the first bite of each meal. Patient is having watery diarrhea that he is not able to control. States every time he sits down for a hour or more he will have diarrhea in his pants as soon he stands up. He states his symptoms are getting worse since starting the medication. Patient wants to know what you recommend

## 2023-04-15 DIAGNOSIS — M6281 Muscle weakness (generalized): Secondary | ICD-10-CM | POA: Diagnosis not present

## 2023-04-15 DIAGNOSIS — R2681 Unsteadiness on feet: Secondary | ICD-10-CM | POA: Diagnosis not present

## 2023-04-15 NOTE — Telephone Encounter (Signed)
Please check with him if he is taking MiraLAX, if yes, he should stop.  Go ahead and stop Creon.  Recommend to take psyllium husk daily and Imodium after first bowel movement for the day and if he has another bowel movement for that day, take another Imodium 2 mg.  This is over-the-counter  RV

## 2023-04-18 ENCOUNTER — Encounter: Payer: Self-pay | Admitting: Podiatry

## 2023-04-18 ENCOUNTER — Ambulatory Visit: Payer: Medicare Other | Admitting: Podiatry

## 2023-04-18 VITALS — Ht 70.0 in | Wt 189.6 lb

## 2023-04-18 DIAGNOSIS — M79674 Pain in right toe(s): Secondary | ICD-10-CM

## 2023-04-18 DIAGNOSIS — M79675 Pain in left toe(s): Secondary | ICD-10-CM

## 2023-04-18 DIAGNOSIS — B351 Tinea unguium: Secondary | ICD-10-CM | POA: Diagnosis not present

## 2023-04-18 DIAGNOSIS — E1142 Type 2 diabetes mellitus with diabetic polyneuropathy: Secondary | ICD-10-CM | POA: Diagnosis not present

## 2023-04-18 NOTE — Progress Notes (Signed)
  Subjective:  Patient ID: NILO FALLIN, male    DOB: 02-12-1933,  MRN: [id number]  89 y.o. male presents at risk foot care with history of diabetic neuropathy and painful mycotic toenails x 10 which interfere with daily activities. Pain is relieved with periodic professional debridement.  Chief Complaint  Patient presents with   Nail Problem    Pt is here for [facility] last A1C was 6.9 PCP is Dr Alfonse Alpers and LOV was in January.   New problem(s): None   PCP is Mort Sawyers, FNP.  Allergies  Allergen Reactions   Simvastatin Other (See Comments)    MYALGIAS   Oxycodone Other (See Comments)    Severe constipation    Review of Systems: Negative except as noted in the HPI.   Objective:  SIDDHANT HASHEMI is a pleasant 89 y.o. male WD, WN in NAD. AAO x 3.  Vascular Examination: Vascular status intact b/l with palpable pedal pulses. CFT immediate b/l. Pedal hair present. No edema. No pain with calf compression b/l. Skin temperature gradient WNL b/l. No varicosities noted. No cyanosis or clubbing noted.  Neurological Examination: Sensation grossly intact b/l with 10 gram monofilament. Vibratory sensation intact b/l.  Dermatological Examination: Pedal skin with normal turgor, texture and tone b/l. No open wounds nor interdigital macerations noted. Toenails 1-5 b/l thick, discolored, elongated with subungual debris and pain on dorsal palpation. No hyperkeratotic lesions noted b/l.   There is noted onchyolysis of entire nailplate of L 3rd toe.  The nailbed remains intact. There is no erythema, no edema, no drainage, no underlying fluctuance.  Musculoskeletal Examination: Muscle strength 5/5 to b/l LE.  No pain, crepitus noted b/l. No gross pedal deformities. Patient ambulates independently without assistive aids.   Radiographs: None Last A1c:      Latest Ref Rng & Units 03/09/2023    2:44 PM 09/30/2022   10:41 AM  Hemoglobin A1C  Hemoglobin-A1c <5.7 % of total Hgb 7.1  6.4    Assessment:    1. Pain due to onychomycosis of toenails of both feet   2. Diabetic peripheral neuropathy associated with type 2 diabetes mellitus (HCC)    Plan:  Patient was evaluated and treated. All patient's and/or POA's questions/concerns addressed on today's visit. Toenails 1-5 right foot, left great toe, L 2nd toe, L 4th toe, and L 5th toe debrided in length and girth without incident. Continue foot and shoe inspections daily. Monitor blood glucose per PCP/Endocrinologist's recommendations. Continue soft, supportive shoe gear daily. Report any pedal injuries to medical professional. Call office if there are any questions/concerns. -Loose nailplate L 3rd toe gently debrided en toto. Digital nailbed cleansed with alcohol. No further treatment required by patient. -Patient/POA to call should there be question/concern in the interim.  Return in about 3 months (around 07/16/2023).  Freddie Breech, DPM       LOCATION: [address] [place] [address]                   Office [phone]   [facility] LOCATION: [address] [place], [place] [address] Office [phone]

## 2023-04-19 DIAGNOSIS — R2681 Unsteadiness on feet: Secondary | ICD-10-CM | POA: Diagnosis not present

## 2023-04-19 DIAGNOSIS — M6281 Muscle weakness (generalized): Secondary | ICD-10-CM | POA: Diagnosis not present

## 2023-04-19 NOTE — Telephone Encounter (Signed)
Patient is not taking Miralax and will stop the creon. He verbalized understanding of results

## 2023-04-20 DIAGNOSIS — J339 Nasal polyp, unspecified: Secondary | ICD-10-CM | POA: Diagnosis not present

## 2023-04-20 DIAGNOSIS — J329 Chronic sinusitis, unspecified: Secondary | ICD-10-CM | POA: Diagnosis not present

## 2023-04-22 DIAGNOSIS — M6281 Muscle weakness (generalized): Secondary | ICD-10-CM | POA: Diagnosis not present

## 2023-04-22 DIAGNOSIS — R2681 Unsteadiness on feet: Secondary | ICD-10-CM | POA: Diagnosis not present

## 2023-04-29 DIAGNOSIS — M6281 Muscle weakness (generalized): Secondary | ICD-10-CM | POA: Diagnosis not present

## 2023-04-29 DIAGNOSIS — R2681 Unsteadiness on feet: Secondary | ICD-10-CM | POA: Diagnosis not present

## 2023-05-09 DIAGNOSIS — J339 Nasal polyp, unspecified: Secondary | ICD-10-CM | POA: Diagnosis not present

## 2023-05-09 DIAGNOSIS — J3489 Other specified disorders of nose and nasal sinuses: Secondary | ICD-10-CM | POA: Diagnosis not present

## 2023-05-09 DIAGNOSIS — J320 Chronic maxillary sinusitis: Secondary | ICD-10-CM | POA: Diagnosis not present

## 2023-05-10 DIAGNOSIS — M25512 Pain in left shoulder: Secondary | ICD-10-CM | POA: Diagnosis not present

## 2023-05-10 DIAGNOSIS — Z96652 Presence of left artificial knee joint: Secondary | ICD-10-CM | POA: Diagnosis not present

## 2023-05-10 DIAGNOSIS — M25511 Pain in right shoulder: Secondary | ICD-10-CM | POA: Diagnosis not present

## 2023-05-11 ENCOUNTER — Other Ambulatory Visit: Payer: Self-pay | Admitting: Family

## 2023-05-11 DIAGNOSIS — E039 Hypothyroidism, unspecified: Secondary | ICD-10-CM

## 2023-05-12 NOTE — Progress Notes (Unsigned)
  Cardiology Office Note:   Date:  05/13/2023  ID:  Kyle Simon, DOB 1933-01-19, MRN [id number] PCP: Mort Sawyers, FNP  [facility] Providers Cardiologist:  Rollene Rotunda, MD {  History of Present Illness:   Kyle Simon is a 89 y.o. male who presents for follow up with SOB and abnormal EKG.   He had a low risk stress echo in 2012.  He had a distant cardiac cath.   He had dyspnea at a previous visit and I sent him for a [employer].  This was negative for ischemia.     Since I last saw him he denies any new symptoms.  He did have a fall and broke his pelvis.  He had to go to the hospital and rehab.  Back  The patient denies any new symptoms such as chest discomfort, neck or arm discomfort. There has been no new shortness of breath, PND or orthopnea. There have been no reported palpitations, presyncope or syncope.   ROS: As stated in the HPI and negative for all other systems.  Studies Reviewed:    EKG:   EKG Interpretation Date/Time:  Friday May 13 2023 11:30:34 EDT Ventricular Rate:  47 PR Interval:  150 QRS Duration:  84 QT Interval:  422 QTC Calculation: 373 R Axis:   88  Text Interpretation: Sinus bradycardia When compared with ECG of 25-Aug-2022 09:38, No significant change since last tracing Confirmed by Rollene Rotunda ([id number]) on 05/13/2023 11:47:05 AM    Risk Assessment/Calculations:          Physical Exam:   VS:  BP (!) 152/66   Pulse (!) 47   Ht 5\' 10"  (1.778 m)   Wt 189 lb 6.4 oz (85.9 kg)   SpO2 99%   BMI 27.18 kg/m    Wt Readings from Last 3 Encounters:  05/13/23 189 lb 6.4 oz (85.9 kg)  04/18/23 189 lb 9.6 oz (86 kg)  03/29/23 189 lb 9.6 oz (86 kg)     GEN: Well nourished, well developed in no acute distress NECK: No JVD; No carotid bruits CARDIAC: RRR, 2 out of 6 brief apical systolic murmur radiating at the aortic outflow tract, no diastolic murmurs, rubs, gallops RESPIRATORY:  Clear to auscultation without rales, wheezing or  rhonchi  ABDOMEN: Soft, non-tender, non-distended EXTREMITIES:  No edema; No deformity   ASSESSMENT AND PLAN:   SOB:    The patient has no new symptoms.  No change in therapy.   HTN:  The blood pressure is at target.  No change in therapy.   AORTIC ATHEROSCLEROSIS: With primary risk reduction.   DYSLIPIDEMIA:    His LDL was 29 with HDL 49.  No change in therapy.  MURMUR: I suspect aortic sclerosis or mild stenosis.  No change in therapy.  We talked about this and I do not think imaging is indicated as he has no symptoms.    Follow up with me in one year.   Signed, Rollene Rotunda, MD

## 2023-05-13 ENCOUNTER — Ambulatory Visit: Payer: Medicare Other | Attending: Cardiology | Admitting: Cardiology

## 2023-05-13 ENCOUNTER — Encounter: Payer: Self-pay | Admitting: Cardiology

## 2023-05-13 VITALS — BP 152/66 | HR 47 | Ht 70.0 in | Wt 189.4 lb

## 2023-05-13 DIAGNOSIS — E785 Hyperlipidemia, unspecified: Secondary | ICD-10-CM | POA: Insufficient documentation

## 2023-05-13 DIAGNOSIS — I10 Essential (primary) hypertension: Secondary | ICD-10-CM | POA: Diagnosis present

## 2023-05-13 DIAGNOSIS — R0602 Shortness of breath: Secondary | ICD-10-CM | POA: Insufficient documentation

## 2023-05-13 NOTE — Patient Instructions (Signed)

## 2023-05-17 DIAGNOSIS — R2681 Unsteadiness on feet: Secondary | ICD-10-CM | POA: Diagnosis not present

## 2023-05-17 DIAGNOSIS — M6281 Muscle weakness (generalized): Secondary | ICD-10-CM | POA: Diagnosis not present

## 2023-05-24 DIAGNOSIS — R2681 Unsteadiness on feet: Secondary | ICD-10-CM | POA: Diagnosis not present

## 2023-05-24 DIAGNOSIS — M6281 Muscle weakness (generalized): Secondary | ICD-10-CM | POA: Diagnosis not present

## 2023-05-25 DIAGNOSIS — D2261 Melanocytic nevi of right upper limb, including shoulder: Secondary | ICD-10-CM | POA: Diagnosis not present

## 2023-05-25 DIAGNOSIS — L821 Other seborrheic keratosis: Secondary | ICD-10-CM | POA: Diagnosis not present

## 2023-05-25 DIAGNOSIS — D225 Melanocytic nevi of trunk: Secondary | ICD-10-CM | POA: Diagnosis not present

## 2023-05-25 DIAGNOSIS — R202 Paresthesia of skin: Secondary | ICD-10-CM | POA: Diagnosis not present

## 2023-05-25 DIAGNOSIS — D2271 Melanocytic nevi of right lower limb, including hip: Secondary | ICD-10-CM | POA: Diagnosis not present

## 2023-05-25 DIAGNOSIS — D2272 Melanocytic nevi of left lower limb, including hip: Secondary | ICD-10-CM | POA: Diagnosis not present

## 2023-05-25 DIAGNOSIS — L565 Disseminated superficial actinic porokeratosis (DSAP): Secondary | ICD-10-CM | POA: Diagnosis not present

## 2023-05-25 DIAGNOSIS — D2262 Melanocytic nevi of left upper limb, including shoulder: Secondary | ICD-10-CM | POA: Diagnosis not present

## 2023-05-27 DIAGNOSIS — M6281 Muscle weakness (generalized): Secondary | ICD-10-CM | POA: Diagnosis not present

## 2023-05-27 DIAGNOSIS — R2681 Unsteadiness on feet: Secondary | ICD-10-CM | POA: Diagnosis not present

## 2023-05-30 DIAGNOSIS — M6281 Muscle weakness (generalized): Secondary | ICD-10-CM | POA: Diagnosis not present

## 2023-05-30 DIAGNOSIS — R2681 Unsteadiness on feet: Secondary | ICD-10-CM | POA: Diagnosis not present

## 2023-05-31 ENCOUNTER — Ambulatory Visit (INDEPENDENT_AMBULATORY_CARE_PROVIDER_SITE_OTHER): Admitting: Podiatry

## 2023-05-31 DIAGNOSIS — M7752 Other enthesopathy of left foot: Secondary | ICD-10-CM | POA: Diagnosis not present

## 2023-05-31 DIAGNOSIS — M792 Neuralgia and neuritis, unspecified: Secondary | ICD-10-CM | POA: Diagnosis not present

## 2023-05-31 MED ORDER — GABAPENTIN 300 MG PO CAPS: 300.0000 mg | ORAL_CAPSULE | Freq: Three times a day (TID) | ORAL | 3 refills | Status: DC

## 2023-05-31 NOTE — Progress Notes (Signed)
 Subjective:  Patient ID: Kyle Simon, male    DOB: 29-Jun-1932,  MRN: [id number]  Chief Complaint  Patient presents with   Toe Pain    89 y.o. male presents with the above complaint.  Patient presents with left hallux IPJ capsulitis painful to touch is progressive gotten worse worse with ambulation worse with pressure he wanted to get it evaluated he has not seen anyone else prior to seeing me.  There are some crepitus and pain with range of motion of the joint.  Pain scale 7 out of 10 dull aching nature   Review of Systems: Negative except as noted in the HPI. Denies N/V/F/Ch.  Past Medical History:  Diagnosis Date   Arthritis    both hands;Dr.Deveshwar   Constipation due to pain medication    Diabetes mellitus without complication (HCC)    Type II. Uses no medication. Pt controls with diet and weight   Enlarged prostate    Frequent urination at night    GERD (gastroesophageal reflux disease)    History of noncompliance with medical treatment, presenting hazards to health 11/20/2018   Hyperlipidemia associated with type 2 diabetes mellitus (HCC) 04/19/2007   Qualifier: Diagnosis of  By: Alwyn Ren MD, Chrissie Noa     Hypertension    Hypothyroidism    Polio    as a child w/o complications   Snoring    no sleep apnea   Statin declined-  11/20/2018   patient understands risks associated with this decision and has been advised to restart by cardiologist as well as myself several times   Torn meniscus    Transfusion history 2012   post TKR    Current Outpatient Medications:    gabapentin (NEURONTIN) 300 MG capsule, Take 1 capsule (300 mg total) by mouth 3 (three) times daily., Disp: 90 capsule, Rfl: 3   celecoxib (CELEBREX) 100 MG capsule, Take 100 mg by mouth 2 (two) times daily., Disp: , Rfl:    CLOBETASOL PROPIONATE E 0.05 % emollient cream, as needed., Disp: , Rfl:    diclofenac Sodium (VOLTAREN) 1 % GEL, Apply 2 g topically at bedtime. (Patient taking differently: Apply 2 g  topically as needed.), Disp: 100 g, Rfl: 2   fluticasone (FLONASE) 50 MCG/ACT nasal spray, Place 2 sprays into both nostrils as needed., Disp: , Rfl:    Hypromellose (ARTIFICIAL TEARS OP), Place 1 drop into both eyes 2 (two) times daily., Disp: , Rfl:    lipase/protease/amylase (CREON) 36000 UNITS CPEP capsule, Take 2 capsules with the first bite of each meal and 1 capsule with the first bite of each snack, Disp: 720 capsule, Rfl: 1   Multiple Vitamin (MULTIVITAMIN PO), Take by mouth daily., Disp: , Rfl:    Omega-3 Fatty Acids (FISH OIL) 1000 MG CAPS, Take 1,000 mg by mouth every evening., Disp: , Rfl:    rosuvastatin (CRESTOR) 5 MG tablet, Take 1 tablet (5 mg total) by mouth at bedtime., Disp: 7 tablet, Rfl: 0   SYNTHROID 137 MCG tablet, TAKE 1 TABLET DAILY BEFORE BREAKFAST, Disp: 90 tablet, Rfl: 3   tamsulosin (FLOMAX) 0.4 MG CAPS capsule, TAKE 1 CAPSULE TWICE A DAY, Disp: 180 capsule, Rfl: 1   valsartan (DIOVAN) 320 MG tablet, Take 1 tablet (320 mg total) by mouth daily., Disp: 7 tablet, Rfl: 0  Social History   Tobacco Use  Smoking Status Former   Current packs/day: 0.00   Average packs/day: 1 pack/day for 18.0 years (18.0 ttl pk-yrs)   Types: Cigarettes   Start  date: 03/01/1948   Quit date: 03/01/1966   Years since quitting: 57.2   Passive exposure: Never  Smokeless Tobacco Never    Allergies  Allergen Reactions   Simvastatin Other (See Comments)    MYALGIAS   Oxycodone Other (See Comments)    Severe constipation   Objective:  There were no vitals filed for this visit. There is no height or weight on file to calculate BMI. Constitutional Well developed. Well nourished.  Vascular Dorsalis pedis pulses palpable bilaterally. Posterior tibial pulses palpable bilaterally. Capillary refill normal to all digits.  No cyanosis or clubbing noted. Pedal hair growth normal.  Neurologic Normal speech. Oriented to person, place, and time. Epicritic sensation to light touch grossly  present bilaterally.  Subjective complaint neuropathic pain noted protective sensations intact  Dermatologic Nails well groomed and normal in appearance. No open wounds. No skin lesions.  Orthopedic: Pain on palpation left hallux IPJ pain with range of motion of the joint.  Some crepitus clinically appreciated.  No pain at the metatarsophalangeal joint no pain with intra-articular first MPJ pain   Radiographs: None Assessment:   1. Capsulitis of toe, left   2. Neuropathic pain     Plan:  Patient was evaluated and treated and all questions answered.  Left hallux IPJ capsulitis with underlying arthritis -All questions and concerns were discussed with the patient in extensive detail given the amount of pain that is having a benefit from steroid injection of decreasing inflammatory component surgical pain.  Patient agrees with plan like to proceed with steroid injection -A second steroid injection was performed at left hallux interphalangeal joint using 1% plain Lidocaine and 10 mg of Kenalog. This was well tolerated. -I discussed shoe gear modification to help with arthritis as well as Voltaren gel.  He states understanding  Left neuropathic pain -Gabapentin was prescribed and I discussed with the patient extensive detail the cause of the neuropathy.  This is likely coming from the lower back as he has some lower back issues.  Given that this sporadically happens mostly at nighttime will benefit from gabapentin to help with the pain.  Gabapentin was sent to the pharmacy.  No follow-ups on file.

## 2023-06-02 DIAGNOSIS — R2681 Unsteadiness on feet: Secondary | ICD-10-CM | POA: Diagnosis not present

## 2023-06-02 DIAGNOSIS — M6281 Muscle weakness (generalized): Secondary | ICD-10-CM | POA: Diagnosis not present

## 2023-06-06 ENCOUNTER — Telehealth: Payer: Self-pay | Admitting: Podiatry

## 2023-06-06 ENCOUNTER — Encounter: Payer: Self-pay | Admitting: Podiatry

## 2023-06-06 NOTE — Telephone Encounter (Signed)
 Kyle Simon has been working 1st toe L now foot swelling with discharge from the toe  Please use pharmacy if needed: [id number] [name], [place] - [address] Phone: [phone]  Fax: [phone]      They are going to try and upload a picture to MyChart.

## 2023-06-08 ENCOUNTER — Other Ambulatory Visit: Payer: Self-pay

## 2023-06-08 DIAGNOSIS — R2681 Unsteadiness on feet: Secondary | ICD-10-CM | POA: Diagnosis not present

## 2023-06-08 DIAGNOSIS — M6281 Muscle weakness (generalized): Secondary | ICD-10-CM | POA: Diagnosis not present

## 2023-06-08 MED ORDER — DOXYCYCLINE HYCLATE 100 MG PO TABS: 100.0000 mg | ORAL_TABLET | Freq: Two times a day (BID) | ORAL | 0 refills | Status: DC

## 2023-06-09 ENCOUNTER — Ambulatory Visit (INDEPENDENT_AMBULATORY_CARE_PROVIDER_SITE_OTHER): Admitting: Podiatry

## 2023-06-09 DIAGNOSIS — M792 Neuralgia and neuritis, unspecified: Secondary | ICD-10-CM | POA: Diagnosis not present

## 2023-06-09 DIAGNOSIS — L97521 Non-pressure chronic ulcer of other part of left foot limited to breakdown of skin: Secondary | ICD-10-CM

## 2023-06-09 DIAGNOSIS — E1142 Type 2 diabetes mellitus with diabetic polyneuropathy: Secondary | ICD-10-CM

## 2023-06-09 DIAGNOSIS — J324 Chronic pansinusitis: Secondary | ICD-10-CM | POA: Diagnosis not present

## 2023-06-09 DIAGNOSIS — L539 Erythematous condition, unspecified: Secondary | ICD-10-CM | POA: Diagnosis not present

## 2023-06-09 NOTE — Progress Notes (Signed)
 Subjective:  Patient ID: Kyle Simon, male    DOB: [birth year],  MRN: [id number]  Chief Complaint  Patient presents with   Capsulitis of toe, left    Capsulitis of toe, left    89 y.o. male presents with the above complaint.  Patient presents with left hallux IPJ capsulitis painful to touch is progressive gotten worse worse with ambulation worse with pressure he wanted to get it evaluated he has not seen anyone else prior to seeing me.  There are some crepitus and pain with range of motion of the joint.  Pain scale 7 out of 10 dull aching nature   Review of Systems: Negative except as noted in the HPI. Denies N/V/F/Ch.  Past Medical History:  Diagnosis Date   Arthritis    both hands;Dr.Deveshwar   Constipation due to pain medication    Diabetes mellitus without complication (HCC)    Type II. Uses no medication. Pt controls with diet and weight   Enlarged prostate    Frequent urination at night    GERD (gastroesophageal reflux disease)    History of noncompliance with medical treatment, presenting hazards to health 11/20/2018   Hyperlipidemia associated with type 2 diabetes mellitus (HCC) 04/19/2007   Qualifier: Diagnosis of  By: Alwyn Ren MD, Chrissie Noa     Hypertension    Hypothyroidism    Polio    as a child w/o complications   Snoring    no sleep apnea   Statin declined-  11/20/2018   patient understands risks associated with this decision and has been advised to restart by cardiologist as well as myself several times   Torn meniscus    Transfusion history 2012   post TKR    Current Outpatient Medications:    celecoxib (CELEBREX) 100 MG capsule, Take 100 mg by mouth 2 (two) times daily., Disp: , Rfl:    CLOBETASOL PROPIONATE E 0.05 % emollient cream, as needed., Disp: , Rfl:    diclofenac Sodium (VOLTAREN) 1 % GEL, Apply 2 g topically at bedtime. (Patient taking differently: Apply 2 g topically as needed.), Disp: 100 g, Rfl: 2   doxycycline (VIBRA-TABS) 100 MG tablet,  Take 1 tablet (100 mg total) by mouth 2 (two) times daily., Disp: 20 tablet, Rfl: 0   fluticasone (FLONASE) 50 MCG/ACT nasal spray, Place 2 sprays into both nostrils as needed., Disp: , Rfl:    gabapentin (NEURONTIN) 300 MG capsule, Take 1 capsule (300 mg total) by mouth 3 (three) times daily., Disp: 90 capsule, Rfl: 3   Hypromellose (ARTIFICIAL TEARS OP), Place 1 drop into both eyes 2 (two) times daily., Disp: , Rfl:    lipase/protease/amylase (CREON) 36000 UNITS CPEP capsule, Take 2 capsules with the first bite of each meal and 1 capsule with the first bite of each snack, Disp: 720 capsule, Rfl: 1   Multiple Vitamin (MULTIVITAMIN PO), Take by mouth daily., Disp: , Rfl:    Omega-3 Fatty Acids (FISH OIL) 1000 MG CAPS, Take 1,000 mg by mouth every evening., Disp: , Rfl:    rosuvastatin (CRESTOR) 5 MG tablet, Take 1 tablet (5 mg total) by mouth at bedtime., Disp: 7 tablet, Rfl: 0   SYNTHROID 137 MCG tablet, TAKE 1 TABLET DAILY BEFORE BREAKFAST, Disp: 90 tablet, Rfl: 3   tamsulosin (FLOMAX) 0.4 MG CAPS capsule, TAKE 1 CAPSULE TWICE A DAY, Disp: 180 capsule, Rfl: 1   valsartan (DIOVAN) 320 MG tablet, Take 1 tablet (320 mg total) by mouth daily., Disp: 7 tablet, Rfl: 0  Social  History   Tobacco Use  Smoking Status Former   Current packs/day: 0.00   Average packs/day: 1 pack/day for 18.0 years (18.0 ttl pk-yrs)   Types: Cigarettes   Start date: 03/01/1948   Quit date: 03/01/1966   Years since quitting: 57.3   Passive exposure: Never  Smokeless Tobacco Never    Allergies  Allergen Reactions   Simvastatin Other (See Comments)    MYALGIAS   Oxycodone Other (See Comments)    Severe constipation   Objective:  There were no vitals filed for this visit. There is no height or weight on file to calculate BMI. Constitutional Well developed. Well nourished.  Vascular Dorsalis pedis pulses faintly palpable bilaterally. Posterior tibial pulses faintly palpable bilaterally. Capillary refill sluggish  to all toes No cyanosis or clubbing noted. Pedal hair growth normal.  Neurologic Normal speech. Oriented to person, place, and time. Epicritic sensation to light touch grossly present bilaterally.  Subjective complaint neuropathic pain noted protective sensations intact  Dermatologic Nails well groomed and normal in appearance. No open wounds. No skin lesions.  Orthopedic: Pain on palpation left hallux IPJ pain with range of motion of the joint.  Some crepitus clinically appreciated.  No pain at the metatarsophalangeal joint no pain with intra-articular first MPJ pain   Radiographs: None Assessment:   1. Dependent rubor   2. Neuropathic pain   3. Diabetic peripheral neuropathy associated with type 2 diabetes mellitus (HCC)   4. Toe ulcer, left, limited to breakdown of skin [facility])      Plan:  Patient was evaluated and treated and all questions answered.  Dependent rubor/vascular abnormality - I explained to patient the etiology of the pain were various treatment options were discussed.  Given the breakdown of the skin patient will benefit from vascular workup.  ABIs PVRs were placed  Left dorsal hallux ulceration limited to the breakdown of skin - I encouraged him to do Betadine wet-to-dry dressing.  The ulcer breakdown started last week on 06/01/2023.  I discussed with the patient in extensive detail if he fails local wound care he will benefit from graft application we will determine that during next visit in 3 weeks   Bilateral neuropathic pain with a history of back surgery - Will transition patient to Lyrica as gabapentin has been helping some but is making him too drowsy. - I also discussed that patient will benefit from referral to back specialist he states understanding and would like to proceed  No follow-ups on file.

## 2023-06-10 DIAGNOSIS — M6281 Muscle weakness (generalized): Secondary | ICD-10-CM | POA: Diagnosis not present

## 2023-06-10 DIAGNOSIS — R2681 Unsteadiness on feet: Secondary | ICD-10-CM | POA: Diagnosis not present

## 2023-06-13 DIAGNOSIS — R2681 Unsteadiness on feet: Secondary | ICD-10-CM | POA: Diagnosis not present

## 2023-06-13 DIAGNOSIS — M6281 Muscle weakness (generalized): Secondary | ICD-10-CM | POA: Diagnosis not present

## 2023-06-16 DIAGNOSIS — R2681 Unsteadiness on feet: Secondary | ICD-10-CM | POA: Diagnosis not present

## 2023-06-16 DIAGNOSIS — M6281 Muscle weakness (generalized): Secondary | ICD-10-CM | POA: Diagnosis not present

## 2023-06-21 DIAGNOSIS — R2681 Unsteadiness on feet: Secondary | ICD-10-CM | POA: Diagnosis not present

## 2023-06-21 DIAGNOSIS — M6281 Muscle weakness (generalized): Secondary | ICD-10-CM | POA: Diagnosis not present

## 2023-06-23 ENCOUNTER — Ambulatory Visit (INDEPENDENT_AMBULATORY_CARE_PROVIDER_SITE_OTHER): Admitting: Podiatry

## 2023-06-23 ENCOUNTER — Other Ambulatory Visit: Payer: Self-pay | Admitting: Family

## 2023-06-23 DIAGNOSIS — L97521 Non-pressure chronic ulcer of other part of left foot limited to breakdown of skin: Secondary | ICD-10-CM | POA: Diagnosis not present

## 2023-06-23 DIAGNOSIS — M792 Neuralgia and neuritis, unspecified: Secondary | ICD-10-CM

## 2023-06-23 DIAGNOSIS — N400 Enlarged prostate without lower urinary tract symptoms: Secondary | ICD-10-CM

## 2023-06-23 DIAGNOSIS — L97511 Non-pressure chronic ulcer of other part of right foot limited to breakdown of skin: Secondary | ICD-10-CM

## 2023-06-23 DIAGNOSIS — M6281 Muscle weakness (generalized): Secondary | ICD-10-CM | POA: Diagnosis not present

## 2023-06-23 DIAGNOSIS — E1142 Type 2 diabetes mellitus with diabetic polyneuropathy: Secondary | ICD-10-CM

## 2023-06-23 DIAGNOSIS — R2681 Unsteadiness on feet: Secondary | ICD-10-CM | POA: Diagnosis not present

## 2023-06-23 MED ORDER — PREGABALIN 150 MG PO CAPS: 150.0000 mg | ORAL_CAPSULE | Freq: Two times a day (BID) | ORAL | 0 refills | Status: DC

## 2023-06-23 NOTE — Progress Notes (Signed)
 Subjective:  Patient ID: Kyle Simon, male    DOB: 04/04/1932,  MRN: [id number]  Chief Complaint  Patient presents with   Toe Pain    89 y.o. male presents with the above complaint.  Patient presents with left hallux IPJ capsulitis painful to touch is progressive gotten worse worse with ambulation worse with pressure he wanted to get it evaluated he has not seen anyone else prior to seeing me.  There are some crepitus and pain with range of motion of the joint.  Pain scale 7 out of 10 dull aching nature   Review of Systems: Negative except as noted in the HPI. Denies N/V/F/Ch.  Past Medical History:  Diagnosis Date   Arthritis    both hands;Dr.Deveshwar   Constipation due to pain medication    Diabetes mellitus without complication (HCC)    Type II. Uses no medication. Pt controls with diet and weight   Enlarged prostate    Frequent urination at night    GERD (gastroesophageal reflux disease)    History of noncompliance with medical treatment, presenting hazards to health 11/20/2018   Hyperlipidemia associated with type 2 diabetes mellitus (HCC) 04/19/2007   Qualifier: Diagnosis of  By: Donnice Gale MD, Sammie Crigler     Hypertension    Hypothyroidism    Polio    as a child w/o complications   Snoring    no sleep apnea   Statin declined-  11/20/2018   patient understands risks associated with this decision and has been advised to restart by cardiologist as well as myself several times   Torn meniscus    Transfusion history 2012   post TKR    Current Outpatient Medications:    pregabalin  (LYRICA ) 150 MG capsule, Take 1 capsule (150 mg total) by mouth 2 (two) times daily., Disp: 60 capsule, Rfl: 0   celecoxib  (CELEBREX ) 100 MG capsule, Take 100 mg by mouth 2 (two) times daily., Disp: , Rfl:    CLOBETASOL  PROPIONATE E 0.05 % emollient cream, as needed., Disp: , Rfl:    diclofenac  Sodium (VOLTAREN ) 1 % GEL, Apply 2 g topically at bedtime. (Patient taking differently: Apply 2 g  topically as needed.), Disp: 100 g, Rfl: 2   doxycycline  (VIBRA -TABS) 100 MG tablet, Take 1 tablet (100 mg total) by mouth 2 (two) times daily., Disp: 20 tablet, Rfl: 0   fluticasone  (FLONASE ) 50 MCG/ACT nasal spray, Place 2 sprays into both nostrils as needed., Disp: , Rfl:    gabapentin  (NEURONTIN ) 300 MG capsule, Take 1 capsule (300 mg total) by mouth 3 (three) times daily., Disp: 90 capsule, Rfl: 3   Hypromellose (ARTIFICIAL TEARS OP), Place 1 drop into both eyes 2 (two) times daily., Disp: , Rfl:    lipase/protease/amylase (CREON ) 36000 UNITS CPEP capsule, Take 2 capsules with the first bite of each meal and 1 capsule with the first bite of each snack, Disp: 720 capsule, Rfl: 1   Multiple Vitamin (MULTIVITAMIN PO), Take by mouth daily., Disp: , Rfl:    Omega-3 Fatty Acids (FISH OIL) 1000 MG CAPS, Take 1,000 mg by mouth every evening., Disp: , Rfl:    rosuvastatin  (CRESTOR ) 5 MG tablet, Take 1 tablet (5 mg total) by mouth at bedtime., Disp: 7 tablet, Rfl: 0   SYNTHROID  137 MCG tablet, TAKE 1 TABLET DAILY BEFORE BREAKFAST, Disp: 90 tablet, Rfl: 3   tamsulosin  (FLOMAX ) 0.4 MG CAPS capsule, TAKE 1 CAPSULE TWICE A DAY, Disp: 180 capsule, Rfl: 3   valsartan  (DIOVAN ) 320 MG tablet, Take  1 tablet (320 mg total) by mouth daily., Disp: 7 tablet, Rfl: 0  Social History   Tobacco Use  Smoking Status Former   Current packs/day: 0.00   Average packs/day: 1 pack/day for 18.0 years (18.0 ttl pk-yrs)   Types: Cigarettes   Start date: 03/01/1948   Quit date: 03/01/1966   Years since quitting: 57.3   Passive exposure: Never  Smokeless Tobacco Never    Allergies  Allergen Reactions   Simvastatin Other (See Comments)    MYALGIAS   Oxycodone  Other (See Comments)    Severe constipation   Objective:  There were no vitals filed for this visit. There is no height or weight on file to calculate BMI. Constitutional Well developed. Well nourished.  Vascular Dorsalis pedis pulses faintly palpable  bilaterally. Posterior tibial pulses faintly palpable bilaterally. Capillary refill sluggish to all toes No cyanosis or clubbing noted. Pedal hair growth normal.  Neurologic Normal speech. Oriented to person, place, and time. Epicritic sensation to light touch grossly present bilaterally.  Subjective complaint neuropathic pain noted protective sensations intact  Dermatologic Nails well groomed and normal in appearance. No open wounds. No skin lesions.  Orthopedic: Pain on palpation left hallux IPJ pain with range of motion of the joint.  Some crepitus clinically appreciated.  No pain at the metatarsophalangeal joint no pain with intra-articular first MPJ pain   Radiographs: None Assessment:   1. Diabetic peripheral neuropathy associated with type 2 diabetes mellitus (HCC)   2. Neuropathic pain   3. Toe ulcer, left, limited to breakdown of skin (HCC)   4. Foot ulcer, right, limited to breakdown of skin [facility])       Plan:  Patient was evaluated and treated and all questions answered.  Dependent rubor/vascular abnormality - I explained to patient the etiology of the pain were various treatment options were discussed.  Given the breakdown of the skin patient will benefit from vascular workup.  - Awaiting ABIs PVRs  Right submet 1 ulceration limited to the breakdown of the skin - I explained to the patient the etiology of ulceration [facility] treatment options were discussed.  Continue Betadine wet-to-dry dressing.  Awaiting ABIs PVRs  Left dorsal hallux ulceration limited to the breakdown of skin - Continue Betadine wet-to-dry dressing.  Once his circulation status has been confirmed patient will benefit from graft application   Bilateral neuropathic pain with a history of back surgery - Will transition patient to Lyrica  as gabapentin  has been helping some but is making him too drowsy. - I also discussed that patient will benefit from referral to back specialist he states  understanding and would like to proceed

## 2023-06-29 DIAGNOSIS — R2681 Unsteadiness on feet: Secondary | ICD-10-CM | POA: Diagnosis not present

## 2023-06-29 DIAGNOSIS — M6281 Muscle weakness (generalized): Secondary | ICD-10-CM | POA: Diagnosis not present

## 2023-06-30 ENCOUNTER — Ambulatory Visit: Admitting: Podiatry

## 2023-07-01 DIAGNOSIS — M6281 Muscle weakness (generalized): Secondary | ICD-10-CM | POA: Diagnosis not present

## 2023-07-01 DIAGNOSIS — R2681 Unsteadiness on feet: Secondary | ICD-10-CM | POA: Diagnosis not present

## 2023-07-04 DIAGNOSIS — R2681 Unsteadiness on feet: Secondary | ICD-10-CM | POA: Diagnosis not present

## 2023-07-04 DIAGNOSIS — M6281 Muscle weakness (generalized): Secondary | ICD-10-CM | POA: Diagnosis not present

## 2023-07-06 ENCOUNTER — Other Ambulatory Visit: Payer: Self-pay

## 2023-07-06 DIAGNOSIS — R2681 Unsteadiness on feet: Secondary | ICD-10-CM | POA: Diagnosis not present

## 2023-07-06 DIAGNOSIS — M6281 Muscle weakness (generalized): Secondary | ICD-10-CM | POA: Diagnosis not present

## 2023-07-11 ENCOUNTER — Telehealth: Payer: Self-pay | Admitting: Podiatry

## 2023-07-11 DIAGNOSIS — R2681 Unsteadiness on feet: Secondary | ICD-10-CM | POA: Diagnosis not present

## 2023-07-11 DIAGNOSIS — M6281 Muscle weakness (generalized): Secondary | ICD-10-CM | POA: Diagnosis not present

## 2023-07-12 ENCOUNTER — Ambulatory Visit: Payer: Self-pay

## 2023-07-12 NOTE — Telephone Encounter (Signed)
 Copied from CRM [id number]. Topic: Clinical - Red Word Triage >> Jul 12, 2023  9:33 AM [place] J wrote: [facility] that prompted transfer to Nurse Triage: Patient is having extreme pain, and burning with his feet.   Chief Complaint: Foot pain Symptoms: Bilateral foot pain and tingling   Frequency: Constant  Pertinent Negatives: Patient denies any other symptoms at this time  Disposition: [] ED /[] Urgent Care (no appt availability in office) / [x] Appointment(In office/virtual)/ []  [facility]/ [] Home Care/ [] Refused Recommended Disposition /[] [place] [facility]/ []  Follow-up with PCP Additional Notes: Patient reports he has been experiencing worsening bilateral foot pain for the last month. He states he was seen by a specialist but states that they haven't done anything for his pain yes and that he would like to make an appointment to discuss treatment options with his PCP. Appointment made for tomorrow.     Reason for Disposition  [1] MODERATE pain (e.g., interferes with normal activities, limping) AND [2] present > 3 days  Answer Assessment - Initial Assessment Questions 1. ONSET: "When did the pain start?"      A couple of months  2. LOCATION: "Where is the pain located?"      Both feet  3. PAIN: "How bad is the pain?"    (Scale 1-10; or mild, moderate, severe)  - MILD (1-3): doesn't interfere with normal activities.   - MODERATE (4-7): interferes with normal activities (e.g., work or school) or awakens from sleep, limping.   - SEVERE (8-10): excruciating pain, unable to do any normal activities, unable to walk.      3/10 4. WORK OR EXERCISE: "Has there been any recent work or exercise that involved this part of the body?"      No 5. CAUSE: "What do you think is causing the foot pain?"     Unsure  6. OTHER SYMPTOMS: "Do you have any other symptoms?" (e.g., leg pain, rash, fever, numbness)     No  Protocols used: Foot Pain-A-AH

## 2023-07-12 NOTE — Telephone Encounter (Signed)
 NOTED

## 2023-07-13 ENCOUNTER — Encounter: Payer: Self-pay | Admitting: Family Medicine

## 2023-07-13 ENCOUNTER — Ambulatory Visit (INDEPENDENT_AMBULATORY_CARE_PROVIDER_SITE_OTHER): Admitting: Family Medicine

## 2023-07-13 VITALS — BP 130/80 | HR 50 | Temp 97.9°F | Ht 70.0 in | Wt 189.2 lb

## 2023-07-13 DIAGNOSIS — I878 Other specified disorders of veins: Secondary | ICD-10-CM | POA: Diagnosis not present

## 2023-07-13 DIAGNOSIS — E1142 Type 2 diabetes mellitus with diabetic polyneuropathy: Secondary | ICD-10-CM | POA: Diagnosis not present

## 2023-07-13 DIAGNOSIS — E11621 Type 2 diabetes mellitus with foot ulcer: Secondary | ICD-10-CM

## 2023-07-13 DIAGNOSIS — L97521 Non-pressure chronic ulcer of other part of left foot limited to breakdown of skin: Secondary | ICD-10-CM | POA: Diagnosis not present

## 2023-07-13 DIAGNOSIS — M6281 Muscle weakness (generalized): Secondary | ICD-10-CM | POA: Diagnosis not present

## 2023-07-13 DIAGNOSIS — R2681 Unsteadiness on feet: Secondary | ICD-10-CM | POA: Diagnosis not present

## 2023-07-13 MED ORDER — TRAMADOL HCL 50 MG PO TABS: 50.0000 mg | ORAL_TABLET | Freq: Three times a day (TID) | ORAL | 0 refills | Status: DC | PRN

## 2023-07-13 MED ORDER — NORTRIPTYLINE HCL 10 MG PO CAPS: 10.0000 mg | ORAL_CAPSULE | Freq: Every day | ORAL | 2 refills | Status: DC
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
## 2023-07-15 ENCOUNTER — Ambulatory Visit: Attending: Podiatry

## 2023-07-15 DIAGNOSIS — L539 Erythematous condition, unspecified: Secondary | ICD-10-CM | POA: Diagnosis not present

## 2023-07-15 DIAGNOSIS — L97521 Non-pressure chronic ulcer of other part of left foot limited to breakdown of skin: Secondary | ICD-10-CM | POA: Diagnosis not present

## 2023-07-16 LAB — VAS US ABI WITH/WO TBI
Left ABI: 1.12
Right ABI: 1.16

## 2023-07-18 ENCOUNTER — Other Ambulatory Visit: Payer: Medicare Other

## 2023-07-18 DIAGNOSIS — E871 Hypo-osmolality and hyponatremia: Secondary | ICD-10-CM | POA: Diagnosis not present

## 2023-07-18 DIAGNOSIS — M6281 Muscle weakness (generalized): Secondary | ICD-10-CM | POA: Diagnosis not present

## 2023-07-18 DIAGNOSIS — E063 Autoimmune thyroiditis: Secondary | ICD-10-CM | POA: Diagnosis not present

## 2023-07-18 DIAGNOSIS — E119 Type 2 diabetes mellitus without complications: Secondary | ICD-10-CM | POA: Diagnosis not present

## 2023-07-18 DIAGNOSIS — R2681 Unsteadiness on feet: Secondary | ICD-10-CM | POA: Diagnosis not present

## 2023-07-18 LAB — T4, FREE: Free T4: 1.6 ng/dL (ref 0.8–1.8)

## 2023-07-18 LAB — BASIC METABOLIC PANEL WITH GFR
BUN: 16 mg/dL (ref 7–25)
CO2: 27 mmol/L (ref 20–32)
Calcium: 8.7 mg/dL (ref 8.6–10.3)
Chloride: 98 mmol/L (ref 98–110)
Creat: 1 mg/dL (ref 0.70–1.22)
Glucose, Bld: 110 mg/dL — ABNORMAL HIGH (ref 65–99)
Potassium: 5 mmol/L (ref 3.5–5.3)
Sodium: 130 mmol/L — ABNORMAL LOW (ref 135–146)
eGFR: 72 mL/min/{1.73_m2} (ref >=60)

## 2023-07-18 LAB — HEMOGLOBIN A1C
Hgb A1c MFr Bld: 6.9 % — ABNORMAL HIGH (ref <5.7)
Mean Plasma Glucose: 151 mg/dL
eAG (mmol/L): 8.4 mmol/L

## 2023-07-18 LAB — TSH: TSH: 1.52 m[IU]/L (ref 0.40–4.50)

## 2023-07-19 ENCOUNTER — Ambulatory Visit: Payer: Self-pay | Admitting: Endocrinology

## 2023-07-20 DIAGNOSIS — M6281 Muscle weakness (generalized): Secondary | ICD-10-CM | POA: Diagnosis not present

## 2023-07-20 DIAGNOSIS — R2681 Unsteadiness on feet: Secondary | ICD-10-CM | POA: Diagnosis not present

## 2023-07-21 ENCOUNTER — Ambulatory Visit (INDEPENDENT_AMBULATORY_CARE_PROVIDER_SITE_OTHER): Payer: Medicare Other | Admitting: Endocrinology

## 2023-07-21 ENCOUNTER — Encounter: Payer: Self-pay | Admitting: Endocrinology

## 2023-07-21 ENCOUNTER — Ambulatory Visit (INDEPENDENT_AMBULATORY_CARE_PROVIDER_SITE_OTHER): Admitting: Podiatry

## 2023-07-21 VITALS — BP 140/80 | HR 79 | Resp 20 | Ht 70.0 in | Wt 189.2 lb

## 2023-07-21 DIAGNOSIS — E063 Autoimmune thyroiditis: Secondary | ICD-10-CM

## 2023-07-21 DIAGNOSIS — E119 Type 2 diabetes mellitus without complications: Secondary | ICD-10-CM | POA: Diagnosis not present

## 2023-07-21 DIAGNOSIS — E871 Hypo-osmolality and hyponatremia: Secondary | ICD-10-CM

## 2023-07-21 DIAGNOSIS — R600 Localized edema: Secondary | ICD-10-CM | POA: Diagnosis not present

## 2023-07-21 DIAGNOSIS — L97521 Non-pressure chronic ulcer of other part of left foot limited to breakdown of skin: Secondary | ICD-10-CM

## 2023-07-21 DIAGNOSIS — E1142 Type 2 diabetes mellitus with diabetic polyneuropathy: Secondary | ICD-10-CM

## 2023-07-21 NOTE — Progress Notes (Signed)
 Outpatient Endocrinology Note Kyle Cormac Wint, MD   Patient's Name: Kyle Simon    DOB: August 18, 1932    MRN: [id number]  REASON OF VISIT:  Follow-up for hypothyroidism / hyponatremia.  PCP: Felicita Horns, FNP  HISTORY OF PRESENT ILLNESS:   Kyle Simon is a 89 y.o. old male with past medical history as listed below is presented for a follow up for hypothyroidism / hyponatremia / type 2 diabetes mellitus.  Pertinent  History: Patient was previously seen by Dr. Hubert Madden and was last time seen in August 2024.  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 Serum sodium has been low since at least 2011 and has been as low as 125 in the past.  Mostly in the range of low 130s. He has not had any associated symptoms of decreased appetite, nausea or drowsiness Not on any thiazide diuretics or SSRI drugs. Has had negative endocrine work-up for cortisol deficiencies. Initially sodium was significantly better after starting DEMECLOCYCLINE 150 mg daily in 9/21. However this was stopped when his sodium decreased on the treatment down to 126    His urine osmolality was previously high at 247, Urine osmolality has been as high as 551 previously Urine sodium was 130 previously.   He has been advised cut back on his fluid intake especially coffee and also large volumes of water  which he was drinking for his constipation.   With a trial of salt tablets his sodium did not improve, it was still down to 126 in 9/21, also was having nausea from taking more than 1 regular tablet daily.   He was empirically started on FLORINEF  in April 2023 because of persistently low sodium, mild increase in potassium and history of relatively high urine sodium.  CT scan of chest did not show any tumor.  He was taking fludrocortisone  3 times a week  # Primary hypothyroidism -Diagnosed in 2005. At the time of diagnosis patient was not having symptoms of  fatigue, cold sensitivity, difficulty concentrating, dry skin, weight gain. Likely  had only done routine testing and was told that he was hypothyroid and started him on unknown dose of levothyroxine . He did not start feeling any different when he started taking levothyroxine  initially. Also usually does not feel any different with dosage adjustments that have been made. He was referred here because his TSH was tending to be high but also his free T4 was relatively higher in 7/18. On his initial consultation since his TSH was still relatively high and he was taking the equivalent of about 142 g of levothyroxine  daily he was switched to taking 150 g once daily, this has been periodically adjusted   He is now on 137 mcg levothyroxine  daily.   # Type 2 diabetes mellitus on diet control not on antidiabetic medication, he has diabetes for several years.  He has controlled type 2 diabetes mellitus.  He has been monitoring blood sugar at home has One Touch Verio glucometer.  Interval history Recent laboratory results reviewed, normal thyroid  function test, serum sodium is stable 130.  Renal function is stable.  Has been taking levothyroxine  137 mcg daily, denies palpitation or heat intolerance.  Checking blood sugar daily in the morning fasting blood sugar in the range of 80-102 range.  He did not bring glucometer in the clinic today.  Recent hemoglobin A1c 6.9%.  Patient reports he has been following with podiatry, has wound in the leg however not infected currently with bandage.  He reports he has not been taking Florinef   at least for 2 months.  He has not felt any difference without it.  He has mildly elevated blood pressure and pedal edema.  Serum sodium has remained about the same stable 130 without Florinef .  No other complaints today.    Latest Reference Range & Units 07/18/23 08:49  TSH 0.40 - 4.50 mIU/L 1.52  T4,Free(Direct) 0.8 - 1.8 ng/dL 1.6    REVIEW OF SYSTEMS:  As per history of present illness.   PAST MEDICAL HISTORY: Past Medical History:  Diagnosis Date    Arthritis    both hands;Dr.Deveshwar   Constipation due to pain medication    Diabetes mellitus without complication (HCC)    Type II. Uses no medication. Pt controls with diet and weight   Enlarged prostate    Frequent urination at night    GERD (gastroesophageal reflux disease)    History of noncompliance with medical treatment, presenting hazards to health 11/20/2018   Hyperlipidemia associated with type 2 diabetes mellitus (HCC) 04/19/2007   Qualifier: Diagnosis of  By: Donnice Gale MD, William     Hypertension    Hypothyroidism    Polio    as a child w/o complications   Snoring    no sleep apnea   Statin declined-  11/20/2018   patient understands risks associated with this decision and has been advised to restart by cardiologist as well as myself several times   Torn meniscus    Transfusion history 2012   post TKR    PAST SURGICAL HISTORY: Past Surgical History:  Procedure Laterality Date   ANTERIOR LAT LUMBAR FUSION Right 11/03/2017   Procedure: Right Lumbar three-four Anterolateral lumbar interbody fusion with lateral plate;  Surgeon: Manya Sells, MD;  [facility];  Service: Neurosurgery;  Laterality: Right;   BACK SURGERY  2011   hemiarthroplasty 01/11/1999 and 6   CARDIAC CATHETERIZATION  09/2001   negative   COLONOSCOPY     COLONOSCOPY N/A 05/06/2022   Procedure: COLONOSCOPY;  Surgeon: Selena Daily, MD;  Location: [facility] ENDOSCOPY;  Service: Gastroenterology;  Laterality: N/A;   COLONOSCOPY WITH PROPOFOL  N/A 05/05/2022   Procedure: COLONOSCOPY WITH PROPOFOL ;  Surgeon: Selena Daily, MD;  Location: [facility] ENDOSCOPY;  Service: Gastroenterology;  Laterality: N/A;   EYE SURGERY  2003   R&L cataract removed & IOL- 2003 & 2007,Dr.Jenkins   INGUINAL HERNIA REPAIR  2002   right, Dr. Laraine Plate   JOINT REPLACEMENT  2012   left knee replacement   LIPOMA EXCISION     back   MENISCECTOMY Bilateral    Dr. Jinger Mount   PROSTATE SURGERY  07/2006   for urinary urgency, Dr.  Romeo Co CUFF REPAIR Right October, 2016   sinus & throat surgery-1995  1995   TONSILLECTOMY     TOTAL KNEE ARTHROPLASTY  01/11/2011   Procedure: TOTAL KNEE ARTHROPLASTY;  Surgeon: Florencia Hunter, MD;  Location: [facility] OR;  Service: Orthopedics;  Laterality: Left;   TOTAL KNEE ARTHROPLASTY Right 11/22/2016   Procedure: TOTAL KNEE ARTHROPLASTY;  Surgeon: Christie Cox, MD;  Location: [facility] OR;  Service: Orthopedics;  Laterality: Right;   UVULECTOMY  1996   Dr. Archer Kobs   VASECTOMY     WRIST SURGERY  1959   fracture- right   XI ROBOTIC ASSISTED VENTRAL HERNIA N/A 09/07/2020   Procedure: XI ROBOTIC ASSISTED DIAGNOSTIC LAPAROSCOPY;  Surgeon: Alben Alma, MD;  Location: [facility] ORS;  Service: General;  Laterality: N/A;    ALLERGIES: Allergies  Allergen Reactions   Simvastatin Other (  See Comments)    MYALGIAS   Oxycodone  Other (See Comments)    Severe constipation    FAMILY HISTORY:  Family History  Problem Relation Age of Onset   Coronary artery disease Mother    Emphysema Mother    Diverticulosis Mother    Thyroid  disease Mother    Sudden death Father        accident   Asthma Brother    Bone cancer Brother    Diabetes Paternal Grandmother    Thyroid  disease Daughter    Sarcoidosis Son    Anesthesia problems Neg Hx    Hypotension Neg Hx    Malignant hyperthermia Neg Hx    Pseudochol deficiency Neg Hx    Colon cancer Neg Hx     SOCIAL HISTORY: Social History   Socioeconomic History   Marital status: Married    Spouse name: June   Number of children: 7   Years of education: 2 years college   Highest education level: Not on file  Occupational History   Occupation: retired  Tobacco Use   Smoking status: Former    Current packs/day: 0.00    Average packs/day: 1 pack/day for 18.0 years (18.0 ttl pk-yrs)    Types: Cigarettes    Start date: 03/01/1948    Quit date: 03/01/1966    Years since quitting: 57.4    Passive exposure: Never   Smokeless tobacco: Never  Vaping  Use   Vaping status: Never Used  Substance and Sexual Activity   Alcohol  use: Not Currently    Comment: social, maybe 1 drink weekly   Drug use: No   Sexual activity: Yes    Birth control/protection: Post-menopausal, Surgical  Other Topics Concern   Not on file  Social History Narrative   09/20/19   From: [place]iginally[place]   Living: with wife June (1985)   Work: retired from Company secretary, 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      Family: 5 children (one passed away) 2 stepchildren, 14 grandchildren, 9 great-grandchildren - Museum/gallery conservator and granddaughter nearby otherwise across the country       Enjoys: fly and play golf, reading      Exercise: walking the dog   Diet: diabetic diet      Safety   Seat belts: Yes    Guns: Yes  and secure   Safe in relationships: Yes    Social Drivers of Corporate investment banker Strain: Low Risk  (09/09/2022)   Overall Financial Resource Strain (CARDIA)    Difficulty of Paying Living Expenses: Not hard at all  Food Insecurity: No Food Insecurity (11/05/2022)   Hunger Vital Sign    Worried About Running Out of Food in the Last Year: Never true    Ran Out of Food in the Last Year: Never true  Transportation Needs: No Transportation Needs (11/05/2022)   PRAPARE - Administrator, Civil Service (Medical): No    Lack of Transportation (Non-Medical): No  Physical Activity: Sufficiently Active (09/09/2022)   Exercise Vital Sign    Days of Exercise per Week: 3 days    Minutes of Exercise per Session: 60 min  Stress: No Stress Concern Present (09/09/2022)   [employer] of Occupational Health - Occupational Stress Questionnaire    Feeling of Stress : Not at all  Social Connections: Socially Integrated (09/09/2022)   Social Connection and Isolation Panel [NHANES]    Frequency of Communication with Friends and Family: More than three times a  week    Frequency of Social Gatherings with Friends and Family: More than three  times a week    Attends Religious Services: More than 4 times per year    Active Member of Clubs or Organizations: Yes    Attends Banker Meetings: Never    Marital Status: Married    MEDICATIONS:  Current Outpatient Medications  Medication Sig Dispense Refill   celecoxib  (CELEBREX ) 100 MG capsule Take 100 mg by mouth 2 (two) times daily.     CLOBETASOL  PROPIONATE E 0.05 % emollient cream as needed.     diclofenac  Sodium (VOLTAREN ) 1 % GEL Apply 2 g topically at bedtime. (Patient taking differently: Apply 2 g topically as needed.) 100 g 2   Hypromellose (ARTIFICIAL TEARS OP) Place 1 drop into both eyes 2 (two) times daily.     Multiple Vitamin (MULTIVITAMIN PO) Take by mouth daily.     nortriptyline  (PAMELOR ) 10 MG capsule Take 1 capsule (10 mg total) by mouth at bedtime. 30 capsule 2   Omega-3 Fatty Acids (FISH OIL) 1000 MG CAPS Take 1,000 mg by mouth every evening.     rosuvastatin  (CRESTOR ) 5 MG tablet Take 1 tablet (5 mg total) by mouth at bedtime. 7 tablet 0   SYNTHROID  137 MCG tablet TAKE 1 TABLET DAILY BEFORE BREAKFAST 90 tablet 3   tamsulosin  (FLOMAX ) 0.4 MG CAPS capsule TAKE 1 CAPSULE TWICE A DAY 180 capsule 3   traMADol  (ULTRAM ) 50 MG tablet Take 1 tablet (50 mg total) by mouth every 8 (eight) hours as needed for moderate pain (pain score 4-6). 20 tablet 0   valsartan  (DIOVAN ) 320 MG tablet Take 1 tablet (320 mg total) by mouth daily. 7 tablet 0   No current facility-administered medications for this visit.    PHYSICAL EXAM: Vitals:   07/21/23 1329 07/21/23 1338  BP: (!) 156/80 (!) 140/80  Pulse: 79   Resp: 20   SpO2: 99%   Weight: 189 lb 3.2 oz (85.8 kg)   Height: 5\' 10"  (1.778 m)     Body mass index is 27.15 kg/m.  Wt Readings from Last 3 Encounters:  07/21/23 189 lb 3.2 oz (85.8 kg)  07/13/23 189 lb 4 oz (85.8 kg)  05/13/23 189 lb 6.4 oz (85.9 kg)    General: Well developed, well nourished male in no apparent distress.  HEENT: AT/[place], no external  lesions.  Eyes: Conjunctiva clear and no icterus. Lungs: Clear to auscultation, no wheeze. Respirations not labored Heart: S1S2, Regular in rate and rhythm.  Abdomen: Soft, non tender Neurologic: Alert, oriented, normal speech,  no gross focal neurological deficit Extremities: b/l pedal pitting edema, no tremors of outstretched hands Skin: Warm, color good. Psychiatric: Does not appear depressed or anxious  PERTINENT HISTORIC LABORATORY AND IMAGING STUDIES:  All pertinent laboratory results were reviewed. Please see HPI also for further details.   TSH  Date Value Ref Range Status  07/18/2023 1.52 0.40 - 4.50 mIU/L Final  03/09/2023 1.21 0.40 - 4.50 mIU/L Final  12/27/2022 2.76 0.35 - 5.50 uIU/mL Final     ASSESSMENT / PLAN  1. Hyponatremia   2. Diabetes mellitus type II, non insulin  dependent (HCC)   3. Acquired autoimmune hypothyroidism    # Hyponatremia likely SIADH -Patient has longstanding hyponatremia, mostly serum sodium is stable at low 130s.Usually asymptomatic. - Not taking Florinef  for last 2 months.  He was taking 0.1 mg 1 tablet 3 times a week.  He has remained stable serum sodium of  130.  Currently with pedal edema and mild elevated blood pressure.  We will not restart Florinef . -Discussed about avoiding excessive intake of fluid/water /coffee. -Check BMP with EGFR follow-up visit.  # Primary hypothyroidism -Currently taking Synthroid  brand name 137 mcg daily. - Continue current dose, recent thyroid  function test normal.  # Type 2 diabetes mellitus -Controlled, currently is not on antidiabetic medications.  Hemoglobin A1c 6.9%, ?  Possibly falsely low due to ?  Anemia. -Patient reports his fasting blood sugar at home 80-100 range.  No glucometer to download and review in the clinic today. -Advised to check blood sugar in the morning fasting and at bedtime.  If the blood sugar in the morning fasting is around 150 or more and if the blood sugar at bedtime is around  200 or more asked to call our clinic we may have to start antidiabetic medication.  Otherwise we can monitor without antidiabetic medication on diet control for now. -Check urine microalbumin creatinine ratio today.  Diagnoses and all orders for this visit:  Hyponatremia -     Basic Metabolic Panel Without GFR  Diabetes mellitus type II, non insulin  dependent (HCC) -     Microalbumin / creatinine urine ratio -     Hemoglobin A1c  Acquired autoimmune hypothyroidism -     T4, free -     TSH    DISPOSITION Follow up in clinic in 5 months suggested.  Labs today and prior to follow-up visit as ordered.  All questions answered and patient verbalized understanding of the plan.  Kyle Zayde Stroupe, MD [facility] Endocrinology [facility] [address] [place], [address] [place], [place] [address] Phone # [phone]  At least part of this note was generated using voice recognition software. Inadvertent word errors may have occurred, which were not recognized during the proofreading process.

## 2023-07-21 NOTE — Progress Notes (Signed)
 Subjective:  Patient ID: Kyle Simon, male    DOB: [birth year],  MRN: [id number]  Chief Complaint  Patient presents with   Foot Pain    89 y.o. male presents for wound care.  Patient presents with complaint left hallux dorsal ulceration with fat layer exposed.  Patient states been present for quite some time he has been conservatively treated by me for greater than 4 weeks.  He just had his ABIs done which shows normal blood flow to both lower extremity.  At this time patient would like to discuss other treatment options including synthetic skin/skin substitute.  Denies any other acute complaints   Review of Systems: Negative except as noted in the HPI. Denies N/V/F/Ch.  Past Medical History:  Diagnosis Date   Arthritis    both hands;Dr.Deveshwar   Constipation due to pain medication    Diabetes mellitus without complication (HCC)    Type II. Uses no medication. Pt controls with diet and weight   Enlarged prostate    Frequent urination at night    GERD (gastroesophageal reflux disease)    History of noncompliance with medical treatment, presenting hazards to health 11/20/2018   Hyperlipidemia associated with type 2 diabetes mellitus (HCC) 04/19/2007   Qualifier: Diagnosis of  By: Donnice Gale MD, Sammie Crigler     Hypertension    Hypothyroidism    Polio    as a child w/o complications   Snoring    no sleep apnea   Statin declined-  11/20/2018   patient understands risks associated with this decision and has been advised to restart by cardiologist as well as myself several times   Torn meniscus    Transfusion history 2012   post TKR    Current Outpatient Medications:    celecoxib  (CELEBREX ) 100 MG capsule, Take 100 mg by mouth 2 (two) times daily., Disp: , Rfl:    CLOBETASOL  PROPIONATE E 0.05 % emollient cream, as needed., Disp: , Rfl:    diclofenac  Sodium (VOLTAREN ) 1 % GEL, Apply 2 g topically at bedtime. (Patient taking differently: Apply 2 g topically as needed.), Disp: 100 g,  Rfl: 2   Hypromellose (ARTIFICIAL TEARS OP), Place 1 drop into both eyes 2 (two) times daily., Disp: , Rfl:    Multiple Vitamin (MULTIVITAMIN PO), Take by mouth daily., Disp: , Rfl:    nortriptyline  (PAMELOR ) 10 MG capsule, Take 1 capsule (10 mg total) by mouth at bedtime., Disp: 30 capsule, Rfl: 2   Omega-3 Fatty Acids (FISH OIL) 1000 MG CAPS, Take 1,000 mg by mouth every evening., Disp: , Rfl:    rosuvastatin  (CRESTOR ) 5 MG tablet, Take 1 tablet (5 mg total) by mouth at bedtime., Disp: 7 tablet, Rfl: 0   SYNTHROID  137 MCG tablet, TAKE 1 TABLET DAILY BEFORE BREAKFAST, Disp: 90 tablet, Rfl: 3   tamsulosin  (FLOMAX ) 0.4 MG CAPS capsule, TAKE 1 CAPSULE TWICE A DAY, Disp: 180 capsule, Rfl: 3   traMADol  (ULTRAM ) 50 MG tablet, Take 1 tablet (50 mg total) by mouth every 8 (eight) hours as needed for moderate pain (pain score 4-6)., Disp: 20 tablet, Rfl: 0   valsartan  (DIOVAN ) 320 MG tablet, Take 1 tablet (320 mg total) by mouth daily., Disp: 7 tablet, Rfl: 0  Social History   Tobacco Use  Smoking Status Former   Current packs/day: 0.00   Average packs/day: 1 pack/day for 18.0 years (18.0 ttl pk-yrs)   Types: Cigarettes   Start date: 03/01/1948   Quit date: 03/01/1966   Years since quitting: 58.4  Passive exposure: Never  Smokeless Tobacco Never    Allergies  Allergen Reactions   Simvastatin Other (See Comments)    MYALGIAS   Oxycodone  Other (See Comments)    Severe constipation   Objective:  There were no vitals filed for this visit. There is no height or weight on file to calculate BMI. Constitutional Well developed. Well nourished.  Vascular Dorsalis pedis pulses palpable bilaterally. Posterior tibial pulses palpable bilaterally. Capillary refill normal to all digits.  No cyanosis or clubbing noted. Pedal hair growth normal.  Neurologic Normal speech. Oriented to person, place, and time. Protective sensation absent  Dermatologic Wound Location: Left dorsal toe ulceration fat  layer exposed does not probe down to deep tissue or bone.  No clinical signs of infection noted no redness noted. Wound Base: Mixed Granular/Fibrotic Peri-wound: Calloused Exudate: Scant/small amount Serous exudate Wound Measurements: - See below  Orthopedic: No pain to palpation either foot.   Radiographs: None   Assessment:   1. Toe ulcer, left, limited to breakdown of skin (HCC)   2. Diabetic peripheral neuropathy associated with type 2 diabetes mellitus (HCC)   3. Bilateral leg edema    Plan:  Patient was evaluated and treated and all questions answered.  Bilateral calf pain with underlying varicose veins - Patient will benefit from DVT rule out as patient has some varicose veins - If positive patient will benefit from DVT management and will be referred - Discussed aggressive compression and elevation   Ulcer left dorsal toe ulceration with fat layer exposed -Debridement as below. -Dressed with triple antibiotic Band-Aid, DSD. -Continue off-loading with surgical shoe. - ABIs PVRs were within normal limits no further intervention indicated - Patient does not currently smoke - At this time given the patient's failed local wound care he will benefit from advanced wound care with skin substitute.  I will work on the approval  Procedure: Excisional Debridement of Wound Tool: Sharp chisel blade/tissue nipper Rationale: Removal of non-viable soft tissue from the wound to promote healing.  Anesthesia: none Pre-Debridement Wound Measurements: 1.5 cm x 1 cm x 0.3 cm  Post-Debridement Wound Measurements: 1.5 cm x 1 cm x 03 cm  Type of Debridement: Sharp Excisional Tissue Removed: Non-viable soft tissue Blood loss: Minimal (<50cc) Depth of Debridement: subcutaneous tissue. Technique: Sharp excisional debridement to bleeding, viable wound base.  Wound Progress: The wound had reached a point of stagnation.  Patient will benefit from synthetic skin/skin substitute Site healing  conversation 7 Dressing: Dry, sterile, compression dressing. Disposition: Patient tolerated procedure well. Patient to return in 1 week for follow-up.  No follow-ups on file.

## 2023-07-22 LAB — MICROALBUMIN / CREATININE URINE RATIO
Creatinine, Urine: 136 mg/dL (ref 20–320)
Microalb Creat Ratio: 2 mg/g{creat} (ref <30)
Microalb, Ur: 0.3 mg/dL

## 2023-07-27 DIAGNOSIS — R2681 Unsteadiness on feet: Secondary | ICD-10-CM | POA: Diagnosis not present

## 2023-07-27 DIAGNOSIS — M6281 Muscle weakness (generalized): Secondary | ICD-10-CM | POA: Diagnosis not present

## 2023-07-28 ENCOUNTER — Ambulatory Visit (INDEPENDENT_AMBULATORY_CARE_PROVIDER_SITE_OTHER)

## 2023-07-28 DIAGNOSIS — R600 Localized edema: Secondary | ICD-10-CM

## 2023-07-28 NOTE — Progress Notes (Unsigned)
  Cardiology Office Note:   Date:  07/29/2023  ID:  Kyle Simon, DOB 07/15/1932, MRN [id number] PCP: Kyle Horns, FNP  [facility] Providers Cardiologist:  Eilleen Grates, MD {  History of Present Illness:   Kyle Simon is a 89 y.o. male who presents for follow up with SOB and abnormal EKG.   He had a low risk stress echo in 2012.  He had a distant cardiac cath.   He had dyspnea at a previous visit and I sent him for a Lexiscan  Myoview .  This was negative for ischemia.     Since I last saw him he developed some increased lower extremity swelling.  He called to be added to my schedule. This probably started around April.  His weight is actually unchanged.  He went to see podiatry.  He does have a nonhealing ulcer on his left great toe.  He had venous Dopplers with no evidence of DVT.  He had normal ABIs and I was able to review these.  He was sent here because he might have heart failure.  However, he is not describing new dyspnea.  He is not having any new PND or orthopnea.  He is not having any new palpitations, presyncope or syncope.  He moves somewhat slowly because of joint and back problems.    ROS:  Positive for itching nose.  The patient denies any new symptoms such as chest discomfort, neck or arm discomfort. There has been no new shortness of breath, PND or orthopnea. There have been no reported palpitations, presyncope or syncope.  Studies Reviewed:    EKG:   NA  Risk Assessment/Calculations:              Physical Exam:   VS:  BP 122/82 (BP Location: Right Arm, Patient Position: Sitting, Cuff Size: Normal)   Pulse 69   Ht 5\' 10"  (1.778 m)   Wt 187 lb 9.6 oz (85.1 kg)   SpO2 99%   BMI 26.92 kg/m    Wt Readings from Last 3 Encounters:  07/29/23 187 lb 9.6 oz (85.1 kg)  07/21/23 189 lb 3.2 oz (85.8 kg)  07/13/23 189 lb 4 oz (85.8 kg)     GEN: Well nourished, well developed in no acute distress NECK: No JVD; No carotid bruits CARDIAC: RRR, 2 out of 6  brief apical systolic murmur radiating slightly out the aortic outflow tract and heard best at the right upper sternal border murmurs, rubs, gallops RESPIRATORY:  Clear to auscultation without rales, wheezing or rhonchi  ABDOMEN: Soft, non-tender, non-distended EXTREMITIES: Mild foot edema;   small ulcer on the left great toe  ASSESSMENT AND PLAN:   EDEMA: The patient does have lower extremity edema.  He has had some chronic dyspnea.  He has a murmur as defined.  I do not think this is overt heart failure but I will check an echocardiogram and a BNP level.  If these come back unremarkable no further cardiac workup.  HTN:  The blood pressure is at target.  I doubt that his medicines are related.  I am going to continue the meds as listed.   AORTIC ATHEROSCLEROSIS: He is gena continue with primary risk reduction.   DYSLIPIDEMIA:    His LDL was 29.  No change in therapy.     Follow up with me in 1 year.  Signed, Eilleen Grates, MD

## 2023-07-29 ENCOUNTER — Ambulatory Visit: Attending: Cardiology | Admitting: Cardiology

## 2023-07-29 ENCOUNTER — Encounter: Payer: Self-pay | Admitting: Cardiology

## 2023-07-29 VITALS — BP 122/82 | HR 69 | Ht 70.0 in | Wt 187.6 lb

## 2023-07-29 DIAGNOSIS — M6281 Muscle weakness (generalized): Secondary | ICD-10-CM | POA: Diagnosis not present

## 2023-07-29 DIAGNOSIS — R0602 Shortness of breath: Secondary | ICD-10-CM | POA: Diagnosis not present

## 2023-07-29 DIAGNOSIS — R2681 Unsteadiness on feet: Secondary | ICD-10-CM | POA: Diagnosis not present

## 2023-07-29 DIAGNOSIS — R600 Localized edema: Secondary | ICD-10-CM | POA: Insufficient documentation

## 2023-07-29 DIAGNOSIS — I700 Atherosclerosis of aorta: Secondary | ICD-10-CM | POA: Insufficient documentation

## 2023-07-29 DIAGNOSIS — E785 Hyperlipidemia, unspecified: Secondary | ICD-10-CM | POA: Diagnosis not present

## 2023-07-29 DIAGNOSIS — I10 Essential (primary) hypertension: Secondary | ICD-10-CM | POA: Diagnosis not present

## 2023-07-29 NOTE — Patient Instructions (Signed)
 Medication Instructions:  Your physician recommends that you continue on your current medications as directed. Please refer to the Current Medication list given to you today.  *If you need a refill on your cardiac medications before your next appointment, please call your pharmacy*  Lab Work: BNP today If you have labs (blood work) drawn today and your tests are completely normal, you will receive your results only by: MyChart Message (if you have MyChart) OR A paper copy in the mail If you have any lab test that is abnormal or we need to change your treatment, we will call you to review the results.  Testing/Procedures: Echo Your physician has requested that you have an echocardiogram. Echocardiography is a painless test that uses sound waves to create images of your heart. It provides your doctor with information about the size and shape of your heart and how well your heart's chambers and valves are working. This procedure takes approximately one hour. There are no restrictions for this procedure. Please do NOT wear cologne, perfume, aftershave, or lotions (deodorant is allowed). Please arrive 15 minutes prior to your appointment time.  Please note: We ask at that you not bring children with you during ultrasound (echo/ vascular) testing. Due to room size and safety concerns, children are not allowed in the ultrasound rooms during exams. Our front office staff cannot provide observation of children in our lobby area while testing is being conducted. An adult accompanying a patient to their appointment will only be allowed in the ultrasound room at the discretion of the ultrasound technician under special circumstances. We apologize for any inconvenience.   Follow-Up: At [facility], you and your health needs are our priority.  As part of our continuing mission to provide you with exceptional heart care, our providers are all part of one team.  This team includes your primary  Cardiologist (physician) and Advanced Practice Providers or APPs (Physician Assistants and Nurse Practitioners) who all work together to provide you with the care you need, when you need it.  Your next appointment:   1 year  Provider:   Lavonne Prairie, MD  We recommend signing up for the patient portal called "MyChart".  Sign up information is provided on this After Visit Summary.  MyChart is used to connect with patients for Virtual Visits (Telemedicine).  Patients are able to view lab/test results, encounter notes, upcoming appointments, etc.  Non-urgent messages can be sent to your provider as well.   To learn more about what you can do with MyChart, go to [email or web address].

## 2023-07-30 LAB — PRO B NATRIURETIC PEPTIDE: NT-Pro BNP: 290 pg/mL (ref 0–486)

## 2023-08-01 ENCOUNTER — Encounter (INDEPENDENT_AMBULATORY_CARE_PROVIDER_SITE_OTHER): Payer: Medicare Other | Admitting: Ophthalmology

## 2023-08-01 DIAGNOSIS — I10 Essential (primary) hypertension: Secondary | ICD-10-CM | POA: Diagnosis not present

## 2023-08-01 DIAGNOSIS — H43813 Vitreous degeneration, bilateral: Secondary | ICD-10-CM | POA: Diagnosis not present

## 2023-08-01 DIAGNOSIS — E113393 Type 2 diabetes mellitus with moderate nonproliferative diabetic retinopathy without macular edema, bilateral: Secondary | ICD-10-CM | POA: Diagnosis not present

## 2023-08-01 DIAGNOSIS — H35033 Hypertensive retinopathy, bilateral: Secondary | ICD-10-CM

## 2023-08-01 DIAGNOSIS — Z7984 Long term (current) use of oral hypoglycemic drugs: Secondary | ICD-10-CM

## 2023-08-02 ENCOUNTER — Ambulatory Visit: Payer: Self-pay | Admitting: Cardiology

## 2023-08-03 DIAGNOSIS — R2681 Unsteadiness on feet: Secondary | ICD-10-CM | POA: Diagnosis not present

## 2023-08-03 DIAGNOSIS — M6281 Muscle weakness (generalized): Secondary | ICD-10-CM | POA: Diagnosis not present

## 2023-08-05 ENCOUNTER — Other Ambulatory Visit: Payer: Self-pay | Admitting: Family Medicine

## 2023-08-05 DIAGNOSIS — M6281 Muscle weakness (generalized): Secondary | ICD-10-CM | POA: Diagnosis not present

## 2023-08-05 DIAGNOSIS — R2681 Unsteadiness on feet: Secondary | ICD-10-CM | POA: Diagnosis not present

## 2023-08-05 NOTE — Telephone Encounter (Signed)
 Last office visit 07/13/23 with Dr. Geralyn Knee for Foot pain.  Last refilled 07/13/23 for #30 with 2 refills.  Pharmacy is requesting 90 day supply.   Please advise if appropriate.

## 2023-08-08 DIAGNOSIS — M6281 Muscle weakness (generalized): Secondary | ICD-10-CM | POA: Diagnosis not present

## 2023-08-08 DIAGNOSIS — R2681 Unsteadiness on feet: Secondary | ICD-10-CM | POA: Diagnosis not present

## 2023-08-08 MED ORDER — FUROSEMIDE 20 MG PO TABS: 20.0000 mg | ORAL_TABLET | Freq: Every day | ORAL | 0 refills | Status: DC

## 2023-08-08 NOTE — Telephone Encounter (Signed)
 Left voice message to call back 6/9

## 2023-08-08 NOTE — Telephone Encounter (Signed)
-----   Message from Nurse Ruthann Cover S sent at 08/04/2023 10:58 AM EDT -----  ----- Message ----- From: Eilleen Grates, MD Sent: 08/04/2023   7:43 AM EDT To: [facility] Triage  Please see that the last response on this was in error.  This is the BNP which is mildly elevated.  Echo is pending.  I would like to try Lasix 20 mg PO daily for 7 days to see if breathing is better.

## 2023-08-08 NOTE — Telephone Encounter (Signed)
 I typically have PCP's manage diabetic neuropathy.  I tried a TCA since he was unable to tolerate gabapentin  and lyrica .

## 2023-08-08 NOTE — Telephone Encounter (Signed)
 Spoke with pt's wife (per DPR) regarding his results. Wife aware of prescription for Lasix. Lasix 20 mg PO daily for 7 days was ordered and sent to pt's pharmacy of choice. Wife verbalized understanding. All questions if any were answered.

## 2023-08-08 NOTE — Telephone Encounter (Signed)
  Pt's wife returning call, she said to call her for the results

## 2023-08-08 NOTE — Addendum Note (Signed)
 Addended by: Francely Craw N on: 08/08/2023 02:47 PM   Modules accepted: Orders

## 2023-08-09 ENCOUNTER — Ambulatory Visit (INDEPENDENT_AMBULATORY_CARE_PROVIDER_SITE_OTHER): Admitting: Podiatry

## 2023-08-09 DIAGNOSIS — L97522 Non-pressure chronic ulcer of other part of left foot with fat layer exposed: Secondary | ICD-10-CM

## 2023-08-09 MED ORDER — TRAMADOL HCL 50 MG PO TABS: 50.0000 mg | ORAL_TABLET | Freq: Three times a day (TID) | ORAL | 0 refills | Status: AC | PRN

## 2023-08-09 NOTE — Progress Notes (Signed)
 Application of Amnio-Maxx Graft: -arterial studies and/or Vascular intervention: ABIs PVR shows triphasic flow to both lower extremity -Patient has severe morbidity: Diabetes -Patient was also nutritionally optimized and encouraged protein rich diet -Smoking cessation was discussed in extensive detail however patient does not currently smoke  -Wound start date: 06/23/2023 -Interventions attempted: Betadine wet to dry/local wound care, offloading, immobilization  -Decision was made to use Amniotic graft since partial / full thickness skin grafts such as Theraskin can lead to scarring and wound recurrence. The clinic did not use any synthetic grafts due to higher chance of foreign body reactions /rejection. Cryo-preserved amniotic graft was not used since storage infrastructure is not available at this outpatient clinic and the patient is unwilling to go to a wound care center. Grafts requiring upfront purchase were not considered due to financial limitations of this outpatient clinic and thus only grafts available on consignment were considered. Amionitic was chosen based on various characteristics including but not limited to scaffolding properties, heavy chain hyaluronic acid, cytokines, and grown factors to expedite wound healing.  Graft application: #1  -The wound was cleansed with saline. Selective debridement techniques to remove devitalized tissue was performed through subcutaneous tissue with 15 blade until viable bleeding tissue was encountered. -Topical application of Amnio-Max graft was placed over the wound and dressed with adaptic non-adherent, gauze, kling and ACE bandage. -Product Code: AMN-22 -Expiration: 08/27/2027 -Lot# [id number]  Procedure: Excisional Debridement of Wound Tool: Sharp chisel blade/tissue nipper Rationale: Removal of non-viable soft tissue from the wound to promote healing.  Anesthesia: none Pre-Debridement Wound Measurements: 1.5 cm x 1 cm x 0.3 cm   Post-Debridement Wound Measurements: 1.5 cm x 1 cm x 0.3 cm  Type of Debridement: Sharp Excisional Tissue Removed: Non-viable soft tissue Blood loss: Minimal (<50cc) Depth of Debridement: subcutaneous tissue. Technique: Sharp excisional debridement to bleeding, viable wound base.  Wound Progress: This my initial evaluation continue monitor the progression of the wound Dressing: Dry, sterile, compression dressing. Disposition: Patient tolerated procedure well. Patient to return in 1 week for follow-up.  -The entire graft was utilized and non discarded. Graft was used in wound described above. I folded the graft in order to maximize amount of product used for the wound and maximize benefit from scaffold properties of the amniotic graft.

## 2023-08-11 DIAGNOSIS — R2681 Unsteadiness on feet: Secondary | ICD-10-CM | POA: Diagnosis not present

## 2023-08-11 DIAGNOSIS — M6281 Muscle weakness (generalized): Secondary | ICD-10-CM | POA: Diagnosis not present

## 2023-08-15 ENCOUNTER — Ambulatory Visit: Payer: Medicare Other | Admitting: Podiatry

## 2023-08-16 ENCOUNTER — Ambulatory Visit (INDEPENDENT_AMBULATORY_CARE_PROVIDER_SITE_OTHER): Admitting: Podiatry

## 2023-08-16 DIAGNOSIS — L97522 Non-pressure chronic ulcer of other part of left foot with fat layer exposed: Secondary | ICD-10-CM | POA: Diagnosis not present

## 2023-08-16 NOTE — Progress Notes (Signed)
 Application of Amnio-Maxx Graft: -arterial studies and/or Vascular intervention: ABIs PVR shows triphasic flow to both lower extremity -Patient has severe morbidity: Diabetes -Patient was also nutritionally optimized and encouraged protein rich diet -Smoking cessation was discussed in extensive detail however patient does not currently smoke  -Wound start date: 06/23/2023 -Interventions attempted: Betadine wet to dry/local wound care, offloading, immobilization  -Decision was made to use Amniotic graft since partial / full thickness skin grafts such as Theraskin can lead to scarring and wound recurrence. The clinic did not use any synthetic grafts due to higher chance of foreign body reactions /rejection. Cryo-preserved amniotic graft was not used since storage infrastructure is not available at this outpatient clinic and the patient is unwilling to go to a wound care center. Grafts requiring upfront purchase were not considered due to financial limitations of this outpatient clinic and thus only grafts available on consignment were considered. Amionitic was chosen based on various characteristics including but not limited to scaffolding properties, heavy chain hyaluronic acid, cytokines, and grown factors to expedite wound healing.  Graft application: #2 -The wound was cleansed with saline. Selective debridement techniques to remove devitalized tissue was performed through subcutaneous tissue with 15 blade until viable bleeding tissue was encountered. -Topical application of Amnio-Max graft was placed over the wound and dressed with adaptic non-adherent, gauze, kling and ACE bandage. -Product Code: AMN-22 -Expiration:08/30/2027 -Lot# [id number]  Procedure: Excisional Debridement of Wound Tool: Sharp chisel blade/tissue nipper Rationale: Removal of non-viable soft tissue from the wound to promote healing.  Anesthesia: none Pre-Debridement Wound Measurements: 1.3 cm x 0.9 cm x 0.3 cm   Post-Debridement Wound Measurements: 1.3 cm x 0.9 cm x 0.3 cm  Type of Debridement: Sharp Excisional Tissue Removed: Non-viable soft tissue Blood loss: Minimal (<50cc) Depth of Debridement: subcutaneous tissue. Technique: Sharp excisional debridement to bleeding, viable wound base.  Wound Progress: Wound decreased slightly more granulation tissue Dressing: Dry, sterile, compression dressing. Disposition: Patient tolerated procedure well. Patient to return in 1 week for follow-up.    -The entire graft was utilized and non discarded. Graft was used in wound described above. I folded the graft in order to maximize amount of product used for the wound and maximize benefit from scaffold properties of the amniotic graft.

## 2023-08-17 ENCOUNTER — Ambulatory Visit: Admitting: Podiatry

## 2023-08-17 DIAGNOSIS — M6281 Muscle weakness (generalized): Secondary | ICD-10-CM | POA: Diagnosis not present

## 2023-08-17 DIAGNOSIS — R2681 Unsteadiness on feet: Secondary | ICD-10-CM | POA: Diagnosis not present

## 2023-08-20 ENCOUNTER — Other Ambulatory Visit: Payer: Self-pay | Admitting: Family

## 2023-08-20 DIAGNOSIS — E1159 Type 2 diabetes mellitus with other circulatory complications: Secondary | ICD-10-CM

## 2023-08-23 ENCOUNTER — Ambulatory Visit (INDEPENDENT_AMBULATORY_CARE_PROVIDER_SITE_OTHER): Admitting: Podiatry

## 2023-08-23 DIAGNOSIS — L97522 Non-pressure chronic ulcer of other part of left foot with fat layer exposed: Secondary | ICD-10-CM

## 2023-08-23 NOTE — Progress Notes (Signed)
 Application of Amnio-Maxx Graft: -arterial studies and/or Vascular intervention: ABIs PVR shows triphasic flow to both lower extremity -Patient has severe morbidity: Diabetes -Patient was also nutritionally optimized and encouraged protein rich diet -Smoking cessation was discussed in extensive detail however patient does not currently smoke  -Wound start date: 06/23/2023 -Interventions attempted: Betadine wet to dry/local wound care, offloading, immobilization  -Decision was made to use Amniotic graft since partial / full thickness skin grafts such as Theraskin can lead to scarring and wound recurrence. The clinic did not use any synthetic grafts due to higher chance of foreign body reactions /rejection. Cryo-preserved amniotic graft was not used since storage infrastructure is not available at this outpatient clinic and the patient is unwilling to go to a wound care center. Grafts requiring upfront purchase were not considered due to financial limitations of this outpatient clinic and thus only grafts available on consignment were considered. Amionitic was chosen based on various characteristics including but not limited to scaffolding properties, heavy chain hyaluronic acid, cytokines, and grown factors to expedite wound healing.  Graft application: #3 -The wound was cleansed with saline. Selective debridement techniques to remove devitalized tissue was performed through subcutaneous tissue with 15 blade until viable bleeding tissue was encountered. -Topical application of Amnio-Max graft was placed over the wound and dressed with adaptic non-adherent, gauze, kling and ACE bandage. -Product Code: AMN-22 -Expiration:08/30/2027 -Lot# [id number]  Procedure: Excisional Debridement of Wound Tool: Sharp chisel blade/tissue nipper Rationale: Removal of non-viable soft tissue from the wound to promote healing.  Anesthesia: none Pre-Debridement Wound Measurements: 1.1 cm x 0.9 cm x 0.3 cm   Post-Debridement Wound Measurements: 1.1 cm x 0.9 cm x 0.3 cm  Type of Debridement: Sharp Excisional Tissue Removed: Non-viable soft tissue Blood loss: Minimal (<50cc) Depth of Debridement: subcutaneous tissue. Technique: Sharp excisional debridement to bleeding, viable wound base.  Wound Progress: Wound decreased slightly more granulation tissue Dressing: Dry, sterile, compression dressing. Disposition: Patient tolerated procedure well. Patient to return in 1 week for follow-up.    -The entire graft was utilized and non discarded. Graft was used in wound described above. I folded the graft in order to maximize amount of product used for the wound and maximize benefit from scaffold properties of the amniotic graft.

## 2023-08-24 DIAGNOSIS — M6281 Muscle weakness (generalized): Secondary | ICD-10-CM | POA: Diagnosis not present

## 2023-08-24 DIAGNOSIS — R2681 Unsteadiness on feet: Secondary | ICD-10-CM | POA: Diagnosis not present

## 2023-08-25 ENCOUNTER — Telehealth: Payer: Self-pay | Admitting: Cardiology

## 2023-08-25 NOTE — Telephone Encounter (Signed)
 Per pt breathing is better and he has not had any Lasix  for 3 or 4 days Pt is wanting to know if he should continue taking Lasix  or not .Will forward to Dr Lavona for review

## 2023-08-25 NOTE — Telephone Encounter (Signed)
 Pt c/o medication issue:  1. Name of Medication: Furosemide   2. How are you currently taking this medication (dosage and times per day)?   3. Are you having a reaction (difficulty breathing--STAT)?   4. What is your medication issue? Patient wants to know if Dr Lavona wants him to continue taking this medicine.

## 2023-08-29 DIAGNOSIS — H40053 Ocular hypertension, bilateral: Secondary | ICD-10-CM | POA: Diagnosis not present

## 2023-08-29 DIAGNOSIS — Z961 Presence of intraocular lens: Secondary | ICD-10-CM | POA: Diagnosis not present

## 2023-08-29 DIAGNOSIS — H04123 Dry eye syndrome of bilateral lacrimal glands: Secondary | ICD-10-CM | POA: Diagnosis not present

## 2023-08-29 DIAGNOSIS — E113393 Type 2 diabetes mellitus with moderate nonproliferative diabetic retinopathy without macular edema, bilateral: Secondary | ICD-10-CM | POA: Diagnosis not present

## 2023-08-29 NOTE — Telephone Encounter (Signed)
 Left voice message to call back 6/30

## 2023-08-30 ENCOUNTER — Other Ambulatory Visit: Payer: Self-pay | Admitting: Cardiology

## 2023-08-30 ENCOUNTER — Ambulatory Visit (INDEPENDENT_AMBULATORY_CARE_PROVIDER_SITE_OTHER): Admitting: Podiatry

## 2023-08-30 DIAGNOSIS — L97522 Non-pressure chronic ulcer of other part of left foot with fat layer exposed: Secondary | ICD-10-CM

## 2023-08-30 MED ORDER — HYDROCODONE-ACETAMINOPHEN 5-325 MG PO TABS: 1.0000 | ORAL_TABLET | Freq: Four times a day (QID) | ORAL | 0 refills | Status: DC | PRN

## 2023-08-30 NOTE — Progress Notes (Signed)
 Application of Amnio-Maxx Graft: -arterial studies and/or Vascular intervention: ABIs PVR shows triphasic flow to both lower extremity -Patient has severe morbidity: Diabetes -Patient was also nutritionally optimized and encouraged protein rich diet -Smoking cessation was discussed in extensive detail however patient does not currently smoke  -Wound start date: 06/23/2023 -Interventions attempted: Betadine wet to dry/local wound care, offloading, immobilization  -Decision was made to use Amniotic graft since partial / full thickness skin grafts such as Theraskin can lead to scarring and wound recurrence. The clinic did not use any synthetic grafts due to higher chance of foreign body reactions /rejection. Cryo-preserved amniotic graft was not used since storage infrastructure is not available at this outpatient clinic and the patient is unwilling to go to a wound care center. Grafts requiring upfront purchase were not considered due to financial limitations of this outpatient clinic and thus only grafts available on consignment were considered. Amionitic was chosen based on various characteristics including but not limited to scaffolding properties, heavy chain hyaluronic acid, cytokines, and grown factors to expedite wound healing.  Graft application: #4 -The wound was cleansed with saline. Selective debridement techniques to remove devitalized tissue was performed through subcutaneous tissue with 15 blade until viable bleeding tissue was encountered. -Topical application of Amnio-Max graft was placed over the wound and dressed with adaptic non-adherent, gauze, kling and ACE bandage. -Product Code: AMN-22 -Expiration:08/31/2027 -Lot# [id number]  Procedure: Excisional Debridement of Wound Tool: Sharp chisel blade/tissue nipper Rationale: Removal of non-viable soft tissue from the wound to promote healing.  Anesthesia: none Pre-Debridement Wound Measurements: 0.9 cm x 0.7 cm x 0.3 cm   Post-Debridement Wound Measurements: 0.9 cm x 0.7 cm x 0.3 cm  Type of Debridement: Sharp Excisional Tissue Removed: Non-viable soft tissue Blood loss: Minimal (<50cc) Depth of Debridement: subcutaneous tissue. Technique: Sharp excisional debridement to bleeding, viable wound base.  Wound Progress: Wound decreased slightly more granulation tissue noted  Dressing: Dry, sterile, compression dressing. Disposition: Patient tolerated procedure well. Patient to return in 1 week for follow-up.    -The entire graft was utilized and non discarded. Graft was used in wound described above. I folded the graft in order to maximize amount of product used for the wound and maximize benefit from scaffold properties of the amniotic graft.

## 2023-09-05 DIAGNOSIS — R2681 Unsteadiness on feet: Secondary | ICD-10-CM | POA: Diagnosis not present

## 2023-09-05 DIAGNOSIS — M6281 Muscle weakness (generalized): Secondary | ICD-10-CM | POA: Diagnosis not present

## 2023-09-05 NOTE — Telephone Encounter (Signed)
 Spoke with pt regarding Lasix . Dr. Lavona suggested the pt take it as needed for swelling. Pt verbalized understanding. All questions if any were answered.

## 2023-09-07 ENCOUNTER — Encounter: Payer: Self-pay | Admitting: *Deleted

## 2023-09-07 ENCOUNTER — Other Ambulatory Visit: Payer: Self-pay | Admitting: Podiatry

## 2023-09-07 MED ORDER — DOXYCYCLINE HYCLATE 100 MG PO TABS: 100.0000 mg | ORAL_TABLET | Freq: Two times a day (BID) | ORAL | 0 refills | Status: DC

## 2023-09-07 NOTE — Progress Notes (Signed)
 Office Visit Note  Patient: Kyle Simon             Date of Birth: 01/19/1933           MRN: [id number]             PCP: Corwin Antu, FNP Referring: Corwin Antu, FNP Visit Date: 09/21/2023 Occupation: @GUAROCC @  Subjective:  Pain in multiple joints  History of Present Illness: Kyle Simon is a 89 y.o. male with osteoarthritis and degenerative disc disease.  He states his left shoulder has been hurting since the fall last year.  He was seen by the orthopedic surgeon and had a cortisone injection in his left shoulder 6 months ago.  He initially had increased pain and then the pain improved.  He still have some limitation of range of motion.  Bilateral total knee replacements are doing well.  He continues to have some lower extremity swelling. He has been having recurrent left great toe ulcer on the dorsal surface.  He has been under care of Dr. Tobie.  Dr. Tobie applied multiple applications of amino max graft.  Lower back pain is manageable.  Activities of Daily Living:  Patient reports morning stiffness for 30 minutes.   Patient Reports nocturnal pain.  Difficulty dressing/grooming: Reports Difficulty climbing stairs: Reports Difficulty getting out of chair: Reports Difficulty using hands for taps, buttons, cutlery, and/or writing: Reports  Review of Systems  Constitutional:  Positive for fatigue.  HENT:  Positive for mouth dryness. Negative for mouth sores.   Eyes:  Positive for dryness.  Respiratory:  Negative for shortness of breath.   Cardiovascular:  Negative for chest pain and palpitations.  Gastrointestinal:  Positive for constipation. Negative for blood in stool and diarrhea.  Endocrine: Negative for increased urination.  Genitourinary:  Negative for involuntary urination.  Musculoskeletal:  Positive for joint pain, gait problem, joint pain, myalgias, morning stiffness and myalgias. Negative for joint swelling, muscle weakness and muscle tenderness.  Skin:   Negative for color change, rash, hair loss and sensitivity to sunlight.  Allergic/Immunologic: Negative for susceptible to infections.  Neurological:  Positive for dizziness. Negative for headaches.  Hematological:  Negative for swollen glands.  Psychiatric/Behavioral:  Negative for depressed mood and sleep disturbance. The patient is not nervous/anxious.     PMFS History:  Patient Active Problem List   Diagnosis Date Noted   Persistent dry cough 09/09/2023   Obstruction of sinonasal region 03/10/2023   Chronic pansinusitis 03/10/2023   Multiple falls 12/28/2022   Reactive depression 12/28/2022   Hyponatremia 12/28/2022   Thoracic aorta atherosclerosis (HCC) 12/01/2022   Calcium  blood decreased 12/01/2022   Cerebral infarction, chronic 12/01/2022   Muscle spasm 12/01/2022   Physical debility 12/01/2022   Weakness of both lower extremities 12/01/2022   Unstable gait 12/01/2022   History of fall 12/01/2022   Elevated fecal calprotectin 05/06/2022   Tinnitus of both ears 04/09/2022   Poor dentition 04/02/2022   Memory loss 11/26/2021   Balance problems 12/17/2020   Dyslipidemia 11/30/2020   Chronic synovitis 01/18/2020   Elevated coronary artery calcium  score 11/25/2018   Aortic atherosclerosis (HCC) 10/05/2017   Chronic pain of left knee 08/22/2017   Lumbar radiculopathy 06/16/2017   DM assoc with CKD (chronic kidney disease), stage III (HCC) 04/14/2017   Arthralgia of right temporomandibular joint 03/02/2017   Chronic eczematous otitis externa of both ears 03/02/2017   Presbycusis of both ears 03/02/2017   ETD (Eustachian tube dysfunction), right 09/15/2016   Trochanteric  bursitis of right hip 09/07/2016   Hypomagnesemia 12/29/2015   Type 2 diabetes mellitus with other specified complication (HCC) 08/05/2015   Chronic rhinitis 11/01/2014   Hypertension associated with diabetes (HCC) 03/20/2014   Spondylolisthesis of lumbar region 10/23/2013   Diverticulosis of colon  without hemorrhage 01/22/2013   Anemia, unspecified 08/13/2012   GERD (gastroesophageal reflux disease) 03/26/2011   SENILE KERATOSIS 06/12/2008   Hyperlipidemia associated with type 2 diabetes mellitus (HCC) 04/19/2007   BPH (benign prostatic hyperplasia) 04/18/2007   Hypothyroidism 04/20/2006    Past Medical History:  Diagnosis Date   Arthritis    both hands;Dr.Kaleigh Spiegelman   Cataract 2010   Constipation due to pain medication    Diabetes mellitus without complication (HCC)    Type II. Uses no medication. Pt controls with diet and weight   Enlarged prostate    Frequent urination at night    GERD (gastroesophageal reflux disease)    History of noncompliance with medical treatment, presenting hazards to health 11/20/2018   Hyperlipidemia associated with type 2 diabetes mellitus (HCC) 04/19/2007   Qualifier: Diagnosis of  By: Tish MD, William     Hypertension    Hypothyroidism    Polio    as a child w/o complications   Snoring    no sleep apnea   Statin declined-  11/20/2018   patient understands risks associated with this decision and has been advised to restart by cardiologist as well as myself several times   Torn meniscus    Transfusion history 2012   post TKR    Family History  Problem Relation Age of Onset   Coronary artery disease Mother    Emphysema Mother    Diverticulosis Mother    Thyroid  disease Mother    Sudden death Father        accident   Asthma Brother    Bone cancer Brother    Diabetes Paternal Grandmother    Thyroid  disease Daughter    Sarcoidosis Son    Anesthesia problems Neg Hx    Hypotension Neg Hx    Malignant hyperthermia Neg Hx    Pseudochol deficiency Neg Hx    Colon cancer Neg Hx    Past Surgical History:  Procedure Laterality Date   ANTERIOR LAT LUMBAR FUSION Right 11/03/2017   Procedure: Right Lumbar three-four Anterolateral lumbar interbody fusion with lateral plate;  Surgeon: Unice Pac, MD;  [facility] OR;  Service:  Neurosurgery;  Laterality: Right;   BACK SURGERY  2011   hemiarthroplasty 01/11/1999 and 6   CARDIAC CATHETERIZATION  09/2001   negative   COLONOSCOPY     COLONOSCOPY N/A 05/06/2022   Procedure: COLONOSCOPY;  Surgeon: Unk Corinn Skiff, MD;  Location: [facility] ENDOSCOPY;  Service: Gastroenterology;  Laterality: N/A;   COLONOSCOPY WITH PROPOFOL  N/A 05/05/2022   Procedure: COLONOSCOPY WITH PROPOFOL ;  Surgeon: Unk Corinn Skiff, MD;  Location: [facility] ENDOSCOPY;  Service: Gastroenterology;  Laterality: N/A;   EYE SURGERY  2003   R&L cataract removed & IOL- 2003 & 2007,Dr.Jenkins   INGUINAL HERNIA REPAIR  2002   right, Dr. Honore   JOINT REPLACEMENT  2012   left knee replacement   LIPOMA EXCISION     back   MENISCECTOMY Bilateral    Dr. Jane   PROSTATE SURGERY  07/2006   for urinary urgency, Dr. Alline   ROTATOR CUFF REPAIR Right 11/2014   sinus & throat surgery-1995  1995   SPINE SURGERY  2005   TONSILLECTOMY     TOTAL KNEE  ARTHROPLASTY  01/11/2011   Procedure: TOTAL KNEE ARTHROPLASTY;  Surgeon: Garnette JONETTA Raman, MD;  Location: [facility] OR;  Service: Orthopedics;  Laterality: Left;   TOTAL KNEE ARTHROPLASTY Right 11/22/2016   Procedure: TOTAL KNEE ARTHROPLASTY;  Surgeon: Raman Kemps, MD;  Location: [facility] OR;  Service: Orthopedics;  Laterality: Right;   UVULECTOMY  1996   Dr. 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      Family: 5 children (one passed away) 2 stepchildren, 14 grandchildren, 9 great-grandchildren - Museum/gallery conservator and granddaughter nearby otherwise [place]       Enjoys: fly and play golf, reading      Exercise: walking the dog   Diet: diabetic diet      Safety   Seat belts: Yes    Guns: Yes  and secure   Safe in relationships: Yes    Immunization History  Administered Date(s) Administered   Fluad Quad(high Dose 65+) 12/05/2019   Fluad Trivalent(High Dose 65+) 12/01/2022   Influenza Whole 01/09/2002   Influenza, High Dose Seasonal PF 11/24/2018, 02/08/2019   Influenza,inj,Quad PF,6+ Mos 11/29/2012, 11/29/2013   Influenza-Unspecified 11/30/2014, 11/22/2015, 01/12/2018, 11/27/2021   MMR 07/12/2017   PFIZER(Purple Top)SARS-COV-2 Vaccination 03/11/2019, 04/01/2019, 11/07/2019, 06/18/2020   Pfizer Covid-19 Vaccine Bivalent Booster 34yrs & up 12/31/2020   Pneumococcal Conjugate-13 03/20/2014   Pneumococcal Polysaccharide-23 03/30/1996, 02/13/2010   Respiratory Syncytial Virus Vaccine,Recomb Aduvanted(Arexvy) 12/15/2021   Td 03/30/1996, 04/26/2007   Td (Adult) 03/30/1996, 04/26/2007   Tdap 07/24/2018   Unspecified SARS-COV-2 Vaccination 11/27/2021   Zoster Recombinant(Shingrix) 07/24/2018, 11/24/2018, 02/08/2019     Objective: Vital Signs: BP 133/66 (BP Location: Right Arm, Patient Position: Sitting, Cuff Size: Normal)   Pulse 63   Resp 16   Ht 5' 10 (1.778 m)   Wt 189 lb 3.2 oz (85.8 kg)   BMI 27.15 kg/m    Physical Exam Vitals and nursing note reviewed.  Constitutional:      Appearance: He is well-developed.  HENT:     Head: Normocephalic and atraumatic.  Eyes:     Conjunctiva/sclera: Conjunctivae normal.     Pupils: Pupils are equal, round, and reactive to light.  Cardiovascular:     Rate and Rhythm: Normal rate and regular rhythm.     Heart sounds: Normal heart sounds.  Pulmonary:     Effort: Pulmonary effort is normal.     Breath sounds: Normal breath sounds.  Abdominal:     General: Bowel sounds are normal.     Palpations: Abdomen is soft.  Musculoskeletal:     Cervical back: Normal range of motion  and neck supple.  Skin:    General: Skin is warm and dry.     Capillary Refill: Capillary refill takes less than 2 seconds.  Neurological:     Mental Status: He is alert and oriented to person, place, and time.  Psychiatric:        Behavior: Behavior normal.      Musculoskeletal Exam: Patient was examined in the seated position.  He had limited  by torsion of the cervical spine.  Marked thoracic kyphosis was noted.  He had no tenderness over thoracic or lumbar spine.  He had good range of motion of both shoulders with discomfort on range of motion of his left shoulder.  Elbows and wrist joints with good range of motion.  There was no MCP PIP or DIP thickening.  Hip joints could not be assessed in the seated position.  Knee joints were in good range of motion.  There was no tenderness over her ankles.  He had bilateral significant pedal edema more prominent in his left lower extremity.  Healing ulcer on the left great toe was noted.  CDAI Exam: CDAI Score: -- Patient Global: --; Provider Global: -- Swollen: --; Tender: -- Joint Exam 09/21/2023   No joint exam has been documented for this visit   There is currently no information documented on the homunculus. Go to the Rheumatology activity and complete the homunculus joint exam.  Investigation: No additional findings.  Imaging: ECHOCARDIOGRAM COMPLETE Result Date: 09/12/2023    ECHOCARDIOGRAM REPORT   Patient Name:   Kyle Simon Date of Exam: 09/12/2023 Medical Rec #:  [id number]      Height:       70.0 in Accession #:    [id number]     Weight:       185.0 lb Date of Birth:  1932/08/01     BSA:          2.019 m Patient Age:    89 years       BP:           118/82 mmHg Patient Gender: M              HR:           55 bpm. Exam Location:  [address] Procedure: 2D Echo, Cardiac Doppler and Color Doppler (Both Spectral and Color            Flow Doppler were utilized during procedure). Indications:    R60.0 Lower extremity edema  History:         Patient has prior history of Echocardiogram examinations, most                 recent 08/20/2010. Risk Factors:Hypertension, Dyslipidemia and                 Diabetes.  Sonographer:    Carl Rodgers-Jones RDCS Referring Phys: [id number] JAMES HOCHREIN IMPRESSIONS  1. Left ventricular ejection fraction, by estimation, is 55 to 60%. The left ventricle has normal function. The left ventricle has no regional wall motion abnormalities. Left ventricular diastolic function could not be evaluated.  2. Right ventricular systolic function is normal. The right ventricular size is normal.  3. Left atrial size was mildly dilated.  4. The mitral valve is normal in structure. No evidence of mitral valve regurgitation. No evidence of mitral stenosis.  5. The aortic valve is normal in structure. There is moderate calcification of the aortic valve. Aortic valve regurgitation is mild. No aortic stenosis is present. Aortic valve area, by VTI measures 1.39 cm. Aortic valve mean gradient measures 10.0 mmHg. Aortic valve Vmax measures 2.21 m/s.  6. Aortic dilatation noted. There is borderline dilatation of the ascending aorta, measuring 38 mm.  7. The inferior vena cava is normal in size with greater than 50% respiratory variability, suggesting right atrial pressure of 3 mmHg. FINDINGS  Left Ventricle: Left ventricular ejection fraction, by estimation, is 55 to 60%.  The left ventricle has normal function. The left ventricle has no regional wall motion abnormalities. The left ventricular internal cavity size was normal in size. There is  no left ventricular hypertrophy. Left ventricular diastolic function could not be evaluated due to atrial fibrillation. Left ventricular diastolic function could not be evaluated. Right Ventricle: The right ventricular size is normal. No increase in right ventricular wall thickness. Right ventricular systolic function is normal. Left Atrium: Left atrial size was mildly dilated. Right Atrium: Right atrial  size was normal in size. Pericardium: There is no evidence of pericardial effusion. Mitral Valve: The mitral valve is normal in structure. No evidence of mitral valve regurgitation. No evidence of mitral valve stenosis. Tricuspid Valve: The tricuspid valve is normal in structure. Tricuspid valve regurgitation is not demonstrated. No evidence of tricuspid stenosis. The aortic valve is normal in structure. There is moderate calcification of the aortic valve. Aortic valve regurgitation is mild. No aortic stenosis is present. Pulmonic Valve: The pulmonic valve was normal in structure. Pulmonic valve regurgitation is not visualized. No evidence of pulmonic stenosis. Aorta: Aortic dilatation noted. There is borderline dilatation of the ascending aorta, measuring 38 mm. Venous: The inferior vena cava is normal in size with greater than 50% respiratory variability, suggesting right atrial pressure of 3 mmHg. IAS/Shunts: No atrial level shunt detected by color flow Doppler.  LEFT VENTRICLE PLAX 2D LVIDd:         4.80 cm   Diastology LVIDs:         3.50 cm   LV e' medial:    6.52 cm/s LV PW:         0.80 cm   LV E/e' medial:  11.2 LV IVS:        0.80 cm   LV e' lateral:   7.18 cm/s LVOT diam:     1.90 cm   LV E/e' lateral: 10.2 LV SV:         69 LV SV Index:   34 LVOT Area:     2.84 cm  RIGHT VENTRICLE RV Basal diam:  4.70 cm RV S prime:     13.55 cm/s TAPSE (M-mode): 2.6 cm LEFT ATRIUM             Index        RIGHT ATRIUM           Index LA diam:        3.90 cm 1.93 cm/m   RA Area:     18.50 cm LA Vol (A2C):   81.8 ml 40.51 ml/m  RA Volume:   54.40 ml  26.94 ml/m LA Vol (A4C):   70.4 ml 34.86 ml/m LA Biplane Vol: 76.6 ml 37.93 ml/m  AORTIC VALVE AV Area (Vmax):    1.28 cm AV Area (Vmean):   1.31 cm AV Area (VTI):     1.39 cm AV Vmax:           221.00 cm/s AV Vmean:          139.000 cm/s AV VTI:            0.496 m AV Peak Grad:      19.5 mmHg AV Mean Grad:      10.0 mmHg LVOT Vmax:         100.00 cm/s LVOT Vmean:         64.033 cm/s LVOT VTI:          0.243 m LVOT/AV VTI ratio: 0.49 AI PHT:  823 msec  AORTA Ao Root diam: 3.70 cm Ao Asc diam:  3.80 cm MITRAL VALVE               TRICUSPID VALVE MV Area (PHT): 2.91 cm    TR Peak grad:   26.4 mmHg MV Decel Time: 261 msec    TR Vmax:        257.00 cm/s MV E velocity: 73.40 cm/s MV A velocity: 81.80 cm/s  SHUNTS MV E/A ratio:  0.90        Systemic VTI:  0.24 m                            Systemic Diam: 1.90 cm Aditya Sabharwal Electronically signed by Ria Commander Signature Date/Time: 09/12/2023/1:28:17 PM    Final     Recent Labs: Lab Results  Component Value Date   WBC 7.2 12/27/2022   HGB 11.6 (L) 12/27/2022   PLT 305.0 12/27/2022   NA 130 (L) 07/18/2023   K 5.0 07/18/2023   CL 98 07/18/2023   CO2 27 07/18/2023   GLUCOSE 110 (H) 07/18/2023   BUN 16 07/18/2023   CREATININE 1.00 07/18/2023   BILITOT 0.7 11/05/2022   ALKPHOS 64 11/05/2022   AST 17 11/05/2022   ALT 14 11/05/2022   PROT 6.3 (L) 11/05/2022   ALBUMIN 3.5 11/05/2022   CALCIUM  8.7 07/18/2023   GFRAA 85 03/21/2019    Speciality Comments: No specialty comments available.  Procedures:  No procedures performed Allergies: Simvastatin and Oxycodone    Assessment / Plan:     Visit Diagnoses: Chronic right shoulder pain-patient states he had cortisone injection in Dr. Conrado office about 6 months ago.  He noticed some improvement in range of motion but continues to have some discomfort.  He went to physical therapy and then discontinued.  Range of active motion exercises were demonstrated in the office and a handout on exercises was given.  H/O total knee replacement, bilateral - He had right total knee replacement 11/22/2016 by Dr. Dorian, left total knee replacement 2012 by Dr. Dorian.  Doing well.  A handout on lower extremity muscle strengthening exercises were discussed.  A handout was placed in the AVS.  Open wound of left foot excluding one or more toes, subsequent encounter-he  has been going to Dr. Tobie.  The ulcer on the left great toe has been healing.  Dr. Tobie applied amino max graft.  Chronic pain of right ankle - Followed by Dr. Tobie.  He has noticed improvement in the right ankle pain.  Postural kyphosis of thoracic region-he had no tenderness on the examination.  History of lumbar fusion - By Dr. Unice  Other chronic pain-frequent history of chronic discomfort in multiple joints.  He is on tramadol , hydrocodone , gabapentin  and Celebrex .  I discouraged the use of Celebrex .  Patient requested a permanent handicap placard which was also filled.  History of recent fall - September 2024 pubic rami fracture.  He went to physical therapy.  Age-related osteoporosis without current pathological fracture - DEXA 03/18/22  the BMD measured at Femur Neck Left is 0.775 g/cm2 with a T-score of-2.3.Left Forearm Radius 33% 03/18/2022 87.0 Osteopenia -2.3 0.758 g/cm2.  Very detailed discussion regarding different treatment options at the last visit.  I again discussed the treatment options for osteoporosis but patient declined.  Need for regular exercise and muscles strength training was discussed.  Calcium  rich diet and vitamin D  intake was also discussed.  Patient will contact us  when he is ready to start on the treatment for osteoporosis.  Other medical problems are listed as follows:  Aortic atherosclerosis (HCC)  History of hypertension  History of diabetes mellitus  History of hyperlipidemia  History of gastroesophageal reflux (GERD)  Diverticulosis of colon without hemorrhage  History of anemia  Orders: No orders of the defined types were placed in this encounter.  No orders of the defined types were placed in this encounter.    Follow-Up Instructions: Return in about 1 year (around 09/20/2024) for Osteoarthritis.   Maya Nash, MD  Note - This record has been created using Animal nutritionist.  Chart creation errors have been sought, but may not  always  have been located. Such creation errors do not reflect on  the standard of medical care.

## 2023-09-08 ENCOUNTER — Ambulatory Visit (INDEPENDENT_AMBULATORY_CARE_PROVIDER_SITE_OTHER): Admitting: Podiatry

## 2023-09-08 DIAGNOSIS — L97522 Non-pressure chronic ulcer of other part of left foot with fat layer exposed: Secondary | ICD-10-CM | POA: Diagnosis not present

## 2023-09-08 NOTE — Progress Notes (Signed)
 Application of Amnio-Maxx Graft: -arterial studies and/or Vascular intervention: ABIs PVR shows triphasic flow to both lower extremity -Patient has severe morbidity: Diabetes -Patient was also nutritionally optimized and encouraged protein rich diet -Smoking cessation was discussed in extensive detail however patient does not currently smoke  -Wound start date: 06/23/2023 -Interventions attempted: Betadine wet to dry/local wound care, offloading, immobilization  -Decision was made to use Amniotic graft since partial / full thickness skin grafts such as Theraskin can lead to scarring and wound recurrence. The clinic did not use any synthetic grafts due to higher chance of foreign body reactions /rejection. Cryo-preserved amniotic graft was not used since storage infrastructure is not available at this outpatient clinic and the patient is unwilling to go to a wound care center. Grafts requiring upfront purchase were not considered due to financial limitations of this outpatient clinic and thus only grafts available on consignment were considered. Amionitic was chosen based on various characteristics including but not limited to scaffolding properties, heavy chain hyaluronic acid, cytokines, and grown factors to expedite wound healing.  Graft application: #5 -The wound was cleansed with saline. Selective debridement techniques to remove devitalized tissue was performed through subcutaneous tissue with 15 blade until viable bleeding tissue was encountered. -Topical application of Amnio-Max graft was placed over the wound and dressed with adaptic non-adherent, gauze, kling and ACE bandage. -Product Code: AMN-22 -Expiration: 07/14/2028 -Lot# [id number]  Procedure: Excisional Debridement of Wound Tool: Sharp chisel blade/tissue nipper Rationale: Removal of non-viable soft tissue from the wound to promote healing.  Anesthesia: none Pre-Debridement Wound Measurements: 0.7 cm x 0.5 cm x 0.3 cm   Post-Debridement Wound Measurements: 0.7 cm x 0.5 cm x 0.3 cm  Type of Debridement: Sharp Excisional Tissue Removed: Non-viable soft tissue Blood loss: Minimal (<50cc) Depth of Debridement: subcutaneous tissue. Technique: Sharp excisional debridement to bleeding, viable wound base.  Wound Progress: Wound decreased slightly more granulation tissue noted  Dressing: Dry, sterile, compression dressing. Disposition: Patient tolerated procedure well. Patient to return in 1 week for follow-up.    -The entire graft was utilized and non discarded. Graft was used in wound described above. I folded the graft in order to maximize amount of product used for the wound and maximize benefit from scaffold properties of the amniotic graft.

## 2023-09-09 ENCOUNTER — Ambulatory Visit (INDEPENDENT_AMBULATORY_CARE_PROVIDER_SITE_OTHER): Payer: Medicare Other | Admitting: Family

## 2023-09-09 ENCOUNTER — Encounter: Payer: Self-pay | Admitting: Family

## 2023-09-09 VITALS — BP 118/82 | HR 68 | Temp 98.3°F | Ht 70.0 in | Wt 185.0 lb

## 2023-09-09 DIAGNOSIS — R053 Chronic cough: Secondary | ICD-10-CM | POA: Diagnosis not present

## 2023-09-09 DIAGNOSIS — M6281 Muscle weakness (generalized): Secondary | ICD-10-CM | POA: Diagnosis not present

## 2023-09-09 DIAGNOSIS — R2681 Unsteadiness on feet: Secondary | ICD-10-CM | POA: Diagnosis not present

## 2023-09-09 DIAGNOSIS — R052 Subacute cough: Secondary | ICD-10-CM | POA: Insufficient documentation

## 2023-09-09 MED ORDER — OMEPRAZOLE 20 MG PO CPDR: 20.0000 mg | DELAYED_RELEASE_CAPSULE | Freq: Every day | ORAL | 0 refills | Status: DC

## 2023-09-09 MED ORDER — FEXOFENADINE HCL 180 MG PO TABS: 180.0000 mg | ORAL_TABLET | Freq: Every day | ORAL | 0 refills | Status: DC

## 2023-09-09 NOTE — Patient Instructions (Signed)
  Start some over the counter allegra  (allergy medication) and also a TWO week trial of prilosec.

## 2023-09-09 NOTE — Assessment & Plan Note (Signed)
 Allergies vs heartburn  Trial omeprazole  20 mg once daily x 2 weeks  Trial allegra  180 mg once daily otc  If no improvement please f/u

## 2023-09-09 NOTE — Progress Notes (Signed)
 Established Patient Office Visit  Subjective:      CC:  Chief Complaint  Patient presents with   Cough    Really bad at night x 2-3 weeks; very dry cough. Has taken some otc cough medication at times and has helped some. Patient gets woken up with the cough.     HPI: Kyle Simon is a 89 y.o. male presenting on 09/09/2023 for Cough (Really bad at night x 2-3 weeks; very dry cough. Has taken some otc cough medication at times and has helped some. Patient gets woken up with the cough. )  Strated with a dry cough about 2-3 weeks ago and it keeps him at night time. He states he doesn't feel 'sick' necessary. Nothing to 'bring up' very dry cough. No wheezing. He denies heart burn. Most of the coughing is only at night time but occasional and rarely in the day time. No allergy type symptoms, no itchy watery eyes. He is on valsartan  but has been on this for some time. No sob or doe out of the ordinary   Did take some otc cough medication with some relief of symptoms.   Was treated about one month ago for elevated BNP and Doe/SOB  Started on furosemide  with improvement of shortness of breath.   Left foot ulcer, followed by podiatry. Started prophylactically by podiatry with doxycycline .    Social history:  Relevant past medical, surgical, family and social history reviewed and updated as indicated. Interim medical history since our last visit reviewed.  Allergies and medications reviewed and updated.  DATA REVIEWED: CHART IN EPIC     ROS: Negative unless specifically indicated above in HPI.    Current Outpatient Medications:    celecoxib  (CELEBREX ) 100 MG capsule, Take 100 mg by mouth 2 (two) times daily., Disp: , Rfl:    diclofenac  Sodium (VOLTAREN ) 1 % GEL, Apply 2 g topically at bedtime., Disp: 100 g, Rfl: 2   doxycycline  (VIBRA -TABS) 100 MG tablet, Take 1 tablet (100 mg total) by mouth 2 (two) times daily., Disp: 20 tablet, Rfl: 0   fexofenadine  (ALLEGRA  ALLERGY) 180 MG  tablet, Take 1 tablet (180 mg total) by mouth daily., Disp: 90 tablet, Rfl: 0   furosemide  (LASIX ) 20 MG tablet, TAKE 1 TABLET (20 MG TOTAL) BY MOUTH DAILY. TAKE 1 TABLET (20 MG TOTAL) BY MOUTH DAILY FOR 7 DAYS., Disp: 90 tablet, Rfl: 2   HYDROcodone -acetaminophen  (NORCO/VICODIN) 5-325 MG tablet, Take 1 tablet by mouth every 6 (six) hours as needed for moderate pain (pain score 4-6)., Disp: 30 tablet, Rfl: 0   Hypromellose (ARTIFICIAL TEARS OP), Place 1 drop into both eyes 2 (two) times daily., Disp: , Rfl:    Multiple Vitamin (MULTIVITAMIN PO), Take by mouth daily., Disp: , Rfl:    Omega-3 Fatty Acids (FISH OIL) 1000 MG CAPS, Take 1,000 mg by mouth every evening., Disp: , Rfl:    omeprazole  (PRILOSEC) 20 MG capsule, Take 1 capsule (20 mg total) by mouth daily., Disp: 30 capsule, Rfl: 0   rosuvastatin  (CRESTOR ) 5 MG tablet, Take 1 tablet (5 mg total) by mouth at bedtime., Disp: 7 tablet, Rfl: 0   SYNTHROID  137 MCG tablet, TAKE 1 TABLET DAILY BEFORE BREAKFAST, Disp: 90 tablet, Rfl: 3   tamsulosin  (FLOMAX ) 0.4 MG CAPS capsule, TAKE 1 CAPSULE TWICE A DAY, Disp: 180 capsule, Rfl: 3   traMADol  (ULTRAM ) 50 MG tablet, Take 1 tablet (50 mg total) by mouth every 8 (eight) hours as needed for moderate pain (  pain score 4-6)., Disp: 20 tablet, Rfl: 0   valsartan  (DIOVAN ) 320 MG tablet, TAKE 1 TABLET BY MOUTH EVERY DAY, Disp: 90 tablet, Rfl: 3      Objective:    BP 118/82   Pulse 68   Temp 98.3 F (36.8 C) (Temporal)   Ht 5' 10 (1.778 m)   Wt 185 lb (83.9 kg)   BMI 26.54 kg/m   Wt Readings from Last 3 Encounters:  09/09/23 185 lb (83.9 kg)  07/29/23 187 lb 9.6 oz (85.1 kg)  07/21/23 189 lb 3.2 oz (85.8 kg)    Physical Exam Vitals reviewed.  Constitutional:      General: He is not in acute distress.    Appearance: Normal appearance. He is normal weight. He is not ill-appearing, toxic-appearing or diaphoretic.  HENT:     Head: Normocephalic.     Right Ear: Tympanic membrane normal.     Left  Ear: A middle ear effusion is present.     Nose: Nose normal.     Mouth/Throat:     Mouth: Mucous membranes are moist.     Pharynx: Postnasal drip present.     Tonsils: No tonsillar exudate.  Eyes:     Pupils: Pupils are equal, round, and reactive to light.  Cardiovascular:     Rate and Rhythm: Normal rate and regular rhythm.  Pulmonary:     Effort: Pulmonary effort is normal.     Breath sounds: Normal breath sounds. No wheezing.  Musculoskeletal:        General: Normal range of motion.     Cervical back: Normal range of motion.     Right lower leg: No edema.     Left lower leg: 1+ Edema present.  Neurological:     General: No focal deficit present.     Mental Status: He is alert and oriented to person, place, and time. Mental status is at baseline.  Psychiatric:        Mood and Affect: Mood normal.        Behavior: Behavior normal.        Thought Content: Thought content normal.        Judgment: Judgment normal.           Assessment & Plan:  Persistent dry cough Assessment & Plan: Allergies vs heartburn  Trial omeprazole  20 mg once daily x 2 weeks  Trial allegra  180 mg once daily otc  If no improvement please f/u  Orders: -     Omeprazole ; Take 1 capsule (20 mg total) by mouth daily.  Dispense: 30 capsule; Refill: 0 -     Fexofenadine  HCl; Take 1 tablet (180 mg total) by mouth daily.  Dispense: 90 tablet; Refill: 0     Return in about 6 months (around 03/11/2024) for f/u CPE.  Ginger Patrick, MSN, APRN, FNP-C [facility]

## 2023-09-12 ENCOUNTER — Ambulatory Visit (HOSPITAL_COMMUNITY)
Admission: RE | Admit: 2023-09-12 | Discharge: 2023-09-12 | Disposition: A | Discharge status: Home / Self Care | Source: Ambulatory Visit | Attending: Internal Medicine | Admitting: Internal Medicine

## 2023-09-12 DIAGNOSIS — R600 Localized edema: Secondary | ICD-10-CM | POA: Diagnosis present

## 2023-09-12 LAB — ECHOCARDIOGRAM COMPLETE
AR max vel: 1.28 cm2
AV Area VTI: 1.39 cm2
AV Area mean vel: 1.31 cm2
AV Mean grad: 10 mmHg
AV Peak grad: 19.5 mmHg
Ao pk vel: 2.21 m/s
Area-P 1/2: 2.91 cm2
P 1/2 time: 823 ms
S' Lateral: 3.5 cm

## 2023-09-13 ENCOUNTER — Ambulatory Visit (INDEPENDENT_AMBULATORY_CARE_PROVIDER_SITE_OTHER): Payer: Medicare Other

## 2023-09-13 VITALS — Ht 70.0 in | Wt 181.0 lb

## 2023-09-13 DIAGNOSIS — Z0000 Encounter for general adult medical examination without abnormal findings: Secondary | ICD-10-CM

## 2023-09-13 NOTE — Patient Instructions (Addendum)
 [name] , Thank you for taking time out of your busy schedule to complete your Annual Wellness Visit with me. I enjoyed our conversation and look forward to speaking with you again next year. I, as well as your care team,  appreciate your ongoing commitment to your health goals. Please review the following plan we discussed and let me know if I can assist you in the future. Your Game plan/ To Do List     Follow up Visits: Next Medicare AWV with our clinical staff: 09/13/24 @ 8:10am televisit   Have you seen your provider in the last 6 months (3 months if uncontrolled diabetes)? Yes Next Office Visit with your provider: not scheduled ; declined to schedule at this time DTaP says his wife will schedule CPE in December.  Clinician Recommendations:  Aim for 30 minutes of exercise or brisk walking, 6-8 glasses of water , and 5 servings of fruits and vegetables each day. no      This is a list of the screening recommended for you and due dates:  Health Maintenance  Topic Date Due   COVID-19 Vaccine (7 - 2024-25 season) 10/31/2022   Flu Shot  09/30/2023   Complete foot exam   10/07/2023   Hemoglobin A1C  01/18/2024   Eye exam for diabetics  07/31/2024   Medicare Annual Wellness Visit  09/12/2024   DTaP/Tdap/Td vaccine (6 - Td or Tdap) 07/23/2028   Pneumococcal Vaccine for age over 49  Completed   Zoster (Shingles) Vaccine  Completed   Hepatitis B Vaccine  Aged Out   HPV Vaccine  Aged Out   Meningitis B Vaccine  Aged Out   Colon Cancer Screening  Discontinued    Advanced directives: (Copy Requested) Please bring a copy of your health care power of attorney and living will to the office to be added to your chart at your convenience. You can mail to [facility] [address] [place], [place] [address] or email to [email or web address] Advance Care Planning is important because it:  [x]  Makes sure you receive the medical care that is consistent with your values, goals,  and preferences  [x]  It provides guidance to your family and loved ones and reduces their decisional burden about whether or not they are making the right decisions based on your wishes.  Follow the link provided in your after visit summary or read over the paperwork we have mailed to you to help you started getting your Advance Directives in place. If you need assistance in completing these, please reach out to us  so that we can help you!

## 2023-09-13 NOTE — Progress Notes (Signed)
 Subjective:   Kyle Simon is a 89 y.o. who presents for a Medicare Wellness preventive visit.  As a reminder, Annual Wellness Visits don't include a physical exam, and some assessments may be limited, especially if this visit is performed virtually. We may recommend an in-person follow-up visit with your provider if needed.  Visit Complete: Virtual I connected with  Kyle Simon Guardian on 09/13/23 by a audio enabled telemedicine application and verified that I am speaking with the correct person using two identifiers.  Patient Location: Home  Provider Location: Office/Clinic  I discussed the limitations of evaluation and management by telemedicine. The patient expressed understanding and agreed to proceed.  Vital Signs: Because this visit was a virtual/telehealth visit, some criteria may be missing or patient reported. Any vitals not documented were not able to be obtained and vitals that have been documented are patient reported.  VideoDeclined- This patient declined Librarian, academic. Therefore the visit was completed with audio only.  Persons Participating in Visit: Patient.  AWV Questionnaire: No: Patient Medicare AWV questionnaire was not completed prior to this visit.  Cardiac Risk Factors include: advanced age (>20men, >65 women);diabetes mellitus;dyslipidemia;male gender;hypertension     Objective:    Today's Vitals   09/13/23 0851  Weight: 181 lb (82.1 kg)  Height: 5' 10 (1.778 m)   Body mass index is 25.97 kg/m.     09/13/2023    9:09 AM 11/10/2022   11:42 AM 11/04/2022    1:34 PM 09/09/2022    9:01 AM 05/06/2022    8:40 AM 09/07/2021    8:56 AM 09/06/2020    1:43 PM  Advanced Directives  Does Patient Have a Medical Advance Directive? Yes Yes No Yes Yes No Yes  Type of Estate agent of [place];Living will Healthcare Power of [place];Living will;Out of facility DNR (pink MOST or yellow form)  Healthcare Power of  [place];Living will   Healthcare Power of [place];Living will;Out of facility DNR (pink MOST or yellow form)  Does patient want to make changes to medical advance directive?  No - Patient declined     No - Patient declined  Copy of Healthcare Power of Attorney in Chart? No - copy requested Yes - validated most recent copy scanned in chart (See row information)  No - copy requested   No - copy requested  Would patient like information on creating a medical advance directive?   No - Patient declined   No - Patient declined     Current Medications (verified) Outpatient Encounter Medications as of 09/13/2023  Medication Sig   celecoxib  (CELEBREX ) 100 MG capsule Take 100 mg by mouth 2 (two) times daily.   diclofenac  Sodium (VOLTAREN ) 1 % GEL Apply 2 g topically at bedtime.   doxycycline  (VIBRA -TABS) 100 MG tablet Take 1 tablet (100 mg total) by mouth 2 (two) times daily.   fexofenadine  (ALLEGRA  ALLERGY) 180 MG tablet Take 1 tablet (180 mg total) by mouth daily.   furosemide  (LASIX ) 20 MG tablet TAKE 1 TABLET (20 MG TOTAL) BY MOUTH DAILY. TAKE 1 TABLET (20 MG TOTAL) BY MOUTH DAILY FOR 7 DAYS.   HYDROcodone -acetaminophen  (NORCO/VICODIN) 5-325 MG tablet Take 1 tablet by mouth every 6 (six) hours as needed for moderate pain (pain score 4-6).   Hypromellose (ARTIFICIAL TEARS OP) Place 1 drop into both eyes 2 (two) times daily.   Multiple Vitamin (MULTIVITAMIN PO) Take by mouth daily.   Omega-3 Fatty Acids (FISH OIL) 1000 MG CAPS Take 1,000  mg by mouth every evening.   omeprazole  (PRILOSEC) 20 MG capsule Take 1 capsule (20 mg total) by mouth daily.   rosuvastatin  (CRESTOR ) 5 MG tablet Take 1 tablet (5 mg total) by mouth at bedtime.   SYNTHROID  137 MCG tablet TAKE 1 TABLET DAILY BEFORE BREAKFAST   tamsulosin  (FLOMAX ) 0.4 MG CAPS capsule TAKE 1 CAPSULE TWICE A DAY   traMADol  (ULTRAM ) 50 MG tablet Take 1 tablet (50 mg total) by mouth every 8 (eight) hours as needed for moderate pain (pain score 4-6).    valsartan  (DIOVAN ) 320 MG tablet TAKE 1 TABLET BY MOUTH EVERY DAY   No facility-administered encounter medications on file as of 09/13/2023.    Allergies (verified) Simvastatin and Oxycodone    History: Past Medical History:  Diagnosis Date   Arthritis    both hands;Dr.Deveshwar   Cataract 2010   Constipation due to pain medication    Diabetes mellitus without complication (HCC)    Type II. Uses no medication. Pt controls with diet and weight   Enlarged prostate    Frequent urination at night    GERD (gastroesophageal reflux disease)    History of noncompliance with medical treatment, presenting hazards to health 11/20/2018   Hyperlipidemia associated with type 2 diabetes mellitus (HCC) 04/19/2007   Qualifier: Diagnosis of  By: Tish MD, William     Hypertension    Hypothyroidism    Polio    as a child w/o complications   Snoring    no sleep apnea   Statin declined-  11/20/2018   patient understands risks associated with this decision and has been advised to restart by cardiologist as well as myself several times   Torn meniscus    Transfusion history 2012   post TKR   Past Surgical History:  Procedure Laterality Date   ANTERIOR LAT LUMBAR FUSION Right 11/03/2017   Procedure: Right Lumbar three-four Anterolateral lumbar interbody fusion with lateral plate;  Surgeon: Unice Pac, MD;  Location: [facility] OR;  Service: Neurosurgery;  Laterality: Right;   BACK SURGERY  2011   hemiarthroplasty 01/11/1999 and 6   CARDIAC CATHETERIZATION  09/2001   negative   COLONOSCOPY     COLONOSCOPY N/A 05/06/2022   Procedure: COLONOSCOPY;  Surgeon: Unk Corinn Skiff, MD;  Location: [facility] ENDOSCOPY;  Service: Gastroenterology;  Laterality: N/A;   COLONOSCOPY WITH PROPOFOL  N/A 05/05/2022   Procedure: COLONOSCOPY WITH PROPOFOL ;  Surgeon: Unk Corinn Skiff, MD;  Location: [facility] ENDOSCOPY;  Service: Gastroenterology;  Laterality: N/A;   EYE SURGERY  2003   R&L cataract removed & IOL- 2003 &  2007,Dr.Jenkins   INGUINAL HERNIA REPAIR  2002   right, Dr. Honore   JOINT REPLACEMENT  2012   left knee replacement   LIPOMA EXCISION     back   MENISCECTOMY Bilateral    Dr. Jane   PROSTATE SURGERY  07/2006   for urinary urgency, Dr. Alline FRIED CUFF REPAIR Right 11/2014   sinus & throat surgery-1995  1995   SPINE SURGERY  2005   TONSILLECTOMY     TOTAL KNEE ARTHROPLASTY  01/11/2011   Procedure: TOTAL KNEE ARTHROPLASTY;  Surgeon: Garnette JONETTA Raman, MD;  Location: [facility] OR;  Service: Orthopedics;  Laterality: Left;   TOTAL KNEE ARTHROPLASTY Right 11/22/2016   Procedure: TOTAL KNEE ARTHROPLASTY;  Surgeon: Raman Kemps, MD;  Location: [facility] OR;  Service: Orthopedics;  Laterality: Right;   UVULECTOMY  1996   Dr. Arlana   VASECTOMY     WRIST SURGERY  [phone]  fracture- right   XI ROBOTIC ASSISTED VENTRAL HERNIA N/A 09/07/2020   Procedure: XI ROBOTIC ASSISTED DIAGNOSTIC LAPAROSCOPY;  Surgeon: Jordis Laneta FALCON, MD;  Location: [facility] ORS;  Service: General;  Laterality: N/A;   Family History  Problem Relation Age of Onset   Coronary artery disease Mother    Emphysema Mother    Diverticulosis Mother    Thyroid  disease Mother    Sudden death Father        accident   Asthma Brother    Bone cancer Brother    Diabetes Paternal Grandmother    Thyroid  disease Daughter    Sarcoidosis Son    Anesthesia problems Neg Hx    Hypotension Neg Hx    Malignant hyperthermia Neg Hx    Pseudochol deficiency Neg Hx    Colon cancer Neg Hx    Social History   Socioeconomic History   Marital status: Married    Spouse name: June   Number of children: 7   Years of education: 2 years college   Highest education level: Not on file  Occupational History   Occupation: retired  Tobacco Use   Smoking status: Former    Current packs/day: 0.00    Average packs/day: 1 pack/day for 18.0 years (18.0 ttl pk-yrs)    Types: Cigarettes    Start date: 03/01/1948    Quit date: 03/01/1966    Years since  quitting: 57.5    Passive exposure: Never   Smokeless tobacco: Never  Vaping Use   Vaping status: Never Used  Substance and Sexual Activity   Alcohol  use: Not Currently    Comment: social, maybe 1 drink weekly   Drug use: No   Sexual activity: Yes    Birth control/protection: Post-menopausal, Surgical  Other Topics Concern   Not on file  Social History Narrative   09/20/19   From: [place]iginally, [place]   Living: with wife June (1985)   Work: retired from Company secretary, 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      Family: 5 children (one passed away) 2 stepchildren, 14 grandchildren, 9 great-grandchildren - Museum/gallery conservator and granddaughter nearby otherwise across the country       Enjoys: fly and play golf, reading      Exercise: walking the dog   Diet: diabetic diet      Safety   Seat belts: Yes    Guns: Yes  and secure   Safe in relationships: Yes    Social Drivers of Corporate investment banker Strain: Low Risk  (09/13/2023)   Overall Financial Resource Strain (CARDIA)    Difficulty of Paying Living Expenses: Not hard at all  Food Insecurity: No Food Insecurity (09/13/2023)   Hunger Vital Sign    Worried About Running Out of Food in the Last Year: Never true    Ran Out of Food in the Last Year: Never true  Transportation Needs: No Transportation Needs (09/13/2023)   PRAPARE - Administrator, Civil Service (Medical): No    Lack of Transportation (Non-Medical): No  Physical Activity: Insufficiently Active (09/13/2023)   Exercise Vital Sign    Days of Exercise per Week: 2 days    Minutes of Exercise per Session: 60 min  Stress: No Stress Concern Present (09/13/2023)   [employer] of Occupational Health - Occupational Stress Questionnaire    Feeling of Stress: Not at all  Social Connections: Socially Integrated (09/13/2023)   Social Connection and Isolation Panel    Frequency of Communication  with Friends and Family: More than three times a week     Frequency of Social Gatherings with Friends and Family: Once a week    Attends Religious Services: More than 4 times per year    Active Member of [employer] or Organizations: Yes    Attends Banker Meetings: Never    Marital Status: Married    Tobacco Counseling Counseling given: Not Answered    Clinical Intake:  Pre-visit preparation completed: Yes  Pain : No/denies pain     BMI - recorded: 26.54 Nutritional Status: BMI 25 -29 Overweight Nutritional Risks: None Diabetes: Yes CBG done?: No Did pt. bring in CBG monitor from home?: No  Lab Results  Component Value Date   HGBA1C 6.9 (H) 07/18/2023   HGBA1C 7.1 (H) 03/09/2023   HGBA1C 6.4 (A) 09/30/2022     How often do you need to have someone help you when you read instructions, pamphlets, or other written materials from your doctor or pharmacy?: 1 - Never  Interpreter Needed?: No  Comments: lives with wife Information entered by :: B.Charlesia Canaday,LPN   Activities of Daily Living     09/13/2023    9:10 AM 11/05/2022    7:38 AM  In your present state of health, do you have any difficulty performing the following activities:  Hearing? 1 0  Vision? 0 0  Difficulty concentrating or making decisions? 0 0  Walking or climbing stairs? 1 0  Dressing or bathing? 0 1  Doing errands, shopping? 1 0  Comment wife drives while tx for lft great toe   Preparing Food and eating ? N   Using the Toilet? N   In the past six months, have you accidently leaked urine? N   Do you have problems with loss of bowel control? N   Managing your Medications? N   Managing your Finances? N   Housekeeping or managing your Housekeeping? N     Patient Care Team: Corwin Antu, FNP as PCP - General (Family Medicine) Lavona Agent, MD as PCP - Cardiology (Cardiology) Dolphus Reiter, MD as Consulting Physician (Rheumatology) Alvia Norleen BIRCH, MD as Consulting Physician (Ophthalmology) Devere Lonni Righter, MD as Consulting  Physician (Urology) [facility] Tobie Franky SQUIBB, DPM as Consulting Physician (Podiatry)  I have updated your Care Teams any recent Medical Services you may have received from other providers in the past year.     Assessment:   This is a routine wellness examination for Kharter.  Hearing/Vision screen Hearing Screening - Comments:: Pt says says he will get hearing aids in near future: had hearing test Vision Screening - Comments:: Pt says his vision is 20/20 Dr Norleen Alvia   Goals Addressed             This Visit's Progress    COMPLETED: DIET - EAT MORE FRUITS AND VEGETABLES   On track    COMPLETED: Patient Stated       09/04/2020, I will continue to walk 3-4 days a week for about 20 minutes.      Patient Stated   On track    09/13/23-Be more active and build strength in legs.       Depression Screen     09/13/2023    9:04 AM 09/09/2023    9:48 AM 07/13/2023   11:55 AM 12/27/2022   11:33 AM 09/09/2022    8:58 AM 08/17/2022    8:09 AM 07/08/2022   12:00 PM  PHQ 2/9 Scores  PHQ -  2 Score 0 1 0 1 0 1   PHQ- 9 Score  3 3 6  0 6   Exception Documentation       Patient refusal    Fall Risk     09/13/2023    8:57 AM 09/09/2023    9:47 AM 07/13/2023   11:55 AM 12/27/2022   11:33 AM 09/09/2022    8:52 AM  Fall Risk   Falls in the past year? 1 1 1 1 1   Comment Sept24 broke bones and pelvis:5day hosp stay and 3 wks rehab      Number falls in past yr: 0 1 1 1 1   Comment     lost balance on step  Injury with Fall? 0 1 1 1 1   Comment     Hit head  Risk for fall due to : Impaired mobility;Impaired balance/gait History of fall(s);Impaired mobility History of fall(s);Impaired mobility History of fall(s) Impaired balance/gait;Impaired mobility  Follow up Education provided;Falls prevention discussed Falls evaluation completed Falls evaluation completed Falls evaluation completed Falls evaluation completed;Education provided;Falls prevention discussed    MEDICARE  RISK AT HOME:  Medicare Risk at Home Any stairs in or around the home?: Yes If so, are there any without handrails?: Yes Home free of loose throw rugs in walkways, pet beds, electrical cords, etc?: Yes Adequate lighting in your home to reduce risk of falls?: Yes Life alert?: Yes (necklace) Use of a cane, walker or w/c?: Yes Grab bars in the bathroom?: Yes Shower chair or bench in shower?: Yes Elevated toilet seat or a handicapped toilet?: Yes  TIMED UP AND GO:  Was the test performed?  No  Cognitive Function: 6CIT completed    09/04/2020    8:46 AM  MMSE - Mini Mental State Exam  Orientation to time 5  Orientation to Place 5  Registration 3  Attention/ Calculation 5  Recall 3  Language- repeat 1        09/13/2023    9:16 AM 09/09/2022    9:03 AM 11/26/2021   10:17 AM 07/04/2018    9:45 AM 05/18/2017    1:35 PM  6CIT Screen  What Year? 0 points 0 points 0 points 0 points 0 points  What month? 0 points 0 points 0 points 0 points 0 points  What time? 0 points 0 points 0 points 0 points 0 points  Count back from 20 0 points 0 points 0 points 0 points 0 points  Months in reverse 0 points 0 points 0 points 0 points 0 points  Repeat phrase 0 points 0 points 4 points 0 points 0 points  Total Score 0 points 0 points 4 points 0 points 0 points    Immunizations Immunization History  Administered Date(s) Administered   Fluad Quad(high Dose 65+) 12/05/2019   Fluad Trivalent(High Dose 65+) 12/01/2022   Influenza Whole 01/09/2002   Influenza, High Dose Seasonal PF 11/24/2018, 02/08/2019   Influenza,inj,Quad PF,6+ Mos 11/29/2012, 11/29/2013   Influenza-Unspecified 11/30/2014, 11/22/2015, 01/12/2018, 11/27/2021   MMR 07/12/2017   PFIZER(Purple Top)SARS-COV-2 Vaccination 03/11/2019, 04/01/2019, 11/07/2019, 06/18/2020   Pfizer Covid-19 Vaccine Bivalent Booster 74yrs & up 12/31/2020   Pneumococcal Conjugate-13 03/20/2014   Pneumococcal Polysaccharide-23 03/30/1996, 02/13/2010    Respiratory Syncytial Virus Vaccine,Recomb Aduvanted(Arexvy) 12/15/2021   Td 03/30/1996, 04/26/2007   Td (Adult) 03/30/1996, 04/26/2007   Tdap 07/24/2018   Unspecified SARS-COV-2 Vaccination 11/27/2021   Zoster Recombinant(Shingrix) 07/24/2018, 11/24/2018, 02/08/2019    Screening Tests Health Maintenance  Topic Date Due   COVID-19 Vaccine (  7 - 2024-25 season) 10/31/2022   INFLUENZA VACCINE  09/30/2023   FOOT EXAM  10/07/2023   HEMOGLOBIN A1C  01/18/2024   OPHTHALMOLOGY EXAM  07/31/2024   Medicare Annual Wellness (AWV)  09/12/2024   DTaP/Tdap/Td (6 - Td or Tdap) 07/23/2028   Pneumococcal Vaccine: 50+ Years  Completed   Zoster Vaccines- Shingrix  Completed   Hepatitis B Vaccines  Aged Out   HPV VACCINES  Aged Out   Meningococcal B Vaccine  Aged Out   Colonoscopy  Discontinued    Health Maintenance  Health Maintenance Due  Topic Date Due   COVID-19 Vaccine (7 - 2024-25 season) 10/31/2022   Health Maintenance Items Addressed: None needed at this time  Additional Screening:  Vision Screening: Recommended annual ophthalmology exams for early detection of glaucoma and other disorders of the eye. Would you like a referral to an eye doctor? No    Dental Screening: Recommended annual dental exams for proper oral hygiene  Community Resource Referral / Chronic Care Management: CRR required this visit?  No   CCM required this visit?  No   Plan:    I have personally reviewed and noted the following in the patient's chart:   Medical and social history Use of alcohol , tobacco or illicit drugs  Current medications and supplements including opioid prescriptions. Patient is currently taking opioid prescriptions. Information provided to patient regarding non-opioid alternatives. Patient advised to discuss non-opioid treatment plan with their provider. Functional ability and status Nutritional status Physical activity Advanced directives List of other  physicians Hospitalizations, surgeries, and ER visits in previous 12 months Vitals Screenings to include cognitive, depression, and falls Referrals and appointments  In addition, I have reviewed and discussed with patient certain preventive protocols, quality metrics, and best practice recommendations. A written personalized care plan for preventive services as well as general preventive health recommendations were provided to patient.   Erminio LITTIE Saris, LPN   [id number]   After Visit Summary: (MyChart) Due to this being a telephonic visit, the after visit summary with patients personalized plan was offered to patient via MyChart   Notes: Nothing significant to report at this time.

## 2023-09-14 DIAGNOSIS — M6281 Muscle weakness (generalized): Secondary | ICD-10-CM | POA: Diagnosis not present

## 2023-09-14 DIAGNOSIS — R2681 Unsteadiness on feet: Secondary | ICD-10-CM | POA: Diagnosis not present

## 2023-09-15 ENCOUNTER — Ambulatory Visit (INDEPENDENT_AMBULATORY_CARE_PROVIDER_SITE_OTHER): Admitting: Podiatry

## 2023-09-15 DIAGNOSIS — L97522 Non-pressure chronic ulcer of other part of left foot with fat layer exposed: Secondary | ICD-10-CM | POA: Diagnosis not present

## 2023-09-15 NOTE — Progress Notes (Signed)
 Application of Amnio-Maxx Graft: -arterial studies and/or Vascular intervention: ABIs PVR shows triphasic flow to both lower extremity -Patient has severe morbidity: Diabetes -Patient was also nutritionally optimized and encouraged protein rich diet -Smoking cessation was discussed in extensive detail however patient does not currently smoke  -Wound start date: 06/23/2023 -Interventions attempted: Betadine wet to dry/local wound care, offloading, immobilization  -Decision was made to use Amniotic graft since partial / full thickness skin grafts such as Theraskin can lead to scarring and wound recurrence. The clinic did not use any synthetic grafts due to higher chance of foreign body reactions /rejection. Cryo-preserved amniotic graft was not used since storage infrastructure is not available at this outpatient clinic and the patient is unwilling to go to a wound care center. Grafts requiring upfront purchase were not considered due to financial limitations of this outpatient clinic and thus only grafts available on consignment were considered. Amionitic was chosen based on various characteristics including but not limited to scaffolding properties, heavy chain hyaluronic acid, cytokines, and grown factors to expedite wound healing.  Graft application: #6 -The wound was cleansed with saline. Selective debridement techniques to remove devitalized tissue was performed through subcutaneous tissue with 15 blade until viable bleeding tissue was encountered. -Topical application of Amnio-Max graft was placed over the wound and dressed with adaptic non-adherent, gauze, kling and ACE bandage. -Product Code: AMN-22 -Expiration: 07/12/2028 -Lot# [id number]  Procedure: Excisional Debridement of Wound Tool: Sharp chisel blade/tissue nipper Rationale: Removal of non-viable soft tissue from the wound to promote healing.  Anesthesia: none Pre-Debridement Wound Measurements: 0.4 cm x 0.4 cm x 0.3 cm   Post-Debridement Wound Measurements: 0.4 cm x 0.4 cm x 0.3 cm  Type of Debridement: Sharp Excisional Tissue Removed: Non-viable soft tissue Blood loss: Minimal (<50cc) Depth of Debridement: subcutaneous tissue. Technique: Sharp excisional debridement to bleeding, viable wound base.  Wound Progress: Wound decreased slightly more granulation tissue noted  Dressing: Dry, sterile, compression dressing. Disposition: Patient tolerated procedure well. Patient to return in 1 week for follow-up.    -The entire graft was utilized and non discarded. Graft was used in wound described above. I folded the graft in order to maximize amount of product used for the wound and maximize benefit from scaffold properties of the amniotic graft.

## 2023-09-16 DIAGNOSIS — M6281 Muscle weakness (generalized): Secondary | ICD-10-CM | POA: Diagnosis not present

## 2023-09-16 DIAGNOSIS — R2681 Unsteadiness on feet: Secondary | ICD-10-CM | POA: Diagnosis not present

## 2023-09-19 ENCOUNTER — Telehealth: Payer: Self-pay | Admitting: Cardiology

## 2023-09-19 DIAGNOSIS — R2681 Unsteadiness on feet: Secondary | ICD-10-CM | POA: Diagnosis not present

## 2023-09-19 DIAGNOSIS — M6281 Muscle weakness (generalized): Secondary | ICD-10-CM | POA: Diagnosis not present

## 2023-09-19 NOTE — Telephone Encounter (Signed)
 Patient returned RN's call regarding echocardiogram results.

## 2023-09-19 NOTE — Telephone Encounter (Signed)
 See echo result note from 7/14

## 2023-09-20 ENCOUNTER — Ambulatory Visit (INDEPENDENT_AMBULATORY_CARE_PROVIDER_SITE_OTHER): Admitting: Podiatry

## 2023-09-20 DIAGNOSIS — E1142 Type 2 diabetes mellitus with diabetic polyneuropathy: Secondary | ICD-10-CM

## 2023-09-20 DIAGNOSIS — L97522 Non-pressure chronic ulcer of other part of left foot with fat layer exposed: Secondary | ICD-10-CM

## 2023-09-20 MED ORDER — GABAPENTIN 100 MG PO CAPS: 100.0000 mg | ORAL_CAPSULE | Freq: Three times a day (TID) | ORAL | 3 refills | Status: AC

## 2023-09-20 NOTE — Progress Notes (Signed)
 Subjective:  Patient ID: Kyle Simon, male    DOB: Dec 17, 1932,  MRN: [id number]  Chief Complaint  Patient presents with   Foot Ulcer    Left great toe ulcer     89 y.o. male presents for wound care.  Patient presents with follow-up of left dorsal ulceration he states is doing a lot better his pain is completely gone denies any other acute complaints   Review of Systems: Negative except as noted in the HPI. Denies N/V/F/Ch.  Past Medical History:  Diagnosis Date   Arthritis    both hands;Dr.Deveshwar   Cataract 2010   Constipation due to pain medication    Diabetes mellitus without complication (HCC)    Type II. Uses no medication. Pt controls with diet and weight   Enlarged prostate    Frequent urination at night    GERD (gastroesophageal reflux disease)    History of noncompliance with medical treatment, presenting hazards to health 11/20/2018   Hyperlipidemia associated with type 2 diabetes mellitus (HCC) 04/19/2007   Qualifier: Diagnosis of  By: Tish MD, Elsie     Hypertension    Hypothyroidism    Polio    as a child w/o complications   Snoring    no sleep apnea   Statin declined-  11/20/2018   patient understands risks associated with this decision and has been advised to restart by cardiologist as well as myself several times   Torn meniscus    Transfusion history 2012   post TKR    Current Outpatient Medications:    celecoxib  (CELEBREX ) 100 MG capsule, Take 100 mg by mouth 2 (two) times daily., Disp: , Rfl:    diclofenac  Sodium (VOLTAREN ) 1 % GEL, Apply 2 g topically at bedtime., Disp: 100 g, Rfl: 2   doxycycline  (VIBRA -TABS) 100 MG tablet, Take 1 tablet (100 mg total) by mouth 2 (two) times daily., Disp: 20 tablet, Rfl: 0   fexofenadine  (ALLEGRA  ALLERGY) 180 MG tablet, Take 1 tablet (180 mg total) by mouth daily., Disp: 90 tablet, Rfl: 0   furosemide  (LASIX ) 20 MG tablet, TAKE 1 TABLET (20 MG TOTAL) BY MOUTH DAILY. TAKE 1 TABLET (20 MG TOTAL) BY MOUTH  DAILY FOR 7 DAYS., Disp: 90 tablet, Rfl: 2   HYDROcodone -acetaminophen  (NORCO/VICODIN) 5-325 MG tablet, Take 1 tablet by mouth every 6 (six) hours as needed for moderate pain (pain score 4-6)., Disp: 30 tablet, Rfl: 0   Hypromellose (ARTIFICIAL TEARS OP), Place 1 drop into both eyes 2 (two) times daily., Disp: , Rfl:    Multiple Vitamin (MULTIVITAMIN PO), Take by mouth daily., Disp: , Rfl:    Omega-3 Fatty Acids (FISH OIL) 1000 MG CAPS, Take 1,000 mg by mouth every evening., Disp: , Rfl:    omeprazole  (PRILOSEC) 20 MG capsule, Take 1 capsule (20 mg total) by mouth daily., Disp: 30 capsule, Rfl: 0   rosuvastatin  (CRESTOR ) 5 MG tablet, Take 1 tablet (5 mg total) by mouth at bedtime., Disp: 7 tablet, Rfl: 0   SYNTHROID  137 MCG tablet, TAKE 1 TABLET DAILY BEFORE BREAKFAST, Disp: 90 tablet, Rfl: 3   tamsulosin  (FLOMAX ) 0.4 MG CAPS capsule, TAKE 1 CAPSULE TWICE A DAY, Disp: 180 capsule, Rfl: 3   traMADol  (ULTRAM ) 50 MG tablet, Take 1 tablet (50 mg total) by mouth every 8 (eight) hours as needed for moderate pain (pain score 4-6)., Disp: 20 tablet, Rfl: 0   valsartan  (DIOVAN ) 320 MG tablet, TAKE 1 TABLET BY MOUTH EVERY DAY, Disp: 90 tablet, Rfl: 3  Social History   Tobacco Use  Smoking Status Former   Current packs/day: 0.00   Average packs/day: 1 pack/day for 18.0 years (18.0 ttl pk-yrs)   Types: Cigarettes   Start date: 03/01/1948   Quit date: 03/01/1966   Years since quitting: 57.5   Passive exposure: Never  Smokeless Tobacco Never    Allergies  Allergen Reactions   Simvastatin Other (See Comments)    MYALGIAS   Oxycodone  Other (See Comments)    Severe constipation   Objective:  There were no vitals filed for this visit. There is no height or weight on file to calculate BMI. Constitutional Well developed. Well nourished.  Vascular Dorsalis pedis pulses palpable bilaterally. Posterior tibial pulses palpable bilaterally. Capillary refill normal to all digits.  No cyanosis or clubbing  noted. Pedal hair growth normal.  Neurologic Normal speech. Oriented to person, place, and time. Protective sensation absent  Dermatologic Left dorsal ulceration completely reepithelialized.  No further signs of ulceration noted.  No further sign of breakdown noted.  Orthopedic: No pain to palpation either foot.   Radiographs: None   Assessment:   No diagnosis found.  Plan:  Patient was evaluated and treated and all questions answered.  Lateral peripheral neuropathy with history of diabetes - Given the presence of neuropathy patient will benefit from referral with an neuropathy specialist Dr. Lafayette.  Pain referral was placed.   Ulcer left dorsal toe ulceration with fat layer exposed Clinically healed after multiple application of amnio max graft.  At this time patient can return to regular activities no restrictions I will check back with him for final visit in 4 weeks.    No follow-ups on file.

## 2023-09-21 ENCOUNTER — Ambulatory Visit: Payer: Medicare Other | Attending: Rheumatology | Admitting: Rheumatology

## 2023-09-21 ENCOUNTER — Encounter: Payer: Self-pay | Admitting: Rheumatology

## 2023-09-21 VITALS — BP 133/66 | HR 63 | Resp 16 | Ht 70.0 in | Wt 189.2 lb

## 2023-09-21 DIAGNOSIS — M25511 Pain in right shoulder: Secondary | ICD-10-CM | POA: Diagnosis not present

## 2023-09-21 DIAGNOSIS — M25571 Pain in right ankle and joints of right foot: Secondary | ICD-10-CM | POA: Insufficient documentation

## 2023-09-21 DIAGNOSIS — Z981 Arthrodesis status: Secondary | ICD-10-CM | POA: Insufficient documentation

## 2023-09-21 DIAGNOSIS — Z862 Personal history of diseases of the blood and blood-forming organs and certain disorders involving the immune mechanism: Secondary | ICD-10-CM | POA: Diagnosis not present

## 2023-09-21 DIAGNOSIS — S91302D Unspecified open wound, left foot, subsequent encounter: Secondary | ICD-10-CM | POA: Diagnosis not present

## 2023-09-21 DIAGNOSIS — Z8639 Personal history of other endocrine, nutritional and metabolic disease: Secondary | ICD-10-CM | POA: Insufficient documentation

## 2023-09-21 DIAGNOSIS — Z8719 Personal history of other diseases of the digestive system: Secondary | ICD-10-CM | POA: Diagnosis not present

## 2023-09-21 DIAGNOSIS — Z9181 History of falling: Secondary | ICD-10-CM | POA: Diagnosis not present

## 2023-09-21 DIAGNOSIS — I700 Atherosclerosis of aorta: Secondary | ICD-10-CM | POA: Diagnosis not present

## 2023-09-21 DIAGNOSIS — Z96653 Presence of artificial knee joint, bilateral: Secondary | ICD-10-CM | POA: Diagnosis not present

## 2023-09-21 DIAGNOSIS — M810 Age-related osteoporosis without current pathological fracture: Secondary | ICD-10-CM | POA: Insufficient documentation

## 2023-09-21 DIAGNOSIS — G8929 Other chronic pain: Secondary | ICD-10-CM | POA: Insufficient documentation

## 2023-09-21 DIAGNOSIS — K5730 Diverticulosis of large intestine without perforation or abscess without bleeding: Secondary | ICD-10-CM | POA: Insufficient documentation

## 2023-09-21 DIAGNOSIS — Z8679 Personal history of other diseases of the circulatory system: Secondary | ICD-10-CM | POA: Diagnosis not present

## 2023-09-21 DIAGNOSIS — M4004 Postural kyphosis, thoracic region: Secondary | ICD-10-CM | POA: Diagnosis not present

## 2023-09-21 NOTE — Patient Instructions (Addendum)
 Shoulder Exercises Ask your health care provider which exercises are safe for you. Do exercises exactly as told by your health care provider and adjust them as directed. It is normal to feel mild stretching, pulling, tightness, or discomfort as you do these exercises. Stop right away if you feel sudden pain or your pain gets worse. Do not begin these exercises until told by your health care provider. Stretching exercises External rotation and abduction This exercise is sometimes called corner stretch. The exercise rotates your arm outward (external rotation) and moves your arm out from your body (abduction). Stand in a doorway with one of your feet slightly in front of the other. This is called a staggered stance. If you cannot reach your forearms to the door frame, stand facing a corner of a room. Choose one of the following positions as told by your health care provider: Place your hands and forearms on the door frame above your head. Place your hands and forearms on the door frame at the height of your head. Place your hands on the door frame at the height of your elbows. Slowly move your weight onto your front foot until you feel a stretch across your chest and in the front of your shoulders. Keep your head and chest upright and keep your abdominal muscles tight. Hold for __________ seconds. To release the stretch, shift your weight to your back foot. Repeat __________ times. Complete this exercise __________ times a day. Extension, standing  Stand and hold a broomstick, a cane, or a similar object behind your back. Your hands should be a little wider than shoulder-width apart. Your palms should face away from your back. Keeping your elbows straight and your shoulder muscles relaxed, move the stick away from your body until you feel a stretch in your shoulders (extension). Avoid shrugging your shoulders while you move the stick. Keep your shoulder blades tucked down toward the middle of your  back. Hold for __________ seconds. Slowly return to the starting position. Repeat __________ times. Complete this exercise __________ times a day. Range-of-motion exercises Pendulum  Stand near a wall or a surface that you can hold onto for balance. Bend at the waist and let your left / right arm hang straight down. Use your other arm to support you. Keep your back straight and do not lock your knees. Relax your left / right arm and shoulder muscles, and move your hips and your trunk so your left / right arm swings freely. Your arm should swing because of the motion of your body, not because you are using your arm or shoulder muscles. Keep moving your hips and trunk so your arm swings in the following directions, as told by your health care provider: Side to side. Forward and backward. In clockwise and counterclockwise circles. Continue each motion for __________ seconds, or for as long as told by your health care provider. Slowly return to the starting position. Repeat __________ times. Complete this exercise __________ times a day. Shoulder flexion, standing  Stand and hold a broomstick, a cane, or a similar object. Place your hands a little more than shoulder-width apart on the object. Your left / right hand should be palm-up, and your other hand should be palm-down. Keep your elbow straight and your shoulder muscles relaxed. Push the stick up with your healthy arm to raise your left / right arm in front of your body, and then over your head until you feel a stretch in your shoulder (flexion). Avoid shrugging your shoulder  while you raise your arm. Keep your shoulder blade tucked down toward the middle of your back. Hold for __________ seconds. Slowly return to the starting position. Repeat __________ times. Complete this exercise __________ times a day. Shoulder abduction, standing  Stand and hold a broomstick, a cane, or a similar object. Place your hands a little more than  shoulder-width apart on the object. Your left / right hand should be palm-up, and your other hand should be palm-down. Keep your elbow straight and your shoulder muscles relaxed. Push the object across your body toward your left / right side. Raise your left / right arm to the side of your body (abduction) until you feel a stretch in your shoulder. Do not raise your arm above shoulder height unless your health care provider tells you to do that. If directed, raise your arm over your head. Avoid shrugging your shoulder while you raise your arm. Keep your shoulder blade tucked down toward the middle of your back. Hold for __________ seconds. Slowly return to the starting position. Repeat __________ times. Complete this exercise __________ times a day. Internal rotation  Place your left / right hand behind your back, palm-up. Use your other hand to dangle an exercise band, a broomstick, or a similar object over your shoulder. Grasp the band with your left / right hand so you are holding on to both ends. Gently pull up on the band until you feel a stretch in the front of your left / right shoulder. The movement of your arm toward the center of your body is called internal rotation. Avoid shrugging your shoulder while you raise your arm. Keep your shoulder blade tucked down toward the middle of your back. Hold for __________ seconds. Release the stretch by letting go of the band and lowering your hands. Repeat __________ times. Complete this exercise __________ times a day. Strengthening exercises External rotation  Sit in a stable chair without armrests. Secure an exercise band to a stable object at elbow height on your left / right side. Place a soft object, such as a folded towel or a small pillow, between your left / right upper arm and your body to move your elbow about 4 inches (10 cm) away from your side. Hold the end of the exercise band so it is tight and there is no slack. Keeping your  elbow pressed against the soft object, slowly move your forearm out, away from your abdomen (external rotation). Keep your body steady so only your forearm moves. Hold for __________ seconds. Slowly return to the starting position. Repeat __________ times. Complete this exercise __________ times a day. Shoulder abduction  Sit in a stable chair without armrests, or stand up. Hold a __________ lb / kg weight in your left / right hand, or hold an exercise band with both hands. Start with your arms straight down and your left / right palm facing in, toward your body. Slowly lift your left / right hand out to your side (abduction). Do not lift your hand above shoulder height unless your health care provider tells you that this is safe. Keep your arms straight. Avoid shrugging your shoulder while you do this movement. Keep your shoulder blade tucked down toward the middle of your back. Hold for __________ seconds. Slowly lower your arm, and return to the starting position. Repeat __________ times. Complete this exercise __________ times a day. Shoulder extension  Sit in a stable chair without armrests, or stand up. Secure an exercise band to a  stable object in front of you so it is at shoulder height. Hold one end of the exercise band in each hand. Straighten your elbows and lift your hands up to shoulder height. Squeeze your shoulder blades together as you pull your hands down to the sides of your thighs (extension). Stop when your hands are straight down by your sides. Do not let your hands go behind your body. Hold for __________ seconds. Slowly return to the starting position. Repeat __________ times. Complete this exercise __________ times a day. Shoulder row  Sit in a stable chair without armrests, or stand up. Secure an exercise band to a stable object in front of you so it is at chest height. Hold one end of the exercise band in each hand. Position your palms so that your thumbs are  facing the ceiling (neutral position). Bend each of your elbows to a 90-degree angle (right angle) and keep your upper arms at your sides. Step back or move the chair back until the band is tight and there is no slack. Slowly pull your elbows back behind you. Hold for __________ seconds. Slowly return to the starting position. Repeat __________ times. Complete this exercise __________ times a day. Shoulder press-ups  Sit in a stable chair that has armrests. Sit upright, with your feet flat on the floor. Put your hands on the armrests so your elbows are bent and your fingers are pointing forward. Your hands should be about even with the sides of your body. Push down on the armrests and use your arms to lift yourself off the chair. Straighten your elbows and lift yourself up as much as you comfortably can. Move your shoulder blades down, and avoid letting your shoulders move up toward your ears. Keep your feet on the ground. As you get stronger, your feet should support less of your body weight as you lift yourself up. Hold for __________ seconds. Slowly lower yourself back into the chair. Repeat __________ times. Complete this exercise __________ times a day. Wall push-ups  Stand so you are facing a stable wall. Your feet should be about one arm-length away from the wall. Lean forward and place your palms on the wall at shoulder height. Keep your feet flat on the floor as you bend your elbows and lean forward toward the wall. Hold for __________ seconds. Straighten your elbows to push yourself back to the starting position. Repeat __________ times. Complete this exercise __________ times a day. This information is not intended to replace advice given to you by your health care provider. Make sure you discuss any questions you have with your health care provider. Document Revised: 04/07/2021 Document Reviewed: 04/07/2021 Elsevier Patient Education  2024 [employer]  Exercises for  Chronic Knee Pain Chronic knee pain is pain that lasts longer than 3 months. For most people with chronic knee pain, exercise and weight loss is an important part of treatment. Your health care provider may want you to focus on: Making the muscles that support your knee stronger. This can take pressure off your knee and reduce pain. Preventing knee stiffness. How far you can move your knee, keeping it there or making it farther. Losing weight (if this applies) to take pressure off your knee, lower your risk for injury, and make it easier for you to exercise. Your provider will help you make an exercise program that fits your needs and physical abilities. Below are simple, low-impact exercises you can do at home. Ask your provider or physical therapist  how often you should do your exercise program and how many times to repeat each exercise. General safety tips  Get your provider's approval before doing any exercises. Start slowly and stop any time you feel pain. Do not exercise if your knee pain is flaring up. Warm up first. Stretching a cold muscle can cause an injury. Do 5-10 minutes of easy movement or light stretching before beginning your exercises. Do 5-10 minutes of low-impact activity (like walking or cycling) before starting strengthening exercises. Contact your provider any time you have pain during or after exercising. Exercise can cause discomfort but should not be painful. It is normal to be a little stiff or sore after exercising. Stretching and range-of-motion exercises Front thigh stretch  Stand up straight and support your body by holding on to a chair or resting one hand on a wall. With your legs straight and close together, bend one knee to lift your heel up toward your butt. Using one hand for support, grab your ankle with your free hand. Pull your foot up closer toward your butt to feel the stretch in front of your thigh. Hold the stretch for 30 seconds. Repeat __________  times. Complete this exercise __________ times a day. Back thigh stretch  Sit on the floor with your back straight and your legs out straight in front of you. Place the palms of your hands on the floor and slide them toward your feet as you bend at the hip. Try to touch your nose to your knees and feel the stretch in the back of your thighs. Hold for 30 seconds. Repeat __________ times. Complete this exercise __________ times a day. Calf stretch  Stand facing a wall. Place the palms of your hands flat against the wall, arms extended, and lean slightly against the wall. Get into a lunge position with one leg bent at the knee and the other leg stretched out straight behind you. Keep both feet facing the wall and increase the bend in your knee while keeping the heel of the other leg flat on the ground. You should feel the stretch in your calf. Hold for 30 seconds. Repeat __________ times. Complete this exercise __________ times a day. Strengthening exercises Straight leg lift  Lie on your back with one knee bent and the other leg out straight. Slowly lift the straight leg without bending the knee. Lift until your foot is about 12 inches (30 cm) off the floor. Hold for 3-5 seconds and slowly lower your leg. Repeat __________ times. Complete this exercise __________ times a day. Single leg dip  Stand between two chairs and put both hands on the backs of the chairs for support. Extend one leg out straight with your body weight resting on the heel of the standing leg. Slowly bend your standing knee to dip your body to the level that is comfortable for you. Hold for 3-5 seconds. Repeat __________ times. Complete this exercise __________ times a day. Hamstring curls  Stand straight, knees close together, facing the back of a chair. Hold on to the back of a chair with both hands. Keep one leg straight. Bend the other knee while bringing the heel up toward the butt until the knee is bent at a  90-degree angle (right angle). Hold for 3-5 seconds. Repeat __________ times. Complete this exercise __________ times a day. Wall squat  Stand straight with your back, hips, and head against a wall. Step forward one foot at a time with your back still against the  wall. Your feet should be 2 feet (61 cm) from the wall at shoulder width. Keeping your back, hips, and head against the wall, slide down the wall to as close to a sitting position as you can get. Hold for 5-10 seconds, then slowly slide back up. Repeat __________ times. Complete this exercise __________ times a day. Step-ups  Stand in front of a sturdy platform or stool that is about 6 inches (15 cm) high. Slowly step up with your left / right foot, keeping your knee in line with your hip and foot. Do not let your knee bend so far that you cannot see your toes. Hold on to a chair for balance, but do not use it for support. Slowly unlock your knee and lower yourself to the starting position. Repeat __________ times. Complete this exercise __________ times a day. Contact a health care provider if: Your exercises cause pain. Your pain is worse after you exercise. Your pain prevents you from doing your exercises. This information is not intended to replace advice given to you by your health care provider. Make sure you discuss any questions you have with your health care provider. Document Revised: 03/02/2022 Document Reviewed: 03/02/2022 Elsevier Patient Education  2024 [employer].

## 2023-09-22 ENCOUNTER — Ambulatory Visit: Admitting: Podiatry

## 2023-09-22 NOTE — Telephone Encounter (Signed)
 Error

## 2023-09-23 ENCOUNTER — Ambulatory Visit: Admitting: Podiatry

## 2023-09-23 DIAGNOSIS — M6281 Muscle weakness (generalized): Secondary | ICD-10-CM | POA: Diagnosis not present

## 2023-09-23 DIAGNOSIS — R2681 Unsteadiness on feet: Secondary | ICD-10-CM | POA: Diagnosis not present

## 2023-09-26 ENCOUNTER — Other Ambulatory Visit: Payer: Self-pay | Admitting: Family

## 2023-09-26 DIAGNOSIS — R2681 Unsteadiness on feet: Secondary | ICD-10-CM | POA: Diagnosis not present

## 2023-09-26 DIAGNOSIS — M6281 Muscle weakness (generalized): Secondary | ICD-10-CM | POA: Diagnosis not present

## 2023-09-26 DIAGNOSIS — E1169 Type 2 diabetes mellitus with other specified complication: Secondary | ICD-10-CM

## 2023-09-29 DIAGNOSIS — M6281 Muscle weakness (generalized): Secondary | ICD-10-CM | POA: Diagnosis not present

## 2023-09-29 DIAGNOSIS — R2681 Unsteadiness on feet: Secondary | ICD-10-CM | POA: Diagnosis not present

## 2023-10-02 ENCOUNTER — Other Ambulatory Visit: Payer: Self-pay | Admitting: Family

## 2023-10-02 DIAGNOSIS — R053 Chronic cough: Secondary | ICD-10-CM

## 2023-10-03 ENCOUNTER — Telehealth: Payer: Self-pay | Admitting: Podiatry

## 2023-10-03 DIAGNOSIS — R2681 Unsteadiness on feet: Secondary | ICD-10-CM | POA: Diagnosis not present

## 2023-10-03 DIAGNOSIS — M6281 Muscle weakness (generalized): Secondary | ICD-10-CM | POA: Diagnosis not present

## 2023-10-03 NOTE — Telephone Encounter (Signed)
 Top of great toe, left foot, moisture is on great toe and also on great toe is a small drop of fluid. As per patient, he would like to know, what would you like for patient to do? Patient contact telephone number, [phone]

## 2023-10-04 NOTE — Telephone Encounter (Signed)
 Tried calling patient no answer left detailed message.

## 2023-10-05 ENCOUNTER — Telehealth: Payer: Self-pay | Admitting: Podiatry

## 2023-10-05 NOTE — Telephone Encounter (Signed)
 Patient is not sure if graft came off or a scab.  Wants to know if you need to see him sooner.

## 2023-10-05 NOTE — Telephone Encounter (Signed)
 Spoke with patient an informed him and advised he will be seen next visit.

## 2023-10-06 ENCOUNTER — Encounter: Payer: Self-pay | Admitting: Student in an Organized Health Care Education/Training Program

## 2023-10-06 ENCOUNTER — Ambulatory Visit
Attending: Student in an Organized Health Care Education/Training Program | Admitting: Student in an Organized Health Care Education/Training Program

## 2023-10-06 VITALS — BP 149/82 | HR 53 | Temp 97.2°F | Resp 16 | Ht 70.0 in | Wt 181.0 lb

## 2023-10-06 DIAGNOSIS — G894 Chronic pain syndrome: Secondary | ICD-10-CM | POA: Diagnosis not present

## 2023-10-06 DIAGNOSIS — M75102 Unspecified rotator cuff tear or rupture of left shoulder, not specified as traumatic: Secondary | ICD-10-CM | POA: Diagnosis not present

## 2023-10-06 DIAGNOSIS — E1140 Type 2 diabetes mellitus with diabetic neuropathy, unspecified: Secondary | ICD-10-CM | POA: Insufficient documentation

## 2023-10-06 DIAGNOSIS — M12812 Other specific arthropathies, not elsewhere classified, left shoulder: Secondary | ICD-10-CM | POA: Insufficient documentation

## 2023-10-06 DIAGNOSIS — M6281 Muscle weakness (generalized): Secondary | ICD-10-CM | POA: Diagnosis not present

## 2023-10-06 DIAGNOSIS — M25562 Pain in left knee: Secondary | ICD-10-CM | POA: Insufficient documentation

## 2023-10-06 DIAGNOSIS — M961 Postlaminectomy syndrome, not elsewhere classified: Secondary | ICD-10-CM | POA: Insufficient documentation

## 2023-10-06 DIAGNOSIS — Z96653 Presence of artificial knee joint, bilateral: Secondary | ICD-10-CM | POA: Diagnosis not present

## 2023-10-06 DIAGNOSIS — G8929 Other chronic pain: Secondary | ICD-10-CM | POA: Insufficient documentation

## 2023-10-06 DIAGNOSIS — M25561 Pain in right knee: Secondary | ICD-10-CM | POA: Diagnosis not present

## 2023-10-06 DIAGNOSIS — M19012 Primary osteoarthritis, left shoulder: Secondary | ICD-10-CM | POA: Insufficient documentation

## 2023-10-06 DIAGNOSIS — R2681 Unsteadiness on feet: Secondary | ICD-10-CM | POA: Diagnosis not present

## 2023-10-06 NOTE — Progress Notes (Signed)
 PROVIDER NOTE: Interpretation of information contained herein should be left to medically-trained personnel. Specific patient instructions are provided elsewhere under Patient Instructions section of medical record. This document was created in part using AI and STT-dictation technology, any transcriptional errors that may result from this process are unintentional.  Patient: Kyle Simon Guardian  Service: E/M Encounter  Provider: Wallie Sherry, MD  DOB: [birth year]  Delivery: Face-to-face  Specialty: Interventional Pain Management  MRN: [id number]  Setting: Ambulatory outpatient facility  Specialty designation: 09  Type: New Patient  Location: Outpatient office facility  PCP: Corwin Antu, FNP  DOS: 10/06/2023    Referring Prov.: Corwin Antu, FNP   Primary Reason(s) for Visit: Encounter for initial evaluation of one or more chronic problems (new to examiner) potentially causing chronic pain, and posing a threat to normal musculoskeletal function. (Level of risk: High) CC: Foot Pain (Bilateral, worse on left side) and Shoulder Pain (left)  HPI  Kyle Simon is a 89 y.o. year old, male patient, who comes for the first time to our practice referred by Corwin Antu, FNP for our initial evaluation of his chronic pain. He has Hypothyroidism; Hyperlipidemia associated with type 2 diabetes mellitus (HCC); SENILE KERATOSIS; BPH (benign prostatic hyperplasia); GERD (gastroesophageal reflux disease); Anemia, unspecified; Diverticulosis of colon without hemorrhage; Spondylolisthesis of lumbar region; Hypertension associated with diabetes (HCC); Chronic rhinitis; Type 2 diabetes mellitus with other specified complication (HCC); Hypomagnesemia; Trochanteric bursitis of right hip; ETD (Eustachian tube dysfunction), right; Arthralgia of right temporomandibular joint; Chronic eczematous otitis externa of both ears; Presbycusis of both ears; Status post bilateral knee replacements; DM assoc with CKD (chronic kidney disease),  stage III (HCC); Lumbar radiculopathy; Chronic pain of both knees; Aortic atherosclerosis (HCC); Elevated coronary artery calcium  score; Dyslipidemia; Balance problems; Memory loss; Poor dentition; Tinnitus of both ears; Elevated fecal calprotectin; Chronic synovitis; Thoracic aorta atherosclerosis (HCC); Calcium  blood decreased; Cerebral infarction, chronic; Muscle spasm; Physical debility; Weakness of both lower extremities; Unstable gait; History of fall; Multiple falls; Reactive depression; Hyponatremia; Obstruction of sinonasal region; Chronic pansinusitis; Persistent dry cough; Chronic painful diabetic neuropathy (HCC); Chronic pain syndrome; and Lumbar post-laminectomy syndrome on their problem list. Today he comes in for evaluation of his Foot Pain (Bilateral, worse on left side) and Shoulder Pain (left)  Pain Assessment: Location: Left Shoulder Radiating: left arm Onset: More than a month ago Duration: Chronic pain Quality: Aching Severity: 4 /10 (subjective, self-reported pain score)  Effect on ADL:   Timing: Constant Modifying factors: medication BP: (!) 149/82  HR: (!) 53  Onset and Duration: Present longer than 3 months Cause of pain: Unknown Severity: Getting worse, NAS-11 at its worse: 7/10, NAS-11 at its best: 4/10, and NAS-11 now: 4/10 Timing: Night Aggravating Factors: in bed Alleviating Factors: Sitting Associated Problems: Fatigue, Inability to control bladder (urine), Inability to control bowel, Tingling, Weakness, Pain that wakes patient up, and Pain that does not allow patient to sleep Quality of Pain: Aching, Intermittent, Distressing, Pulsating, Sharp, Shooting, and Throbbing Previous Examinations or Tests: X-rays and Orthopedic evaluation Previous Treatments: Physical Therapy and Trigger point injections  Mr. Prichett is being evaluated for possible interventional pain management therapies for the treatment of his chronic pain.   Discussed the use of AI scribe  software for clinical note transcription with the patient, who gave verbal consent to proceed.  History of Present Illness   Kyle Simon is an 89 year old male with neuropathic pain who presents with burning and tingling in both feet.  He experiences  burning and tingling sensations primarily in the left toe complex, extending about two inches off the toe. He has been receiving grafts on the big toe, which recently fell off, causing some bleeding that did not soak through the Band-Aid. The graft appeared crusty, resembling a scab.  He has pain in both feet, which worsens when lying down in bed, particularly two hours after falling asleep, causing a throbbing sensation in the toe. The pain subsides when he sits in his recliner, which is a lift chair.  He has a history of diabetes, with blood sugar levels maintained below 6.9, and is currently on two medications for it. He has not been on insulin . He also has a history of spine surgery performed 10-15 years ago, which resolved previous sciatica issues.  He has been prescribed gabapentin  100 mg three times a day, which he tolerates better than the previous 300 mg dose but still causes some wooziness. He also has hydrocodone  prescribed for pain management, which he finds helpful at night.  He has a history of knee replacements and uses Lasix  as needed for leg swelling, which has improved. He also reports left shoulder pain, which worsens with certain movements, and has had previous right shoulder surgery for a rotator cuff tear.  He has a history of a fall one year ago, resulting in fractures of the pubic rami, and has experienced persistent pain since then. 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       Meds   Current Outpatient Medications:    celecoxib  (CELEBREX ) 100 MG capsule, Take 100 mg by mouth 2 (two) times daily., Disp: , Rfl:    diclofenac  Sodium (VOLTAREN ) 1 % GEL, Apply 2 g  topically at bedtime., Disp: 100 g, Rfl: 2   furosemide  (LASIX ) 20 MG tablet, TAKE 1 TABLET (20 MG TOTAL) BY MOUTH DAILY. TAKE 1 TABLET (20 MG TOTAL) BY MOUTH DAILY FOR 7 DAYS., Disp: 90 tablet, Rfl: 2   gabapentin  (NEURONTIN ) 100 MG capsule, Take 1 capsule (100 mg total) by mouth 3 (three) times daily., Disp: 90 capsule, Rfl: 3   HYDROcodone -acetaminophen  (NORCO/VICODIN) 5-325 MG tablet, Take 1 tablet by mouth every 6 (six) hours as needed for moderate pain (pain score 4-6)., Disp: 30 tablet, Rfl: 0   Hypromellose (ARTIFICIAL TEARS OP), Place 1 drop into both eyes 2 (two) times daily., Disp: , Rfl:    Multiple Vitamin (MULTIVITAMIN PO), Take by mouth daily., Disp: , Rfl:    nortriptyline  (PAMELOR ) 10 MG capsule, Take 10 mg by mouth at bedtime., Disp: , Rfl:    Omega-3 Fatty Acids (FISH OIL) 1000 MG CAPS, Take 1,000 mg by mouth every evening., Disp: , Rfl:    omeprazole  (PRILOSEC) 20 MG capsule, TAKE 1 CAPSULE BY MOUTH EVERY DAY, Disp: 90 capsule, Rfl: 1   rosuvastatin  (CRESTOR ) 5 MG tablet, TAKE 1 TABLET AT BEDTIME, Disp: 90 tablet, Rfl: 1   SYNTHROID  137 MCG tablet, TAKE 1 TABLET DAILY BEFORE BREAKFAST, Disp: 90 tablet, Rfl: 3   tamsulosin  (FLOMAX ) 0.4 MG CAPS capsule, TAKE 1 CAPSULE TWICE A DAY, Disp: 180 capsule, Rfl: 3   traMADol  (ULTRAM ) 50 MG tablet, Take 1 tablet (50 mg total) by mouth every 8 (eight) hours as needed for moderate pain (pain score 4-6)., Disp: 20 tablet, Rfl: 0   valsartan  (DIOVAN ) 320 MG tablet, TAKE 1 TABLET BY MOUTH EVERY DAY, Disp: 90 tablet, Rfl: 3   doxycycline  (VIBRA -TABS) 100 MG tablet,  Take 1 tablet (100 mg total) by mouth 2 (two) times daily. (Patient not taking: Reported on 10/06/2023), Disp: 20 tablet, Rfl: 0   fexofenadine  (ALLEGRA  ALLERGY) 180 MG tablet, Take 1 tablet (180 mg total) by mouth daily. (Patient not taking: Reported on 10/06/2023), Disp: 90 tablet, Rfl: 0  Imaging Review    Narrative CLINICAL DATA:  Head trauma, moderate-severe fall, hit  head; Polytrauma, blunt  EXAM: CT HEAD WITHOUT CONTRAST  CT CERVICAL SPINE WITHOUT CONTRAST  TECHNIQUE: Multidetector CT imaging of the head and cervical spine was performed following the standard protocol without intravenous contrast. Multiplanar CT image reconstructions of the cervical spine were also generated.  RADIATION DOSE REDUCTION: This exam was performed according to the departmental dose-optimization program which includes automated exposure control, adjustment of the mA and/or kV according to patient size and/or use of iterative reconstruction technique.  COMPARISON:  CT Head 07/10/22  FINDINGS: CT HEAD FINDINGS  Brain: No evidence of acute infarction, hemorrhage, hydrocephalus, extra-axial collection or mass lesion/mass effect. 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  Vascular: No hyperdense vessel or unexpected calcification.  Skull: Normal. Negative for fracture or focal lesion.  Sinuses/Orbits: No middle ear or mastoid effusion. [facility] pattern left sided sinusitis. Bilateral lens replacement. Orbits are otherwise unremarkable.  Other: None.  CT CERVICAL SPINE FINDINGS  Alignment: Normal.  Skull base and vertebrae: No acute fracture. No primary bone lesion or focal pathologic process.  Soft tissues and spinal canal: No prevertebral fluid or swelling. No visible canal hematoma.  Disc levels:  No evidence of high-grade spinal canal stenosis.  Upper chest: Negative.  Other: Soft tissue lipoma along the midline posterior back.  IMPRESSION: 1. No acute intracranial abnormality. 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 2. No acute fracture or traumatic malalignment of the cervical spine.   Electronically  Signed By: Lyndall Gore M.D. On: 11/04/2022 16:08   Narrative CLINICAL DATA:  Fall.  EXAM: CERVICAL SPINE - 2-3 VIEW  COMPARISON:  None Available.  FINDINGS: Bones are markedly demineralized. C7 and T1 are not visualized on the lateral projection. There is loss of disc height at C3-4, C4-5, and C5-6 with associated endplate spurring at the same levels. No prevertebral soft tissue swelling.  IMPRESSION: No evidence for an acute bony abnormality on this limited three views study.  Degenerative disc disease in the mid cervical spine.   Electronically Signed By: Camellia Candle M.D. On: 07/10/2022 11:38   Narrative CLINICAL DATA:  Was golfing 2 months ago and felt a pop.  Pain.  EXAM: MRI OF THE RIGHT SHOULDER WITHOUT CONTRAST  TECHNIQUE: Multiplanar, multisequence MR imaging of the shoulder was performed. No intravenous contrast was administered.  COMPARISON:  None.  FINDINGS: Rotator cuff: Complete tear of the supraspinatus and infraspinatus tendons with 3.7 cm of retraction. Teres minor tendon is intact. Subscapularis tendon is intact.  Muscles: There is no muscle atrophy. There is edema within the supraspinatus and infraspinatus muscles likely reflecting muscle strain.  Biceps long head: Mild tendinosis of the intraarticular portion of the long head of the biceps tendon.  Acromioclavicular Joint: Mild degenerative changes of the acromioclavicular joint. Type I acromion.  Glenohumeral Joint: No to significant joint effusion. Mild generalized chondromalacia.  Labrum: Grossly intact, but evaluation is limited by lack of intraarticular  fluid.  Bones: No focal marrow signal abnormality. No fracture or dislocation.  IMPRESSION: 1. Complete tear of the supraspinatus and infraspinatus tendons with 3.7 cm of retraction. Muscle edema within the supraspinatus and infraspinatus muscles likely reflecting muscle strain. No rotator cuff muscle atrophy. 2. Mild  tendinosis of the intraarticular portion of the long head of the biceps tendon.   Electronically Signed By: Julaine Blanch On: 11/29/2014 13:18   DG Shoulder Left  Narrative CLINICAL DATA:  Status post fall with left shoulder pain.  EXAM: LEFT SHOULDER - 2+ VIEW  COMPARISON:  None Available.  FINDINGS: There is no evidence of fracture or dislocation. Minor acromioclavicular and glenohumeral degenerative change. No erosions. The included ribs are intact. Soft tissues are unremarkable.  IMPRESSION: No fracture or dislocation of the left shoulder. Minor degenerative change.   Electronically Signed By: Andrea Gasman M.D. On: 11/04/2022 15:44    MR Lumbar Spine Wo Contrast  Narrative *RADIOLOGY REPORT*  Clinical Data: Low back and right leg pain.  MRI LUMBAR SPINE WITHOUT CONTRAST  Technique:  Multiplanar and multiecho pulse sequences of the lumbar spine were obtained without intravenous contrast.  Comparison: 04/28/2009.  Findings: Stable alignment of the lumbar vertebral bodies with mild degenerative retrolisthesis of L2.  Stable facet degenerative changes without definite pars defects.  The conus medullaris terminates at L1.  L1-2:  No significant findings or interval change.  L2-3:  Advanced degenerative disc disease with a bulging degenerated annulus and osteophytic ridging.  There is mild lateral recess encroachment bilaterally but the spinal canal is generous and is no significant spinal stenosis.  Mild stable foraminal stenosis bilaterally.  L3-4:  Mild annular bulge and mild osteophytic ridging but no significant spinal or foraminal stenosis.  L4-5:  Postoperative changes with a left-sided laminectomy. Resection of the left sided synovial cyst without recurrence. However, there is a new right sided synovial cyst with mass effect on the right side of the thecal sac and exiting out through the neural foramen adjacent to the right L4 nerve root.   This likely explains the patients right radicular symptoms.  There is mild right lateral recess stenosis also.  L5-S1:  Hypertrophic facet disease and small posterior directed synovial cyst.  No spinal or foraminal stenosis.  IMPRESSION:  1.  Resection of left sided synovial cyst at L4-5 with left-sided laminectomy defect.  There is a new right sided synovial cyst wrapping around the anterior aspect of the right facet joint with mass effect on the thecal sac and probable irritation of the right L4 nerve root.  Mild right lateral recess stenosis is also noted. 2.  Stable degenerative disc disease and facet disease at the other intervertebral disc space levels but no new or significant findings.  Original Report Authenticated By: P. ONEIL STAMMER, M.D.   DG Lumbar Spine 1 View  Narrative Clinical Data: Left L4-5 laminectomy.  LUMBAR SPINE - 1 VIEW  Comparison: MRI 11/16/2004  Findings: Single lateral intraoperative image demonstrates posterior surgical instruments directed at the L4-5 level.  IMPRESSION: Intraoperative localization as above.  Provider: Christopher Haddock    Narrative CLINICAL DATA:  Fall with back pain.  Hip pain.  EXAM: LUMBAR SPINE - 2-3 VIEW  COMPARISON:  None Available.  FINDINGS: Cross-table lateral views are limited due to overlying artifact. Postsurgical change at L3-L4 and L4-L5 with intact hardware. There is no evidence of acute fracture. Moderate degenerative disc disease at T12-L1, L1-L2, and L2-L3. The posterior elements are not well assessed.  IMPRESSION: 1.  No evidence of acute fracture of the lumbar spine. Limited posterior element assessment. 2. Postsurgical change at L3-L4 and L4-L5 with intact hardware.   Electronically Signed By: Andrea Gasman M.D. On: 11/04/2022 15:47  Narrative CLINICAL DATA:  fall, hip pain.  EXAM: DG HIP (WITH OR WITHOUT PELVIS) 2-3V LEFT  COMPARISON:  CT scan abdomen and pelvis from  09/06/2020.  FINDINGS: There is mildly displaced fracture of the left superior and inferior pubic rami, which is of indeterminate age. The fracture margins are not well visualized therefore dating of the fracture is limited; however favored acute. These fractures are new since the prior study from 09/06/2020. Correlate with physical examination for tenderness.  Pelvis is intact with normal and symmetric sacroiliac joints.  No aggressive osseous lesion.  Visualized sacral arcuate lines are unremarkable.  Unremarkable symphysis pubis.  There are moderate degenerative changes of bilateral hip joints in the form of reduced joint space and osteophytosis.  No radiopaque foreign bodies.  Partially seen lower lumbar spinal fixation hardware.  There are multiple metallic anchors overlying the right lower pelvis, likely from prior hernia repair.  IMPRESSION: 1. Mildly displaced fracture of the left superior and inferior pubic rami, of indeterminate age. Correlate with physical examination. 2. Moderate bilateral hip osteoarthritis.   Electronically Signed By: Ree Molt M.D. On: 11/04/2022 14:47    Narrative CLINICAL DATA:  Right knee pain for 3 weeks. History of right knee surgery and 2000.  EXAM: MRI OF THE RIGHT KNEE WITHOUT CONTRAST  TECHNIQUE: Multiplanar, multisequence MR imaging of the knee was performed. No intravenous contrast was administered.  COMPARISON:  MRI right knee 05/30/2012.  FINDINGS: MENISCI  Medial meniscus: Complex tear in the posterior horn has progressed since the prior study with increased blunting along the free edge. The tear extends into the posterior meniscal body in a horizontal orientation reaching the meniscal undersurface. No displaced fragment.  Lateral meniscus:  Intact.  LIGAMENTS  Cruciates: There is some mucoid degeneration of both ligaments without tear.  Collaterals:  Intact.  CARTILAGE  Patellofemoral:  Susceptibility artifact limits visualization somewhat but there is extensive hyaline cartilage loss throughout which does not appear notably changed.  Medial:  Somewhat thinned and irregular, unchanged.  Lateral:  Minimally degenerated, unchanged.  Joint:  Small joint effusion.  Popliteal Fossa: Baker's cyst measures up to 3.5 cm transverse by 1.9 cm AP by 9.2 cm craniocaudal is markedly increased in size since the prior exam. Main component of the cyst is at and superior to the joint line.  Extensor Mechanism:  Intact.  Bones:  No fracture or worrisome lesion.  Other: There has been marked progression of degenerative change about the proximal tib-fib joint with extensive subchondral cyst formation now seen. Synovial cyst extending posteriorly and superiorly out of the joint measures 1.4 cm craniocaudal by 0.8 cm AP by 1.3 cm transverse.  IMPRESSION: Progressive complex tearing posterior horn medial meniscus extends into the meniscal body in a horizontal orientation reaching the meniscal undersurface.  Mucoid degeneration the ACL and PCL without tear.  Large Baker's cyst has markedly increased in size since the prior exam.  Marked progression of advanced degenerative change about the proximal tib-fib joint.   Electronically Signed By: Debby Prader M.D. On: 01/17/2016 09:01  DG Knee Complete 4 Views Left  Narrative CLINICAL DATA:  Fall with left knee pain.  EXAM: LEFT KNEE - COMPLETE 4+ VIEW  COMPARISON:  None Available.  FINDINGS: Left knee arthroplasty. No acute or periprosthetic fracture. No periprosthetic  lucency. There has been patellar resurfacing. Minimal knee joint effusion. No erosive change. Unremarkable soft tissues.  IMPRESSION: Left knee arthroplasty without complication or acute fracture.   Electronically Signed By: Andrea Gasman M.D. On: 11/04/2022 15:46   Complexity Note: Imaging results reviewed.                         ROS   Cardiovascular: No reported cardiovascular signs or symptoms such as High blood pressure, coronary artery disease, abnormal heart rate or rhythm, heart attack, blood thinner therapy or heart weakness and/or failure Pulmonary or Respiratory: Lung problems Neurological: No reported neurological signs or symptoms such as seizures, abnormal skin sensations, urinary and/or fecal incontinence, being born with an abnormal open spine and/or a tethered spinal cord Psychological-Psychiatric: No reported psychological or psychiatric signs or symptoms such as difficulty sleeping, anxiety, depression, delusions or hallucinations (schizophrenial), mood swings (bipolar disorders) or suicidal ideations or attempts Gastrointestinal: Irregular, infrequent bowel movements (Constipation) Genitourinary: No reported renal or genitourinary signs or symptoms such as difficulty voiding or producing urine, peeing blood, non-functioning kidney, kidney stones, difficulty emptying the bladder, difficulty controlling the flow of urine, or chronic kidney disease Hematological: Brusing easily Endocrine: No reported endocrine signs or symptoms such as high or low blood sugar, rapid heart rate due to high thyroid  levels, obesity or weight gain due to slow thyroid  or thyroid  disease Rheumatologic: No reported rheumatological signs and symptoms such as fatigue, joint pain, tenderness, swelling, redness, heat, stiffness, decreased range of motion, with or without associated rash Musculoskeletal: Negative for myasthenia gravis, muscular dystrophy, multiple sclerosis or malignant hyperthermia Work History: Retired  Allergies  Mr. Pauwels is allergic to simvastatin and oxycodone .  Laboratory Chemistry Profile   Renal Lab Results  Component Value Date   BUN 16 07/18/2023   CREATININE 1.00 07/18/2023   LABCREA 136 07/21/2023   BCR SEE NOTE: 07/18/2023   GFR 71.80 12/27/2022   GFRAA 85 03/21/2019   GFRNONAA >60 11/05/2022    SPECGRAV 1.010 04/27/2011   PHUR 6.0 04/27/2011   PROTEINUR Neg 04/27/2011     Electrolytes Lab Results  Component Value Date   NA 130 (L) 07/18/2023   K 5.0 07/18/2023   CL 98 07/18/2023   CALCIUM  8.7 07/18/2023   MG 1.9 08/17/2022   PHOS 2.9 02/04/2016     Hepatic Lab Results  Component Value Date   AST 17 11/05/2022   ALT 14 11/05/2022   ALBUMIN 3.5 11/05/2022   ALKPHOS 64 11/05/2022   LIPASE 27 09/06/2020     ID Lab Results  Component Value Date   SARSCOV2NAA Not Detected 03/03/2020   STAPHAUREUS NEGATIVE 09/06/2020   MRSAPCR NEGATIVE 09/06/2020     Bone Lab Results  Component Value Date   VD25OH 48.0 11/15/2018     Endocrine Lab Results  Component Value Date   GLUCOSE 110 (H) 07/18/2023   GLUCOSEU NEGATIVE 12/31/2010   HGBA1C 6.9 (H) 07/18/2023   TSH 1.52 07/18/2023   FREET4 1.6 07/18/2023   CRTSLPL 6.1 06/16/2021     Neuropathy Lab Results  Component Value Date   VITAMINB12 875 12/27/2022   FOLATE >20.0 08/22/2017   HGBA1C 6.9 (H) 07/18/2023     CNS No results found for: COLORCSF, APPEARCSF, RBCCOUNTCSF, WBCCSF, POLYSCSF, LYMPHSCSF, EOSCSF, PROTEINCSF, GLUCCSF, JCVIRUS, CSFOLI, IGGCSF, LABACHR, ACETBL   Inflammation (CRP: Acute  ESR: Chronic) Lab Results  Component Value Date   CRP 1.2 04/22/2017   ESRSEDRATE 22 (H) 10/20/2021  Rheumatology Lab Results  Component Value Date   RF <14 10/20/2021   LABURIC 3.9 (L) 10/20/2021     Coagulation Lab Results  Component Value Date   INR 1.02 12/31/2010   LABPROT 13.6 12/31/2010   APTT 35 12/31/2010   PLT 305.0 12/27/2022   DDIMER 0.45 10/03/2017     Cardiovascular Lab Results  Component Value Date   CKTOTAL 55 08/17/2022   CKMB 5.5 (H) 08/03/2010   TROPONINI <0.01 08/03/2010   HGB 11.6 (L) 12/27/2022   HCT 35.8 (L) 12/27/2022     Screening Lab Results  Component Value Date   SARSCOV2NAA Not Detected 03/03/2020   STAPHAUREUS NEGATIVE 09/06/2020    MRSAPCR NEGATIVE 09/06/2020     Cancer No results found for: CEA, CA125, LABCA2   Allergens No results found for: ALMOND, APPLE, ASPARAGUS, AVOCADO, BANANA, BARLEY, BASIL, BAYLEAF, GREENBEAN, LIMABEAN, WHITEBEAN, BEEFIGE, REDBEET, BLUEBERRY, BROCCOLI, CABBAGE, MELON, CARROT, CASEIN, CASHEWNUT, CAULIFLOWER, CELERY     Note: Lab results reviewed.  PFSH  Drug: Mr. Meader  reports no history of drug use. Alcohol :  reports that he does not currently use alcohol . Tobacco:  reports that he quit smoking about 57 years ago. His smoking use included cigarettes. He started smoking about 75 years ago. He has a 18 pack-year smoking history. He has never been exposed to tobacco smoke. He has never used smokeless tobacco. Medical:  has a past medical history of Arthritis, Cataract (2010), Constipation due to pain medication, Diabetes mellitus without complication (HCC), Enlarged prostate, Frequent urination at night, GERD (gastroesophageal reflux disease), History of noncompliance with medical treatment, presenting hazards to health (11/20/2018), Hyperlipidemia associated with type 2 diabetes mellitus (HCC) (04/19/2007), Hypertension, Hypothyroidism, Polio, Snoring, Statin declined-  (11/20/2018), Torn meniscus, and Transfusion history (2012). Family: family history includes Asthma in his brother; Bone cancer in his brother; Coronary artery disease in his mother; Diabetes in his paternal grandmother; Diverticulosis in his mother; Emphysema in his mother; Sarcoidosis in his son; Sudden death in his father; Thyroid  disease in his daughter and mother.  Past Surgical History:  Procedure Laterality Date   ANTERIOR LAT LUMBAR FUSION Right 11/03/2017   Procedure: Right Lumbar three-four Anterolateral lumbar interbody fusion with lateral plate;  Surgeon: Unice Pac, MD;  Location: [facility] OR;  Service: Neurosurgery;  Laterality: Right;   BACK SURGERY  2011    hemiarthroplasty 01/11/1999 and 6   CARDIAC CATHETERIZATION  09/2001   negative   COLONOSCOPY     COLONOSCOPY N/A 05/06/2022   Procedure: COLONOSCOPY;  Surgeon: Unk Corinn Skiff, MD;  Location: [facility] ENDOSCOPY;  Service: Gastroenterology;  Laterality: N/A;   COLONOSCOPY WITH PROPOFOL  N/A 05/05/2022   Procedure: COLONOSCOPY WITH PROPOFOL ;  Surgeon: Unk Corinn Skiff, MD;  Location: [facility] ENDOSCOPY;  Service: Gastroenterology;  Laterality: N/A;   EYE SURGERY  2003   R&L cataract removed & IOL- 2003 & 2007,Dr.Jenkins   INGUINAL HERNIA REPAIR  2002   right, Dr. Honore   JOINT REPLACEMENT  2012   left knee replacement   LIPOMA EXCISION     back   MENISCECTOMY Bilateral    Dr. Jane   PROSTATE SURGERY  07/2006   for urinary urgency, Dr. Alline FRIED CUFF REPAIR Right 11/2014   sinus & throat surgery-1995  1995   SPINE SURGERY  2005   TONSILLECTOMY     TOTAL KNEE ARTHROPLASTY  01/11/2011   Procedure: TOTAL KNEE ARTHROPLASTY;  Surgeon: Garnette JONETTA Raman, MD;  Location: [facility] OR;  Service: Orthopedics;  Laterality: Left;  TOTAL KNEE ARTHROPLASTY Right 11/22/2016   Procedure: TOTAL KNEE ARTHROPLASTY;  Surgeon: Rubie Kemps, MD;  Location: [facility] OR;  Service: Orthopedics;  Laterality: Right;   UVULECTOMY  1996   Dr. Arlana   VASECTOMY     WRIST SURGERY  1959   fracture- right   XI ROBOTIC ASSISTED VENTRAL HERNIA N/A 09/07/2020   Procedure: XI ROBOTIC ASSISTED DIAGNOSTIC LAPAROSCOPY;  Surgeon: Jordis Laneta FALCON, MD;  Location: [facility] ORS;  Service: General;  Laterality: N/A;   Active Ambulatory Problems    Diagnosis Date Noted   Hypothyroidism 04/20/2006   Hyperlipidemia associated with type 2 diabetes mellitus (HCC) 04/19/2007   SENILE KERATOSIS 06/12/2008   BPH (benign prostatic hyperplasia) 04/18/2007   GERD (gastroesophageal reflux disease) 03/26/2011   Anemia, unspecified 08/13/2012   Diverticulosis of colon without hemorrhage 01/22/2013   Spondylolisthesis of lumbar region  10/23/2013   Hypertension associated with diabetes (HCC) 03/20/2014   Chronic rhinitis 11/01/2014   Type 2 diabetes mellitus with other specified complication (HCC) 08/05/2015   Hypomagnesemia 12/29/2015   Trochanteric bursitis of right hip 09/07/2016   ETD (Eustachian tube dysfunction), right 09/15/2016   Arthralgia of right temporomandibular joint 03/02/2017   Chronic eczematous otitis externa of both ears 03/02/2017   Presbycusis of both ears 03/02/2017   Status post bilateral knee replacements 04/14/2017   DM assoc with CKD (chronic kidney disease), stage III (HCC) 04/14/2017   Lumbar radiculopathy 06/16/2017   Chronic pain of both knees 08/22/2017   Aortic atherosclerosis (HCC) 10/05/2017   Elevated coronary artery calcium  score 11/25/2018   Dyslipidemia 11/30/2020   Balance problems 12/17/2020   Memory loss 11/26/2021   Poor dentition 04/02/2022   Tinnitus of both ears 04/09/2022   Elevated fecal calprotectin 05/06/2022   Chronic synovitis 01/18/2020   Thoracic aorta atherosclerosis (HCC) 12/01/2022   Calcium  blood decreased 12/01/2022   Cerebral infarction, chronic 12/01/2022   Muscle spasm 12/01/2022   Physical debility 12/01/2022   Weakness of both lower extremities 12/01/2022   Unstable gait 12/01/2022   History of fall 12/01/2022   Multiple falls 12/28/2022   Reactive depression 12/28/2022   Hyponatremia 12/28/2022   Obstruction of sinonasal region 03/10/2023   Chronic pansinusitis 03/10/2023   Persistent dry cough 09/09/2023   Chronic painful diabetic neuropathy (HCC) 10/06/2023   Chronic pain syndrome 10/06/2023   Lumbar post-laminectomy syndrome 10/06/2023   Resolved Ambulatory Problems    Diagnosis Date Noted   Diet-controlled type 2 diabetes mellitus (HCC) 04/19/2007   ADVEF, DRUG/MEDICINAL/BIOLOGICAL SUBST NOS 10/25/2006   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   SNORING, HX OF 04/19/2007   HYPOKALEMIA, MILD 02/13/2010   Abnormal CK-MB 08/10/2010   Chest pain  08/10/2010   Synovial cyst of lumbar spine 07/26/2011   Spasms of the hands or feet 07/26/2011   B12 deficiency anemia 01/22/2013   Diarrhea 06/21/2015   Flatulence symptom 07/25/2015   Constipation 08/06/2015   Cerumen impaction 08/25/2015   h/o Hyponatremia 12/29/2015   Cough 01/04/2016   High risk medications (not anticoagulants) long-term use 01/04/2016   Age-related osteoporosis without current pathological fracture 07/16/2016   History of lumbar fusion 07/16/2016   History of total knee arthroplasty, left 07/16/2016   Strain of masseter muscle 09/15/2016   H/O total knee replacement, bilateral 11/22/2016   Lethargy 08/22/2017   Stressful life event affecting family-loss on May 31st 08/22/2017   Inactivity-recently due to knee pain 08/22/2017   History of noncompliance with medical treatment, presenting hazards to health 11/20/2018   Flatulence 11/29/2018  SIADH (syndrome of inappropriate ADH production) (HCC) 03/21/2019   Educated about COVID-19 virus infection 11/29/2019   Thoracic aortic aneurysm (HCC) 12/10/2019   Hyperkalemia 02/05/2020   Shortness of breath 02/05/2020   SBO (small bowel obstruction) (HCC) 09/06/2020   Chronic pain of left ankle 12/17/2020   Abnormal stool caliber 11/26/2021   Disorder of bone density and structure, unspecified 01/15/2022   Nasal discharge 04/02/2022   Skin eruption 04/09/2022   Chronic diarrhea 04/27/2022   Bilateral impacted cerumen 03/02/2017   Complete tear of right rotator cuff 12/03/2014   Chronic pain of right lower extremity 07/08/2022   Obstruction of sinonasal region 07/13/2022   Chronic pansinusitis 07/13/2022   Rib pain on right side 07/13/2022   Quadriceps weakness 08/17/2022   Muscle spasm 08/17/2022   Fall 11/04/2022   Closed fracture of multiple pubic rami, left, initial encounter (HCC) 12/01/2022   Past Medical History:  Diagnosis Date   Arthritis    Cataract 2010   Constipation due to pain medication     Diabetes mellitus without complication (HCC)    Enlarged prostate    Frequent urination at night    Hypertension    Polio    Snoring    Statin declined-  11/20/2018   Torn meniscus    Transfusion history 2012   Constitutional Exam  General appearance: Well nourished, well developed, and well hydrated. In no apparent acute distress Vitals:   10/06/23 0923  BP: (!) 149/82  Pulse: (!) 53  Resp: 16  Temp: (!) 97.2 F (36.2 C)  TempSrc: Temporal  SpO2: 100%  Weight: 181 lb (82.1 kg)  Height: 5' 10 (1.778 m)   BMI Assessment: Estimated body mass index is 25.97 kg/m as calculated from the following:   Height as of this encounter: 5' 10 (1.778 m).   Weight as of this encounter: 181 lb (82.1 kg).  BMI interpretation table: BMI level Category Range association with higher incidence of chronic pain  <18 kg/m2 Underweight   18.5-24.9 kg/m2 Ideal body weight   25-29.9 kg/m2 Overweight Increased incidence by 20%  30-34.9 kg/m2 Obese (Class I) Increased incidence by 68%  35-39.9 kg/m2 Severe obesity (Class II) Increased incidence by 136%  >40 kg/m2 Extreme obesity (Class III) Increased incidence by 254%   Patient's current BMI Ideal Body weight  Body mass index is 25.97 kg/m. Ideal body weight: 73 kg (160 lb 15 oz) Adjusted ideal body weight: 76.6 kg (168 lb 15.4 oz)   BMI Readings from Last 4 Encounters:  10/06/23 25.97 kg/m  09/21/23 27.15 kg/m  09/13/23 25.97 kg/m  09/09/23 26.54 kg/m   Wt Readings from Last 4 Encounters:  10/06/23 181 lb (82.1 kg)  09/21/23 189 lb 3.2 oz (85.8 kg)  09/13/23 181 lb (82.1 kg)  09/09/23 185 lb (83.9 kg)    Psych/Mental status: Alert, oriented x 3 (person, place, & time)       Eyes: PERLA Respiratory: No evidence of acute respiratory distress  Upper Extremity (UE) Exam    Side: Right upper extremity  Side: Left upper extremity  Skin & Extremity Inspection: Skin color, temperature, and hair growth are WNL. No peripheral edema or  cyanosis. No masses, redness, swelling, asymmetry, or associated skin lesions. No contractures.  Skin & Extremity Inspection: Skin color, temperature, and hair growth are WNL. No peripheral edema or cyanosis. No masses, redness, swelling, asymmetry, or associated skin lesions. No contractures.  Functional ROM: Unrestricted ROM          Functional  ROM: Pain restricted ROM for shoulder  Muscle Tone/Strength: Functionally intact. No obvious neuro-muscular anomalies detected.  Muscle Tone/Strength: Functionally intact. No obvious neuro-muscular anomalies detected.  Sensory (Neurological): Unimpaired          Sensory (Neurological): Arthropathic arthralgia          Palpation: No palpable anomalies              Palpation: No palpable anomalies              Provocative Test(s):  Phalen's test: deferred Tinel's test: deferred Apley's scratch test (touch opposite shoulder):  Action 1 (Across chest): deferred Action 2 (Overhead): deferred Action 3 (LB reach): deferred   Provocative Test(s):  Phalen's test: deferred Tinel's test: deferred Apley's scratch test (touch opposite shoulder):  Action 1 (Across chest): Decreased ROM Action 2 (Overhead): Decreased ROM Action 3 (LB reach): Decreased ROM     Lumbar Spine Area Exam  Skin & Axial Inspection: Well healed scar from previous spine surgery detected Alignment: Symmetrical Functional ROM: Pain restricted ROM affecting both sides Stability: No instability detected Muscle Tone/Strength: Functionally intact. No obvious neuro-muscular anomalies detected. Sensory (Neurological): Neurogenic pain pattern  Gait & Posture Assessment  Ambulation: Unassisted Gait: Relatively normal for age and body habitus Posture: WNL  Lower Extremity Exam    Side: Right lower extremity  Side: Left lower extremity  Stability: No instability observed          Stability: No instability observed          Skin & Extremity Inspection: Evidence of prior arthroplastic surgery  discoloration of feet  Skin & Extremity Inspection: Evidence of prior arthroplastic surgery discoloration of feet  Functional ROM: Pain restricted ROM feet          Functional ROM: Pain restricted ROM feet          Muscle Tone/Strength: Functionally intact. No obvious neuro-muscular anomalies detected.  Muscle Tone/Strength: Functionally intact. No obvious neuro-muscular anomalies detected.  Sensory (Neurological): Neurogenic pain pattern        Sensory (Neurological): Neurogenic pain pattern        DTR: Patellar: deferred today Achilles: deferred today Plantar: deferred today  DTR: Patellar: deferred today Achilles: deferred today Plantar: deferred today  Palpation: No palpable anomalies  Palpation: No palpable anomalies    Assessment  Primary Diagnosis & Pertinent Problem List: The primary encounter diagnosis was Chronic painful diabetic neuropathy (HCC). Diagnoses of Lumbar post-laminectomy syndrome, Primary osteoarthritis of left shoulder, Chronic pain of both knees, Status post bilateral knee replacements, Chronic pain syndrome, Left rotator cuff tear arthropathy, and Arthritis of left glenohumeral joint were also pertinent to this visit.  Visit Diagnosis (New problems to examiner): 1. Chronic painful diabetic neuropathy (HCC)   2. Lumbar post-laminectomy syndrome   3. Primary osteoarthritis of left shoulder   4. Chronic pain of both knees   5. Status post bilateral knee replacements   6. Chronic pain syndrome   7. Left rotator cuff tear arthropathy   8. Arthritis of left glenohumeral joint    Plan of Care (Initial workup plan)  Assessment and Plan    Type 2 diabetes mellitus with diabetic polyneuropathy and associated foot pain   He experiences chronic neuropathic pain in both feet, mainly affecting the left toe complex, worsened when lying flat, likely due to spinal stenosis. Gabapentin  300 mg caused significant side effects, while 100 mg was better tolerated but still caused  mild dizziness. Nortriptyline  is not favored due to potential side effects in  the geriatric population. Hydrocodone  provides relief at night. Qutenza, a capsaicin 8% wrap, was discussed as a treatment option for diabetic neuropathy to reduce nerve pain. Spinal cord stimulation was mentioned as a more invasive option but is not currently recommended. Qutenza is indicated for nerve pain related to shingles or diabetes and is generally covered by insurance. The treatment involves a 30-40 minute application every three months for the first year. Administer Qutenza treatment for neuropathic pain. Continue gabapentin  100 mg at bedtime and consider increasing to 100 mg twice daily if tolerated. Obtain a urine sample for opioid therapy consideration. Consider low dose hydrocodone  for nighttime pain relief. Schedule follow-up for Qutenza treatment.  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 Tobie. A crusty scab-like material was recently removed, with minor bleeding that did not soak through the Band-Aid.  Chronic left shoulder pain, possible rotator cuff tear   He suffers from chronic left shoulder pain, possibly due to a rotator cuff tear. Previous x-rays did not reveal significant findings. Pain is exacerbated by certain movements, suggesting possible rotator cuff involvement. An MRI is needed to confirm the diagnosis as x-rays do not show soft tissue injuries like rotator cuff tears. Order an MRI of the left shoulder and review MRI results during follow-up for Qutenza treatment.        Lab Orders         Compliance Drug Analysis, Ur     Imaging Orders         MR SHOULDER LEFT WO CONTRAST      Procedure Orders         NEUROLYSIS      Provider-requested follow-up: Return in about 13 days (around 10/19/2023) for Qutenza.  Future Appointments  Date Time Provider Department Center  10/18/2023  3:45 PM Tobie Franky SQUIBB, DPM [facility]  10/19/2023 10:00 AM Marcelino Nurse, MD [facility] None  12/19/2023  9:15 AM [facility] None  12/21/2023  1:40 PM Thapa, [place], MD [facility] None  02/06/2024  9:30 AM Alvia Norleen BIRCH, MD [facility] None  09/13/2024  8:10 AM [facility] ANNUAL WELLNESS VISIT 1 [facility]  09/20/2024 10:00 AM Deveshwar, Maya, MD [facility] None   I discussed the assessment and treatment plan with the patient. The patient was provided an opportunity to ask questions and all were answered. The patient agreed with the plan and demonstrated an understanding of the instructions.  Patient advised to call back or seek an in-person evaluation if the symptoms or condition worsens.  Duration of encounter: .  Total time on encounter, as per AMA guidelines included both the face-to-face and non-face-to-face time personally spent by the physician and/or other qualified health care professional(s) on the day of the encounter (includes time in activities that require the physician or other qualified health care professional and does not include time in activities normally performed by clinical staff). Physician's time may include the following activities when performed: Preparing to see the patient (e.g., pre-charting review of records, searching for previously ordered imaging, lab work, and nerve conduction tests) Review of prior analgesic pharmacotherapies. Reviewing PMP Interpreting ordered tests (e.g., lab work, imaging, nerve conduction tests) Performing post-procedure evaluations, including interpretation of diagnostic procedures Obtaining and/or reviewing separately obtained history Performing a medically appropriate examination and/or evaluation Counseling and educating the patient/family/caregiver Ordering medications, tests, or procedures Referring and communicating with other health care professionals (when not separately  reported) Documenting clinical  information in the electronic or other health record Independently interpreting results (not separately reported) and communicating results to the patient/ family/caregiver Care coordination (not separately reported)  Note by: Wallie Sherry, MD (TTS and AI technology used. I apologize for any typographical errors that were not detected and corrected.) Date: 10/06/2023; Time: 1:15 PM

## 2023-10-10 DIAGNOSIS — R2681 Unsteadiness on feet: Secondary | ICD-10-CM | POA: Diagnosis not present

## 2023-10-10 DIAGNOSIS — R3914 Feeling of incomplete bladder emptying: Secondary | ICD-10-CM | POA: Diagnosis not present

## 2023-10-10 DIAGNOSIS — M6281 Muscle weakness (generalized): Secondary | ICD-10-CM | POA: Diagnosis not present

## 2023-10-10 DIAGNOSIS — N401 Enlarged prostate with lower urinary tract symptoms: Secondary | ICD-10-CM | POA: Diagnosis not present

## 2023-10-10 LAB — COMPLIANCE DRUG ANALYSIS, UR

## 2023-10-12 DIAGNOSIS — M6281 Muscle weakness (generalized): Secondary | ICD-10-CM | POA: Diagnosis not present

## 2023-10-12 DIAGNOSIS — R2681 Unsteadiness on feet: Secondary | ICD-10-CM | POA: Diagnosis not present

## 2023-10-14 ENCOUNTER — Ambulatory Visit
Admission: RE | Admit: 2023-10-14 | Discharge: 2023-10-14 | Disposition: A | Discharge status: Home / Self Care | Source: Ambulatory Visit | Attending: Student in an Organized Health Care Education/Training Program | Admitting: Student in an Organized Health Care Education/Training Program

## 2023-10-14 DIAGNOSIS — M75102 Unspecified rotator cuff tear or rupture of left shoulder, not specified as traumatic: Secondary | ICD-10-CM | POA: Diagnosis not present

## 2023-10-14 DIAGNOSIS — M12812 Other specific arthropathies, not elsewhere classified, left shoulder: Secondary | ICD-10-CM | POA: Insufficient documentation

## 2023-10-14 DIAGNOSIS — M19012 Primary osteoarthritis, left shoulder: Secondary | ICD-10-CM | POA: Insufficient documentation

## 2023-10-17 DIAGNOSIS — R2681 Unsteadiness on feet: Secondary | ICD-10-CM | POA: Diagnosis not present

## 2023-10-17 DIAGNOSIS — M6281 Muscle weakness (generalized): Secondary | ICD-10-CM | POA: Diagnosis not present

## 2023-10-18 ENCOUNTER — Ambulatory Visit (INDEPENDENT_AMBULATORY_CARE_PROVIDER_SITE_OTHER): Admitting: Podiatry

## 2023-10-18 DIAGNOSIS — L97522 Non-pressure chronic ulcer of other part of left foot with fat layer exposed: Secondary | ICD-10-CM

## 2023-10-18 NOTE — Progress Notes (Signed)
 Subjective:  Patient ID: Kyle Simon, male    DOB: 18-Jul-1932,  MRN: [id number]  Chief Complaint  Patient presents with   Foot Ulcer    Left foot toe ulcer     89 y.o. male presents for wound care.  Patient presents with left hallux dorsal ulceration recurrence.  He states his started noticing coming back again.  Denies any other acute complaints.   Review of Systems: Negative except as noted in the HPI. Denies N/V/F/Ch.  Past Medical History:  Diagnosis Date   Arthritis    both hands;Dr.Deveshwar   Cataract 2010   Constipation due to pain medication    Diabetes mellitus without complication (HCC)    Type II. Uses no medication. Pt controls with diet and weight   Enlarged prostate    Frequent urination at night    GERD (gastroesophageal reflux disease)    History of noncompliance with medical treatment, presenting hazards to health 11/20/2018   Hyperlipidemia associated with type 2 diabetes mellitus (HCC) 04/19/2007   Qualifier: Diagnosis of  By: Tish MD, Elsie     Hypertension    Hypothyroidism    Polio    as a child w/o complications   Snoring    no sleep apnea   Statin declined-  11/20/2018   patient understands risks associated with this decision and has been advised to restart by cardiologist as well as myself several times   Torn meniscus    Transfusion history 2012   post TKR    Current Outpatient Medications:    celecoxib  (CELEBREX ) 100 MG capsule, Take 100 mg by mouth 2 (two) times daily., Disp: , Rfl:    diclofenac  Sodium (VOLTAREN ) 1 % GEL, Apply 2 g topically at bedtime., Disp: 100 g, Rfl: 2   doxycycline  (VIBRA -TABS) 100 MG tablet, Take 1 tablet (100 mg total) by mouth 2 (two) times daily. (Patient not taking: Reported on 10/06/2023), Disp: 20 tablet, Rfl: 0   fexofenadine  (ALLEGRA  ALLERGY) 180 MG tablet, Take 1 tablet (180 mg total) by mouth daily. (Patient not taking: Reported on 10/06/2023), Disp: 90 tablet, Rfl: 0   furosemide  (LASIX ) 20 MG tablet,  TAKE 1 TABLET (20 MG TOTAL) BY MOUTH DAILY. TAKE 1 TABLET (20 MG TOTAL) BY MOUTH DAILY FOR 7 DAYS., Disp: 90 tablet, Rfl: 2   gabapentin  (NEURONTIN ) 100 MG capsule, Take 1 capsule (100 mg total) by mouth 3 (three) times daily., Disp: 90 capsule, Rfl: 3   HYDROcodone -acetaminophen  (NORCO/VICODIN) 5-325 MG tablet, Take 1 tablet by mouth every 6 (six) hours as needed for moderate pain (pain score 4-6)., Disp: 30 tablet, Rfl: 0   Hypromellose (ARTIFICIAL TEARS OP), Place 1 drop into both eyes 2 (two) times daily., Disp: , Rfl:    Multiple Vitamin (MULTIVITAMIN PO), Take by mouth daily., Disp: , Rfl:    nortriptyline  (PAMELOR ) 10 MG capsule, Take 10 mg by mouth at bedtime., Disp: , Rfl:    Omega-3 Fatty Acids (FISH OIL) 1000 MG CAPS, Take 1,000 mg by mouth every evening., Disp: , Rfl:    omeprazole  (PRILOSEC) 20 MG capsule, TAKE 1 CAPSULE BY MOUTH EVERY DAY, Disp: 90 capsule, Rfl: 1   rosuvastatin  (CRESTOR ) 5 MG tablet, TAKE 1 TABLET AT BEDTIME, Disp: 90 tablet, Rfl: 1   SYNTHROID  137 MCG tablet, TAKE 1 TABLET DAILY BEFORE BREAKFAST, Disp: 90 tablet, Rfl: 3   tamsulosin  (FLOMAX ) 0.4 MG CAPS capsule, TAKE 1 CAPSULE TWICE A DAY, Disp: 180 capsule, Rfl: 3   traMADol  (ULTRAM ) 50 MG tablet,  Take 1 tablet (50 mg total) by mouth every 8 (eight) hours as needed for moderate pain (pain score 4-6)., Disp: 20 tablet, Rfl: 0   valsartan  (DIOVAN ) 320 MG tablet, TAKE 1 TABLET BY MOUTH EVERY DAY, Disp: 90 tablet, Rfl: 3  Social History   Tobacco Use  Smoking Status Former   Current packs/day: 0.00   Average packs/day: 1 pack/day for 18.0 years (18.0 ttl pk-yrs)   Types: Cigarettes   Start date: 03/01/1948   Quit date: 03/01/1966   Years since quitting: 57.6   Passive exposure: Never  Smokeless Tobacco Never    Allergies  Allergen Reactions   Simvastatin Other (See Comments)    MYALGIAS   Oxycodone  Other (See Comments)    Severe constipation   Objective:  There were no vitals filed for this visit. There  is no height or weight on file to calculate BMI. Constitutional Well developed. Well nourished.  Vascular Dorsalis pedis pulses palpable bilaterally. Posterior tibial pulses palpable bilaterally. Capillary refill normal to all digits.  No cyanosis or clubbing noted. Pedal hair growth normal.  Neurologic Normal speech. Oriented to person, place, and time. Protective sensation absent  Dermatologic Wound Location: Left dorsal toe ulceration fat layer exposed does not probe down to deep tissue or bone.  No clinical signs of infection noted no redness noted. Wound Base: Mixed Granular/Fibrotic Peri-wound: Calloused Exudate: Scant/small amount Serous exudate Wound Measurements: - See below  Orthopedic: No pain to palpation either foot.   Radiographs: None   Assessment:   1. Toe ulcer, left, with fat layer exposed (HCC)     Plan:  Patient was evaluated and treated and all questions answered.   Ulcer left dorsal toe ulceration with fat layer exposed~recurrence -Debridement as below. -Dressed with triple antibiotic Band-Aid, DSD. -Continue off-loading with surgical shoe. - ABIs PVRs were within normal limits no further intervention indicated - Patient does not currently smoke -  Procedure: Excisional Debridement of Wound Tool: Sharp chisel blade/tissue nipper Rationale: Removal of non-viable soft tissue from the wound to promote healing.  Anesthesia: none Pre-Debridement Wound Measurements: 0.3 cm x 0.3 cm x 0.3 cm Post-Debridement Wound Measurements: 0.5 cm x 0.5 cm x 0.3 cm this is Type of Debridement: Sharp Excisional Tissue Removed: Non-viable soft tissue Blood loss: Minimal (<50cc) Depth of Debridement: subcutaneous tissue. Technique: Sharp excisional debridement to bleeding, viable wound base.  Wound Progress: T my initial evaluation since recurrence Site healing conversation 7 Dressing: Dry, sterile, compression dressing. Disposition: Patient tolerated procedure  well. Patient to return in 1 week for follow-up.  No follow-ups on file.

## 2023-10-19 ENCOUNTER — Encounter: Payer: Self-pay | Admitting: Student in an Organized Health Care Education/Training Program

## 2023-10-19 ENCOUNTER — Other Ambulatory Visit: Payer: Self-pay | Admitting: Podiatry

## 2023-10-19 ENCOUNTER — Telehealth: Payer: Self-pay | Admitting: Student in an Organized Health Care Education/Training Program

## 2023-10-19 ENCOUNTER — Ambulatory Visit
Attending: Student in an Organized Health Care Education/Training Program | Admitting: Student in an Organized Health Care Education/Training Program

## 2023-10-19 VITALS — BP 97/70 | HR 72 | Temp 97.1°F | Resp 18 | Ht 70.0 in | Wt 181.0 lb

## 2023-10-19 DIAGNOSIS — M75102 Unspecified rotator cuff tear or rupture of left shoulder, not specified as traumatic: Secondary | ICD-10-CM | POA: Insufficient documentation

## 2023-10-19 DIAGNOSIS — E1140 Type 2 diabetes mellitus with diabetic neuropathy, unspecified: Secondary | ICD-10-CM | POA: Diagnosis not present

## 2023-10-19 DIAGNOSIS — M12812 Other specific arthropathies, not elsewhere classified, left shoulder: Secondary | ICD-10-CM | POA: Insufficient documentation

## 2023-10-19 DIAGNOSIS — G894 Chronic pain syndrome: Secondary | ICD-10-CM | POA: Diagnosis not present

## 2023-10-19 DIAGNOSIS — M19012 Primary osteoarthritis, left shoulder: Secondary | ICD-10-CM | POA: Diagnosis not present

## 2023-10-19 MED ORDER — TRAMADOL HCL 50 MG PO TABS: 50.0000 mg | ORAL_TABLET | Freq: Two times a day (BID) | ORAL | 2 refills | Status: AC | PRN

## 2023-10-19 MED ORDER — CAPSAICIN-CLEANSING GEL 8 % EX KIT
4.0000 | PACK | Freq: Once | CUTANEOUS | Status: AC
  Administered 2023-10-19: 4 via TOPICAL

## 2023-10-19 NOTE — Telephone Encounter (Signed)
 Wife is asking to speak with Nurse about pregablin that Dr Marcelino recommended patient to start taking. Has question about the dosage.

## 2023-10-19 NOTE — Patient Instructions (Signed)

## 2023-10-19 NOTE — Progress Notes (Signed)
 PROVIDER NOTE: Interpretation of information contained herein should be left to medically-trained personnel. Specific patient instructions are provided elsewhere under Patient Instructions section of medical record. This document was created in part using STT-dictation technology, any transcriptional errors that may result from this process are unintentional.  Patient: Kyle Simon Type: Established DOB: [birth year] MRN: [id number] PCP: Corwin Antu, FNP  Service: Procedure DOS: 10/19/2023 Setting: Ambulatory Location: Ambulatory outpatient facility Delivery: Face-to-face Provider: Wallie Sherry, MD Specialty: Interventional Pain Management Specialty designation: 09 Location: Outpatient facility Ref. Prov.: Corwin Antu, FNP       Interventional Therapy   Type: Qutenza  Neurolysis #1  Laterality:  Bilateral Area treated: Feet Imaging Guidance: None Anesthesia/analgesia/anxiolysis/sedation: None required Medication (Right): Qutenza  (capsaicin  8%) topical system Medication (Left): Qutenza  (capsaicin  8%) topical system Date: 10/19/2023 Performed by: Wallie Sherry, MD Rationale (medical necessity): procedure needed and proper for the treatment of Kyle Simon's medical symptoms and needs. Indication: Painful diabetic peripheral neuralgia (DPN) (ICD-10-CM:E11.40) severe enough to impact quality of life or function. 1. Chronic painful diabetic neuropathy (HCC)   2. Left rotator cuff tear arthropathy   3. Arthritis of left glenohumeral joint   4. Chronic pain syndrome    NAS-11 Pain score:   Pre-procedure: 8 /10   Post-procedure: 8 /10     Position / Prep / Materials:  Position: Supine  Materials: Qutenza  Kit (4 patches)  H&P (Pre-op Assessment):  Kyle Simon is a 89 y.o. (year old), male patient, seen today for interventional treatment. He  has a past surgical history that includes Uvulectomy (1996); Inguinal hernia repair (2002); Tonsillectomy; Prostate surgery (07/2006); Vasectomy;  Lipoma excision; Wrist surgery (8040); sinus & throat surgery-1995 (1995); Meniscectomy (Bilateral); Eye surgery (2003); Back surgery (2011); Total knee arthroplasty (01/11/2011); Joint replacement (2012); Cardiac catheterization (09/2001); Rotator cuff repair (Right, 11/2014); Total knee arthroplasty (Right, 11/22/2016); Colonoscopy; Anterior lat lumbar fusion (Right, 11/03/2017); XI robotic assisted ventral hernia (N/A, 09/07/2020); Colonoscopy with propofol  (N/A, 05/05/2022); Colonoscopy (N/A, 05/06/2022); and Spine surgery (2005). Kyle Simon has a current medication list which includes the following prescription(s): celecoxib , diclofenac  sodium, furosemide , gabapentin , carboxymethylcellulose sodium, multiple vitamin, nortriptyline , fish oil, omeprazole , rosuvastatin , synthroid , tamsulosin , valsartan , doxycycline , fexofenadine , and tramadol , and the following Facility-Administered Medications: capsaicin  topical system. His primarily concern today is the Foot Pain (bilateral)  Initial Vital Signs:  Pulse/HCG Rate: 72  Temp: (!) 97.1 F (36.2 C) Resp: 18 BP: 97/70 SpO2: 100 %  BMI: Estimated body mass index is 25.97 kg/m as calculated from the following:   Height as of this encounter: 5' 10 (1.778 m).   Weight as of this encounter: 181 lb (82.1 kg).  Risk Assessment: Allergies: Reviewed. He is allergic to simvastatin and oxycodone .  Allergy Precautions: None required Coagulopathies: Reviewed. None identified.  Blood-thinner therapy: None at this time Active Infection(s): Reviewed. None identified. Kyle Simon is afebrile  Site Confirmation: Kyle Simon was asked to confirm the procedure and laterality before marking the site Procedure checklist: Completed Consent: Before the procedure and under the influence of no sedative(s), amnesic(s), or anxiolytics, the patient was informed of the treatment options, risks and possible complications. To fulfill our ethical and legal obligations, as  recommended by the American Medical Association's Code of Ethics, I have informed the patient of my clinical impression; the nature and purpose of the treatment or procedure; the risks, benefits, and possible complications of the intervention; the alternatives, including doing nothing; the risk(s) and benefit(s) of the alternative treatment(s) or procedure(s); and the risk(s) and benefit(s) of doing nothing.  The patient was provided information about the general risks and possible complications associated with the procedure. These may include, but are not limited to: failure to achieve desired goals, infection, bleeding, organ or nerve damage, allergic reactions, paralysis, and death. In addition, the patient was informed of those risks and complications associated to the procedure, such as failure to decrease pain; infection; bleeding; organ or nerve damage with subsequent damage to sensory, motor, and/or autonomic systems, resulting in permanent pain, numbness, and/or weakness of one or several areas of the body; allergic reactions; (i.e.: anaphylactic reaction); and/or death. Furthermore, the patient was informed of those risks and complications associated with the medications. These include, but are not limited to: allergic reactions (i.e.: anaphylactic or anaphylactoid reaction(s)); adrenal axis suppression; blood sugar elevation that in diabetics may result in ketoacidosis or comma; water  retention that in patients with history of congestive heart failure may result in shortness of breath, pulmonary edema, and decompensation with resultant heart failure; weight gain; swelling or edema; medication-induced neural toxicity; particulate matter embolism and blood vessel occlusion with resultant organ, and/or nervous system infarction; and/or aseptic necrosis of one or more joints. Finally, the patient was informed that Medicine is not an exact science; therefore, there is also the possibility of unforeseen or  unpredictable risks and/or possible complications that may result in a catastrophic outcome. The patient indicated having understood very clearly. We have given the patient no guarantees and we have made no promises. Enough time was given to the patient to ask questions, all of which were answered to the patient's satisfaction. Kyle Simon has indicated that he wanted to continue with the procedure. Attestation: I, the ordering provider, attest that I have discussed with the patient the benefits, risks, side-effects, alternatives, likelihood of achieving goals, and potential problems during recovery for the procedure that I have provided informed consent. Date  Time: 10/19/2023  9:46 AM  Pre-Procedure Preparation:  Monitoring: As per clinic protocol. Respiration, ETCO2, SpO2, BP, heart rate and rhythm monitor placed and checked for adequate function Safety Precautions: Patient was assessed for positional comfort and pressure points before starting the procedure. Time-out: I initiated and conducted the Time-out before starting the procedure, as per protocol. The patient was asked to participate by confirming the accuracy of the Time Out information. Verification of the correct person, site, and procedure were performed and confirmed by me, the nursing staff, and the patient. Time-out conducted as per Joint Commission's Universal Protocol (UP.01.01.01). Time: 1011 Start Time: 1026 hrs.  Description/Narrative of Procedure:          Region: Distal lower extremities Target Area: Sensory peripheral nerves affected by diabetic peripheral neuropathy Site: Feet Approach: Percutaneous  No./Series: Not applicable  Type: Percutaneous  Purpose: Therapeutic  Region: Distal lower extremities  Start Time: 1026 hrs.  Description of the Procedure: Protocol guidelines were followed. The patient was assisted into a comfortable position.  Informed consent was obtained in the patient monitored in the usual  manner.  All questions were answered prior to the procedure.  They Qutenza  patches were applied to the affected area and then covered with the wrap.  The Patient was kept under observation until the treatment was completed.  The patches were removed and the treated area was inspected.  Vitals:   10/19/23 1002  BP: 97/70  Pulse: 72  Resp: 18  Temp: (!) 97.1 F (36.2 C)  TempSrc: Temporal  SpO2: 100%  Weight: 181 lb (82.1 kg)  Height: 5' 10 (1.778 m)  End Time: 1106 hrs.  Type of Imaging Technique: None used Indication(s): N/A Exposure Time: No patient exposure Contrast: None used. Fluoroscopic Guidance: N/A Ultrasound Guidance: N/A Interpretation: N/A  Post-operative Assessment:  Post-procedure Vital Signs:  Pulse/HCG Rate: 72  Temp: (!) 97.1 F (36.2 C) Resp: 18 BP: 97/70 SpO2: 100 %  EBL: None  Complications: No immediate post-treatment complications observed by team, or reported by patient.  Note: The patient tolerated the entire procedure well. A repeat set of vitals were taken after the procedure and the patient was kept under observation following institutional policy, for this type of procedure. Post-procedural neurological assessment was performed, showing return to baseline, prior to discharge. The patient was provided with post-procedure discharge instructions, including a section on how to identify potential problems. Should any problems arise concerning this procedure, the patient was given instructions to immediately contact us , at any time, without hesitation. In any case, we plan to contact the patient by telephone for a follow-up status report regarding this interventional procedure.  Comments:  No additional relevant information.  Plan of Care (POC)  EXAM DESCRIPTION: MR SHOULDER LEFT WO CONTRAST   CLINICAL HISTORY: 89 years, Male, Shoulder pain, chronic, bursitis suspected, xray done; Shoulder pain, chronic, erosive osteoarthritis suspected, xray  done; severe left shoulder pain, degenerative change on xray, suspect rotator cuff tear/arthropathy   COMPARISON: None   TECHNIQUE: MRI of the shoulder was performed with multiplanar multi sequence imaging according to our usual protocol.   FINDINGS: There is a large full-thickness tear of the supraspinatus and infraspinatus tendons with fibers near the glenoid rim. Mild muscle belly atrophy. Mild to moderate bursal fluid. The subscapularis and teres minor tendons are unremarkable.   No significant joint effusion. Mild degenerative tear to the posterior labrum. There is a complete tear of the proximal biceps tendon retracted distally. Mild acromioclavicular osteoarthritis.   IMPRESSION: Large retracted full-thickness tears of the supraspinatus and infraspinatus tendons. Mild muscle belly atrophy.   Complete tear of the proximal biceps tendon retracted distally.   Mild degenerative tear to the posterior labrum.   Mild acromioclavicular osteoarthritis.   Electronically signed by: Reyes Frees MD 10/14/2023 10:39 AM EDT RP Workstation: [id number]  I reviewed the patient's left shoulder MRI with him.  He has a full-thickness tear of his supraspinatus and infraspinatus resulting in rotator cuff arthropathy.  He has severely limited range of motion of his left shoulder along with significant pain.  We discussed a diagnostic suprascapular nerve block and then consideration of peripheral nerve stimulation or radiofrequency ablation of the left suprascapular nerve.  In regards to medication management, patient states that he does not tolerate hydrocodone  well.  He does take tramadol  50 mg at night.  PMP checked and reviewed.  Refill of tramadol  as below, 50 mg twice daily as needed.  He states that gabapentin  makes him dizzy.  We discussed a trial of Lyrica  25 mg twice daily as needed.  He states that he already has a prescription of Lyrica  at home and will try that.  Future considerations  include Cymbalta   Orders:  Orders Placed This Encounter  Procedures   SUPRASCAPULAR NERVE BLOCK    For shoulder pain.    Standing Status:   Future    Expected Date:   11/16/2023    Expiration Date:   10/18/2024    Scheduling Instructions:     Purpose: LEFT     Level(s): Suprascapular notch     Sedation: without     Scheduling Timeframe:  As permitted by the schedule    Where will this procedure be performed?:   [facility]     Medications ordered for procedure: Meds ordered this encounter  Medications   traMADol  (ULTRAM ) 50 MG tablet    Sig: Take 1 tablet (50 mg total) by mouth every 12 (twelve) hours as needed for moderate pain (pain score 4-6).    Dispense:  60 tablet    Refill:  2   capsaicin  topical system 8 % patch 4 patch    Disregard order if transmitted to pharmacy. Qutenza  Kit [facility].   Medications administered: Chidubem Chaires. Lafuente Bob had no medications administered during this visit.  See the medical record for exact dosing, route, and time of administration.    Qutenza  10/19/2023, plan for left suprascapular nerve block for significant left rotator cuff arthropathy    Follow-up plan:   Return in about 26 days (around 11/14/2023) for Left SSNB , in clinic [facility].     Recent Visits Date Type Provider Dept  10/06/23 Office Visit Marcelino Nurse, MD [facility]  Showing recent visits within past 90 days and meeting all other requirements Today's Visits Date Type Provider Dept  10/19/23 Procedure visit Marcelino Nurse, MD [facility]  Showing today's visits and meeting all other requirements Future Appointments Date Type Provider Dept  11/14/23 Appointment Marcelino Nurse, MD [facility]  Showing future appointments within next 90 days and meeting all other requirements   Disposition: Discharge home  Discharge (Date  Time): 10/19/2023; 1120 hrs.   Primary Care Physician: Corwin Antu,  FNP Location: [facility] Note by: Nurse Marcelino, MD (TTS technology used. I apologize for any typographical errors that were not detected and corrected.) Date: 10/19/2023; Time: 11:36 AM  Disclaimer:  Medicine is not an Visual merchandiser. The only guarantee in medicine is that nothing is guaranteed. It is important to note that the decision to proceed with this intervention was based on the information collected from the patient. The Data and conclusions were drawn from the patient's questionnaire, the interview, and the physical examination. Because the information was provided in large part by the patient, it cannot be guaranteed that it has not been purposely or unconsciously manipulated. Every effort has been made to obtain as much relevant data as possible for this evaluation. It is important to note that the conclusions that lead to this procedure are derived in large part from the available data. Always take into account that the treatment will also be dependent on availability of resources and existing treatment guidelines, considered by other Pain Management Practitioners as being common knowledge and practice, at the time of the intervention. For Medico-Legal purposes, it is also important to point out that variation in procedural techniques and pharmacological choices are the acceptable norm. The indications, contraindications, technique, and results of the above procedure should only be interpreted and judged by a Board-Certified Interventional Pain Specialist with extensive familiarity and expertise in the same exact procedure and technique.

## 2023-10-19 NOTE — Telephone Encounter (Signed)
 Attempted to call, voicemail left.   Was instructed at today's appt to take Lyrica  25 mg bid prn.  He already has the medication at home.

## 2023-10-20 ENCOUNTER — Telehealth: Payer: Self-pay

## 2023-10-20 NOTE — Telephone Encounter (Signed)
 Post procedure follow up.  Patient complaining of excruciating pain.  Feels like a bad sunburn. ARea red.  Called Bob from Qutenza  and he states they can use otc Lidocaine  cream and ice and that it should start feeling beeter within 24 hours.  Wife explained all of this.

## 2023-10-24 DIAGNOSIS — R2681 Unsteadiness on feet: Secondary | ICD-10-CM | POA: Diagnosis not present

## 2023-10-24 DIAGNOSIS — M6281 Muscle weakness (generalized): Secondary | ICD-10-CM | POA: Diagnosis not present

## 2023-10-27 DIAGNOSIS — R2681 Unsteadiness on feet: Secondary | ICD-10-CM | POA: Diagnosis not present

## 2023-10-27 DIAGNOSIS — M6281 Muscle weakness (generalized): Secondary | ICD-10-CM | POA: Diagnosis not present

## 2023-11-03 DIAGNOSIS — R2681 Unsteadiness on feet: Secondary | ICD-10-CM | POA: Diagnosis not present

## 2023-11-03 DIAGNOSIS — M6281 Muscle weakness (generalized): Secondary | ICD-10-CM | POA: Diagnosis not present

## 2023-11-07 DIAGNOSIS — H40053 Ocular hypertension, bilateral: Secondary | ICD-10-CM | POA: Diagnosis not present

## 2023-11-08 ENCOUNTER — Ambulatory Visit (INDEPENDENT_AMBULATORY_CARE_PROVIDER_SITE_OTHER): Admitting: Podiatry

## 2023-11-08 DIAGNOSIS — L97522 Non-pressure chronic ulcer of other part of left foot with fat layer exposed: Secondary | ICD-10-CM | POA: Diagnosis not present

## 2023-11-08 NOTE — Progress Notes (Signed)
 Subjective:  Patient ID: Kyle Simon, male    DOB: 07-23-1932,  MRN: [id number]  Chief Complaint  Patient presents with   Toe Pain    Patient has no complaints. He feels his toes is healing well    89 y.o. male presents for wound care.  Patient presents with left hallux dorsal ulceration recurrence he states he is doing better has completely healed denies any other acute complaints   Review of Systems: Negative except as noted in the HPI. Denies N/V/F/Ch.  Past Medical History:  Diagnosis Date   Arthritis    both hands;Dr.Deveshwar   Cataract 2010   Constipation due to pain medication    Diabetes mellitus without complication (HCC)    Type II. Uses no medication. Pt controls with diet and weight   Enlarged prostate    Frequent urination at night    GERD (gastroesophageal reflux disease)    History of noncompliance with medical treatment, presenting hazards to health 11/20/2018   Hyperlipidemia associated with type 2 diabetes mellitus (HCC) 04/19/2007   Qualifier: Diagnosis of  By: Tish MD, Elsie     Hypertension    Hypothyroidism    Polio    as a child w/o complications   Snoring    no sleep apnea   Statin declined-  11/20/2018   patient understands risks associated with this decision and has been advised to restart by cardiologist as well as myself several times   Torn meniscus    Transfusion history 2012   post TKR    Current Outpatient Medications:    celecoxib  (CELEBREX ) 100 MG capsule, Take 100 mg by mouth 2 (two) times daily., Disp: , Rfl:    diclofenac  Sodium (VOLTAREN ) 1 % GEL, Apply 2 g topically at bedtime., Disp: 100 g, Rfl: 2   doxycycline  (VIBRA -TABS) 100 MG tablet, Take 1 tablet (100 mg total) by mouth 2 (two) times daily. (Patient not taking: Reported on 10/06/2023), Disp: 20 tablet, Rfl: 0   fexofenadine  (ALLEGRA  ALLERGY) 180 MG tablet, Take 1 tablet (180 mg total) by mouth daily. (Patient not taking: Reported on 10/19/2023), Disp: 90 tablet, Rfl:  0   furosemide  (LASIX ) 20 MG tablet, TAKE 1 TABLET (20 MG TOTAL) BY MOUTH DAILY. TAKE 1 TABLET (20 MG TOTAL) BY MOUTH DAILY FOR 7 DAYS., Disp: 90 tablet, Rfl: 2   gabapentin  (NEURONTIN ) 100 MG capsule, Take 1 capsule (100 mg total) by mouth 3 (three) times daily., Disp: 90 capsule, Rfl: 3   Hypromellose (ARTIFICIAL TEARS OP), Place 1 drop into both eyes 2 (two) times daily., Disp: , Rfl:    Multiple Vitamin (MULTIVITAMIN PO), Take by mouth daily., Disp: , Rfl:    nortriptyline  (PAMELOR ) 10 MG capsule, Take 10 mg by mouth at bedtime., Disp: , Rfl:    Omega-3 Fatty Acids (FISH OIL) 1000 MG CAPS, Take 1,000 mg by mouth every evening., Disp: , Rfl:    omeprazole  (PRILOSEC) 20 MG capsule, TAKE 1 CAPSULE BY MOUTH EVERY DAY, Disp: 90 capsule, Rfl: 1   rosuvastatin  (CRESTOR ) 5 MG tablet, TAKE 1 TABLET AT BEDTIME, Disp: 90 tablet, Rfl: 1   SYNTHROID  137 MCG tablet, TAKE 1 TABLET DAILY BEFORE BREAKFAST, Disp: 90 tablet, Rfl: 3   tamsulosin  (FLOMAX ) 0.4 MG CAPS capsule, TAKE 1 CAPSULE TWICE A DAY, Disp: 180 capsule, Rfl: 3   traMADol  (ULTRAM ) 50 MG tablet, Take 1 tablet (50 mg total) by mouth every 12 (twelve) hours as needed for moderate pain (pain score 4-6)., Disp: 60 tablet, Rfl:  2   valsartan  (DIOVAN ) 320 MG tablet, TAKE 1 TABLET BY MOUTH EVERY DAY, Disp: 90 tablet, Rfl: 3  Social History   Tobacco Use  Smoking Status Former   Current packs/day: 0.00   Average packs/day: 1 pack/day for 18.0 years (18.0 ttl pk-yrs)   Types: Cigarettes   Start date: 03/01/1948   Quit date: 03/01/1966   Years since quitting: 57.7   Passive exposure: Never  Smokeless Tobacco Never    Allergies  Allergen Reactions   Simvastatin Other (See Comments)    MYALGIAS   Oxycodone  Other (See Comments)    Severe constipation   Objective:  There were no vitals filed for this visit. There is no height or weight on file to calculate BMI. Constitutional Well developed. Well nourished.  Vascular Dorsalis pedis pulses  palpable bilaterally. Posterior tibial pulses palpable bilaterally. Capillary refill normal to all digits.  No cyanosis or clubbing noted. Pedal hair growth normal.  Neurologic Normal speech. Oriented to person, place, and time. Protective sensation absent  Dermatologic Left dorsal toe ulceration completely epithelialized no further signs of ulcer noted no other abnormalities noted.  Orthopedic: No pain to palpation either foot.   Radiographs: None   Assessment:   No diagnosis found.   Plan:  Patient was evaluated and treated and all questions answered.   Ulcer left dorsal toe ulceration with fat layer exposed~recurrence Clinically healed and officially discharged from our care if any foot and ankle issues are in the future hurts with ambulation worse with pressure.  I discussed prevention technique if any foot and ankle issues in the future he will come back and see me. No follow-ups on file.

## 2023-11-09 DIAGNOSIS — R2681 Unsteadiness on feet: Secondary | ICD-10-CM | POA: Diagnosis not present

## 2023-11-09 DIAGNOSIS — M6281 Muscle weakness (generalized): Secondary | ICD-10-CM | POA: Diagnosis not present

## 2023-11-11 DIAGNOSIS — M6281 Muscle weakness (generalized): Secondary | ICD-10-CM | POA: Diagnosis not present

## 2023-11-11 DIAGNOSIS — R2681 Unsteadiness on feet: Secondary | ICD-10-CM | POA: Diagnosis not present

## 2023-11-14 ENCOUNTER — Encounter: Payer: Self-pay | Admitting: Student in an Organized Health Care Education/Training Program

## 2023-11-14 ENCOUNTER — Ambulatory Visit
Admission: RE | Admit: 2023-11-14 | Discharge: 2023-11-14 | Disposition: A | Discharge status: Home / Self Care | Source: Ambulatory Visit | Attending: Student in an Organized Health Care Education/Training Program | Admitting: Student in an Organized Health Care Education/Training Program

## 2023-11-14 ENCOUNTER — Ambulatory Visit: Admitting: Student in an Organized Health Care Education/Training Program

## 2023-11-14 VITALS — BP 147/92 | HR 73 | Temp 97.3°F | Resp 16 | Ht 70.0 in | Wt 181.0 lb

## 2023-11-14 DIAGNOSIS — M12812 Other specific arthropathies, not elsewhere classified, left shoulder: Secondary | ICD-10-CM | POA: Diagnosis not present

## 2023-11-14 DIAGNOSIS — M75102 Unspecified rotator cuff tear or rupture of left shoulder, not specified as traumatic: Secondary | ICD-10-CM | POA: Diagnosis not present

## 2023-11-14 DIAGNOSIS — M19012 Primary osteoarthritis, left shoulder: Secondary | ICD-10-CM | POA: Diagnosis not present

## 2023-11-14 MED ORDER — ROPIVACAINE HCL 2 MG/ML IJ SOLN
INTRAMUSCULAR | Status: AC
  Filled 2023-11-14: qty 20

## 2023-11-14 MED ORDER — IOHEXOL 180 MG/ML  SOLN
10.0000 mL | Freq: Once | INTRAMUSCULAR | Status: AC
  Administered 2023-11-14: 10 mL via INTRA_ARTICULAR

## 2023-11-14 MED ORDER — IOPAMIDOL (ISOVUE-M 200) INJECTION 41%: 10.0000 mL | Freq: Once | INTRAMUSCULAR | Status: DC

## 2023-11-14 MED ORDER — IOHEXOL 180 MG/ML  SOLN
INTRAMUSCULAR | Status: AC
  Filled 2023-11-14: qty 20

## 2023-11-14 MED ORDER — DEXAMETHASONE SODIUM PHOSPHATE 10 MG/ML IJ SOLN
10.0000 mg | Freq: Once | INTRAMUSCULAR | Status: AC
  Administered 2023-11-14: 10 mg

## 2023-11-14 MED ORDER — DEXAMETHASONE SODIUM PHOSPHATE 10 MG/ML IJ SOLN
INTRAMUSCULAR | Status: AC
  Filled 2023-11-14: qty 1

## 2023-11-14 MED ORDER — ROPIVACAINE HCL 2 MG/ML IJ SOLN
4.0000 mL | Freq: Once | INTRAMUSCULAR | Status: AC
  Administered 2023-11-14: 4 mL via INTRA_ARTICULAR

## 2023-11-14 MED ORDER — LIDOCAINE HCL (PF) 2 % IJ SOLN
INTRAMUSCULAR | Status: AC
  Filled 2023-11-14: qty 10

## 2023-11-14 NOTE — Progress Notes (Signed)
 Safety precautions to be maintained throughout the outpatient stay will include: orient to surroundings, keep bed in low position, maintain call bell within reach at all times, provide assistance with transfer out of bed and ambulation.

## 2023-11-14 NOTE — Progress Notes (Signed)
 PROVIDER NOTE: Interpretation of information contained herein should be left to medically-trained personnel. Specific patient instructions are provided elsewhere under Patient Instructions section of medical record. This document was created in part using STT-dictation technology, any transcriptional errors that may result from this process are unintentional.  Patient: Kyle Simon Type: Established DOB: 1932/11/22 MRN: [id number] PCP: Corwin Antu, FNP  Service: Procedure DOS: 11/14/2023 Setting: Ambulatory Location: Ambulatory outpatient facility Delivery: Face-to-face Provider: Wallie Sherry, MD Specialty: Interventional Pain Management Specialty designation: 09 Location: Outpatient facility Ref. Prov.: Corwin Antu, FNP       Interventional Therapy   Procedure: Suprascapular nerve block (SSNB) #1  Laterality:  Left  Level: Superior to scapular spine, lateral to supraspinatus fossa (Suprascapular notch).  Imaging: Fluoroscopic guidance         DOS: 11/14/2023  Performed by: Wallie Sherry, MD  Purpose: Diagnostic/Therapeutic Indications: Shoulder pain, severe enough to impact quality of life and/or function. 1. Left rotator cuff tear arthropathy   2. Arthritis of left glenohumeral joint    NAS-11 score:   Pre-procedure: 8 /10   Post-procedure: 8 /10     Target: Suprascapular nerve Location: midway between the medial border of the scapula and the acromion as it runs through the suprascapular notch. Region: Suprascapular, posterior shoulder  Approach: Percutaneous  Neuroanatomy: The suprascapular nerve is the lateral branch of the superior trunk of the brachial plexus. It receives nerve fibers that originate in the nerve roots C5 and C6 (and sometimes C4). It is a mixed nerve, meaning that it provides both sensory and motor supply for the suprascapular region. Function: The main function of this nerve is to provide motor innervation for two muscles, the supraspinatus and  infraspinatus muscles. They are part of the rotator cuff muscles. In addition, the suprascapular nerve provides a sensory supply to the joints of the scapula (glenohumeral and acromioclavicular joints). Rationale (medical necessity): procedure needed and proper for the diagnosis and/or treatment of the patient's medical symptoms and needs.  Position / Prep / Materials:  Position: Prone Materials:  Tray: Block Needle(s):  Type: Spinal  Gauge (G): 22  Length: 3.5 in.  Qty: 1 Prep solution: ChloraPrep (2% chlorhexidine  gluconate and 70% isopropyl alcohol ) Prep Area: Entire posterior shoulder area. From upper spine to shoulder proper (upper arm), and from lateral neck to lower tip of shoulder blade.   H&P (Pre-op Assessment):  Kyle Simon is a 89 y.o. (year old), male patient, seen today for interventional treatment. He  has a past surgical history that includes Uvulectomy (1996); Inguinal hernia repair (2002); Tonsillectomy; Prostate surgery (07/2006); Vasectomy; Lipoma excision; Wrist surgery (8040); sinus & throat surgery-1995 (1995); Meniscectomy (Bilateral); Eye surgery (2003); Back surgery (2011); Total knee arthroplasty (01/11/2011); Joint replacement (2012); Cardiac catheterization (09/2001); Rotator cuff repair (Right, 11/2014); Total knee arthroplasty (Right, 11/22/2016); Colonoscopy; Anterior lat lumbar fusion (Right, 11/03/2017); XI robotic assisted ventral hernia (N/A, 09/07/2020); Colonoscopy with propofol  (N/A, 05/05/2022); Colonoscopy (N/A, 05/06/2022); and Spine surgery (2005). Kyle Simon has a current medication list which includes the following prescription(s): furosemide , gabapentin , carboxymethylcellulose sodium, multiple vitamin, fish oil, omeprazole , rosuvastatin , synthroid , tamsulosin , tramadol , valsartan , celecoxib , diclofenac  sodium, doxycycline , fexofenadine , and nortriptyline , and the following Facility-Administered Medications: iopamidol . His primarily concern today is the  Shoulder Pain (left)  Initial Vital Signs:  Pulse/HCG Rate: 73ECG Heart Rate: 69 Temp: (!) 97.3 F (36.3 C) Resp: 16 BP: 115/76 SpO2: 90 %  BMI: Estimated body mass index is 25.97 kg/m as calculated from the following:   Height as of this  encounter: 5' 10 (1.778 m).   Weight as of this encounter: 181 lb (82.1 kg).  Risk Assessment: Allergies: Reviewed. He is allergic to simvastatin and oxycodone .  Allergy Precautions: None required Coagulopathies: Reviewed. None identified.  Blood-thinner therapy: None at this time Active Infection(s): Reviewed. None identified. Kyle Simon is afebrile  Site Confirmation: Kyle Simon was asked to confirm the procedure and laterality before marking the site Procedure checklist: Completed Consent: Before the procedure and under the influence of no sedative(s), amnesic(s), or anxiolytics, the patient was informed of the treatment options, risks and possible complications. To fulfill our ethical and legal obligations, as recommended by the American Medical Association's Code of Ethics, I have informed the patient of my clinical impression; the nature and purpose of the treatment or procedure; the risks, benefits, and possible complications of the intervention; the alternatives, including doing nothing; the risk(s) and benefit(s) of the alternative treatment(s) or procedure(s); and the risk(s) and benefit(s) of doing nothing. The patient was provided information about the general risks and possible complications associated with the procedure. These may include, but are not limited to: failure to achieve desired goals, infection, bleeding, organ or nerve damage, allergic reactions, paralysis, and death. In addition, the patient was informed of those risks and complications associated to the procedure, such as failure to decrease pain; infection; bleeding; organ or nerve damage with subsequent damage to sensory, motor, and/or autonomic systems, resulting in permanent  pain, numbness, and/or weakness of one or several areas of the body; allergic reactions; (i.e.: anaphylactic reaction); and/or death. Furthermore, the patient was informed of those risks and complications associated with the medications. These include, but are not limited to: allergic reactions (i.e.: anaphylactic or anaphylactoid reaction(s)); adrenal axis suppression; blood sugar elevation that in diabetics may result in ketoacidosis or comma; water  retention that in patients with history of congestive heart failure may result in shortness of breath, pulmonary edema, and decompensation with resultant heart failure; weight gain; swelling or edema; medication-induced neural toxicity; particulate matter embolism and blood vessel occlusion with resultant organ, and/or nervous system infarction; and/or aseptic necrosis of one or more joints. Finally, the patient was informed that Medicine is not an exact science; therefore, there is also the possibility of unforeseen or unpredictable risks and/or possible complications that may result in a catastrophic outcome. The patient indicated having understood very clearly. We have given the patient no guarantees and we have made no promises. Enough time was given to the patient to ask questions, all of which were answered to the patient's satisfaction. Kyle Simon has indicated that he wanted to continue with the procedure. Attestation: I, the ordering provider, attest that I have discussed with the patient the benefits, risks, side-effects, alternatives, likelihood of achieving goals, and potential problems during recovery for the procedure that I have provided informed consent. Date  Time: 11/14/2023 12:34 PM  Pre-Procedure Preparation:  Monitoring: As per clinic protocol. Respiration, ETCO2, SpO2, BP, heart rate and rhythm monitor placed and checked for adequate function Safety Precautions: Patient was assessed for positional comfort and pressure points before starting  the procedure. Time-out: I initiated and conducted the Time-out before starting the procedure, as per protocol. The patient was asked to participate by confirming the accuracy of the Time Out information. Verification of the correct person, site, and procedure were performed and confirmed by me, the nursing staff, and the patient. Time-out conducted as per Joint Commission's Universal Protocol (UP.01.01.01). Time: 1311 Start Time: 1311 hrs.  Description of Procedure:  Procedural Technique Safety Precautions: Aspiration looking for blood return was conducted prior to all injections. At no point did we inject any substances, as a needle was being advanced. No attempts were made at seeking any paresthesias. Safe injection practices and needle disposal techniques used. Medications properly checked for expiration dates. SDV (single dose vial) medications used. Description of the Procedure: Protocol guidelines were followed. The patient was placed in position over the procedure table. The target area was identified and the area prepped in the usual manner. Skin & deeper tissues infiltrated with local anesthetic. Appropriate amount of time allowed to pass for local anesthetics to take effect. The procedure needles were then advanced to the target area. Proper needle placement secured. Negative aspiration confirmed. Solution injected in intermittent fashion, asking for systemic symptoms every 0.5cc of injectate. The needles were then removed and the area cleansed, making sure to leave some of the prepping solution back to take advantage of its long term bactericidal properties.  Vitals:   11/14/23 1241 11/14/23 1309 11/14/23 1315  BP: 115/76 (!) 157/92 (!) 147/92  Pulse: 73    Resp: 16 18 16   Temp: (!) 97.3 F (36.3 C)    SpO2: 90% 97% 98%  Weight: 181 lb (82.1 kg)    Height: 5' 10 (1.778 m)       Start Time: 1311 hrs. End Time: 1314 hrs.  Imaging Guidance (Spinal):         Type of  Imaging Technique: Fluoroscopy Guidance (Spinal) Indication(s): Fluoroscopy guidance for needle placement to enhance accuracy in procedures requiring precise needle localization for targeted delivery of medication in or near specific anatomical locations not easily accessible without such real-time imaging assistance. Exposure Time: Please see nurses notes. Contrast: Before injecting any contrast, we confirmed that the patient did not have an allergy to iodine, shellfish, or radiological contrast. Once satisfactory needle placement was completed at the desired level, radiological contrast was injected. Contrast injected under live fluoroscopy. No contrast complications. See chart for type and volume of contrast used. Fluoroscopic Guidance: I was personally present during the use of fluoroscopy. Tunnel Vision Technique used to obtain the best possible view of the target area. Parallax error corrected before commencing the procedure. Direction-depth-direction technique used to introduce the needle under continuous pulsed fluoroscopy. Once target was reached, antero-posterior, oblique, and lateral fluoroscopic projection used confirm needle placement in all planes. Images permanently stored in EMR. Interpretation: I personally interpreted the imaging intraoperatively. Adequate needle placement confirmed in multiple planes. Appropriate spread of contrast into desired area was observed. No evidence of afferent or efferent intravascular uptake. No intrathecal or subarachnoid spread observed. Permanent images saved into the patient's record.  Antibiotic Prophylaxis:   Anti-infectives (From admission, onward)    None      Indication(s): None identified  Post-operative Assessment:  Post-procedure Vital Signs:  Pulse/HCG Rate: 7370 Temp: (!) 97.3 F (36.3 C) Resp: 16 BP: (!) 147/92 SpO2: 98 %  EBL: None  Complications: No immediate post-treatment complications observed by team, or reported by  patient.  Note: The patient tolerated the entire procedure well. A repeat set of vitals were taken after the procedure and the patient was kept under observation following institutional policy, for this type of procedure. Post-procedural neurological assessment was performed, showing return to baseline, prior to discharge. The patient was provided with post-procedure discharge instructions, including a section on how to identify potential problems. Should any problems arise concerning this procedure, the patient was given instructions to immediately contact us , at  any time, without hesitation. In any case, we plan to contact the patient by telephone for a follow-up status report regarding this interventional procedure.  Comments:  No additional relevant information.  Plan of Care (POC)  Orders:  Orders Placed This Encounter  Procedures   DG PAIN CLINIC C-ARM 1-60 MIN NO REPORT    Intraoperative interpretation by procedural physician at [facility].    Standing Status:   Standing    Number of Occurrences:   1    Reason for exam::   Assistance in needle guidance and placement for procedures requiring needle placement in or near specific anatomical locations not easily accessible without such assistance.    Medications ordered for procedure: Meds ordered this encounter  Medications   iohexol  (OMNIPAQUE ) 180 MG/ML injection 10 mL    Must be Myelogram-compatible. If not available, you may substitute with a water -soluble, non-ionic, hypoallergenic, myelogram-compatible radiological contrast medium.   iopamidol  (ISOVUE -M) 41 % intrathecal injection 10 mL    Must be Myelogram-compatible. If not available, you may substitute with a water -soluble, non-ionic, hypoallergenic, myelogram-compatible radiological contrast medium.   ropivacaine  (PF) 2 mg/mL (0.2%) (NAROPIN ) injection 4 mL   dexamethasone  (DECADRON ) injection 10 mg   Medications administered: We administered iohexol , ropivacaine   (PF) 2 mg/mL (0.2%), and dexamethasone .  See the medical record for exact dosing, route, and time of administration.    Qutenza  10/19/2023, left suprascapular nerve block: 11/14/23     Follow-up plan:   Return in about 4 weeks (around 12/12/2023) for PPE, F2F (post SSNB).     Recent Visits Date Type Provider Dept  10/19/23 Procedure visit Marcelino Nurse, MD [facility]  10/06/23 Office Visit Marcelino Nurse, MD [facility]  Showing recent visits within past 90 days and meeting all other requirements Today's Visits Date Type Provider Dept  11/14/23 Procedure visit Marcelino Nurse, MD [facility]  Showing today's visits and meeting all other requirements Future Appointments No visits were found meeting these conditions. Showing future appointments within next 90 days and meeting all other requirements   Disposition: Discharge home  Discharge (Date  Time): 11/14/2023; 1330 hrs.   Primary Care Physician: Corwin Antu, FNP Location: [facility] Note by: Nurse Marcelino, MD (TTS technology used. I apologize for any typographical errors that were not detected and corrected.) Date: 11/14/2023; Time: 1:18 PM  Disclaimer:  Medicine is not an Visual merchandiser. The only guarantee in medicine is that nothing is guaranteed. It is important to note that the decision to proceed with this intervention was based on the information collected from the patient. The Data and conclusions were drawn from the patient's questionnaire, the interview, and the physical examination. Because the information was provided in large part by the patient, it cannot be guaranteed that it has not been purposely or unconsciously manipulated. Every effort has been made to obtain as much relevant data as possible for this evaluation. It is important to note that the conclusions that lead to this procedure are derived in large part from the available data. Always take into  account that the treatment will also be dependent on availability of resources and existing treatment guidelines, considered by other Pain Management Practitioners as being common knowledge and practice, at the time of the intervention. For Medico-Legal purposes, it is also important to point out that variation in procedural techniques and pharmacological choices are the acceptable norm. The indications, contraindications, technique, and results of the above procedure should only be interpreted and  judged by a Board-Certified Interventional Pain Specialist with extensive familiarity and expertise in the same exact procedure and technique.

## 2023-11-14 NOTE — Patient Instructions (Signed)

## 2023-11-15 ENCOUNTER — Telehealth: Payer: Self-pay | Admitting: *Deleted

## 2023-11-15 NOTE — Telephone Encounter (Signed)
 Post procedure call; no questions or concerns. Doing very well.

## 2023-11-16 DIAGNOSIS — R2681 Unsteadiness on feet: Secondary | ICD-10-CM | POA: Diagnosis not present

## 2023-11-16 DIAGNOSIS — M6281 Muscle weakness (generalized): Secondary | ICD-10-CM | POA: Diagnosis not present

## 2023-11-21 ENCOUNTER — Telehealth: Payer: Self-pay | Admitting: Cardiology

## 2023-11-21 NOTE — Telephone Encounter (Signed)
 Patient is completely out of his carvedilol 12.5mg  and would like a refill sent to [facility] at [address]. CB # [id number]

## 2023-11-21 NOTE — Telephone Encounter (Signed)
 Disregard this, wrong patient selected.

## 2023-11-25 DIAGNOSIS — R2681 Unsteadiness on feet: Secondary | ICD-10-CM | POA: Diagnosis not present

## 2023-11-25 DIAGNOSIS — M6281 Muscle weakness (generalized): Secondary | ICD-10-CM | POA: Diagnosis not present

## 2023-11-28 DIAGNOSIS — M6281 Muscle weakness (generalized): Secondary | ICD-10-CM | POA: Diagnosis not present

## 2023-11-28 DIAGNOSIS — R2681 Unsteadiness on feet: Secondary | ICD-10-CM | POA: Diagnosis not present

## 2023-12-01 DIAGNOSIS — M6281 Muscle weakness (generalized): Secondary | ICD-10-CM | POA: Diagnosis not present

## 2023-12-01 DIAGNOSIS — R2681 Unsteadiness on feet: Secondary | ICD-10-CM | POA: Diagnosis not present

## 2023-12-07 DIAGNOSIS — R2681 Unsteadiness on feet: Secondary | ICD-10-CM | POA: Diagnosis not present

## 2023-12-07 DIAGNOSIS — M6281 Muscle weakness (generalized): Secondary | ICD-10-CM | POA: Diagnosis not present

## 2023-12-11 ENCOUNTER — Other Ambulatory Visit: Payer: Self-pay | Admitting: Family

## 2023-12-11 DIAGNOSIS — R053 Chronic cough: Secondary | ICD-10-CM

## 2023-12-12 DIAGNOSIS — M6281 Muscle weakness (generalized): Secondary | ICD-10-CM | POA: Diagnosis not present

## 2023-12-12 DIAGNOSIS — R2681 Unsteadiness on feet: Secondary | ICD-10-CM | POA: Diagnosis not present

## 2023-12-13 ENCOUNTER — Ambulatory Visit: Admitting: Student in an Organized Health Care Education/Training Program

## 2023-12-13 ENCOUNTER — Ambulatory Visit: Admitting: Podiatry

## 2023-12-13 DIAGNOSIS — M19072 Primary osteoarthritis, left ankle and foot: Secondary | ICD-10-CM | POA: Diagnosis not present

## 2023-12-13 NOTE — Progress Notes (Signed)
 Subjective:  Patient ID: Kyle Simon, male    DOB: December 19, 1932,  MRN: [id number]  Chief Complaint  Patient presents with   Toe Pain    89 y.o. male presents with the above complaint.  Patient presents with complaint of edema to the left second digit.  No open wounds or lesion he just wanted make sure that the ulceration is not coming back he wants to make sure the edema is not a problematic thing he is a type II diabetic.  He would like to discuss treatment options for this pain scale 7 out of 10 dull aching nature   Review of Systems: Negative except as noted in the HPI. Denies N/V/F/Ch.  Past Medical History:  Diagnosis Date   Arthritis    both hands;Dr.Deveshwar   Cataract 2010   Constipation due to pain medication    Diabetes mellitus without complication (HCC)    Type II. Uses no medication. Pt controls with diet and weight   Enlarged prostate    Frequent urination at night    GERD (gastroesophageal reflux disease)    History of noncompliance with medical treatment, presenting hazards to health 11/20/2018   Hyperlipidemia associated with type 2 diabetes mellitus (HCC) 04/19/2007   Qualifier: Diagnosis of  By: Tish MD, Elsie     Hypertension    Hypothyroidism    Polio    as a child w/o complications   Snoring    no sleep apnea   Statin declined-  11/20/2018   patient understands risks associated with this decision and has been advised to restart by cardiologist as well as myself several times   Torn meniscus    Transfusion history 2012   post TKR    Current Outpatient Medications:    celecoxib  (CELEBREX ) 100 MG capsule, Take 100 mg by mouth 2 (two) times daily., Disp: , Rfl:    diclofenac  Sodium (VOLTAREN ) 1 % GEL, Apply 2 g topically at bedtime., Disp: 100 g, Rfl: 2   doxycycline  (VIBRA -TABS) 100 MG tablet, Take 1 tablet (100 mg total) by mouth 2 (two) times daily. (Patient not taking: Reported on 11/14/2023), Disp: 20 tablet, Rfl: 0   fexofenadine  (ALLEGRA )  180 MG tablet, TAKE 1 TABLET BY MOUTH EVERY DAY, Disp: 90 tablet, Rfl: 1   furosemide  (LASIX ) 20 MG tablet, TAKE 1 TABLET (20 MG TOTAL) BY MOUTH DAILY. TAKE 1 TABLET (20 MG TOTAL) BY MOUTH DAILY FOR 7 DAYS. (Patient taking differently: Take by mouth daily as needed. Take 1 tablet (20 mg total) by mouth daily for 7 days.), Disp: 90 tablet, Rfl: 2   gabapentin  (NEURONTIN ) 100 MG capsule, Take 1 capsule (100 mg total) by mouth 3 (three) times daily. (Patient taking differently: Take 100 mg by mouth 3 (three) times daily as needed.), Disp: 90 capsule, Rfl: 3   Hypromellose (ARTIFICIAL TEARS OP), Place 1 drop into both eyes 2 (two) times daily., Disp: , Rfl:    Multiple Vitamin (MULTIVITAMIN PO), Take by mouth daily., Disp: , Rfl:    nortriptyline  (PAMELOR ) 10 MG capsule, Take 10 mg by mouth at bedtime. (Patient not taking: Reported on 11/14/2023), Disp: , Rfl:    Omega-3 Fatty Acids (FISH OIL) 1000 MG CAPS, Take 1,000 mg by mouth every evening., Disp: , Rfl:    omeprazole  (PRILOSEC) 20 MG capsule, TAKE 1 CAPSULE BY MOUTH EVERY DAY, Disp: 90 capsule, Rfl: 1   rosuvastatin  (CRESTOR ) 5 MG tablet, TAKE 1 TABLET AT BEDTIME, Disp: 90 tablet, Rfl: 1   SYNTHROID  137  MCG tablet, TAKE 1 TABLET DAILY BEFORE BREAKFAST, Disp: 90 tablet, Rfl: 3   tamsulosin  (FLOMAX ) 0.4 MG CAPS capsule, TAKE 1 CAPSULE TWICE A DAY, Disp: 180 capsule, Rfl: 3   traMADol  (ULTRAM ) 50 MG tablet, Take 1 tablet (50 mg total) by mouth every 12 (twelve) hours as needed for moderate pain (pain score 4-6)., Disp: 60 tablet, Rfl: 2   valsartan  (DIOVAN ) 320 MG tablet, TAKE 1 TABLET BY MOUTH EVERY DAY, Disp: 90 tablet, Rfl: 3  Social History   Tobacco Use  Smoking Status Former   Current packs/day: 0.00   Average packs/day: 1 pack/day for 18.0 years (18.0 ttl pk-yrs)   Types: Cigarettes   Start date: 03/01/1948   Quit date: 03/01/1966   Years since quitting: 57.8   Passive exposure: Never  Smokeless Tobacco Never    Allergies  Allergen  Reactions   Simvastatin Other (See Comments)    MYALGIAS   Oxycodone  Other (See Comments)    Severe constipation   Objective:  There were no vitals filed for this visit. There is no height or weight on file to calculate BMI. Constitutional Well developed. Well nourished.  Vascular Dorsalis pedis pulses palpable bilaterally. Posterior tibial pulses palpable bilaterally. Capillary refill normal to all digits.  No cyanosis or clubbing noted. Pedal hair growth normal.  Neurologic Normal speech. Oriented to person, place, and time. Epicritic sensation to light touch grossly present bilaterally.  Dermatologic Left hallux edema with some achiness.  Pain with range of motion of the interphalangeal joint mild crepitus noted  Orthopedic: Normal joint ROM without pain or crepitus bilaterally. No visible deformities. No bony tenderness.   Radiographs: None Assessment:   1. Degenerative arthritis of toe joint, left    Plan:  Patient was evaluated and treated and all questions answered.  Left hallux interphalangeal joint arthritis with underlying edema - All questions and concerns were discussed with the patient in extensive detail encouraged him to do elevation as well as compression socks to help with the edema he states understanding.  He will work on that immediately - If there is no improvement patient may need interphalangeal joint fusion in the future.  No follow-ups on file.

## 2023-12-14 DIAGNOSIS — R2681 Unsteadiness on feet: Secondary | ICD-10-CM | POA: Diagnosis not present

## 2023-12-14 DIAGNOSIS — M6281 Muscle weakness (generalized): Secondary | ICD-10-CM | POA: Diagnosis not present

## 2023-12-15 ENCOUNTER — Encounter: Payer: Self-pay | Admitting: Student in an Organized Health Care Education/Training Program

## 2023-12-15 ENCOUNTER — Ambulatory Visit
Attending: Student in an Organized Health Care Education/Training Program | Admitting: Student in an Organized Health Care Education/Training Program

## 2023-12-15 VITALS — BP 130/45 | HR 61 | Temp 97.2°F | Resp 16 | Ht 70.0 in | Wt 181.0 lb

## 2023-12-15 DIAGNOSIS — E1140 Type 2 diabetes mellitus with diabetic neuropathy, unspecified: Secondary | ICD-10-CM | POA: Diagnosis not present

## 2023-12-15 DIAGNOSIS — M19012 Primary osteoarthritis, left shoulder: Secondary | ICD-10-CM | POA: Diagnosis not present

## 2023-12-15 DIAGNOSIS — M75102 Unspecified rotator cuff tear or rupture of left shoulder, not specified as traumatic: Secondary | ICD-10-CM | POA: Insufficient documentation

## 2023-12-15 DIAGNOSIS — G894 Chronic pain syndrome: Secondary | ICD-10-CM | POA: Diagnosis not present

## 2023-12-15 DIAGNOSIS — M12812 Other specific arthropathies, not elsewhere classified, left shoulder: Secondary | ICD-10-CM | POA: Diagnosis not present

## 2023-12-15 NOTE — Patient Instructions (Signed)
 GENERAL RISKS AND COMPLICATIONS  What are the risk, side effects and possible complications? Generally speaking, most procedures are safe.  However, with any procedure there are risks, side effects, and the possibility of complications.  The risks and complications are dependent upon the sites that are lesioned, or the type of nerve block to be performed.  The closer the procedure is to the spine, the more serious the risks are.  Great care is taken when placing the radio frequency needles, block needles or lesioning probes, but sometimes complications can occur. Infection: Any time there is an injection through the skin, there is a risk of infection.  This is why sterile conditions are used for these blocks.  There are four possible types of infection. Localized skin infection. Central Nervous System Infection-This can be in the form of Meningitis, which can be deadly. Epidural Infections-This can be in the form of an epidural abscess, which can cause pressure inside of the spine, causing compression of the spinal cord with subsequent paralysis. This would require an emergency surgery to decompress, and there are no guarantees that the patient would recover from the paralysis. Discitis-This is an infection of the intervertebral discs.  It occurs in about 1% of discography procedures.  It is difficult to treat and it may lead to surgery.        2. Pain: the needles have to go through skin and soft tissues, will cause soreness.       3. Damage to internal structures:  The nerves to be lesioned may be near blood vessels or    other nerves which can be potentially damaged.       4. Bleeding: Bleeding is more common if the patient is taking blood thinners such as  aspirin, Coumadin, Ticiid, Plavix, etc., or if he/she have some genetic predisposition  such as hemophilia. Bleeding into the spinal canal can cause compression of the spinal  cord with subsequent paralysis.  This would require an emergency  surgery to  decompress and there are no guarantees that the patient would recover from the  paralysis.       5. Pneumothorax:  Puncturing of a lung is a possibility, every time a needle is introduced in  the area of the chest or upper back.  Pneumothorax refers to free air around the  collapsed lung(s), inside of the thoracic cavity (chest cavity).  Another two possible  complications related to a similar event would include: Hemothorax and Chylothorax.   These are variations of the Pneumothorax, where instead of air around the collapsed  lung(s), you may have blood or chyle, respectively.       6. Spinal headaches: They may occur with any procedures in the area of the spine.       7. Persistent CSF (Cerebro-Spinal Fluid) leakage: This is a rare problem, but may occur  with prolonged intrathecal or epidural catheters either due to the formation of a fistulous  track or a dural tear.       8. Nerve damage: By working so close to the spinal cord, there is always a possibility of  nerve damage, which could be as serious as a permanent spinal cord injury with  paralysis.       9. Death:  Although rare, severe deadly allergic reactions known as "Anaphylactic  reaction" can occur to any of the medications used.      10. Worsening of the symptoms:  We can always make thing worse.  What are the chances  of something like this happening? Chances of any of this occuring are extremely low.  By statistics, you have more of a chance of getting killed in a motor vehicle accident: while driving to the hospital than any of the above occurring .  Nevertheless, you should be aware that they are possibilities.  In general, it is similar to taking a shower.  Everybody knows that you can slip, hit your head and get killed.  Does that mean that you should not shower again?  Nevertheless always keep in mind that statistics do not mean anything if you happen to be on the wrong side of them.  Even if a procedure has a 1 (one) in a  1,000,000 (million) chance of going wrong, it you happen to be that one..Also, keep in mind that by statistics, you have more of a chance of having something go wrong when taking medications.  Who should not have this procedure? If you are on a blood thinning medication (e.g. Coumadin, Plavix, see list of "Blood Thinners"), or if you have an active infection going on, you should not have the procedure.  If you are taking any blood thinners, please inform your physician.  How should I prepare for this procedure? Do not eat or drink anything at least six hours prior to the procedure. Bring a driver with you .  It cannot be a taxi. Come accompanied by an adult that can drive you back, and that is strong enough to help you if your legs get weak or numb from the local anesthetic. Take all of your medicines the morning of the procedure with just enough water to swallow them. If you have diabetes, make sure that you are scheduled to have your procedure done first thing in the morning, whenever possible. If you have diabetes, take only half of your insulin dose and notify our nurse that you have done so as soon as you arrive at the clinic. If you are diabetic, but only take blood sugar pills (oral hypoglycemic), then do not take them on the morning of your procedure.  You may take them after you have had the procedure. Do not take aspirin or any aspirin-containing medications, at least eleven (11) days prior to the procedure.  They may prolong bleeding. Wear loose fitting clothing that may be easy to take off and that you would not mind if it got stained with Betadine or blood. Do not wear any jewelry or perfume Remove any nail coloring.  It will interfere with some of our monitoring equipment.  NOTE: Remember that this is not meant to be interpreted as a complete list of all possible complications.  Unforeseen problems may occur.  BLOOD THINNERS The following drugs contain aspirin or other products,  which can cause increased bleeding during surgery and should not be taken for 2 weeks prior to and 1 week after surgery.  If you should need take something for relief of minor pain, you may take acetaminophen which is found in Tylenol,m Datril, Anacin-3 and Panadol. It is not blood thinner. The products listed below are.  Do not take any of the products listed below in addition to any listed on your instruction sheet.  A.P.C or A.P.C with Codeine Codeine Phosphate Capsules #3 Ibuprofen Ridaura  ABC compound Congesprin Imuran rimadil  Advil Cope Indocin Robaxisal  Alka-Seltzer Effervescent Pain Reliever and Antacid Coricidin or Coricidin-D  Indomethacin Rufen  Alka-Seltzer plus Cold Medicine Cosprin Ketoprofen S-A-C Tablets  Anacin Analgesic Tablets or Capsules Coumadin  Korlgesic Salflex  Anacin Extra Strength Analgesic tablets or capsules CP-2 Tablets Lanoril Salicylate  Anaprox Cuprimine Capsules Levenox Salocol  Anexsia-D Dalteparin Magan Salsalate  Anodynos Darvon compound Magnesium Salicylate Sine-off  Ansaid Dasin Capsules Magsal Sodium Salicylate  Anturane Depen Capsules Marnal Soma  APF Arthritis pain formula Dewitt's Pills Measurin Stanback  Argesic Dia-Gesic Meclofenamic Sulfinpyrazone  Arthritis Bayer Timed Release Aspirin Diclofenac Meclomen Sulindac  Arthritis pain formula Anacin Dicumarol Medipren Supac  Analgesic (Safety coated) Arthralgen Diffunasal Mefanamic Suprofen  Arthritis Strength Bufferin Dihydrocodeine Mepro Compound Suprol  Arthropan liquid Dopirydamole Methcarbomol with Aspirin Synalgos  ASA tablets/Enseals Disalcid Micrainin Tagament  Ascriptin Doan's Midol Talwin  Ascriptin A/D Dolene Mobidin Tanderil  Ascriptin Extra Strength Dolobid Moblgesic Ticlid  Ascriptin with Codeine Doloprin or Doloprin with Codeine Momentum Tolectin  Asperbuf Duoprin Mono-gesic Trendar  Aspergum Duradyne Motrin or Motrin IB Triminicin  Aspirin plain, buffered or enteric coated  Durasal Myochrisine Trigesic  Aspirin Suppositories Easprin Nalfon Trillsate  Aspirin with Codeine Ecotrin Regular or Extra Strength Naprosyn Uracel  Atromid-S Efficin Naproxen Ursinus  Auranofin Capsules Elmiron Neocylate Vanquish  Axotal Emagrin Norgesic Verin  Azathioprine Empirin or Empirin with Codeine Normiflo Vitamin E  Azolid Emprazil Nuprin Voltaren  Bayer Aspirin plain, buffered or children's or timed BC Tablets or powders Encaprin Orgaran Warfarin Sodium  Buff-a-Comp Enoxaparin Orudis Zorpin  Buff-a-Comp with Codeine Equegesic Os-Cal-Gesic   Buffaprin Excedrin plain, buffered or Extra Strength Oxalid   Bufferin Arthritis Strength Feldene Oxphenbutazone   Bufferin plain or Extra Strength Feldene Capsules Oxycodone with Aspirin   Bufferin with Codeine Fenoprofen Fenoprofen Pabalate or Pabalate-SF   Buffets II Flogesic Panagesic   Buffinol plain or Extra Strength Florinal or Florinal with Codeine Panwarfarin   Buf-Tabs Flurbiprofen Penicillamine   Butalbital Compound Four-way cold tablets Penicillin   Butazolidin Fragmin Pepto-Bismol   Carbenicillin Geminisyn Percodan   Carna Arthritis Reliever Geopen Persantine   Carprofen Gold's salt Persistin   Chloramphenicol Goody's Phenylbutazone   Chloromycetin Haltrain Piroxlcam   Clmetidine heparin Plaquenil   Cllnoril Hyco-pap Ponstel   Clofibrate Hydroxy chloroquine Propoxyphen         Before stopping any of these medications, be sure to consult the physician who ordered them.  Some, such as Coumadin (Warfarin) are ordered to prevent or treat serious conditions such as "deep thrombosis", "pumonary embolisms", and other heart problems.  The amount of time that you may need off of the medication may also vary with the medication and the reason for which you were taking it.  If you are taking any of these medications, please make sure you notify your pain physician before you undergo any procedures.         Selective Nerve Root  Block Patient Information  Description: Specific nerve roots exit the spinal canal and these nerves can be compressed and inflamed by a bulging disc and bone spurs.  By injecting steroids on the nerve root, we can potentially decrease the inflammation surrounding these nerves, which often leads to decreased pain.  Also, by injecting local anesthesia on the nerve root, this can provide [place] helpful information to give to your referring doctor if it decreases your pain.  Selective nerve root blocks can be done along the spine from the neck to the low back depending on the location of your pain.   After numbing the skin with local anesthesia, a small needle is passed to the nerve root and the position of the needle is verified using x-ray pictures.  After the needle is  in correct position, we then deposit the medication.  You may experience a pressure sensation while this is being done.  The entire block usually lasts less than 15 minutes.  Conditions that may be treated with selective nerve root blocks: Low back and leg pain Spinal stenosis Diagnostic block prior to potential surgery Neck and arm pain Post laminectomy syndrome  Preparation for the injection:  Do not eat any solid food or dairy products within 8 hours of your appointment. You may drink clear liquids up to 3 hours before an appointment.  Clear liquids include water, black coffee, juice or soda.  No milk or cream please. You may take your regular medications, including pain medications, with a sip of water before your appointment.  Diabetics should hold regular insulin (if taken separately) and take 1/2 normal NPH dose the morning of the procedure.  Carry some sugar containing items with you to your appointment. A driver must accompany you and be prepared to drive you home after your procedure. Bring all your current medications with you. An IV may be inserted and sedation may be given at the discretion of the physician. A blood  pressure cuff, EKG, and other monitors will often be applied during the procedure.  Some patients may need to have extra oxygen administered for a short period. You will be asked to provide medical information, including allergies, prior to the procedure.  We must know immediately if you are taking blood  Thinners (like Coumadin) or if you are allergic to IV iodine contrast (dye).  Possible side-effects: All are usually temporary Bleeding from needle site Light headedness Numbness and tingling Decreased blood pressure Weakness in arms/legs Pressure sensation in back/neck Pain at injection site (several days)  Possible complications: All are extremely rare Infection Nerve injury Spinal headache (a headache wore with upright position)  Call if you experience: Fever/chills associated with headache or increased back/neck pain Headache worsened by an upright position New onset weakness or numbness of an extremity below the injection site Hives or difficulty breathing (go to the emergency room) Inflammation or drainage at the injection site(s) Severe back/neck pain greater than usual New symptoms which are concerning to you  Please note:  Although the local anesthetic injected can often make your back or neck feel good for several hours after the injection the pain will likely return.  It takes 3-5 days for steroids to work on the nerve root. You may not notice any pain relief for at least one week.  If effective, we will often do a series of 3 injections spaced 3-6 weeks apart to maximally decrease your pain.    If you have any questions, please call [phone] [facility]

## 2023-12-15 NOTE — Progress Notes (Signed)
 PROVIDER NOTE: Interpretation of information contained herein should be left to medically-trained personnel. Specific patient instructions are provided elsewhere under Patient Instructions section of medical record. This document was created in part using AI and STT-dictation technology, any transcriptional errors that may result from this process are unintentional.  Patient: Kyle Simon  Service: E/M   PCP: Corwin Antu, FNP  DOB: 04/01/1932  DOS: 12/15/2023  Provider: Wallie Sherry, MD  MRN: [id number]  Delivery: Face-to-face  Specialty: Interventional Pain Management  Type: Established Patient  Setting: Ambulatory outpatient facility  Specialty designation: 09  Referring Prov.: Corwin Antu, FNP  Location: Outpatient office facility       History of present illness (HPI) Kyle Simon, a 89 y.o. year old male, is here today because of his Left rotator cuff tear arthropathy [M75.102, M12.812]. Kyle Simon's primary complain today is Shoulder Pain (left)  Pertinent problems: Kyle Simon does not have any pertinent problems on file.  Pain Assessment: Severity of Chronic pain is reported as a 5 /10. Location: Arm Left, Upper/ . Onset: More than a month ago. Quality: Sharp. Timing: Intermittent. Modifying factor(s): stillness, rest. Vitals:  height is 5' 10 (1.778 m) and weight is 181 lb (82.1 kg). His temporal temperature is 97.2 F (36.2 C) (abnormal). His blood pressure is 130/45 (abnormal) and his pulse is 61. His respiration is 16 and oxygen saturation is 98%.  BMI: Estimated body mass index is 25.97 kg/m as calculated from the following:   Height as of this encounter: 5' 10 (1.778 m).   Weight as of this encounter: 181 lb (82.1 kg).  Last encounter: 10/06/2023. Last procedure: 11/14/2023.  Reason for encounter:   History of Present Illness   Kyle Simon is an 89 year old male who presents for follow-up of left shoulder pain after a suprascapular nerve block.  He  experienced significant improvement in his left shoulder pain following the suprascapular nerve block. The day after the procedure, he reported no pain, with more than fifty percent improvement compared to before the nerve block. The pain in his bicep and shoulder was significantly reduced, with shoulder pain rated as a two out of ten.  His social history includes a previously active lifestyle, including playing golf and traveling, but he now spends much of his time attending medical appointments.       Post-Procedure Evaluation   Procedure: Suprascapular nerve block (SSNB) #1  Laterality:  Left  Level: Superior to scapular spine, lateral to supraspinatus fossa (Suprascapular notch).  Imaging: Fluoroscopic guidance         DOS: 11/14/2023  Performed by: Wallie Sherry, MD  Purpose: Diagnostic/Therapeutic Indications: Shoulder pain, severe enough to impact quality of life and/or function. 1. Left rotator cuff tear arthropathy   2. Arthritis of left glenohumeral joint    NAS-11 score:   Pre-procedure: 8 /10   Post-procedure: 8 /10     Effectiveness:  Initial hour after procedure: 100 %  Subsequent 4-6 hours post-procedure: 100 %  Analgesia past initial 6 hours: 50 %  Ongoing improvement:  Analgesic: 75% left shoulder pain relief, 50% for the left bicep Function: Somewhat improved ROM: Somewhat improved  No notes on file  UDS:  Summary  Date Value Ref Range Status  10/06/2023 FINAL  Final    Comment:    ==================================================================== Compliance Drug Analysis, Ur ==================================================================== Test  Result       Flag       Units  Drug Present and Declared for Prescription Verification   Tramadol                        1163         EXPECTED   ng/mg creat   O-Desmethyltramadol            924          EXPECTED   ng/mg creat   N-Desmethyltramadol            157          EXPECTED    ng/mg creat    Source of tramadol  is a prescription medication. O-desmethyltramadol    and N-desmethyltramadol are expected metabolites of tramadol .    Acetaminophen                   PRESENT      EXPECTED  Drug Present not Declared for Prescription Verification   Dextromethorphan               PRESENT      UNEXPECTED   Dextrorphan/Levorphanol        PRESENT      UNEXPECTED    Dextrorphan is an expected metabolite of dextromethorphan, an over-    the-counter or prescription cough suppressant. Dextrorphan cannot be    distinguished from the scheduled prescription medication levorphanol    by the method used for analysis.  Drug Absent but Declared for Prescription Verification   Hydrocodone                     Not Detected UNEXPECTED ng/mg creat   Gabapentin                      Not Detected UNEXPECTED   Nortriptyline                   Not Detected UNEXPECTED   Diclofenac                      Not Detected UNEXPECTED    Diclofenac , as indicated in the declared medication list, is not    always detected even when used as directed.  ==================================================================== Test                      Result    Flag   Units      Ref Range   Creatinine              118              mg/dL      >=79 ==================================================================== Declared Medications:  The flagging and interpretation on this report are based on the  following declared medications.  Unexpected results may arise from  inaccuracies in the declared medications.   **Note: The testing scope of this panel includes these medications:   Gabapentin  (Neurontin )  Hydrocodone  (Norco)  Nortriptyline  (Pamelor )  Tramadol  (Ultram )   **Note: The testing scope of this panel does not include small to  moderate amounts of these reported medications:   Acetaminophen  (Norco)  Diclofenac  (Voltaren )   **Note: The testing scope of this panel does not include the  following  reported medications:   Celecoxib  (Celebrex )  Doxycycline   Eye Drop  Fexofenadine  (Allegra )  Fish Oil  Furosemide  (Lasix )  Levothyroxine  (Synthroid )  Multivitamin  Omeprazole  (Prilosec)  Rosuvastatin  (  Crestor )  Tamsulosin  (Flomax )  Valsartan  (Diovan ) ==================================================================== For clinical consultation, please call [phone]. ====================================================================     No results found for: CBDTHCR No results found for: D8THCCBX No results found for: D9THCCBX  ROS  Constitutional: Denies any fever or chills Gastrointestinal: No reported hemesis, hematochezia, vomiting, or acute GI distress Musculoskeletal: Left shoulder pain Neurological: No reported episodes of acute onset apraxia, aphasia, dysarthria, agnosia, amnesia, paralysis, loss of coordination, or loss of consciousness  Medication Review  Carboxymethylcellulose Sodium, Fish Oil, Multiple Vitamin, celecoxib , diclofenac  Sodium, doxycycline , fexofenadine , furosemide , gabapentin , levothyroxine , nortriptyline , omeprazole , rosuvastatin , tamsulosin , traMADol , and valsartan   History Review  Allergy: Kyle Simon is allergic to simvastatin and oxycodone . Drug: Kyle Simon  reports no history of drug use. Alcohol :  reports that he does not currently use alcohol . Tobacco:  reports that he quit smoking about 57 years ago. His smoking use included cigarettes. He started smoking about 75 years ago. He has a 18 pack-year smoking history. He has never been exposed to tobacco smoke. He has never used smokeless tobacco. Social: Kyle Simon  reports that he quit smoking about 57 years ago. His smoking use included cigarettes. He started smoking about 75 years ago. He has a 18 pack-year smoking history. He has never been exposed to tobacco smoke. He has never used smokeless tobacco. He reports that he does not currently use alcohol . He reports that he does not use  drugs. Medical:  has a past medical history of Arthritis, Cataract (2010), Constipation due to pain medication, Diabetes mellitus without complication (HCC), Enlarged prostate, Frequent urination at night, GERD (gastroesophageal reflux disease), History of noncompliance with medical treatment, presenting hazards to health (11/20/2018), Hyperlipidemia associated with type 2 diabetes mellitus (HCC) (04/19/2007), Hypertension, Hypothyroidism, Polio, Snoring, Statin declined-  (11/20/2018), Torn meniscus, and Transfusion history (2012). Surgical: Kyle Simon  has a past surgical history that includes Uvulectomy (1996); Inguinal hernia repair (2002); Tonsillectomy; Prostate surgery (07/2006); Vasectomy; Lipoma excision; Wrist surgery (8040); sinus & throat surgery-1995 (1995); Meniscectomy (Bilateral); Eye surgery (2003); Back surgery (2011); Total knee arthroplasty (01/11/2011); Joint replacement (2012); Cardiac catheterization (09/2001); Rotator cuff repair (Right, 11/2014); Total knee arthroplasty (Right, 11/22/2016); Colonoscopy; Anterior lat lumbar fusion (Right, 11/03/2017); XI robotic assisted ventral hernia (N/A, 09/07/2020); Colonoscopy with propofol  (N/A, 05/05/2022); Colonoscopy (N/A, 05/06/2022); and Spine surgery (2005). Family: family history includes Asthma in his brother; Bone cancer in his brother; Coronary artery disease in his mother; Diabetes in his paternal grandmother; Diverticulosis in his mother; Emphysema in his mother; Sarcoidosis in his son; Sudden death in his father; Thyroid  disease in his daughter and mother.  Laboratory Chemistry Profile   Renal Lab Results  Component Value Date   BUN 16 07/18/2023   CREATININE 1.00 07/18/2023   LABCREA 136 07/21/2023   BCR SEE NOTE: 07/18/2023   GFR 71.80 12/27/2022   GFRAA 85 03/21/2019   GFRNONAA >60 11/05/2022    Hepatic Lab Results  Component Value Date   AST 17 11/05/2022   ALT 14 11/05/2022   ALBUMIN 3.5 11/05/2022   ALKPHOS 64  11/05/2022   LIPASE 27 09/06/2020    Electrolytes Lab Results  Component Value Date   NA 130 (L) 07/18/2023   K 5.0 07/18/2023   CL 98 07/18/2023   CALCIUM  8.7 07/18/2023   MG 1.9 08/17/2022   PHOS 2.9 02/04/2016    Bone Lab Results  Component Value Date   VD25OH 48.0 11/15/2018    Inflammation (CRP: Acute Phase) (ESR: Chronic Phase) Lab Results  Component Value Date  CRP 1.2 04/22/2017   ESRSEDRATE 22 (H) 10/20/2021         Note: Above Lab results reviewed.  Recent Imaging Review  EXAM DESCRIPTION: MR SHOULDER LEFT WO CONTRAST   CLINICAL HISTORY: 89 years, Male, Shoulder pain, chronic, bursitis suspected, xray done; Shoulder pain, chronic, erosive osteoarthritis suspected, xray done; severe left shoulder pain, degenerative change on xray, suspect rotator cuff tear/arthropathy   COMPARISON: None   TECHNIQUE: MRI of the shoulder was performed with multiplanar multi sequence imaging according to our usual protocol.   FINDINGS: There is a large full-thickness tear of the supraspinatus and infraspinatus tendons with fibers near the glenoid rim. Mild muscle belly atrophy. Mild to moderate bursal fluid. The subscapularis and teres minor tendons are unremarkable.   No significant joint effusion. Mild degenerative tear to the posterior labrum. There is a complete tear of the proximal biceps tendon retracted distally. Mild acromioclavicular osteoarthritis.   IMPRESSION: Large retracted full-thickness tears of the supraspinatus and infraspinatus tendons. Mild muscle belly atrophy.   Complete tear of the proximal biceps tendon retracted distally.   Mild degenerative tear to the posterior labrum.   Mild acromioclavicular osteoarthritis.   Electronically signed by: Reyes Frees MD 10/14/2023 10:39 AM EDT RP Workstation: [id number] Note: Reviewed        Physical Exam  Vitals: BP (!) 130/45 (Cuff Size: Normal)   Pulse 61   Temp (!) 97.2 F (36.2 C) (Temporal)    Resp 16   Ht 5' 10 (1.778 m)   Wt 181 lb (82.1 kg)   SpO2 98%   BMI 25.97 kg/m  BMI: Estimated body mass index is 25.97 kg/m as calculated from the following:   Height as of this encounter: 5' 10 (1.778 m).   Weight as of this encounter: 181 lb (82.1 kg). Ideal: Ideal body weight: 73 kg (160 lb 15 oz) Adjusted ideal body weight: 76.6 kg (168 lb 15.4 oz) General appearance: Well nourished, well developed, and well hydrated. In no apparent acute distress Mental status: Alert, oriented x 3 (person, place, & time)       Respiratory: No evidence of acute respiratory distress Eyes: PERLA Left shoulder pain, limited range of motion, limited shoulder abduction  Assessment   Diagnosis  1. Left rotator cuff tear arthropathy   2. Arthritis of left glenohumeral joint   3. Chronic painful diabetic neuropathy (HCC)   4. Chronic pain syndrome      Updated Problems: No problems updated.  Plan of Care  Patient with rotator cuff arthropathy from full-thickness supraspinatus and infraspinatus tears, marked left shoulder pain and severely limited ROM. Following a left suprascapular nerve block, he experienced 100% pain relief for 5 days, then sustained ~75% relief of shoulder pain and ~50% relief of biceps pain, indicating the suprascapular nerve as a meaningful pain generator. We reviewed options, including repeat suprascapular nerve block with increased injectate volume versus pulsed radiofrequency ablation (pRFA) of the suprascapular nerve for longer relief. After discussing expected benefits, risks (bleeding, infection, transient numbness/weakness), and alternatives, the patient elects to proceed with a repeat suprascapular nerve block using additional volume. If the response is suboptimal or short-lived, we will consider pulsed RFA of the suprascapular nerve and, if needed, cervical suprascapular nerve RFA. Continue shoulder-protective activity modification, ice/heat as tolerated, and targeted PT  for deltoid/scapular stabilizers; maintain current medications as needed. Reassess pain scores, function (overhead reach, dressing, sleep), and duration of relief after the repeat block to guide escalation.  Orders:  Orders Placed This Encounter  Procedures   SUPRASCAPULAR NERVE BLOCK    For shoulder pain.    Standing Status:   Future    Expiration Date:   03/16/2024    Scheduling Instructions:     Purpose: Left     Level(s): Suprascapular notch     Sedation: Patient's choice.     Scheduling Timeframe: As permitted by the schedule    Where will this procedure be performed?:   [facility]     [place]  10/19/2023, left suprascapular nerve block: 11/14/23    Return in about 4 days (around 12/19/2023) for Left SSNB #2, in clinic [facility].    Recent Visits Date Type Provider Dept  11/14/23 Procedure visit Marcelino Nurse, MD [facility]  10/19/23 Procedure visit Marcelino Nurse, MD [facility]  10/06/23 Office Visit Marcelino Nurse, MD [facility]  Showing recent visits within past 90 days and meeting all other requirements Today's Visits Date Type Provider Dept  12/15/23 Office Visit Marcelino Nurse, MD [facility]  Showing today's visits and meeting all other requirements Future Appointments No visits were found meeting these conditions. Showing future appointments within next 90 days and meeting all other requirements  I discussed the assessment and treatment plan with the patient. The patient was provided an opportunity to ask questions and all were answered. The patient agreed with the plan and demonstrated an understanding of the instructions.  Patient advised to call back or seek an in-person evaluation if the symptoms or condition worsens.  I personally spent a total of 20 minutes in the care of the patient today including preparing to see the patient, getting/reviewing separately obtained history, performing a medically appropriate  exam/evaluation, counseling and educating, placing orders, and documenting clinical information in the EHR.   Note by: Nurse Marcelino, MD (TTS and AI technology used. I apologize for any typographical errors that were not detected and corrected.) Date: 12/15/2023; Time: 10:54 AM

## 2023-12-19 ENCOUNTER — Ambulatory Visit
Admission: RE | Admit: 2023-12-19 | Discharge: 2023-12-19 | Disposition: A | Discharge status: Home / Self Care | Source: Ambulatory Visit | Attending: Student in an Organized Health Care Education/Training Program | Admitting: Student in an Organized Health Care Education/Training Program

## 2023-12-19 ENCOUNTER — Ambulatory Visit (HOSPITAL_BASED_OUTPATIENT_CLINIC_OR_DEPARTMENT_OTHER): Admitting: Student in an Organized Health Care Education/Training Program

## 2023-12-19 ENCOUNTER — Encounter: Payer: Self-pay | Admitting: Student in an Organized Health Care Education/Training Program

## 2023-12-19 ENCOUNTER — Other Ambulatory Visit

## 2023-12-19 VITALS — BP 135/89 | HR 70 | Temp 98.1°F | Resp 16 | Ht 70.0 in | Wt 181.0 lb

## 2023-12-19 DIAGNOSIS — E871 Hypo-osmolality and hyponatremia: Secondary | ICD-10-CM | POA: Diagnosis not present

## 2023-12-19 DIAGNOSIS — M19012 Primary osteoarthritis, left shoulder: Secondary | ICD-10-CM | POA: Diagnosis not present

## 2023-12-19 DIAGNOSIS — E063 Autoimmune thyroiditis: Secondary | ICD-10-CM | POA: Diagnosis not present

## 2023-12-19 DIAGNOSIS — M12812 Other specific arthropathies, not elsewhere classified, left shoulder: Secondary | ICD-10-CM

## 2023-12-19 DIAGNOSIS — G894 Chronic pain syndrome: Secondary | ICD-10-CM

## 2023-12-19 DIAGNOSIS — R2681 Unsteadiness on feet: Secondary | ICD-10-CM | POA: Diagnosis not present

## 2023-12-19 DIAGNOSIS — M75102 Unspecified rotator cuff tear or rupture of left shoulder, not specified as traumatic: Secondary | ICD-10-CM | POA: Insufficient documentation

## 2023-12-19 DIAGNOSIS — E119 Type 2 diabetes mellitus without complications: Secondary | ICD-10-CM | POA: Diagnosis not present

## 2023-12-19 DIAGNOSIS — M6281 Muscle weakness (generalized): Secondary | ICD-10-CM | POA: Diagnosis not present

## 2023-12-19 LAB — HEMOGLOBIN A1C
Hgb A1c MFr Bld: 6.6 % — ABNORMAL HIGH (ref <5.7)
Mean Plasma Glucose: 143 mg/dL
eAG (mmol/L): 7.9 mmol/L

## 2023-12-19 LAB — BASIC METABOLIC PANEL WITHOUT GFR
BUN: 19 mg/dL (ref 7–25)
CO2: 24 mmol/L (ref 20–32)
Calcium: 9.2 mg/dL (ref 8.6–10.3)
Chloride: 95 mmol/L — ABNORMAL LOW (ref 98–110)
Creat: 0.97 mg/dL (ref 0.70–1.22)
Glucose, Bld: 116 mg/dL — ABNORMAL HIGH (ref 65–99)
Potassium: 4.8 mmol/L (ref 3.5–5.3)
Sodium: 129 mmol/L — ABNORMAL LOW (ref 135–146)

## 2023-12-19 LAB — T4, FREE: Free T4: 1.7 ng/dL (ref 0.8–1.8)

## 2023-12-19 LAB — TSH: TSH: 2.34 m[IU]/L (ref 0.40–4.50)

## 2023-12-19 MED ORDER — IOHEXOL 180 MG/ML  SOLN
10.0000 mL | Freq: Once | INTRAMUSCULAR | Status: AC
  Administered 2023-12-19: 10 mL via INTRA_ARTICULAR

## 2023-12-19 MED ORDER — DEXAMETHASONE SOD PHOSPHATE PF 10 MG/ML IJ SOLN
20.0000 mg | Freq: Once | INTRAMUSCULAR | Status: AC
  Administered 2023-12-19: 10 mg

## 2023-12-19 MED ORDER — LIDOCAINE HCL 2 % IJ SOLN
20.0000 mL | Freq: Once | INTRAMUSCULAR | Status: AC
  Administered 2023-12-19: 400 mg

## 2023-12-19 MED ORDER — ROPIVACAINE HCL 2 MG/ML IJ SOLN
INTRAMUSCULAR | Status: AC
  Filled 2023-12-19: qty 20

## 2023-12-19 MED ORDER — LIDOCAINE HCL 2 % IJ SOLN
INTRAMUSCULAR | Status: AC
  Filled 2023-12-19: qty 20

## 2023-12-19 MED ORDER — ROPIVACAINE HCL 2 MG/ML IJ SOLN
4.0000 mL | Freq: Once | INTRAMUSCULAR | Status: AC
  Administered 2023-12-19: 20 mL via INTRA_ARTICULAR

## 2023-12-19 MED ORDER — IOHEXOL 180 MG/ML  SOLN
INTRAMUSCULAR | Status: AC
Start: 2023-12-19 — End: 2023-12-19
  Filled 2023-12-19: qty 20

## 2023-12-19 NOTE — Patient Instructions (Signed)

## 2023-12-19 NOTE — Progress Notes (Signed)
 PROVIDER NOTE: Interpretation of information contained herein should be left to medically-trained personnel. Specific patient instructions are provided elsewhere under Patient Instructions section of medical record. This document was created in part using STT-dictation technology, any transcriptional errors that may result from this process are unintentional.  Patient: Kyle Simon Type: Established DOB: 06-10-1932 MRN: [id number] PCP: Corwin Antu, FNP  Service: Procedure DOS: 12/19/2023 Setting: Ambulatory Location: Ambulatory outpatient facility Delivery: Face-to-face Provider: Wallie Sherry, MD Specialty: Interventional Pain Management Specialty designation: 09 Location: Outpatient facility Ref. Prov.: Corwin Antu, FNP       Interventional Therapy   Procedure: Suprascapular nerve block (SSNB) #2  Laterality:  Left  Level: Superior to scapular spine, lateral to supraspinatus fossa (Suprascapular notch).  Imaging: Fluoroscopic guidance         DOS: 12/19/2023  Performed by: Wallie Sherry, MD  Purpose: Diagnostic/Therapeutic Indications: Shoulder pain, severe enough to impact quality of life and/or function. 1. Left rotator cuff tear arthropathy   2. Arthritis of left glenohumeral joint   3. Chronic pain syndrome    NAS-11 score:   Pre-procedure: 6 /10   Post-procedure: 0-No pain/10     Target: Suprascapular nerve Location: midway between the medial border of the scapula and the acromion as it runs through the suprascapular notch. Region: Suprascapular, posterior shoulder  Approach: Percutaneous  Neuroanatomy: The suprascapular nerve is the lateral branch of the superior trunk of the brachial plexus. It receives nerve fibers that originate in the nerve roots C5 and C6 (and sometimes C4). It is a mixed nerve, meaning that it provides both sensory and motor supply for the suprascapular region. Function: The main function of this nerve is to provide motor innervation for  two muscles, the supraspinatus and infraspinatus muscles. They are part of the rotator cuff muscles. In addition, the suprascapular nerve provides a sensory supply to the joints of the scapula (glenohumeral and acromioclavicular joints). Rationale (medical necessity): procedure needed and proper for the diagnosis and/or treatment of the patient's medical symptoms and needs.  Position / Prep / Materials:  Position: Prone Materials:  Tray: Block Needle(s):  Type: Spinal  Gauge (G): 22  Length: 3.5 in.  Qty: 1 Prep solution: ChloraPrep (2% chlorhexidine  gluconate and 70% isopropyl alcohol ) Prep Area: Entire posterior shoulder area. From upper spine to shoulder proper (upper arm), and from lateral neck to lower tip of shoulder blade.   H&P (Pre-op Assessment):  Kyle Simon is a 89 y.o. (year old), male patient, seen today for interventional treatment. He  has a past surgical history that includes Uvulectomy (1996); Inguinal hernia repair (2002); Tonsillectomy; Prostate surgery (07/2006); Vasectomy; Lipoma excision; Wrist surgery (8040); sinus & throat surgery-1995 (1995); Meniscectomy (Bilateral); Eye surgery (2003); Back surgery (2011); Total knee arthroplasty (01/11/2011); Joint replacement (2012); Cardiac catheterization (09/2001); Rotator cuff repair (Right, 11/2014); Total knee arthroplasty (Right, 11/22/2016); Colonoscopy; Anterior lat lumbar fusion (Right, 11/03/2017); XI robotic assisted ventral hernia (N/A, 09/07/2020); Colonoscopy with propofol  (N/A, 05/05/2022); Colonoscopy (N/A, 05/06/2022); and Spine surgery (2005). Kyle Simon has a current medication list which includes the following prescription(s): celecoxib , diclofenac  sodium, fexofenadine , furosemide , gabapentin , carboxymethylcellulose sodium, multiple vitamin, nortriptyline , fish oil, omeprazole , rosuvastatin , synthroid , tamsulosin , tramadol , valsartan , and doxycycline . His primarily concern today is the Shoulder Pain (Left ) and Arm Pain  (Bicep of left arm )  Initial Vital Signs:  Pulse/HCG Rate: 70ECG Heart Rate: 70 Temp: 98.1 F (36.7 C) Resp: 18 BP: 119/73 SpO2: 95 %  BMI: Estimated body mass index is 25.97 kg/m as calculated from  the following:   Height as of this encounter: 5' 10 (1.778 m).   Weight as of this encounter: 181 lb (82.1 kg).  Risk Assessment: Allergies: Reviewed. He is allergic to simvastatin and oxycodone .  Allergy Precautions: None required Coagulopathies: Reviewed. None identified.  Blood-thinner therapy: None at this time Active Infection(s): Reviewed. None identified. Kyle Simon is afebrile  Site Confirmation: Kyle Simon was asked to confirm the procedure and laterality before marking the site Procedure checklist: Completed Consent: Before the procedure and under the influence of no sedative(s), amnesic(s), or anxiolytics, the patient was informed of the treatment options, risks and possible complications. To fulfill our ethical and legal obligations, as recommended by the American Medical Association's Code of Ethics, I have informed the patient of my clinical impression; the nature and purpose of the treatment or procedure; the risks, benefits, and possible complications of the intervention; the alternatives, including doing nothing; the risk(s) and benefit(s) of the alternative treatment(s) or procedure(s); and the risk(s) and benefit(s) of doing nothing. The patient was provided information about the general risks and possible complications associated with the procedure. These may include, but are not limited to: failure to achieve desired goals, infection, bleeding, organ or nerve damage, allergic reactions, paralysis, and death. In addition, the patient was informed of those risks and complications associated to the procedure, such as failure to decrease pain; infection; bleeding; organ or nerve damage with subsequent damage to sensory, motor, and/or autonomic systems, resulting in permanent  pain, numbness, and/or weakness of one or several areas of the body; allergic reactions; (i.e.: anaphylactic reaction); and/or death. Furthermore, the patient was informed of those risks and complications associated with the medications. These include, but are not limited to: allergic reactions (i.e.: anaphylactic or anaphylactoid reaction(s)); adrenal axis suppression; blood sugar elevation that in diabetics may result in ketoacidosis or comma; water  retention that in patients with history of congestive heart failure may result in shortness of breath, pulmonary edema, and decompensation with resultant heart failure; weight gain; swelling or edema; medication-induced neural toxicity; particulate matter embolism and blood vessel occlusion with resultant organ, and/or nervous system infarction; and/or aseptic necrosis of one or more joints. Finally, the patient was informed that Medicine is not an exact science; therefore, there is also the possibility of unforeseen or unpredictable risks and/or possible complications that may result in a catastrophic outcome. The patient indicated having understood very clearly. We have given the patient no guarantees and we have made no promises. Enough time was given to the patient to ask questions, all of which were answered to the patient's satisfaction. Mr. Inks has indicated that he wanted to continue with the procedure. Attestation: I, the ordering provider, attest that I have discussed with the patient the benefits, risks, side-effects, alternatives, likelihood of achieving goals, and potential problems during recovery for the procedure that I have provided informed consent. Date  Time: 12/19/2023  2:16 PM  Pre-Procedure Preparation:  Monitoring: As per clinic protocol. Respiration, ETCO2, SpO2, BP, heart rate and rhythm monitor placed and checked for adequate function Safety Precautions: Patient was assessed for positional comfort and pressure points before  starting the procedure. Time-out: I initiated and conducted the Time-out before starting the procedure, as per protocol. The patient was asked to participate by confirming the accuracy of the Time Out information. Verification of the correct person, site, and procedure were performed and confirmed by me, the nursing staff, and the patient. Time-out conducted as per Joint Commission's Universal Protocol (UP.01.01.01). Time: 1519 Start Time:  1519 hrs.  Description of Procedure:          Procedural Technique Safety Precautions: Aspiration looking for blood return was conducted prior to all injections. At no point did we inject any substances, as a needle was being advanced. No attempts were made at seeking any paresthesias. Safe injection practices and needle disposal techniques used. Medications properly checked for expiration dates. SDV (single dose vial) medications used. Description of the Procedure: Protocol guidelines were followed. The patient was placed in position over the procedure table. The target area was identified and the area prepped in the usual manner. Skin & deeper tissues infiltrated with local anesthetic. Appropriate amount of time allowed to pass for local anesthetics to take effect. The procedure needles were then advanced to the target area. Proper needle placement secured. Negative aspiration confirmed. Solution injected in intermittent fashion, asking for systemic symptoms every 0.5cc of injectate. The needles were then removed and the area cleansed, making sure to leave some of the prepping solution back to take advantage of its long term bactericidal properties.  Double steroid dose used: 20 mg of Decadron   Vitals:   12/19/23 1446 12/19/23 1520 12/19/23 1523  BP: 119/73 (!) 136/92 135/89  Pulse: 70    Resp: 18 17 16   Temp: 98.1 F (36.7 C)    TempSrc: Temporal    SpO2: 95% 100% 98%  Weight: 181 lb (82.1 kg)    Height: 5' 10 (1.778 m)       Start Time: 1519  hrs. End Time: 1523 hrs.  Imaging Guidance (Spinal):         Type of Imaging Technique: Fluoroscopy Guidance (Spinal) Indication(s): Fluoroscopy guidance for needle placement to enhance accuracy in procedures requiring precise needle localization for targeted delivery of medication in or near specific anatomical locations not easily accessible without such real-time imaging assistance. Exposure Time: Please see nurses notes. Contrast: Before injecting any contrast, we confirmed that the patient did not have an allergy to iodine, shellfish, or radiological contrast. Once satisfactory needle placement was completed at the desired level, radiological contrast was injected. Contrast injected under live fluoroscopy. No contrast complications. See chart for type and volume of contrast used. Fluoroscopic Guidance: I was personally present during the use of fluoroscopy. Tunnel Vision Technique used to obtain the best possible view of the target area. Parallax error corrected before commencing the procedure. Direction-depth-direction technique used to introduce the needle under continuous pulsed fluoroscopy. Once target was reached, antero-posterior, oblique, and lateral fluoroscopic projection used confirm needle placement in all planes. Images permanently stored in EMR. Interpretation: I personally interpreted the imaging intraoperatively. Adequate needle placement confirmed in multiple planes. Appropriate spread of contrast into desired area was observed. No evidence of afferent or efferent intravascular uptake. No intrathecal or subarachnoid spread observed. Permanent images saved into the patient's record.  Antibiotic Prophylaxis:   Anti-infectives (From admission, onward)    None      Indication(s): None identified  Post-operative Assessment:  Post-procedure Vital Signs:  Pulse/HCG Rate: 7068 Temp: 98.1 F (36.7 C) Resp: 16 BP: 135/89 SpO2: 98 %  EBL: None  Complications: No  immediate post-treatment complications observed by team, or reported by patient.  Note: The patient tolerated the entire procedure well. A repeat set of vitals were taken after the procedure and the patient was kept under observation following institutional policy, for this type of procedure. Post-procedural neurological assessment was performed, showing return to baseline, prior to discharge. The patient was provided with post-procedure discharge instructions, including  a section on how to identify potential problems. Should any problems arise concerning this procedure, the patient was given instructions to immediately contact us , at any time, without hesitation. In any case, we plan to contact the patient by telephone for a follow-up status report regarding this interventional procedure.  Comments:  No additional relevant information.  Plan of Care (POC)  Orders:  Orders Placed This Encounter  Procedures   DG PAIN CLIN[facility] C-ARM 1-60 MIN NO REPORT    Intraoperative interpretation by procedural physician at [facility].    Standing Status:   Standing    Number of Occurrences:   1    Reason for exam::   Assistance in needle guidance and placement for procedures requiring needle placement in or near specific anatomical locations not easily accessible without such assistance.    Medications ordered for procedure: Meds ordered this encounter  Medications   iohexol  (OMNIPAQUE ) 180 MG/ML injection 10 mL    Must be Myelogram-compatible. If not available, you may substitute with a water -soluble, non-ionic, hypoallergenic, myelogram-compatible radiological contrast medium.   lidocaine  (XYLOCAINE ) 2 % (with pres) injection 400 mg   ropivacaine  (PF) 2 mg/mL (0.2%) (NAROPIN ) injection 4 mL   dexamethasone  (DECADRON ) injection 20 mg   Medications administered: We administered iohexol , lidocaine , ropivacaine  (PF) 2 mg/mL (0.2%), and dexamethasone .  See the medical record for exact dosing,  route, and time of administration.    [place]  10/19/2023, left suprascapular nerve block: 11/14/23 (10 mg Decadron ), 12/19/23 (20 mg decadron )     Follow-up plan:   Return in about 2 weeks (around 01/02/2024) for PPE, F2F.     Recent Visits Date Type Provider Dept  12/15/23 Office Visit Marcelino Nurse, MD [facility]  11/14/23 Procedure visit Marcelino Nurse, MD [facility]  10/19/23 Procedure visit Marcelino Nurse, MD [facility]  10/06/23 Office Visit Marcelino Nurse, MD [facility]  Showing recent visits within past 90 days and meeting all other requirements Today's Visits Date Type Provider Dept  12/19/23 Procedure visit Marcelino Nurse, MD [facility]  Showing today's visits and meeting all other requirements Future Appointments Date Type Provider Dept  01/04/24 Appointment Marcelino Nurse, MD [facility]  Showing future appointments within next 90 days and meeting all other requirements   Disposition: Discharge home  Discharge (Date  Time): 12/19/2023;   hrs.   Primary Care Physician: Corwin Antu, FNP Location: [facility] Note by: Nurse Marcelino, MD (TTS technology used. I apologize for any typographical errors that were not detected and corrected.) Date: 12/19/2023; Time: 3:33 PM  Disclaimer:  Medicine is not an Visual merchandiser. The only guarantee in medicine is that nothing is guaranteed. It is important to note that the decision to proceed with this intervention was based on the information collected from the patient. The Data and conclusions were drawn from the patient's questionnaire, the interview, and the physical examination. Because the information was provided in large part by the patient, it cannot be guaranteed that it has not been purposely or unconsciously manipulated. Every effort has been made to obtain as much relevant data as possible for this evaluation. It is important to note  that the conclusions that lead to this procedure are derived in large part from the available data. Always take into account that the treatment will also be dependent on availability of resources and existing treatment guidelines, considered by other Pain Management Practitioners as being common knowledge and practice, at the time of  the intervention. For Medico-Legal purposes, it is also important to point out that variation in procedural techniques and pharmacological choices are the acceptable norm. The indications, contraindications, technique, and results of the above procedure should only be interpreted and judged by a Board-Certified Interventional Pain Specialist with extensive familiarity and expertise in the same exact procedure and technique.

## 2023-12-20 ENCOUNTER — Ambulatory Visit: Payer: Self-pay | Admitting: Endocrinology

## 2023-12-20 ENCOUNTER — Telehealth: Payer: Self-pay | Admitting: *Deleted

## 2023-12-20 LAB — MICROALBUMIN / CREATININE URINE RATIO
Creatinine, Urine: 90 mg/dL (ref 20–320)
Microalb Creat Ratio: 7 mg/g{creat} (ref <30)
Microalb, Ur: 0.6 mg/dL

## 2023-12-20 NOTE — Telephone Encounter (Signed)
 No problems post procedure.

## 2023-12-21 ENCOUNTER — Ambulatory Visit: Admitting: Endocrinology

## 2023-12-21 ENCOUNTER — Encounter: Payer: Self-pay | Admitting: Endocrinology

## 2023-12-21 VITALS — BP 106/80 | Resp 20 | Ht 70.0 in | Wt 190.6 lb

## 2023-12-21 DIAGNOSIS — E063 Autoimmune thyroiditis: Secondary | ICD-10-CM

## 2023-12-21 DIAGNOSIS — E119 Type 2 diabetes mellitus without complications: Secondary | ICD-10-CM | POA: Diagnosis not present

## 2023-12-21 DIAGNOSIS — E871 Hypo-osmolality and hyponatremia: Secondary | ICD-10-CM

## 2023-12-21 NOTE — Progress Notes (Signed)
 Outpatient Endocrinology Note [place] Darwin Guastella, MD   Patient's Name: Kyle Simon    DOB: 17-Feb-1933    MRN: [id number]  REASON OF VISIT:  Follow-up for hypothyroidism / hyponatremia.  PCP: Corwin Antu, FNP  HISTORY OF PRESENT ILLNESS:   Kyle Simon is a 89 y.o. old male with past medical history as listed below is presented for a follow up for hypothyroidism / hyponatremia / type 2 diabetes mellitus.  Pertinent  History: Patient was previously seen by Dr. Von and was last time seen in August 2024.  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 Serum sodium has been low since at least 2011 and has been as low as 125 in the past.  Mostly in the range of low 130s. He has not had any associated symptoms of decreased appetite, nausea or drowsiness Not on any thiazide diuretics or SSRI drugs. Has had negative endocrine work-up for cortisol deficiencies. Initially sodium was significantly better after starting DEMECLOCYCLINE 150 mg daily in 9/21. However this was stopped when his sodium decreased on the treatment down to 126    His urine osmolality was previously high at 247, Urine osmolality has been as high as 551 previously Urine sodium was 130 previously.   He has been advised cut back on his fluid intake especially coffee and also large volumes of water  which he was drinking for his constipation.   With a trial of salt tablets his sodium did not improve, it was still down to 126 in 9/21, also was having nausea from taking more than 1 regular tablet daily.   He was empirically started on FLORINEF  in April 2023 because of persistently low sodium, mild increase in potassium and history of relatively high urine sodium.  He stopped taking Florinef  around beginning of 2025.  Serum sodium has remained stable without Florinef .  CT scan of chest did not show any tumor.  He was taking fludrocortisone  3 times a week  # Primary hypothyroidism -Diagnosed in 2005. At the time of diagnosis patient was not  having symptoms of  fatigue, cold sensitivity, difficulty concentrating, dry skin, weight gain. Likely had only done routine testing and was told that he was hypothyroid and started him on unknown dose of levothyroxine . He did not start feeling any different when he started taking levothyroxine  initially. Also usually does not feel any different with dosage adjustments that have been made. He was referred here because his TSH was tending to be high but also his free T4 was relatively higher in 7/18. On his initial consultation since his TSH was still relatively high and he was taking the equivalent of about 142 g of levothyroxine  daily he was switched to taking 150 g once daily, this has been periodically adjusted   He is now on 137 mcg levothyroxine  daily.   # Type 2 diabetes mellitus on diet control not on antidiabetic medication, he has diabetes for several years.  He has controlled type 2 diabetes mellitus.  He has been monitoring blood sugar at home has One Touch Verio glucometer.  Interval history Recent laboratory results reviewed with stable sodium of 129.  Stable renal function.  Normal urine microalbumin creatinine ratio.  Normal thyroid  function test.  He has been following with podiatry for left great toe degenerative joint disease and swelling.  He has been taking levothyroxine  137 mcg daily, denies palpitation or heat intolerance.  Recent thyroid  function test normal.  Reports last week his blood sugar was 90.  He has been checking blood  sugar occasionally at home.  Did not bring glucometer, no glucose data to review.  Hemoglobin A1c recently 6.6%.  No other complaints today.  REVIEW OF SYSTEMS:  As per history of present illness.   PAST MEDICAL HISTORY: Past Medical History:  Diagnosis Date   Arthritis    both hands;Dr.Deveshwar   Cataract 2010   Constipation due to pain medication    Diabetes mellitus without complication (HCC)    Type II. Uses no medication. Pt controls  with diet and weight   Enlarged prostate    Frequent urination at night    GERD (gastroesophageal reflux disease)    History of noncompliance with medical treatment, presenting hazards to health 11/20/2018   Hyperlipidemia associated with type 2 diabetes mellitus (HCC) 04/19/2007   Qualifier: Diagnosis of  By: Tish MD, William     Hypertension    Hypothyroidism    Polio    as a child w/o complications   Snoring    no sleep apnea   Statin declined-  11/20/2018   patient understands risks associated with this decision and has been advised to restart by cardiologist as well as myself several times   Torn meniscus    Transfusion history 2012   post TKR    PAST SURGICAL HISTORY: Past Surgical History:  Procedure Laterality Date   ANTERIOR LAT LUMBAR FUSION Right 11/03/2017   Procedure: Right Lumbar three-four Anterolateral lumbar interbody fusion with lateral plate;  Surgeon: Unice Pac, MD;  Location: [facility] OR;  Service: Neurosurgery;  Laterality: Right;   BACK SURGERY  2011   hemiarthroplasty 01/11/1999 and 6   CARDIAC CATHETERIZATION  09/2001   negative   COLONOSCOPY     COLONOSCOPY N/A 05/06/2022   Procedure: COLONOSCOPY;  Surgeon: Unk Corinn Skiff, MD;  Location: [facility] ENDOSCOPY;  Service: Gastroenterology;  Laterality: N/A;   COLONOSCOPY WITH PROPOFOL  N/A 05/05/2022   Procedure: COLONOSCOPY WITH PROPOFOL ;  Surgeon: Unk Corinn Skiff, MD;  Location: [facility] ENDOSCOPY;  Service: Gastroenterology;  Laterality: N/A;   EYE SURGERY  2003   R&L cataract removed & IOL- 2003 & 2007,Dr.Jenkins   INGUINAL HERNIA REPAIR  2002   right, Dr. Honore   JOINT REPLACEMENT  2012   left knee replacement   LIPOMA EXCISION     back   MENISCECTOMY Bilateral    Dr. Jane   PROSTATE SURGERY  07/2006   for urinary urgency, Dr. Alline FRIED CUFF REPAIR Right 11/2014   sinus & throat surgery-1995  1995   SPINE SURGERY  2005   TONSILLECTOMY     TOTAL KNEE ARTHROPLASTY  01/11/2011    Procedure: TOTAL KNEE ARTHROPLASTY;  Surgeon: Garnette JONETTA Raman, MD;  Location: [facility] OR;  Service: Orthopedics;  Laterality: Left;   TOTAL KNEE ARTHROPLASTY Right 11/22/2016   Procedure: TOTAL KNEE ARTHROPLASTY;  Surgeon: Raman Kemps, MD;  Location: [facility] OR;  Service: Orthopedics;  Laterality: Right;   UVULECTOMY  1996   Dr. Arlana   VASECTOMY     WRIST SURGERY  1959   fracture- right   XI ROBOTIC ASSISTED VENTRAL HERNIA N/A 09/07/2020   Procedure: XI ROBOTIC ASSISTED DIAGNOSTIC LAPAROSCOPY;  Surgeon: Jordis Laneta FALCON, MD;  Location: [facility] ORS;  Service: General;  Laterality: N/A;    ALLERGIES: Allergies  Allergen Reactions   Simvastatin Other (See Comments)    MYALGIAS   Oxycodone  Other (See Comments)    Severe constipation    FAMILY HISTORY:  Family History  Problem Relation Age of Onset   Coronary  artery disease Mother    Emphysema Mother    Diverticulosis Mother    Thyroid  disease Mother    Sudden death Father        accident   Asthma Brother    Bone cancer Brother    Diabetes Paternal Grandmother    Thyroid  disease Daughter    Sarcoidosis Son    Anesthesia problems Neg Hx    Hypotension Neg Hx    Malignant hyperthermia Neg Hx    Pseudochol deficiency Neg Hx    Colon cancer Neg Hx     SOCIAL HISTORY: Social History   Socioeconomic History   Marital status: Married    Spouse name: June   Number of children: 7   Years of education: 2 years college   Highest education level: Not on file  Occupational History   Occupation: retired  Tobacco Use   Smoking status: Former    Current packs/day: 0.00    Average packs/day: 1 pack/day for 18.0 years (18.0 ttl pk-yrs)    Types: Cigarettes    Start date: 03/01/1948    Quit date: 03/01/1966    Years since quitting: 57.8    Passive exposure: Never   Smokeless tobacco: Never  Vaping Use   Vaping status: Never Used  Substance and Sexual Activity   Alcohol  use: Not Currently    Comment: social, maybe 1 drink weekly   Drug  use: No   Sexual activity: Yes    Birth control/protection: Post-menopausal, Surgical  Other Topics Concern   Not on file  Social History Narrative   09/20/19   From: [place]iginally[place]   Living: with wife June (1985)   Work: retired from Company secretary, 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      Family: 5 children (one passed away) 2 stepchildren, 14 grandchildren, 9 great-grandchildren - Museum/gallery conservator and granddaughter nearby otherwise across the country       Enjoys: fly and play golf, reading      Exercise: walking the dog   Diet: diabetic diet      Safety   Seat belts: Yes    Guns: Yes  and secure   Safe in relationships: Yes    Social Drivers of Corporate investment banker Strain: Low Risk  (09/13/2023)   Overall Financial Resource Strain (CARDIA)    Difficulty of Paying Living Expenses: Not hard at all  Food Insecurity: No Food Insecurity (09/13/2023)   Hunger Vital Sign    Worried About Running Out of Food in the Last Year: Never true    Ran Out of Food in the Last Year: Never true  Transportation Needs: No Transportation Needs (09/13/2023)   PRAPARE - Administrator, Civil Service (Medical): No    Lack of Transportation (Non-Medical): No  Physical Activity: Insufficiently Active (09/13/2023)   Exercise Vital Sign    Days of Exercise per Week: 2 days    Minutes of Exercise per Session: 60 min  Stress: No Stress Concern Present (09/13/2023)   [employer] of Occupational Health - Occupational Stress Questionnaire    Feeling of Stress: Not at all  Social Connections: Socially Integrated (09/13/2023)   Social Connection and Isolation Panel    Frequency of Communication with Friends and Family: More than three times a week    Frequency of Social Gatherings with Friends and Family: Once a week    Attends Religious Services: More than 4 times per year    Active Member of [employer] or Organizations:  Yes    Attends Banker Meetings: Never     Marital Status: Married    MEDICATIONS:  Current Outpatient Medications  Medication Sig Dispense Refill   celecoxib  (CELEBREX ) 100 MG capsule Take 100 mg by mouth 2 (two) times daily.     diclofenac  Sodium (VOLTAREN ) 1 % GEL Apply 2 g topically at bedtime. 100 g 2   doxycycline  (VIBRA -TABS) 100 MG tablet Take 1 tablet (100 mg total) by mouth 2 (two) times daily. 20 tablet 0   fexofenadine  (ALLEGRA ) 180 MG tablet TAKE 1 TABLET BY MOUTH EVERY DAY 90 tablet 1   furosemide  (LASIX ) 20 MG tablet TAKE 1 TABLET (20 MG TOTAL) BY MOUTH DAILY. TAKE 1 TABLET (20 MG TOTAL) BY MOUTH DAILY FOR 7 DAYS. (Patient taking differently: Take by mouth daily as needed. Take 1 tablet (20 mg total) by mouth daily for 7 days.) 90 tablet 2   gabapentin  (NEURONTIN ) 100 MG capsule Take 1 capsule (100 mg total) by mouth 3 (three) times daily. (Patient taking differently: Take 100 mg by mouth 3 (three) times daily as needed.) 90 capsule 3   Hypromellose (ARTIFICIAL TEARS OP) Place 1 drop into both eyes 2 (two) times daily.     Multiple Vitamin (MULTIVITAMIN PO) Take by mouth daily.     nortriptyline  (PAMELOR ) 10 MG capsule Take 10 mg by mouth at bedtime.     Omega-3 Fatty Acids (FISH OIL) 1000 MG CAPS Take 1,000 mg by mouth every evening.     omeprazole  (PRILOSEC) 20 MG capsule TAKE 1 CAPSULE BY MOUTH EVERY DAY 90 capsule 1   rosuvastatin  (CRESTOR ) 5 MG tablet TAKE 1 TABLET AT BEDTIME 90 tablet 1   SYNTHROID  137 MCG tablet TAKE 1 TABLET DAILY BEFORE BREAKFAST 90 tablet 3   tamsulosin  (FLOMAX ) 0.4 MG CAPS capsule TAKE 1 CAPSULE TWICE A DAY 180 capsule 3   traMADol  (ULTRAM ) 50 MG tablet Take 1 tablet (50 mg total) by mouth every 12 (twelve) hours as needed for moderate pain (pain score 4-6). 60 tablet 2   valsartan  (DIOVAN ) 320 MG tablet TAKE 1 TABLET BY MOUTH EVERY DAY 90 tablet 3   No current facility-administered medications for this visit.    PHYSICAL EXAM: Vitals:   12/21/23 1349  BP: 106/80  Resp: 20  Weight:  190 lb 9.6 oz (86.5 kg)  Height: 5' 10 (1.778 m)    Body mass index is 27.35 kg/m.  Wt Readings from Last 3 Encounters:  12/21/23 190 lb 9.6 oz (86.5 kg)  12/19/23 181 lb (82.1 kg)  12/15/23 181 lb (82.1 kg)    General: Well developed, well nourished male in no apparent distress.  HEENT: AT/[place], no external lesions.  Eyes: Conjunctiva clear and no icterus. Lungs: Clear to auscultation, no wheeze. Respirations not labored Heart: S1S2, Regular in rate and rhythm.  Abdomen: Soft, non tender Neurologic: Alert, oriented, normal speech,  no gross focal neurological deficit Extremities: b/l pedal pitting edema, no tremors of outstretched hands Skin: Warm, color good. Psychiatric: Does not appear depressed or anxious  Diabetic foot exam completed December 21, 2023.  He has also been following with podiatry.  PERTINENT HISTORIC LABORATORY AND IMAGING STUDIES:  All pertinent laboratory results were reviewed. Please see HPI also for further details.   TSH  Date Value Ref Range Status  12/19/2023 2.34 0.40 - 4.50 mIU/L Final  07/18/2023 1.52 0.40 - 4.50 mIU/L Final  03/09/2023 1.21 0.40 - 4.50 mIU/L Final     ASSESSMENT / PLAN  1. Acquired autoimmune hypothyroidism   2. Diabetes mellitus type II, non insulin  dependent (HCC)   3. Hyponatremia     # Hyponatremia likely SIADH -Patient has longstanding hyponatremia, mostly serum sodium is stable at low 130s.Usually asymptomatic. -Not taking Florinef  since beginning of 2025.  Serum sodium has remained stable without it.  In the past, he was taking 0.1 mg 1 tablet 3 times a week.   -Discussed about avoiding excessive intake of fluid/water /coffee. -Check BMP with EGFR follow-up visit.  # Primary hypothyroidism -Currently taking Synthroid  brand name 137 mcg daily.  Recent thyroid  function test normal. - Continue current dose.  # Type 2 diabetes mellitus -Controlled, currently is not on antidiabetic medications.  Hemoglobin A1c  6.6%. - Advised to monitor blood sugar at least in the morning fasting and fasting blood sugar should be in low 100.  If elevated asked to call our clinic.  Will monitor without antidiabetic medication.   Diagnoses and all orders for this visit:  Acquired autoimmune hypothyroidism -     T4, free -     TSH  Diabetes mellitus type II, non insulin  dependent (HCC) -     Basic metabolic panel with GFR -     Hemoglobin A1c  Hyponatremia -     Basic metabolic panel with GFR    DISPOSITION Follow up in clinic in 6 months suggested.  Labs today and prior to follow-up visit as ordered.  All questions answered and patient verbalized understanding of the plan.  [place] Shloimy Michalski, MD [facility] Endocrinology [facility] [address] [place], [address] [place], [place] [address] Phone # [phone]  At least part of this note was generated using voice recognition software. Inadvertent word errors may have occurred, which were not recognized during the proofreading process.

## 2023-12-22 DIAGNOSIS — R2681 Unsteadiness on feet: Secondary | ICD-10-CM | POA: Diagnosis not present

## 2023-12-22 DIAGNOSIS — M6281 Muscle weakness (generalized): Secondary | ICD-10-CM | POA: Diagnosis not present

## 2023-12-29 DIAGNOSIS — M6281 Muscle weakness (generalized): Secondary | ICD-10-CM | POA: Diagnosis not present

## 2023-12-29 DIAGNOSIS — R2681 Unsteadiness on feet: Secondary | ICD-10-CM | POA: Diagnosis not present

## 2024-01-02 DIAGNOSIS — M6281 Muscle weakness (generalized): Secondary | ICD-10-CM | POA: Diagnosis not present

## 2024-01-02 DIAGNOSIS — R2681 Unsteadiness on feet: Secondary | ICD-10-CM | POA: Diagnosis not present

## 2024-01-02 DIAGNOSIS — Z23 Encounter for immunization: Secondary | ICD-10-CM | POA: Diagnosis not present

## 2024-01-04 ENCOUNTER — Encounter: Payer: Self-pay | Admitting: Student in an Organized Health Care Education/Training Program

## 2024-01-04 ENCOUNTER — Ambulatory Visit
Attending: Student in an Organized Health Care Education/Training Program | Admitting: Student in an Organized Health Care Education/Training Program

## 2024-01-04 VITALS — BP 135/77 | HR 49 | Temp 97.3°F | Resp 16 | Ht 70.0 in | Wt 180.0 lb

## 2024-01-04 DIAGNOSIS — M75102 Unspecified rotator cuff tear or rupture of left shoulder, not specified as traumatic: Secondary | ICD-10-CM | POA: Insufficient documentation

## 2024-01-04 DIAGNOSIS — M19012 Primary osteoarthritis, left shoulder: Secondary | ICD-10-CM | POA: Diagnosis not present

## 2024-01-04 DIAGNOSIS — G894 Chronic pain syndrome: Secondary | ICD-10-CM | POA: Insufficient documentation

## 2024-01-04 DIAGNOSIS — M12812 Other specific arthropathies, not elsewhere classified, left shoulder: Secondary | ICD-10-CM | POA: Diagnosis not present

## 2024-01-04 NOTE — Progress Notes (Signed)
 Safety precautions to be maintained throughout the outpatient stay will include: orient to surroundings, keep bed in low position, maintain call bell within reach at all times, provide assistance with transfer out of bed and ambulation.

## 2024-01-04 NOTE — Patient Instructions (Signed)
 ______________________________________________________________________    General Risks and Possible Complications  Patient Responsibilities: It is important that you read this as it is part of your informed consent. It is our duty to inform you of the risks and possible complications associated with treatments offered to you. It is your responsibility as a patient to read this and to ask questions about anything that is not clear or that you believe was not covered in this document.  Patient's Rights: You have the right to refuse treatment. You also have the right to change your mind, even after initially having agreed to have the treatment done. However, under this last option, if you wait until the last second to change your mind, you may be charged for the materials used up to that point.  Introduction: Medicine is not an Visual merchandiser. Everything in Medicine, including the lack of treatment(s), carries the potential for danger, harm, or loss (which is by definition: Risk). In Medicine, a complication is a secondary problem, condition, or disease that can aggravate an already existing one. All treatments carry the risk of possible complications. The fact that a side effects or complications occurs, does not imply that the treatment was conducted incorrectly. It must be clearly understood that these can happen even when everything is done following the highest safety standards.  No treatment: You can choose not to proceed with the proposed treatment alternative. The "PRO(s)" would include: avoiding the risk of complications associated with the therapy. The "CON(s)" would include: not getting any of the treatment benefits. These benefits fall under one of three categories: diagnostic; therapeutic; and/or palliative. Diagnostic benefits include: getting information which can ultimately lead to improvement of the disease or symptom(s). Therapeutic benefits are those associated with the successful  treatment of the disease. Finally, palliative benefits are those related to the decrease of the primary symptoms, without necessarily curing the condition (example: decreasing the pain from a flare-up of a chronic condition, such as incurable terminal cancer).  General Risks and Complications: These are associated to most interventional treatments. They can occur alone, or in combination. They fall under one of the following six (6) categories: no benefit or worsening of symptoms; bleeding; infection; nerve damage; allergic reactions; and/or death. No benefits or worsening of symptoms: In Medicine there are no guarantees, only probabilities. No healthcare provider can ever guarantee that a medical treatment will work, they can only state the probability that it may. Furthermore, there is always the possibility that the condition may worsen, either directly, or indirectly, as a consequence of the treatment. Bleeding: This is more common if the patient is taking a blood thinner, either prescription or over the counter (example: Goody Powders, Fish oil, Aspirin, Garlic, etc.), or if suffering a condition associated with impaired coagulation (example: Hemophilia, cirrhosis of the liver, low platelet counts, etc.). However, even if you do not have one on these, it can still happen. If you have any of these conditions, or take one of these drugs, make sure to notify your treating physician. Infection: This is more common in patients with a compromised immune system, either due to disease (example: diabetes, cancer, human immunodeficiency virus [HIV], etc.), or due to medications or treatments (example: therapies used to treat cancer and rheumatological diseases). However, even if you do not have one on these, it can still happen. If you have any of these conditions, or take one of these drugs, make sure to notify your treating physician. Nerve Damage: This is more common when the treatment is  an invasive one, but it  can also happen with the use of medications, such as those used in the treatment of cancer. The damage can occur to small secondary nerves, or to large primary ones, such as those in the spinal cord and brain. This damage may be temporary or permanent and it may lead to impairments that can range from temporary numbness to permanent paralysis and/or brain death. Allergic Reactions: Any time a substance or material comes in contact with our body, there is the possibility of an allergic reaction. These can range from a mild skin rash (contact dermatitis) to a severe systemic reaction (anaphylactic reaction), which can result in death. Death: In general, any medical intervention can result in death, most of the time due to an unforeseen complication. ______________________________________________________________________     Radiofrequency Ablation Radiofrequency ablation is a procedure that is performed to relieve pain. The procedure is often used for back, neck, or arm pain. Radiofrequency ablation involves the use of a machine that creates radio waves to make heat. During the procedure, the heat is applied to the nerve that carries the pain signal. The heat damages the nerve and interferes with the pain signal. Pain relief usually starts about 2 weeks after the procedure and lasts for 6 months to 1 year. Tell a health care provider about: Any allergies you have. All medicines you are taking, including vitamins, herbs, eye drops, creams, and over-the-counter medicines. Any problems you or family members have had with anesthetic medicines. Any bleeding problems you have. Any surgeries you have had. Any medical conditions you have. Whether you are pregnant or may be pregnant. What are the risks? Generally, this is a safe procedure. However, problems may occur, including: Pain or soreness at the injection site. Allergic reaction to medicines given during the procedure. Bleeding. Infection at the  injection site. Damage to nerves or blood vessels. What happens before the procedure? When to stop eating and drinking Follow instructions from your health care provider about what you may eat and drink before your procedure. These may include: 8 hours before the procedure Stop eating most foods. Do not eat meat, fried foods, or fatty foods. Eat only light foods, such as toast or crackers. All liquids are okay except energy drinks and alcohol. 6 hours before the procedure Stop eating. Drink only clear liquids, such as water, clear fruit juice, black coffee, plain tea, and sports drinks. Do not drink energy drinks or alcohol. 2 hours before the procedure Stop drinking all liquids. You may be allowed to take medicine with small sips of water. If you do not follow your health care provider's instructions, your procedure may be delayed or canceled. Medicines Ask your health care provider about: Changing or stopping your regular medicines. This is especially important if you are taking diabetes medicines or blood thinners. Taking medicines such as aspirin and ibuprofen. These medicines can thin your blood. Do not take these medicines unless your health care provider tells you to take them. Taking over-the-counter medicines, vitamins, herbs, and supplements. General instructions Ask your health care provider what steps will be taken to help prevent infection. These steps may include: Removing hair at the procedure site. Washing skin with a germ-killing soap. Taking antibiotic medicine. If you will be going home right after the procedure, plan to have a responsible adult: Take you home from the hospital or clinic. You will not be allowed to drive. Care for you for the time you are told. What happens during the procedure?  You will be awake during the procedure. You will need to be able to talk with the health care provider during the procedure. An IV will be inserted into one of your  veins. You will be given one or more of the following: A medicine to help you relax (sedative). A medicine to numb the area (local anesthetic). Your health care provider will insert a radiofrequency needle into the area to be treated. This is done with the help of fluoroscopy. A wire that carries the radio waves (electrode) will be put through the radiofrequency needle. An electrical pulse will be sent through the electrode to verify the correct nerve that is causing your pain. You will feel a tingling sensation, and you may have muscle twitching. The tissue around the needle tip will be heated by an electric current that comes from the radiofrequency machine. This will numb the nerves. The needle will be removed. A bandage (dressing) will be put on the insertion area. The procedure may vary among health care providers and hospitals. What happens after the procedure? Your blood pressure, heart rate, breathing rate, and blood oxygen level will be monitored until you leave the hospital or clinic. Return to your normal activities as told by your health care provider. Ask your health care provider what activities are safe for you. If you were given a sedative during the procedure, it can affect you for several hours. Do not drive or operate machinery until your health care provider says that it is safe. Summary Radiofrequency ablation is a procedure that is performed to relieve pain. The procedure is often used for back, neck, or arm pain. Radiofrequency ablation involves the use of a machine that creates radio waves to make heat. Plan to have a responsible adult take you home from the hospital or clinic. Do not drive or operate machinery until your health care provider says that it is safe. Return to your normal activities as told by your health care provider. Ask your health care provider what activities are safe for you. This information is not intended to replace advice given to you by your  health care provider. Make sure you discuss any questions you have with your health care provider. Document Revised: 08/05/2020 Document Reviewed: 08/05/2020 [employer] Patient Education  2024 [employer].

## 2024-01-04 NOTE — Progress Notes (Signed)
 PROVIDER NOTE: Interpretation of information contained herein should be left to medically-trained personnel. Specific patient instructions are provided elsewhere under Patient Instructions section of medical record. This document was created in part using AI and STT-dictation technology, any transcriptional errors that may result from this process are unintentional.  Patient: Kyle Simon  Service: E/M   PCP: Corwin Antu, FNP  DOB: [birth year]  DOS: 01/04/2024  Provider: Wallie Sherry, MD  MRN: [id number]  Delivery: Face-to-face  Specialty: Interventional Pain Management  Type: Established Patient  Setting: Ambulatory outpatient facility  Specialty designation: 09  Referring Prov.: Corwin Antu, FNP  Location: Outpatient office facility       History of present illness (HPI) Kyle Simon, a 89 y.o. year old male, is here today because of his Left rotator cuff tear arthropathy [M75.102, M12.812]. Kyle Simon's primary complain today is Shoulder Pain (Left ) and Foot Pain (Left great toe, has seen podiatry )  Pertinent problems: Kyle Simon does not have any pertinent problems on file.  Pain Assessment: Severity of Chronic pain is reported as a 3 /10. Location: Shoulder Left/down left arm to approx wrist. Onset: More than a month ago. Quality: Discomfort, Shooting, Dull. Timing: Intermittent. Modifying factor(s): injections, tylenol  and keeping the arm still. Vitals:  height is 5' 10 (1.778 m) and weight is 180 lb (81.6 kg). His temporal temperature is 97.3 F (36.3 C) (abnormal). His blood pressure is 135/77 and his pulse is 49 (abnormal). His respiration is 16 and oxygen saturation is 85% (abnormal).  BMI: Estimated body mass index is 25.83 kg/m as calculated from the following:   Height as of this encounter: 5' 10 (1.778 m).   Weight as of this encounter: 180 lb (81.6 kg).  Last encounter: 12/15/2023. Last procedure: 12/19/2023.  Reason for encounter:   Post-Procedure Evaluation    Procedure: Suprascapular nerve block (SSNB) #2  Laterality:  Left  Level: Superior to scapular spine, lateral to supraspinatus fossa (Suprascapular notch).  Imaging: Fluoroscopic guidance         DOS: 12/19/2023  Performed by: Wallie Sherry, MD  Purpose: Diagnostic/Therapeutic Indications: Shoulder pain, severe enough to impact quality of life and/or function. 1. Left rotator cuff tear arthropathy   2. Arthritis of left glenohumeral joint   3. Chronic pain syndrome    NAS-11 score:   Pre-procedure: 6 /10   Post-procedure: 0-No pain/10     Effectiveness:  Initial hour after procedure: 100 %  Subsequent 4-6 hours post-procedure: 90 %  Analgesia past initial 6 hours: 90 % (continues to have good pain relief unless he moves the arm in certain ways and this causes pain in the bicep area.)  Ongoing improvement:  Analgesic:  90% Function: Somewhat improved ROM: Somewhat improved   Jakie Chrissie MATSU, RN  01/04/2024  3:03 PM  Sign when Signing Visit Safety precautions to be maintained throughout the outpatient stay will include: orient to surroundings, keep bed in low position, maintain call bell within reach at all times, provide assistance with transfer out of bed and ambulation.   UDS:  Summary  Date Value Ref Range Status  10/06/2023 FINAL  Final    Comment:    ==================================================================== Compliance Drug Analysis, Ur ==================================================================== Test                             Result       Flag       Units  Drug Present and Declared  for Prescription Verification   Tramadol                        1163         EXPECTED   ng/mg creat   O-Desmethyltramadol            924          EXPECTED   ng/mg creat   N-Desmethyltramadol            157          EXPECTED   ng/mg creat    Source of tramadol  is a prescription medication. O-desmethyltramadol    and N-desmethyltramadol are expected metabolites of  tramadol .    Acetaminophen                   PRESENT      EXPECTED  Drug Present not Declared for Prescription Verification   Dextromethorphan               PRESENT      UNEXPECTED   Dextrorphan/Levorphanol        PRESENT      UNEXPECTED    Dextrorphan is an expected metabolite of dextromethorphan, an over-    the-counter or prescription cough suppressant. Dextrorphan cannot be    distinguished from the scheduled prescription medication levorphanol    by the method used for analysis.  Drug Absent but Declared for Prescription Verification   Hydrocodone                     Not Detected UNEXPECTED ng/mg creat   Gabapentin                      Not Detected UNEXPECTED   Nortriptyline                   Not Detected UNEXPECTED   Diclofenac                      Not Detected UNEXPECTED    Diclofenac , as indicated in the declared medication list, is not    always detected even when used as directed.  ==================================================================== Test                      Result    Flag   Units      Ref Range   Creatinine              118              mg/dL      >=79 ==================================================================== Declared Medications:  The flagging and interpretation on this report are based on the  following declared medications.  Unexpected results may arise from  inaccuracies in the declared medications.   **Note: The testing scope of this panel includes these medications:   Gabapentin  (Neurontin )  Hydrocodone  (Norco)  Nortriptyline  (Pamelor )  Tramadol  (Ultram )   **Note: The testing scope of this panel does not include small to  moderate amounts of these reported medications:   Acetaminophen  (Norco)  Diclofenac  (Voltaren )   **Note: The testing scope of this panel does not include the  following reported medications:   Celecoxib  (Celebrex )  Doxycycline   Eye Drop  Fexofenadine  (Allegra )  Fish Oil  Furosemide  (Lasix )  Levothyroxine   (Synthroid )  Multivitamin  Omeprazole  (Prilosec)  Rosuvastatin  (Crestor )  Tamsulosin  (Flomax )  Valsartan  (Diovan ) ==================================================================== For clinical consultation, please call [phone]. ====================================================================  No results found for: CBDTHCR No results found for: D8THCCBX No results found for: D9THCCBX  ROS  Constitutional: Denies any fever or chills Gastrointestinal: No reported hemesis, hematochezia, vomiting, or acute GI distress Musculoskeletal: left shoulder pain Neurological: No reported episodes of acute onset apraxia, aphasia, dysarthria, agnosia, amnesia, paralysis, loss of coordination, or loss of consciousness  Medication Review  Carboxymethylcellulose Sodium, Fish Oil, Multiple Vitamin, celecoxib , diclofenac  Sodium, doxycycline , fexofenadine , furosemide , gabapentin , levothyroxine , nortriptyline , omeprazole , rosuvastatin , tamsulosin , traMADol , and valsartan   History Review  Allergy: Kyle Simon is allergic to simvastatin and oxycodone . Drug: Kyle Simon  reports no history of drug use. Alcohol :  reports that he does not currently use alcohol . Tobacco:  reports that he quit smoking about 57 years ago. His smoking use included cigarettes. He started smoking about 75 years ago. He has a 18 pack-year smoking history. He has never been exposed to tobacco smoke. He has never used smokeless tobacco. Social: Kyle Simon  reports that he quit smoking about 57 years ago. His smoking use included cigarettes. He started smoking about 75 years ago. He has a 18 pack-year smoking history. He has never been exposed to tobacco smoke. He has never used smokeless tobacco. He reports that he does not currently use alcohol . He reports that he does not use drugs. Medical:  has a past medical history of Arthritis, Cataract (2010), Constipation due to pain medication, Diabetes mellitus without  complication (HCC), Enlarged prostate, Frequent urination at night, GERD (gastroesophageal reflux disease), History of noncompliance with medical treatment, presenting hazards to health (11/20/2018), Hyperlipidemia associated with type 2 diabetes mellitus (HCC) (04/19/2007), Hypertension, Hypothyroidism, Polio, Snoring, Statin declined-  (11/20/2018), Torn meniscus, and Transfusion history (2012). Surgical: Kyle Simon  has a past surgical history that includes Uvulectomy (1996); Inguinal hernia repair (2002); Tonsillectomy; Prostate surgery (07/2006); Vasectomy; Lipoma excision; Wrist surgery (8040); sinus & throat surgery-1995 (1995); Meniscectomy (Bilateral); Eye surgery (2003); Back surgery (2011); Total knee arthroplasty (01/11/2011); Joint replacement (2012); Cardiac catheterization (09/2001); Rotator cuff repair (Right, 11/2014); Total knee arthroplasty (Right, 11/22/2016); Colonoscopy; Anterior lat lumbar fusion (Right, 11/03/2017); XI robotic assisted ventral hernia (N/A, 09/07/2020); Colonoscopy with propofol  (N/A, 05/05/2022); Colonoscopy (N/A, 05/06/2022); and Spine surgery (2005). Family: family history includes Asthma in his brother; Bone cancer in his brother; Coronary artery disease in his mother; Diabetes in his paternal grandmother; Diverticulosis in his mother; Emphysema in his mother; Sarcoidosis in his son; Sudden death in his father; Thyroid  disease in his daughter and mother.  Laboratory Chemistry Profile   Renal Lab Results  Component Value Date   BUN 19 12/19/2023   CREATININE 0.97 12/19/2023   LABCREA 90 12/19/2023   BCR SEE NOTE: 12/19/2023   GFR 71.80 12/27/2022   GFRAA 85 03/21/2019   GFRNONAA >60 11/05/2022    Hepatic Lab Results  Component Value Date   AST 17 11/05/2022   ALT 14 11/05/2022   ALBUMIN 3.5 11/05/2022   ALKPHOS 64 11/05/2022   LIPASE 27 09/06/2020    Electrolytes Lab Results  Component Value Date   NA 129 (L) 12/19/2023   K 4.8 12/19/2023   CL  95 (L) 12/19/2023   CALCIUM  9.2 12/19/2023   MG 1.9 08/17/2022   PHOS 2.9 02/04/2016    Bone Lab Results  Component Value Date   VD25OH 48.0 11/15/2018    Inflammation (CRP: Acute Phase) (ESR: Chronic Phase) Lab Results  Component Value Date   CRP 1.2 04/22/2017   ESRSEDRATE 22 (H) 10/20/2021  Note: Above Lab results reviewed.  Recent Imaging Review  EXAM DESCRIPTION: MR SHOULDER LEFT WO CONTRAST   CLINICAL HISTORY: 89 years, Male, Shoulder pain, chronic, bursitis suspected, xray done; Shoulder pain, chronic, erosive osteoarthritis suspected, xray done; severe left shoulder pain, degenerative change on xray, suspect rotator cuff tear/arthropathy   COMPARISON: None   TECHNIQUE: MRI of the shoulder was performed with multiplanar multi sequence imaging according to our usual protocol.   FINDINGS: There is a large full-thickness tear of the supraspinatus and infraspinatus tendons with fibers near the glenoid rim. Mild muscle belly atrophy. Mild to moderate bursal fluid. The subscapularis and teres minor tendons are unremarkable.   No significant joint effusion. Mild degenerative tear to the posterior labrum. There is a complete tear of the proximal biceps tendon retracted distally. Mild acromioclavicular osteoarthritis.   IMPRESSION: Large retracted full-thickness tears of the supraspinatus and infraspinatus tendons. Mild muscle belly atrophy.   Complete tear of the proximal biceps tendon retracted distally.   Mild degenerative tear to the posterior labrum.   Mild acromioclavicular osteoarthritis.   Electronically signed by: Reyes Frees MD 10/14/2023 10:39 AM EDT RP Workstation: [id number] Note: Reviewed       Note: Reviewed        Physical Exam  Vitals: BP 135/77 (BP Location: Right Arm, Patient Position: Sitting, Cuff Size: Normal)   Pulse (!) 49   Temp (!) 97.3 F (36.3 C) (Temporal)   Resp 16   Ht 5' 10 (1.778 m)   Wt 180 lb (81.6 kg)   SpO2  (!) 85% Comment: normal for patient  BMI 25.83 kg/m  BMI: Estimated body mass index is 25.83 kg/m as calculated from the following:   Height as of this encounter: 5' 10 (1.778 m).   Weight as of this encounter: 180 lb (81.6 kg). Ideal: Ideal body weight: 73 kg (160 lb 15 oz) Adjusted ideal body weight: 76.5 kg (168 lb 9 oz) General appearance: Well nourished, well developed, and well hydrated. In no apparent acute distress Mental status: Alert, oriented x 3 (person, place, & time)       Respiratory: No evidence of acute respiratory distress Eyes: PERLA Left shoulder pain, limited range of motion, limited shoulder abduction secondary to pain  Assessment   Diagnosis  1. Left rotator cuff tear arthropathy   2. Arthritis of left glenohumeral joint   3. Chronic pain syndrome      Plan of Care  The patient has severe left rotator cuff arthropathy with associated supraspinatus and infraspinatus tears and tendinopathy, status post two left suprascapular nerve blocks that provided meaningful but temporary pain relief, confirming suprascapular nerve-mediated pain. Given recurrence of pain, we discussed two definitive treatment options: suprascapular nerve radiofrequency ablation (RFA) and peripheral nerve stimulation (PNS). RFA involves using heat energy to selectively disrupt the sensory portion of the nerve to provide pain relief that typically lasts six to twelve months, while PNS provides a reversible neuromodulation approach through a temporary or implantable device that modulates pain signals while preserving motor function. After reviewing the benefits, risks, and expected outcomes of both options, the patient elects to proceed with left suprascapular nerve RFA. We will schedule the procedure under fluoroscopic guidance, ensuring sensory and motor testing prior to lesioning. The patient will continue conservative measures, including targeted physical therapy, acetaminophen  or topical analgesics  as tolerated, and activity modification. Follow-up will be arranged 2-4 weeks post-procedure to assess pain relief, functional improvement, and any adverse effects.  For his left toe pain, he  does have an appointment with Dr. Franky Blanch with podiatry.  We briefly discussed spinal cord stimulation as a potential treatment for his left foot pain although I would need to evaluate his interlaminar windows under live fluoroscopy which I can do during his pulsed radiofrequency ablation of the suprascapular nerve  [name] has a current medication list which includes the following long-term medication(s): fexofenadine , furosemide , gabapentin , omeprazole , rosuvastatin , synthroid , tamsulosin , and valsartan .  Pharmacotherapy (Medications Ordered): No orders of the defined types were placed in this encounter.  Orders:  Orders Placed This Encounter  Procedures   Radiofrequency Shoulder Joint    For shoulder pain.    Standing Status:   Future    Expiration Date:   04/05/2024    Scheduling Instructions:     Procedure: Suprascapular Nerve Radiofrequency Ablation     Laterality: LEFT     Level(s): Suprascapular nerve     Sedation: Patient's choice     Scheduling Timeframe: As soon as pre-approved    Where will this procedure be performed?:   [facility]     [place]  10/19/2023, left suprascapular nerve block: 11/14/23 (10 mg Decadron ), 12/19/23 (20 mg decadron )    Return in about 8 weeks (around 02/27/2024) for Left SSN pRFA.    Recent Visits Date Type Provider Dept  12/19/23 Procedure visit Marcelino Nurse, MD [facility]  12/15/23 Office Visit Marcelino Nurse, MD [facility]  11/14/23 Procedure visit Marcelino Nurse, MD [facility]  10/19/23 Procedure visit Marcelino Nurse, MD [facility]  10/06/23 Office Visit Marcelino Nurse, MD [facility]  Showing recent visits within past 90 days and meeting all other requirements Today's  Visits Date Type Provider Dept  01/04/24 Office Visit Marcelino Nurse, MD [facility]  Showing today's visits and meeting all other requirements Future Appointments Date Type Provider Dept  02/27/24 Appointment Marcelino Nurse, MD [facility]  Showing future appointments within next 90 days and meeting all other requirements  I discussed the assessment and treatment plan with the patient. The patient was provided an opportunity to ask questions and all were answered. The patient agreed with the plan and demonstrated an understanding of the instructions.  Patient advised to call back or seek an in-person evaluation if the symptoms or condition worsens.  I personally spent a total of 30 minutes in the care of the patient today including preparing to see the patient, getting/reviewing separately obtained history, performing a medically appropriate exam/evaluation, counseling and educating, placing orders, and documenting clinical information in the EHR.   Note by: Nurse Marcelino, MD (TTS and AI technology used. I apologize for any typographical errors that were not detected and corrected.) Date: 01/04/2024; Time: 3:49 PM

## 2024-01-05 DIAGNOSIS — M6281 Muscle weakness (generalized): Secondary | ICD-10-CM | POA: Diagnosis not present

## 2024-01-05 DIAGNOSIS — R2681 Unsteadiness on feet: Secondary | ICD-10-CM | POA: Diagnosis not present

## 2024-01-09 DIAGNOSIS — R2681 Unsteadiness on feet: Secondary | ICD-10-CM | POA: Diagnosis not present

## 2024-01-09 DIAGNOSIS — M6281 Muscle weakness (generalized): Secondary | ICD-10-CM | POA: Diagnosis not present

## 2024-01-12 ENCOUNTER — Ambulatory Visit: Admitting: Podiatry

## 2024-01-12 DIAGNOSIS — M6281 Muscle weakness (generalized): Secondary | ICD-10-CM | POA: Diagnosis not present

## 2024-01-12 DIAGNOSIS — R2681 Unsteadiness on feet: Secondary | ICD-10-CM | POA: Diagnosis not present

## 2024-01-12 DIAGNOSIS — M19072 Primary osteoarthritis, left ankle and foot: Secondary | ICD-10-CM | POA: Diagnosis not present

## 2024-01-12 MED ORDER — TRAMADOL HCL 50 MG PO TABS: 50.0000 mg | ORAL_TABLET | Freq: Three times a day (TID) | ORAL | 0 refills | Status: AC | PRN

## 2024-01-12 NOTE — Progress Notes (Signed)
 Subjective:  Patient ID: Kyle Simon, male    DOB: 12/12/1932,  MRN: [id number]  Chief Complaint  Patient presents with   Toe Pain    89 y.o. male presents with the above complaint.  Patient presents with complaint of edema to the left second digit.  No open wounds or lesion he just wanted make sure that the ulceration is not coming back he wants to make sure the edema is not a problematic thing he is a type II diabetic.  He would like to discuss treatment options for this pain scale 7 out of 10 dull aching nature   Review of Systems: Negative except as noted in the HPI. Denies N/V/F/Ch.  Past Medical History:  Diagnosis Date   Arthritis    both hands;Dr.Deveshwar   Cataract 2010   Constipation due to pain medication    Diabetes mellitus without complication (HCC)    Type II. Uses no medication. Pt controls with diet and weight   Enlarged prostate    Frequent urination at night    GERD (gastroesophageal reflux disease)    History of noncompliance with medical treatment, presenting hazards to health 11/20/2018   Hyperlipidemia associated with type 2 diabetes mellitus (HCC) 04/19/2007   Qualifier: Diagnosis of  By: Tish MD, Elsie     Hypertension    Hypothyroidism    Polio    as a child w/o complications   Snoring    no sleep apnea   Statin declined-  11/20/2018   patient understands risks associated with this decision and has been advised to restart by cardiologist as well as myself several times   Torn meniscus    Transfusion history 2012   post TKR    Current Outpatient Medications:    celecoxib  (CELEBREX ) 100 MG capsule, Take 100 mg by mouth 2 (two) times daily., Disp: , Rfl:    diclofenac  Sodium (VOLTAREN ) 1 % GEL, Apply 2 g topically at bedtime., Disp: 100 g, Rfl: 2   doxycycline  (VIBRA -TABS) 100 MG tablet, Take 1 tablet (100 mg total) by mouth 2 (two) times daily., Disp: 20 tablet, Rfl: 0   fexofenadine  (ALLEGRA ) 180 MG tablet, TAKE 1 TABLET BY MOUTH EVERY  DAY, Disp: 90 tablet, Rfl: 1   furosemide  (LASIX ) 20 MG tablet, TAKE 1 TABLET (20 MG TOTAL) BY MOUTH DAILY. TAKE 1 TABLET (20 MG TOTAL) BY MOUTH DAILY FOR 7 DAYS. (Patient taking differently: Take by mouth daily as needed. Take 1 tablet (20 mg total) by mouth daily for 7 days.), Disp: 90 tablet, Rfl: 2   gabapentin  (NEURONTIN ) 100 MG capsule, Take 1 capsule (100 mg total) by mouth 3 (three) times daily. (Patient taking differently: Take 100 mg by mouth 3 (three) times daily as needed.), Disp: 90 capsule, Rfl: 3   Hypromellose (ARTIFICIAL TEARS OP), Place 1 drop into both eyes 2 (two) times daily., Disp: , Rfl:    Multiple Vitamin (MULTIVITAMIN PO), Take by mouth daily., Disp: , Rfl:    nortriptyline  (PAMELOR ) 10 MG capsule, Take 10 mg by mouth at bedtime., Disp: , Rfl:    Omega-3 Fatty Acids (FISH OIL) 1000 MG CAPS, Take 1,000 mg by mouth every evening., Disp: , Rfl:    omeprazole  (PRILOSEC) 20 MG capsule, TAKE 1 CAPSULE BY MOUTH EVERY DAY, Disp: 90 capsule, Rfl: 1   rosuvastatin  (CRESTOR ) 5 MG tablet, TAKE 1 TABLET AT BEDTIME, Disp: 90 tablet, Rfl: 1   SYNTHROID  137 MCG tablet, TAKE 1 TABLET DAILY BEFORE BREAKFAST, Disp: 90 tablet, Rfl:  3   tamsulosin  (FLOMAX ) 0.4 MG CAPS capsule, TAKE 1 CAPSULE TWICE A DAY, Disp: 180 capsule, Rfl: 3   valsartan  (DIOVAN ) 320 MG tablet, TAKE 1 TABLET BY MOUTH EVERY DAY, Disp: 90 tablet, Rfl: 3  Social History   Tobacco Use  Smoking Status Former   Current packs/day: 0.00   Average packs/day: 1 pack/day for 18.0 years (18.0 ttl pk-yrs)   Types: Cigarettes   Start date: 03/01/1948   Quit date: 03/01/1966   Years since quitting: 57.9   Passive exposure: Never  Smokeless Tobacco Never    Allergies  Allergen Reactions   Simvastatin Other (See Comments)    MYALGIAS   Oxycodone  Other (See Comments)    Severe constipation   Objective:  There were no vitals filed for this visit. There is no height or weight on file to calculate BMI. Constitutional Well  developed. Well nourished.  Vascular Dorsalis pedis pulses palpable bilaterally. Posterior tibial pulses palpable bilaterally. Capillary refill normal to all digits.  No cyanosis or clubbing noted. Pedal hair growth normal.  Neurologic Normal speech. Oriented to person, place, and time. Epicritic sensation to light touch grossly present bilaterally.  Dermatologic Left hallux edema with some achiness.  Pain with range of motion of the interphalangeal joint mild crepitus noted no open wounds noted at this time preulcerative callus noted.  Orthopedic: Normal joint ROM without pain or crepitus bilaterally. No visible deformities. No bony tenderness.   Radiographs: None Assessment:   No diagnosis found.  Plan:  Patient was evaluated and treated and all questions answered.  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  He will work on that immediately - If there is no improvement patient may need interphalangeal joint fusion in the future. I discussed with them if an ulceration happens to come see me right away they state understanding.-  No follow-ups on file.

## 2024-01-16 DIAGNOSIS — R2681 Unsteadiness on feet: Secondary | ICD-10-CM | POA: Diagnosis not present

## 2024-01-16 DIAGNOSIS — M6281 Muscle weakness (generalized): Secondary | ICD-10-CM | POA: Diagnosis not present

## 2024-01-19 DIAGNOSIS — M6281 Muscle weakness (generalized): Secondary | ICD-10-CM | POA: Diagnosis not present

## 2024-01-19 DIAGNOSIS — R2681 Unsteadiness on feet: Secondary | ICD-10-CM | POA: Diagnosis not present

## 2024-01-24 DIAGNOSIS — R2681 Unsteadiness on feet: Secondary | ICD-10-CM | POA: Diagnosis not present

## 2024-01-24 DIAGNOSIS — M6281 Muscle weakness (generalized): Secondary | ICD-10-CM | POA: Diagnosis not present

## 2024-01-30 DIAGNOSIS — R2681 Unsteadiness on feet: Secondary | ICD-10-CM | POA: Diagnosis not present

## 2024-01-30 DIAGNOSIS — M6281 Muscle weakness (generalized): Secondary | ICD-10-CM | POA: Diagnosis not present

## 2024-02-02 DIAGNOSIS — R2681 Unsteadiness on feet: Secondary | ICD-10-CM | POA: Diagnosis not present

## 2024-02-02 DIAGNOSIS — M6281 Muscle weakness (generalized): Secondary | ICD-10-CM | POA: Diagnosis not present

## 2024-02-03 ENCOUNTER — Ambulatory Visit: Admitting: Family

## 2024-02-03 VITALS — BP 116/62 | Temp 98.0°F | Ht 70.0 in | Wt 192.6 lb

## 2024-02-03 DIAGNOSIS — J22 Unspecified acute lower respiratory infection: Secondary | ICD-10-CM | POA: Diagnosis not present

## 2024-02-03 DIAGNOSIS — B9689 Other specified bacterial agents as the cause of diseases classified elsewhere: Secondary | ICD-10-CM

## 2024-02-03 MED ORDER — AMOXICILLIN-POT CLAVULANATE 875-125 MG PO TABS: 1.0000 | ORAL_TABLET | Freq: Two times a day (BID) | ORAL | 0 refills | Status: DC

## 2024-02-03 NOTE — Progress Notes (Unsigned)
 Established Patient Office Visit  Subjective:      CC:  Chief Complaint  Patient presents with   Acute Visit    Cough    HPI: Kyle Simon is a 89 y.o. male presenting on 02/03/2024 for Acute Visit (Cough) .  Discussed the use of AI scribe software for clinical note transcription with the patient, who gave verbal consent to proceed.  History of Present Illness Kyle Simon is an 89 year old male with diabetes and chronic kidney disease who presents with a cough and concerns about fluid retention.  He has been experiencing a primarily dry cough that has deepened over the past two weeks, occurring mainly in the evening and morning. The cough is accompanied by wheezing and green nasal mucus. There is no facial pain or sore throat, but he experiences chest discomfort when coughing. He also reports occasional difficulty catching his breath and a slight increase in shortness of breath. No fever or ear pain. He has a history of pneumonia and is concerned about avoiding it again.  He has diabetes and is currently on Trulicity. His last A1c was checked in July, and he has an upcoming appointment in January for a follow-up. He is concerned about his GFR, which has decreased to 13. He recently saw his nephrologist, who discontinued spironolactone due to its potential impact on his kidney function. He is scheduled for further testing in two weeks and a follow-up visit after Christmas.  He has a history of fluid retention and was previously prescribed a diuretic by his cardiologist, which he was instructed to take daily for seven days. However, he received conflicting advice from another doctor to take it as needed. He reports persistent swelling in his legs, particularly the left one. His weight has fluctuated, with a recent gain of 12 pounds since November.  He attends physical therapy twice a week, which has helped him walk without a cane, although he feels exhausted afterward. He uses  a cane more frequently in the morning and a walker only when necessary.  Wt Readings from Last 3 Encounters:  02/03/24 192 lb 9.6 oz (87.4 kg)  01/04/24 180 lb (81.6 kg)  12/21/23 190 lb 9.6 oz (86.5 kg)           Social history:  Relevant past medical, surgical, family and social history reviewed and updated as indicated. Interim medical history since our last visit reviewed.  Allergies and medications reviewed and updated.  DATA REVIEWED: CHART IN EPIC     ROS: Negative unless specifically indicated above in HPI.    Current Outpatient Medications:    amoxicillin -clavulanate (AUGMENTIN ) 875-125 MG tablet, Take 1 tablet by mouth 2 (two) times daily., Disp: 20 tablet, Rfl: 0   celecoxib  (CELEBREX ) 100 MG capsule, Take 100 mg by mouth 2 (two) times daily., Disp: , Rfl:    diclofenac  Sodium (VOLTAREN ) 1 % GEL, Apply 2 g topically at bedtime., Disp: 100 g, Rfl: 2   fexofenadine  (ALLEGRA ) 180 MG tablet, TAKE 1 TABLET BY MOUTH EVERY DAY, Disp: 90 tablet, Rfl: 1   furosemide  (LASIX ) 20 MG tablet, TAKE 1 TABLET (20 MG TOTAL) BY MOUTH DAILY. TAKE 1 TABLET (20 MG TOTAL) BY MOUTH DAILY FOR 7 DAYS. (Patient taking differently: Take by mouth daily as needed. Take 1 tablet (20 mg total) by mouth daily for 7 days.), Disp: 90 tablet, Rfl: 2   gabapentin  (NEURONTIN ) 100 MG capsule, Take 1 capsule (100 mg total) by mouth 3 (three) times  daily. (Patient taking differently: Take 100 mg by mouth 3 (three) times daily as needed.), Disp: 90 capsule, Rfl: 3   Hypromellose (ARTIFICIAL TEARS OP), Place 1 drop into both eyes 2 (two) times daily., Disp: , Rfl:    Multiple Vitamin (MULTIVITAMIN PO), Take by mouth daily., Disp: , Rfl:    Omega-3 Fatty Acids (FISH OIL) 1000 MG CAPS, Take 1,000 mg by mouth every evening., Disp: , Rfl:    omeprazole  (PRILOSEC) 20 MG capsule, TAKE 1 CAPSULE BY MOUTH EVERY DAY, Disp: 90 capsule, Rfl: 1   rosuvastatin  (CRESTOR ) 5 MG tablet, TAKE 1 TABLET AT BEDTIME, Disp: 90  tablet, Rfl: 1   SYNTHROID  137 MCG tablet, TAKE 1 TABLET DAILY BEFORE BREAKFAST, Disp: 90 tablet, Rfl: 3   tamsulosin  (FLOMAX ) 0.4 MG CAPS capsule, TAKE 1 CAPSULE TWICE A DAY, Disp: 180 capsule, Rfl: 3   valsartan  (DIOVAN ) 320 MG tablet, TAKE 1 TABLET BY MOUTH EVERY DAY, Disp: 90 tablet, Rfl: 3        Objective:        BP 116/62 (BP Location: Left Arm, Patient Position: Sitting, Cuff Size: Large)   Temp 98 F (36.7 C) (Temporal)   Ht 5' 10 (1.778 m)   Wt 192 lb 9.6 oz (87.4 kg)   BMI 27.64 kg/m   Physical Exam CHEST: No wheezing or significant fluid in lungs. CARDIOVASCULAR: Heart murmur present. EXTREMITIES: Pitting edema in extremities.  Wt Readings from Last 3 Encounters:  02/03/24 192 lb 9.6 oz (87.4 kg)  01/04/24 180 lb (81.6 kg)  12/21/23 190 lb 9.6 oz (86.5 kg)    Physical Exam Vitals reviewed.  Constitutional:      General: He is not in acute distress.    Appearance: Normal appearance. He is obese. He is not ill-appearing, toxic-appearing or diaphoretic.  HENT:     Head: Normocephalic.     Right Ear: Tympanic membrane normal.     Left Ear: Tympanic membrane normal.     Nose: Nose normal.     Mouth/Throat:     Mouth: Mucous membranes are moist.  Eyes:     Pupils: Pupils are equal, round, and reactive to light.  Cardiovascular:     Rate and Rhythm: Normal rate and regular rhythm.  Pulmonary:     Effort: Pulmonary effort is normal.     Breath sounds: Normal breath sounds. No wheezing.  Musculoskeletal:        General: Normal range of motion.     Cervical back: Normal range of motion.     Right lower leg: 3+ Pitting Edema present.     Left lower leg: 2+ Pitting Edema present.  Neurological:     General: No focal deficit present.     Mental Status: He is alert and oriented to person, place, and time. Mental status is at baseline.  Psychiatric:        Mood and Affect: Mood normal.        Behavior: Behavior normal.        Thought Content: Thought  content normal.        Judgment: Judgment normal.          Results LABS GFR: 13 BNP: Normal  DIAGNOSTIC Echocardiogram: Preserved ejection fraction, mild aortic valve stenosis, and mild aortic regurgitation not causing hemodynamic compromise (08/2023)  Assessment & Plan:   Assessment and Plan Assessment & Plan Acute lower respiratory infection with cough and wheezing Persistent dry cough with wheezing, particularly in the morning and evening, with green  nasal mucus. Symptoms have worsened over the past two weeks, suggesting a possible bacterial infection. No fever, ear pain, or sore throat. No wheezing or fluid in the lungs on examination, reducing the likelihood of pneumonia. Previous pneumonia episodes noted. - Prescribed Augmentin  875 mg orally twice a day for 10 days to prevent progression to pneumonia. - Advised to monitor for improvement and discontinue furosemide  once symptoms improve.  Lower extremity edema Persistent swelling in the left leg, with pitting edema. No significant change in neuropathy symptoms. Recent weight gain noted, possibly due to fluid retention. Previous echocardiogram showed preserved ejection fraction and mild aortic valve regurgitation, not indicative of heart failure. - Continue furosemide  20 mg orally daily for 3-7 days to manage fluid retention. - Advised wearing compression stockings if possible. - Instructed to elevate legs when sitting to reduce swelling. - Instructed to monitor weight for abnormal gains of more than 2 pounds in one day or greater than 5 pounds in a week.  Type 2 diabetes mellitus Last A1c was checked in July. Follow-up A1c is planned for January 5th to monitor glycemic control. - Scheduled follow-up A1c test for January 5th.  Chronic kidney disease, stage 5 GFR previously noted to be decreasing, prompting nephrologist to discontinue spironolactone. Follow-up with nephrologist is scheduled for further evaluation and  management. - Continue follow-up with nephrologist as scheduled.  Heart murmur due to mild aortic valve regurgitation Murmur attributed to mild aortic valve regurgitation, not causing significant issues. Previous echocardiogram showed preserved ejection fraction and mild aortic valve regurgitation, not indicative of heart failure. Continue f/u with cardiology          Return in about 3 months (around 05/03/2024).     Ginger Patrick, MSN, APRN, FNP-C [facility]

## 2024-02-06 ENCOUNTER — Encounter (INDEPENDENT_AMBULATORY_CARE_PROVIDER_SITE_OTHER): Admitting: Ophthalmology

## 2024-02-06 DIAGNOSIS — I10 Essential (primary) hypertension: Secondary | ICD-10-CM | POA: Diagnosis not present

## 2024-02-06 DIAGNOSIS — H43813 Vitreous degeneration, bilateral: Secondary | ICD-10-CM | POA: Diagnosis not present

## 2024-02-06 DIAGNOSIS — M6281 Muscle weakness (generalized): Secondary | ICD-10-CM | POA: Diagnosis not present

## 2024-02-06 DIAGNOSIS — E113313 Type 2 diabetes mellitus with moderate nonproliferative diabetic retinopathy with macular edema, bilateral: Secondary | ICD-10-CM | POA: Diagnosis not present

## 2024-02-06 DIAGNOSIS — R2681 Unsteadiness on feet: Secondary | ICD-10-CM | POA: Diagnosis not present

## 2024-02-06 DIAGNOSIS — H35033 Hypertensive retinopathy, bilateral: Secondary | ICD-10-CM

## 2024-02-06 DIAGNOSIS — Z7984 Long term (current) use of oral hypoglycemic drugs: Secondary | ICD-10-CM | POA: Diagnosis not present

## 2024-02-08 DIAGNOSIS — R2681 Unsteadiness on feet: Secondary | ICD-10-CM | POA: Diagnosis not present

## 2024-02-08 DIAGNOSIS — M6281 Muscle weakness (generalized): Secondary | ICD-10-CM | POA: Diagnosis not present

## 2024-02-15 DIAGNOSIS — R2681 Unsteadiness on feet: Secondary | ICD-10-CM | POA: Diagnosis not present

## 2024-02-15 DIAGNOSIS — M6281 Muscle weakness (generalized): Secondary | ICD-10-CM | POA: Diagnosis not present

## 2024-02-20 ENCOUNTER — Ambulatory Visit: Payer: Self-pay | Admitting: Family

## 2024-02-20 NOTE — Telephone Encounter (Signed)
" °  FYI Only or Action Required?: Action required by provider: Medication Request.  Patient was last seen in primary care on 02/03/2024 by Corwin Antu, FNP.  Called Nurse Triage reporting Cough.  Symptoms began several weeks ago.  Interventions attempted: Prescription medications: Amoxicillin .  Symptoms are: gradually worsening.  Triage Disposition: See Physician Within 24 Hours  Patient/caregiver understands and will follow disposition?: Yes   Copied from CRM #[id number]. Topic: Clinical - Red Word Triage >> Feb 20, 2024 12:26 PM Alfonso ORN wrote: Red Word that prompted transfer to Nurse Triage: bad cough , worsening since after completing rx Reason for Disposition  [1] Continuous (nonstop) coughing interferes with work or school AND [2] no improvement using cough treatment per Care Advice  Answer Assessment - Initial Assessment Questions 1. ONSET: When did the cough begin?      2- 3 weeks  2. SEVERITY: How bad is the cough today?       Worse since onset  3. SPUTUM: Describe the color of your sputum (e.g., none, dry cough; clear, white, yellow, green)     Denies  4. HEMOPTYSIS: Are you coughing up any blood? If Yes, ask: How much? (e.g., flecks, streaks, tablespoons, etc.)     Denies  5. DIFFICULTY BREATHING: Are you having difficulty breathing? If Yes, ask: How bad is it? (e.g., mild, moderate, severe)      Denies  6. FEVER: Do you have a fever? If Yes, ask: What is your temperature, how was it measured, and when did it start?     Denies  7. CARDIAC HISTORY: Do you have any history of heart disease? (e.g., heart attack, congestive heart failure)       Pt has hx of HTN  8. LUNG HISTORY: Do you have any history of lung disease?  (e.g., pulmonary embolus, asthma, emphysema)      Pt has hx of rhinitis  9. PE RISK FACTORS: Do you have a history of blood clots? (or: recent major surgery, recent prolonged travel, bedridden)      Pt does not have PE risk  factors  10. OTHER SYMPTOMS: Pt denies wheezing, chest pain      Pt has runny nose occasionally   Pt reports cough Pt states he was taking  Amoxicillin , however, completed 3-4 days ago Pt offered an in clinic visit on 12.24.25, however, pt declines stating he would prefer to have a medication to assist with cough sent to pharmacy instead. Pt advised to try honey as natural cough suppressant.  Pt agrees with plan of care, will call back for any worsening symptoms  Protocols used: Cough - Acute Non-Productive-A-AH  "

## 2024-02-20 NOTE — Telephone Encounter (Signed)
 LM for pt to return my call.  Please provide message from provider/office when call is returned from patient.

## 2024-02-24 NOTE — Telephone Encounter (Signed)
 Copied from CRM [id number]. Topic: Clinical - Medical Advice >> Feb 22, 2024 12:56 PM Anairis L wrote: Reason for CRM: Patient took over the counter medication and is feeling much better, will call in Monday if needed.    Read providers msg to him.

## 2024-02-27 ENCOUNTER — Telehealth: Payer: Self-pay | Admitting: Family

## 2024-02-27 ENCOUNTER — Encounter: Payer: Self-pay | Admitting: Student in an Organized Health Care Education/Training Program

## 2024-02-27 ENCOUNTER — Ambulatory Visit (INDEPENDENT_AMBULATORY_CARE_PROVIDER_SITE_OTHER)
Admission: RE | Admit: 2024-02-27 | Discharge: 2024-02-27 | Disposition: A | Discharge status: Home / Self Care | Source: Ambulatory Visit | Attending: Family | Admitting: Family

## 2024-02-27 ENCOUNTER — Encounter: Payer: Self-pay | Admitting: Family

## 2024-02-27 ENCOUNTER — Ambulatory Visit: Admitting: Family

## 2024-02-27 ENCOUNTER — Other Ambulatory Visit: Payer: Self-pay

## 2024-02-27 ENCOUNTER — Ambulatory Visit (HOSPITAL_BASED_OUTPATIENT_CLINIC_OR_DEPARTMENT_OTHER): Admitting: Student in an Organized Health Care Education/Training Program

## 2024-02-27 ENCOUNTER — Ambulatory Visit
Admission: RE | Admit: 2024-02-27 | Discharge: 2024-02-27 | Disposition: A | Discharge status: Home / Self Care | Source: Ambulatory Visit | Attending: Student in an Organized Health Care Education/Training Program | Admitting: Student in an Organized Health Care Education/Training Program

## 2024-02-27 VITALS — BP 122/76 | HR 77 | Temp 98.0°F | Ht 70.0 in | Wt 192.0 lb

## 2024-02-27 VITALS — BP 110/60 | HR 77 | Temp 97.2°F | Resp 15 | Ht 70.0 in | Wt 186.0 lb

## 2024-02-27 DIAGNOSIS — M75102 Unspecified rotator cuff tear or rupture of left shoulder, not specified as traumatic: Secondary | ICD-10-CM | POA: Diagnosis not present

## 2024-02-27 DIAGNOSIS — I499 Cardiac arrhythmia, unspecified: Secondary | ICD-10-CM

## 2024-02-27 DIAGNOSIS — I152 Hypertension secondary to endocrine disorders: Secondary | ICD-10-CM | POA: Diagnosis not present

## 2024-02-27 DIAGNOSIS — R600 Localized edema: Secondary | ICD-10-CM

## 2024-02-27 DIAGNOSIS — M19012 Primary osteoarthritis, left shoulder: Secondary | ICD-10-CM | POA: Insufficient documentation

## 2024-02-27 DIAGNOSIS — R051 Acute cough: Secondary | ICD-10-CM

## 2024-02-27 DIAGNOSIS — M12812 Other specific arthropathies, not elsewhere classified, left shoulder: Secondary | ICD-10-CM | POA: Diagnosis present

## 2024-02-27 DIAGNOSIS — G894 Chronic pain syndrome: Secondary | ICD-10-CM | POA: Diagnosis present

## 2024-02-27 LAB — TSH: TSH: 0.85 u[IU]/mL (ref 0.35–5.50)

## 2024-02-27 LAB — CBC WITH DIFFERENTIAL/PLATELET
Basophils Absolute: 0 K/uL (ref 0.0–0.1)
Basophils Relative: 0.3 % (ref 0.0–3.0)
Eosinophils Absolute: 0.1 K/uL (ref 0.0–0.7)
Eosinophils Relative: 2.4 % (ref 0.0–5.0)
HCT: 32.4 % — ABNORMAL LOW (ref 39.0–52.0)
Hemoglobin: 10.8 g/dL — ABNORMAL LOW (ref 13.0–17.0)
Lymphocytes Relative: 18 % (ref 12.0–46.0)
Lymphs Abs: 1.1 K/uL (ref 0.7–4.0)
MCHC: 33.4 g/dL (ref 30.0–36.0)
MCV: 91 fl (ref 78.0–100.0)
Monocytes Absolute: 0.3 K/uL (ref 0.1–1.0)
Monocytes Relative: 5.4 % (ref 3.0–12.0)
Neutro Abs: 4.6 K/uL (ref 1.4–7.7)
Neutrophils Relative %: 73.9 % (ref 43.0–77.0)
Platelets: 255 K/uL (ref 150.0–400.0)
RBC: 3.56 Mil/uL — ABNORMAL LOW (ref 4.22–5.81)
RDW: 12.6 % (ref 11.5–15.5)
WBC: 6.2 K/uL (ref 4.0–10.5)

## 2024-02-27 LAB — BASIC METABOLIC PANEL WITH GFR
BUN: 28 mg/dL — ABNORMAL HIGH (ref 6–23)
CO2: 25 meq/L (ref 19–32)
Calcium: 8.9 mg/dL (ref 8.4–10.5)
Chloride: 99 meq/L (ref 96–112)
Creatinine, Ser: 1.15 mg/dL (ref 0.40–1.50)
GFR: 56.62 mL/min — ABNORMAL LOW
Glucose, Bld: 103 mg/dL — ABNORMAL HIGH (ref 70–99)
Potassium: 5.5 meq/L — ABNORMAL HIGH (ref 3.5–5.1)
Sodium: 132 meq/L — ABNORMAL LOW (ref 135–145)

## 2024-02-27 LAB — BRAIN NATRIURETIC PEPTIDE: Pro B Natriuretic peptide (BNP): 143 pg/mL — ABNORMAL HIGH (ref 1.0–100.0)

## 2024-02-27 MED ORDER — DEXAMETHASONE SOD PHOSPHATE PF 10 MG/ML IJ SOLN
10.0000 mg | Freq: Once | INTRAMUSCULAR | Status: AC
  Administered 2024-02-27: 10 mg

## 2024-02-27 MED ORDER — LIDOCAINE HCL 2 % IJ SOLN
20.0000 mL | Freq: Once | INTRAMUSCULAR | Status: AC
  Administered 2024-02-27: 400 mg
  Filled 2024-02-27: qty 40

## 2024-02-27 MED ORDER — ROPIVACAINE HCL 2 MG/ML IJ SOLN
9.0000 mL | Freq: Once | INTRAMUSCULAR | Status: AC
  Administered 2024-02-27: 9 mL via PERINEURAL
  Filled 2024-02-27: qty 20

## 2024-02-27 NOTE — Progress Notes (Signed)
 PROVIDER NOTE: Interpretation of information contained herein should be left to medically-trained personnel. Specific patient instructions are provided elsewhere under Patient Instructions section of medical record. This document was created in part using STT-dictation technology, any transcriptional errors that may result from this process are unintentional.  Patient: Kyle Simon Type: Established DOB: 06/16/1932 MRN: [id number] PCP: Corwin Antu, FNP  Service: Procedure DOS: 02/27/2024 Setting: Ambulatory Location: Ambulatory outpatient facility Delivery: Face-to-face Provider: Wallie Sherry, MD Specialty: Interventional Pain Management Specialty designation: 09 Location: Outpatient facility Ref. Prov.: Corwin Antu, FNP       Interventional Therapy   Procedure: Suprascapular nerve Radiofrequency Ablation (RFA) #1  Laterality: Left  Level: Superior to scapular spine, lateral to supraspinatus fossa (Suprascapular notch).  Imaging: Fluoroscopic guidance         Anesthesia: Local anesthesia (1-2% Lidocaine ) DOS: 02/27/2024  Performed by: Wallie Sherry, MD  Purpose: Therapeutic Indications: Shoulder pain severe enough to impact quality of life or function. Indications: 1. Left rotator cuff tear arthropathy   2. Arthritis of left glenohumeral joint   3. Chronic pain syndrome    Kyle Simon has been dealing with the above chronic pain for longer than three months and has either failed to respond, was unable to tolerate, or simply did not get enough benefit from other more conservative therapies including, but not limited to: 1. Over-the-counter medications 2. Anti-inflammatory medications 3. Muscle relaxants 4. Membrane stabilizers 5. Opioids 6. Physical therapy and/or chiropractic manipulation 7. Modalities (Heat, ice, etc.) 8. Invasive techniques such as nerve blocks. Kyle Simon has attained more than 50% relief of the pain from a series of diagnostic injections conducted in  separate occasions.  Pain Score: Pre-procedure: 2 /10 Post-procedure: 0-No pain/10     Position / Prep / Materials:  Position: Prone  Prep solution: ChloraPrep (2% chlorhexidine  gluconate and 70% isopropyl alcohol ) Prep Area: Entire shoulder area.  This includes the lateral and posterior aspect of the neck, lateral and posterior aspect of the upper third of the upper arm, from the axilla to the spine in the upper back, down to the lower border of the scapula. Materials:  Tray:  RFA (Radiofrequency) tray Needle(s):  Type: RFA (Teflon-coated radiofrequency ablation needles)  Gauge (G): 22  Length: Regular (10cm) Qty: 1  H&P (Pre-op Assessment):  Kyle Simon is a 89 y.o. (year old), male patient, seen today for interventional treatment. He  has a past surgical history that includes Uvulectomy (1996); Inguinal hernia repair (2002); Tonsillectomy; Prostate surgery (07/2006); Vasectomy; Lipoma excision; Wrist surgery (8040); sinus & throat surgery-1995 (1995); Meniscectomy (Bilateral); Eye surgery (2003); Back surgery (2011); Total knee arthroplasty (01/11/2011); Joint replacement (2012); Cardiac catheterization (09/2001); Rotator cuff repair (Right, 11/2014); Total knee arthroplasty (Right, 11/22/2016); Colonoscopy; Anterior lat lumbar fusion (Right, 11/03/2017); XI robotic assisted ventral hernia (N/A, 09/07/2020); Colonoscopy with propofol  (N/A, 05/05/2022); Colonoscopy (N/A, 05/06/2022); and Spine surgery (2005). Kyle Simon has a current medication list which includes the following prescription(s): amoxicillin -clavulanate, celecoxib , diclofenac  sodium, fexofenadine , furosemide , gabapentin , carboxymethylcellulose sodium, multiple vitamin, fish oil, omeprazole , rosuvastatin , synthroid , tamsulosin , tramadol , and valsartan . His primarily concern today is the Shoulder Pain  Initial Vital Signs:  Pulse/HCG Rate: 77ECG Heart Rate: 72 Temp: (!) 97.2 F (36.2 C) Resp: 16 BP: 102/66 SpO2: 100 %  BMI:  Estimated body mass index is 26.69 kg/m as calculated from the following:   Height as of this encounter: 5' 10 (1.778 m).   Weight as of this encounter: 186 lb (84.4 kg).  Risk Assessment: Allergies: Reviewed. He is  allergic to simvastatin and oxycodone .  Allergy Precautions: None required Coagulopathies: Reviewed. None identified.  Blood-thinner therapy: None at this time Active Infection(s): Reviewed. None identified. Kyle Simon is afebrile  Site Confirmation: Kyle Simon was asked to confirm the procedure and laterality before marking the site Procedure checklist: Completed Consent: Before the procedure and under the influence of no sedative(s), amnesic(s), or anxiolytics, the patient was informed of the treatment options, risks and possible complications. To fulfill our ethical and legal obligations, as recommended by the American Medical Association's Code of Ethics, I have informed the patient of my clinical impression; the nature and purpose of the treatment or procedure; the risks, benefits, and possible complications of the intervention; the alternatives, including doing nothing; the risk(s) and benefit(s) of the alternative treatment(s) or procedure(s); and the risk(s) and benefit(s) of doing nothing. The patient was provided information about the general risks and possible complications associated with the procedure. These may include, but are not limited to: failure to achieve desired goals, infection, bleeding, organ or nerve damage, allergic reactions, paralysis, and death. In addition, the patient was informed of those risks and complications associated to the procedure, such as failure to decrease pain; infection; bleeding; organ or nerve damage with subsequent damage to sensory, motor, and/or autonomic systems, resulting in permanent pain, numbness, and/or weakness of one or several areas of the body; allergic reactions; (i.e.: anaphylactic reaction); and/or death. Furthermore, the  patient was informed of those risks and complications associated with the medications. These include, but are not limited to: allergic reactions (i.e.: anaphylactic or anaphylactoid reaction(s)); adrenal axis suppression; blood sugar elevation that in diabetics may result in ketoacidosis or comma; water  retention that in patients with history of congestive heart failure may result in shortness of breath, pulmonary edema, and decompensation with resultant heart failure; weight gain; swelling or edema; medication-induced neural toxicity; particulate matter embolism and blood vessel occlusion with resultant organ, and/or nervous system infarction; and/or aseptic necrosis of one or more joints. Finally, the patient was informed that Medicine is not an exact science; therefore, there is also the possibility of unforeseen or unpredictable risks and/or possible complications that may result in a catastrophic outcome. The patient indicated having understood very clearly. We have given the patient no guarantees and we have made no promises. Enough time was given to the patient to ask questions, all of which were answered to the patient's satisfaction. Mr. Molstad has indicated that he wanted to continue with the procedure. Attestation: I, the ordering provider, attest that I have discussed with the patient the benefits, risks, side-effects, alternatives, likelihood of achieving goals, and potential problems during recovery for the procedure that I have provided informed consent. Date  Time: 02/27/2024 10:49 AM  Pre-Procedure Preparation:  Monitoring: As per clinic protocol. Respiration, ETCO2, SpO2, BP, heart rate and rhythm monitor placed and checked for adequate function Safety Precautions: Patient was assessed for positional comfort and pressure points before starting the procedure. Time-out: I initiated and conducted the Time-out before starting the procedure, as per protocol. The patient was asked to  participate by confirming the accuracy of the Time Out information. Verification of the correct person, site, and procedure were performed and confirmed by me, the nursing staff, and the patient. Time-out conducted as per Joint Commission's Universal Protocol (UP.01.01.01). Time: 1130 Start Time: 1131 hrs.  Description/Narrative of Procedure:          Target: Suprascapular nerve as it passes thru the lower portion of the suprascapular notch. Location: Suprascapular notch  Region: Shoulder, suprascapular Approach: Percutaneous   Rationale (medical necessity): procedure needed and proper for the diagnosis and/or treatment of the patient's medical symptoms and needs. Procedural Technique Safety Precautions: Aspiration looking for blood return was conducted prior to all injections. At no point did we inject any substances, as a needle was being advanced. No attempts were made at seeking any paresthesias. Safe injection practices and needle disposal techniques used. Medications properly checked for expiration dates. SDV (single dose vial) medications used. Description of the Procedure: Protocol guidelines were followed. The patient was placed in position over the procedure table. The target area was identified and the area prepped in the usual manner. The skin and muscle were infiltrated with local anesthetic. Appropriate amount of time allowed to pass for local anesthetics to take effect. Radiofrequency needles were introduced to the target area using fluoroscopic guidance. Using the Medtronic Radiofrequency Generator, sensory stimulation using 50 Hz was used to locate & identify the nerve, making sure that the needle was positioned such that there was no sensory stimulation below 0.3 V or above 0.7 V. Stimulation using 2 Hz was used to evaluate the motor component. Care was taken not to lesion any nerves that demonstrated motor stimulation of the lower extremities at an output of less than 2.5 times that  of the sensory threshold, or a maximum of 2.0 V. Once satisfactory placement of the needles was achieved, the numbing solution was slowly injected after negative aspiration. After waiting for at least 2 minutes, the ablation was performed at 42 degrees C for 120 seconds x2, using Pulsed Radiofrequency settings. Once the procedure was completed, the needles were then removed and the area cleansed, making sure to leave some of the prepping solution back to take advantage of its long term bactericidal properties. Intra-operative Compliance: Compliant   Vitals:   02/27/24 1059 02/27/24 1130 02/27/24 1137  BP: 102/66 125/69 110/60  Pulse: 77    Resp: 16 18 15   Temp: (!) 97.2 F (36.2 C)    SpO2: 100% 100% 99%  Weight: 186 lb (84.4 kg)    Height: 5' 10 (1.778 m)      Start Time: 1131 hrs. End Time: 1137 hrs.  Materials & Medications:  Needle(s) Type: Teflon-coated, curved tip, Radiofrequency needle(s) Gauge: 22G Length: 10cm Medication(s): Please see orders for medications and dosing details.  Imaging Guidance (Non-Spinal):          Type of Imaging Technique: Fluoroscopy Guidance (Non-Spinal) Indication(s): Fluoroscopy guidance for needle placement to enhance accuracy in procedures requiring precise needle localization for targeted delivery of medication in or near specific anatomical locations not easily accessible without such real-time imaging assistance. Exposure Time: Please see nurses notes. Contrast: None used. Fluoroscopic Guidance: I was personally present during the use of fluoroscopy. Tunnel Vision Technique used to obtain the best possible view of the target area. Parallax error corrected before commencing the procedure. Direction-depth-direction technique used to introduce the needle under continuous pulsed fluoroscopy. Once target was reached, antero-posterior, oblique, and lateral fluoroscopic projection used confirm needle placement in all planes. Images permanently stored  in EMR. Interpretation: No contrast injected.  Antibiotic Prophylaxis:   Anti-infectives (From admission, onward)    None      Indication(s): None identified  Post-operative Assessment:  Post-procedure Vital Signs:  Pulse/HCG Rate: 7772 Temp: (!) 97.2 F (36.2 C) Resp: 15 BP: 110/60 SpO2: 99 %  EBL: None  Complications: No immediate post-treatment complications observed by team, or reported by patient.  Note: The patient tolerated  the entire procedure well. A repeat set of vitals were taken after the procedure and the patient was kept under observation following institutional policy, for this type of procedure. Post-procedural neurological assessment was performed, showing return to baseline, prior to discharge. The patient was provided with post-procedure discharge instructions, including a section on how to identify potential problems. Should any problems arise concerning this procedure, the patient was given instructions to immediately contact us , at any time, without hesitation. In any case, we plan to contact the patient by telephone for a follow-up status report regarding this interventional procedure.  Comments:  No additional relevant information.  Plan of Care (POC)  Orders:  Orders Placed This Encounter  Procedures   DG PAIN CLINIC C-ARM 1-60 MIN NO REPORT    Intraoperative interpretation by procedural physician at [facility].    Standing Status:   Standing    Number of Occurrences:   1    Reason for exam::   Assistance in needle guidance and placement for procedures requiring needle placement in or near specific anatomical locations not easily accessible without such assistance.     Medications ordered for procedure: Meds ordered this encounter  Medications   lidocaine  (XYLOCAINE ) 2 % (with pres) injection 400 mg   ropivacaine  (PF) 2 mg/mL (0.2%) (NAROPIN ) injection 9 mL   dexamethasone  (DECADRON ) injection 10 mg   Medications administered: We  administered lidocaine , ropivacaine  (PF) 2 mg/mL (0.2%), and dexamethasone .  See the medical record for exact dosing, route, and time of administration.    Qutenza  10/19/2023, left suprascapular nerve block: 11/14/23 (10 mg Decadron ), 12/19/23 (20 mg decadron ). Left pulsed SSN RFA 02/27/24     Follow-up plan:   Return in about 8 weeks (around 04/23/2024) for PPE, F2F (SSN pRFA).     Recent Visits Date Type Provider Dept  01/04/24 Office Visit Marcelino Nurse, MD [facility]  12/19/23 Procedure visit Marcelino Nurse, MD [facility]  12/15/23 Office Visit Marcelino Nurse, MD [facility]  Showing recent visits within past 90 days and meeting all other requirements Today's Visits Date Type Provider Dept  02/27/24 Procedure visit Marcelino Nurse, MD [facility]  Showing today's visits and meeting all other requirements Future Appointments Date Type Provider Dept  04/24/24 Appointment Marcelino Nurse, MD [facility]  Showing future appointments within next 90 days and meeting all other requirements   Disposition: Discharge home  Discharge (Date  Time): 02/27/2024; 1145 hrs.   Primary Care Physician: Corwin Antu, FNP Location: [facility] Note by: Nurse Marcelino, MD (TTS technology used. I apologize for any typographical errors that were not detected and corrected.) Date: 02/27/2024; Time: 11:47 AM  Disclaimer:  Medicine is not an visual merchandiser. The only guarantee in medicine is that nothing is guaranteed. It is important to note that the decision to proceed with this intervention was based on the information collected from the patient. The Data and conclusions were drawn from the patient's questionnaire, the interview, and the physical examination. Because the information was provided in large part by the patient, it cannot be guaranteed that it has not been purposely or unconsciously manipulated. Every effort has been  made to obtain as much relevant data as possible for this evaluation. It is important to note that the conclusions that lead to this procedure are derived in large part from the available data. Always take into account that the treatment will also be dependent on availability of resources and existing treatment guidelines,  considered by other Pain Management Practitioners as being common knowledge and practice, at the time of the intervention. For Medico-Legal purposes, it is also important to point out that variation in procedural techniques and pharmacological choices are the acceptable norm. The indications, contraindications, technique, and results of the above procedure should only be interpreted and judged by a Board-Certified Interventional Pain Specialist with extensive familiarity and expertise in the same exact procedure and technique.

## 2024-02-27 NOTE — Progress Notes (Signed)
 "  Established Patient Office Visit  Subjective:      CC:  Chief Complaint  Patient presents with   Cough    HPI: Kyle Simon is a 89 y.o. male presenting on 02/27/2024 for Cough .  Discussed the use of AI scribe software for clinical note transcription with the patient, who gave verbal consent to proceed.  History of Present Illness Kyle Simon is an 89 year old male who presents with persistent cough and allergy symptoms.  He has a persistent cough that typically starts around one or two o'clock and can be quite severe, lingering for extended periods. The cough is mostly dry, but occasionally he feels a little phlegm in his throat without expectoration. No increase in shortness of breath or wheezing is noted. Previous antibiotic treatment provided temporary relief, with symptoms improving for about three to four days before returning.  Along with the cough, he experiences rhinorrhea and sneezing, which he associates with allergy symptoms. He has been taking Allegra  daily but feels it is not effective and questions if he has allergies. He mentions that his granddaughter brought home an Australian Shepherd dog, and while this could potentially contribute to his symptoms, he is unsure if he is allergic to the dog.  He has a history of aortic atherosclerosis, hypertension, edema, chronic dyspnea, and a heart murmur. He takes furosemide  daily for edema. He also mentions a history of irregular heartbeat and has undergone an EKG in the past.  He has had a recent radiofrequency ablation for a left rotator cuff tear and arthropathy, performed without general anesthesia. His shoulder feels okay currently, but he is cautious as previous treatments have provided only temporary relief.  He spends most of his day in a chair and enjoys shopping on [employer] as he cannot go out shopping.         Social history:  Relevant past medical, surgical, family and social history reviewed  and updated as indicated. Interim medical history since our last visit reviewed.  Allergies and medications reviewed and updated.  DATA REVIEWED: CHART IN EPIC     ROS: Negative unless specifically indicated above in HPI.   Current Medications[1]        Objective:        BP 122/76 (BP Location: Left Arm, Patient Position: Sitting, Cuff Size: Large)   Pulse 77   Temp 98 F (36.7 C) (Temporal)   Ht 5' 10 (1.778 m)   Wt 192 lb (87.1 kg)   SpO2 99%   BMI 27.55 kg/m   Physical Exam VITALS: P- 50 CARDIOVASCULAR: Sinus bradycardia, heart rate 50 bpm. EXTREMITIES: Mild edema in extremities.  Wt Readings from Last 3 Encounters:  02/27/24 192 lb (87.1 kg)  02/27/24 186 lb (84.4 kg)  02/03/24 192 lb 9.6 oz (87.4 kg)    Physical Exam Vitals reviewed.  Constitutional:      General: He is not in acute distress.    Appearance: Normal appearance. He is obese. He is not ill-appearing, toxic-appearing or diaphoretic.  HENT:     Head: Normocephalic.     Right Ear: Tympanic membrane normal.     Left Ear: Tympanic membrane normal.     Nose: Nose normal.     Mouth/Throat:     Mouth: Mucous membranes are moist.  Eyes:     Pupils: Pupils are equal, round, and reactive to light.  Cardiovascular:     Rate and Rhythm: Normal rate and regular rhythm. Occasional Extrasystoles are present.  Pulmonary:     Effort: Pulmonary effort is normal.     Breath sounds: Normal breath sounds. No wheezing.  Musculoskeletal:        General: Normal range of motion.     Cervical back: Normal range of motion.  Neurological:     General: No focal deficit present.     Mental Status: He is alert and oriented to person, place, and time. Mental status is at baseline.  Psychiatric:        Mood and Affect: Mood normal.        Behavior: Behavior normal.        Thought Content: Thought content normal.        Judgment: Judgment normal.          Results Diagnostic Echocardiogram (08/2023):  Well-preserved ejection fraction, mild aortic valve sclerosis, mild aortic regurgitation EKG (05/13/2023): Sinus bradycardia, QT interval 422 ms, PR interval 150 ms, ventricular rate 47 bpm  Electrocardiogram (EKG) Sinus bradycardia. Heart rate 50 beats per minute. PR interval 206 ms. QT interval 418 ms.  Assessment & Plan:   Assessment and Plan Assessment & Plan Acute upper respiratory infection with allergic features Persistent cough with rhinorrhea and sneezing, suggestive of allergic features. Symptoms improved temporarily with antibiotics but have recurred. Current treatment with Allegra  is ineffective. Possible exacerbation due to exposure to a new dog. - Switch from Allegra  to Zyrtec (cetirizine) for allergy management. -cxr today to r/o infection, suspect allergy related  -cbc ordered to r/u infection   Cardiac arrhythmia Sinus bradycardia with a heart rate of 50 bpm. Recent EKG shows PR interval of 206 ms and QT interval of 418 ms. No acute symptoms reported. - Performed EKG today to monitor cardiac rhythm. -reassuring stable from prior   Chronic lower extremity edema Chronic edema managed with furosemide . Compression socks have not been used due to difficulty in application. - Continue furosemide  as prescribed. - Encouraged use of compression socks.        Return if symptoms worsen or fail to improve.     Ginger Patrick, MSN, APRN, FNP-C [facility]        [1]  Current Outpatient Medications:    celecoxib  (CELEBREX ) 100 MG capsule, Take 100 mg by mouth 2 (two) times daily., Disp: , Rfl:    diclofenac  Sodium (VOLTAREN ) 1 % GEL, Apply 2 g topically at bedtime., Disp: 100 g, Rfl: 2   fexofenadine  (ALLEGRA ) 180 MG tablet, TAKE 1 TABLET BY MOUTH EVERY DAY, Disp: 90 tablet, Rfl: 1   furosemide  (LASIX ) 20 MG tablet, TAKE 1 TABLET (20 MG TOTAL) BY MOUTH DAILY. TAKE 1 TABLET (20 MG TOTAL) BY MOUTH DAILY FOR 7 DAYS. (Patient taking  differently: Take by mouth daily as needed. Take 1 tablet (20 mg total) by mouth daily for 7 days.), Disp: 90 tablet, Rfl: 2   gabapentin  (NEURONTIN ) 100 MG capsule, Take 1 capsule (100 mg total) by mouth 3 (three) times daily. (Patient taking differently: Take 100 mg by mouth 3 (three) times daily as needed.), Disp: 90 capsule, Rfl: 3   Hypromellose (ARTIFICIAL TEARS OP), Place 1 drop into both eyes 2 (two) times daily., Disp: , Rfl:    Multiple Vitamin (MULTIVITAMIN PO), Take by mouth daily., Disp: , Rfl:    Omega-3 Fatty Acids (FISH OIL) 1000 MG CAPS, Take 1,000 mg by mouth every evening., Disp: , Rfl:    omeprazole  (PRILOSEC) 20 MG capsule, TAKE 1 CAPSULE BY MOUTH EVERY DAY, Disp: 90 capsule, Rfl:  1   rosuvastatin  (CRESTOR ) 5 MG tablet, TAKE 1 TABLET AT BEDTIME, Disp: 90 tablet, Rfl: 1   SYNTHROID  137 MCG tablet, TAKE 1 TABLET DAILY BEFORE BREAKFAST, Disp: 90 tablet, Rfl: 3   tamsulosin  (FLOMAX ) 0.4 MG CAPS capsule, TAKE 1 CAPSULE TWICE A DAY, Disp: 180 capsule, Rfl: 3   traMADol  (ULTRAM ) 50 MG tablet, Take 1 tablet (50 mg total) by mouth every 12 (twelve) hours as needed for moderate pain (pain score 4-6)., Disp: 60 tablet, Rfl: 2   valsartan  (DIOVAN ) 320 MG tablet, TAKE 1 TABLET BY MOUTH EVERY DAY, Disp: 90 tablet, Rfl: 3  "

## 2024-02-27 NOTE — Patient Instructions (Signed)
" °  Start some zyrtec in place of allegra  daily.   "

## 2024-02-27 NOTE — Patient Instructions (Signed)
 ______________________________________________________________________    Post-Radiofrequency (RF) Discharge Instructions  You have just completed a Radiofrequency Neurotomy.  The following instructions will provide you with information and guidelines for self-care upon discharge.  If at any time you have questions or concerns please call your physician. DO NOT DRIVE YOURSELF!!  Instructions: Apply ice: Fill a plastic sandwich bag with crushed ice. Cover it with a small towel and apply to injection site. Apply for 15 minutes then remove x 15 minutes. Repeat sequence on day of procedure, until you go to bed. The purpose is to minimize swelling and discomfort after procedure. Apply heat: Apply heat to procedure site starting the day following the procedure. The purpose is to treat any soreness and discomfort from the procedure. Food intake: No eating limitations, unless stipulated above.  Nevertheless, if you have had sedation, you may experience some nausea.  In this case, it may be wise to wait at least two hours prior to resuming regular diet. Physical activities: Keep activities to a minimum for the first 8 hours after the procedure. For the first 24 hours after the procedure, do not drive a motor vehicle,  Operate heavy machinery, power tools, or handle any weapons.  Consider walking with the use of an assistive device or accompanied by an adult for the first 24 hours.  Do not drink alcoholic beverages including beer.  Do not make any important decisions or sign any legal documents. Go home and rest today.  Resume activities tomorrow, as tolerated.  Use caution in moving about as you may experience mild leg weakness.  Use caution in cooking, use of household electrical appliances and climbing steps. Driving: If you have received any sedation, you are not allowed to drive for 24 hours after your procedure. Blood thinner: Restart your blood thinner 6 hours after your procedure. (Only for those taking  blood thinners) Insulin : As soon as you can eat, you may resume your normal dosing schedule. (Only for those taking insulin ) Medications: May resume pre-procedure medications.  Do not take any drugs, other than what has been prescribed to you. Infection prevention: Keep procedure site clean and dry. Post-procedure Pain Diary: Extremely important that this be done correctly and accurately. Recorded information will be used to determine the next step in treatment. Pain evaluated is that of treated area only. Do not include pain from an untreated area. Complete every hour, on the hour, for the initial 8 hours. Set an alarm to help you do this part accurately. Do not go to sleep and have it completed later. It will not be accurate. Follow-up appointment: Keep your follow-up appointment after the procedure. Usually 2-6 weeks after radiofrequency. Bring you pain diary. The information collected will be essential for your long-term care.   Expect: From numbing medicine (AKA: Local Anesthetics): Numbness or decrease in pain. Onset: Full effect within 15 minutes of injected. Duration: It will depend on the type of local anesthetic used. On the average, 1 to 8 hours.  From steroids (when added): Decrease in swelling or inflammation. Once inflammation is improved, relief of the pain will follow. Onset of benefits: Depends on the amount of swelling present. The more swelling, the longer it will take for the benefits to be seen. In some cases, up to 10 days. Duration: Steroids will stay in the system x 2 weeks. Duration of benefits will depend on multiple posibilities including persistent irritating factors. From procedure: Some discomfort is to be expected once the numbing medicine wears off. In the case of  radiofrequency procedures, this may last as long as 6-8 weeks. Additional post-procedure pain medication is provided for this. Discomfort is minimized if ice and heat are applied as instructed.  Call  if: You experience numbness and weakness that gets worse with time, as opposed to wearing off. He experience any unusual bleeding, difficulty breathing, or loss of the ability to control your bowel and bladder. (This applies to Spinal procedures only) You experience any redness, swelling, heat, red streaks, elevated temperature, fever, or any other signs of a possible infection.  Emergency Numbers: Durning business hours (Monday - Thursday, 8:00 AM - 4:00 PM) (Friday, 9:00 AM - 12:00 Noon): [phone] After hours: [phone] ______________________________________________________________________

## 2024-02-27 NOTE — Telephone Encounter (Signed)
 Can we please add on bnp from todays labs?

## 2024-02-28 ENCOUNTER — Telehealth: Payer: Self-pay | Admitting: *Deleted

## 2024-02-28 ENCOUNTER — Ambulatory Visit: Payer: Self-pay | Admitting: Family

## 2024-02-28 DIAGNOSIS — E875 Hyperkalemia: Secondary | ICD-10-CM

## 2024-02-28 NOTE — Telephone Encounter (Signed)
 Called for post procedure follow-up. Message left.

## 2024-02-28 NOTE — Progress Notes (Signed)
 noted

## 2024-03-05 NOTE — Telephone Encounter (Signed)
 Spoke with pt's wife. She is aware of the pt's lab and CXR results. OV has been scheduled with Tabitha for 03/14/24 at 1040.

## 2024-03-06 NOTE — Addendum Note (Signed)
 Addended by: CORWIN ANTU on: 03/06/2024 10:53 AM   Modules accepted: Level of Service

## 2024-03-12 ENCOUNTER — Other Ambulatory Visit: Payer: Self-pay | Admitting: Family

## 2024-03-14 ENCOUNTER — Ambulatory Visit: Admitting: Family

## 2024-03-21 ENCOUNTER — Encounter: Payer: Self-pay | Admitting: Family

## 2024-03-21 ENCOUNTER — Ambulatory Visit: Admitting: Family

## 2024-03-21 VITALS — BP 102/62 | Ht 70.0 in | Wt 193.8 lb

## 2024-03-21 DIAGNOSIS — E1140 Type 2 diabetes mellitus with diabetic neuropathy, unspecified: Secondary | ICD-10-CM

## 2024-03-21 DIAGNOSIS — D649 Anemia, unspecified: Secondary | ICD-10-CM

## 2024-03-21 DIAGNOSIS — L97521 Non-pressure chronic ulcer of other part of left foot limited to breakdown of skin: Secondary | ICD-10-CM

## 2024-03-21 DIAGNOSIS — R0609 Other forms of dyspnea: Secondary | ICD-10-CM | POA: Diagnosis not present

## 2024-03-21 DIAGNOSIS — L03032 Cellulitis of left toe: Secondary | ICD-10-CM | POA: Diagnosis not present

## 2024-03-21 DIAGNOSIS — R7989 Other specified abnormal findings of blood chemistry: Secondary | ICD-10-CM | POA: Diagnosis not present

## 2024-03-21 DIAGNOSIS — R053 Chronic cough: Secondary | ICD-10-CM

## 2024-03-21 DIAGNOSIS — E875 Hyperkalemia: Secondary | ICD-10-CM

## 2024-03-21 LAB — CBC WITH DIFFERENTIAL/PLATELET
Basophils Absolute: 0 K/uL (ref 0.0–0.1)
Basophils Relative: 0.6 % (ref 0.0–3.0)
Eosinophils Absolute: 0.3 K/uL (ref 0.0–0.7)
Eosinophils Relative: 5.8 % — ABNORMAL HIGH (ref 0.0–5.0)
HCT: 32 % — ABNORMAL LOW (ref 39.0–52.0)
Hemoglobin: 10.8 g/dL — ABNORMAL LOW (ref 13.0–17.0)
Lymphocytes Relative: 24.9 % (ref 12.0–46.0)
Lymphs Abs: 1.2 K/uL (ref 0.7–4.0)
MCHC: 33.8 g/dL (ref 30.0–36.0)
MCV: 90.8 fl (ref 78.0–100.0)
Monocytes Absolute: 0.5 K/uL (ref 0.1–1.0)
Monocytes Relative: 10.8 % (ref 3.0–12.0)
Neutro Abs: 2.8 K/uL (ref 1.4–7.7)
Neutrophils Relative %: 57.9 % (ref 43.0–77.0)
Platelets: 244 K/uL (ref 150.0–400.0)
RBC: 3.52 Mil/uL — ABNORMAL LOW (ref 4.22–5.81)
RDW: 12.3 % (ref 11.5–15.5)
WBC: 4.9 K/uL (ref 4.0–10.5)

## 2024-03-21 LAB — BASIC METABOLIC PANEL WITH GFR
BUN: 25 mg/dL — ABNORMAL HIGH (ref 6–23)
CO2: 24 meq/L (ref 19–32)
Calcium: 8.9 mg/dL (ref 8.4–10.5)
Chloride: 98 meq/L (ref 96–112)
Creatinine, Ser: 1.1 mg/dL (ref 0.40–1.50)
GFR: 59.7 mL/min — ABNORMAL LOW
Glucose, Bld: 163 mg/dL — ABNORMAL HIGH (ref 70–99)
Potassium: 4.6 meq/L (ref 3.5–5.1)
Sodium: 128 meq/L — ABNORMAL LOW (ref 135–145)

## 2024-03-21 LAB — BRAIN NATRIURETIC PEPTIDE: Pro B Natriuretic peptide (BNP): 102 pg/mL — ABNORMAL HIGH (ref 1.0–100.0)

## 2024-03-21 LAB — VITAMIN B12: Vitamin B-12: 709 pg/mL (ref 211–911)

## 2024-03-21 MED ORDER — MONTELUKAST SODIUM 10 MG PO TABS: 10.0000 mg | ORAL_TABLET | Freq: Every day | ORAL | 3 refills | Status: AC

## 2024-03-21 MED ORDER — DOXYCYCLINE HYCLATE 100 MG PO TABS: 100.0000 mg | ORAL_TABLET | Freq: Two times a day (BID) | ORAL | 0 refills | Status: DC

## 2024-03-21 NOTE — Patient Instructions (Addendum)
" °  Continue zyrtec over the counter Start singulair  10 mg nightly   Watch out for depression symptoms or changes in moods for the singulair    Gave antibiotic for doxycycline  for your toe as it looks infection  Please monitor site for worsening signs/symptoms of infection to include: increasing redness, increasing tenderness, increase in size, and or pustulant drainage from site. If this is to occur please let me know immediately.   Follow up with Dr. Marcelino for nerve pain and also for your ulceration as needed "

## 2024-03-21 NOTE — Progress Notes (Signed)
 "  Established Patient Office Visit  Subjective:      CC:  Chief Complaint  Patient presents with   Follow-up    2 week follow up    HPI: Kyle Simon is a 89 y.o. male presenting on 03/21/2024 for Follow-up (2 week follow up) .  Discussed the use of AI scribe software for clinical note transcription with the patient, who gave verbal consent to proceed.  History of Present Illness Kyle Simon is an 89 year old male who presents with persistent cough and neuropathic pain.  He has experienced a persistent cough for about two months, which has improved with the use of Mucinex taken once daily. However, he still experiences a wheeze in his throat, particularly at night. No fever, sore throat, ear pain, chest tightness, or shortness of breath reported by the patient. He has not seen a pulmonary specialist for this issue.  He has a history of neuropathy causing burning and pain in his feet, particularly at night. He takes gabapentin  100 mg at night, but it has not alleviated his symptoms. He experiences burning or pain in his toes or heels, which wakes him up at night. Relief is found by sitting in a chair or hanging his feet over the edge of the bed. He has seen a neurologist for this condition in the past.  He takes furosemide  daily for leg swelling, which has improved, although his left foot sometimes swells. There is a scab on his big toe that has been present intermittently for several months. He has seen a podiatrist for this issue.  No recent weight loss or gain, with weight stable between 182 to 185 pounds. He monitors his blood sugar, which remains between 80 to 90 mg/dL. His last A1c was 6.6 in October.  He has stopped physical therapy as he feels it no longer benefits him. He uses a cane outdoors but can walk without it indoors. He has hearing aids and is adjusting to their use.         Social history:  Relevant past medical, surgical, family and social  history reviewed and updated as indicated. Interim medical history since our last visit reviewed.  Allergies and medications reviewed and updated.  DATA REVIEWED: CHART IN EPIC     ROS: Negative unless specifically indicated above in HPI.   Current Medications[1]        Objective:        BP 102/62 (BP Location: Left Arm, Patient Position: Sitting, Cuff Size: Large)   Ht 5' 10 (1.778 m)   Wt 193 lb 12.8 oz (87.9 kg)   BMI 27.81 kg/m   Physical Exam HEENT: Ears with minimal cerumen. EXTREMITIES: Left foot warm, no pus, scab on big toe.  Wt Readings from Last 3 Encounters:  03/21/24 193 lb 12.8 oz (87.9 kg)  02/27/24 192 lb (87.1 kg)  02/27/24 186 lb (84.4 kg)    Physical Exam Vitals reviewed.  Constitutional:      General: He is not in acute distress.    Appearance: Normal appearance. He is obese. He is not ill-appearing, toxic-appearing or diaphoretic.  HENT:     Head: Normocephalic.     Right Ear: Tympanic membrane normal.     Left Ear: Tympanic membrane normal.     Nose: Nose normal.     Mouth/Throat:     Mouth: Mucous membranes are moist.  Eyes:     Pupils: Pupils are equal, round, and reactive to light.  Cardiovascular:  Rate and Rhythm: Normal rate and regular rhythm.  Pulmonary:     Effort: Pulmonary effort is normal.     Breath sounds: Normal breath sounds. No wheezing.  Musculoskeletal:        General: Normal range of motion.     Cervical back: Normal range of motion.     Left lower leg: 2+ Pitting Edema present.  Skin:    Comments: Left anterior great toe with erythema and warmth to great toe with scaling on a lesion with slight clear drainage   Neurological:     General: No focal deficit present.     Mental Status: He is alert and oriented to person, place, and time. Mental status is at baseline.  Psychiatric:        Mood and Affect: Mood normal.        Behavior: Behavior normal.        Thought Content: Thought content normal.         Judgment: Judgment normal.        Wt Readings from Last 3 Encounters:  03/21/24 193 lb 12.8 oz (87.9 kg)  02/27/24 192 lb (87.1 kg)  02/27/24 186 lb (84.4 kg)        Results Labs A1c (11/2023): 6.6  Assessment & Plan:   Assessment and Plan Assessment & Plan Cellulitis and chronic ulcer of the left great toe Chronic ulcer on the left great toe with intermittent scabbing and warmth, indicating possible infection. No current pus or significant drainage. Previous antibiotic treatment was beneficial. - Prescribed doxycycline  for suspected infection of the left great toe. - Monitor for signs of infection such as increased redness, warmth, or pus. - Follow up with podiatrist Dr. Tobie for ongoing management.  Chronic painful diabetic neuropathy Chronic neuropathic pain in both feet, primarily at night, causing burning and pain. Gabapentin  100 mg at night causes morning loopy feeling. Previous treatments have been ineffective. Concerns about potential nerve damage from further treatments. - Continue gabapentin  100 mg at night. - Follow up with Dr. Lateef for further management of neuropathic pain.  Chronic cough with allergic rhinitis Chronic cough persisting for a couple of months, improved with Mucinex but still present at night with wheezing. No asthma history. Zyrtec was stopped due to perceived cold symptoms. Singulair  is considered for persistent symptoms. - Continue Zyrtec over the counter. - Started Singulair  nightly for persistent cough and allergic rhinitis. -could continue valsartan   - Instructed to monitor for mood changes as a potential side effect of Singulair .  Elevated brain natriuretic peptide (BNP) level Elevated BNP level indicating possible heart failure. No recent cardiology follow-up. Reports no chest tightness or significant shortness of breath. - Repeated BNP and other labs to monitor levels. - Encouraged follow-up with cardiologist for further  evaluation.  Serum potassium elevated (hyperkalemia) Elevated serum potassium level. Not taking potassium supplements. Valsartan  may contribute to elevated potassium levels. - Repeated serum potassium level to monitor. - Continue current medications and monitor for symptoms of hyperkalemia.        Return if symptoms worsen or fail to improve.     Ginger Patrick, MSN, APRN, FNP-C [facility]        [1]  Current Outpatient Medications:    celecoxib  (CELEBREX ) 100 MG capsule, Take 100 mg by mouth 2 (two) times daily., Disp: , Rfl:    diclofenac  Sodium (VOLTAREN ) 1 % GEL, Apply 2 g topically at bedtime., Disp: 100 g, Rfl: 2   doxycycline  (VIBRA -TABS) 100 MG  tablet, Take 1 tablet (100 mg total) by mouth 2 (two) times daily for 10 days., Disp: 20 tablet, Rfl: 0   fexofenadine  (ALLEGRA ) 180 MG tablet, TAKE 1 TABLET BY MOUTH EVERY DAY, Disp: 90 tablet, Rfl: 1   furosemide  (LASIX ) 20 MG tablet, TAKE 1 TABLET (20 MG TOTAL) BY MOUTH DAILY. TAKE 1 TABLET (20 MG TOTAL) BY MOUTH DAILY FOR 7 DAYS. (Patient taking differently: Take by mouth daily as needed. Take 1 tablet (20 mg total) by mouth daily for 7 days.), Disp: 90 tablet, Rfl: 2   gabapentin  (NEURONTIN ) 100 MG capsule, Take 1 capsule (100 mg total) by mouth 3 (three) times daily. (Patient taking differently: Take 100 mg by mouth 3 (three) times daily as needed.), Disp: 90 capsule, Rfl: 3   Hypromellose (ARTIFICIAL TEARS OP), Place 1 drop into both eyes 2 (two) times daily., Disp: , Rfl:    montelukast  (SINGULAIR ) 10 MG tablet, Take 1 tablet (10 mg total) by mouth at bedtime., Disp: 30 tablet, Rfl: 3   Multiple Vitamin (MULTIVITAMIN PO), Take by mouth daily., Disp: , Rfl:    Omega-3 Fatty Acids (FISH OIL) 1000 MG CAPS, Take 1,000 mg by mouth every evening., Disp: , Rfl:    omeprazole  (PRILOSEC) 20 MG capsule, TAKE 1 CAPSULE BY MOUTH EVERY DAY, Disp: 90 capsule, Rfl: 1   rosuvastatin  (CRESTOR ) 5 MG tablet, TAKE 1  TABLET AT BEDTIME, Disp: 90 tablet, Rfl: 1   SYNTHROID  137 MCG tablet, TAKE 1 TABLET DAILY BEFORE BREAKFAST, Disp: 90 tablet, Rfl: 3   tamsulosin  (FLOMAX ) 0.4 MG CAPS capsule, TAKE 1 CAPSULE TWICE A DAY, Disp: 180 capsule, Rfl: 3   valsartan  (DIOVAN ) 320 MG tablet, TAKE 1 TABLET BY MOUTH EVERY DAY, Disp: 90 tablet, Rfl: 3  "

## 2024-03-22 ENCOUNTER — Other Ambulatory Visit: Payer: Self-pay | Admitting: Radiology

## 2024-03-22 ENCOUNTER — Ambulatory Visit: Admitting: Podiatry

## 2024-03-22 ENCOUNTER — Ambulatory Visit: Payer: Self-pay | Admitting: Family

## 2024-03-22 DIAGNOSIS — M19072 Primary osteoarthritis, left ankle and foot: Secondary | ICD-10-CM | POA: Diagnosis not present

## 2024-03-22 DIAGNOSIS — D649 Anemia, unspecified: Secondary | ICD-10-CM

## 2024-03-22 NOTE — Progress Notes (Signed)
 "  Subjective:  Patient ID: Kyle Simon, male    DOB: Jul 25, 1932,  MRN: [id number]  Chief Complaint  Patient presents with   Toe Pain    Left foot big toe  Would like nails trimmed as well     89 y.o. male presents with the above complaint.  Patient presents with complaint of edema to the left second digit.  No open wounds or lesion he just wanted make sure that the ulceration is not coming back he wants to make sure the edema is not a problematic thing he is a type II diabetic.  He would like to discuss treatment options for this pain scale 7 out of 10 dull aching nature   Review of Systems: Negative except as noted in the HPI. Denies N/V/F/Ch.  Past Medical History:  Diagnosis Date   Arthritis    both hands;Dr.Deveshwar   Cataract 2010   Constipation due to pain medication    Diabetes mellitus without complication (HCC)    Type II. Uses no medication. Pt controls with diet and weight   Enlarged prostate    Frequent urination at night    GERD (gastroesophageal reflux disease)    History of noncompliance with medical treatment, presenting hazards to health 11/20/2018   Hyperlipidemia associated with type 2 diabetes mellitus (HCC) 04/19/2007   Qualifier: Diagnosis of  By: Tish MD, Elsie     Hypertension    Hypothyroidism    Polio    as a child w/o complications   Snoring    no sleep apnea   Statin declined-  11/20/2018   patient understands risks associated with this decision and has been advised to restart by cardiologist as well as myself several times   Torn meniscus    Transfusion history 2012   post TKR    Current Outpatient Medications:    celecoxib  (CELEBREX ) 100 MG capsule, Take 100 mg by mouth 2 (two) times daily., Disp: , Rfl:    diclofenac  Sodium (VOLTAREN ) 1 % GEL, Apply 2 g topically at bedtime., Disp: 100 g, Rfl: 2   doxycycline  (VIBRA -TABS) 100 MG tablet, Take 1 tablet (100 mg total) by mouth 2 (two) times daily for 10 days., Disp: 20 tablet, Rfl: 0    fexofenadine  (ALLEGRA ) 180 MG tablet, TAKE 1 TABLET BY MOUTH EVERY DAY, Disp: 90 tablet, Rfl: 1   furosemide  (LASIX ) 20 MG tablet, TAKE 1 TABLET (20 MG TOTAL) BY MOUTH DAILY. TAKE 1 TABLET (20 MG TOTAL) BY MOUTH DAILY FOR 7 DAYS. (Patient taking differently: Take by mouth daily as needed. Take 1 tablet (20 mg total) by mouth daily for 7 days.), Disp: 90 tablet, Rfl: 2   gabapentin  (NEURONTIN ) 100 MG capsule, Take 1 capsule (100 mg total) by mouth 3 (three) times daily. (Patient taking differently: Take 100 mg by mouth 3 (three) times daily as needed.), Disp: 90 capsule, Rfl: 3   Hypromellose (ARTIFICIAL TEARS OP), Place 1 drop into both eyes 2 (two) times daily., Disp: , Rfl:    montelukast  (SINGULAIR ) 10 MG tablet, Take 1 tablet (10 mg total) by mouth at bedtime., Disp: 30 tablet, Rfl: 3   Multiple Vitamin (MULTIVITAMIN PO), Take by mouth daily., Disp: , Rfl:    Omega-3 Fatty Acids (FISH OIL) 1000 MG CAPS, Take 1,000 mg by mouth every evening., Disp: , Rfl:    omeprazole  (PRILOSEC) 20 MG capsule, TAKE 1 CAPSULE BY MOUTH EVERY DAY, Disp: 90 capsule, Rfl: 1   rosuvastatin  (CRESTOR ) 5 MG tablet, TAKE 1  TABLET AT BEDTIME, Disp: 90 tablet, Rfl: 1   SYNTHROID  137 MCG tablet, TAKE 1 TABLET DAILY BEFORE BREAKFAST, Disp: 90 tablet, Rfl: 3   tamsulosin  (FLOMAX ) 0.4 MG CAPS capsule, TAKE 1 CAPSULE TWICE A DAY, Disp: 180 capsule, Rfl: 3   valsartan  (DIOVAN ) 320 MG tablet, TAKE 1 TABLET BY MOUTH EVERY DAY, Disp: 90 tablet, Rfl: 3  Social History   Tobacco Use  Smoking Status Former   Current packs/day: 0.00   Average packs/day: 1 pack/day for 18.0 years (18.0 ttl pk-yrs)   Types: Cigarettes   Start date: 03/01/1948   Quit date: 03/01/1966   Years since quitting: 58.0   Passive exposure: Never  Smokeless Tobacco Never    Allergies  Allergen Reactions   Simvastatin Other (See Comments)    MYALGIAS   Oxycodone  Other (See Comments)    Severe constipation   Objective:  There were no vitals filed for  this visit. There is no height or weight on file to calculate BMI. Constitutional Well developed. Well nourished.  Vascular Dorsalis pedis pulses palpable bilaterally. Posterior tibial pulses palpable bilaterally. Capillary refill normal to all digits.  No cyanosis or clubbing noted. Pedal hair growth normal.  Neurologic Normal speech. Oriented to person, place, and time. Epicritic sensation to light touch grossly present bilaterally.  Dermatologic Left hallux edema with some achiness.  Pain with range of motion of the interphalangeal joint mild crepitus noted no open wounds noted at this time preulcerative callus noted.  Orthopedic: Normal joint ROM without pain or crepitus bilaterally. No visible deformities. No bony tenderness.   Radiographs: None Assessment:   No diagnosis found.  Plan:  Patient was evaluated and treated and all questions answered.  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  He will work on that immediately - If there is no improvement patient may need interphalangeal joint fusion in the future. I discussed with them if an ulceration happens to come see me right away they state understanding.-  No follow-ups on file. "

## 2024-03-23 LAB — FECAL OCCULT BLOOD, IMMUNOCHEMICAL: Fecal Occult Bld: NEGATIVE

## 2024-03-28 ENCOUNTER — Other Ambulatory Visit: Payer: Self-pay | Admitting: Family

## 2024-03-28 DIAGNOSIS — R053 Chronic cough: Secondary | ICD-10-CM

## 2024-03-29 ENCOUNTER — Ambulatory Visit: Payer: Self-pay

## 2024-03-29 DIAGNOSIS — R600 Localized edema: Secondary | ICD-10-CM | POA: Insufficient documentation

## 2024-03-29 NOTE — Telephone Encounter (Signed)
 FYI Only or Action Required?: Action required by provider: request for appointment.  Patient was last seen in primary care on 03/21/2024 by Corwin Antu, FNP.  Called Nurse Triage reporting Cough.  Symptoms began ongoing has been seen by pcp for this.  Interventions attempted: Prescription medications: various medications.  Symptoms are: gradually worsening.  Triage Disposition: See HCP Within 4 Hours (Or PCP Triage)  Patient/caregiver understands and will follow disposition?: Yes, will follow disposition  Reason for Triage: worsening cough, wheezing    Reason for Disposition  Wheezing is present  Answer Assessment - Initial Assessment Questions 1. ONSET: When did the cough begin?      Ongoing issue, seen by PCP for this 2. SEVERITY: How bad is the cough today?      moderate 3. SPUTUM: Describe the color of your sputum (e.g., none, dry cough; clear, white, yellow, green)     denies 4. HEMOPTYSIS: Are you coughing up any blood? If Yes, ask: How much? (e.g., flecks, streaks, tablespoons, etc.)     denies 5. DIFFICULTY BREATHING: Are you having difficulty breathing? If Yes, ask: How bad is it? (e.g., mild, moderate, severe)      Only with exertion, mild then 6. FEVER: Do you have a fever? If Yes, ask: What is your temperature, how was it measured, and when did it start?     denies 10. OTHER SYMPTOMS: Do you have any other symptoms? (e.g., runny nose, wheezing, chest pain)       Wheezing with cough  Only willing to see PCP. Routing for appt.  Protocols used: Cough - Acute Productive-A-AH

## 2024-03-29 NOTE — Telephone Encounter (Signed)
 It doesn't look as though I have any availabily and I do not want him to wait the weekend if he is wheezing as he was not wheezing last time I saw him.

## 2024-03-29 NOTE — Progress Notes (Signed)
 " Cardiology Office Note:   Date:  03/30/2024  ID:  COLIE FUGITT, DOB 1932/07/19, MRN [id number] PCP: Corwin Antu, FNP  [facility] Providers Cardiologist:  Lynwood Schilling, MD {  History of Present Illness:   Kyle Simon is a 89 y.o. male who presents for follow up with SOB and abnormal EKG.   He had a low risk stress echo in 2012.  He had a distant cardiac cath.   He had dyspnea at a previous visit and I sent him for a Lexiscan  Myoview .  This was negative for ischemia.     Since I last saw him he has developed a cough.  He reports he has had a dry nonproductive cough.  He is not describing PND or orthopnea.  He is not really having any new shortness of breath.  He gets around slowly with a cane.  He did have a BNP that was minimally elevated at 143.  He came down from late December at that level to be 102 when check 9 days ago.  He did have a chest x-ray this morning.  I have not seen an official reading but I looked at it and there does not appear to be any pneumonia or pulmonary edema.  There are no pleural effusions.  He is here today to follow-up on the BNP and the cough.  He is not having any chest pressure, neck or arm discomfort.  He has had a little lower extremity edema that he controls with diuretic.  He is not having any weight gain.   He does complain of sinus drainage and stuffiness.  He has been told he had nasal polyps before.  ROS: Positive for neuropathy. Otherwise as stated in the HPI and negative for all other systems.   Studies Reviewed:    EKG:       Risk Assessment/Calculations:              Physical Exam:   VS:  BP 127/70 (BP Location: Right Arm, Patient Position: Sitting, Cuff Size: Normal)   Pulse (!) 55   Ht 5' 10 (1.778 m)   Wt 194 lb (88 kg)   SpO2 99%   BMI 27.84 kg/m    Wt Readings from Last 3 Encounters:  03/30/24 194 lb (88 kg)  03/30/24 194 lb (88 kg)  03/21/24 193 lb 12.8 oz (87.9 kg)     GEN: Well nourished, well developed in  no acute distress NECK: No JVD; No carotid bruits CARDIAC: RRR, soft 2 out of 6 apical systolic murmur nonradiating, no diastolic murmurs, rubs, gallops RESPIRATORY:  Clear to auscultation without rales, wheezing or rhonchi  ABDOMEN: Soft, non-tender, non-distended EXTREMITIES:  No edema; No deformity   ASSESSMENT AND PLAN:   EDEMA:   He has some mild edema that is unchanged and manageable with diuretics.  No change in therapy.   HTN:  The blood pressure is at target.  No change in therapy.    AORTIC ATHEROSCLEROSIS:    He will continue with primary risk reduction.  No change in therapy.  COUGH:   His EF was well-preserved on echo last May.  He has only mild aortic stenosis.  His BNP was not diagnostic.  Chest x-ray was clear.  I do not think he has pulmonary edema or heart failure as a cause.  I would suspect this is likely related to sinus issues.  I have asked him to see ENT.   Follow up with me in 1  year or sooner if needed.  Signed, Lynwood Schilling, MD   "

## 2024-03-29 NOTE — Telephone Encounter (Addendum)
 LM for pt to return my call. Per Ginger, pt will need to be seen. She does not have any availability. Pt will need to be scheduled with another provider here in our office.  Please provide message from provider/office when call is returned from patient.

## 2024-03-30 ENCOUNTER — Ambulatory Visit: Attending: Internal Medicine | Admitting: Cardiology

## 2024-03-30 ENCOUNTER — Ambulatory Visit: Admitting: Family Medicine

## 2024-03-30 ENCOUNTER — Encounter: Payer: Self-pay | Admitting: Cardiology

## 2024-03-30 ENCOUNTER — Encounter: Payer: Self-pay | Admitting: Family Medicine

## 2024-03-30 ENCOUNTER — Ambulatory Visit
Admission: RE | Admit: 2024-03-30 | Discharge: 2024-03-30 | Disposition: A | Source: Ambulatory Visit | Attending: Family Medicine | Admitting: Family Medicine

## 2024-03-30 VITALS — BP 127/70 | HR 55 | Ht 70.0 in | Wt 194.0 lb

## 2024-03-30 VITALS — BP 120/80 | HR 65 | Temp 98.4°F | Ht 70.0 in | Wt 194.0 lb

## 2024-03-30 DIAGNOSIS — I10 Essential (primary) hypertension: Secondary | ICD-10-CM | POA: Insufficient documentation

## 2024-03-30 DIAGNOSIS — E1169 Type 2 diabetes mellitus with other specified complication: Secondary | ICD-10-CM | POA: Diagnosis not present

## 2024-03-30 DIAGNOSIS — R052 Subacute cough: Secondary | ICD-10-CM | POA: Diagnosis not present

## 2024-03-30 DIAGNOSIS — R600 Localized edema: Secondary | ICD-10-CM | POA: Diagnosis present

## 2024-03-30 DIAGNOSIS — E785 Hyperlipidemia, unspecified: Secondary | ICD-10-CM | POA: Insufficient documentation

## 2024-03-30 DIAGNOSIS — I700 Atherosclerosis of aorta: Secondary | ICD-10-CM | POA: Insufficient documentation

## 2024-03-30 DIAGNOSIS — R053 Chronic cough: Secondary | ICD-10-CM

## 2024-03-30 NOTE — Patient Instructions (Signed)

## 2024-03-30 NOTE — Assessment & Plan Note (Addendum)
 Ongoing dry cough, may be becoming more productive.  He notes some wheezing to throat but I don't hear any wheezing on exam.  RLL coarse crackles/rales that do improve with deep cough ?atx - update CXR given persistent symptoms.  With murmur, pedal edema, exertional dyspnea, need to r/o cardiac cause (worsening valvular dysfunction vs CHF) as contributor however latest echo 08/2023 was overall reassuring. Glad he has cardiology appt this afternoon for further eval.  He doesn't feel singulair  trial has been too beneficial (over the past week). He did not take doxycycline  course.  Remote h/o PNA, lung issues from agent orange exposure - but no recent trouble or known h/o asthma, COPD.  Update CXR today.  He finds mucinex has been the most helpful - ok to continue this with large glass of water  to help mobilize/expectorate mucous.  He's unsure if taking PPI - suggested trial of omeprazole  20mg  daily for 1-2 wks  Suggested trial of OTC flonase  nasal steroid, hold for nosebleeds. Update if ongoing symptoms, consider CT chest vs pulmonology eval.  No signs of ongoing bacterial or other infection at this time.

## 2024-03-30 NOTE — Patient Instructions (Addendum)
 Chest xray today  Restart plain mucinex or fast relief guaifenesin with large glass of water  to help mobilize mucous.  Keep cardiology appointment this afternoon.  If ongoing cough, we may consider seeing a lung doctor.  Good to see you today.

## 2024-03-30 NOTE — Telephone Encounter (Signed)
 LM for pt to return my call

## 2024-04-02 ENCOUNTER — Ambulatory Visit: Payer: Self-pay | Admitting: Family Medicine

## 2024-04-05 ENCOUNTER — Encounter: Payer: Self-pay | Admitting: Family

## 2024-04-05 ENCOUNTER — Ambulatory Visit: Admitting: Family

## 2024-04-05 VITALS — BP 122/64 | HR 74 | Temp 98.1°F | Ht 70.0 in | Wt 194.0 lb

## 2024-04-05 DIAGNOSIS — R053 Chronic cough: Secondary | ICD-10-CM | POA: Insufficient documentation

## 2024-04-05 DIAGNOSIS — E1140 Type 2 diabetes mellitus with diabetic neuropathy, unspecified: Secondary | ICD-10-CM

## 2024-04-05 DIAGNOSIS — E039 Hypothyroidism, unspecified: Secondary | ICD-10-CM

## 2024-04-05 DIAGNOSIS — J324 Chronic pansinusitis: Secondary | ICD-10-CM

## 2024-04-05 DIAGNOSIS — R0609 Other forms of dyspnea: Secondary | ICD-10-CM | POA: Insufficient documentation

## 2024-04-05 DIAGNOSIS — E1169 Type 2 diabetes mellitus with other specified complication: Secondary | ICD-10-CM

## 2024-04-05 MED ORDER — FLUTICASONE PROPIONATE 50 MCG/ACT NA SUSP: NASAL | 5 refills | Status: AC

## 2024-04-05 MED ORDER — ROSUVASTATIN CALCIUM 5 MG PO TABS: 5.0000 mg | ORAL_TABLET | Freq: Every day | ORAL | 3 refills | Status: AC

## 2024-04-05 MED ORDER — CELECOXIB 100 MG PO CAPS: 100.0000 mg | ORAL_CAPSULE | Freq: Two times a day (BID) | ORAL | 2 refills | Status: AC

## 2024-04-05 MED ORDER — LEVOTHYROXINE SODIUM 137 MCG PO TABS: 137.0000 ug | ORAL_TABLET | Freq: Every day | ORAL | 3 refills | Status: AC

## 2024-04-05 NOTE — Progress Notes (Signed)
 "  Established Patient Office Visit  Subjective:      CC:  Chief Complaint  Patient presents with   Cough    HPI: Kyle Simon is a 89 y.o. male presenting on 04/05/2024 for Cough .  Discussed the use of AI scribe software for clinical note transcription with the patient, who gave verbal consent to proceed.  History of Present Illness Kyle Simon is an 89 year old male with heart failure who presents with a persistent cough.  He has a persistent dry cough that has been ongoing for several months, which has become more productive over time. The cough is described as 'juicier' now and worsens after eating. He has no sinus pressure or facial pain but mentions a blocked nostril due to a polyp that occasionally bleeds. He has a history of exposure to Agent Orange and remote pneumonia.  He has been evaluated by his cardiologist, who did not think heart failure or pulmonary edema were the cause of the cough, according to the patient's recollection. A chest x-ray was performed and was clear. He has been taking omeprazole  for a couple of weeks without improvement and continues to take montelukast  and Allegra . He has not started Flonase . He finds Xanax helpful but has run out of it.  He experiences shortness of breath upon exertion but not at rest. He has a history of mild aortic stenosis and preserved ejection fraction. He has been on valsartan  for a long time. No recent cold or sinus infection symptoms are present, but he mentions fluid in his ears.          Social history:  Relevant past medical, surgical, family and social history reviewed and updated as indicated. Interim medical history since our last visit reviewed.  Allergies and medications reviewed and updated.  DATA REVIEWED: CHART IN EPIC     ROS: Negative unless specifically indicated above in HPI.   Current Medications[1]        Objective:        BP 122/64 (BP Location: Left Arm, Patient  Position: Sitting, Cuff Size: Large)   Pulse 74   Temp 98.1 F (36.7 C) (Temporal)   Ht 5' 10 (1.778 m)   Wt 194 lb (88 kg)   SpO2 98%   BMI 27.84 kg/m   Physical Exam HEENT: Accumulated mucus and blood in nostril. Fluid behind ear, not infected. CHEST: Coarse crackles improved with deep cough.  Wt Readings from Last 3 Encounters:  04/05/24 194 lb (88 kg)  03/30/24 194 lb (88 kg)  03/30/24 194 lb (88 kg)    Physical Exam Vitals reviewed.  Constitutional:      General: He is not in acute distress.    Appearance: Normal appearance. He is obese. He is not ill-appearing, toxic-appearing or diaphoretic.  HENT:     Head: Normocephalic.     Right Ear: Tympanic membrane normal.     Left Ear: Tympanic membrane normal.     Nose: Nose normal.     Mouth/Throat:     Mouth: Mucous membranes are moist.  Eyes:     Pupils: Pupils are equal, round, and reactive to light.  Cardiovascular:     Rate and Rhythm: Normal rate and regular rhythm.  Pulmonary:     Effort: Pulmonary effort is normal.     Breath sounds: Normal breath sounds. No wheezing.  Musculoskeletal:        General: Normal range of motion.     Cervical back: Normal range  of motion.  Neurological:     General: No focal deficit present.     Mental Status: He is alert and oriented to person, place, and time. Mental status is at baseline.  Psychiatric:        Mood and Affect: Mood normal.        Behavior: Behavior normal.        Thought Content: Thought content normal.        Judgment: Judgment normal.          Results Labs BNP (03/30/2024): Not diagnostic Kidney function: Within normal limits  Radiology Chest X-ray (03/30/2024): Clear; no pulmonary edema or congestive heart failure  Diagnostic Echocardiogram (03/30/2024): Preserved ejection fraction; mild aortic stenosis  Assessment & Plan:   Assessment and Plan Assessment & Plan Chronic cough with chronic pansinusitis and allergic rhinitis Persistent  dry cough for several months, now more productive. Cardiologist ruled out heart failure and pulmonary edema. Chest x-ray clear. ENT suggested sinus issues. Omeprazole  trial ineffective. Montelukast  and Allegra  ongoing. No sinus pressure or facial pain. Fluid in ears without infection. Possible allergic component. ENT evaluation recommended. Continue allegra   Start flonase  Continue singulair   -reviewed cxr 1/30 negative  -trial PPI unsuccessful  -cardiology weighed in, doesn't feel r/t heart failure  -ordering chest CT wo contrast pending  -Consider referral to pulmonary if CT negative -Advised pt to reach out to ENT as well as this could be contributing -consider ARB however been on for years without issues   Dyspnea on exertion Shortness of breath with exertion, not worsening. No significant findings on chest x-ray. Possible contribution from chronic cough and sinus issues. - Continue to monitor symptoms and will reassess after chest CT results.     Return in about 1 month (around 05/03/2024) for f/u cough .     Ginger Patrick, MSN, APRN, FNP-C [place] [facility]        [1]  Current Outpatient Medications:    diclofenac  Sodium (VOLTAREN ) 1 % GEL, Apply 2 g topically at bedtime., Disp: 100 g, Rfl: 2   fexofenadine  (ALLEGRA ) 180 MG tablet, TAKE 1 TABLET BY MOUTH EVERY DAY, Disp: 90 tablet, Rfl: 1   fluticasone  (FLONASE ) 50 MCG/ACT nasal spray, 2 sprays bil nares once daily, Disp: 16 g, Rfl: 5   furosemide  (LASIX ) 20 MG tablet, TAKE 1 TABLET (20 MG TOTAL) BY MOUTH DAILY. TAKE 1 TABLET (20 MG TOTAL) BY MOUTH DAILY FOR 7 DAYS. (Patient taking differently: Take by mouth daily as needed. Take 1 tablet (20 mg total) by mouth daily for 7 days.), Disp: 90 tablet, Rfl: 2   gabapentin  (NEURONTIN ) 100 MG capsule, Take 1 capsule (100 mg total) by mouth 3 (three) times daily. (Patient taking differently: Take 100 mg by mouth 3 (three) times daily as needed.), Disp: 90 capsule,  Rfl: 3   Hypromellose (ARTIFICIAL TEARS OP), Place 1 drop into both eyes 2 (two) times daily., Disp: , Rfl:    montelukast  (SINGULAIR ) 10 MG tablet, Take 1 tablet (10 mg total) by mouth at bedtime., Disp: 30 tablet, Rfl: 3   Multiple Vitamin (MULTIVITAMIN PO), Take by mouth daily., Disp: , Rfl:    Omega-3 Fatty Acids (FISH OIL) 1000 MG CAPS, Take 1,000 mg by mouth every evening., Disp: , Rfl:    omeprazole  (PRILOSEC) 20 MG capsule, TAKE 1 CAPSULE BY MOUTH EVERY DAY, Disp: 90 capsule, Rfl: 1   tamsulosin  (FLOMAX ) 0.4 MG CAPS capsule, TAKE 1 CAPSULE TWICE A DAY, Disp: 180 capsule, Rfl: 3  valsartan  (DIOVAN ) 320 MG tablet, TAKE 1 TABLET BY MOUTH EVERY DAY, Disp: 90 tablet, Rfl: 3   celecoxib  (CELEBREX ) 100 MG capsule, Take 1 capsule (100 mg total) by mouth 2 (two) times daily., Disp: 60 capsule, Rfl: 2   levothyroxine  (SYNTHROID ) 137 MCG tablet, Take 1 tablet (137 mcg total) by mouth daily before breakfast., Disp: 90 tablet, Rfl: 3   rosuvastatin  (CRESTOR ) 5 MG tablet, Take 1 tablet (5 mg total) by mouth at bedtime., Disp: 90 tablet, Rfl: 3  "

## 2024-04-05 NOTE — Patient Instructions (Signed)
" °  Call ENT to get scheduled to evaluate the cough further  Start trial flonase   2 sprays each side of the nose once daily  Stop if you start to get nosebleeds CT chest ordered pending insurance  If CT chest negative I will refer to pulmonary  "

## 2024-04-24 ENCOUNTER — Ambulatory Visit: Admitting: Student in an Organized Health Care Education/Training Program

## 2024-06-18 ENCOUNTER — Other Ambulatory Visit

## 2024-06-20 ENCOUNTER — Ambulatory Visit: Admitting: Endocrinology

## 2024-06-25 ENCOUNTER — Ambulatory Visit: Admitting: Endocrinology

## 2024-08-06 ENCOUNTER — Encounter (INDEPENDENT_AMBULATORY_CARE_PROVIDER_SITE_OTHER): Admitting: Ophthalmology

## 2024-09-13 ENCOUNTER — Ambulatory Visit

## 2024-09-14 ENCOUNTER — Ambulatory Visit

## 2024-09-20 ENCOUNTER — Ambulatory Visit: Admitting: Rheumatology
